# Patient Record
Sex: Female | Born: 1965 | Race: White | Hispanic: No | Marital: Married | Smoking: Never smoker
Health system: Southern US, Community
[De-identification: ages and names within clinical notes are randomized; demographics above are authoritative.]

## PROBLEM LIST (undated history)

## (undated) DIAGNOSIS — M545 Low back pain: Secondary | ICD-10-CM

## (undated) DIAGNOSIS — F32A Depression, unspecified: Secondary | ICD-10-CM

## (undated) DIAGNOSIS — R509 Fever, unspecified: Secondary | ICD-10-CM

## (undated) DIAGNOSIS — G4730 Sleep apnea, unspecified: Secondary | ICD-10-CM

## (undated) DIAGNOSIS — G709 Myoneural disorder, unspecified: Secondary | ICD-10-CM

## (undated) DIAGNOSIS — R0781 Pleurodynia: Secondary | ICD-10-CM

## (undated) DIAGNOSIS — R51 Headache: Secondary | ICD-10-CM

## (undated) DIAGNOSIS — E785 Hyperlipidemia, unspecified: Secondary | ICD-10-CM

## (undated) DIAGNOSIS — K529 Noninfective gastroenteritis and colitis, unspecified: Secondary | ICD-10-CM

## (undated) DIAGNOSIS — E669 Obesity, unspecified: Secondary | ICD-10-CM

## (undated) DIAGNOSIS — G629 Polyneuropathy, unspecified: Secondary | ICD-10-CM

## (undated) DIAGNOSIS — J189 Pneumonia, unspecified organism: Secondary | ICD-10-CM

## (undated) DIAGNOSIS — M1990 Unspecified osteoarthritis, unspecified site: Secondary | ICD-10-CM

## (undated) DIAGNOSIS — M14671 Charcot's joint, right ankle and foot: Secondary | ICD-10-CM

## (undated) DIAGNOSIS — R0609 Other forms of dyspnea: Secondary | ICD-10-CM

## (undated) DIAGNOSIS — E119 Type 2 diabetes mellitus without complications: Secondary | ICD-10-CM

## (undated) DIAGNOSIS — K219 Gastro-esophageal reflux disease without esophagitis: Secondary | ICD-10-CM

## (undated) DIAGNOSIS — L97509 Non-pressure chronic ulcer of other part of unspecified foot with unspecified severity: Secondary | ICD-10-CM

## (undated) DIAGNOSIS — R011 Cardiac murmur, unspecified: Secondary | ICD-10-CM

## (undated) DIAGNOSIS — D649 Anemia, unspecified: Secondary | ICD-10-CM

## (undated) HISTORY — DX: Low back pain: M54.5

## (undated) HISTORY — PX: REPLACEMENT TOTAL KNEE: SUR1224

## (undated) HISTORY — DX: Pleurodynia: R07.81

## (undated) HISTORY — PX: KNEE ARTHROSCOPY: SHX127

## (undated) HISTORY — DX: Depression, unspecified: F32.A

## (undated) HISTORY — DX: Other forms of dyspnea: R06.09

## (undated) HISTORY — DX: Fever, unspecified: R50.9

---

## 2001-06-24 ENCOUNTER — Other Ambulatory Visit: Admission: RE | Admit: 2001-06-24 | Discharge: 2001-06-24 | Payer: Self-pay | Admitting: Obstetrics & Gynecology

## 2001-06-30 ENCOUNTER — Encounter: Admission: RE | Admit: 2001-06-30 | Discharge: 2001-06-30 | Payer: Self-pay | Admitting: Obstetrics & Gynecology

## 2001-06-30 ENCOUNTER — Encounter: Payer: Self-pay | Admitting: Obstetrics & Gynecology

## 2002-01-25 ENCOUNTER — Other Ambulatory Visit: Admission: RE | Admit: 2002-01-25 | Discharge: 2002-01-25 | Payer: Self-pay | Admitting: Obstetrics and Gynecology

## 2002-04-07 ENCOUNTER — Ambulatory Visit (HOSPITAL_BASED_OUTPATIENT_CLINIC_OR_DEPARTMENT_OTHER): Admission: RE | Admit: 2002-04-07 | Discharge: 2002-04-07 | Payer: Self-pay | Admitting: Orthopaedic Surgery

## 2002-07-27 ENCOUNTER — Other Ambulatory Visit: Admission: RE | Admit: 2002-07-27 | Discharge: 2002-07-27 | Payer: Self-pay | Admitting: Obstetrics & Gynecology

## 2003-08-09 ENCOUNTER — Other Ambulatory Visit: Admission: RE | Admit: 2003-08-09 | Discharge: 2003-08-09 | Payer: Self-pay | Admitting: Obstetrics & Gynecology

## 2004-10-24 ENCOUNTER — Other Ambulatory Visit: Admission: RE | Admit: 2004-10-24 | Discharge: 2004-10-24 | Payer: Self-pay | Admitting: Obstetrics & Gynecology

## 2007-04-26 ENCOUNTER — Inpatient Hospital Stay (HOSPITAL_COMMUNITY): Admission: RE | Admit: 2007-04-26 | Discharge: 2007-04-29 | Payer: Self-pay | Admitting: Orthopaedic Surgery

## 2010-04-24 ENCOUNTER — Encounter (HOSPITAL_BASED_OUTPATIENT_CLINIC_OR_DEPARTMENT_OTHER)
Admission: RE | Admit: 2010-04-24 | Discharge: 2010-05-27 | Payer: Self-pay | Source: Home / Self Care | Attending: Internal Medicine | Admitting: Internal Medicine

## 2010-04-24 ENCOUNTER — Ambulatory Visit (HOSPITAL_COMMUNITY)
Admission: RE | Admit: 2010-04-24 | Discharge: 2010-04-24 | Payer: Self-pay | Source: Home / Self Care | Attending: Internal Medicine | Admitting: Internal Medicine

## 2010-05-01 ENCOUNTER — Ambulatory Visit (HOSPITAL_COMMUNITY)
Admission: RE | Admit: 2010-05-01 | Discharge: 2010-05-01 | Payer: Self-pay | Source: Home / Self Care | Attending: Internal Medicine | Admitting: Internal Medicine

## 2010-05-29 ENCOUNTER — Encounter (HOSPITAL_BASED_OUTPATIENT_CLINIC_OR_DEPARTMENT_OTHER): Payer: BC Managed Care – PPO | Attending: Internal Medicine

## 2010-05-29 DIAGNOSIS — L97509 Non-pressure chronic ulcer of other part of unspecified foot with unspecified severity: Secondary | ICD-10-CM | POA: Insufficient documentation

## 2010-05-29 DIAGNOSIS — Z8673 Personal history of transient ischemic attack (TIA), and cerebral infarction without residual deficits: Secondary | ICD-10-CM | POA: Insufficient documentation

## 2010-05-29 DIAGNOSIS — A4901 Methicillin susceptible Staphylococcus aureus infection, unspecified site: Secondary | ICD-10-CM | POA: Insufficient documentation

## 2010-05-29 DIAGNOSIS — M869 Osteomyelitis, unspecified: Secondary | ICD-10-CM | POA: Insufficient documentation

## 2010-05-29 DIAGNOSIS — Z96659 Presence of unspecified artificial knee joint: Secondary | ICD-10-CM | POA: Insufficient documentation

## 2010-06-26 ENCOUNTER — Encounter (HOSPITAL_BASED_OUTPATIENT_CLINIC_OR_DEPARTMENT_OTHER): Payer: BC Managed Care – PPO | Attending: Internal Medicine

## 2010-06-26 DIAGNOSIS — M869 Osteomyelitis, unspecified: Secondary | ICD-10-CM | POA: Insufficient documentation

## 2010-06-26 DIAGNOSIS — Z8673 Personal history of transient ischemic attack (TIA), and cerebral infarction without residual deficits: Secondary | ICD-10-CM | POA: Insufficient documentation

## 2010-06-26 DIAGNOSIS — Z96659 Presence of unspecified artificial knee joint: Secondary | ICD-10-CM | POA: Insufficient documentation

## 2010-06-26 DIAGNOSIS — A4901 Methicillin susceptible Staphylococcus aureus infection, unspecified site: Secondary | ICD-10-CM | POA: Insufficient documentation

## 2010-06-26 DIAGNOSIS — L97509 Non-pressure chronic ulcer of other part of unspecified foot with unspecified severity: Secondary | ICD-10-CM | POA: Insufficient documentation

## 2010-07-07 LAB — GLUCOSE, CAPILLARY: Glucose-Capillary: 127 mg/dL — ABNORMAL HIGH (ref 70–99)

## 2010-07-31 ENCOUNTER — Encounter (HOSPITAL_BASED_OUTPATIENT_CLINIC_OR_DEPARTMENT_OTHER): Payer: BC Managed Care – PPO | Attending: Internal Medicine

## 2010-07-31 DIAGNOSIS — Z8673 Personal history of transient ischemic attack (TIA), and cerebral infarction without residual deficits: Secondary | ICD-10-CM | POA: Insufficient documentation

## 2010-07-31 DIAGNOSIS — Z96659 Presence of unspecified artificial knee joint: Secondary | ICD-10-CM | POA: Insufficient documentation

## 2010-07-31 DIAGNOSIS — M869 Osteomyelitis, unspecified: Secondary | ICD-10-CM | POA: Insufficient documentation

## 2010-07-31 DIAGNOSIS — A4901 Methicillin susceptible Staphylococcus aureus infection, unspecified site: Secondary | ICD-10-CM | POA: Insufficient documentation

## 2010-07-31 DIAGNOSIS — L97509 Non-pressure chronic ulcer of other part of unspecified foot with unspecified severity: Secondary | ICD-10-CM | POA: Insufficient documentation

## 2010-09-09 NOTE — Op Note (Signed)
[name], [name]             ACCOUNT NO.:  [id number]   MEDICAL RECORD NO.:  [id number]          PATIENT TYPE:  INP   LOCATION:  2899                         FACILITY:  [facility]   PHYSICIAN:  Claude Manges. Whitfield, M.D.DATE OF BIRTH:  [birth year]   DATE OF PROCEDURE:  04/26/2007  DATE OF DISCHARGE:                               OPERATIVE REPORT   PREOPERATIVE DIAGNOSIS:  End-stage osteoarthritis, left knee.   POSTOPERATIVE DIAGNOSIS:  End-stage osteoarthritis, left knee.   PROCEDURE PERFORMED:  Left total knee replacement.   SURGEON:  Claude Manges. Cleophas Dunker, M.D.   ASSISTANT:  Arlys John D. Petrarca, P.A.-C.   ANESTHESIA:  General with supplemental femoral nerve block.   COMPLICATIONS:  None.   COMPONENTS:  DePuy LCS standard plus femoral component.  A #4 rotating  tibial tray with a 10-mm bridging bearing, a metal-backed three-peg  rotating patella.  All were secured with polymethylmethacrylate.   PROCEDURE:  With the patient comfortable on the operating table and  under general orotracheal anesthesia, nursing staff inserted a Foley  catheter.  Urine was clear.   A thigh tourniquet was applied to the left lower extremity.  The leg was  then prepped with Betadine scrub and DuraPrep from the tourniquet to the  ankle.  Sterile draping was performed.  With the extremity still  elevated, it was Esmarch exsanguinated with a proximal tourniquet at 350  mmHg.   A midline longitudinal incision was made centered about the patella,  extending from the superior pouch to the tibial tubercle via sharp  dissection.  The incision carried down to subcutaneous tissue.  There  was abundant adipose tissue which was incised with the Bovie to the  level of the deep capsule.  A medial parapatellar incision was made to  the deep capsule with the Bovie.  The joint was entered with a clear  yellow joint effusion.  Patella was everted 180 degrees, the knee flexed  to 90 degrees.  There were large  osteophytes along the medial and  lateral femoral condyle as well as the patella.  There was no varus or  valgus deformity.  There was loss of articular cartilage in the tibial  plateaus.   Osteophytes were removed.  Preoperatively, we had measured a standard  plus femoral component.  This was confirmed intraoperatively.  We  measured either a 3 or a 4 tibial tray.  A 4 was a perfect fit noted  intraoperatively.   The initial cut was made transversely along the proximal tibia using the  external guide with a 7-degree angle of posterior declination.  Subsequent cuts were then made on the femur using the femoral jigs.  We  used a 4-degree distal femoral valgus cut.  MCL and LCL remained intact.  A lamina spreader was inserted to remove ACL and PCL as well as medial  and lateral menisci.  Osteophytes were removed from the femoral condyles  posteriorly with a 1/2 inch curved osteotome.  There was a small Baker  cyst noted medially which was debrided.   The final cut was made on the femur, along the edges and also to insert  the two distal femoral peg cuts.   Retractors were then placed about the tibia.  The #4 tray was applied.  The center hole was made after securing the jig to the tibia.  Subsequent cuts were then made to accept the keel.   A 10-mm bridging bearing had been symmetrical in both flexion and  extension.  We inserted the 10-mm bridging bearing and then the trial  femur.  Through a full range of motion, there was no malrotation of the  tibia.  There was no opening with varus or valgus stress.  We had full  extension, negative anterior drawer sign.   Patella was repaired by removing 10 mm of bone leaving 13 mm of patella  thickness.  The tri-pegged jig was then applied, the holes made, the  trial patella was applied and through a full range of motion, was  perfectly stable.   Trial components were removed.  The joint was copiously irrigated with  the jet saline.   The  final components were then inserted with polymethylmethacrylate.  The tibia was applied first, placing cement into the center hole and  then impacting it.  The 10-mm bridging bearing was applied.  The  extraneous methacrylate was removed from around the tibia.   Femoral component was then impacted with polymethylmethacrylate.  As per  the tibia, extraneous methacrylate was removed.  The knee was placed in  extension.  We had excellent fit.  Any further extraneous methacrylate  was removed with the Cisco.   Patella was applied with methacrylate and patella jig.   After complete maturation and hardening of the methacrylate, the joint  was inspected.  There was no further extraneous methacrylate.  The joint  was again irrigated with saline solution.  The tourniquet was deflated.  Gross bleeders were Bovie coagulated.  The capsule was injected with  Marcaine with epinephrine to supplement the femoral nerve block.  A  Hemovac was inserted.  The deep capsule was closed with interrupted #1  Ethibond.  Superficial capsule closed with 0 and 2-0 Vicryl, skin closed  with skin clips.  Sterile bulky dressing was applied, followed by an Ace  bandage.   The patient was then returned to the post anesthesia recovery room in  satisfactory condition without complication.      Claude Manges. Cleophas Dunker, M.D.  Electronically Signed     PWW/MEDQ  D:  04/26/2007  T:  04/26/2007  Job:  [id number]

## 2010-09-12 NOTE — Op Note (Signed)
NAME:  [name], SLONIKER                       ACCOUNT NO.:  [id number]   MEDICAL RECORD NO.:  [id number]                   PATIENT TYPE:  AMB   LOCATION:  DSC                                  FACILITY:  [facility]   PHYSICIAN:  Claude Manges. Cleophas Dunker, M.D.            DATE OF BIRTH:  10-25-1965   DATE OF PROCEDURE:  04/07/2002  DATE OF DISCHARGE:                                 OPERATIVE REPORT   PREOPERATIVE DIAGNOSES:  1. Tear of medial meniscus, left knee.  2. Chondromalacia of medial compartment and patella, left knee.   POSTOPERATIVE DIAGNOSES:  1. Tear of medial meniscus, left knee.  2. Chondromalacia of medial compartment and patella, left knee.   PROCEDURES:  1. Diagnostic arthroscopy of the left knee.  2. Partial medial meniscectomy.  3. Shaving of medial femoral condyle and patella with removal of loose     osteocartilaginous loose bodies.   SURGEON:  Claude Manges. Cleophas Dunker, M.D.   ANESTHESIA:  IV sedation local with 1% Xylocaine with epinephrine.   COMPLICATIONS:  None.   HISTORY:  The patient is a 45 year-old female who has been having trouble  with her left knee for approximately three years.  It has been much worse in  the last six to seven weeks.  She is having clicking and snapping noises.  There is no history of injury or trauma.  On exam she has tenderness on the  medial joint with what may be a small effusion and she had x-rays which  reveal osteophytes along the medial lateral compartments.  She has had an  MRI scan revealing a radial type tear involving the posterior horn of the  medial meniscus as well as degenerative chondrosis involving the medial  femoral condyle and the patellofemoral joint.  There is a small joint  effusion and small Baker's cyst which may have partially ruptured.  She was  not having much discomfort in the popliteal space.   The patient is now to have arthroscopic evaluation.   PROCEDURE:  With the patient comfortable on the operating  table and under IV  sedation the left lower extremity was placed in a thigh holder.  The leg was  then prepped with Duraprep and the thigh holder to the ankle.  Sterile  draping was performed.   Diagnostic arthroscopy was performed using a medial and lateral parapatellar  tendon stab wound.  Diagnostic arthroscopy revealed a very small joint  effusion.  There was mild to moderate synovitis in the superior pouch and  diffuse chondromalacia of the patella involving both medial and lateral  central facets.  This area was debrided with a basket forceps and then  further stabilized with the ArthriCare wand.  There was a moderate amount of  synovitis surrounding the patella which was also debrided with the  ArthriCare wand. There were numerous small osteocartilaginous loose bodies  in the superior pouch which I removed and actually saw several larger  cartilaginous loose bodies within the medial compartment.  Some were  probably 4 to 5 mm in length and two to three in number.  The medial  compartment also revealed diffuse chondromalacia of the femoral condyle and  to a lesser extent the tibial plateau.  These areas were shaved of the loose  and unstable articular cartilage and stabilized with the ArthriCare wand in  the coagulation position.  There was also a horizontal cleavage tear of the  posterior horn of the medial meniscus and this was debrided with the basket  forceps and further debrided with the intra-articular shaver.  I did not see  explosive chondral bone.   The anterior cruciate ligament was intact although there was a synovitis and  this was debrided.  The lateral compartment was relatively clear of meniscal  pathology or chondromalacia.   The joint was then re-explored for loose material.  It was irrigated with  saline solution. The two stab wounds were left open and infiltrated with  0.25% Marcaine with epinephrine. A sterile bulky dressing was applied  followed by an ace  bandage.   PLAN:  Percocet for pain, aspirin a day and office follow-up in five to  seven days.                                               Claude Manges. Cleophas Dunker, M.D.    PWW/MEDQ  D:  04/07/2002  T:  04/08/2002  Job:  [id number]

## 2010-09-12 NOTE — Discharge Summary (Signed)
NAMEANGELIA, [name]             ACCOUNT NO.:  [id number]   MEDICAL RECORD NO.:  [id number]          PATIENT TYPE:  INP   LOCATION:  5028                         FACILITY:  [facility]   PHYSICIAN:  Claude Manges. Whitfield, M.D.DATE OF BIRTH:  [birth year]   DATE OF ADMISSION:  04/26/2007  DATE OF DISCHARGE:  04/29/2007                               DISCHARGE SUMMARY   ADMISSION DIAGNOSES:  1. End-stage osteoarthritis of bilateral knees, left worse than right.  2. Obesity.  3. Gout.  4. Urinary frequency.   DISCHARGE DIAGNOSES:  1. End-stage osteoarthritis, left knee, status post left total knee      arthroplasty.  2. Acute blood loss anemia secondary to surgery.  3. Pyrexia, now resolved.  4. Leukocytosis, now resolved.  5. Obesity.  6. Gout.  7. Urinary frequency.   PROCEDURE:  On April 26, 2007, [name] underwent a left total knee  arthroplasty by Dr. Claude Manges. Cleophas Dunker, assisted by [name], PA-  C.  She had a DePuy LCS complete RP insert, size standard plus, 10-mm  thickness placed with a LCS complete metal backed patella cemented, size  standard plus.  A DePuy NBT keel tibial tray cemented size 4 and a LCS  complete primary femoral component cemented, size standard plus left.   COMPLICATIONS:  None.   CONSULTATIONS:  1. Physical therapy on April 27, 2007.  2. Pharmacy consult for Coumadin therapy.   HISTORY OF PRESENT ILLNESS:  This 45 year old, white female patient  presented to Dr. Cleophas Dunker with a 6- to 7-year history of gradual onset  progressive bilateral knee pain, left worse than right.  Left knee pain  is a constant ache to throb and burning over the anterior joint without  radiation.  It increases with bad weather and stepping wrong, getting  out of bed and then decreases with Vicodin and diclofenac.  The knee  catches, grinds pops and keeps her up at night.  She has failed  conservative treatment and because of that, she is presenting for left  knee  replacement.   HOSPITAL COURSE:  [name] tolerated her surgical procedure well  without immediate postoperative complications.  She was transferred to  5000.  On postop day #1, T-max was 100.8.  Vitals were stable.  Hemoglobin was 9.6 and hematocrit 28.1.  Leg was neurovascularly intact.  She was transfused 1 unit of packed red blood cells, switched to p.o.  pain medications and started on therapy per protocol.   On postop day #2, her pain had been not well-controlled and medications  were adjusted.  T-max was 101.  Vitals were stable, other than pulse of  111.  Hemoglobin was 10.2, hematocrit 30 and white count had gone up to  10.1.  Dressing was changed.  The wound was well-approximated with  staples.  Chest x-ray, UA and C&S were obtained and her constipation was  treated.   On postop day #3, she was voiding well.  Pain was better controlled.  T-  max was 100.4.  Hemoglobin was 9.7, hematocrit 27.8 and white count down  to 8.1.  She did well enough with therapy  to be felt to be ready to be  discharged home.  Her wound had a little bit of erythema, so she was  started on Keflex 500 four times a day and treated with a Lovenox bridge  until her INR was therapeutic.  She was able to be discharged home.   DIET:  She is to resume her regular prehospitalization diet.   DISCHARGE MEDICATIONS:  1. She is to hold her diclofenac, Vicodin and tramadol at this time.  2. Allopurinol 300 mg p.o. q.a.m.  3. Vesicare 5 mg p.o. q.a.m.  4. Vitamin B12 1000 mcg p.o. b.i.d.  5. Chondroitin and glucosamine 2 p.o. q.a.m.  6. Zolpidem 10 mg p.o. nightly.  7. Climara 0.06 mg p.o. q.a.m. p.r.n.  8. Keflex 500 mg p.o. four times a day x5 days.  9. Robaxin 500 mg 1 tablet p.o. q.6-8 h. p.r.n. for spasms.  10.Percocet 5/325 1-2 tablets p.o. q.4-6 h. p.r.n. for pain.  11.OxyContin 20 mg 1 tablet p.o. q.12 h. p.r.n.  12.Lovenox 40 mg subcu at 8 a.m. until Coumadin therapeutic.  13.Coumadin, take as  directed by pharmacy 10 mg p.o. q.6 h. p.m.   ACTIVITY:  She can be out of bed 50% weightbearing on the left leg with  use of walker.  She is to have home CPM 0-90 degrees 6-8 hours a day and  home health PT.  She is to notify Dr. Cleophas Dunker of any difficulty with  ambulation.   WOUND CARE:  She is to keep the left knee wound clean and dry and change  the dressing daily.  She is to call the office with any signs of  infection.   FOLLOW UP:  She needs to follow up with Dr. Cleophas Dunker in our office in  11 days and needs to call [phone], for that appointment.   LABORATORY DATA AND X-RAY FINDINGS:  Chest x-ray on December 23, showed  no active cardiopulmonary disease.   Hemoglobin/hematocrit ranged from 12.6 and 37.4 on December 23, to 9.6  and 28.1 on December 31, to 9.7 and 27.8 on January 2.  White count  ranged from 9.1 on December 23, to 10.7 on January 1, and 8.1 on January  2.  INR only went to a high of 1.2 with a PT of 15 on January 2.  Sodium  went from 135 on December 23, to 132 on December 31, to 136 on January  2.  Glucose ranged from 100 on December 23, to 155 on December 31, to  138 on  January 2.  BUN and creatinine ranged from 10 and 0.7 on December 23, to  5 and  0.6 on January 2.  Calcium dropped to a low of 8.3 on December  31, and then went to 8.6 on January 2.  UA on January 1, showed moderate  hemoglobin, 0-2 wbc's, 7-10 rbc's, rare bacteria.  All other laboratory  studies were within normal limits.      Legrand Pitts Duffy, P.A.      Claude Manges. Cleophas Dunker, M.D.  Electronically Signed    KED/MEDQ  D:  07/01/2007  T:  07/02/2007  Job:  [id number]

## 2011-01-15 LAB — URINE CULTURE
Colony Count: NO GROWTH
Culture: NO GROWTH
Special Requests: NEGATIVE

## 2011-01-15 LAB — BASIC METABOLIC PANEL
BUN: 4 — ABNORMAL LOW
BUN: 5 — ABNORMAL LOW
CO2: 28
CO2: 29
Calcium: 8.5
Calcium: 8.6
Chloride: 101
Chloride: 101
Creatinine, Ser: 0.55
Creatinine, Ser: 0.6
GFR calc Af Amer: >60
GFR calc Af Amer: >60
GFR calc non Af Amer: >60
GFR calc non Af Amer: >60
Glucose, Bld: 138 — ABNORMAL HIGH
Glucose, Bld: 142 — ABNORMAL HIGH
Potassium: 3.6
Potassium: 4.2
Sodium: 136
Sodium: 136

## 2011-01-15 LAB — CBC
HCT: 27.8 — ABNORMAL LOW
HCT: 30 — ABNORMAL LOW
Hemoglobin: 10.2 — ABNORMAL LOW
Hemoglobin: 9.7 — ABNORMAL LOW
MCHC: 34.1
MCHC: 35
MCV: 84.1
MCV: 86
Platelets: 208
Platelets: 255
RBC: 3.3 — ABNORMAL LOW
RBC: 3.49 — ABNORMAL LOW
RDW: 14.1
RDW: 14.2
WBC: 10.7 — ABNORMAL HIGH
WBC: 8.1

## 2011-01-15 LAB — PROTIME-INR
INR: 1.1
INR: 1.2
Prothrombin Time: 14.5
Prothrombin Time: 15

## 2011-01-15 LAB — URINALYSIS, ROUTINE W REFLEX MICROSCOPIC
Bilirubin Urine: NEGATIVE
Glucose, UA: NEGATIVE
Ketones, ur: NEGATIVE
Leukocytes, UA: NEGATIVE
Nitrite: NEGATIVE
Protein, ur: NEGATIVE
Specific Gravity, Urine: 1.011
Urobilinogen, UA: 1
pH: 6

## 2011-01-15 LAB — URINE MICROSCOPIC-ADD ON

## 2011-01-30 LAB — URINE CULTURE: Colony Count: 30000

## 2011-01-30 LAB — COMPREHENSIVE METABOLIC PANEL
ALT: 15
AST: 13
Albumin: 3.6
Alkaline Phosphatase: 57
BUN: 10
CO2: 27
Calcium: 9.8
Chloride: 101
Creatinine, Ser: 0.7
GFR calc Af Amer: >60
GFR calc non Af Amer: >60
Glucose, Bld: 100 — ABNORMAL HIGH
Potassium: 4.3
Sodium: 135
Total Bilirubin: 0.8
Total Protein: 6.9

## 2011-01-30 LAB — URINALYSIS, ROUTINE W REFLEX MICROSCOPIC
Bilirubin Urine: NEGATIVE
Glucose, UA: NEGATIVE
Hgb urine dipstick: NEGATIVE
Ketones, ur: NEGATIVE
Nitrite: NEGATIVE
Protein, ur: NEGATIVE
Specific Gravity, Urine: 1.024
Urobilinogen, UA: 0.2
pH: 5.5

## 2011-01-30 LAB — CBC
HCT: 28.1 — ABNORMAL LOW
HCT: 37.4
Hemoglobin: 12.6
MCHC: 33.7
MCHC: 34.3
MCHC: 34.5
MCV: 85.6
MCV: 85.8
MCV: 85.8
Platelets: 209
Platelets: 230
Platelets: 305
RBC: 4.36
RDW: 14.1
RDW: 14.2
WBC: 9.1

## 2011-01-30 LAB — DIFFERENTIAL
Basophils Absolute: 0
Basophils Relative: 0
Eosinophils Absolute: 0.2
Eosinophils Relative: 2
Lymphocytes Relative: 24
Lymphs Abs: 2.2
Monocytes Absolute: 0.5
Monocytes Relative: 5
Neutro Abs: 6.2
Neutrophils Relative %: 68

## 2011-01-30 LAB — PROTIME-INR
INR: 0.9
Prothrombin Time: 12.7

## 2011-01-30 LAB — CROSSMATCH
ABO/RH(D): O POS
Antibody Screen: NEGATIVE

## 2011-01-30 LAB — BASIC METABOLIC PANEL
BUN: 5 — ABNORMAL LOW
CO2: 28
Calcium: 8.3 — ABNORMAL LOW
Chloride: 98
Creatinine, Ser: 0.61
GFR calc Af Amer: >60
GFR calc non Af Amer: >60
Glucose, Bld: 155 — ABNORMAL HIGH
Potassium: 3.8
Sodium: 132 — ABNORMAL LOW

## 2011-01-30 LAB — APTT: aPTT: 26

## 2011-01-30 LAB — HEMOGLOBIN AND HEMATOCRIT, BLOOD
HCT: 30.3 — ABNORMAL LOW
Hemoglobin: 10 — ABNORMAL LOW

## 2011-01-30 LAB — ABO/RH: ABO/RH(D): O POS

## 2011-06-08 ENCOUNTER — Other Ambulatory Visit: Payer: Self-pay | Admitting: *Deleted

## 2011-06-08 ENCOUNTER — Other Ambulatory Visit: Payer: Self-pay | Admitting: Orthopaedic Surgery

## 2011-06-08 DIAGNOSIS — M79675 Pain in left toe(s): Secondary | ICD-10-CM

## 2011-06-10 ENCOUNTER — Ambulatory Visit
Admission: RE | Admit: 2011-06-10 | Discharge: 2011-06-10 | Disposition: A | Discharge status: Home / Self Care | Payer: BC Managed Care – PPO | Source: Ambulatory Visit | Attending: Orthopaedic Surgery | Admitting: Orthopaedic Surgery

## 2011-06-10 DIAGNOSIS — M79675 Pain in left toe(s): Secondary | ICD-10-CM

## 2011-06-10 MED ORDER — GADOBENATE DIMEGLUMINE 529 MG/ML IV SOLN
20.0000 mL | Freq: Once | INTRAVENOUS | Status: AC | PRN
  Administered 2011-06-10: 20 mL via INTRAVENOUS

## 2011-06-15 ENCOUNTER — Encounter (HOSPITAL_COMMUNITY): Payer: Self-pay | Admitting: Pharmacy Technician

## 2011-06-18 ENCOUNTER — Encounter (HOSPITAL_COMMUNITY)
Admission: RE | Admit: 2011-06-18 | Discharge: 2011-06-18 | Disposition: A | Discharge status: Home / Self Care | Payer: BC Managed Care – PPO | Source: Ambulatory Visit | Attending: Orthopaedic Surgery | Admitting: Orthopaedic Surgery

## 2011-06-18 ENCOUNTER — Encounter (HOSPITAL_COMMUNITY)
Admission: RE | Admit: 2011-06-18 | Discharge: 2011-06-18 | Disposition: A | Discharge status: Home / Self Care | Payer: BC Managed Care – PPO | Source: Ambulatory Visit | Attending: Orthopedic Surgery | Admitting: Orthopedic Surgery

## 2011-06-18 ENCOUNTER — Encounter (HOSPITAL_COMMUNITY): Payer: Self-pay

## 2011-06-18 HISTORY — DX: Polyneuropathy, unspecified: G62.9

## 2011-06-18 HISTORY — DX: Headache: R51

## 2011-06-18 HISTORY — DX: Pneumonia, unspecified organism: J18.9

## 2011-06-18 HISTORY — DX: Unspecified osteoarthritis, unspecified site: M19.90

## 2011-06-18 HISTORY — DX: Non-pressure chronic ulcer of other part of unspecified foot with unspecified severity: L97.509

## 2011-06-18 LAB — BASIC METABOLIC PANEL
BUN: 11 mg/dL (ref 6–23)
CO2: 24 mEq/L (ref 19–32)
Calcium: 10.9 mg/dL — ABNORMAL HIGH (ref 8.4–10.5)
Creatinine, Ser: 0.55 mg/dL (ref 0.50–1.10)
GFR calc Af Amer: >90 mL/min (ref >90)

## 2011-06-18 LAB — URINALYSIS, MICROSCOPIC ONLY
Glucose, UA: NEGATIVE mg/dL
Ketones, ur: NEGATIVE mg/dL
Leukocytes, UA: NEGATIVE
Nitrite: NEGATIVE
Specific Gravity, Urine: 1.02 (ref 1.005–1.030)
pH: 5.5 (ref 5.0–8.0)

## 2011-06-18 LAB — TYPE AND SCREEN: Antibody Screen: NEGATIVE

## 2011-06-18 LAB — PROTIME-INR: Prothrombin Time: 13.1 seconds (ref 11.6–15.2)

## 2011-06-18 LAB — CBC
MCHC: 33.5 g/dL (ref 30.0–36.0)
Platelets: 276 10*3/uL (ref 150–400)
RDW: 15.2 % (ref 11.5–15.5)
WBC: 10.1 10*3/uL (ref 4.0–10.5)

## 2011-06-18 LAB — SURGICAL PCR SCREEN: Staphylococcus aureus: NEGATIVE

## 2011-06-18 MED ORDER — CHLORHEXIDINE GLUCONATE 4 % EX LIQD: 60.0000 mL | Freq: Every day | CUTANEOUS | Status: DC | PRN

## 2011-06-18 MED ORDER — CHLORHEXIDINE GLUCONATE 4 % EX LIQD: 60.0000 mL | Freq: Once | CUTANEOUS | Status: DC

## 2011-06-18 NOTE — Pre-Procedure Instructions (Signed)
20 Carlye R Feger  06/18/2011   Your procedure is scheduled on:  Tues, Feb 26 @ 0730  Report to [facility] at 0530 AM.  Call this number if you have problems the morning of surgery: [phone]   Remember:   Do not eat food:After Midnight.  May have clear liquids: up to 4 Hours before arrival.(until 1:30 am)  Clear liquids include soda, tea, black coffee, apple or grape juice, broth.  Take these medicines the morning of surgery with A SIP OF WATER: Cymbalta   Do not wear jewelry, make-up or nail polish.  Do not wear lotions, powders, or perfumes. You may wear deodorant.  Do not shave 48 hours prior to surgery.  Do not bring valuables to the hospital.  Contacts, dentures or bridgework may not be worn into surgery.  Leave suitcase in the car. After surgery it may be brought to your room.  For patients admitted to the hospital, checkout time is 11:00 AM the day of discharge.   Patients discharged the day of surgery will not be allowed to drive home.  Name and phone number of your driver:    Special Instructions: CHG Shower Use Special Wash: 1/2 bottle night before surgery and 1/2 bottle morning of surgery.   Please read over the following fact sheets that you were given: Pain Booklet, Coughing and Deep Breathing, Blood Transfusion Information, Total Joint Packet, MRSA Information and Surgical Site Infection Prevention

## 2011-06-22 MED ORDER — CEFAZOLIN SODIUM-DEXTROSE 2-3 GM-% IV SOLR
2.0000 g | INTRAVENOUS | Status: AC
  Administered 2011-06-23: 2 g via INTRAVENOUS
  Filled 2011-06-22: qty 50

## 2011-06-22 MED ORDER — ACETAMINOPHEN 10 MG/ML IV SOLN
1000.0000 mg | Freq: Once | INTRAVENOUS | Status: AC
  Administered 2011-06-23: 1000 mg via INTRAVENOUS
  Filled 2011-06-22: qty 100

## 2011-06-22 MED ORDER — POVIDONE-IODINE 7.5 % EX SOLN
Freq: Once | CUTANEOUS | Status: DC
  Filled 2011-06-22: qty 118

## 2011-06-22 MED ORDER — SODIUM CHLORIDE 0.9 % IV SOLN: INTRAVENOUS | Status: DC

## 2011-06-22 NOTE — H&P (Signed)
CHIEF COMPLAINT:  Painful right knee.  HISTORY:  [name] is a very pleasant 46 year old white female who is seen today for evaluation of her right knee. She states she's had about a 4 year history of right knee pain which unfortunately now is worsening to the point which it is now affecting her activities of daily living as well as her ability to walk. She's having to use an assistive device, that of a cane, for ambulation. She unfortunately has worsened to the point now where she states she's having severe right knee pain which is constant and that of a sharp, stabbing, throbbing, aching and burning quality. She's unable to sleep secondary to pain. She is using Vicodin 10/500 mg.  She has tried wearing a brace but this has not been very beneficial.    PAST MEDICAL HISTORY:  In general her health is good.   HOSPITALIZATIONS:  2007 for left knee arthroscopy and in December 2008 for a left total knee arthroplasty. She has had one childbirth in 1993.   MEDICATIONS:  Diclofenac 75 mg b.i.d. which has been stopped, Cymbalta 60 mg daily, Vicodin 5/325 mg 2 times a day. She also uses glucosamine, chondroitin, B100 complex, and multivitamin.   ALLERGIES:  No known drug allergies.  ROS:  Fourteen point review of systems is unremarkable except for glasses, urinary frequency depending upon how much she drinks, and hormonal migraines which are peri-menopausal and also associated with irregular menses.   FAMILY HISTORY:  Positive for mother, deceased age 68, from metastatic lung cancer. Father, who is alive at 73, and has had a previous stroke. She does have one brother is living who is age 4 has had gout. No sisters.   SOCIAL HISTORY:  She is a 46 year old white female who is married and employed with animal [employer]. She denies use tobacco or alcohol.   EXAMINATION:  Her exam today reveals a very pleasant 46 year old white female who is well-developed and well-nourished.  She is morbidly obese, alert, pleasant  and cooperative.  She is in moderate distress secondary to right knee pain. She is 5 foot 4 inches and weighs 305 pounds. BMI of 52.4. Vital signs reveal a temperature of 98.8, pulse 95, respirations 18, blood pressure 140/86.   Head is normocephalic. Eyes, ears, nose and throat were benign. Neck was supple; no bruits are noted. Chest had good expansion with lungs clear to auscultation. Cardiac had a regular rhythm and rate; normal S1, normal S2, no murmurs was noted. Abdomen is obese, soft, nontender and no masses palpable; normal bowel sounds present. Genital, rectal, and breast exam not indicated for the orthopaedic evaluation. CNS:  Oriented x3 and cranial nerves II through XII grossly intact. Musculoskeletal:  Range of motion from about 5 degrees to 100 degrees. There is crepitus with range of motion. It is difficult to tell if she has an effusion in the knee. She does have some pseudo-laxity with varus and valgus stressing. Diffuse tenderness about both joint lines. She has smooth motion of the hips though bilaterally.  The ulcer on her foot is dry and is noted to have the callus to the improving on the left great toe.   STUDIES:  X-rays today have been reviewed from 04/13/2011 and reveal tricompartmental OA worse in the patellofemoral and medial compartments.   CLINICAL IMPRESSION:   1. End-stage osteoarthritis of the right knee. 2. Status post left total knee arthroplasty. 3. Morbid obesity. 4. Peri-menopausal hormonal migraines. 5. Ulcer left great toe; healed.  RECOMMENDATIONS:  At this time I have reviewed the documentation from her primary care and they feel that her functional capacity is excellent and her surgery risk is intermediate risk without significant risk. At this time we will consider a total knee replacement on the right. Procedure risks and benefits were explained to her in detail. The model was used to the show her the prosthesis. All questions were answered. The  risks of course of infection would be higher in her because of her previous ulcer but her MRI scan of the left great toe which we just obtained on 02/015/2013 reveals a wound on the plantar surface of the great toe without osteomyelitis or abscess. Therefore we will proceed with scheduling her total knee arthroplasty. We'll send her over to the hospital for lab work and pending this we will hopefully proceed with a total joint replacement on the right.  Oris Drone Marl Seago, PA-C 06/22/2011 1:21 PM

## 2011-06-23 ENCOUNTER — Encounter (HOSPITAL_COMMUNITY): Payer: Self-pay | Admitting: *Deleted

## 2011-06-23 ENCOUNTER — Encounter (HOSPITAL_COMMUNITY)
Admission: RE | Disposition: A | Discharge status: Home Health | Payer: Self-pay | Source: Ambulatory Visit | Attending: Orthopaedic Surgery

## 2011-06-23 ENCOUNTER — Inpatient Hospital Stay (HOSPITAL_COMMUNITY)
Admission: RE | Admit: 2011-06-23 | Discharge: 2011-06-26 | DRG: 209 | Disposition: A | Discharge status: Home Health | Payer: BC Managed Care – PPO | Source: Ambulatory Visit | Attending: Orthopaedic Surgery | Admitting: Orthopaedic Surgery

## 2011-06-23 ENCOUNTER — Encounter (HOSPITAL_COMMUNITY): Payer: Self-pay | Admitting: Registered Nurse

## 2011-06-23 ENCOUNTER — Ambulatory Visit (HOSPITAL_COMMUNITY): Payer: BC Managed Care – PPO | Admitting: Registered Nurse

## 2011-06-23 DIAGNOSIS — Z79899 Other long term (current) drug therapy: Secondary | ICD-10-CM

## 2011-06-23 DIAGNOSIS — M179 Osteoarthritis of knee, unspecified: Secondary | ICD-10-CM | POA: Diagnosis present

## 2011-06-23 DIAGNOSIS — R739 Hyperglycemia, unspecified: Secondary | ICD-10-CM | POA: Diagnosis present

## 2011-06-23 DIAGNOSIS — D62 Acute posthemorrhagic anemia: Secondary | ICD-10-CM | POA: Diagnosis not present

## 2011-06-23 DIAGNOSIS — R7309 Other abnormal glucose: Secondary | ICD-10-CM | POA: Diagnosis present

## 2011-06-23 DIAGNOSIS — G43809 Other migraine, not intractable, without status migrainosus: Secondary | ICD-10-CM | POA: Diagnosis present

## 2011-06-23 DIAGNOSIS — Z6841 Body Mass Index (BMI) 40.0 and over, adult: Secondary | ICD-10-CM

## 2011-06-23 DIAGNOSIS — M1710 Unilateral primary osteoarthritis, unspecified knee: Principal | ICD-10-CM | POA: Diagnosis present

## 2011-06-23 HISTORY — PX: TOTAL KNEE ARTHROPLASTY: SHX125

## 2011-06-23 SURGERY — ARTHROPLASTY, KNEE, TOTAL
Anesthesia: General | Site: Knee | Laterality: Right | Wound class: Clean

## 2011-06-23 MED ORDER — SODIUM CHLORIDE 0.9 % IR SOLN
Status: DC | PRN
  Administered 2011-06-23: 3000 mL

## 2011-06-23 MED ORDER — LACTATED RINGERS IV SOLN
INTRAVENOUS | Status: DC | PRN
  Administered 2011-06-23 (×3): via INTRAVENOUS

## 2011-06-23 MED ORDER — KETOROLAC TROMETHAMINE 30 MG/ML IJ SOLN
INTRAMUSCULAR | Status: AC
  Filled 2011-06-23: qty 1

## 2011-06-23 MED ORDER — METOCLOPRAMIDE HCL 10 MG PO TABS: 5.0000 mg | ORAL_TABLET | Freq: Three times a day (TID) | ORAL | Status: DC | PRN

## 2011-06-23 MED ORDER — HYDROMORPHONE HCL PF 1 MG/ML IJ SOLN: 0.2500 mg | INTRAMUSCULAR | Status: DC | PRN

## 2011-06-23 MED ORDER — HYDROMORPHONE HCL PF 1 MG/ML IJ SOLN
INTRAMUSCULAR | Status: AC
  Administered 2011-06-23: 11:00:00
  Filled 2011-06-23: qty 1

## 2011-06-23 MED ORDER — PHENOL 1.4 % MT LIQD: 1.0000 | OROMUCOSAL | Status: DC | PRN

## 2011-06-23 MED ORDER — MENTHOL 3 MG MT LOZG: 1.0000 | LOZENGE | OROMUCOSAL | Status: DC | PRN

## 2011-06-23 MED ORDER — ONDANSETRON HCL 4 MG/2ML IJ SOLN: 4.0000 mg | Freq: Once | INTRAMUSCULAR | Status: DC | PRN

## 2011-06-23 MED ORDER — ONDANSETRON HCL 4 MG/2ML IJ SOLN
4.0000 mg | Freq: Four times a day (QID) | INTRAMUSCULAR | Status: DC | PRN
Start: 2011-06-23 — End: 2011-06-24

## 2011-06-23 MED ORDER — METHOCARBAMOL 100 MG/ML IJ SOLN
500.0000 mg | Freq: Four times a day (QID) | INTRAVENOUS | Status: DC | PRN
  Administered 2011-06-23: 500 mg via INTRAVENOUS
  Filled 2011-06-23: qty 5

## 2011-06-23 MED ORDER — PROPOFOL 10 MG/ML IV EMUL
INTRAVENOUS | Status: DC | PRN
  Administered 2011-06-23: 100 mg via INTRAVENOUS
  Administered 2011-06-23: 200 mg via INTRAVENOUS

## 2011-06-23 MED ORDER — ACETAMINOPHEN 10 MG/ML IV SOLN
INTRAVENOUS | Status: AC
  Filled 2011-06-23: qty 100

## 2011-06-23 MED ORDER — DIPHENHYDRAMINE HCL 50 MG/ML IJ SOLN: 12.5000 mg | Freq: Four times a day (QID) | INTRAMUSCULAR | Status: DC | PRN

## 2011-06-23 MED ORDER — SODIUM CHLORIDE 0.9 % IJ SOLN: 9.0000 mL | INTRAMUSCULAR | Status: DC | PRN

## 2011-06-23 MED ORDER — METHOCARBAMOL 500 MG PO TABS
500.0000 mg | ORAL_TABLET | Freq: Four times a day (QID) | ORAL | Status: DC | PRN
  Administered 2011-06-23 – 2011-06-24 (×2): 500 mg via ORAL
  Filled 2011-06-23 (×4): qty 1

## 2011-06-23 MED ORDER — ZOLPIDEM TARTRATE 5 MG PO TABS: 5.0000 mg | ORAL_TABLET | Freq: Every evening | ORAL | Status: DC | PRN

## 2011-06-23 MED ORDER — ROCURONIUM BROMIDE 100 MG/10ML IV SOLN
INTRAVENOUS | Status: DC | PRN
  Administered 2011-06-23: 40 mg via INTRAVENOUS
  Administered 2011-06-23: 10 mg via INTRAVENOUS

## 2011-06-23 MED ORDER — HYDROMORPHONE 0.3 MG/ML IV SOLN
INTRAVENOUS | Status: AC
  Filled 2011-06-23: qty 25

## 2011-06-23 MED ORDER — SODIUM CHLORIDE 0.9 % IV SOLN
INTRAVENOUS | Status: DC
  Administered 2011-06-23: 1000 mL via INTRAVENOUS
  Administered 2011-06-24: 04:00:00 via INTRAVENOUS

## 2011-06-23 MED ORDER — GLYCOPYRROLATE 0.2 MG/ML IJ SOLN
INTRAMUSCULAR | Status: DC | PRN
  Administered 2011-06-23: 0.2 mg via INTRAVENOUS

## 2011-06-23 MED ORDER — DOCUSATE SODIUM 100 MG PO CAPS
100.0000 mg | ORAL_CAPSULE | Freq: Two times a day (BID) | ORAL | Status: DC
  Administered 2011-06-23 – 2011-06-26 (×7): 100 mg via ORAL
  Filled 2011-06-23 (×10): qty 1

## 2011-06-23 MED ORDER — BUPIVACAINE-EPINEPHRINE 0.25% -1:200000 IJ SOLN
INTRAMUSCULAR | Status: DC | PRN
  Administered 2011-06-23: 30 mL

## 2011-06-23 MED ORDER — RIVAROXABAN 10 MG PO TABS
10.0000 mg | ORAL_TABLET | ORAL | Status: DC
  Administered 2011-06-23 – 2011-06-25 (×3): 10 mg via ORAL
  Filled 2011-06-23 (×4): qty 1

## 2011-06-23 MED ORDER — DIPHENHYDRAMINE HCL 12.5 MG/5ML PO ELIX: 12.5000 mg | ORAL_SOLUTION | Freq: Four times a day (QID) | ORAL | Status: DC | PRN

## 2011-06-23 MED ORDER — KETOROLAC TROMETHAMINE 30 MG/ML IJ SOLN
30.0000 mg | Freq: Four times a day (QID) | INTRAMUSCULAR | Status: DC
  Administered 2011-06-23: 30 mg via INTRAVENOUS

## 2011-06-23 MED ORDER — HYDROMORPHONE 0.3 MG/ML IV SOLN
INTRAVENOUS | Status: DC
  Administered 2011-06-23: 2.4 mg via INTRAVENOUS
  Administered 2011-06-23: 11:00:00 via INTRAVENOUS
  Administered 2011-06-23: 1 mg via INTRAVENOUS
  Administered 2011-06-23: 22:00:00 via INTRAVENOUS
  Administered 2011-06-24: 4.2 mg via INTRAVENOUS

## 2011-06-23 MED ORDER — ONDANSETRON HCL 4 MG/2ML IJ SOLN
INTRAMUSCULAR | Status: DC | PRN
  Administered 2011-06-23: 4 mg via INTRAVENOUS

## 2011-06-23 MED ORDER — OXYCODONE HCL 5 MG PO TABS
5.0000 mg | ORAL_TABLET | ORAL | Status: DC | PRN
  Administered 2011-06-23 – 2011-06-26 (×16): 10 mg via ORAL
  Filled 2011-06-23 (×16): qty 2

## 2011-06-23 MED ORDER — ONDANSETRON HCL 4 MG/2ML IJ SOLN: 4.0000 mg | Freq: Four times a day (QID) | INTRAMUSCULAR | Status: DC | PRN

## 2011-06-23 MED ORDER — DULOXETINE HCL 60 MG PO CPEP
60.0000 mg | ORAL_CAPSULE | Freq: Every day | ORAL | Status: DC
  Administered 2011-06-23 – 2011-06-26 (×4): 60 mg via ORAL
  Filled 2011-06-23 (×5): qty 1

## 2011-06-23 MED ORDER — BISACODYL 10 MG RE SUPP: 10.0000 mg | Freq: Every day | RECTAL | Status: DC | PRN

## 2011-06-23 MED ORDER — METOCLOPRAMIDE HCL 5 MG/ML IJ SOLN
5.0000 mg | Freq: Three times a day (TID) | INTRAMUSCULAR | Status: DC | PRN
Start: 2011-06-23 — End: 2011-06-26

## 2011-06-23 MED ORDER — NEOSTIGMINE METHYLSULFATE 1 MG/ML IJ SOLN
INTRAMUSCULAR | Status: DC | PRN
  Administered 2011-06-23: 2 mg via INTRAVENOUS

## 2011-06-23 MED ORDER — ONDANSETRON HCL 4 MG PO TABS: 4.0000 mg | ORAL_TABLET | Freq: Four times a day (QID) | ORAL | Status: DC | PRN

## 2011-06-23 MED ORDER — MIDAZOLAM HCL 5 MG/5ML IJ SOLN
INTRAMUSCULAR | Status: DC | PRN
  Administered 2011-06-23: 2 mg via INTRAVENOUS

## 2011-06-23 MED ORDER — NALOXONE HCL 0.4 MG/ML IJ SOLN: 0.4000 mg | INTRAMUSCULAR | Status: DC | PRN

## 2011-06-23 MED ORDER — DEXAMETHASONE SODIUM PHOSPHATE 10 MG/ML IJ SOLN
INTRAMUSCULAR | Status: DC | PRN
  Administered 2011-06-23: 8 mg via INTRAVENOUS

## 2011-06-23 MED ORDER — FENTANYL CITRATE 0.05 MG/ML IJ SOLN
INTRAMUSCULAR | Status: DC | PRN
  Administered 2011-06-23 (×2): 50 ug via INTRAVENOUS
  Administered 2011-06-23 (×3): 100 ug via INTRAVENOUS
  Administered 2011-06-23: 50 ug via INTRAVENOUS

## 2011-06-23 MED ORDER — ACETAMINOPHEN 10 MG/ML IV SOLN
1000.0000 mg | Freq: Four times a day (QID) | INTRAVENOUS | Status: AC
Start: 2011-06-23 — End: 2011-06-24
  Administered 2011-06-23 – 2011-06-24 (×4): 1000 mg via INTRAVENOUS
  Filled 2011-06-23 (×4): qty 100

## 2011-06-23 SURGICAL SUPPLY — 60 items
BANDAGE ESMARK 6X9 LF (GAUZE/BANDAGES/DRESSINGS) ×1 IMPLANT
BLADE SAGITTAL 25.0X1.19X90 (BLADE) ×2 IMPLANT
BNDG ESMARK 6X9 LF (GAUZE/BANDAGES/DRESSINGS) ×2
BONE CEMENT GENTAMICIN (Cement) ×4 IMPLANT
BOWL SMART MIX CTS (DISPOSABLE) ×2 IMPLANT
CEMENT BONE GENTAMICIN 40 (Cement) ×2 IMPLANT
CEMENT HV SMART SET (Cement) IMPLANT
CLOTH BEACON ORANGE TIMEOUT ST (SAFETY) ×2 IMPLANT
COVER BACK TABLE 24X17X13 BIG (DRAPES) ×2 IMPLANT
COVER SURGICAL LIGHT HANDLE (MISCELLANEOUS) ×2 IMPLANT
CUFF TOURNIQUET SINGLE 34IN LL (TOURNIQUET CUFF) IMPLANT
CUFF TOURNIQUET SINGLE 44IN (TOURNIQUET CUFF) IMPLANT
DRAPE EXTREMITY T 121X128X90 (DRAPE) ×2 IMPLANT
DRAPE PROXIMA HALF (DRAPES) ×2 IMPLANT
DRSG ADAPTIC 3X8 NADH LF (GAUZE/BANDAGES/DRESSINGS) ×2 IMPLANT
DRSG PAD ABDOMINAL 8X10 ST (GAUZE/BANDAGES/DRESSINGS) ×4 IMPLANT
DURAPREP 26ML APPLICATOR (WOUND CARE) ×2 IMPLANT
ELECT CAUTERY BLADE 6.4 (BLADE) ×2 IMPLANT
ELECT REM PT RETURN 9FT ADLT (ELECTROSURGICAL) ×2
ELECTRODE REM PT RTRN 9FT ADLT (ELECTROSURGICAL) ×1 IMPLANT
EVACUATOR 1/8 PVC DRAIN (DRAIN) ×2 IMPLANT
FACESHIELD LNG OPTICON STERILE (SAFETY) ×4 IMPLANT
FLOSEAL 10ML (HEMOSTASIS) IMPLANT
GLOVE BIOGEL PI IND STRL 8 (GLOVE) ×1 IMPLANT
GLOVE BIOGEL PI IND STRL 8.5 (GLOVE) ×1 IMPLANT
GLOVE BIOGEL PI INDICATOR 8 (GLOVE) ×1
GLOVE BIOGEL PI INDICATOR 8.5 (GLOVE) ×1
GLOVE ECLIPSE 8.0 STRL XLNG CF (GLOVE) ×6 IMPLANT
GLOVE SURG ORTHO 8.5 STRL (GLOVE) ×6 IMPLANT
GOWN PREVENTION PLUS XLARGE (GOWN DISPOSABLE) ×2 IMPLANT
GOWN STRL NON-REIN LRG LVL3 (GOWN DISPOSABLE) ×4 IMPLANT
HANDPIECE INTERPULSE COAX TIP (DISPOSABLE) ×1
KIT BASIN OR (CUSTOM PROCEDURE TRAY) ×2 IMPLANT
KIT ROOM TURNOVER OR (KITS) ×2 IMPLANT
MANIFOLD NEPTUNE II (INSTRUMENTS) ×2 IMPLANT
MARKER SPHERE PSV REFLC THRD 5 (MARKER) IMPLANT
NEEDLE 22X1 1/2 (OR ONLY) (NEEDLE) IMPLANT
NS IRRIG 1000ML POUR BTL (IV SOLUTION) ×2 IMPLANT
PACK TOTAL JOINT (CUSTOM PROCEDURE TRAY) ×2 IMPLANT
PAD ARMBOARD 7.5X6 YLW CONV (MISCELLANEOUS) ×4 IMPLANT
PAD CAST 4YDX4 CTTN HI CHSV (CAST SUPPLIES) ×1 IMPLANT
PADDING CAST COTTON 4X4 STRL (CAST SUPPLIES) ×1
PADDING CAST COTTON 6X4 STRL (CAST SUPPLIES) ×2 IMPLANT
PIN SCHANZ 4MM 130MM (PIN) IMPLANT
SET HNDPC FAN SPRY TIP SCT (DISPOSABLE) ×1 IMPLANT
SPONGE GAUZE 4X4 12PLY (GAUZE/BANDAGES/DRESSINGS) ×2 IMPLANT
STAPLER VISISTAT 35W (STAPLE) ×2 IMPLANT
SUCTION FRAZIER TIP 10 FR DISP (SUCTIONS) ×2 IMPLANT
SUT BONE WAX W31G (SUTURE) ×2 IMPLANT
SUT ETHIBOND NAB CT1 #1 30IN (SUTURE) ×6 IMPLANT
SUT MNCRL AB 3-0 PS2 18 (SUTURE) ×2 IMPLANT
SUT VIC AB 0 CT1 27 (SUTURE) ×1
SUT VIC AB 0 CT1 27XBRD ANBCTR (SUTURE) ×1 IMPLANT
SUT VIC AB 1 CT1 27 (SUTURE)
SUT VIC AB 1 CT1 27XBRD ANBCTR (SUTURE) IMPLANT
SYR CONTROL 10ML LL (SYRINGE) IMPLANT
TOWEL OR 17X24 6PK STRL BLUE (TOWEL DISPOSABLE) ×2 IMPLANT
TOWEL OR 17X26 10 PK STRL BLUE (TOWEL DISPOSABLE) ×2 IMPLANT
TRAY FOLEY CATH 14FR (SET/KITS/TRAYS/PACK) IMPLANT
WATER STERILE IRR 1000ML POUR (IV SOLUTION) ×6 IMPLANT

## 2011-06-23 NOTE — Progress Notes (Signed)
Patient ID: [name], female   DOB: 02-25-1966, 46 y.o.   MRN: [id number] PATIENT ID:      [name]  MRN:     [id number] DOB/AGE:    1965/11/07 / 46 y.o.       OPERATIVE REPORT    DATE OF PROCEDURE:  06/23/2011       PREOPERATIVE DIAGNOSIS:   OSTEOARTHRITIS RIGHT KNEE (715.16)                                                         BMI 52.83  POSTOPERATIVE DIAGNOSIS:   osteoarthritis right knee                                                                     BMI 52.83     PROCEDURE:  Procedure(s):RIGHT TOTAL KNEE ARTHROPLASTY     SURGEON: Harnoor Reta W    ASSISTANT:   [name], PA-C   (Present and scrubbed throughout the case, critical for assistance with exposure, retraction, instrumentation, and closure.)          ANESTHESIA: General      DRAINS: Hemovac:      TOURNIQUET TIME:  Total Tourniquet Time Documented: Thigh (Right) - 96 minutes    COMPLICATIONS:  None   CONDITION:  stable  PROCEDURE IN DETAIL: dictated 562130   [name] 06/23/2011, 9:53 AM

## 2011-06-23 NOTE — Preoperative (Signed)
Beta Blockers   Reason not to administer Beta Blockers:Not Applicable 

## 2011-06-23 NOTE — OR Nursing (Signed)
cpm placed now 0-60 and tolarating well / also tolaerating po fluids(255mls)

## 2011-06-23 NOTE — Anesthesia Postprocedure Evaluation (Signed)
  Anesthesia Post-op Note  Patient: [name]  Procedure(s) Performed: Procedure(s) (LRB): TOTAL KNEE ARTHROPLASTY (Right)  Patient Location: PACU  Anesthesia Type: General  Level of Consciousness: awake, alert  and oriented  Airway and Oxygen Therapy: Patient Spontanous Breathing and Patient connected to nasal cannula oxygen  Post-op Pain: mild  Post-op Assessment: Post-op Vital signs reviewed and Patient's Cardiovascular Status Stable  Post-op Vital Signs: stable  Complications: No apparent anesthesia complications

## 2011-06-23 NOTE — Plan of Care (Signed)
Problem: Consults Goal: Diagnosis- Total Joint Replacement Outcome: Progressing Right Total Knee Hemiarthroplasty

## 2011-06-23 NOTE — Progress Notes (Signed)
Orthopedic Tech Progress Note Patient Details:  [name] [birth year] [id number]  CPM Right Knee CPM Right Knee: On Right Knee Flexion (Degrees): 60  Right Knee Extension (Degrees): 0   CPM applied at 11:20a. Gaye Pollack 06/23/2011, 11:28 AM

## 2011-06-23 NOTE — Interval H&P Note (Signed)
History and Physical Interval Note:  06/23/2011 7:31 AM  [name]  has presented today for surgery, with the diagnosis of OSTEOARTHRITIS RIGHT KNEE (715.16)  The various methods of treatment have been discussed with the patient and family. After consideration of risks, benefits and other options for treatment, the patient has consented to  Procedure(s) (LRB): TOTAL KNEE ARTHROPLASTY (Right) as a surgical intervention .  The patients' history has been reviewed, patient examined, no change in status, stable for surgery.  I have reviewed the patients' chart and labs.  Questions were answered to the patient's satisfaction.     [name] W

## 2011-06-23 NOTE — Anesthesia Preprocedure Evaluation (Addendum)
Anesthesia Evaluation  Patient identified by MRN, date of birth, ID band Patient awake    Reviewed: Allergy & Precautions, H&P , NPO status , Patient's Chart, lab work & pertinent test results, reviewed documented beta blocker date and time   Airway Mallampati: II TM Distance: >3 FB Neck ROM: full    Dental  (+) Teeth Intact   Pulmonary  clear to auscultation        Cardiovascular Regular Normal    Neuro/Psych  Headaches, Neuropathy in both feet  Neuromuscular disease    GI/Hepatic   Endo/Other  Morbid obesity  Renal/GU      Musculoskeletal   Abdominal   Peds  Hematology   Anesthesia Other Findings   Reproductive/Obstetrics                         Anesthesia Physical Anesthesia Plan  ASA: III  Anesthesia Plan: General   Post-op Pain Management:    Induction: Intravenous  Airway Management Planned: LMA  Additional Equipment:   Intra-op Plan:   Post-operative Plan: Extubation in OR  Informed Consent: I have reviewed the patients History and Physical, chart, labs and discussed the procedure including the risks, benefits and alternatives for the proposed anesthesia with the patient or authorized representative who has indicated his/her understanding and acceptance.   Dental advisory given  Plan Discussed with: CRNA and Surgeon  Anesthesia Plan Comments:        Anesthesia Quick Evaluation

## 2011-06-23 NOTE — Pre-Procedure Instructions (Signed)
There has been no change in health status since  the current H&P.I have examined the patient and discussed the surgery. No contraindications to the planned procedure exist.  

## 2011-06-23 NOTE — Transfer of Care (Signed)
Immediate Anesthesia Transfer of Care Note  Patient: [name]  Procedure(s) Performed: Procedure(s) (LRB): TOTAL KNEE ARTHROPLASTY (Right)  Patient Location: PACU  Anesthesia Type: General  Level of Consciousness: awake and alert   Airway & Oxygen Therapy: Patient Spontanous Breathing and Patient connected to face mask oxygen  Post-op Assessment: Report given to PACU RN  Post vital signs: Reviewed  Complications: No apparent anesthesia complications

## 2011-06-23 NOTE — Op Note (Signed)
Patient ID: Vlasta R Spade, female   DOB: 12/11/1965, 45 y.o.   MRN: [id number] PATIENT ID:      Devine R Rasool  MRN:     [id number] DOB/AGE:    01/07/1966 / 45 y.o.       OPERATIVE REPORT    DATE OF PROCEDURE:  06/23/2011       PREOPERATIVE DIAGNOSIS:   OSTEOARTHRITIS RIGHT KNEE (715.16)                                                         BMI 52.83  POSTOPERATIVE DIAGNOSIS:   osteoarthritis right knee                                                                     BMI 52.83     PROCEDURE:  Procedure(s):RIGHT TOTAL KNEE ARTHROPLASTY     SURGEON: Rylee Nuzum W    ASSISTANT:   Brian Petrarca, PA-C   (Present and scrubbed throughout the case, critical for assistance with exposure, retraction, instrumentation, and closure.)          ANESTHESIA: General      DRAINS: Hemovac:      TOURNIQUET TIME:  Total Tourniquet Time Documented: Thigh (Right) - 96 minutes    COMPLICATIONS:  None   CONDITION:  stable  PROCEDURE IN DETAIL: dictated [id number]   Yelena Metzer W 06/23/2011, 9:53 AM   

## 2011-06-23 NOTE — Op Note (Signed)
NAMERADHA, COGGINS             ACCOUNT NO.:  [id number]  MEDICAL RECORD NO.:  [id number]  LOCATION:  5031                         FACILITY:  [facility]  PHYSICIAN:  Claude Manges. Vivian Neuwirth, M.D.DATE OF BIRTH:  20-Mar-1966  DATE OF PROCEDURE: DATE OF DISCHARGE:                              OPERATIVE REPORT   PREOPERATIVE DIAGNOSES: 1. End-stage osteoarthritis, right knee. 2. Morbid obesity with body mass index 52.83.  POSTOPERATIVE DIAGNOSES: 1. End-stage osteoarthritis, right knee. 2. Morbid obesity with body mass index 52.83.  PROCEDURE:  Right total knee replacement.  SURGEON:  Claude Manges. Cleophas Dunker, MD  ASSISTANT:  Oris Drone. Petrarca, PA-C  ANESTHESIA:  General with supplemental femoral nerve block.  COMPLICATIONS:  None.  COMPONENTS:  DePuy LCS standard plus femoral component, a MBT rotating platform tibial tray with a 10-mm bridging bearing, a 3-peg metal back rotating patella, all was secured with the DePuy Polymethyl Methacrylate GHV Gentamicin bone cement.  PROCEDURE:  Ms. Summer was met in the holding area, identified the right knee as the appropriate operative site.  She did receive a preoperative femoral nerve block by Anesthesia.  The patient was then transported to room number 1 where she was placed under general anesthesia without difficulty.  Nursing staff inserted a Foley catheter and urine was clear.  Tourniquet was then applied to the right lower extremity.  The leg was prepped with Betadine scrub and DuraPrep from the tourniquet to the midfoot, sterile draping was performed.  With the extremity still elevated, Esmarch exsanguinated with a proximal tourniquet at 350 mmHg.  A midline longitudinal incision was made extending from the superior pouch to tibial tubercle.  Via sharp dissection, incision carried down to subcutaneous tissue.  There was abundant adipose tissue.  Because of the patient's size, the procedure was considerably more difficult and tourniquet  time was lengthened by approximately 30 minutes.  The deep capsule was then identified and was incised with the Bovie in a medial parapatellar region.  Joint was entered.  There was a clear yellow joint effusion.  The patella was everted to 180 degrees laterally.  The knee was flexed to 90 degrees.  There were large osteophytes along the medial and lateral femoral condyle with medial tibial plateau.  These were removed. I then was able to measure of standard plus femoral component. Synovectomy was performed.  There were diffuse areas of grade 3 chondromalacia in the areas of exposed subchondral bone.  On the medial compartment, there were 4 or 5 ostial cartilaginous loose bodies.  The largest measured about 1 cm in diameter.  A medial release was performed.  The patient had a fixed varus deformity.  Because of the patient's size, I used a MBT revision tibia.  The external guide was then applied to the tibia and an osteotomy was made along the proximal tibia with a 2-degree angle of declination. Subsequent cuts were then made on the femur using the standard plus femoral guide and a 4-degree distal femoral valgus cut.  MCL and LCL remained intact, 10 mm flexion and extension gaps were perfectly symmetrical.  Lamina spreaders were then inserted along the medial and lateral compartments.  For better visualization, I removed the medial and lateral menisci  as well as ACL and PCL.  A 3/4-inch curved osteotome was used to remove osteophytes in the medial and lateral femoral condyles.  Finishing cut was then made on the femur for tapering and to make the center holes.  Retractors were then placed around the tibia, measured a #4 tibial MBT tray.  The center hole was made followed by the keeled cut.  With the tibial trial jig in place, the 10-mm bridging bearing was then inserted followed by the standard plus femoral component.  Through a full range of motion with excellent motion, there was  no malrotation of the tibial component.  The patella was prepared by removing 10 mm of bone leaving 13 mm patella thickness, 3-peg jig was applied.  The 3 holes were made and the trial patella was inserted and reduced, and with a full range of motion, it was perfectly stable.  Trial components were then removed.  The joint was copiously irrigated with saline solution.  The final components were then inserted using DePuy SmartSet GHV Gentamicin bone cement.  We initially inserted the tibia followed by the 10-mm bridging bearing and the standard plus femoral component.  The components were impacted and extraneous methacrylate was removed from around the periphery.  Patella was applied with the same cement and a patellar clamp.  After approximately 15 minutes, the methacrylate had hardened, matured, through a full range.  The components were then evaluated.  We had full extension and flexion, no opening with varus or valgus stress.  The deep capsule was infiltrated with 0.25% Marcaine with epinephrine. The tourniquet was deflated an 1 and 36 minutes.  Gross bleeders were Bovie coagulated.  We had immediate capillary refill to the joint surface.  Hemovac was inserted.  The deep capsule was closed with interrupted #1 Ethibond.  Superficial capsule was closed with running 0 Vicryl subcu and several layers with 0 and 2-0 Vicryl and 3-0 Monocryl, skin closed with skin clips.  A sterile bulky dressing was applied, followed by the patient's support stocking.  The patient tolerated the procedure well without complications.     Claude Manges. Cleophas Dunker, M.D.     PWW/MEDQ  D:  06/23/2011  T:  06/23/2011  Job:  [id number]

## 2011-06-23 NOTE — Op Note (Deleted)
NAMECABELA, [name]             ACCOUNT NO.:  [id number]  MEDICAL RECORD NO.:  [id number]  LOCATION:  5031                         FACILITY:  [facility]  PHYSICIAN:  Claude Manges. Aishah Teffeteller, M.D.DATE OF BIRTH:  1965-07-01  DATE OF PROCEDURE:  06/23/2011 DATE OF DISCHARGE:                              OPERATIVE REPORT   PREOPERATIVE DIAGNOSES: 1. End-stage osteoarthritis, right knee. 2. Morbid obesity with body mass index 52.83.  POSTOPERATIVE DIAGNOSES: 1. End-stage osteoarthritis, right knee. 2. Morbid obesity with body mass index 52.83.  PROCEDURE:  Right total knee replacement.  SURGEON:  Claude Manges. Cleophas Dunker, MD  ASSISTANT:  Oris Drone. Petrarca, PA-C  ANESTHESIA:  General with supplemental femoral nerve block.  COMPLICATIONS:  None.  COMPONENTS:  DePuy standard plus LCS femoral component, a #4 MBT revision rotating platform tibial tray, a 10-mm polyethylene bridging bearing, a 3-peg metal back rotating patella, all was secured with the SmartSet GHV Gentamicin bone cement.  PROCEDURE:  Ms. Marceaux was met in the holding area, identified the right knee as the appropriate operative site and marked it.  She did receive a preoperative femoral nerve block.  The patient was then transported room number 1 without complication and placed under general anesthesia. Nursing staff inserted a Foley catheter and urine was clear.  Tourniquet was applied to the right thigh.  The right leg was then prepped with Betadine scrub and DuraPrep from the tourniquet to the midfoot.  Sterile draping was performed.  With the extremity still elevated, was Esmarch exsanguinated with a proximal tourniquet at 350 mmHg.  A midline longitudinal incision was made centered about the patella extending from the superior pouch to tibial tubercle.  Via sharp dissection, incision carried down to subcutaneous tissue.  There was abundant adipose tissue, and because of the patient's size, the procedure was concerning  more difficult and it was at least 30 minutes longer in urination.  Deep capsule was incised medially with the Bovie.  The joint was entered.  There was a clear yellow joint effusion.  The patella was everted 180 degrees laterally.  Patient had a fixed varus deformity. Medial release was performed without difficulty.  There were large osteophytes along the medial and lateral femoral condyles.  It will be grade 3 chondromalacia in all 3 compartments with a few areas of exposed subchondral bone.  Osteophytes removed.  We measured a standard plus femoral component.  Synovectomy was performed.  Because of patient's size, we elected to use an MBT revision tibia with the knee flexed at 9 degrees and using the external guide, a transverse proximal tibial cut was made with a 2-degree angle of declination.  I have made a secondary cut as I thought the first cut was not adequate in bone resection.  Subsequent cuts were then made on the femur using the standard plus jig and using a 4-degree distal femoral valgus cut.  MCL and LCL remained intact.  Lamina spreaders were then inserted into the medial and lateral compartments to remove medial and lateral menisci, ACL and PCL.  I also removed osteophytes in the posterior femoral condyle by using a 3/4-inch curved osteotome.  There were several osteocartilaginous loose bodies that removed from the medial compartment  and intercondylar notch.  One measured about a centimeter in diameter, others were less than 5 mm in diameter.  The final cut was then made on the femur for tapering purposes and to obtain the center holes.  A 10-mm flexion and extension gaps were perfectly symmetrical throughout the procedure.  At every bony cut, I checked our bony alignment.  Retractors were then placed around the tibia.  A #4 tibial tray was then applied.  The center hole was made followed by the keeled cut.  Cuts were made for the MBT revision tray with a longer  tibial stem.  With a trial component in place, the 10 mm bridging bearing was applied to the tibia followed by the standard plus trial femoral component.  The entire construct was then reduced and through a full range of motion.  There was no opening with varus or valgus stress, or with full extension. There was no malrotation of the tibial tray.  The patella was then prepared by removing 10 mm of bone leaving 13 mm of patella thickness.  A 3-peg jig was applied for 3 holes made.  The trial patella was inserted and through a full range of motion, it was perfectly stable.  The trial components were then removed.  The joint was copiously irrigated with saline solution.  The final components were then secured with the DePuy SmartSet GHV Gentamicin bone cement.  We initially inserted the MBT #4 revision tibial tray.  It was nicely impacted and flush on the tibia.  Extraneous methacrylate was removed from its periphery.  The 10 mm polyethylene bridging bearing was then inserted followed by the cemented femoral component.  With the knee in full extension, we removed any further extraneous methacrylate from the periphery of the components.  Patella was then applied with the same cement using the patellar clamp. At approximately 15 minutes, the methacrylate was hardened.  The joint was then inspected, there was no further extraneous methacrylate.  We injected the deep capsule with 0.25% Marcaine with epinephrine. Tourniquet was deflated at 1 hour and 36 minutes.  We had immediate capillary refill to the joint surface.  Gross bleeders were Bovie coagulated.  Hemovac was inserted.  The deep capsule was closed with interrupted #1 Ethibond superficial capsule with 0 and 2-0 Vicryl and subcu with 3-0 Monocryl.  Skin closed with skin clips.  Sterile bulky dressing was applied followed by the patient's support stocking.  The patient tolerated the procedure well without  complications.     Claude Manges. Cleophas Dunker, M.D.     PWW/MEDQ  D:  06/23/2011  T:  06/23/2011  Job:  [id number]

## 2011-06-24 LAB — CBC
HCT: 28.6 % — ABNORMAL LOW (ref 36.0–46.0)
Hemoglobin: 9.1 g/dL — ABNORMAL LOW (ref 12.0–15.0)
MCH: 28.4 pg (ref 26.0–34.0)
MCHC: 31.8 g/dL (ref 30.0–36.0)
RDW: 15.4 % (ref 11.5–15.5)

## 2011-06-24 LAB — BASIC METABOLIC PANEL
BUN: 7 mg/dL (ref 6–23)
Calcium: 9.2 mg/dL (ref 8.4–10.5)
GFR calc Af Amer: >90 mL/min (ref >90)
GFR calc non Af Amer: >90 mL/min (ref >90)
Glucose, Bld: 213 mg/dL — ABNORMAL HIGH (ref 70–99)
Sodium: 140 mEq/L (ref 135–145)

## 2011-06-24 MED ORDER — OXYCODONE HCL 20 MG PO TB12
20.0000 mg | ORAL_TABLET | Freq: Two times a day (BID) | ORAL | Status: DC
  Administered 2011-06-24 – 2011-06-26 (×5): 20 mg via ORAL
  Filled 2011-06-24 (×5): qty 1

## 2011-06-24 MED ORDER — ACETAMINOPHEN 10 MG/ML IV SOLN
1000.0000 mg | Freq: Four times a day (QID) | INTRAVENOUS | Status: AC
  Administered 2011-06-24 – 2011-06-25 (×3): 1000 mg via INTRAVENOUS
  Filled 2011-06-24 (×7): qty 100

## 2011-06-24 NOTE — Progress Notes (Signed)
Patient ID: [name], female   DOB: 07-12-1965, 46 y.o.   MRN: [id number] PATIENT ID: [name]        MRN:  [id number]          DOB/AGE: Aug 19, 1965 / 46 y.o.  [name], MD   [name], PA-C [address] [place], [place], [place]  [address]                             [phone]   PROGRESS NOTE  Subjective:  negative for Chest Pain  negative for Shortness of Breath  negative for Nausea/Vomiting   negative for Calf Pain  negative for Bowel Movement   Tolerating Diet: yes         Patient reports pain as 3 on 0-10 scale.    Objective: Vital signs in last 24 hours:   Patient Vitals for the past 24 hrs:  BP Temp Temp src Pulse Resp SpO2 Height Weight  06/24/11 0607 115/73 mmHg 97.8 F (36.6 C) - 103  20  100 % - -  06/24/11 0357 - - - - 18  98 % - -  06/24/11 0220 103/62 mmHg 97.1 F (36.2 C) - 102  20  99 % - -  06/24/11 0035 - - - - 22  98 % - -  06/23/11 2200 - - - - 18  99 % - -  06/23/11 2142 117/52 mmHg 97.8 F (36.6 C) - 105  20  99 % - -  06/23/11 2000 - - - - 20  96 % - -  06/23/11 1753 - - - - 22  95 % - -  06/23/11 1431 109/54 mmHg 97.7 F (36.5 C) Oral 106  20  98 % - -  06/23/11 1334 - - - - 18  99 % - -  06/23/11 1329 118/58 mmHg 98.3 F (36.8 C) Oral 104  18  99 % - -  06/23/11 1216 116/75 mmHg 98.6 F (37 C) Oral 106  20  98 % 5\' 4"  (1.626 m) 136.578 kg (301 lb 1.6 oz)  06/23/11 1130 129/82 mmHg 99.2 F (37.3 C) Oral 106  21  98 % - -  06/23/11 1117 - - - 109  20  99 % - -  06/23/11 1116 - - - 111  19  98 % - -  06/23/11 1115 - - - 108  25  98 % - -  06/23/11 1114 - - - 110  28  95 % - -  06/23/11 1113 - - - 107  23  96 % - -  06/23/11 1112 - - - 106  26  96 % - -  06/23/11 1111 - - - 107  21  98 % - -  06/23/11 1110 - - - 106  29  94 % - -  06/23/11 1109 - - - 108  26  98 % - -  06/23/11 1108 - - - 108  20  99 % - -  06/23/11 1107 - - - 108  21  99 % - -  06/23/11 1030 - 98 F (36.7 C) - - - - - -    @flow {1959:LAST@     Intake/Output from previous day:   02/26 0701 - 02/27 0700 In: 2200 [I.V.:2200] Out: 4265 [Urine:3550; Drains:515]   Intake/Output this shift:       Intake/Output      02/26 0701 -  02/27 0700 02/27 0701 - 02/28 0700   I.V. (mL/kg) 2200 (16.1)    Total Intake(mL/kg) 2200 (16.1)    Urine (mL/kg/hr) 3550 (1.1)    Drains 515    Blood 200    Total Output 4265    Net -2065            LABORATORY DATA:  Basename 06/24/11 0602 06/18/11 1047  WBC 10.5 10.1  HGB 9.1* 12.6  HCT 28.6* 37.6  PLT 221 276    Basename 06/24/11 0602 06/18/11 1047  NA 140 139  K 4.3 4.2  CL 102 101  CO2 28 24  BUN 7 11  CREATININE 0.58 0.55  GLUCOSE 213* 130*  CALCIUM 9.2 10.9*   Lab Results  Component Value Date   INR 0.97 06/18/2011   INR 1.2 04/29/2007   INR 1.1 04/28/2007    Examination:  General appearance: alert, cooperative, no distress and morbidly obese  Wound Exam: clean, dry, intact   Drainage:  None: wound tissue dry  Motor Exam: EHL, FHL, Anterior Tibial and Posterior Tibial Intact  Sensory Exam: Superficial Peroneal, Deep Peroneal and Tibial normal  Vascular Exam: pulses intact   Assessment:    1 Day Post-Op  Procedure(s) (LRB): TOTAL KNEE ARTHROPLASTY (Right)  ADDITIONAL DIAGNOSIS:  Principal Problem:  *Osteoarthritis of knee Active Problems:  Obesity, morbid (more than 100 lbs over ideal weight or BMI > 40)  Acute Blood Loss Anemia and Hyperglycemia   Plan: Physical Therapy as ordered Partial Weight Bearing @ 50% (PWB)  DVT Prophylaxis:  Xarelto and Foot Pumps  DISCHARGE PLAN: Home  DISCHARGE NEEDS: HHPT, CPM, Walker and 3-in-1 comode seat         Liliahna Cudd W 06/24/2011, 8:03 AM

## 2011-06-24 NOTE — Progress Notes (Signed)
Physical Therapy Treatment Patient Details Name: [name] MRN: [id number] DOB: Dec 21, 1965 Today's Date: 06/24/2011  PT Assessment/Plan  PT - Assessment/Plan Comments on Treatment Session: Pt able to practice stairs and increase ambulation. Should be able to DC home tomorrow per goals set by PT on eval. Pt progressing very well.  PT Plan: Discharge plan remains appropriate PT Frequency: 7X/week Recommendations for Other Services: OT consult Follow Up Recommendations: Home health PT;Supervision/Assistance - 24 hour Equipment Recommended: None recommended by PT PT Goals  Acute Rehab PT Goals PT Goal: Sit to Supine/Side - Progress: Progressing toward goal PT Goal: Sit to Stand - Progress: Progressing toward goal PT Goal: Stand to Sit - Progress: Progressing toward goal PT Goal: Ambulate - Progress: Progressing toward goal PT Goal: Up/Down Stairs - Progress: Progressing toward goal  PT Treatment Precautions/Restrictions  Precautions Precautions: Knee Restrictions Weight Bearing Restrictions: Yes RLE Weight Bearing: Partial weight bearing RLE Partial Weight Bearing Percentage or Pounds: 50% Mobility (including Balance) Bed Mobility Bed Mobility: No Sit to Supine: 6: Modified independent (Device/Increase time) Transfers Sit to Stand: With upper extremity assist;With armrests;5: Supervision;From chair/3-in-1 Sit to Stand Details (indicate cue type and reason): Min verbal cues for hand placement and technique Stand to Sit: 5: Supervision;With upper extremity assist;With armrests;To chair/3-in-1 Stand to Sit Details: Cues for safe hand placement Ambulation/Gait Ambulation/Gait Assistance: Other (comment) (MinGuard A) Ambulation/Gait Assistance Details (indicate cue type and reason): Cues for [facility] Ambulation Distance (Feet): 250 Feet Assistive device: Rolling walker Gait Pattern: Decreased stride length;Step-to pattern Stairs: Yes Stairs Assistance: 4: Min assist Stairs  Assistance Details (indicate cue type and reason): Cues for sequency and technique Stair Management Technique: Backwards;With walker Number of Stairs: 1  (practiced twice)    Exercise    End of Session PT - End of Session Equipment Utilized During Treatment: Gait belt Activity Tolerance: Patient tolerated treatment well Patient left: in bed;with call bell in reach Nurse Communication: Mobility status for transfers;Mobility status for ambulation General Behavior During Session: [facility] for tasks performed Cognition: [facility] for tasks performed  Aldean Pipe, Adline Potter 06/24/2011, 2:38 PM  06/24/2011 Fredrich Birks PTA [phone] pager [phone] office

## 2011-06-24 NOTE — Progress Notes (Signed)
Clinical Social Work-CSW received referral-per RNCM and PN pt will d/c home with home health-No CSW needs at this time-Constant Mand[place]-MSW, [phone]

## 2011-06-24 NOTE — Evaluation (Signed)
Occupational Therapy Evaluation Patient Details Name: [name] MRN: [id number] DOB: 10-Dec-1965 Today's Date: 06/24/2011  Problem List:  Patient Active Problem List  Diagnoses  . Osteoarthritis of knee  . Obesity, morbid (more than 100 lbs over ideal weight or BMI > 40)    Past Medical History:  Past Medical History  Diagnosis Date  . Pneumonia     hx 20+ years ago  . Headache     hormonal headache  . Arthritis   . Neuropathy     bilateral feet  . Ulcer of foot     toe ulcer greater than a year ago   Past Surgical History:  Past Surgical History  Procedure Date  . Knee arthroscopy     left knee  . Replacement total knee     left    OT Assessment/Plan/Recommendation OT Assessment Clinical Impression Statement: Pt. presents s/p Rt. TKA and with increased pain. All education complete and pt. moving well. Recommend D/C home with family assist PRN. Will sign off acutely.  OT Recommendation/Assessment: Patient will need skilled OT in the acute care venue Barriers to Discharge: None OT Recommendation Follow Up Recommendations: No OT follow up Equipment Recommended: None recommended by OT OT Goals  none  OT Evaluation Precautions/Restrictions  Precautions Precautions: Knee Restrictions Weight Bearing Restrictions: Yes RLE Weight Bearing: Partial weight bearing RLE Partial Weight Bearing Percentage or Pounds: 50% Prior Functioning Home Living Lives With: Spouse Receives Help From: Family Type of Home: House Home Layout: One level Home Access: Stairs to enter Entrance Stairs-Rails: None Secretary/administrator of Steps: 1 Bathroom Shower/Tub: Health visitor: Standard Bathroom Accessibility: Yes How Accessible: Accessible via walker Home Adaptive Equipment: Walker - rolling (can borrow 3in1 if necessary) Prior Function Level of Independence: Independent with homemaking with ambulation Able to Take Stairs?: Yes Driving:  Yes ADL ADL Grooming: Performed;Wash/dry hands;Set up Where Assessed - Grooming: Standing at sink Upper Body Bathing: Simulated;Chest;Right arm;Left arm;Abdomen;Set up Where Assessed - Upper Body Bathing: Sitting, bed Lower Body Bathing: Simulated;Moderate assistance Lower Body Bathing Details (indicate cue type and reason): Educated pt. on use of long handled sponge to increase level of independence Where Assessed - Lower Body Bathing: Sit to stand from chair Upper Body Dressing: Performed;Set up Upper Body Dressing Details (indicate cue type and reason): With donning gown Where Assessed - Upper Body Dressing: Sitting, bed Lower Body Dressing: Simulated;Moderate assistance Lower Body Dressing Details (indicate cue type and reason): Pt. verbalized understanding of use of reacher and sock aid Where Assessed - Lower Body Dressing: Sit to stand from chair Toilet Transfer: Set up;Supervision/safety;Performed Toilet Transfer Details (indicate cue type and reason): Min verbal cues for hand placement on grab bar Toilet Transfer Method: Ambulating Toilet Transfer Equipment: Raised toilet seat with arms (or 3-in-1 over toilet) Toileting - Clothing Manipulation: Simulated;Set up Where Assessed - Toileting Clothing Manipulation: Sit to stand from 3-in-1 or toilet Toileting - Hygiene: Performed;Modified independent Where Assessed - Toileting Hygiene: Sit on 3-in-1 or toilet Tub/Shower Transfer: Performed;Minimal assistance Tub/Shower Transfer Details (indicate cue type and reason): Min verbal cues for safety and technique for posterior entrance into shower with RW use Tub/Shower Transfer Method: Ambulating Tub/Shower Transfer Equipment: Shower seat with back;Grab bars Equipment Used: Rolling walker Ambulation Related to ADLs: Pt. supervision ~15' with RW ADL Comments: Pt. educated on techniques for completing LB ADLs and shower transfer technique.     Mobility  Bed Mobility Bed Mobility:  No Transfers Transfers: Yes Sit to Stand: 5: Supervision;With upper  extremity assist;With armrests;From chair/3-in-1 Sit to Stand Details (indicate cue type and reason): Min verbal cues for hand placement and technique    End of Session OT - End of Session Equipment Utilized During Treatment: Gait belt Activity Tolerance: Patient tolerated treatment well Patient left: in chair;with call bell in reach Nurse Communication: Mobility status for transfers General Behavior During Session: [facility] for tasks performed Cognition: [facility] for tasks performed   Geraldina Parrott, OTR/L Pager [phone] 06/24/2011, 1:25 PM

## 2011-06-24 NOTE — Progress Notes (Signed)
Physical Therapy Evaluation Patient Details Name: [name] MRN: [id number] DOB: August 11, 1965 Today's Date: 06/24/2011  Problem List:  Patient Active Problem List  Diagnoses  . Osteoarthritis of knee  . Obesity, morbid (more than 100 lbs over ideal weight or BMI > 40)    Past Medical History:  Past Medical History  Diagnosis Date  . Pneumonia     hx 20+ years ago  . Headache     hormonal headache  . Arthritis   . Neuropathy     bilateral feet  . Ulcer of foot     toe ulcer greater than a year ago   Past Surgical History:  Past Surgical History  Procedure Date  . Knee arthroscopy     left knee  . Replacement total knee     left    PT Assessment/Plan/Recommendation PT Assessment Clinical Impression Statement: 46 yo female s/p RTKA presents with decr functional mobility; will benefit from PT to maximize independence and safety with mobility/amb/ therex to enable safe dc home PT Recommendation/Assessment: Patient will need skilled PT in the acute care venue PT Problem List: Decreased strength;Decreased range of motion;Decreased mobility;Decreased knowledge of use of DME;Pain;Obesity PT Therapy Diagnosis : Difficulty walking;Acute pain PT Plan PT Frequency: 7X/week PT Treatment/Interventions: DME instruction;Gait training;Stair training;Functional mobility training;Therapeutic activities;Therapeutic exercise;Patient/family education PT Recommendation Recommendations for Other Services: OT consult Follow Up Recommendations: Home health PT;Supervision/Assistance - 24 hour Equipment Recommended: None recommended by PT PT Goals  Acute Rehab PT Goals PT Goal Formulation: With patient Time For Goal Achievement: 7 days Pt will go Supine/Side to Sit: with modified independence PT Goal: Supine/Side to Sit - Progress: Goal set today Pt will go Sit to Supine/Side: with modified independence PT Goal: Sit to Supine/Side - Progress: Goal set today Pt will go Sit to Stand: with  modified independence PT Goal: Sit to Stand - Progress: Goal set today Pt will go Stand to Sit: with modified independence PT Goal: Stand to Sit - Progress: Goal set today Pt will Ambulate: >150 feet;with supervision;with rolling walker PT Goal: Ambulate - Progress: Goal set today Pt will Go Up / Down Stairs: 1-2 stairs;with supervision;with rolling walker PT Goal: Up/Down Stairs - Progress: Goal set today Pt will Perform Home Exercise Program: with supervision, verbal cues required/provided PT Goal: Perform Home Exercise Program - Progress: Goal set today  PT Evaluation Precautions/Restrictions  Precautions Precautions: Knee Restrictions Weight Bearing Restrictions: Yes RLE Weight Bearing: Partial weight bearing RLE Partial Weight Bearing Percentage or Pounds: 50% Prior Functioning  Home Living Lives With: Spouse Receives Help From: Family Type of Home: House Home Layout: One level Home Access: Stairs to enter Entrance Stairs-Rails: None Secretary/administrator of Steps: 1 Bathroom Shower/Tub: Health visitor: Standard Bathroom Accessibility: Yes How Accessible: Accessible via walker Home Adaptive Equipment: Walker - rolling (can borrow 3in1 if necessary) Prior Function Level of Independence: Independent with homemaking with ambulation Able to Take Stairs?: Yes Driving: Yes Leisure:  (fox hunting, 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 Arousal/Alertness: Awake/alert Overall Cognitive Status: Appears within functional limits for tasks assessed Orientation Level: Oriented X4 Sensation/Coordination Sensation Light Touch: Impaired by gross assessment (h/o peripheral neuropathy) Coordination Gross Motor Movements are Fluid and Coordinated: Yes Fine Motor Movements are Fluid and Coordinated: Yes Extremity Assessment RUE Assessment RUE Assessment: Within Functional Limits LUE Assessment LUE Assessment: Within Functional Limits RLE Assessment RLE Assessment:  Exceptions to [facility] RLE PROM (degrees) Right Knee Extension 0-130:  (2 deg shy of full ext ) Right Knee Flexion 0-140:  70  (by visual estimate) RLE Strength RLE Overall Strength Comments: grossly decr strength, limited by pain LLE Assessment LLE Assessment: Within Functional Limits Mobility (including Balance) Bed Mobility Bed Mobility: Yes Supine to Sit: 4: Min assist Supine to Sit Details (indicate cue type and reason): Cues for technique Transfers Transfers: Yes Sit to Stand: 4: Min assist;From bed;From toilet (3 reps) Sit to Stand Details (indicate cue type and reason): cues for comfort, safety, hand placement Stand to Sit: To toilet;To chair/3-in-1;4: Min assist Stand to Sit Details: cues for optimal positioning, control of descent, 50% PWB Ambulation/Gait Ambulation/Gait: Yes Ambulation/Gait Assistance: 4: Min assist (progressing to minguard assist) Ambulation/Gait Assistance Details (indicate cue type and reason): cues for sequence, PWB, and to activate quad in stance tor stability Ambulation Distance (Feet): 100 Feet Assistive device: Rolling walker Gait Pattern: Step-to pattern    Exercise  Total Joint Exercises Ankle Circles/Pumps: AROM;Both;10 reps;Supine Quad Sets: AROM;Right;Supine;5 reps Heel Slides: AAROM;Right;5 reps;Supine End of Session PT - End of Session Equipment Utilized During Treatment: Gait belt Activity Tolerance: Patient tolerated treatment well Patient left: in chair;with call bell in reach Nurse Communication: Mobility status for ambulation General Behavior During Session: [facility] for tasks performed Cognition: [facility] for tasks performed  [name], [name] [phone]  06/24/2011, 12:13 PM

## 2011-06-25 ENCOUNTER — Encounter (HOSPITAL_COMMUNITY): Payer: Self-pay | Admitting: Orthopaedic Surgery

## 2011-06-25 ENCOUNTER — Other Ambulatory Visit: Payer: Self-pay

## 2011-06-25 ENCOUNTER — Inpatient Hospital Stay (HOSPITAL_COMMUNITY): Payer: BC Managed Care – PPO

## 2011-06-25 DIAGNOSIS — D62 Acute posthemorrhagic anemia: Secondary | ICD-10-CM | POA: Diagnosis not present

## 2011-06-25 DIAGNOSIS — R739 Hyperglycemia, unspecified: Secondary | ICD-10-CM | POA: Diagnosis present

## 2011-06-25 LAB — URINALYSIS, ROUTINE W REFLEX MICROSCOPIC
Leukocytes, UA: NEGATIVE
Nitrite: NEGATIVE
Protein, ur: NEGATIVE mg/dL
Urobilinogen, UA: 0.2 mg/dL (ref 0.0–1.0)

## 2011-06-25 LAB — COMPREHENSIVE METABOLIC PANEL
AST: 23 U/L (ref 0–37)
CO2: 29 mEq/L (ref 19–32)
Calcium: 9.3 mg/dL (ref 8.4–10.5)
Creatinine, Ser: 0.63 mg/dL (ref 0.50–1.10)
GFR calc Af Amer: >90 mL/min (ref >90)
GFR calc non Af Amer: >90 mL/min (ref >90)
Glucose, Bld: 185 mg/dL — ABNORMAL HIGH (ref 70–99)

## 2011-06-25 LAB — CBC
MCH: 28.8 pg (ref 26.0–34.0)
MCHC: 31.6 g/dL (ref 30.0–36.0)
Platelets: 232 10*3/uL (ref 150–400)

## 2011-06-25 LAB — BASIC METABOLIC PANEL
Calcium: 8.9 mg/dL (ref 8.4–10.5)
GFR calc Af Amer: >90 mL/min (ref >90)
GFR calc non Af Amer: >90 mL/min (ref >90)
Sodium: 137 mEq/L (ref 135–145)

## 2011-06-25 LAB — URINE MICROSCOPIC-ADD ON

## 2011-06-25 NOTE — Progress Notes (Signed)
Patient ID: [name], female   DOB: 10/11/1965, 46 y.o.   MRN: [id number] PATIENT ID: [name]        MRN:  [id number]          DOB/AGE: 31-Jul-1965 / 46 y.o.  [name] Campbell, MD   [name], PA-C [address] [place], [place], [place]  [address]                             [phone]   PROGRESS NOTE  Subjective:  negative for Chest Pain  negative for Shortness of Breath  negative for Nausea/Vomiting   negative for Calf Pain  negative for Bowel Movement   Tolerating Diet: yes         Patient reports pain as 2 on 0-10 scale.    Objective: Vital signs in last 24 hours:   Patient Vitals for the past 24 hrs:  BP Temp Pulse Resp SpO2  06/25/11 0502 143/74 mmHg 100.2 F (37.9 C) 113  20  95 %  06/24/11 2054 123/60 mmHg 99.8 F (37.7 C) 104  18  98 %  06/24/11 1500 142/69 mmHg 98.2 F (36.8 C) 108  17  99 %    @flow {1959:LAST@   Intake/Output from previous day:   02/27 0701 - 02/28 0700 In: -  Out: 200 [Drains:200]   Intake/Output this shift:       Intake/Output      02/27 0701 - 02/28 0700 02/28 0701 - 03/01 0700   I.V. (mL/kg)     Total Intake(mL/kg)     Urine (mL/kg/hr)     Drains 200    Blood     Total Output 200    Net -200         Urine Occurrence 2 x       LABORATORY DATA:  Basename 06/25/11 0616 06/24/11 0602  WBC 10.7* 10.5  HGB 9.2* 9.1*  HCT 29.1* 28.6*  PLT 232 221    Basename 06/25/11 0616 06/24/11 0602  NA 137 140  K 4.1 4.3  CL 99 102  CO2 28 28  BUN 6 7  CREATININE 0.57 0.58  GLUCOSE 164* 213*  CALCIUM 8.9 9.2   Lab Results  Component Value Date   INR 0.97 06/18/2011   INR 1.2 04/29/2007   INR 1.1 04/28/2007    Examination:  General appearance: alert, cooperative, mild distress and morbidly obese Resp: clear to auscultation bilaterally Cardio: regular rate and rhythm GI: normal findings: bowel sounds normal  Wound Exam: clean, dry, intact   Drainage:  None: wound tissue dry  Motor Exam: EHL, FHL, Anterior  Tibial and Posterior Tibial Intact  Sensory Exam: Superficial Peroneal, Deep Peroneal and Tibial normal  Vascular Exam: good pulses   Assessment:    2 Days Post-Op  Procedure(s) (LRB): TOTAL KNEE ARTHROPLASTY (Right)  ADDITIONAL DIAGNOSIS:  Principal Problem:  *Osteoarthritis of knee Active Problems:  Obesity, morbid (more than 100 lbs over ideal weight or BMI > 40)  Acute Blood Loss Anemia and Hyperglycemia   Plan: Physical Therapy as ordered Partial Weight Bearing @ 50% (PWB)  DVT Prophylaxis:  Xarelto and Foot Pumps  DISCHARGE PLAN: Home  DISCHARGE NEEDS: HHPT, CPM, Walker and 3-in-1 comode seat  IV was to be saline locked but was still running at [facility] Hemovac pulled. Dressing changed     [name] 06/25/2011, 11:55 AM

## 2011-06-25 NOTE — Progress Notes (Signed)
Notified Josh Chadwell, PA of EKG results of sinus tachycardia, otherwise normal EKG.  Order received:  Watch patient during the night.

## 2011-06-25 NOTE — Discharge Summary (Signed)
PATIENT ID:      [name]  MRN:     [id number] DOB/AGE:    1965-11-24 / 46 y.o.     DISCHARGE SUMMARY  ADMISSION DATE:    06/23/2011 DISCHARGE DATE:   06/26/2011   ADMISSION DIAGNOSIS: OSTEOARTHRITIS RIGHT KNEE   (715.16))  DISCHARGE DIAGNOSIS:  OSTEOARTHRITIS RIGHT KNEE (715.16)    ADDITIONAL DIAGNOSIS: Principal Problem:  *Osteoarthritis of knee Active Problems:  Obesity, morbid (more than 100 lbs over ideal weight or BMI > 40)  Postoperative anemia due to acute blood loss  Hyperglycemia  Past Medical History  Diagnosis Date  . Pneumonia     hx 20+ years ago  . Headache     hormonal headache  . Arthritis   . Neuropathy     bilateral feet  . Ulcer of foot     toe ulcer greater than a year ago    PROCEDURE: Procedure(s): TOTAL KNEE ARTHROPLASTY on 06/23/2011  CONSULTS:   none  HISTORY: [name] is a very pleasant 46 year old white female who is seen today for evaluation of her right knee. She states she's had about a 4 year history of right knee pain which unfortunately now is worsening to the point which it is now affecting her activities of daily living as well as her ability to walk. She's having to use an assistive device, that of a cane, for ambulation. She unfortunately has worsened to the point now where she states she's having severe right knee pain which is constant and that of a sharp, stabbing, throbbing, aching and burning quality. She's unable to sleep secondary to pain. She is using Vicodin 10/500 mg. She has tried wearing a brace but this has not been very beneficial.    HOSPITAL COURSE:  [name] is a 46 y.o. admitted on 06/23/2011 and found to have a diagnosis of OSTEOARTHRITIS RIGHT KNEE (715.16).  After appropriate laboratory studies were obtained  they were taken to the operating room on 06/23/2011 and underwent Procedure(s): TOTAL KNEE ARTHROPLASTY.   They were given perioperative antibiotics:  Anti-infectives     Start     Dose/Rate Route  Frequency Ordered Stop   06/22/11 1515   ceFAZolin (ANCEF) IVPB 2 g/50 mL premix        2 g 100 mL/hr over 30 Minutes Intravenous 60 min pre-op 06/22/11 1509 06/23/11 0730        . Blood products given:none  After appropriate laboratory studies were obtained, the patient was taken to the operating room where they underwent a Right Total Hip Replacement.  Tolerated the procedure well.  Placed with a foley intraoperatively.  Given Ofirmev at induction and for 48 hours.  PCA for analgesia.  POD #1, allowed out of bed to a chair.  PT for ambulation and exercise program.  Foley D/C'd in morning.  IV saline locked.  O2 discontionued. PCA D/C'd.  POD #2, continued PT and ambulation. Hemovac discontinued.  Dressing changed. The remainder of the hospital course was dedicated to ambulation and strengthening. She developed elevation in her temperature and CXR and labs were ordered including U/A.  These were essentially normal.  Called about tachycardia and EKG was ordered and read as sinus tach.  POD#3, she felt better.  Temperature appeared to be improving and she wanted to be discharged.   The patient was discharged on 3 Days Post-Op in  Stable condition.   DIAGNOSTIC STUDIES: Recent vital signs:  Patient Vitals for the past 24 hrs:  BP Temp  Pulse Resp SpO2  06/26/11 0538 124/69 mmHg 100.3 F (37.9 C) 119  18  98 %  2011-07-21 2100 160/70 mmHg 97.6 F (36.4 C) 126  18  96 %  07-21-2011 1420 - 100.8 F (38.2 C) - - -  07-21-11 1400 - - - 17  -  2011-07-21 1315 145/72 mmHg 101.2 F (38.4 C) 121  17  -       Recent laboratory studies:  Basename 06/26/11 0645 Jul 21, 2011 0616 06/24/11 0602  WBC 11.0* 10.7* 10.5  HGB 8.6* 9.2* 9.1*  HCT 26.9* 29.1* 28.6*  PLT 260 232 221    Basename 06/26/11 0645 07/21/2011 2120 07-21-2011 0616 06/24/11 0602  NA 135 136 137 140  K 3.7 4.0 4.1 4.3  CL 97 98 99 102  CO2 29 29 28 28   BUN 6 6 6 7   CREATININE 0.52 0.63 0.57 0.58  GLUCOSE 214* 185* 164* 213*    CALCIUM 8.9 9.3 8.9 9.2   Lab Results  Component Value Date   INR 0.97 06/18/2011   INR 1.2 04/29/2007   INR 1.1 04/28/2007     Recent Radiographic Studies :  Dg Chest 2 View  06/18/2011  *RADIOLOGY REPORT*  Clinical Data: Preop total knee replacement.  CHEST - 2 VIEW  Comparison: None  Findings: Heart and mediastinal contours are within normal limits. No focal opacities or effusions.  No acute bony abnormality.  IMPRESSION: No active cardiopulmonary disease.  Original Report Authenticated By: Cyndie Chime, M.D.   Mr Toes Left Wo/w Cm  06/12/2011  *RADIOLOGY REPORT*  Clinical Data: Slowly healing wound of the left great toe. Question osteomyelitis or cellulitis.  Wound is on the plantar surface of the toe.  MRI OF THE LEFT TOES WITHOUT AND WITH CONTRAST  Technique:  Multiplanar, multisequence MR imaging was performed both before and after administration of intravenous contrast.  Contrast: 20mL MULTIHANCE GADOBENATE DIMEGLUMINE 529 MG/ML IV SOLN  Comparison: None.  Findings: No bone marrow signal abnormality to suggest osteomyelitis is seen.  The patient does have some degenerative change of the first MTP joint and midfoot most notable at the third tarsometatarsal joint.  There is no fracture.  Subcutaneous tissues of the foot show some increased T2 signal.  No abscess is identified.  Intrinsic musculature of the foot appears diffusely atrophied.  No tendon tear is identified.  IMPRESSION:  1.  Wound on the plantar surface of the great toe without osteomyelitis or abscess. 2.  First MTP and midfoot degenerative change.  Original Report Authenticated By: Bernadene Bell. D'ALESSIO, M.D.    DISCHARGE INSTRUCTIONS: Discharge Orders    Future Orders Please Complete By Expires   Diet general      Call MD / Call 911      Comments:   If you experience chest pain or shortness of breath, CALL 911 and be transported to the hospital emergency room.  If you develope a fever above 101 F, pus (white drainage) or  increased drainage or redness at the wound, or calf pain, call your surgeon's office.   Constipation Prevention      Comments:   Drink plenty of fluids.  Prune juice may be helpful.  You may use a stool softener, such as Colace (over the counter) 100 mg twice a day.  Use MiraLax (over the counter) for constipation as needed.   Increase activity slowly as tolerated      Weight Bearing as taught in Physical Therapy      Comments:  Use a walker or crutches as instructed in Physical Therapy (PT) 50% weight bearing   Patient may shower      Comments:   You may shower without a dressing once there is no drainage.  Do not wash over the wound.  If drainage remains, cover wound with plastic wrap and then shower.   Driving restrictions      Comments:   No driving for 6 weeks   Lifting restrictions      Comments:   No lifting for 6 weeks   CPM      Comments:   Continuous passive motion machine (CPM):      Use the CPM from 0 to 70 for 6-8 hours per day.      You may increase by 5-10  per day.  You may break it up into 2 or 3 sessions per day.      Use CPM for 3 weeks or until you are told to stop.   Change dressing      Comments:   Change dressing on Saturday, then change the dressing daily with sterile 4 x 4 inch gauze dressing and apply TED hose.  You may clean the incision with alcohol prior to redressing.   Do not put a pillow under the knee. Place it under the heel.         DISCHARGE MEDICATIONS:   Medication List  As of 06/26/2011  8:28 AM   STOP taking these medications         diclofenac 75 MG EC tablet      GLUCOSAMINE-CHONDROITIN-MSM PO      mulitivitamin with minerals Tabs         TAKE these medications         DULoxetine 60 MG capsule   Commonly known as: CYMBALTA   Take 60 mg by mouth daily.      methocarbamol 500 MG tablet   Commonly known as: ROBAXIN   Take 1 tablet (500 mg total) by mouth every 6 (six) hours as needed.      oxyCODONE 5 MG immediate release  tablet   Commonly known as: Oxy IR/ROXICODONE   Take 1-2 tablets (5-10 mg total) by mouth every 4 (four) hours as needed.      oxyCODONE 20 MG 12 hr tablet   Commonly known as: OXYCONTIN   Take 1 tablet (20 mg total) by mouth every 12 (twelve) hours.      rivaroxaban 10 MG Tabs tablet   Commonly known as: XARELTO   Take 1 tablet (10 mg total) by mouth daily.            FOLLOW UP VISIT:   Follow-up Information    Follow up with [facility] on 07/08/2011.   Contact information:   [address] [place] [address] [phone]          DISPOSITION:  Home    CONDITION:  Stable   Lorelie Biermann 06/26/2011, 8:28 AM

## 2011-06-25 NOTE — Progress Notes (Signed)
Contacted Josh Chester, PA concerning heart rate 126 now, highest was 121 earlier today.  Stated he would call back.

## 2011-06-25 NOTE — Progress Notes (Signed)
Josh Chadwell, PA called back to give new orders of STAT EKG and CMET.

## 2011-06-25 NOTE — Progress Notes (Signed)
Physical Therapy Treatment Patient Details Name: [name] MRN: [id number] DOB: 02/27/1966 Today's Date: 06/25/2011  PT Assessment/Plan  PT - Assessment/Plan Comments on Treatment Session: Pt continues to move well.  mobilizes at an adequate enough level to d/c home with spouse & son.   PT Plan: Discharge plan remains appropriate PT Frequency: 7X/week Follow Up Recommendations: Home health PT;Supervision/Assistance - 24 hour Equipment Recommended: None recommended by PT PT Goals  Acute Rehab PT Goals PT Goal: Sit to Supine/Side - Progress: Met PT Goal: Sit to Stand - Progress: Progressing toward goal PT Goal: Stand to Sit - Progress: Progressing toward goal PT Goal: Ambulate - Progress: Progressing toward goal  PT Treatment Precautions/Restrictions  Precautions Precautions: Knee Restrictions Weight Bearing Restrictions: Yes RLE Weight Bearing: Partial weight bearing RLE Partial Weight Bearing Percentage or Pounds: 50% Mobility (including Balance) Bed Mobility Sit to Supine: 6: Modified independent (Device/Increase time);HOB flat Transfers Sit to Stand: With upper extremity assist;With armrests;From chair/3-in-1;5: Supervision Sit to Stand Details (indicate cue type and reason): Supervision for safety due to pt c/o significant pain & stiffness in Rt knee.   Stand to Sit: 5: Supervision;To bed;Without upper extremity assist Stand to Sit Details: cues for hand placement.  Ambulation/Gait Ambulation/Gait Assistance: 5: Supervision Ambulation/Gait Assistance Details (indicate cue type and reason): Cues for safe distance of RW from body.  Pt beiginning to progress from step-to pattern to step-through pattern Ambulation Distance (Feet): 200 Feet Assistive device: Rolling walker Gait Pattern: Step-to pattern;Step-through pattern;Decreased stride length;Decreased step length - right;Decreased step length - left Stairs: No Wheelchair Mobility Wheelchair Mobility: No    Posture/Postural Control Posture/Postural Control: No significant limitations Exercise  Total Joint Exercises Knee Flexion: AAROM;Right;10 reps;Seated End of Session PT - End of Session Equipment Utilized During Treatment: Gait belt Activity Tolerance: Patient tolerated treatment well Patient left: in bed;in CPM;with call bell in reach Nurse Communication: Mobility status for transfers;Mobility status for ambulation General Behavior During Session: [facility] for tasks performed Cognition: [facility] for tasks performed  Lara Mulch 06/25/2011, 12:02 PM [phone]

## 2011-06-25 NOTE — Progress Notes (Signed)
Utilization review completed. Davionne Dowty, RN, BSN. 06/25/11  

## 2011-06-26 LAB — CBC
HCT: 26.9 % — ABNORMAL LOW (ref 36.0–46.0)
MCV: 89.4 fL (ref 78.0–100.0)
RDW: 15.7 % — ABNORMAL HIGH (ref 11.5–15.5)
WBC: 11 10*3/uL — ABNORMAL HIGH (ref 4.0–10.5)

## 2011-06-26 LAB — BASIC METABOLIC PANEL
BUN: 6 mg/dL (ref 6–23)
Chloride: 97 mEq/L (ref 96–112)
Creatinine, Ser: 0.52 mg/dL (ref 0.50–1.10)
GFR calc Af Amer: >90 mL/min (ref >90)

## 2011-06-26 MED ORDER — RIVAROXABAN 10 MG PO TABS: 10.0000 mg | ORAL_TABLET | ORAL | Status: DC

## 2011-06-26 MED ORDER — OXYCODONE HCL 20 MG PO TB12: 20.0000 mg | ORAL_TABLET | Freq: Two times a day (BID) | ORAL | Status: AC

## 2011-06-26 MED ORDER — OXYCODONE HCL 5 MG PO TABS: 5.0000 mg | ORAL_TABLET | ORAL | Status: AC | PRN

## 2011-06-26 MED ORDER — METHOCARBAMOL 500 MG PO TABS: 500.0000 mg | ORAL_TABLET | Freq: Four times a day (QID) | ORAL | Status: AC | PRN

## 2011-06-26 NOTE — Discharge Instructions (Signed)
Home Health will be provided by [facility]- [phone]

## 2011-06-26 NOTE — Progress Notes (Signed)
Pt is s/p R TKR. Knee precautions. +CMS. Pt has generalized edema all over with the edema being worse in the RLE. Edema is 1+ pitting. Pt old mepilex dressings stained with small amount of bldy drainage this AM. Dressing changed per PA and MD. Ice prn to R knee. Pt ambulates with one assist and RW. Pt lungs CTA but noted to be diminished in the bases. Pt sats 98%. Pt repts nonprod cough. Pt performs IS per order. Heart rate regular rate and rhythm but rapid 100-120's. No s/sx or c/o resp or cardiac distress and no c/o such. Pt voids quantity sufficient without difficulty. Pt repts LBM 2/26. Abdomen soft flat nontender and nondistended. Pt repts passing gas and denies nausea or vomiting. Pt tolerates diet fair.

## 2011-06-26 NOTE — Progress Notes (Signed)
CARE MANAGEMENT NOTE 06/26/2011    Anticipated DC Date:  06/26/2011   Anticipated DC Plan:  HOME W HOME HEALTH SERVICES      DC Planning Services  CM consult      [facility] Choice  HOME HEALTH   Choice offered to / List presented to:  NA   DME arranged  NA      DME agency  NA     HH arranged  HH-2 PT      [facility] agency  [facility]   Status of service:  Completed, signed off  Discharge Disposition:  HOME W HOME HEALTH SERVICES    Comments:  06/26/11 1100 Vance Peper, RN BSN Case Manager Patient being discharged on Xarelto 10mg . contacted her pharmacy - [facility] on  [id number]-Spoke with Nadine Counts who states he has a few, but will order more. Has enough to cover the weekend. Explained this to patient.

## 2011-06-26 NOTE — Progress Notes (Signed)
Patient ID: [name], female   DOB: 1966/02/09, 46 y.o.   MRN: [id number] PATIENT ID: [name]        MRN:  [id number]          DOB/AGE: [birth year] / 46 y.o.  [name] Campbell, MD   [name] Code, PA-C [address] [place], [place], [place]  [address]                             [phone]   PROGRESS NOTE  Subjective:  negative for Chest Pain  negative for Shortness of Breath  negative for Nausea/Vomiting   negative for Calf Pain  negative for Bowel Movement   Tolerating Diet: yes         Patient reports pain as mild.    Objective: Vital signs in last 24 hours:   Patient Vitals for the past 24 hrs:  BP Temp Pulse Resp SpO2  06/26/11 0538 124/69 mmHg 100.3 F (37.9 C) 119  18  98 %  06/25/11 2100 160/70 mmHg 97.6 F (36.4 C) 126  18  96 %  06/25/11 1420 - 100.8 F (38.2 C) - - -  06/25/11 1400 - - - 17  -  06/25/11 1315 145/72 mmHg 101.2 F (38.4 C) 121  17  -    @flow {1959:LAST@   Intake/Output from previous day:   02/28 0701 - 03/01 0700 In: 510 [P.O.:510] Out: -    Intake/Output this shift:       Intake/Output      02/28 0701 - 03/01 0700 03/01 0701 - 03/02 0700   P.O. 510    Total Intake(mL/kg) 510 (3.7)    Drains     Total Output     Net +510         Urine Occurrence 8 x       LABORATORY DATA:  Basename 06/26/11 0645 06/25/11 0616 06/24/11 0602  WBC 11.0* 10.7* 10.5  HGB 8.6* 9.2* 9.1*  HCT 26.9* 29.1* 28.6*  PLT 260 232 221    Basename 06/25/11 2120 06/25/11 0616 06/24/11 0602  NA 136 137 140  K 4.0 4.1 4.3  CL 98 99 102  CO2 29 28 28   BUN 6 6 7   CREATININE 0.63 0.57 0.58  GLUCOSE 185* 164* 213*  CALCIUM 9.3 8.9 9.2   Lab Results  Component Value Date   INR 0.97 06/18/2011   INR 1.2 04/29/2007   INR 1.1 04/28/2007    Examination:  General appearance: alert, cooperative, fatigued and morbidly obese  Wound Exam: clean, dry, intact   Drainage:  None: wound tissue dry  Motor Exam: EHL, FHL, Anterior Tibial and Posterior  Tibial Intact  Sensory Exam: Superficial Peroneal, Deep Peroneal and Tibial normal  Vascular exam: pulses intact  Assessment:    3 Days Post-Op  Procedure(s) (LRB): TOTAL KNEE ARTHROPLASTY (Right)  ADDITIONAL DIAGNOSIS:  Principal Problem:  *Osteoarthritis of knee Active Problems:  Obesity, morbid (more than 100 lbs over ideal weight or BMI > 40)  Postoperative anemia due to acute blood loss  Hyperglycemia  Acute Blood Loss Anemia   Plan: Physical Therapy as ordered Partial Weight Bearing @ 50% (PWB)  DVT Prophylaxis:  Xarelto and TED hose  DISCHARGE PLAN: Home today  DISCHARGE NEEDS: HHPT, CPM, Walker and 3-in-1 comode seat         [name] W 06/26/2011, 8:02 AM

## 2011-06-26 NOTE — Progress Notes (Signed)
Physical Therapy Treatment Patient Details Name: [name] MRN: [id number] DOB: 15-Jul-1965 Today's Date: 06/26/2011  PT Assessment/Plan  PT - Assessment/Plan Comments on Treatment Session: Pt. continues to progress with PT.  Expects DC home later today.  Will need to continnue PT with HHPT. PT Plan: Discharge plan remains appropriate PT Frequency: 7X/week Follow Up Recommendations: Home health PT;Supervision/Assistance - 24 hour Equipment Recommended: None recommended by PT PT Goals  Acute Rehab PT Goals PT Goal: Sit to Stand - Progress: Progressing toward goal PT Goal: Stand to Sit - Progress: Progressing toward goal PT Goal: Ambulate - Progress: Progressing toward goal Additional Goals PT Goal: Additional Goal #1 - Progress: Progressing toward goal  PT Treatment Precautions/Restrictions  Precautions Precautions: Knee Required Braces or Orthoses: No Restrictions Weight Bearing Restrictions: Yes RLE Weight Bearing: Partial weight bearing RLE Partial Weight Bearing Percentage or Pounds: 50% Mobility (including Balance) Bed Mobility Bed Mobility: No Transfers Transfers: Yes Sit to Stand: With upper extremity assist;With armrests;From chair/3-in-1;5: Supervision Stand to Sit: 5: Supervision;To chair/3-in-1;With armrests Ambulation/Gait Ambulation/Gait: Yes Ambulation/Gait Assistance: 6: Modified independent (Device/Increase time) Ambulation/Gait Assistance Details (indicate cue type and reason):  (good sequence and technique) Ambulation Distance (Feet): 150 Feet Assistive device: Rolling walker Gait Pattern: Step-to pattern;Decreased stride length Stairs: No Stairs Assistance:  (pt. declined , stating she is comfortable with steps)    Exercise  Total Joint Exercises Ankle Circles/Pumps: AROM;Both;10 reps;Seated Quad Sets: AROM;Right;5 reps;Seated Knee Flexion: AAROM;Right;10 reps;Seated (near 0 to 90 knee AAROM) End of Session PT - End of Session Equipment  Utilized During Treatment: Gait belt Activity Tolerance: Patient tolerated treatment well Patient left: in chair;with call bell in reach;with family/visitor present General Behavior During Session: [facility] for tasks performed Cognition: [facility] for tasks performed  Ferman Hamming 06/26/2011, 11:30 AM Acute Rehabilitation Services [phone] (pager)

## 2012-03-17 ENCOUNTER — Observation Stay (HOSPITAL_COMMUNITY)
Admission: EM | Admit: 2012-03-17 | Discharge: 2012-03-17 | Disposition: A | Discharge status: Home / Self Care | Payer: BC Managed Care – PPO | Attending: Emergency Medicine | Admitting: Emergency Medicine

## 2012-03-17 ENCOUNTER — Observation Stay (HOSPITAL_COMMUNITY): Payer: BC Managed Care – PPO

## 2012-03-17 ENCOUNTER — Emergency Department (HOSPITAL_COMMUNITY): Payer: BC Managed Care – PPO

## 2012-03-17 ENCOUNTER — Encounter (HOSPITAL_COMMUNITY): Payer: Self-pay | Admitting: Emergency Medicine

## 2012-03-17 DIAGNOSIS — E669 Obesity, unspecified: Secondary | ICD-10-CM | POA: Insufficient documentation

## 2012-03-17 DIAGNOSIS — Z79899 Other long term (current) drug therapy: Secondary | ICD-10-CM | POA: Insufficient documentation

## 2012-03-17 DIAGNOSIS — R5381 Other malaise: Secondary | ICD-10-CM | POA: Insufficient documentation

## 2012-03-17 DIAGNOSIS — H409 Unspecified glaucoma: Secondary | ICD-10-CM | POA: Insufficient documentation

## 2012-03-17 DIAGNOSIS — M129 Arthropathy, unspecified: Secondary | ICD-10-CM | POA: Insufficient documentation

## 2012-03-17 DIAGNOSIS — R112 Nausea with vomiting, unspecified: Secondary | ICD-10-CM | POA: Insufficient documentation

## 2012-03-17 DIAGNOSIS — R5383 Other fatigue: Secondary | ICD-10-CM | POA: Insufficient documentation

## 2012-03-17 DIAGNOSIS — Z96659 Presence of unspecified artificial knee joint: Secondary | ICD-10-CM | POA: Insufficient documentation

## 2012-03-17 DIAGNOSIS — R42 Dizziness and giddiness: Principal | ICD-10-CM | POA: Insufficient documentation

## 2012-03-17 HISTORY — DX: Obesity, unspecified: E66.9

## 2012-03-17 LAB — CBC
Hemoglobin: 11.4 g/dL — ABNORMAL LOW (ref 12.0–15.0)
Platelets: 222 10*3/uL (ref 150–400)
RBC: 4.04 MIL/uL (ref 3.87–5.11)
WBC: 11.3 10*3/uL — ABNORMAL HIGH (ref 4.0–10.5)

## 2012-03-17 LAB — COMPREHENSIVE METABOLIC PANEL
ALT: 17 U/L (ref 0–35)
ALT: 17 U/L (ref 0–35)
AST: 16 U/L (ref 0–37)
AST: 16 U/L (ref 0–37)
Alkaline Phosphatase: 70 U/L (ref 39–117)
CO2: 25 mEq/L (ref 19–32)
Calcium: 9.5 mg/dL (ref 8.4–10.5)
GFR calc Af Amer: >90 mL/min (ref >90)
GFR calc non Af Amer: >90 mL/min (ref >90)
Glucose, Bld: 184 mg/dL — ABNORMAL HIGH (ref 70–99)
Potassium: 4.1 mEq/L (ref 3.5–5.1)
Sodium: 139 mEq/L (ref 135–145)
Sodium: 139 mEq/L (ref 135–145)
Total Protein: 7.1 g/dL (ref 6.0–8.3)

## 2012-03-17 LAB — URINALYSIS, ROUTINE W REFLEX MICROSCOPIC
Bilirubin Urine: NEGATIVE
Glucose, UA: NEGATIVE mg/dL
Ketones, ur: NEGATIVE mg/dL
Leukocytes, UA: NEGATIVE
Nitrite: NEGATIVE
Specific Gravity, Urine: 1.028 (ref 1.005–1.030)
pH: 5.5 (ref 5.0–8.0)

## 2012-03-17 LAB — LIPID PANEL
HDL: 37 mg/dL — ABNORMAL LOW (ref >39)
Total CHOL/HDL Ratio: 5.9 RATIO
Triglycerides: 433 mg/dL — ABNORMAL HIGH (ref <150)

## 2012-03-17 LAB — CBC WITH DIFFERENTIAL/PLATELET
Basophils Absolute: 0 10*3/uL (ref 0.0–0.1)
Eosinophils Absolute: 0.1 10*3/uL (ref 0.0–0.7)
Eosinophils Relative: 0 % (ref 0–5)
Lymphocytes Relative: 14 % (ref 12–46)
MCH: 28.3 pg (ref 26.0–34.0)
MCV: 86.8 fL (ref 78.0–100.0)
Platelets: 254 10*3/uL (ref 150–400)
RDW: 14.8 % (ref 11.5–15.5)
WBC: 13.5 10*3/uL — ABNORMAL HIGH (ref 4.0–10.5)

## 2012-03-17 LAB — HEMOGLOBIN A1C: Hgb A1c MFr Bld: 6.3 % — ABNORMAL HIGH (ref <5.7)

## 2012-03-17 MED ORDER — ONDANSETRON HCL 4 MG/2ML IJ SOLN
4.0000 mg | Freq: Once | INTRAMUSCULAR | Status: AC
  Administered 2012-03-17: 4 mg via INTRAVENOUS

## 2012-03-17 MED ORDER — DIAZEPAM 5 MG PO TABS: 5.0000 mg | ORAL_TABLET | Freq: Three times a day (TID) | ORAL | Status: DC | PRN

## 2012-03-17 MED ORDER — ONDANSETRON HCL 4 MG/2ML IJ SOLN
INTRAMUSCULAR | Status: AC
  Filled 2012-03-17: qty 2

## 2012-03-17 MED ORDER — ASPIRIN 300 MG RE SUPP: 300.0000 mg | Freq: Every day | RECTAL | Status: DC

## 2012-03-17 MED ORDER — ASPIRIN 325 MG PO TABS
325.0000 mg | ORAL_TABLET | Freq: Every day | ORAL | Status: DC
  Administered 2012-03-17: 325 mg via ORAL
  Filled 2012-03-17: qty 1

## 2012-03-17 MED ORDER — SODIUM CHLORIDE 0.9 % IV SOLN
1000.0000 mL | INTRAVENOUS | Status: DC
  Administered 2012-03-17: 1000 mL via INTRAVENOUS

## 2012-03-17 MED ORDER — MECLIZINE HCL 25 MG PO TABS
25.0000 mg | ORAL_TABLET | Freq: Once | ORAL | Status: AC
  Administered 2012-03-17: 25 mg via ORAL
  Filled 2012-03-17: qty 1

## 2012-03-17 MED ORDER — LORAZEPAM 2 MG/ML IJ SOLN
1.0000 mg | Freq: Once | INTRAMUSCULAR | Status: AC
  Administered 2012-03-17: 1 mg via INTRAVENOUS
  Filled 2012-03-17: qty 1

## 2012-03-17 MED ORDER — ACETAMINOPHEN 325 MG PO TABS: 650.0000 mg | ORAL_TABLET | ORAL | Status: DC | PRN

## 2012-03-17 NOTE — ED Provider Notes (Signed)
I was available during the CDU portion of this patients care for consultation  Gerhard Munch, MD 03/17/12 2351

## 2012-03-17 NOTE — Progress Notes (Signed)
Utilization review completed.  P.J. Veleda Mun,RN,BSN Case Manager [phone]  

## 2012-03-17 NOTE — ED Notes (Signed)
Pt lying on stretcher with HOB at approximately 45 degrees.  Pt denies any dizziness while still in bed.  Pt seated up slightly for swallow screen and tolerated well.  Pt reports 1.5 week h/o nasal congestion -has not taken any OTC medication.  Pt reports onset of L ear pain that began 2 hours after she came here, reports pain is now dull and stabbing to L ear.

## 2012-03-17 NOTE — Progress Notes (Signed)
  Echocardiogram 2D Echocardiogram has been performed.  [name] 03/17/2012, 9:43 AM

## 2012-03-17 NOTE — ED Notes (Signed)
Pt escorted to bathroom via wheelchair.  Pt tolerated well, but remains dizzy, especially with movement.

## 2012-03-17 NOTE — Progress Notes (Signed)
VASCULAR LAB PRELIMINARY  PRELIMINARY  PRELIMINARY  PRELIMINARY  Carotid Dopplers completed.    Preliminary report:  There is no ICA stenosis.  Vertebral artery flow is antegrade.  Adonis Ryther, 03/17/2012, 9:44 AM

## 2012-03-17 NOTE — ED Notes (Signed)
Patient transported to CT 

## 2012-03-17 NOTE — ED Provider Notes (Addendum)
History     CSN: [id number]  Arrival date & time 03/17/12  0453   First MD Initiated Contact with Patient 03/17/12 0500      Chief Complaint  Patient presents with  . Dizziness    (Consider location/radiation/quality/duration/timing/severity/associated sxs/prior treatment) Patient is a 46 y.o. female presenting with weakness. The history is provided by the patient.  Weakness The primary symptoms include dizziness, nausea and vomiting. The symptoms began less than 1 hour ago. The episode lasted 1 hour. The symptoms are unchanged.  Dizziness also occurs with nausea and vomiting. Dizziness does not occur with weakness.  Additional symptoms do not include neck stiffness, weakness, pain or lower back pain.    Past Medical History  Diagnosis Date  . Pneumonia     hx 20+ years ago  . Headache     hormonal headache  . Arthritis   . Neuropathy     bilateral feet  . Ulcer of foot     toe ulcer greater than a year ago  . Obese     Past Surgical History  Procedure Date  . Knee arthroscopy     left knee  . Replacement total knee     left  . Total knee arthroplasty 06/23/2011    Procedure: TOTAL KNEE ARTHROPLASTY;  Surgeon: Valeria Batman, MD;  Location: [facility] OR;  Service: Orthopedics;  Laterality: Right;  RIGHT TOTAL KNEE REPLACEMENT     Family History  Problem Relation Age of Onset  . Anesthesia problems Father   . Hypotension Neg Hx   . Malignant hyperthermia Neg Hx   . Pseudochol deficiency Neg Hx     History  Substance Use Topics  . Smoking status: Never Smoker   . Smokeless tobacco: Never Used  . Alcohol Use: No    OB History    Grav Para Term Preterm Abortions TAB SAB Ect Mult Living                  Review of Systems  HENT: Negative for neck stiffness.   Gastrointestinal: Positive for nausea and vomiting.  Neurological: Positive for dizziness. Negative for weakness.  All other systems reviewed and are negative.    Allergies  Review of patient's  allergies indicates no known allergies.  Home Medications   Current Outpatient Rx  Name  Route  Sig  Dispense  Refill  . B-12 PO   Oral   Take 1 tablet by mouth daily.         Marland Kitchen DICLOFENAC SODIUM 75 MG PO TBEC   Oral   Take 75 mg by mouth 2 (two) times daily.         . DULOXETINE HCL 20 MG PO CPEP   Oral   Take 20 mg by mouth daily.         Marland Kitchen FERROUS SULFATE 325 (65 FE) MG PO TABS   Oral   Take 325 mg by mouth daily with breakfast.         . FESOTERODINE FUMARATE ER 4 MG PO TB24   Oral   Take 4 mg by mouth daily.         Marland Kitchen GLUCOS-CHONDROIT-MSM COMPLEX PO   Oral   Take 1 tablet by mouth daily.         . ADULT MULTIVITAMIN W/MINERALS CH   Oral   Take 1 tablet by mouth daily.           BP 119/86  Pulse 86  Temp 97.3 F (36.3  C) (Oral)  Resp 18  SpO2 99%  Physical Exam  Constitutional: She is oriented to person, place, and time. She appears well-developed and well-nourished.  HENT:  Head: Normocephalic and atraumatic.  Eyes: Conjunctivae normal and EOM are normal. Pupils are equal, round, and reactive to light.  Neck: Normal range of motion.  Cardiovascular: Normal rate, regular rhythm and normal heart sounds.   Pulmonary/Chest: Effort normal and breath sounds normal.  Abdominal: Soft. Bowel sounds are normal.  Musculoskeletal: Normal range of motion.  Neurological: She is alert and oriented to person, place, and time.       + 3 beat nystagmus to rt  Skin: Skin is warm and dry.  Psychiatric: She has a normal mood and affect. Her behavior is normal.    ED Course  Procedures (including critical care time)  Labs Reviewed  CBC WITH DIFFERENTIAL - Abnormal; Notable for the following:    WBC 13.5 (*)     Neutrophils Relative 82 (*)     Neutro Abs 11.1 (*)     All other components within normal limits  COMPREHENSIVE METABOLIC PANEL  TROPONIN I   No results found.   No diagnosis found.   Date: 03/17/2012  Rate: 84  Rhythm: normal sinus  rhythm  QRS Axis: normal  Intervals: normal  ST/T Wave abnormalities: normal  Conduction Disutrbances: none  Narrative Interpretation: unremarkable     MDM  + vertigo,  No focal deficits.  Will meclizien,  Labs,  Ct head,  reassess   Ct neg.  Labs benign.  persistent vertigo alone.  No criteria for tpa administration.  Will place in cdu on tia protocol,  reassess     Rosanne Ashing, MD 03/17/12 0554  Sander Remedios Lytle Michaels, MD 03/17/12 4098  Rosanne Ashing, MD 03/17/12 [phone]

## 2012-03-17 NOTE — ED Notes (Signed)
Lunch meal given.  Pt tolerated well.

## 2012-03-17 NOTE — ED Notes (Signed)
Patient to MRI at this time.

## 2012-03-17 NOTE — ED Notes (Signed)
PT. REPORTS DIZZINESS / VOMITTED ( x4) ONSET LAST NIGHT , DENIES DIARRHEA /FEVER OR CHILLS.

## 2012-03-17 NOTE — ED Provider Notes (Signed)
12:53 PM Pt reports continued dizziness, room is described as "wobbling and spinning slowly" which is improved from this morning when the room was spinning fast.  Pt is A&O, NAD, CN II-XII intact, EOMs intact, no pronator drift, grip strengths equal bilaterally; strength 5/5 in all extremities, sensation intact in all extremities; finger to nose, heel to shin, rapid alternating movements normal; Did not assess gait as patient continues to be very dizzy and unsteady on her feet. Symptoms reproduced and exacerbated by turning head and moving eyes.  Discussed all results to this point with the patient.    2:37 PM Discussed patient, MRI and echo results with Dr Rulon Abide.  Plan is for symptom control, discharge home with vascular surgery follow up for possible right internal carotid aneurysm.  Also must have PCP follow up for hyperlipidemia and hyperglycemia.  Should be discharged home with symptomatic medications for vertigo. Hgb A1C also pending.    3:08 PM Pending reassessment and HgbA1C.  Will need to ambulate prior to discharge.  Patient signed out to Felicie Morn, NP, who assumes care of patient at change of shift. Pt to be d/c home with PCP and vascular surgery follow up.  Pt will need symptomatic treatment for home and information about the Epley maneuver.    Filed Vitals:   03/17/12 1428  BP: 112/52  Pulse: 101  Temp:   Resp: 17     Results for orders placed during the hospital encounter of 03/17/12  CBC WITH DIFFERENTIAL      Component Value Range   WBC 13.5 (*) 4.0 - 10.5 K/uL   RBC 4.24  3.87 - 5.11 MIL/uL   Hemoglobin 12.0  12.0 - 15.0 g/dL   HCT 16.1  09.6 - 04.5 %   MCV 86.8  78.0 - 100.0 fL   MCH 28.3  26.0 - 34.0 pg   MCHC 32.6  30.0 - 36.0 g/dL   RDW 40.9  81.1 - 91.4 %   Platelets 254  150 - 400 K/uL   Neutrophils Relative 82 (*) 43 - 77 %   Neutro Abs 11.1 (*) 1.7 - 7.7 K/uL   Lymphocytes Relative 14  12 - 46 %   Lymphs Abs 1.8  0.7 - 4.0 K/uL   Monocytes Relative 3  3 - 12 %    Monocytes Absolute 0.5  0.1 - 1.0 K/uL   Eosinophils Relative 0  0 - 5 %   Eosinophils Absolute 0.1  0.0 - 0.7 K/uL   Basophils Relative 0  0 - 1 %   Basophils Absolute 0.0  0.0 - 0.1 K/uL  COMPREHENSIVE METABOLIC PANEL      Component Value Range   Sodium 139  135 - 145 mEq/L   Potassium 4.0  3.5 - 5.1 mEq/L   Chloride 100  96 - 112 mEq/L   CO2 23  19 - 32 mEq/L   Glucose, Bld 205 (*) 70 - 99 mg/dL   BUN 17  6 - 23 mg/dL   Creatinine, Ser 7.82  0.50 - 1.10 mg/dL   Calcium 9.5  8.4 - 95.6 mg/dL   Total Protein 7.1  6.0 - 8.3 g/dL   Albumin 3.5  3.5 - 5.2 g/dL   AST 16  0 - 37 U/L   ALT 17  0 - 35 U/L   Alkaline Phosphatase 71  39 - 117 U/L   Total Bilirubin 0.3  0.3 - 1.2 mg/dL   GFR calc non Af Amer >90  >90 mL/min  GFR calc Af Amer >90  >90 mL/min  TROPONIN I      Component Value Range   Troponin I <0.30  <0.30 ng/mL  CBC      Component Value Range   WBC 11.3 (*) 4.0 - 10.5 K/uL   RBC 4.04  3.87 - 5.11 MIL/uL   Hemoglobin 11.4 (*) 12.0 - 15.0 g/dL   HCT 16.1 (*) 09.6 - 04.5 %   MCV 86.6  78.0 - 100.0 fL   MCH 28.2  26.0 - 34.0 pg   MCHC 32.6  30.0 - 36.0 g/dL   RDW 40.9  81.1 - 91.4 %   Platelets 222  150 - 400 K/uL  COMPREHENSIVE METABOLIC PANEL      Component Value Range   Sodium 139  135 - 145 mEq/L   Potassium 4.1  3.5 - 5.1 mEq/L   Chloride 99  96 - 112 mEq/L   CO2 25  19 - 32 mEq/L   Glucose, Bld 184 (*) 70 - 99 mg/dL   BUN 16  6 - 23 mg/dL   Creatinine, Ser 7.82  0.50 - 1.10 mg/dL   Calcium 9.4  8.4 - 95.6 mg/dL   Total Protein 7.0  6.0 - 8.3 g/dL   Albumin 3.5  3.5 - 5.2 g/dL   AST 16  0 - 37 U/L   ALT 17  0 - 35 U/L   Alkaline Phosphatase 70  39 - 117 U/L   Total Bilirubin 0.4  0.3 - 1.2 mg/dL   GFR calc non Af Amer >90  >90 mL/min   GFR calc Af Amer >90  >90 mL/min  PROTIME-INR      Component Value Range   Prothrombin Time 13.2  11.6 - 15.2 seconds   INR 1.01  0.00 - 1.49  APTT      Component Value Range   aPTT 25  24 - 37 seconds  LIPID  PANEL      Component Value Range   Cholesterol 220 (*) 0 - 200 mg/dL   Triglycerides 213 (*) <150 mg/dL   HDL 37 (*) >08 mg/dL   Total CHOL/HDL Ratio 5.9     VLDL UNABLE TO CALCULATE IF TRIGLYCERIDE OVER 400 mg/dL  0 - 40 mg/dL   LDL Cholesterol UNABLE TO CALCULATE IF TRIGLYCERIDE OVER 400 mg/dL  0 - 99 mg/dL   Ct Head Wo Contrast  03/17/2012  *RADIOLOGY REPORT*  Clinical Data: Dizziness  CT HEAD WITHOUT CONTRAST  Technique:  Contiguous axial images were obtained from the base of the skull through the vertex without contrast.  Comparison: None.  Findings: There is no evidence for acute hemorrhage, hydrocephalus, mass lesion, or abnormal extra-axial fluid collection.  No definite CT evidence for acute infarction.  The visualized paranasal sinuses and mastoid air cells are predominately clear.  IMPRESSION: No acute intracranial abnormality.   Original Report Authenticated By: Jearld Lesch, M.D.    Mr Brain Wo Contrast  03/17/2012  *RADIOLOGY REPORT*  Clinical Data:  Dizziness.  MRI BRAIN WITHOUT CONTRAST MRA HEAD WITHOUT CONTRAST  Technique: Multiplanar, multiecho pulse sequences of the brain and surrounding structures were obtained according to standard protocol without intravenous contrast.  Angiographic images of the head were obtained using MRA technique without contrast.  Comparison: 03/17/2012 CT.  MRI HEAD  Findings:  Motion degraded exam.  No acute infarct.  No intracranial hemorrhage.  No intracranial mass lesion detected on this unenhanced motion degraded exam.  No hydrocephalus.  Major intracranial vascular  structures are patent.  Cervical medullary junction, pituitary region, pineal region and orbital structures unremarkable.  Pituitary gland top normal in size for the patient's age and gender.  IMPRESSION: Motion degraded examination without evidence of acute infarct. Please see above.  MRA HEAD  Findings: Motion degraded exam.  This slightly limits evaluation for grading stenosis or  detecting aneurysm.  Anterior circulation without obvious significant stenosis or occlusion of medium or large size vessels.  Narrowing of the A1 segment of the right anterior cerebral artery not excluded.  Middle cerebral artery branch vessel irregularity.  Tiny aneurysm of the cavernous segment of the right internal carotid artery may be present versus motion artifact.  No significant stenosis of the vertebral arteries or basilar artery.  Probable artifact distal right vertebral artery.  Nonvisualization left PICA and poor delineation of the AICAs.  Mild irregularity superior cerebellar arteries more notable on the right.  IMPRESSION: Motion degraded examination without large vessel significant stenosis or occlusion.  Aneurysm of the cavernous segment of the right internal carotid artery not excluded although appearance may be related to artifact.   Original Report Authenticated By: Lacy Duverney, M.D.    Mr Mra Head/brain Wo Cm  03/17/2012  Please see MR brain report.   Original Report Authenticated By: Lacy Duverney, M.D.       [place], [place] 03/17/12 1514

## 2012-03-17 NOTE — ED Notes (Signed)
Pt states she was working late and that when she got home she became very dizzy. States that any movement makes her more dizzy and that the dizziness is making her nauseated  Husband at bedside. Pt denies any pain.

## 2012-03-17 NOTE — ED Provider Notes (Signed)
Labs reviewed.  Calculated LDL (based on total cholesterol, HDL, and triglycerides) is 96.4.  Hbg A1C 6.3.  Vertigo symptoms have improved. Patient is able to ambulate to the bathroom without assistance and without significant dizziness.  Lab and radiology results discussed with patient.  Will discharge home with prescription for valium as the benzodiazepine was effective for symptom control.  Patient to follow-up with her PCP as well as vascular surgeon.    Jimmye Norman, NP 03/17/12 2004

## 2012-03-18 NOTE — ED Provider Notes (Signed)
Medical screening examination/treatment/procedure(s) were performed by non-physician practitioner and as supervising physician I was immediately available for consultation/collaboration.  Rosanne Ashing, MD 03/18/12 [phone]

## 2013-01-30 ENCOUNTER — Inpatient Hospital Stay (HOSPITAL_COMMUNITY): Payer: BC Managed Care – PPO

## 2013-01-30 ENCOUNTER — Inpatient Hospital Stay (HOSPITAL_COMMUNITY)
Admission: AD | Admit: 2013-01-30 | Discharge: 2013-02-02 | DRG: 558 | Disposition: A | Discharge status: Home Health | Payer: BC Managed Care – PPO | Source: Ambulatory Visit | Attending: Orthopaedic Surgery | Admitting: Orthopaedic Surgery

## 2013-01-30 DIAGNOSIS — Y831 Surgical operation with implant of artificial internal device as the cause of abnormal reaction of the patient, or of later complication, without mention of misadventure at the time of the procedure: Secondary | ICD-10-CM | POA: Diagnosis present

## 2013-01-30 DIAGNOSIS — T8459XA Infection and inflammatory reaction due to other internal joint prosthesis, initial encounter: Secondary | ICD-10-CM

## 2013-01-30 DIAGNOSIS — M009 Pyogenic arthritis, unspecified: Secondary | ICD-10-CM | POA: Diagnosis present

## 2013-01-30 DIAGNOSIS — Z79899 Other long term (current) drug therapy: Secondary | ICD-10-CM

## 2013-01-30 DIAGNOSIS — Z6841 Body Mass Index (BMI) 40.0 and over, adult: Secondary | ICD-10-CM

## 2013-01-30 DIAGNOSIS — T8450XA Infection and inflammatory reaction due to unspecified internal joint prosthesis, initial encounter: Principal | ICD-10-CM | POA: Diagnosis present

## 2013-01-30 DIAGNOSIS — E1169 Type 2 diabetes mellitus with other specified complication: Secondary | ICD-10-CM | POA: Diagnosis present

## 2013-01-30 DIAGNOSIS — B951 Streptococcus, group B, as the cause of diseases classified elsewhere: Secondary | ICD-10-CM | POA: Diagnosis present

## 2013-01-30 DIAGNOSIS — E119 Type 2 diabetes mellitus without complications: Secondary | ICD-10-CM | POA: Diagnosis present

## 2013-01-30 DIAGNOSIS — Z7901 Long term (current) use of anticoagulants: Secondary | ICD-10-CM

## 2013-01-30 DIAGNOSIS — G5790 Unspecified mononeuropathy of unspecified lower limb: Secondary | ICD-10-CM | POA: Diagnosis present

## 2013-01-30 DIAGNOSIS — L97509 Non-pressure chronic ulcer of other part of unspecified foot with unspecified severity: Secondary | ICD-10-CM | POA: Diagnosis present

## 2013-01-30 DIAGNOSIS — Z96659 Presence of unspecified artificial knee joint: Secondary | ICD-10-CM

## 2013-01-30 LAB — COMPREHENSIVE METABOLIC PANEL
AST: 16 U/L (ref 0–37)
Alkaline Phosphatase: 83 U/L (ref 39–117)
BUN: 8 mg/dL (ref 6–23)
CO2: 23 mEq/L (ref 19–32)
Calcium: 8.9 mg/dL (ref 8.4–10.5)
Chloride: 95 mEq/L — ABNORMAL LOW (ref 96–112)
Creatinine, Ser: 0.54 mg/dL (ref 0.50–1.10)
GFR calc Af Amer: >90 mL/min (ref >90)
GFR calc non Af Amer: >90 mL/min (ref >90)
Glucose, Bld: 249 mg/dL — ABNORMAL HIGH (ref 70–99)
Total Bilirubin: 0.8 mg/dL (ref 0.3–1.2)

## 2013-01-30 LAB — CBC
HCT: 31.1 % — ABNORMAL LOW (ref 36.0–46.0)
Hemoglobin: 10.4 g/dL — ABNORMAL LOW (ref 12.0–15.0)
MCH: 29.4 pg (ref 26.0–34.0)
MCV: 87.9 fL (ref 78.0–100.0)
Platelets: 184 10*3/uL (ref 150–400)
RBC: 3.54 MIL/uL — ABNORMAL LOW (ref 3.87–5.11)
WBC: 8.9 10*3/uL (ref 4.0–10.5)

## 2013-01-30 LAB — URINALYSIS, ROUTINE W REFLEX MICROSCOPIC
Glucose, UA: NEGATIVE mg/dL
Hgb urine dipstick: NEGATIVE
Ketones, ur: 15 mg/dL — AB
Nitrite: NEGATIVE
Urobilinogen, UA: 1 mg/dL (ref 0.0–1.0)
pH: 5.5 (ref 5.0–8.0)

## 2013-01-30 LAB — URINE MICROSCOPIC-ADD ON

## 2013-01-30 LAB — TYPE AND SCREEN: ABO/RH(D): O POS

## 2013-01-30 LAB — PROTIME-INR: INR: 1.15 (ref 0.00–1.49)

## 2013-01-30 LAB — SEDIMENTATION RATE: Sed Rate: 113 mm/hr — ABNORMAL HIGH (ref 0–22)

## 2013-01-30 MED ORDER — HYDROMORPHONE HCL PF 1 MG/ML IJ SOLN
0.5000 mg | INTRAMUSCULAR | Status: DC | PRN
  Administered 2013-01-31 (×2): 1 mg via INTRAVENOUS
  Filled 2013-01-30 (×2): qty 1

## 2013-01-30 MED ORDER — DULOXETINE HCL 60 MG PO CPEP
60.0000 mg | ORAL_CAPSULE | Freq: Every day | ORAL | Status: DC
  Administered 2013-01-31: 60 mg via ORAL
  Filled 2013-01-30 (×2): qty 1

## 2013-01-30 MED ORDER — OXYCODONE HCL 5 MG PO TABS
5.0000 mg | ORAL_TABLET | ORAL | Status: DC | PRN
  Administered 2013-01-30 – 2013-01-31 (×7): 10 mg via ORAL
  Filled 2013-01-30 (×5): qty 2
  Filled 2013-01-30 (×2): qty 1
  Filled 2013-01-30: qty 2

## 2013-01-30 MED ORDER — VANCOMYCIN HCL IN DEXTROSE 1-5 GM/200ML-% IV SOLN
1000.0000 mg | Freq: Two times a day (BID) | INTRAVENOUS | Status: DC
  Administered 2013-01-31: 1000 mg via INTRAVENOUS
  Filled 2013-01-30 (×2): qty 200

## 2013-01-30 MED ORDER — ACETAMINOPHEN 325 MG PO TABS
650.0000 mg | ORAL_TABLET | Freq: Four times a day (QID) | ORAL | Status: DC | PRN
  Administered 2013-01-30 – 2013-01-31 (×4): 650 mg via ORAL
  Filled 2013-01-30 (×4): qty 2

## 2013-01-30 MED ORDER — SODIUM CHLORIDE 0.9 % IV SOLN
2500.0000 mg | Freq: Once | INTRAVENOUS | Status: AC
  Administered 2013-01-30: 2500 mg via INTRAVENOUS
  Filled 2013-01-30: qty 2500

## 2013-01-30 MED ORDER — SODIUM CHLORIDE 0.9 % IV SOLN
INTRAVENOUS | Status: DC
  Administered 2013-01-31: 12:00:00 via INTRAVENOUS

## 2013-01-30 NOTE — Consult Note (Signed)
PHARMACY CONSULT NOTE - INITIAL  Pharmacy Consult for :   Vancomycin Indication:  Septic R-Total Knee Replacement  Allergies: No Known Allergies  Patient Measurements: Height: 5' 4.17" (163 cm) Dosing Weight: 301 lb 2.4 oz (136.6 kg) IBW/kg (Calculated) : 55.1  Vital Signs: BP 127/62  Pulse 74  Temp(Src) 101.2 F (38.4 C) (Oral)  Ht 5' 4.17" (1.63 m)  Wt 301 lb 2.4 oz (136.6 kg)  BMI 51.41 kg/m2  SpO2 100%  Labs: No results found for this basename: WBC, HGB, PLT, LABCREA, CREATININE,  in the last 72 hours Estimated Creatinine Clearance: 120.4 ml/min (by C-G formula based on Cr of 0.54).   Microbiology: No results found for this or any previous visit (from the past 720 hour(s)).  Medical/Surgical History: Past Medical History  Diagnosis Date  . Pneumonia     hx 20+ years ago  . Headache     hormonal headache  . Arthritis   . Neuropathy     bilateral feet  . Ulcer of foot     toe ulcer greater than a year ago  . Obese    Past Surgical History  Procedure Laterality Date  . Knee arthroscopy      left knee  . Replacement total knee      left  . Total knee arthroplasty  06/23/2011    Procedure: TOTAL KNEE ARTHROPLASTY;  Surgeon: Valeria Batman, MD;  Location: [facility] OR;  Service: Orthopedics;  Laterality: Right;  RIGHT TOTAL KNEE REPLACEMENT     Medications:  Prescriptions prior to admission  Medication Sig Dispense Refill  . Cyanocobalamin (B-12 PO) Take 1 tablet by mouth daily.      . diazepam (VALIUM) 5 MG tablet Take 1 tablet (5 mg total) by mouth every 8 (eight) hours as needed (vertigo).  15 tablet  0  . diclofenac (VOLTAREN) 75 MG EC tablet Take 75 mg by mouth 2 (two) times daily.      . DULoxetine (CYMBALTA) 20 MG capsule Take 20 mg by mouth daily.      . ferrous sulfate 325 (65 FE) MG tablet Take 325 mg by mouth daily with breakfast.      . fesoterodine (TOVIAZ) 4 MG TB24 Take 4 mg by mouth daily.      . Misc Natural Products (GLUCOS-CHONDROIT-MSM  COMPLEX PO) Take 1 tablet by mouth daily.      . Multiple Vitamin (MULTIVITAMIN WITH MINERALS) TABS Take 1 tablet by mouth daily.       Scheduled:  . DULoxetine  60 mg Oral Daily    Assessment:  46 y/o morbidly obese female admitted with Septic R-Total Knee Replacement  Goal of Therapy:   Vancomycin trough level 15-20 mcg/ml  Plan:   Vancomycin 2500 mg IV x 1, then Vancomycin 1 gm IV q 12hr. Follow up SCr, UOP, cultures, clinical course and adjust as clinically indicated.  Lenoir Facchini, Elisha Headland,  Pharm.D.,    10/6/20146:36 PM

## 2013-01-30 NOTE — H&P (Signed)
[name] Campbell, MD   [name] Code, PA-C [address], [place], [place]  [address]                             [phone]   ORTHOPAEDIC HISTORY & PHYSICAL  [name] MRN:  [id number] DOB/SEX:  [birth year]/female  CHIEF COMPLAINT:  Painful right Knee  HISTORY: Patient is a 47 y.o. female presented with a history of pain in the right knee for 3 days. Onset of symptoms was abrupt starting Friday evening with rapidly worsening course since that time. Prior procedure on the knee was total knee arthroplasty in February 2012. Patient was seen in the office today with elevation of temperature to 101 degrees, loss of appetite, and significant pain in the right TKR.  She does have an abrasion on her right calf from a saddle rubbing the leg.  Aspiration  Of the knee revealed yellow thick cloudy material sent for cell count and C&S.  Admitted to hospital for IV antibiotics and I&D of the right TKR.   PAST MEDICAL HISTORY: Patient Active Problem List   Diagnosis Date Noted  . 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 Postoperative anemia due to acute blood loss 06/25/2011  . Hyperglycemia 06/25/2011  . Osteoarthritis of knee 06/23/2011  . Obesity, morbid (more than 100 lbs over ideal weight or BMI > 40) 06/23/2011   Past Medical History  Diagnosis Date  . Pneumonia     hx 20+ years ago  . Headache     hormonal headache  . Arthritis   . Neuropathy     bilateral feet  . Ulcer of foot     toe ulcer greater than a year ago  . Obese    Past Surgical History  Procedure Laterality Date  . Knee arthroscopy      left knee  . Replacement total knee      left  . Total knee arthroplasty  06/23/2011    Procedure: TOTAL KNEE ARTHROPLASTY;  Surgeon: Valeria Batman, MD;  Location: [facility] OR;  Service: Orthopedics;  Laterality: Right;  RIGHT TOTAL KNEE REPLACEMENT      MEDICATIONS:   Prescriptions prior to admission  Medication Sig Dispense Refill  . DULoxetine  (CYMBALTA) 60 MG capsule Take 60 mg by mouth daily.      Marland Kitchen etodolac (LODINE) 500 MG tablet Take 500 mg by mouth 2 (two) times daily.      Marland Kitchen HYDROcodone-acetaminophen (NORCO) 10-325 MG per tablet Take 1 tablet by mouth every 6 (six) hours as needed for pain.       . Multiple Vitamin (MULTIVITAMIN WITH MINERALS) TABS Take 1 tablet by mouth daily.        ALLERGIES:  No Known Allergies  REVIEW OF SYSTEMS:  A comprehensive review of systems was negative except for: possible sleep apnea, gout, and irregular menses   FAMILY HISTORY:   Family History  Problem Relation Age of Onset  . Anesthesia problems Father   . Hypotension Neg Hx   . Malignant hyperthermia Neg Hx   . Pseudochol deficiency Neg Hx     SOCIAL HISTORY:   History  Substance Use Topics  . Smoking status: Never Smoker   . Smokeless tobacco: Never Used  . Alcohol Use: No      EXAMINATION: Vital signs in last 24 hours: Temp:  [100.8 F (38.2 C)-101.2 F (38.4 C)] 100.8 F (38.2 C) (10/06 2133) Pulse  Rate:  [74-98] 98 (10/06 2133) Resp:  [18] 18 (10/06 2133) BP: (105-127)/(49-62) 105/49 mmHg (10/06 2133) SpO2:  [100 %] 100 % (10/06 2133) Weight:  [136.6 kg (301 lb 2.4 oz)] 136.6 kg (301 lb 2.4 oz) (10/06 1829)  Head is normocephalic.   Eyes:  Pupils equal, round and reactive to light and accommodation.  Extraocular intact. ENT: Ears, nose, and throat were benign.   Neck: supple, no bruits were noted.   Chest: good expansion.   Lungs: essentially clear.   Cardiac: regular rhythm and rate, normal S1, S2.  No murmurs appreciated. Pulses :  1+ bilateral and symmetric in lower extremities. Abdomen: is scaphoid, soft, nontender, no masses palpable, normal bowel sounds present. CNS:  He is oriented x3 and cranial nerves II-XII grossly intact. Breast, rectal, and genital exams: not performed and not indicated for an orthopedic evaluation. Musculoskeletal:     ASSESSMENT: Infected total knee replacement, right knee    Past Medical History  Diagnosis Date  . Pneumonia     hx 20+ years ago  . Headache     hormonal headache  . Arthritis   . Neuropathy     bilateral feet  . Ulcer of foot     toe ulcer greater than a year ago  . Obese     PLAN: Plan for right total knee replacement I&D, poly exchange   [name] 01/31/2013, 12:37 PM

## 2013-01-31 ENCOUNTER — Encounter (HOSPITAL_COMMUNITY)
Admission: AD | Disposition: A | Discharge status: Home Health | Payer: Self-pay | Source: Ambulatory Visit | Attending: Orthopaedic Surgery

## 2013-01-31 ENCOUNTER — Inpatient Hospital Stay (HOSPITAL_COMMUNITY): Payer: BC Managed Care – PPO | Admitting: Anesthesiology

## 2013-01-31 ENCOUNTER — Encounter (HOSPITAL_COMMUNITY): Payer: Self-pay | Admitting: Anesthesiology

## 2013-01-31 ENCOUNTER — Encounter (HOSPITAL_COMMUNITY): Payer: Self-pay | Admitting: Certified Registered"

## 2013-01-31 ENCOUNTER — Inpatient Hospital Stay: Admit: 2013-01-31 | Payer: Self-pay | Admitting: Orthopaedic Surgery

## 2013-01-31 DIAGNOSIS — T8450XA Infection and inflammatory reaction due to unspecified internal joint prosthesis, initial encounter: Secondary | ICD-10-CM

## 2013-01-31 HISTORY — PX: I&D KNEE WITH POLY EXCHANGE: SHX5024

## 2013-01-31 HISTORY — PX: INCISION AND DRAINAGE: SHX5863

## 2013-01-31 LAB — BASIC METABOLIC PANEL
CO2: 30 mEq/L (ref 19–32)
Calcium: 8.8 mg/dL (ref 8.4–10.5)
Creatinine, Ser: 0.67 mg/dL (ref 0.50–1.10)
GFR calc Af Amer: >90 mL/min (ref >90)
GFR calc non Af Amer: >90 mL/min (ref >90)
Glucose, Bld: 232 mg/dL — ABNORMAL HIGH (ref 70–99)
Potassium: 3.9 mEq/L (ref 3.5–5.1)
Sodium: 135 mEq/L (ref 135–145)

## 2013-01-31 LAB — SURGICAL PCR SCREEN
MRSA, PCR: NEGATIVE
Staphylococcus aureus: POSITIVE — AB

## 2013-01-31 LAB — HEMOGLOBIN A1C
Hgb A1c MFr Bld: 7.8 % — ABNORMAL HIGH (ref <5.7)
Mean Plasma Glucose: 177 mg/dL — ABNORMAL HIGH (ref <117)

## 2013-01-31 LAB — URINE CULTURE

## 2013-01-31 SURGERY — IRRIGATION AND DEBRIDEMENT KNEE WITH POLY EXCHANGE
Anesthesia: General | Site: Knee | Laterality: Right | Wound class: Clean

## 2013-01-31 MED ORDER — HYDROMORPHONE HCL PF 1 MG/ML IJ SOLN
0.5000 mg | INTRAMUSCULAR | Status: DC | PRN
  Administered 2013-02-01: 0.5 mg via INTRAVENOUS
  Filled 2013-01-31: qty 1

## 2013-01-31 MED ORDER — SODIUM CHLORIDE 0.9 % IV SOLN
75.0000 mL/h | INTRAVENOUS | Status: DC
  Administered 2013-02-01: 75 mL/h via INTRAVENOUS

## 2013-01-31 MED ORDER — MUPIROCIN 2 % EX OINT
1.0000 "application " | TOPICAL_OINTMENT | Freq: Two times a day (BID) | CUTANEOUS | Status: DC
  Administered 2013-01-31 – 2013-02-01 (×3): 1 via NASAL
  Filled 2013-01-31: qty 22

## 2013-01-31 MED ORDER — ALUM & MAG HYDROXIDE-SIMETH 200-200-20 MG/5ML PO SUSP: 30.0000 mL | ORAL | Status: DC | PRN

## 2013-01-31 MED ORDER — SODIUM CHLORIDE 0.9 % IV SOLN
INTRAVENOUS | Status: DC | PRN
  Administered 2013-01-31: 20:00:00 via INTRAVENOUS

## 2013-01-31 MED ORDER — ONDANSETRON HCL 4 MG/2ML IJ SOLN: 4.0000 mg | Freq: Four times a day (QID) | INTRAMUSCULAR | Status: DC | PRN

## 2013-01-31 MED ORDER — PHENOL 1.4 % MT LIQD: 1.0000 | OROMUCOSAL | Status: DC | PRN

## 2013-01-31 MED ORDER — INSULIN ASPART 100 UNIT/ML ~~LOC~~ SOLN
0.0000 [IU] | Freq: Three times a day (TID) | SUBCUTANEOUS | Status: DC
  Administered 2013-02-01: 5 [IU] via SUBCUTANEOUS
  Administered 2013-02-01: 3 [IU] via SUBCUTANEOUS
  Administered 2013-02-01: 5 [IU] via SUBCUTANEOUS
  Administered 2013-02-02: 3 [IU] via SUBCUTANEOUS

## 2013-01-31 MED ORDER — HYDROMORPHONE HCL PF 1 MG/ML IJ SOLN
INTRAMUSCULAR | Status: AC
  Filled 2013-01-31: qty 1

## 2013-01-31 MED ORDER — ONDANSETRON HCL 4 MG/2ML IJ SOLN
INTRAMUSCULAR | Status: DC | PRN
  Administered 2013-01-31: 4 mg via INTRAMUSCULAR

## 2013-01-31 MED ORDER — DULOXETINE HCL 60 MG PO CPEP
60.0000 mg | ORAL_CAPSULE | Freq: Every day | ORAL | Status: DC
  Administered 2013-02-01 – 2013-02-02 (×2): 60 mg via ORAL
  Filled 2013-01-31 (×2): qty 1

## 2013-01-31 MED ORDER — CHLORHEXIDINE GLUCONATE CLOTH 2 % EX PADS
6.0000 | MEDICATED_PAD | Freq: Every day | CUTANEOUS | Status: DC
  Administered 2013-01-31 – 2013-02-01 (×2): 6 via TOPICAL

## 2013-01-31 MED ORDER — SODIUM CHLORIDE 0.9 % IR SOLN
Status: DC | PRN
  Administered 2013-01-31: 3000 mL

## 2013-01-31 MED ORDER — OXYCODONE HCL 5 MG PO TABS
5.0000 mg | ORAL_TABLET | Freq: Once | ORAL | Status: AC | PRN
  Administered 2013-01-31: 5 mg via ORAL

## 2013-01-31 MED ORDER — BISACODYL 10 MG RE SUPP: 10.0000 mg | Freq: Every day | RECTAL | Status: DC | PRN

## 2013-01-31 MED ORDER — SUFENTANIL CITRATE 50 MCG/ML IV SOLN
INTRAVENOUS | Status: DC | PRN
  Administered 2013-01-31 (×5): 10 ug via INTRAVENOUS

## 2013-01-31 MED ORDER — SUCCINYLCHOLINE CHLORIDE 20 MG/ML IJ SOLN
INTRAMUSCULAR | Status: DC | PRN
  Administered 2013-01-31: 120 mg via INTRAVENOUS

## 2013-01-31 MED ORDER — LACTATED RINGERS IV SOLN
INTRAVENOUS | Status: DC
  Administered 2013-01-31: 17:00:00 via INTRAVENOUS

## 2013-01-31 MED ORDER — KETOROLAC TROMETHAMINE 15 MG/ML IJ SOLN
15.0000 mg | Freq: Four times a day (QID) | INTRAMUSCULAR | Status: AC
  Administered 2013-02-01 (×2): 15 mg via INTRAVENOUS
  Filled 2013-01-31 (×2): qty 1

## 2013-01-31 MED ORDER — ACETAMINOPHEN 650 MG RE SUPP: 650.0000 mg | Freq: Four times a day (QID) | RECTAL | Status: DC | PRN

## 2013-01-31 MED ORDER — MAGNESIUM HYDROXIDE 400 MG/5ML PO SUSP: 30.0000 mL | Freq: Every day | ORAL | Status: DC | PRN

## 2013-01-31 MED ORDER — ONDANSETRON HCL 4 MG PO TABS: 4.0000 mg | ORAL_TABLET | Freq: Four times a day (QID) | ORAL | Status: DC | PRN

## 2013-01-31 MED ORDER — METHOCARBAMOL 500 MG PO TABS
ORAL_TABLET | ORAL | Status: AC
  Filled 2013-01-31: qty 1

## 2013-01-31 MED ORDER — RIVAROXABAN 10 MG PO TABS
10.0000 mg | ORAL_TABLET | Freq: Every day | ORAL | Status: DC
  Administered 2013-02-01 – 2013-02-02 (×2): 10 mg via ORAL
  Filled 2013-01-31 (×3): qty 1

## 2013-01-31 MED ORDER — PROPOFOL 10 MG/ML IV BOLUS
INTRAVENOUS | Status: DC | PRN
  Administered 2013-01-31: 160 mg via INTRAVENOUS

## 2013-01-31 MED ORDER — DOCUSATE SODIUM 100 MG PO CAPS
100.0000 mg | ORAL_CAPSULE | Freq: Two times a day (BID) | ORAL | Status: DC
  Administered 2013-02-01 – 2013-02-02 (×4): 100 mg via ORAL
  Filled 2013-01-31 (×5): qty 1

## 2013-01-31 MED ORDER — OXYCODONE HCL 5 MG PO TABS
5.0000 mg | ORAL_TABLET | ORAL | Status: DC | PRN
  Administered 2013-02-01 – 2013-02-02 (×9): 10 mg via ORAL
  Filled 2013-01-31 (×10): qty 2

## 2013-01-31 MED ORDER — MENTHOL 3 MG MT LOZG: 1.0000 | LOZENGE | OROMUCOSAL | Status: DC | PRN

## 2013-01-31 MED ORDER — MIDAZOLAM HCL 5 MG/5ML IJ SOLN
INTRAMUSCULAR | Status: DC | PRN
  Administered 2013-01-31: 2 mg via INTRAVENOUS

## 2013-01-31 MED ORDER — LIDOCAINE HCL (CARDIAC) 20 MG/ML IV SOLN
INTRAVENOUS | Status: DC | PRN
  Administered 2013-01-31: 100 mg via INTRAVENOUS

## 2013-01-31 MED ORDER — OXYCODONE HCL 5 MG PO TABS
ORAL_TABLET | ORAL | Status: AC
  Filled 2013-01-31: qty 1

## 2013-01-31 MED ORDER — BUPIVACAINE-EPINEPHRINE (PF) 0.5% -1:200000 IJ SOLN
INTRAMUSCULAR | Status: AC
  Filled 2013-01-31: qty 10

## 2013-01-31 MED ORDER — INSULIN ASPART 100 UNIT/ML ~~LOC~~ SOLN: 0.0000 [IU] | Freq: Every day | SUBCUTANEOUS | Status: DC

## 2013-01-31 MED ORDER — ACETAMINOPHEN 325 MG PO TABS
650.0000 mg | ORAL_TABLET | Freq: Four times a day (QID) | ORAL | Status: DC | PRN
  Administered 2013-02-02: 650 mg via ORAL
  Filled 2013-01-31: qty 2

## 2013-01-31 MED ORDER — FLEET ENEMA 7-19 GM/118ML RE ENEM: 1.0000 | ENEMA | Freq: Once | RECTAL | Status: AC | PRN

## 2013-01-31 MED ORDER — OXYCODONE HCL 5 MG/5ML PO SOLN: 5.0000 mg | Freq: Once | ORAL | Status: AC | PRN

## 2013-01-31 MED ORDER — LACTATED RINGERS IV SOLN
INTRAVENOUS | Status: DC | PRN
  Administered 2013-01-31: 19:00:00 via INTRAVENOUS

## 2013-01-31 MED ORDER — METHOCARBAMOL 100 MG/ML IJ SOLN
500.0000 mg | Freq: Four times a day (QID) | INTRAVENOUS | Status: DC | PRN
  Filled 2013-01-31: qty 5

## 2013-01-31 MED ORDER — PROMETHAZINE HCL 25 MG/ML IJ SOLN: 6.2500 mg | INTRAMUSCULAR | Status: DC | PRN

## 2013-01-31 MED ORDER — METHOCARBAMOL 500 MG PO TABS
500.0000 mg | ORAL_TABLET | Freq: Four times a day (QID) | ORAL | Status: DC | PRN
  Administered 2013-01-31: 500 mg via ORAL
  Filled 2013-01-31: qty 1

## 2013-01-31 MED ORDER — HYDROMORPHONE HCL PF 1 MG/ML IJ SOLN
0.2500 mg | INTRAMUSCULAR | Status: DC | PRN
  Administered 2013-01-31 (×3): 0.5 mg via INTRAVENOUS

## 2013-01-31 MED ORDER — METOCLOPRAMIDE HCL 10 MG PO TABS: 5.0000 mg | ORAL_TABLET | Freq: Three times a day (TID) | ORAL | Status: DC | PRN

## 2013-01-31 MED ORDER — METOCLOPRAMIDE HCL 5 MG/ML IJ SOLN: 5.0000 mg | Freq: Three times a day (TID) | INTRAMUSCULAR | Status: DC | PRN

## 2013-01-31 MED ORDER — VANCOMYCIN HCL 10 G IV SOLR
1250.0000 mg | Freq: Two times a day (BID) | INTRAVENOUS | Status: DC
  Administered 2013-01-31 – 2013-02-02 (×4): 1250 mg via INTRAVENOUS
  Filled 2013-01-31 (×6): qty 1250

## 2013-01-31 SURGICAL SUPPLY — 42 items
BAG DECANTER FOR FLEXI CONT (MISCELLANEOUS) ×2 IMPLANT
BANDAGE ELASTIC 4 VELCRO ST LF (GAUZE/BANDAGES/DRESSINGS) ×2 IMPLANT
BANDAGE GAUZE ELAST BULKY 4 IN (GAUZE/BANDAGES/DRESSINGS) ×2 IMPLANT
BNDG COHESIVE 4X5 TAN STRL (GAUZE/BANDAGES/DRESSINGS) ×2 IMPLANT
CLOTH BEACON ORANGE TIMEOUT ST (SAFETY) ×2 IMPLANT
CUFF TOURNIQUET SINGLE 34IN LL (TOURNIQUET CUFF) IMPLANT
CUFF TOURNIQUET SINGLE 44IN (TOURNIQUET CUFF) IMPLANT
DRSG ADAPTIC 3X8 NADH LF (GAUZE/BANDAGES/DRESSINGS) ×2 IMPLANT
DRSG EMULSION OIL 3X3 NADH (GAUZE/BANDAGES/DRESSINGS) ×2 IMPLANT
DRSG PAD ABDOMINAL 8X10 ST (GAUZE/BANDAGES/DRESSINGS) ×2 IMPLANT
DURAPREP 26ML APPLICATOR (WOUND CARE) ×2 IMPLANT
ELECT REM PT RETURN 9FT ADLT (ELECTROSURGICAL)
ELECTRODE REM PT RTRN 9FT ADLT (ELECTROSURGICAL) IMPLANT
GAUZE XEROFORM 5X9 LF (GAUZE/BANDAGES/DRESSINGS) ×2 IMPLANT
GLOVE BIOGEL PI IND STRL 8 (GLOVE) ×1 IMPLANT
GLOVE BIOGEL PI INDICATOR 8 (GLOVE) ×1
GLOVE ECLIPSE 8.0 STRL XLNG CF (GLOVE) ×2 IMPLANT
GOWN STRL NON-REIN LRG LVL3 (GOWN DISPOSABLE) ×4 IMPLANT
HANDPIECE INTERPULSE COAX TIP (DISPOSABLE)
INSERT LCS 3 PEG STD (Knees) ×2 IMPLANT
INSERT TIB LCS RP STD+ 10 (Knees) ×2 IMPLANT
KIT BASIN OR (CUSTOM PROCEDURE TRAY) ×2 IMPLANT
KIT ROOM TURNOVER OR (KITS) ×2 IMPLANT
MANIFOLD NEPTUNE II (INSTRUMENTS) ×2 IMPLANT
NS IRRIG 1000ML POUR BTL (IV SOLUTION) ×2 IMPLANT
PACK ORTHO EXTREMITY (CUSTOM PROCEDURE TRAY) ×2 IMPLANT
PAD ARMBOARD 7.5X6 YLW CONV (MISCELLANEOUS) ×4 IMPLANT
PADDING CAST COTTON 6X4 STRL (CAST SUPPLIES) ×2 IMPLANT
PENCIL BUTTON HOLSTER BLD 10FT (ELECTRODE) IMPLANT
SET HNDPC FAN SPRY TIP SCT (DISPOSABLE) IMPLANT
SPONGE GAUZE 4X4 12PLY (GAUZE/BANDAGES/DRESSINGS) ×2 IMPLANT
SPONGE LAP 18X18 X RAY DECT (DISPOSABLE) ×2 IMPLANT
SPONGE LAP 4X18 X RAY DECT (DISPOSABLE) ×2 IMPLANT
STOCKINETTE IMPERVIOUS 9X36 MD (GAUZE/BANDAGES/DRESSINGS) ×2 IMPLANT
SUT ETHILON 3 0 PS 1 (SUTURE) IMPLANT
TOWEL OR 17X24 6PK STRL BLUE (TOWEL DISPOSABLE) ×2 IMPLANT
TOWEL OR 17X26 10 PK STRL BLUE (TOWEL DISPOSABLE) ×2 IMPLANT
TUBE ANAEROBIC SPECIMEN COL (MISCELLANEOUS) IMPLANT
TUBE CONNECTING 12X1/4 (SUCTIONS) ×2 IMPLANT
UNDERPAD 30X30 INCONTINENT (UNDERPADS AND DIAPERS) ×2 IMPLANT
WATER STERILE IRR 1000ML POUR (IV SOLUTION) ×2 IMPLANT
YANKAUER SUCT BULB TIP NO VENT (SUCTIONS) ×2 IMPLANT

## 2013-01-31 NOTE — Op Note (Signed)
PATIENT ID:      [name]  MRN:     [id number] DOB/AGE:    1966/02/18 / 47 y.o.       OPERATIVE REPORT    DATE OF PROCEDURE:  01/31/2013       PREOPERATIVE DIAGNOSIS:   Septic Right Total Knee Replacement                                                       Estimated body mass index is 51.41 kg/(m^2) as calculated from the following:   Height as of this encounter: 5' 4.17" (1.63 m).   Weight as of this encounter: 136.6 kg (301 lb 2.4 oz).     POSTOPERATIVE DIAGNOSIS:   Septic Right Total Knee Replacement                                                                     Estimated body mass index is 51.41 kg/(m^2) as calculated from the following:   Height as of this encounter: 5' 4.17" (1.63 m).   Weight as of this encounter: 136.6 kg (301 lb 2.4 oz).     PROCEDURE:  Procedure(s): IRRIGATION AND DEBRIDEMENT KNEE WITH POLY EXCHANGE possible Antibiotic Spacer right     SURGEON:  [name], MD    ASSISTANT:   [name], PA-C   (Present and scrubbed throughout the case, critical for assistance with exposure, retraction, instrumentation, and closure.)          ANESTHESIA: general     DRAINS: (right knee) Hemovact drain(s) in the open with  Suction Open :      TOURNIQUET TIME:  Total Tourniquet Time Documented: Thigh (Right) - 53 minutes Total: Thigh (Right) - 53 minutes     COMPLICATIONS:  None   CONDITION:  stable  PROCEDURE IN DETAIL: [id number]   [name] 01/31/2013, 8:21 PM

## 2013-01-31 NOTE — H&P (Signed)
    [facility]     Reason for Consult: Prosthetic joint infection    Referring Physician: Dr. Cleophas Dunker  Active Problems:   * No active hospital problems. *   . Chlorhexidine Gluconate Cloth  6 each Topical Daily  . DULoxetine  60 mg Oral Daily  . mupirocin ointment  1 application Nasal BID  . vancomycin  1,250 mg Intravenous Q12H    Recommendations: Continue vancomycin If no positive culture, will add ciprofloxacin   Assessment: She has a late prosthetic joint infection.  Gram stain from aspirate yesterday is no organism seen and culture negative to date.     Antibiotics: Vancomycin day 2  HPI: [name] is a 47 y.o. female with obesity and osteoarthritis with left TKR about 7 years ago and right TKR 2 years ago who was doing well until she developed acute pain.  She had knee effusion tapped in office and notable for WBCs and sent in for admission for OR.  To go to OR today, possible 1 or 2 stage revision.  No fever, no chills.  No history of PJI in past.  MRSA negative on screen.     Review of Systems: A comprehensive review of systems was negative.  Past Medical History  Diagnosis Date  . Pneumonia     hx 20+ years ago  . Headache     hormonal headache  . Arthritis   . Neuropathy     bilateral feet  . Ulcer of foot     toe ulcer greater than a year ago  . Obese     History  Substance Use Topics  . Smoking status: Never Smoker   . Smokeless tobacco: Never Used  . Alcohol Use: No    Family History  Problem Relation Age of Onset  . Anesthesia problems Father   . Hypotension Neg Hx   . Malignant hyperthermia Neg Hx   . Pseudochol deficiency Neg Hx    No Known Allergies  OBJECTIVE: Blood pressure 119/69, pulse 93, temperature 99.3 F (37.4 C), temperature source Oral, resp. rate 18, height 5' 4.17" (1.63 m), weight 301 lb 2.4 oz (136.6 kg), last menstrual period 01/23/2013, SpO2 96.00%. General: Awake, alert, in bed,  nad Skin: no rashes Lungs: CTA B Cor: RRR without m Abdomen: soft, nt, nd, +bs Ext: no edema  Microbiology: Recent Results (from the past 240 hour(s))  SURGICAL PCR SCREEN     Status: Abnormal   Collection Time    01/31/13  7:46 AM      Result Value Range Status   MRSA, PCR NEGATIVE  NEGATIVE Final   Staphylococcus aureus POSITIVE (*) NEGATIVE Final   Comment:            The Xpert SA Assay (FDA     approved for NASAL specimens     in patients over 59 years of age),     is one component of     a comprehensive surveillance     program.  Test performance has     been validated by [employer] for patients greater     than or equal to 8 year old.     It is not intended     to diagnose infection nor to     guide or monitor treatment.    [name] Righter, MD [email or web address] [phone] pager  [phone] 01/31/2013, 1:47 PM

## 2013-01-31 NOTE — Progress Notes (Signed)
Orthopedic Tech Progress Note Patient Details:  [name] 07/04/1965 [id number]  CPM Left Knee CPM Left Knee: On Left Knee Flexion (Degrees): 60 Left Knee Extension (Degrees): 0 Additional Comments: trapeze bar patient helper   [name] 01/31/2013, 9:49 PM

## 2013-01-31 NOTE — Transfer of Care (Signed)
Immediate Anesthesia Transfer of Care Note  Patient: [name]  Procedure(s) Performed: Procedure(s): IRRIGATION AND DEBRIDEMENT KNEE WITH POLY EXCHANGE possible Antibiotic Spacer (Right)  Patient Location: PACU  Anesthesia Type:General  Level of Consciousness: oriented, sedated, patient cooperative and responds to stimulation  Airway & Oxygen Therapy: Patient Spontanous Breathing and Patient connected to face mask oxygen  Post-op Assessment: Report given to PACU RN, Post -op Vital signs reviewed and stable and Patient moving all extremities X 4  Post vital signs: Reviewed and stable  Complications: No apparent anesthesia complications

## 2013-01-31 NOTE — H&P (Signed)
[name] is an 47 y.o. female.  Assessment:  47 year old female, S/P TKR (2 years ago for right knee, 7 years ago for left knee). Presented with right knee pain for the past 2 days. Is treated empirically on vancomycin.  Is currently doing well on pain medication. Knee infection likely.  Plan:  Continue dilaudid and oxycodone for knee pain. Continue vancomycin as empiric treatment for knee infection and wait for culture results.   HPI:  Presented to the outpatient clinic yesterday with the complaint of right knee pain for the past 2 days. She rated it a 10/10 for pain and was associated with significant weakness and difficulty walking. There was no erythema, discharge or swelling over the knee. She has no complaints of shortness of breath, abdominal pain, change in urine but says she had a significant loss of appetite along with chills. She attributes the sweats and hot flashes to perimenopause. No weight changes were noted. She is currently doing well on pain medication but says any movement causes pain. She denied any history of a change in drugs or food intake over the past couple of days.  Review of Systems  Constitutional: Positive for fever, chills and diaphoresis. Negative for weight loss and malaise/fatigue.  HENT: Negative for sore throat.   Respiratory: Negative for shortness of breath.   Cardiovascular: Negative for chest pain and leg swelling.  Musculoskeletal: Positive for joint pain.  Skin: Negative for rash.    Blood pressure 105/49, pulse 98, temperature 100.8 F (38.2 C), temperature source Oral, resp. rate 18, height 5' 4.17" (1.63 m), weight 136.6 kg (301 lb 2.4 oz), last menstrual period 01/23/2013, SpO2 100.00%. Physical Exam  Constitutional: She appears well-developed and well-nourished. No distress.  Cardiovascular: Normal rate, regular rhythm, normal heart sounds and intact distal pulses.   Respiratory: Effort normal and breath sounds normal. No stridor.  She has no wheezes.  GI: Soft. She exhibits no distension and no mass. There is no tenderness. There is no rebound and no guarding.  Musculoskeletal:  No tenderness, swelling or redness over knee joint  Lymphadenopathy:    She has no cervical adenopathy.  Skin: No rash noted. She is not diaphoretic. No erythema.       Otilio Carpen 01/31/2013, 11:37 AM

## 2013-01-31 NOTE — Consult Note (Signed)
PHARMACY CONSULT NOTE - follow up Pharmacy Consult for :   Vancomycin Indication:  Septic R-Total Knee Replacement  Allergies: No Known Allergies  Patient Measurements: Height: 5' 4.17" (163 cm) Dosing Weight: 301 lb 2.4 oz (136.6 kg) IBW/kg (Calculated) : 55.1  Vital Signs: BP 105/49  Pulse 98  Temp(Src) 100.8 F (38.2 C) (Oral)  Resp 18  Ht 5' 4.17" (1.63 m)  Wt 301 lb 2.4 oz (136.6 kg)  BMI 51.41 kg/m2  SpO2 100%  LMP 01/23/2013  Labs:  Recent Labs  01/30/13 1830 01/31/13 0425  WBC 8.9  --   HGB 10.4*  --   PLT 184  --   CREATININE 0.54 0.67   Estimated Creatinine Clearance: 120.4 ml/min (by C-G formula based on Cr of 0.67).   Microbiology: Recent Results (from the past 720 hour(s))  SURGICAL PCR SCREEN     Status: Abnormal   Collection Time    01/31/13  7:46 AM      Result Value Range Status   MRSA, PCR NEGATIVE  NEGATIVE Final   Staphylococcus aureus POSITIVE (*) NEGATIVE Final   Comment:            The Xpert SA Assay (FDA     approved for NASAL specimens     in patients over 64 years of age),     is one component of     a comprehensive surveillance     program.  Test performance has     been validated by [employer] for patients greater     than or equal to 75 year old.     It is not intended     to diagnose infection nor to     guide or monitor treatment.     Assessment: 47 y/o morbidly obese female admitted with Septic R-Total Knee Replacement to go to the OR today for right total knee replacement I&D, poly exchange and possible abx spacer.  Creat cl >60 ml/min. Wt 137 kg.   Goal of Therapy:   Vancomycin trough level 15-20 mcg/ml  Plan:   Vancomycin 2500 mg IV x 1 given 10/7 at 1942  Vancomycin 1250 mg IV q12 per vancomycin obesity dosing nomogram  Check steady-state trough as needed  Herby Abraham, Pharm.D. [phone] 01/31/2013 11:39 AM

## 2013-01-31 NOTE — Anesthesia Postprocedure Evaluation (Signed)
  Anesthesia Post-op Note  Patient: [name]  Procedure(s) Performed: Procedure(s): IRRIGATION AND DEBRIDEMENT KNEE WITH POLY EXCHANGE possible Antibiotic Spacer (Right)  Patient Location: PACU  Anesthesia Type:General  Level of Consciousness: awake and sedated  Airway and Oxygen Therapy: Patient Spontanous Breathing, Patient connected to nasal cannula oxygen and Patient connected to face mask oxygen  Post-op Pain: mild  Post-op Assessment: Post-op Vital signs reviewed  Post-op Vital Signs: stable  Complications: No apparent anesthesia complications

## 2013-01-31 NOTE — Progress Notes (Signed)
Orthopedic Tech Progress Note Patient Details:  [name] Clock 1966/02/02 [id number]  Patient ID: [name], female   DOB: 03-Jun-1965, 47 y.o.   MRN: [id number] Viewed order from doctor's order list  [name] 01/31/2013, 9:49 PM

## 2013-01-31 NOTE — Progress Notes (Signed)
Inpatient Diabetes Program Recommendations  AACE/ADA: New Consensus Statement on Inpatient Glycemic Control (2013)  Target Ranges:  Prepandial:   less than 140 mg/dL      Peak postprandial:   less than 180 mg/dL (1-2 hours)      Critically ill patients:  140 - 180 mg/dL   Reason for Visit: Results for [name], [name] (MRN [id number]) as of 01/31/2013 13:09  Ref. Range 01/31/2013 04:25  Hemoglobin A1C Latest Range: <5.7 % 7.8 (H)  Results for [name], [name] (MRN [id number]) as of 01/31/2013 13:09  Ref. Range 01/30/2013 18:30 01/31/2013 04:25  Glucose Latest Range: 70-99 mg/dL 782 (H) 956 (H)   Note A1C=7.8% indicating possible new onset diabetes?  MD please advise regarding possible diagnosis? While patient is in the hospital, please check CBG's tid with meals and HS.  Also please add moderate correction tid with meals.  Needs follow-up with PCP regarding elevated CBG's.  Will await MD confirmation prior to discussing with patient.  Beryl Meager, RN, BC-ADM Inpatient Diabetes Coordinator Pager [phone]

## 2013-01-31 NOTE — Anesthesia Preprocedure Evaluation (Addendum)
Anesthesia Evaluation  Patient identified by MRN, date of birth, ID band Patient awake    History of Anesthesia Complications Negative for: history of anesthetic complications  Airway Mallampati: II  Neck ROM: Full    Dental  (+) Teeth Intact and Caps Left back:   Pulmonary sleep apnea ,    + decreased breath sounds      Cardiovascular negative cardio ROS  Rhythm:Regular Rate:Normal     Neuro/Psych  Headaches, negative neurological ROS  negative psych ROS   GI/Hepatic negative GI ROS, Neg liver ROS, Controlled,  Endo/Other  neg diabetesMorbid obesityElevated glucose noted  Renal/GU negative Renal ROS  negative genitourinary   Musculoskeletal  (+) Arthritis -,   Abdominal (+) + obese,   Peds  Hematology  (+) Blood dyscrasia, anemia ,   Anesthesia Other Findings   Reproductive/Obstetrics negative OB ROS                          Anesthesia Physical Anesthesia Plan  ASA: III and emergent  Anesthesia Plan: General   Post-op Pain Management:    Induction: Intravenous  Airway Management Planned: Oral ETT  Additional Equipment:   Intra-op Plan:   Post-operative Plan: Extubation in OR  Informed Consent: I have reviewed the patients History and Physical, chart, labs and discussed the procedure including the risks, benefits and alternatives for the proposed anesthesia with the patient or authorized representative who has indicated his/her understanding and acceptance.   Dental Advisory Given  Plan Discussed with: CRNA, Surgeon and Anesthesiologist  Anesthesia Plan Comments:        Anesthesia Quick Evaluation

## 2013-02-01 ENCOUNTER — Encounter (HOSPITAL_COMMUNITY): Payer: Self-pay | Admitting: General Practice

## 2013-02-01 DIAGNOSIS — B951 Streptococcus, group B, as the cause of diseases classified elsewhere: Secondary | ICD-10-CM

## 2013-02-01 DIAGNOSIS — N390 Urinary tract infection, site not specified: Secondary | ICD-10-CM

## 2013-02-01 LAB — BASIC METABOLIC PANEL
BUN: 8 mg/dL (ref 6–23)
CO2: 27 mEq/L (ref 19–32)
Chloride: 99 mEq/L (ref 96–112)
Creatinine, Ser: 0.71 mg/dL (ref 0.50–1.10)
GFR calc Af Amer: >90 mL/min (ref >90)
Potassium: 3.5 mEq/L (ref 3.5–5.1)
Sodium: 135 mEq/L (ref 135–145)

## 2013-02-01 LAB — CBC
MCH: 29.1 pg (ref 26.0–34.0)
MCHC: 32.2 g/dL (ref 30.0–36.0)
MCV: 90.2 fL (ref 78.0–100.0)
Platelets: 190 10*3/uL (ref 150–400)
RDW: 14.8 % (ref 11.5–15.5)

## 2013-02-01 LAB — GLUCOSE, CAPILLARY
Glucose-Capillary: 211 mg/dL — ABNORMAL HIGH (ref 70–99)
Glucose-Capillary: 168 mg/dL — ABNORMAL HIGH (ref 70–99)
Glucose-Capillary: 201 mg/dL — ABNORMAL HIGH (ref 70–99)

## 2013-02-01 MED ORDER — CIPROFLOXACIN HCL 750 MG PO TABS
750.0000 mg | ORAL_TABLET | Freq: Two times a day (BID) | ORAL | Status: DC
  Administered 2013-02-01 (×2): 750 mg via ORAL
  Filled 2013-02-01 (×5): qty 1

## 2013-02-01 MED ORDER — SODIUM CHLORIDE 0.9 % IJ SOLN
10.0000 mL | INTRAMUSCULAR | Status: DC | PRN
  Administered 2013-02-02: 10 mL

## 2013-02-01 MED ORDER — LIVING WELL WITH DIABETES BOOK
Freq: Once | Status: DC
  Filled 2013-02-01: qty 1

## 2013-02-01 NOTE — Plan of Care (Signed)
Problem: Food- and Nutrition-Related Knowledge Deficit (NB-1.1) Goal: Nutrition education Formal process to instruct or train a patient/client in a skill or to impart knowledge to help patients/clients voluntarily manage or modify food choices and eating behavior to maintain or improve health. Outcome: Completed/Met Date Met:  02/01/13  RD consulted for nutrition education regarding diabetes.      Lab Results  Component Value Date    HGBA1C 7.8* 01/31/2013    RD provided "Carbohydrate Counting for People with Diabetes" handout from the Academy of Nutrition and Dietetics. Discussed different food groups and their effects on blood sugar, emphasizing carbohydrate-containing foods. Provided list of carbohydrates and recommended serving sizes of common foods.  Discussed importance of controlled and consistent carbohydrate intake throughout the day. Provided examples of ways to balance meals/snacks and encouraged intake of high-fiber, whole grain complex carbohydrates. Teach back method used.  Expect fair compliance. Pt states that she does not believe that she has diabetes and that the infection is why her blood sugar is elevated.  Encouraged pt to still follow the DM diet as it is an overall healthy diet that can promote weight loss.   Body mass index is 51.41 kg/(m^2). Pt meets criteria for Morbid Obesity based on current BMI.  Current diet order is CHO Modified. Labs and medications reviewed. No further nutrition interventions warranted at this time. RD contact information provided. If additional nutrition issues arise, please re-consult RD.  Kendell Bane RD, LDN, CNSC [phone] Pager [phone] After Hours Pager

## 2013-02-01 NOTE — Progress Notes (Signed)
Plan: Plan on 4-6 weeks of vancomycin. Add ciprofloxacin.  If culture is positive, alter treatment accordingly.   Assessment:  Post op synovectomy, debridement with polyexchange spacer on the right knee--day 1. Subjectively:  She is doing well. The pain in her right knee has decreased significantly and is rated as mild. She attributes feeling warm to her hot flashes but is otherwise well and has no complaints of chills. In addition, she is negative for shortness of breath, chest pain, abdominal discomfort, nausea.   Objectively:   BP  Temp  Temp src  Pulse  Resp  SpO2   02/01/13 0547  141/68 mmHg  98.7 F (37.1 C)  -  90  16  98 %   02/01/13 0400  -  -  -  -  18  98 %   02/01/13 0246  137/70 mmHg  99 F (37.2 C)  -  95  16  97 %   02/01/13 0000  -  -  -  -  17  99 %   01/31/13 2234  158/73 mmHg  99.9 F (37.7 C)  Oral  100  16  98 %   01/31/13 2215  140/77 mmHg  98.1 F (36.7 C)  -  100  12  100 %   01/31/13 2200  153/76 mmHg  -  -  104  17  95 %   01/31/13 2145  -  -  -  101  22  89 %   01/31/13 2130  148/80 mmHg  -  -  105  22  97 %   01/31/13 2115  153/89 mmHg  -  -  113  25  94 %   01/31/13 2114  153/89 mmHg  97.5 F (36.4 C)  -  114  24  96 %   01/31/13 1300  119/69 mmHg  99.3 F (37.4 C)  -  93  18  96 %    Intake/Output from previous day:  10/07 0701 - 10/08 0700  In: 3013.3 [P.O.:380; I.V.:2433.3]  Out: 1625 [Urine:1525; Drains:100]  Intake/Output this shift:  10/08 0701 - 10/08 1900  In: -  Out: 250 [Urine:250]  Intake/Output  10/07 0701 - 10/08 0700 10/08 0701 - 10/09 0700  P.O. 380  I.V. (mL/kg) 2433.3 (17.8)  IV Piggyback 200  Total Intake(mL/kg) 3013.3 (22.1)  Urine (mL/kg/hr) 1525 (0.5) 250 (2)  Drains 100 (0)  Total Output 1625 250  Net +1388.3 -250   LABORATORY DATA:   Recent Labs   01/30/13 1830   WBC  8.9   HGB  10.4*   HCT  31.1*   PLT  184     Recent Labs   01/30/13 1830  01/31/13 0425   NA  133*  135   K  3.5  3.9   CL  95*  96    CO2  23  30   BUN  8  6   CREATININE  0.54  0.67   GLUCOSE  249*  232*   CALCIUM  8.9  8.8    Lab Results   Component  Value  Date    INR  1.15  01/30/2013    INR  1.01  03/17/2012    INR  0.97  06/18/2011    Recent Radiographic Studies : Chest 2 View  01/31/2013 *RADIOLOGY REPORT* Clinical Data: Preoperative chest radiograph. CHEST - 2 VIEW Comparison: Chest radiograph performed 06/25/2011 Findings: The lungs are well-aerated and clear. There is no evidence  of focal opacification, pleural effusion or pneumothorax. The heart is borderline enlarged; there is perhaps slightly increased prominence of the right atrial border. No acute osseous abnormalities are seen. IMPRESSION: Borderline cardiomegaly; no acute cardiopulmonary process seen. Original Report Authenticated By: Tonia Ghent, M.D.  Examination:  General appearance: alert, cooperative and no distress  Wound Exam: clean, dry, intact  Drainage: None: wound tissue dry  Motor Exam: EHL, FHL, Anterior Tibial and Posterior Tibial Intact  Sensory Exam: history of neuropathy 5-6 yrs with altered sensation in both feet-unchanged diminished  Vascular Exam: Normal

## 2013-02-01 NOTE — Progress Notes (Signed)
Patient ID: [name], female   DOB: 08-22-1965, 47 y.o.   MRN: [id number] PATIENT ID: [name]        MRN:  [id number]          DOB/AGE: 1966/04/12 / 47 y.o.    [name], MD   [name], PA-C [address] [place], [place]  [address]                             [phone]   PROGRESS NOTE  Subjective:  negative for Chest Pain  negative for Shortness of Breath  negative for Nausea/Vomiting   negative for Calf Pain    Tolerating Diet: yes         Patient reports pain as mild.     Comfortable this am-does not have the pressure feeling in the right knee experienced pre op  Objective: Vital signs in last 24 hours:   Patient Vitals for the past 24 hrs:  BP Temp Temp src Pulse Resp SpO2  02/01/13 0547 141/68 mmHg 98.7 F (37.1 C) - 90 16 98 %  02/01/13 0400 - - - - 18 98 %  02/01/13 0246 137/70 mmHg 99 F (37.2 C) - 95 16 97 %  02/01/13 0000 - - - - 17 99 %  01/31/13 2234 158/73 mmHg 99.9 F (37.7 C) Oral 100 16 98 %  01/31/13 2215 140/77 mmHg 98.1 F (36.7 C) - 100 12 100 %  01/31/13 2200 153/76 mmHg - - 104 17 95 %  01/31/13 2145 - - - 101 22 89 %  01/31/13 2130 148/80 mmHg - - 105 22 97 %  01/31/13 2115 153/89 mmHg - - 113 25 94 %  01/31/13 2114 153/89 mmHg 97.5 F (36.4 C) - 114 24 96 %  01/31/13 1300 119/69 mmHg 99.3 F (37.4 C) - 93 18 96 %      Intake/Output from previous day:   10/07 0701 - 10/08 0700 In: 3013.3 [P.O.:380; I.V.:2433.3] Out: 1625 [Urine:1525; Drains:100]   Intake/Output this shift:   10/08 0701 - 10/08 1900 In: -  Out: 250 [Urine:250]   Intake/Output     10/07 0701 - 10/08 0700 10/08 0701 - 10/09 0700   P.O. 380    I.V. (mL/kg) 2433.3 (17.8)    IV Piggyback 200    Total Intake(mL/kg) 3013.3 (22.1)    Urine (mL/kg/hr) 1525 (0.5) 250 (2)   Drains 100 (0)    Total Output 1625 250   Net +1388.3 -250           LABORATORY DATA:  Recent Labs  01/30/13 1830  WBC 8.9  HGB 10.4*  HCT 31.1*  PLT 184     Recent Labs  01/30/13 1830 01/31/13 0425  NA 133* 135  K 3.5 3.9  CL 95* 96  CO2 23 30  BUN 8 6  CREATININE 0.54 0.67  GLUCOSE 249* 232*  CALCIUM 8.9 8.8   Lab Results  Component Value Date   INR 1.15 01/30/2013   INR 1.01 03/17/2012   INR 0.97 06/18/2011    Recent Radiographic Studies :  Chest 2 View  01/31/2013   *RADIOLOGY REPORT*  Clinical Data: Preoperative chest radiograph.  CHEST - 2 VIEW  Comparison: Chest radiograph performed 06/25/2011  Findings: The lungs are well-aerated and clear.  There is no evidence of focal opacification, pleural effusion or pneumothorax.  The heart is borderline enlarged; there is perhaps slightly increased  prominence of the right atrial border.  No acute osseous abnormalities are seen.  IMPRESSION: Borderline cardiomegaly; no acute cardiopulmonary process seen.   Original Report Authenticated By: Tonia Ghent, M.D.     Examination:  General appearance: alert, cooperative and no distress  Wound Exam: clean, dry, intact   Drainage:  None: wound tissue dry  Motor Exam: EHL, FHL, Anterior Tibial and Posterior Tibial Intact  Sensory Exam: history of neuropathy 5-6 yrs with altered sensation in both feet-unchanged diminished  Vascular Exam: Normal  Assessment:    1 Day Post-Op  Procedure(s) (LRB): IRRIGATION AND DEBRIDEMENT KNEE WITH POLY EXCHANGE possible Antibiotic Spacer (Right)  ADDITIONAL DIAGNOSIS:  Active Problems:   * No active hospital problems. *  Diabetes with elevated Ha1c   Plan: Physical Therapy as ordered Weight Bearing as Tolerated (WBAT)  DVT Prophylaxis:  Xarelto  DISCHARGE PLAN: Home  DISCHARGE NEEDS: HHPT and CPM  Await culture report-group B strep-ID to determine antibiotic coverage    [name] W 02/01/2013 7:55 AM

## 2013-02-01 NOTE — Evaluation (Signed)
Occupational Therapy Evaluation Patient Details Name: [name] MRN: [id number] DOB: [birth year] Today's Date: 02/01/2013 Time: 7829-5621 OT Time Calculation (min): 35 min  OT Assessment / Plan / Recommendation History of present illness Pt is a 47 y/o female admitted for septic R TKA, and is now s/p I&D with poly exchange.   Clinical Impression   Pt demos decline in function with ADLs and ADL mobility safety and would benefit from acute OT services to address impairments to help restore PLOF to return home safely    OT Assessment  Patient needs continued OT Services    Follow Up Recommendations  No OT follow up;Supervision/Assistance - 24 hour    Barriers to Discharge   None, pt will have 24 hour sup/A from family  Equipment Recommendations       Recommendations for Other Services    Frequency  Min 2X/week    Precautions / Restrictions Precautions Precautions: Knee;Fall Restrictions Weight Bearing Restrictions: Yes RLE Weight Bearing: Weight bearing as tolerated   Pertinent Vitals/Pain 2/10    ADL  Grooming: Performed;Wash/dry hands;Wash/dry face;Min guard Upper Body Bathing: Simulated;Set up;Supervision/safety Lower Body Bathing: Simulated;Moderate assistance Upper Body Dressing: Performed;Supervision/safety;Set up Lower Body Dressing: Performed;Maximal assistance Toilet Transfer: Performed;Minimal assistance Toilet Transfer Method: Sit to stand Toilet Transfer Equipment: Regular height toilet;Grab bars Toileting - Clothing Manipulation and Hygiene: Performed;Moderate assistance Where Assessed - Toileting Clothing Manipulation and Hygiene: Standing Tub/Shower Transfer Method: Not assessed Equipment Used: Long-handled sponge;Reacher;Rolling walker;Sock aid Transfers/Ambulation Related to ADLs: cues for correct hand placement. Pt declined to use 3 in 1 over toilet stating that she felt  "claustrophobic" usnig it ADL Comments: Pt provided wiht education and demo  of ADL A/E for use at home    OT Diagnosis: Acute pain  OT Problem List: Decreased knowledge of use of DME or AE;Decreased activity tolerance;Impaired balance (sitting and/or standing);Pain OT Treatment Interventions: Self-care/ADL training;Therapeutic exercise;Patient/family education;Neuromuscular education;Balance training;Therapeutic activities;DME and/or AE instruction   OT Goals(Current goals can be found in the care plan section) Acute Rehab OT Goals Patient Stated Goal: To return to PLOF OT Goal Formulation: With patient Time For Goal Achievement: 02/08/13 Potential to Achieve Goals: Good ADL Goals Pt Will Perform Grooming: with set-up;with supervision;standing Pt Will Perform Lower Body Bathing: with min assist;with caregiver independent in assisting;with adaptive equipment Pt Will Perform Lower Body Dressing: with max assist;with mod assist;with caregiver independent in assisting;with adaptive equipment Pt Will Transfer to Toilet: with min guard assist;with supervision;grab bars;regular height toilet Pt Will Perform Toileting - Clothing Manipulation and hygiene: with min assist;with min guard assist;sit to/from stand;sitting/lateral leans Pt Will Perform Tub/Shower Transfer: with min assist;with min guard assist;shower seat  Visit Information  Last OT Received On: 02/01/13 Assistance Needed: +1 History of Present Illness: Pt is a 47 y/o female admitted for septic R TKA, and is now s/p I&D with poly exchange.       Prior Functioning     Home Living Family/patient expects to be discharged to:: Private residence Living Arrangements: Spouse/significant other;Children Available Help at Discharge: Family;Available 24 hours/day Type of Home: House Home Access: Stairs to enter [employer] of Steps: 1 Entrance Stairs-Rails: None Home Layout: One level Home Equipment: Walker - 2 wheels;Shower seat - built in;Cane - single point Prior Function Level of Independence:  Independent Comments: Prior to saturday when pain started was independent with all activity, and after saturday needed assist and use of RW Communication Communication: No difficulties Dominant Hand: Right  Vision/Perception Vision - History Baseline Vision: Wears glasses for distance only Patient Visual Report: No change from baseline Perception Perception: Within Functional Limits   Cognition  Cognition Arousal/Alertness: Awake/alert Behavior During Therapy: WFL for tasks assessed/performed Overall Cognitive Status: Within Functional Limits for tasks assessed    Extremity/Trunk Assessment Upper Extremity Assessment Upper Extremity Assessment: Overall WFL for tasks assessed Lower Extremity Assessment Lower Extremity Assessment: Defer to PT evaluation Cervical / Trunk Assessment Cervical / Trunk Assessment: Normal     Mobility Bed Mobility Bed Mobility: Not assessed Details for Bed Mobility Assistance: Pt received up in recliner Transfers Sit to Stand: 4: Min guard;From chair/3-in-1;With armrests;From toilet Stand to Sit: 4: Min guard;To chair/3-in-1;With armrests;To toilet Details for Transfer Assistance: VC's for safety awareness, and pt demonstrated proper hand placement before initiating transfers.        Balance Balance Balance Assessed: Yes Dynamic Standing Balance Dynamic Standing - Balance Support: Left upper extremity supported;During functional activity Dynamic Standing - Level of Assistance: 5: Stand by Assist, min guard A   End of Session OT - End of Session Equipment Utilized During Treatment: Rolling walker Activity Tolerance: Patient tolerated treatment well Patient left: in chair;with call bell/phone within reach CPM Right Knee CPM Right Knee: Off  GO     [name] [facility] 02/01/2013, 12:41 PM

## 2013-02-01 NOTE — Evaluation (Signed)
Physical Therapy Evaluation Patient Details Name: [name] MRN: [id number] DOB: [birth year] Today's Date: 02/01/2013 Time: 4098-1191 PT Time Calculation (min): 24 min  PT Assessment / Plan / Recommendation History of Present Illness  Pt is a 47 y/o female admitted for septic R TKA, and is now s/p I&D with poly exchange.  Clinical Impression  Pt presents with acute pain and decreased independence with functional mobility due to septic R TKA. Pt reports improved symptoms since I&D, and states her pain is significantly decreased. At the time of PT eval, pt required min guard or supervision for all functional mobility. This patient would benefit from continued skilled PT services to address functional limitations, improve safety and independence with functional mobility, and return to PLOF.    PT Assessment  Patient needs continued PT services    Follow Up Recommendations  Home health PT    Does the patient have the potential to tolerate intense rehabilitation      Barriers to Discharge        Equipment Recommendations  None recommended by PT    Recommendations for Other Services     Frequency Min 5X/week    Precautions / Restrictions Precautions Precautions: Knee;Fall Restrictions Weight Bearing Restrictions: Yes RLE Weight Bearing: Weight bearing as tolerated   Pertinent Vitals/Pain Pt reports 0/10 pain at rest, and 4/10 pain during ambulation.      Mobility  Bed Mobility Bed Mobility: Not assessed Details for Bed Mobility Assistance: Pt received up in recliner Transfers Transfers: Sit to Stand;Stand to Sit Sit to Stand: 4: Min guard;From chair/3-in-1;With armrests Stand to Sit: 4: Min guard;To chair/3-in-1;With armrests Details for Transfer Assistance: VC's for safety awareness, and pt demonstrated proper hand placement before initiating transfers. Ambulation/Gait Ambulation/Gait Assistance: 5: Supervision;4: Min guard Ambulation Distance (Feet): 150  Feet Assistive device: Rolling walker Ambulation/Gait Assistance Details: VC's for increased heel strike, increased step length on Left to facilitate step-through gait pattern, and walker positioning close to pt's body. Gait Pattern: Step-to pattern;Step-through pattern;Decreased stride length Gait velocity: decreased Stairs: No    Exercises Total Joint Exercises Quad Sets: 10 reps Long Arc Quad: 5 reps   PT Diagnosis: Difficulty walking;Acute pain  PT Problem List: Decreased strength;Decreased range of motion;Decreased activity tolerance;Decreased balance;Decreased mobility;Decreased knowledge of use of DME;Decreased safety awareness;Decreased knowledge of precautions;Pain PT Treatment Interventions: Gait training;DME instruction;Stair training;Functional mobility training;Therapeutic activities;Therapeutic exercise;Neuromuscular re-education;Patient/family education     PT Goals(Current goals can be found in the care plan section) Acute Rehab PT Goals Patient Stated Goal: To return to PLOF PT Goal Formulation: With patient Time For Goal Achievement: 02/08/13 Potential to Achieve Goals: Good  Visit Information  Last PT Received On: 02/01/13 Assistance Needed: +1 History of Present Illness: Pt is a 47 y/o female admitted for septic R TKA, and is now s/p I&D with poly exchange.       Prior Functioning  Home Living Family/patient expects to be discharged to:: Private residence Living Arrangements: Spouse/significant other;Children;Parent Available Help at Discharge: Family;Available 24 hours/day Type of Home: House Home Access: Stairs to enter [employer] of Steps: 1 Entrance Stairs-Rails: None Home Layout: One level Home Equipment: Walker - 2 wheels;Shower seat - built in;Cane - single point Prior Function Level of Independence: Independent Comments: Prior to saturday when pain started was independent with all activity, and after saturday needed assist and use of  RW Communication Communication: No difficulties Dominant Hand: Right    Cognition  Cognition Arousal/Alertness: Awake/alert Behavior During Therapy: [facility] for tasks assessed/performed  Overall Cognitive Status: Within Functional Limits for tasks assessed    Extremity/Trunk Assessment Upper Extremity Assessment Upper Extremity Assessment: Overall WFL for tasks assessed Lower Extremity Assessment Lower Extremity Assessment: RLE deficits/detail RLE Deficits / Details: Decreased AROM and acute pain consistent with septic TKA Cervical / Trunk Assessment Cervical / Trunk Assessment: Normal   Balance Balance Balance Assessed: Yes Static Sitting Balance Static Sitting - Balance Support: Feet supported;Bilateral upper extremity supported Static Sitting - Level of Assistance: 6: Modified independent (Device/Increase time) Static Sitting - Comment/# of Minutes: 5 Static Standing Balance Static Standing - Balance Support: Bilateral upper extremity supported Static Standing - Level of Assistance: 5: Stand by assistance Static Standing - Comment/# of Minutes: 2  End of Session PT - End of Session Equipment Utilized During Treatment: Gait belt Activity Tolerance: Patient tolerated treatment well Patient left: in chair;with call bell/phone within reach Nurse Communication: Mobility status CPM Right Knee CPM Right Knee: Off Right Knee Flexion (Degrees): 65 Right Knee Extension (Degrees): 0 Additional Comments: completed 2 hours  GP     Ruthann Cancer 02/01/2013, 11:05 AM  Ruthann Cancer, PT, DPT

## 2013-02-01 NOTE — Op Note (Signed)
NAMEDESHANNON, [name]             ACCOUNT NO.:  [id number]  MEDICAL RECORD NO.:  [id number]  LOCATION:  [id number]                        FACILITY:  [facility]  PHYSICIAN:  Claude Manges. Zianne Schubring, M.D.DATE OF BIRTH:  Aug 10, 1965  DATE OF PROCEDURE:  01/31/2013 DATE OF DISCHARGE:                              OPERATIVE REPORT   PREOPERATIVE DIAGNOSIS:  Septic right total knee replacement.  POSTOPERATIVE DIAGNOSIS:  Septic right total knee replacement.  PROCEDURE:  Open synovectomy and debridement of right knee with polyethylene exchange.  SURGEON:  Claude Manges. Cleophas Dunker, MD  ASSISTANT:  Oris Drone. Petrarca, PA-C, was present throughout the operative procedure to ensure its timely completion.  ANESTHESIA:  General laryngeal without complications.  DESCRIPTION OF PROCEDURE:  [name] was met in the holding area, identified the right knee as appropriate operative site.  Knee was somewhat warm, it was distended.  She had prior aspiration, consistent with pus.  She had abundant white cells and at last check it was "growing" something on the culture, organism has not yet been determined.  The patient was then transported to room #7 and placed under general anesthesia without difficulty.  She had a Foley inserted on the floor.  A thigh tourniquet was applied to the right thigh.  The right leg was then prepped with chlorhexidine scrub and then DuraPrep from the tourniquet to the tips of the toes.  On scrubbing the toes, it was apparent that she had an ulcer on the plantar surface of the second toe. The prior abrasion along the medial aspect of her right calf had improved with the IV antibiotics, vancomycin.  Sterile draping was performed and the leg was elevated and Esmarch exsanguinated with a proximal tourniquet 250 mmHg.  The prior total knee and longitudinal incision was utilized and via sharp dissection around the subcutaneous tissue.  The patient's BMI was 51.  There was abundant adipose  tissue.  This was incised to the level of the deep capsule.  Deep capsule was identified by the previously inserted Ethibond sutures.  Along that, suture line incision was made. The joint was entered.  There was gross purulence.  This was sent for second culture and sensitivity.  The Ethibond sutures were removed. There was a moderate amount of synovitis, although it did not appear to be a chronic synovitis.  The patient has only been experiencing pain now for approximately 4 days.  There was not an extensive synovitis.  I did perform a synovectomy throughout the entire joint, thought I had an excellent debridement.  There were no loose bodies identified.  I then removed the polyethylene from the patellar component and amputated the polyethylene bridging bearing from its stem and then removed in 2 pieces.  I then used a lamina spreader, did debride any synovitis in the posterior aspect of the knee.  At that point, I copiously irrigated the joint with 6000 mL of saline solution.  I also irrigated the joint with Betadine diluted with saline and scrubbed the prosthesis to remove any bio film.  I then re-irrigated the joint.  At that point, I reinserted a new polyethylene bridging bearing and new polyethylene patellar component, attaching it to the previously inserted metal component.  The joint was again irrigated with saline solution for again total 6000 mL.  The tourniquet was then deflated at 53 minutes.  Bleeding was controlled with the Bovie.  I thought I had a nice dry field.  The medium-sized Hemovac was inserted to the lateral compartment.  The deep capsule was closed with #1 PDS, superficial capsule with a similar material, and then 2-0 PDS.  The skin was closed with skin clips. Sterile bulky dressing was applied followed by a the patient's compression stocking.  The patient tolerated the procedure without complications.     Claude Manges. Cleophas Dunker, M.D.     PWW/MEDQ  D:   01/31/2013  T:  02/01/2013  Job:  [id number]

## 2013-02-01 NOTE — Progress Notes (Addendum)
Inpatient Diabetes Program Recommendations  AACE/ADA: New Consensus Statement on Inpatient Glycemic Control (2013)  Target Ranges:  Prepandial:   less than 140 mg/dL      Peak postprandial:   less than 180 mg/dL (1-2 hours)      Critically ill patients:  140 - 180 mg/dL   Reason for Visit: Results for JACQUE, BYRON (MRN [id number]) as of 02/01/2013 16:07  Ref. Range 01/31/2013 21:16 02/01/2013 06:41 02/01/2013 10:58  Glucose-Capillary Latest Range: 70-99 mg/dL 409 (H) 811 (H) 914 (H)  Results for CLIO, GERHART (MRN [id number]) as of 02/01/2013 16:07  Ref. Range 03/17/2012 06:28 01/31/2013 04:25  Hemoglobin A1C Latest Range: <5.7 % 6.3 (H) 7.8 (H)   Spoke to patient regarding elevated A1C. She states that she has been tested for diabetes in the past but did not have diabetes.  A1c now elevated indicating that CBG's have been elevated over the past few months. Note that patient has infection which could contribute to elevated CBG's. Explained hospital blood sugar goals of 140-180 mg/dL and after discharge CBG's need to be 100-150 mg/dL. Left Rx. For MD to sign for glucose meter. Ordered "Living Well with Diabetes" booklet and Diabetes videos for patient.  She states that she discussed with Dr. Cleophas Dunker and plans to have A1C retested once infection has improved.   Likely needs to start outpatient diabetes medication.  May consider Metformin 500 mg bid (if no contraindications) at discharge.  Patient will need close follow-up with PCP and also outpatient diabetes education.  Patient states she plans to watch diabetes videos.  Will follow.  Beryl Meager, RN, BC-ADM Inpatient Diabetes Coordinator Pager [phone]

## 2013-02-01 NOTE — Progress Notes (Signed)
Peripherally Inserted Central Catheter/Midline Placement  The IV Nurse has discussed with the patient and/or persons authorized to consent for the patient, the purpose of this procedure and the potential benefits and risks involved with this procedure.  The benefits include less needle sticks, lab draws from the catheter and patient may be discharged home with the catheter.  Risks include, but not limited to, infection, bleeding, blood clot (thrombus formation), and puncture of an artery; nerve damage and irregular heat beat.  Alternatives to this procedure were also discussed.  PICC/Midline Placement Documentation        Timmothy Sours 02/01/2013, 6:16 PM

## 2013-02-01 NOTE — Progress Notes (Signed)
[facility]  Date of Admission:  01/30/2013  Antibiotics: Antibiotics Given (last 72 hours)   Date/Time Action Medication Dose Rate   01/30/13 1942 Given   vancomycin (VANCOCIN) 2,500 mg in sodium chloride 0.9 % 500 mL IVPB 2,500 mg 250 mL/hr   01/31/13 0743 Given   vancomycin (VANCOCIN) IVPB 1000 mg/200 mL premix 1,000 mg 200 mL/hr   01/31/13 2013 Given   vancomycin (VANCOCIN) 1,250 mg in sodium chloride 0.9 % 250 mL IVPB 1,250 mg    02/01/13 0557 Given   vancomycin (VANCOCIN) 1,250 mg in sodium chloride 0.9 % 250 mL IVPB 1,250 mg 166.7 mL/hr      Subjective: Less pain  Objective: Temp:  [97.5 F (36.4 C)-99.9 F (37.7 C)] 98.7 F (37.1 C) (10/08 0547) Pulse Rate:  [90-114] 90 (10/08 0547) Resp:  [12-25] 17 (10/08 0800) BP: (119-158)/(68-89) 141/68 mmHg (10/08 0547) SpO2:  [89 %-100 %] 98 % (10/08 0800)  General: Awake, seen in halls ambulating Skin: no rashes   Lab Results Lab Results  Component Value Date   WBC 6.0 02/01/2013   HGB 8.9* 02/01/2013   HCT 27.6* 02/01/2013   MCV 90.2 02/01/2013   PLT 190 02/01/2013    Lab Results  Component Value Date   CREATININE 0.71 02/01/2013   BUN 8 02/01/2013   NA 135 02/01/2013   K 3.5 02/01/2013   CL 99 02/01/2013   CO2 27 02/01/2013    Lab Results  Component Value Date   ALT 14 01/30/2013   AST 16 01/30/2013   ALKPHOS 83 01/30/2013   BILITOT 0.8 01/30/2013      Microbiology: Recent Results (from the past 240 hour(s))  URINE CULTURE     Status: None   Collection Time    01/30/13  7:30 PM      Result Value Range Status   Specimen Description URINE, CATHETERIZED   Final   Special Requests NONE   Final   Culture  Setup Time     Final   Value: 01/31/2013 01:07     Performed at [facility] Count     Final   Value: 40,000 COLONIES/ML     Performed at [facility]   Culture     Final   Value: GROUP B STREP(S.AGALACTIAE)ISOLATED     Note: TESTING AGAINST S.  AGALACTIAE NOT ROUTINELY PERFORMED DUE TO PREDICTABILITY OF AMP/PEN/VAN SUSCEPTIBILITY.     Performed at [facility]   Report Status 01/31/2013 FINAL   Final  SURGICAL PCR SCREEN     Status: Abnormal   Collection Time    01/31/13  7:46 AM      Result Value Range Status   MRSA, PCR NEGATIVE  NEGATIVE Final   Staphylococcus aureus POSITIVE (*) NEGATIVE Final   Comment:            The Xpert SA Assay (FDA     approved for NASAL specimens     in patients over 11 years of age),     is one component of     a comprehensive surveillance     program.  Test performance has     been validated by [employer] for patients greater     than or equal to 14 year old.     It is not intended     to diagnose infection nor to     guide or monitor treatment.  BODY FLUID  CULTURE     Status: None   Collection Time    01/31/13  7:16 PM      Result Value Range Status   Specimen Description SYNOVIAL FLUID KNEE RIGHT   Final   Special Requests PATIENT ON FOLLOWING VANCOMYCIN RECEIVED SWAB   Final   Gram Stain     Final   Value: RARE WBC PRESENT,BOTH PMN AND MONONUCLEAR     NO ORGANISMS SEEN     Performed at [facility]   Culture     Final   Value: NO GROWTH 1 DAY     Performed at [facility]   Report Status PENDING   Incomplete  ANAEROBIC CULTURE     Status: None   Collection Time    01/31/13  7:16 PM      Result Value Range Status   Specimen Description SYNOVIAL FLUID KNEE RIGHT   Final   Special Requests PATIENT ON FOLLOWING VANCOMYCIN RECEIVED SWAB   Final   Gram Stain     Final   Value: RARE WBC PRESENT,BOTH PMN AND MONONUCLEAR     NO ORGANISMS SEEN     Performed at [facility]   Culture PENDING   Incomplete   Report Status PENDING   Incomplete  BODY FLUID CULTURE     Status: None   Collection Time    01/31/13  7:17 PM      Result Value Range Status   Specimen Description SYNOVIAL FLUID KNEE RIGHT   Final   Special Requests PATIENT ON FOLLOWING  VANCOMYCIN SYRINGE   Final   Gram Stain     Final   Value: ABUNDANT WBC PRESENT,BOTH PMN AND MONONUCLEAR     NO ORGANISMS SEEN     Performed at [facility]     Final   Value: NO GROWTH 1 DAY     Performed at [facility]   Report Status PENDING   Incomplete    Studies/Results: Chest 2 View  01/31/2013   *RADIOLOGY REPORT*  Clinical Data: Preoperative chest radiograph.  CHEST - 2 VIEW  Comparison: Chest radiograph performed 06/25/2011  Findings: The lungs are well-aerated and clear.  There is no evidence of focal opacification, pleural effusion or pneumothorax.  The heart is borderline enlarged; there is perhaps slightly increased prominence of the right atrial border.  No acute osseous abnormalities are seen.  IMPRESSION: Borderline cardiomegaly; no acute cardiopulmonary process seen.   Original Report Authenticated By: Tonia Ghent, M.D.    Assessment/Plan: 1) [facility] - culture negative to date from office and from or, gram stain with nos.   -vancomycin and I will add cipro orally unless cultures come back with an organism -GBS on urine, unsure if this is representative.  Call out to [facility] but they are unable to tell me at this time if any growth on outpatient culture  Staci Righter, MD [facility] [email or web address] [phone] pager   [phone] cell 02/01/2013, 10:08 AM

## 2013-02-01 NOTE — OR Nursing (Signed)
Dilaudid 0.5 mg wasted, witnessed by [facility].

## 2013-02-01 NOTE — Progress Notes (Signed)
Patient selected [name] for home health PT. TNT provided CPM. [name] called with referral for home PT and plans for home IV antibiotics.

## 2013-02-02 DIAGNOSIS — E119 Type 2 diabetes mellitus without complications: Secondary | ICD-10-CM | POA: Diagnosis present

## 2013-02-02 DIAGNOSIS — E1169 Type 2 diabetes mellitus with other specified complication: Secondary | ICD-10-CM | POA: Diagnosis present

## 2013-02-02 LAB — CBC
Hemoglobin: 8.6 g/dL — ABNORMAL LOW (ref 12.0–15.0)
MCH: 28.6 pg (ref 26.0–34.0)
MCHC: 32.2 g/dL (ref 30.0–36.0)
Platelets: 218 10*3/uL (ref 150–400)
RBC: 3.01 MIL/uL — ABNORMAL LOW (ref 3.87–5.11)
RDW: 14.5 % (ref 11.5–15.5)

## 2013-02-02 LAB — BASIC METABOLIC PANEL
BUN: 6 mg/dL (ref 6–23)
Calcium: 8.6 mg/dL (ref 8.4–10.5)
GFR calc Af Amer: >90 mL/min (ref >90)
GFR calc non Af Amer: >90 mL/min (ref >90)
Glucose, Bld: 178 mg/dL — ABNORMAL HIGH (ref 70–99)
Sodium: 134 mEq/L — ABNORMAL LOW (ref 135–145)

## 2013-02-02 LAB — BODY FLUID CULTURE

## 2013-02-02 LAB — GLUCOSE, CAPILLARY

## 2013-02-02 MED ORDER — METHOCARBAMOL 500 MG PO TABS: 500.0000 mg | ORAL_TABLET | Freq: Four times a day (QID) | ORAL | Status: DC | PRN

## 2013-02-02 MED ORDER — HEPARIN SOD (PORK) LOCK FLUSH 100 UNIT/ML IV SOLN
250.0000 [IU] | INTRAVENOUS | Status: AC | PRN
  Administered 2013-02-02: 500 [IU]

## 2013-02-02 MED ORDER — OXYCODONE HCL 5 MG PO TABS: 5.0000 mg | ORAL_TABLET | ORAL | Status: DC | PRN

## 2013-02-02 MED ORDER — DEXTROSE 5 % IV SOLN
2.0000 g | INTRAVENOUS | Status: DC
  Administered 2013-02-02: 2 g via INTRAVENOUS
  Filled 2013-02-02 (×2): qty 2

## 2013-02-02 MED ORDER — DEXTROSE 5 % IV SOLN: 2.0000 g | INTRAVENOUS | Status: DC

## 2013-02-02 MED ORDER — RIVAROXABAN 10 MG PO TABS: 10.0000 mg | ORAL_TABLET | Freq: Every day | ORAL | Status: DC

## 2013-02-02 NOTE — Progress Notes (Signed)
Physical Therapy Treatment Patient Details Name: [name] MRN: [id number] DOB: 29-Sep-1965 Today's Date: 02/02/2013 Time: 1324-4010 PT Time Calculation (min): 22 min  PT Assessment / Plan / Recommendation  History of Present Illness Pt is a 47 y/o female admitted for septic R TKA, and is now s/p I&D with poly exchange.   PT Comments   Pt progressing towards physical therapy goals. Pt limited by fatigue today, as she reports she has hardly slept in the past 2 nights. She reports comfort with entering home (1 step) and refuses practice during therapy session, stating she was using her walker PTA and had opportunity to practice then.   Follow Up Recommendations  Home health PT     Does the patient have the potential to tolerate intense rehabilitation     Barriers to Discharge        Equipment Recommendations  None recommended by PT    Recommendations for Other Services    Frequency Min 5X/week   Progress towards PT Goals Progress towards PT goals: Progressing toward goals  Plan Current plan remains appropriate    Precautions / Restrictions Precautions Precautions: Knee;Fall Restrictions Weight Bearing Restrictions: Yes RLE Weight Bearing: Weight bearing as tolerated   Pertinent Vitals/Pain 6/10 pain in R knee    Mobility  Bed Mobility Bed Mobility: Not assessed Details for Bed Mobility Assistance: Pt received up in recliner Transfers Transfers: Sit to Stand;Stand to Sit Sit to Stand: 5: Supervision;From chair/3-in-1;With upper extremity assist Stand to Sit: 5: Supervision;To chair/3-in-1;With upper extremity assist Details for Transfer Assistance: Pt required increased time to come to full sit, as pt reports knee is stiff. Proper hand placement noted prior to initiating transfers. Ambulation/Gait Ambulation/Gait Assistance: 5: Supervision Ambulation Distance (Feet): 100 Feet Assistive device: Rolling walker Ambulation/Gait Assistance Details: VC's for increased  heel strike, and fluidity of movement with the walker.  Gait Pattern: Step-to pattern;Step-through pattern;Decreased stride length;Trunk flexed Gait velocity: decreased Stairs: No    Exercises Total Joint Exercises Heel Slides: Seated;10 reps Goniometric ROM: 82   PT Diagnosis:    PT Problem List:   PT Treatment Interventions:     PT Goals (current goals can now be found in the care plan section) Acute Rehab PT Goals Patient Stated Goal: To return to PLOF PT Goal Formulation: With patient Time For Goal Achievement: 02/08/13 Potential to Achieve Goals: Good  Visit Information  Last PT Received On: 02/02/13 Assistance Needed: +1 History of Present Illness: Pt is a 47 y/o female admitted for septic R TKA, and is now s/p I&D with poly exchange.    Subjective Data  Subjective: "I'm so tired, I haven't slept in 2 nights." Patient Stated Goal: To return to PLOF   Cognition  Cognition Arousal/Alertness: Awake/alert Behavior During Therapy: WFL for tasks assessed/performed Overall Cognitive Status: Within Functional Limits for tasks assessed    Balance  Balance Balance Assessed: Yes Static Standing Balance Static Standing - Balance Support: Bilateral upper extremity supported Static Standing - Level of Assistance: 5: Stand by assistance Static Standing - Comment/# of Minutes: 2  End of Session PT - End of Session Equipment Utilized During Treatment: Gait belt Activity Tolerance: Patient limited by fatigue Patient left: in chair;with call bell/phone within reach Nurse Communication: Mobility status   GP     Ruthann Cancer 02/02/2013, 10:21 AM  Ruthann Cancer, PT, DPT Acute Rehabilitation Services

## 2013-02-02 NOTE — Progress Notes (Signed)
Patient ID: [name], female   DOB: 09-23-1965, 47 y.o.   MRN: [id number] PATIENT ID: Quanasia Defino Coleman        MRN:  [id number]          DOB/AGE: 02-12-1966 / 47 y.o.    [name], MD   [name], PA-C [address] [place], [place]  [address]                             [phone]   PROGRESS NOTE  Subjective:  negative for Chest Pain  negative for Shortness of Breath  negative for Nausea/Vomiting   negative for Calf Pain    Tolerating Diet: yes         Patient reports pain as mild and moderate.     Much improved.  Pain controlled.  Objective: Vital signs in last 24 hours:   Patient Vitals for the past 24 hrs:  BP Temp Temp src Pulse Resp SpO2  02/02/13 0553 109/56 mmHg 99.4 F (37.4 C) Oral 96 18 99 %  02/01/13 2056 144/74 mmHg 98.7 F (37.1 C) Oral 107 18 98 %  02/01/13 1300 150/76 mmHg 99.8 F (37.7 C) - 99 16 97 %  02/01/13 0800 - - - - 17 98 %      Intake/Output from previous day:   10/08 0701 - 10/09 0700 In: 1243.8 [P.O.:400; I.V.:593.8] Out: 250 [Urine:250]   Intake/Output this shift:       Intake/Output     10/08 0701 - 10/09 0700 10/09 0701 - 10/10 0700   P.O. 400    I.V. (mL/kg) 593.8 (4.3)    IV Piggyback 250    Total Intake(mL/kg) 1243.8 (9.1)    Urine (mL/kg/hr) 250 (0.1)    Drains     Total Output 250     Net +993.8          Urine Occurrence 6 x       LABORATORY DATA:  Recent Labs  01/30/13 1830 02/01/13 0700 02/02/13 0550  WBC 8.9 6.0 7.2  HGB 10.4* 8.9* 8.6*  HCT 31.1* 27.6* 26.7*  PLT 184 190 218    Recent Labs  01/30/13 1830 01/31/13 0425 02/01/13 0700 02/02/13 0550  NA 133* 135 135 134*  K 3.5 3.9 3.5 3.3*  CL 95* 96 99 98  CO2 23 30 27 26   BUN 8 6 8 6   CREATININE 0.54 0.67 0.71 0.65  GLUCOSE 249* 232* 198* 178*  CALCIUM 8.9 8.8 8.4 8.6   Lab Results  Component Value Date   INR 1.15 01/30/2013   INR 1.01 03/17/2012   INR 0.97 06/18/2011    Recent Radiographic Studies :  Chest 2  View  01/31/2013   *RADIOLOGY REPORT*  Clinical Data: Preoperative chest radiograph.  CHEST - 2 VIEW  Comparison: Chest radiograph performed 06/25/2011  Findings: The lungs are well-aerated and clear.  There is no evidence of focal opacification, pleural effusion or pneumothorax.  The heart is borderline enlarged; there is perhaps slightly increased prominence of the right atrial border.  No acute osseous abnormalities are seen.  IMPRESSION: Borderline cardiomegaly; no acute cardiopulmonary process seen.   Original Report Authenticated By: Tonia Ghent, M.D.     Examination:  General appearance: alert, cooperative, mild distress and moderate distress  Wound Exam: clean, dry, intact   Drainage:  None: wound tissue dry  Motor Exam: EHL, FHL, Anterior Tibial and Posterior Tibial Intact  Sensory Exam:  Superficial Peroneal, Deep Peroneal and Tibial normal  Vascular Exam: Normal  Assessment:    2 Days Post-Op  Procedure(s) (LRB): IRRIGATION AND DEBRIDEMENT KNEE WITH POLY EXCHANGE possible Antibiotic Spacer (Right)  ADDITIONAL DIAGNOSIS:  Active Problems:   * No active hospital problems. *     Plan: Physical Therapy as ordered Weight Bearing as Tolerated (WBAT)  DVT Prophylaxis:  Xarelto  DISCHARGE PLAN: Home  DISCHARGE NEEDS: HHPT, HHRN, CPM, Walker, 3-in-1 comode seat and IV Antibiotics  Plan d/c on Rocephin 2 gms daily per Dr Luciana Axe         [facility] 02/02/2013 7:50 AM

## 2013-02-02 NOTE — Discharge Summary (Signed)
[name], MD   [name], PA-C [address], [place], [place]  [address]                             [phone]  PATIENT ID: [name]        MRN:  [id number]          DOB/AGE: [birth year] / 47 y.o.    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 Pneumonia     hx 20+ years ago  . Headache(784.0)     hormonal headache  . Arthritis   . Neuropathy     bilateral feet  . Ulcer of foot     toe ulcer greater than a year ago  . Obese     PROCEDURE: Procedure(s): IRRIGATION AND DEBRIDEMENT KNEE WITH POLY EXCHANGE possible Antibiotic Spacer Right on 01/30/2013 - 01/31/2013  CONSULTS: INFECTIOUS DISEASE     HISTORY: Patient is a 47 y.o. female presented with a history of pain in the right knee for 3 days. Onset of symptoms was abrupt starting Friday evening with rapidly worsening course since that time. Prior procedure on the knee was total knee arthroplasty in February 2012. Patient was seen in the office today with elevation of temperature to 101 degrees, loss of appetite, and significant pain in the right TKR. She does have an abrasion on her right calf from a saddle rubbing the leg. Aspiration Of the knee revealed yellow thick cloudy material sent for cell count and C&S. Admitted to hospital for IV antibiotics and I&D of the right TKR.    HOSPITAL COURSE:  [name] is a 47 y.o. admitted on 01/30/2013 and found to have a diagnosis of Septic Right Total Knee Replacement.  Placed on IV Vancomycin per pharmacy protocol. After appropriate laboratory studies were obtained  they were taken to the operating room on  01/31/2013 and underwent   Procedure(s): IRRIGATION AND DEBRIDEMENT KNEE WITH POLY EXCHANGE   Right.   They were given perioperative antibiotics:  Anti-infectives   Start     Dose/Rate Route Frequency Ordered Stop   02/02/13 0800  cefTRIAXone (ROCEPHIN) 2 g in dextrose 5 % 50 mL IVPB     2 g 100 mL/hr over 30 Minutes Intravenous Every 24 hours 02/02/13 0754     02/02/13 0000  dextrose 5 % SOLN 50 mL with cefTRIAXone 2 G SOLR 2 g     2 g 100 mL/hr over 30 Minutes Intravenous Every 24 hours 02/02/13 0806     02/01/13 1115  ciprofloxacin (CIPRO) tablet 750 mg  Status:  Discontinued     750 mg Oral 2 times daily 02/01/13 0945 02/02/13 0756   01/31/13 1800  vancomycin (VANCOCIN) 1,250 mg in sodium chloride 0.9 % 250 mL IVPB  Status:  Discontinued     1,250 mg 166.7 mL/hr over 90 Minutes Intravenous Every 12 hours 01/31/13 1136 02/02/13 0756   01/31/13 0800  vancomycin (VANCOCIN) IVPB 1000 mg/200 mL premix  Status:  Discontinued     1,000 mg 200 mL/hr over 60 Minutes Intravenous Every 12 hours 01/30/13 1842 01/31/13 1136   01/30/13 1900  vancomycin (VANCOCIN) 2,500 mg in sodium chloride 0.9 % 500 mL IVPB     2,500 mg 250 mL/hr over 120 Minutes Intravenous  Once 01/30/13 1841 01/30/13 2142    .  Tolerated the procedure well.  Placed with a foley preoperatively.     Toradol was given post op.  POD #1, allowed out of bed to a chair.  PT for ambulation and exercise program.  Foley D/C'd in morning.  IV saline locked.  O2 discontionued. Dr Luciana Axe placed on Cipro in addition to Vancomycin. Hemovac pulled.  PICC line placed.  POD #2, continued PT and ambulation.  Placed on IV Rocephin per Dr Luciana Axe from ID.  [name] Kitchen  The remainder of the hospital course was dedicated to ambulation and strengthening.   The patient was discharged on 2 Days Post-Op in  Stable condition.  Blood products given:none  DIAGNOSTIC STUDIES: Recent vital signs: Patient Vitals for the past 24 hrs:  BP Temp Temp src Pulse Resp SpO2  02/02/13 0553 109/56  mmHg 99.4 F (37.4 C) Oral 96 18 99 %  02/01/13 2056 144/74 mmHg 98.7 F (37.1 C) Oral 107 18 98 %  02/01/13 1300 150/76 mmHg 99.8 F (37.7 C) - 99 16 97 %       Recent laboratory studies:  Recent Labs  01/30/13 1830 02/01/13 0700 02/02/13 0550  WBC 8.9 6.0 7.2  HGB 10.4* 8.9* 8.6*  HCT 31.1* 27.6* 26.7*  PLT 184 190 218    Recent Labs  01/30/13 1830 01/31/13 0425 02/01/13 0700 02/02/13 0550  NA 133* 135 135 134*  K 3.5 3.9 3.5 3.3*  CL 95* 96 99 98  CO2 23 30 27 26   BUN 8 6 8 6   CREATININE 0.54 0.67 0.71 0.65  GLUCOSE 249* 232* 198* 178*  CALCIUM 8.9 8.8 8.4 8.6   Lab Results  Component Value Date   INR 1.15 01/30/2013   INR 1.01 03/17/2012   INR 0.97 06/18/2011     Recent Radiographic Studies :  Chest 2 View  01/31/2013   *RADIOLOGY REPORT*  Clinical Data: Preoperative chest radiograph.  CHEST - 2 VIEW  Comparison: Chest radiograph performed 06/25/2011  Findings: The lungs are well-aerated and clear.  There is no evidence of focal opacification, pleural effusion or pneumothorax.  The heart is borderline enlarged; there is perhaps slightly increased prominence of the right atrial border.  No acute osseous abnormalities are seen.  IMPRESSION: Borderline cardiomegaly; no acute cardiopulmonary process seen.   Original Report Authenticated By: Tonia Ghent, M.D.    DISCHARGE INSTRUCTIONS: Discharge Orders   Future Orders Complete By Expires   Call MD / Call 911  As directed    Comments:     If you experience chest pain or shortness of breath, CALL 911 and be transported to the hospital emergency room.  If you develope a fever above 101 F, pus (white drainage) or increased drainage or redness at the wound, or calf pain, call your surgeon's office.   Change dressing  As directed    Comments:     Change dressing on SATURDAY, then change the dressing daily with sterile 4 x 4 inch gauze dressing and apply TED hose.  You may clean the incision with alcohol prior to  redressing.   Constipation Prevention  As directed    Comments:     Drink plenty of fluids.  Prune juice and/or coffee may be helpful.  You may use a stool softener, such as Colace (over the counter) 100  mg twice a day.  Use MiraLax (over the counter) for constipation as needed but this may take several days to work.  Mag Citrate --OR-- Milk of Magnesia may also be used but follow directions on the label.   CPM  As directed    Comments:     Continuous passive motion machine (CPM):      Use the CPM from 0 to 60 for 6-8 hours per day.      You may increase by 5-10 per day.  You may break it up into 2 or 3 sessions per day.      Use CPM for 3-4 weeks or until you are told to stop.   Diet Carb Modified  As directed    Do not put a pillow under the knee. Place it under the heel.  As directed    Driving restrictions  As directed    Comments:     No driving for 6 weeks   Increase activity slowly as tolerated  As directed    Lifting restrictions  As directed    Comments:     No lifting for 6 weeks   Patient may shower  As directed    Comments:     You may shower over the brown dressing.  Once the dressing is removed you may shower without a dressing once there is no drainage.  Do not wash over the wound.  If drainage remains, cover wound with plastic wrap and then shower.   TED hose  As directed    Comments:     Use stockings (TED hose) for 2 weeks on operative leg(s).  You may remove them at night for sleeping.   Weight bearing as tolerated  As directed    Questions:     Laterality:     Extremity:        DISCHARGE MEDICATIONS:     Medication List    STOP taking these medications       etodolac 500 MG tablet  Commonly known as:  LODINE     HYDROcodone-acetaminophen 10-325 MG per tablet  Commonly known as:  NORCO      TAKE these medications       dextrose 5 % SOLN 50 mL with cefTRIAXone 2 G SOLR 2 g  Inject 2 g into the vein daily.     DULoxetine 60 MG capsule  Commonly  known as:  CYMBALTA  Take 60 mg by mouth daily.     methocarbamol 500 MG tablet  Commonly known as:  ROBAXIN  Take 1 tablet (500 mg total) by mouth every 6 (six) hours as needed.     multivitamin with minerals Tabs tablet  Take 1 tablet by mouth daily.     oxyCODONE 5 MG immediate release tablet  Commonly known as:  Oxy IR/ROXICODONE  Take 1-2 tablets (5-10 mg total) by mouth every 3 (three) hours as needed.     rivaroxaban 10 MG Tabs tablet  Commonly known as:  XARELTO  Take 1 tablet (10 mg total) by mouth daily with breakfast.        FOLLOW UP VISIT:       Follow-up Information   Follow up with Valeria Batman, MD On 02/13/2013.   Specialty:  Orthopedic Surgery   Contact information:   [address] [place] [address] [phone]       DISPOSITION:   Home  CONDITION:  Stable   Dariyah Garduno 02/02/2013, 8:07 AM

## 2013-02-02 NOTE — Progress Notes (Signed)
D/C instructions and scripts given. Pts husbands at bedside to take pt home.

## 2013-02-02 NOTE — Progress Notes (Addendum)
[facility]  Date of Admission:  01/30/2013  Antibiotics: Antibiotics Given (last 72 hours)   Date/Time Action Medication Dose Rate   01/30/13 1942 Given   vancomycin (VANCOCIN) 2,500 mg in sodium chloride 0.9 % 500 mL IVPB 2,500 mg 250 mL/hr   01/31/13 0743 Given   vancomycin (VANCOCIN) IVPB 1000 mg/200 mL premix 1,000 mg 200 mL/hr   01/31/13 2013 Given   vancomycin (VANCOCIN) 1,250 mg in sodium chloride 0.9 % 250 mL IVPB 1,250 mg    02/01/13 0557 Given   vancomycin (VANCOCIN) 1,250 mg in sodium chloride 0.9 % 250 mL IVPB 1,250 mg 166.7 mL/hr   02/01/13 1211 Given   ciprofloxacin (CIPRO) tablet 750 mg 750 mg    02/01/13 1700 Given   vancomycin (VANCOCIN) 1,250 mg in sodium chloride 0.9 % 250 mL IVPB 1,250 mg 166.7 mL/hr   02/01/13 1945 Given   ciprofloxacin (CIPRO) tablet 750 mg 750 mg    02/02/13 0540 Given   vancomycin (VANCOCIN) 1,250 mg in sodium chloride 0.9 % 250 mL IVPB 1,250 mg 166.7 mL/hr      Subjective: No acute events  Objective: Temp:  [98.7 F (37.1 C)-99.8 F (37.7 C)] 99.4 F (37.4 C) (10/09 0553) Pulse Rate:  [96-107] 96 (10/09 0553) Resp:  [16-18] 18 (10/09 0553) BP: (109-150)/(56-76) 109/56 mmHg (10/09 0553) SpO2:  [97 %-99 %] 99 % (10/09 0553)  General: Awake, seen in halls ambulating Skin: no rashes   Lab Results Lab Results  Component Value Date   WBC 7.2 02/02/2013   HGB 8.6* 02/02/2013   HCT 26.7* 02/02/2013   MCV 88.7 02/02/2013   PLT 218 02/02/2013    Lab Results  Component Value Date   CREATININE 0.65 02/02/2013   BUN 6 02/02/2013   NA 134* 02/02/2013   K 3.3* 02/02/2013   CL 98 02/02/2013   CO2 26 02/02/2013    Lab Results  Component Value Date   ALT 14 01/30/2013   AST 16 01/30/2013   ALKPHOS 83 01/30/2013   BILITOT 0.8 01/30/2013      Microbiology: Recent Results (from the past 240 hour(s))  URINE CULTURE     Status: None   Collection Time    01/30/13  7:30 PM      Result Value Range Status   Specimen Description URINE, CATHETERIZED   Final   Special Requests NONE   Final   Culture  Setup Time     Final   Value: 01/31/2013 01:07     Performed at [facility] Count     Final   Value: 40,000 COLONIES/ML     Performed at [facility]   Culture     Final   Value: GROUP B STREP(S.AGALACTIAE)ISOLATED     Note: TESTING AGAINST S. AGALACTIAE NOT ROUTINELY PERFORMED DUE TO PREDICTABILITY OF AMP/PEN/VAN SUSCEPTIBILITY.     Performed at [facility]   Report Status 01/31/2013 FINAL   Final  SURGICAL PCR SCREEN     Status: Abnormal   Collection Time    01/31/13  7:46 AM      Result Value Range Status   MRSA, PCR NEGATIVE  NEGATIVE Final   Staphylococcus aureus POSITIVE (*) NEGATIVE Final   Comment:            The Xpert SA Assay (FDA     approved for NASAL specimens     in patients over 75 years of age),  is one component of     a comprehensive surveillance     program.  Test performance has     been validated by [facility] for patients greater     than or equal to 54 year old.     It is not intended     to diagnose infection nor to     guide or monitor treatment.  BODY FLUID CULTURE     Status: None   Collection Time    01/31/13  7:16 PM      Result Value Range Status   Specimen Description SYNOVIAL FLUID KNEE RIGHT   Final   Special Requests PATIENT ON FOLLOWING VANCOMYCIN RECEIVED SWAB   Final   Gram Stain     Final   Value: RARE WBC PRESENT,BOTH PMN AND MONONUCLEAR     NO ORGANISMS SEEN     Performed at [facility]   Culture     Final   Value: NO GROWTH 1 DAY     Performed at [facility]   Report Status PENDING   Incomplete  ANAEROBIC CULTURE     Status: None   Collection Time    01/31/13  7:16 PM      Result Value Range Status   Specimen Description SYNOVIAL FLUID KNEE RIGHT   Final   Special Requests PATIENT ON FOLLOWING VANCOMYCIN RECEIVED SWAB   Final   Gram Stain     Final   Value: RARE WBC  PRESENT,BOTH PMN AND MONONUCLEAR     NO ORGANISMS SEEN     Performed at [facility]     Final   Value: NO ANAEROBES ISOLATED; CULTURE IN PROGRESS FOR 5 DAYS     Performed at [facility]   Report Status PENDING   Incomplete  ANAEROBIC CULTURE     Status: None   Collection Time    01/31/13  7:17 PM      Result Value Range Status   Specimen Description SYNOVIAL FLUID KNEE RIGHT   Final   Special Requests PATIENT ON FOLLOWING VANCOMYCIN SYRINGE   Final   Gram Stain PENDING   Incomplete   Culture     Final   Value: NO ANAEROBES ISOLATED; CULTURE IN PROGRESS FOR 5 DAYS     Performed at [facility]   Report Status PENDING   Incomplete  BODY FLUID CULTURE     Status: None   Collection Time    01/31/13  7:17 PM      Result Value Range Status   Specimen Description SYNOVIAL FLUID KNEE RIGHT   Final   Special Requests PATIENT ON FOLLOWING VANCOMYCIN SYRINGE   Final   Gram Stain     Final   Value: ABUNDANT WBC PRESENT,BOTH PMN AND MONONUCLEAR     NO ORGANISMS SEEN     Performed at [facility]     Final   Value: NO GROWTH 1 DAY     Performed at [facility]   Report Status PENDING   Incomplete    Studies/Results: No results found.  Assessment/Plan: 1) PJI, late, s/p polyexhcange- culture reported as Group B Strep in knee from outside culture in ortho office.   -she has been changed to Rocephin 2 grams daily for a proposed 6 weeks (through Nov 18th) -weekly cbc, cmp and antibioitcs per home health protocol and resutls to [facility] -we will follow up with her in  2 weeks  Thanks for the consult   Lijah Bourque, Molly Maduro, MD [email or web address] [phone] pager   [phone] cell 02/02/2013, 8:27 AM

## 2013-02-03 ENCOUNTER — Encounter (HOSPITAL_COMMUNITY): Payer: Self-pay | Admitting: Orthopaedic Surgery

## 2013-02-05 LAB — ANAEROBIC CULTURE

## 2013-02-15 ENCOUNTER — Encounter: Payer: Self-pay | Admitting: Infectious Diseases

## 2013-02-15 ENCOUNTER — Ambulatory Visit (INDEPENDENT_AMBULATORY_CARE_PROVIDER_SITE_OTHER): Payer: BC Managed Care – PPO | Admitting: Infectious Diseases

## 2013-02-15 VITALS — BP 125/82 | HR 105 | Temp 98.1°F | Wt 286.0 lb

## 2013-02-15 DIAGNOSIS — E119 Type 2 diabetes mellitus without complications: Secondary | ICD-10-CM

## 2013-02-15 DIAGNOSIS — T8459XS Infection and inflammatory reaction due to other internal joint prosthesis, sequela: Secondary | ICD-10-CM

## 2013-02-15 DIAGNOSIS — E1169 Type 2 diabetes mellitus with other specified complication: Secondary | ICD-10-CM

## 2013-02-15 DIAGNOSIS — E669 Obesity, unspecified: Secondary | ICD-10-CM

## 2013-02-15 DIAGNOSIS — T889XXS Complication of surgical and medical care, unspecified, sequela: Secondary | ICD-10-CM

## 2013-02-15 LAB — COMPREHENSIVE METABOLIC PANEL
ALT: 10 U/L (ref 0–35)
AST: 17 U/L (ref 0–37)
Albumin: 3.6 g/dL (ref 3.5–5.2)
Alkaline Phosphatase: 73 U/L (ref 39–117)
BUN: 13 mg/dL (ref 6–23)
CO2: 30 mEq/L (ref 19–32)
Calcium: 9.5 mg/dL (ref 8.4–10.5)
Chloride: 96 mEq/L (ref 96–112)
Creat: 0.68 mg/dL (ref 0.50–1.10)
Potassium: 4 mEq/L (ref 3.5–5.3)
Total Bilirubin: 0.5 mg/dL (ref 0.3–1.2)

## 2013-02-15 LAB — C-REACTIVE PROTEIN: CRP: 6.3 mg/dL — ABNORMAL HIGH (ref <0.60)

## 2013-02-15 LAB — SEDIMENTATION RATE: Sed Rate: 79 mm/hr — ABNORMAL HIGH (ref 0–22)

## 2013-02-15 NOTE — Addendum Note (Signed)
Addended by: Jennet Maduro D on: 02/15/2013 05:58 PM   Modules accepted: Orders

## 2013-02-15 NOTE — Progress Notes (Signed)
  Subjective:    Patient ID: [name], female    DOB: 02-05-1966, 47 y.o.   MRN: [id number]  HPI 47 year old female with a history of type 2 diabetes (newly dx?), obesity, left total knee replacement 2007, right total knee replacement 2012. She came to [facility] on October 6 with 2 days of right knee pain, fever, sweats, and hot flashes. In the emergency room her temperature was 100.8. She had been seen previously in her orthopedist office and had an arthrocentesis. In hospital she was started on vancomycin. On October 7, she underwent irrigation and debridement of her right total knee replacement with exchange of parts. Cipro was added to her antibiotic regimen on October 8. She was found to have group B strep in her culture from her outpatient aspiration. On October 9 her antibodies were changed to ceftriaxone 2 g once daily and she was discharged home. 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 CRP 31.7. ESR 113. No problems with anbx, no problems with PIC. Knee has bee painful today. Has been wt bearing as tolerated, has been getting PT, using a walker and a cane.  Has "had mild fever this whole time (100.1-100.4)".  Anbx completion date:  03/14/2013  Review of Systems  Constitutional: Positive for appetite change and unexpected weight change.  Eyes: Negative for visual disturbance.  Respiratory: Negative for cough and shortness of breath.   Gastrointestinal: Negative for diarrhea and constipation.  Genitourinary: Negative for difficulty urinating.   Appetite down, has lost 15-17#.     Objective:   Physical Exam  Constitutional: She appears well-developed and well-nourished.  HENT:  Mouth/Throat: No oropharyngeal exudate.  Eyes: EOM are normal. Pupils are equal, round, and reactive to light.  Neck: Neck supple.  Cardiovascular: Normal rate, regular rhythm and normal heart sounds.   Pulmonary/Chest: Effort normal and breath sounds normal.    Abdominal: Soft. Bowel sounds are normal. There is no tenderness. There is no rebound.  Musculoskeletal:       Arms:      Legs: Lymphadenopathy:    She has no cervical adenopathy.          Assessment & Plan:

## 2013-02-15 NOTE — Assessment & Plan Note (Addendum)
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 Would query whether or not this was due to a concurrent urinary tract infection. Also, consider adding rifampin in the presence of prosthetic in her knee, however for now she appears to be doing well and will continue on ceftriaxone alone. We'll recheck her CBC, CMP, sedimentation rate and C-reactive protein today. She has questions as to how we'll know that the infection has been cured. I explained her the best way we have to follow this is to check surrogate markers (such as CRP and ESR). She states she has followed up with Dr. Cleophas Dunker and has been cleared by him. We will see her back on November 18 for assessment as to whether or not she will need prolonged oral antibiotics following her IV antibiotics. We'll reassess her CRP and ESR at that time

## 2013-02-15 NOTE — Progress Notes (Signed)
Received verbal order from Dr Ninetta Lights to change PICC dressing.  Sterile dressing change to right brachial PICC site.  Statlock replaced.  PICC extension changed and flushed with 10 cc sterile saline and 5 cc sterile heparinized saline.  Pt tolerated procedure without complaint.  (total 20 minutes time)

## 2013-02-15 NOTE — Assessment & Plan Note (Signed)
She will follow up with her PCP with regards this diagnosis. She states that she had normal hemoglobin A1c as an outpatient. She had markedly abnormal glucoses in the hospital (in the 200s). Controlling FSG will be key to controlling her infection her knee.

## 2013-02-16 LAB — CBC WITH DIFFERENTIAL/PLATELET
Basophils Absolute: 0.1 10*3/uL (ref 0.0–0.1)
Eosinophils Absolute: 0.2 10*3/uL (ref 0.0–0.7)
Eosinophils Relative: 2 % (ref 0–5)
HCT: 30.5 % — ABNORMAL LOW (ref 36.0–46.0)
Lymphocytes Relative: 20 % (ref 12–46)
Lymphs Abs: 1.8 10*3/uL (ref 0.7–4.0)
MCV: 82.9 fL (ref 78.0–100.0)
Monocytes Absolute: 0.6 10*3/uL (ref 0.1–1.0)
Neutro Abs: 6.1 10*3/uL (ref 1.7–7.7)
Neutrophils Relative %: 70 % (ref 43–77)
RBC: 3.68 MIL/uL — ABNORMAL LOW (ref 3.87–5.11)
RDW: 15.2 % (ref 11.5–15.5)
WBC: 8.7 10*3/uL (ref 4.0–10.5)

## 2013-03-14 ENCOUNTER — Ambulatory Visit (INDEPENDENT_AMBULATORY_CARE_PROVIDER_SITE_OTHER): Payer: BC Managed Care – PPO | Admitting: Infectious Diseases

## 2013-03-14 ENCOUNTER — Encounter: Payer: Self-pay | Admitting: Infectious Diseases

## 2013-03-14 VITALS — BP 148/82 | HR 106 | Temp 98.4°F | Wt 278.0 lb

## 2013-03-14 DIAGNOSIS — T8459XD Infection and inflammatory reaction due to other internal joint prosthesis, subsequent encounter: Secondary | ICD-10-CM

## 2013-03-14 DIAGNOSIS — Z5189 Encounter for other specified aftercare: Secondary | ICD-10-CM

## 2013-03-14 DIAGNOSIS — E1169 Type 2 diabetes mellitus with other specified complication: Secondary | ICD-10-CM

## 2013-03-14 DIAGNOSIS — E669 Obesity, unspecified: Secondary | ICD-10-CM

## 2013-03-14 DIAGNOSIS — E119 Type 2 diabetes mellitus without complications: Secondary | ICD-10-CM

## 2013-03-14 LAB — C-REACTIVE PROTEIN: CRP: 1.9 mg/dL — ABNORMAL HIGH (ref <0.60)

## 2013-03-14 MED ORDER — LEVOFLOXACIN 500 MG PO TABS: 500.0000 mg | ORAL_TABLET | Freq: Every day | ORAL | Status: DC

## 2013-03-14 NOTE — Progress Notes (Signed)
RN received verbal order to discontinue the patient's PICC line.  Patient identified with name and date of birth. PICC dressing removed, site unremarkable.    PICC line removed using sterile procedure @ 1440. PICC length equal to that noted in patient's hospital chart of 43 cm. Sterile petroleum gauze + sterile 4X4 applied to PICC site, pressure applied for 10 minutes and covered with Medipore tape as a pressure dressing. Patient tolerated procedure without complaints.  Patient instructed to limit use of arm for 1 hour. Patient instructed that the pressure dressing should remain in place for 24 hours. Patient verbalized understanding of these instructions.

## 2013-03-14 NOTE — Progress Notes (Signed)
  Subjective:    Patient ID: [name], female    DOB: 04-10-1966, 47 y.o.   MRN: [id number]  HPI 47 year old female with a history of type 2 diabetes (newly dx?), obesity, left total knee replacement 2007, right total knee replacement 2012. She came to [facility] on October 6 with 2 days of right knee pain, fever, sweats, and hot flashes. In the emergency room her temperature was 100.8. She had been seen previously in her orthopedist office and had an arthrocentesis. In hospital she was started on vancomycin. On October 7, she underwent irrigation and debridement of her right total knee replacement with exchange of parts. Cipro was added to her antibiotic regimen on October 8. She was found to have group B strep in her culture from her outpatient aspiration. On October 9 her antibodies were changed to ceftriaxone 2 g once daily and she was discharged home. 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 CRP 31.7. ESR 113. She was seen in ID f/u on 02-15-13 and was doing well. Repeat ESR 79 and CRP 6.3.  Today is her last day of anbx. Just started outpt PT.  Has not been checking FSG, will have HgBA1C rechecked after she completes her infectious w/u.   Review of Systems  Constitutional: Positive for appetite change. Negative for fever, chills and unexpected weight change.  Gastrointestinal: Negative for nausea and diarrhea.  Genitourinary: Negative for difficulty urinating.       Objective:   Physical Exam  Constitutional: She appears well-developed and well-nourished.  Musculoskeletal:       Arms:      Legs:         Assessment & Plan:

## 2013-03-14 NOTE — Assessment & Plan Note (Signed)
She is convinced that she does not have this. She will follow this up after her infectious issues have resolved. I am less certain about this, her last A1C was > 7. She has lost 40# though and this may help her turn the corner.

## 2013-03-14 NOTE — Addendum Note (Signed)
Addended by: Lurlean Leyden on: 03/14/2013 05:14 PM   Modules accepted: Orders

## 2013-03-14 NOTE — Assessment & Plan Note (Signed)
Her continued elevation of ESR and CRP is concerning. Will pull her PIC. Will change her to levaquin. Will recheck her ESR and CRP. Will plan for her to continue on oral anbx for at least 6-12 months. . Will see her back in 3 months.

## 2013-06-13 ENCOUNTER — Encounter: Payer: Self-pay | Admitting: Infectious Diseases

## 2013-06-13 ENCOUNTER — Ambulatory Visit (INDEPENDENT_AMBULATORY_CARE_PROVIDER_SITE_OTHER): Payer: BC Managed Care – PPO | Admitting: Infectious Diseases

## 2013-06-13 VITALS — BP 142/75 | HR 72 | Temp 99.3°F | Wt 275.5 lb

## 2013-06-13 DIAGNOSIS — T8459XA Infection and inflammatory reaction due to other internal joint prosthesis, initial encounter: Secondary | ICD-10-CM

## 2013-06-13 DIAGNOSIS — E119 Type 2 diabetes mellitus without complications: Secondary | ICD-10-CM

## 2013-06-13 DIAGNOSIS — T8450XA Infection and inflammatory reaction due to unspecified internal joint prosthesis, initial encounter: Secondary | ICD-10-CM

## 2013-06-13 DIAGNOSIS — E669 Obesity, unspecified: Secondary | ICD-10-CM

## 2013-06-13 DIAGNOSIS — Z96659 Presence of unspecified artificial knee joint: Secondary | ICD-10-CM

## 2013-06-13 DIAGNOSIS — E1169 Type 2 diabetes mellitus with other specified complication: Secondary | ICD-10-CM

## 2013-06-13 LAB — C-REACTIVE PROTEIN: CRP: 1.7 mg/dL — ABNORMAL HIGH (ref <0.60)

## 2013-06-13 NOTE — Assessment & Plan Note (Signed)
Greatly appreciate FP follow up. Will forward this note, labs to him (Dr Derek JackWC Cyndia BentBadger, [facility]

## 2013-06-13 NOTE — Assessment & Plan Note (Addendum)
Will continue her levaquin. Will recheck her ESR and CRP today. She is at 4 months of therapy. Will aim for at least 6 based on what her repeat ESR and CRP look like.

## 2013-06-13 NOTE — Progress Notes (Signed)
   Subjective:    Patient ID: [name], female    DOB: 02/23/1966, 48 y.o.   MRN: [id number]  HPI 48 year old female with a history of type 2 diabetes (newly dx?), obesity, left total knee replacement 2007, right total knee replacement 2012. She came to [facility] on October 6 with 2 days of right knee pain, fever, sweats, and hot flashes. In the emergency room her temperature was 100.8. She had been seen previously in her orthopedist office and had an arthrocentesis. In hospital she was started on vancomycin. On October 7, she underwent irrigation and debridement of her right total knee replacement with exchange of parts. Cipro was added to her antibiotic regimen on October 8. She was found to have group B strep in her culture from her outpatient aspiration. On October 9 her antibodies were changed to ceftriaxone 2 g once daily and she was discharged home. 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 CRP 31.7. ESR 113.  She was seen in ID f/u on 02-15-13 and was doing well. Repeat ESR 79 and CRP 6.3. ESR 34 and CRP 1.9 (03-14-13).  She was seen in ID f/u on 03-14-13 and her IV anbx were stopped, PIC pulled and she was started on levaquin.  Has been back to ortho- has residual pain but no swelling, no heat. Still taking levaqiun. Has seen her FP in January and had repeat HgBA1C (high end of normal, no need for medication, will get retested [place]).   Review of Systems  Constitutional: Negative for fever, chills and unexpected weight change.  Gastrointestinal: Negative for diarrhea and constipation.  Genitourinary: Negative for vaginal discharge and difficulty urinating.  Neurological: Positive for numbness. Negative for headaches.  eating a lot of yogurt.      Objective:   Physical Exam  Constitutional: She appears well-developed and well-nourished.  Musculoskeletal:       Legs:         Assessment & Plan:

## 2013-06-14 ENCOUNTER — Telehealth: Payer: Self-pay | Admitting: Infectious Diseases

## 2013-06-14 LAB — SEDIMENTATION RATE: Sed Rate: 23 mm/hr — ABNORMAL HIGH (ref 0–22)

## 2013-06-14 NOTE — Telephone Encounter (Signed)
Results of ESR and CRP noted, called pt. Will see her in 6 months. Continue on same anbx.

## 2013-06-15 ENCOUNTER — Ambulatory Visit: Payer: BC Managed Care – PPO | Admitting: Infectious Diseases

## 2013-07-12 ENCOUNTER — Telehealth: Payer: Self-pay | Admitting: Licensed Clinical Social Worker

## 2013-07-12 NOTE — Telephone Encounter (Signed)
Patient called stating that she was having itching all over her face and head after being outside in the sun all day. She wanted to know is there restrictions on being in the sun on this medication? If so should she be on something else? This was left on voicemail, patient did not answer when I called to get more information. Please advise.

## 2013-07-13 NOTE — Telephone Encounter (Signed)
This medication can cause photosensitivity. She needs to wear sunscreen, a hat ect

## 2013-07-13 NOTE — Telephone Encounter (Signed)
Ok I advised the patient and she expressed understanding

## 2013-08-14 ENCOUNTER — Ambulatory Visit (INDEPENDENT_AMBULATORY_CARE_PROVIDER_SITE_OTHER): Payer: BC Managed Care – PPO | Admitting: Infectious Diseases

## 2013-08-14 VITALS — BP 116/76 | HR 73 | Temp 98.8°F | Wt 272.0 lb

## 2013-08-14 DIAGNOSIS — T8459XA Infection and inflammatory reaction due to other internal joint prosthesis, initial encounter: Secondary | ICD-10-CM

## 2013-08-14 DIAGNOSIS — Z96659 Presence of unspecified artificial knee joint: Secondary | ICD-10-CM

## 2013-08-14 DIAGNOSIS — T8450XA Infection and inflammatory reaction due to unspecified internal joint prosthesis, initial encounter: Secondary | ICD-10-CM

## 2013-08-14 MED ORDER — LEVOFLOXACIN 500 MG PO TABS: 500.0000 mg | ORAL_TABLET | Freq: Every day | ORAL | Status: DC

## 2013-08-14 NOTE — Assessment & Plan Note (Signed)
She has started phentiramine today. She will restart water aerobics. I encouraged her with regard to her wt loss. She is hopeful that this will help her with her joint pain (as am I).

## 2013-08-14 NOTE — Assessment & Plan Note (Addendum)
She appears much improved. Her wt bearing and function have improved. Will continue her anx at this point for 3 more months. Recheck her ESR and CRP at her return. She has had some issues with sun-burning while on anbx, advised to her to keep covered, wear sunscreen.
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
## 2013-11-13 ENCOUNTER — Encounter: Payer: Self-pay | Admitting: Infectious Diseases

## 2013-11-13 ENCOUNTER — Ambulatory Visit (INDEPENDENT_AMBULATORY_CARE_PROVIDER_SITE_OTHER): Payer: BC Managed Care – PPO | Admitting: Infectious Diseases

## 2013-11-13 VITALS — BP 118/79 | HR 69 | Temp 99.0°F | Ht 64.0 in | Wt 277.0 lb

## 2013-11-13 DIAGNOSIS — T889XXS Complication of surgical and medical care, unspecified, sequela: Secondary | ICD-10-CM

## 2013-11-13 DIAGNOSIS — Z96659 Presence of unspecified artificial knee joint: Principal | ICD-10-CM

## 2013-11-13 DIAGNOSIS — T8459XS Infection and inflammatory reaction due to other internal joint prosthesis, sequela: Secondary | ICD-10-CM

## 2013-11-13 NOTE — Progress Notes (Signed)
   Subjective:    Patient ID: [name], female    DOB: 1965-10-21, 48 y.o.   MRN: [id number]  HPI 48 year old female with a history of type 2 diabetes (newly dx?), obesity, left total knee replacement 2007, right total knee replacement 2012. She came to [facility] on January 30, 2013 with two days of right knee pain, fever, sweats, and hot flashes. In the emergency room her temperature was 100.8. She had been seen previously in her orthopedist office and had an arthrocentesis. In hospital she was started on vancomycin. On October 7, she underwent irrigation and debridement of her right total knee replacement with exchange of parts. Cipro was added to her antibiotic regimen on October 8. She was found to have group B strep in her culture from her outpatient aspiration. On October 9 her antibiotics were changed to ceftriaxone 2 g once daily and she was discharged home. 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 CRP 31.7. ESR 113.  She was seen in ID f/u on 02-15-13 and was doing well. Repeat ESR 79 and CRP 6.3. ESR 34 and CRP 1.9 (03-14-13).  She was seen in ID f/u on 03-14-13 and her IV anbx were stopped, PIC pulled and she was started on levaquin.  At her f/u 06-13-13 she had ESR 23 and CRP 1.7.  Has had f/u with ortho. Saw FP as well (A1C has decreased to mid-5s). Has also seen Dr Sharol Given and has collapsed arches /severe osteoarthritis, is having orthotics made (surgery not an option).Has lost 40#. Quit taking phenteriamine (made her hungry).  Had f/u with Dr Durward Fortes, feels like she is doing well.  Still taking levaquin, no problems. No f/c. 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 Has inserts for her shoes.  Has been having menopausal symptoms- hot flashes. Has f/u with GYN in October.     Review of Systems  Constitutional: Negative for fever, chills, appetite change and unexpected weight change.    Musculoskeletal: Positive for arthralgias.       Objective:   Physical Exam  Constitutional: She appears well-developed and well-nourished.  Musculoskeletal:       Legs:         Assessment & Plan:

## 2013-11-13 NOTE — Assessment & Plan Note (Signed)
She continues on levaquin. She started tx in October 2014. We discussed options of stopping her anbx- check esr and crp- indefinite anbx- continue anbx for 1 year.  Will recheck her labs today. Will see her back in October, re-discuss options then.

## 2013-11-14 LAB — SEDIMENTATION RATE: Sed Rate: 14 mm/hr (ref 0–22)

## 2013-11-14 LAB — C-REACTIVE PROTEIN: CRP: 1.3 mg/dL — ABNORMAL HIGH (ref <0.60)

## 2013-12-20 DIAGNOSIS — G4733 Obstructive sleep apnea (adult) (pediatric): Secondary | ICD-10-CM | POA: Insufficient documentation

## 2013-12-20 DIAGNOSIS — G4710 Hypersomnia, unspecified: Secondary | ICD-10-CM | POA: Insufficient documentation

## 2013-12-20 DIAGNOSIS — R5383 Other fatigue: Secondary | ICD-10-CM | POA: Insufficient documentation

## 2013-12-20 DIAGNOSIS — R0683 Snoring: Secondary | ICD-10-CM | POA: Insufficient documentation

## 2014-02-13 ENCOUNTER — Ambulatory Visit: Payer: BC Managed Care – PPO | Admitting: Infectious Diseases

## 2014-03-12 ENCOUNTER — Other Ambulatory Visit (HOSPITAL_COMMUNITY): Payer: Self-pay | Admitting: Orthopedic Surgery

## 2014-03-13 ENCOUNTER — Encounter: Payer: Self-pay | Admitting: Infectious Diseases

## 2014-03-13 ENCOUNTER — Ambulatory Visit (INDEPENDENT_AMBULATORY_CARE_PROVIDER_SITE_OTHER): Payer: BC Managed Care – PPO | Admitting: Infectious Diseases

## 2014-03-13 VITALS — BP 137/71 | HR 91 | Temp 98.3°F | Wt 283.0 lb

## 2014-03-13 DIAGNOSIS — Z96659 Presence of unspecified artificial knee joint: Principal | ICD-10-CM

## 2014-03-13 DIAGNOSIS — T8450XD Infection and inflammatory reaction due to unspecified internal joint prosthesis, subsequent encounter: Secondary | ICD-10-CM | POA: Diagnosis not present

## 2014-03-13 DIAGNOSIS — T8459XD Infection and inflammatory reaction due to other internal joint prosthesis, subsequent encounter: Secondary | ICD-10-CM

## 2014-03-13 DIAGNOSIS — R739 Hyperglycemia, unspecified: Secondary | ICD-10-CM

## 2014-03-13 NOTE — Progress Notes (Signed)
Subjective:    Patient ID: [name], female    DOB: 11-Oct-1965, 48 y.o.   MRN: [id number]  HPI 48 yo F with a history of type 2 diabetes (newly dx?), obesity, left total knee replacement 2007, right total knee replacement 2012. She came to [facility] on January 30, 2013 with two days of right knee pain, fever, sweats, and hot flashes. In the emergency room her temperature was 100.8. She had been seen previously in her orthopedist office and had an arthrocentesis. In hospital she was started on vancomycin. On October 7, she underwent irrigation and debridement of her right total knee replacement with exchange of parts. Cipro was added to her antibiotic regimen on October 8. She was found to have group B strep in her culture from her outpatient aspiration. On October 9 her antibiotics were changed to ceftriaxone 2 g once daily and she was discharged home. 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 CRP 31.7. ESR 113.  She was seen in ID f/u on 02-15-13 and was doing well. Repeat ESR 79 and CRP 6.3. ESR 34 and CRP 1.9 (03-14-13).  She was seen in ID f/u on 03-14-13 and her IV anbx were stopped, PIC pulled and she was started on levaquin.  At her f/u 06-13-13 she had ESR 23 and CRP 1.7.  Has had f/u with ortho. Saw FP as well (A1C has decreased to mid-5s). Has had no further success with wt loss. Has been less and les active due to pain in her feet.   CRP 1.3 and ESR 14 (July 2015).  Had f/u with Dr Durward Fortes, has been happy with the progress of her knees.  Has seen Dr Sharol Given due to feet collapsing. Has been experimenting with inserts, planned for L foot surgery 11-21.  Was planned to stop her anbx at 1 year (Oct 2015). Has had occas thrush, tx with vinegar mouth wash. Normal BM. Eating well, no change in appetite or dysgeusia.  No fever or chills, does have night sweats which she relates to hormonal changes.   Review of Systems     Objective:   Physical Exam    Constitutional: She appears well-developed and well-nourished.  Musculoskeletal:       Legs:         Assessment & Plan:

## 2014-03-13 NOTE — Pre-Procedure Instructions (Signed)
[name]  03/13/2014   Your procedure is scheduled on:  Friday March 16, 2014 at 12:15 PM.  [facility] at 10:15 AM.  Call this number if you have problems the morning of surgery: [phone]   Call this number if you have any questions prior to surgery: [phone]   Remember:   Do not eat food or drink liquids after midnight.   Take these medicines the morning of surgery with A SIP OF WATER: Duloxetine (Cymbalta), Hydrocodone if needed levaquin   Please stop taking , Diclofenac, Glucosamine, and Multivitamin    Do not wear jewelry, make-up or nail polish.  Do not wear lotions, powders, or perfumes.   Do not shave 48 hours prior to surgery.   Do not bring valuables to the hospital.  [facility] is not responsible for any belongings or valuables.               Contacts, dentures or bridgework may not be worn into surgery.  Leave suitcase in the car. After surgery it may be brought to your room.  For patients admitted to the hospital, discharge time is determined by your treatment team.               Patients discharged the day of surgery will not be allowed to drive home.  Name and phone number of your driver:   Special Instructions: Shower using CHG soap the night before and the morning of your surgery   Please read over the following fact sheets that you were given: Pain Booklet, Coughing and Deep Breathing and Surgical Site Infection Prevention

## 2014-03-13 NOTE — Assessment & Plan Note (Signed)
Has had PCP f/u and her A1C has been normal. DM2 removed from her problem list.

## 2014-03-13 NOTE — Assessment & Plan Note (Addendum)
She is doing well. Has surpassed 1 yr of anbx. Will plan on staying on anbx at least through her foot surgery. Will recheck her ESR and CRP today. She has med refills. Will see her back in 3 months. We discussed the "pros and cons" of prolonged anbx. Will revisit at her f/u appt.

## 2014-03-14 ENCOUNTER — Encounter (HOSPITAL_COMMUNITY): Payer: Self-pay

## 2014-03-14 ENCOUNTER — Encounter (HOSPITAL_COMMUNITY)
Admission: RE | Admit: 2014-03-14 | Discharge: 2014-03-14 | Disposition: A | Discharge status: Home / Self Care | Payer: BC Managed Care – PPO | Source: Ambulatory Visit | Attending: Orthopedic Surgery | Admitting: Orthopedic Surgery

## 2014-03-14 DIAGNOSIS — G4730 Sleep apnea, unspecified: Secondary | ICD-10-CM | POA: Diagnosis not present

## 2014-03-14 DIAGNOSIS — A5216 Charcot's arthropathy (tabetic): Secondary | ICD-10-CM | POA: Diagnosis not present

## 2014-03-14 HISTORY — DX: Sleep apnea, unspecified: G47.30

## 2014-03-14 LAB — CBC
HEMATOCRIT: 36.7 % (ref 36.0–46.0)
Hemoglobin: 12 g/dL (ref 12.0–15.0)
MCH: 29.4 pg (ref 26.0–34.0)
MCHC: 32.7 g/dL (ref 30.0–36.0)
MCV: 90 fL (ref 78.0–100.0)
PLATELETS: 225 10*3/uL (ref 150–400)
RBC: 4.08 MIL/uL (ref 3.87–5.11)
RDW: 14.4 % (ref 11.5–15.5)
WBC: 5.6 10*3/uL (ref 4.0–10.5)

## 2014-03-14 LAB — COMPREHENSIVE METABOLIC PANEL
ALBUMIN: 3.3 g/dL — AB (ref 3.5–5.2)
ALK PHOS: 79 U/L (ref 39–117)
ALT: 16 U/L (ref 0–35)
ANION GAP: 15 (ref 5–15)
AST: 17 U/L (ref 0–37)
BUN: 18 mg/dL (ref 6–23)
CHLORIDE: 101 meq/L (ref 96–112)
CO2: 23 mEq/L (ref 19–32)
Calcium: 9.6 mg/dL (ref 8.4–10.5)
Creatinine, Ser: 0.53 mg/dL (ref 0.50–1.10)
GFR calc Af Amer: >90 mL/min (ref >90)
GFR calc non Af Amer: >90 mL/min (ref >90)
Glucose, Bld: 193 mg/dL — ABNORMAL HIGH (ref 70–99)
POTASSIUM: 4.4 meq/L (ref 3.7–5.3)
Sodium: 139 mEq/L (ref 137–147)
TOTAL PROTEIN: 6.9 g/dL (ref 6.0–8.3)
Total Bilirubin: 0.3 mg/dL (ref 0.3–1.2)

## 2014-03-14 LAB — PROTIME-INR
INR: 0.98 (ref 0.00–1.49)
PROTHROMBIN TIME: 13.1 s (ref 11.6–15.2)

## 2014-03-14 LAB — APTT: APTT: 27 s (ref 24–37)

## 2014-03-14 LAB — C-REACTIVE PROTEIN: CRP: 1.1 mg/dL — AB (ref <0.60)

## 2014-03-14 LAB — HCG, SERUM, QUALITATIVE: PREG SERUM: NEGATIVE

## 2014-03-14 LAB — SEDIMENTATION RATE: Sed Rate: 12 mm/hr (ref 0–22)

## 2014-03-15 MED ORDER — CEFAZOLIN SODIUM 10 G IJ SOLR
3.0000 g | INTRAMUSCULAR | Status: AC
  Administered 2014-03-16: 3 g via INTRAVENOUS
  Filled 2014-03-15: qty 3000

## 2014-03-16 ENCOUNTER — Ambulatory Visit (HOSPITAL_COMMUNITY)
Admission: RE | Admit: 2014-03-16 | Discharge: 2014-03-16 | Disposition: A | Discharge status: Home / Self Care | Payer: BC Managed Care – PPO | Source: Ambulatory Visit | Attending: Orthopedic Surgery | Admitting: Orthopedic Surgery

## 2014-03-16 ENCOUNTER — Encounter (HOSPITAL_COMMUNITY): Payer: Self-pay | Admitting: *Deleted

## 2014-03-16 ENCOUNTER — Ambulatory Visit (HOSPITAL_COMMUNITY): Payer: BC Managed Care – PPO | Admitting: Anesthesiology

## 2014-03-16 ENCOUNTER — Encounter (HOSPITAL_COMMUNITY)
Admission: RE | Disposition: A | Discharge status: Home / Self Care | Payer: Self-pay | Source: Ambulatory Visit | Attending: Orthopedic Surgery

## 2014-03-16 DIAGNOSIS — G4730 Sleep apnea, unspecified: Secondary | ICD-10-CM | POA: Insufficient documentation

## 2014-03-16 DIAGNOSIS — E11610 Type 2 diabetes mellitus with diabetic neuropathic arthropathy: Secondary | ICD-10-CM

## 2014-03-16 DIAGNOSIS — A5216 Charcot's arthropathy (tabetic): Secondary | ICD-10-CM | POA: Diagnosis not present

## 2014-03-16 HISTORY — PX: ORIF ANKLE FRACTURE: SHX5408

## 2014-03-16 LAB — GLUCOSE, CAPILLARY: Glucose-Capillary: 114 mg/dL — ABNORMAL HIGH (ref 70–99)

## 2014-03-16 SURGERY — OPEN REDUCTION INTERNAL FIXATION (ORIF) ANKLE FRACTURE
Anesthesia: General | Site: Foot | Laterality: Left

## 2014-03-16 MED ORDER — DIPHENHYDRAMINE HCL 50 MG/ML IJ SOLN
INTRAMUSCULAR | Status: DC | PRN
  Administered 2014-03-16: 12.5 mg via INTRAVENOUS

## 2014-03-16 MED ORDER — PROPOFOL 10 MG/ML IV BOLUS
INTRAVENOUS | Status: AC
  Filled 2014-03-16: qty 20

## 2014-03-16 MED ORDER — ONDANSETRON HCL 4 MG/2ML IJ SOLN
INTRAMUSCULAR | Status: DC | PRN
  Administered 2014-03-16: 4 mg via INTRAVENOUS

## 2014-03-16 MED ORDER — FENTANYL CITRATE 0.05 MG/ML IJ SOLN
INTRAMUSCULAR | Status: AC
  Filled 2014-03-16: qty 5

## 2014-03-16 MED ORDER — SCOPOLAMINE 1 MG/3DAYS TD PT72
MEDICATED_PATCH | TRANSDERMAL | Status: AC
  Filled 2014-03-16: qty 1

## 2014-03-16 MED ORDER — LACTATED RINGERS IV SOLN
INTRAVENOUS | Status: DC
  Administered 2014-03-16: 11:00:00 via INTRAVENOUS

## 2014-03-16 MED ORDER — ONDANSETRON HCL 4 MG/2ML IJ SOLN
INTRAMUSCULAR | Status: AC
  Filled 2014-03-16: qty 2

## 2014-03-16 MED ORDER — DEXAMETHASONE SODIUM PHOSPHATE 10 MG/ML IJ SOLN
INTRAMUSCULAR | Status: DC | PRN
  Administered 2014-03-16: 10 mg via INTRAVENOUS

## 2014-03-16 MED ORDER — DIPHENHYDRAMINE HCL 50 MG/ML IJ SOLN
INTRAMUSCULAR | Status: AC
  Filled 2014-03-16: qty 1

## 2014-03-16 MED ORDER — FENTANYL CITRATE 0.05 MG/ML IJ SOLN
INTRAMUSCULAR | Status: AC
  Filled 2014-03-16: qty 2

## 2014-03-16 MED ORDER — MIDAZOLAM HCL 2 MG/2ML IJ SOLN
INTRAMUSCULAR | Status: AC
  Filled 2014-03-16: qty 2

## 2014-03-16 MED ORDER — LACTATED RINGERS IV SOLN
INTRAVENOUS | Status: DC | PRN
  Administered 2014-03-16 (×2): via INTRAVENOUS

## 2014-03-16 MED ORDER — FENTANYL CITRATE 0.05 MG/ML IJ SOLN
INTRAMUSCULAR | Status: DC | PRN
  Administered 2014-03-16: 50 ug via INTRAVENOUS
  Administered 2014-03-16: 100 ug via INTRAVENOUS
  Administered 2014-03-16 (×2): 50 ug via INTRAVENOUS

## 2014-03-16 MED ORDER — LIDOCAINE HCL (CARDIAC) 20 MG/ML IV SOLN
INTRAVENOUS | Status: DC | PRN
  Administered 2014-03-16: 100 mg via INTRAVENOUS

## 2014-03-16 MED ORDER — DIPHENHYDRAMINE HCL 50 MG/ML IJ SOLN: 10.0000 mg | Freq: Once | INTRAMUSCULAR | Status: DC

## 2014-03-16 MED ORDER — MIDAZOLAM HCL 5 MG/5ML IJ SOLN
INTRAMUSCULAR | Status: DC | PRN
  Administered 2014-03-16 (×2): 1 mg via INTRAVENOUS

## 2014-03-16 MED ORDER — SODIUM CHLORIDE 0.9 % IJ SOLN
INTRAMUSCULAR | Status: AC
  Filled 2014-03-16: qty 10

## 2014-03-16 MED ORDER — LIDOCAINE HCL (CARDIAC) 20 MG/ML IV SOLN
INTRAVENOUS | Status: AC
  Filled 2014-03-16: qty 5

## 2014-03-16 MED ORDER — DEXAMETHASONE SODIUM PHOSPHATE 10 MG/ML IJ SOLN
INTRAMUSCULAR | Status: AC
  Filled 2014-03-16: qty 1

## 2014-03-16 MED ORDER — FENTANYL CITRATE 0.05 MG/ML IJ SOLN
25.0000 ug | INTRAMUSCULAR | Status: DC | PRN
  Administered 2014-03-16: 25 ug via INTRAVENOUS

## 2014-03-16 MED ORDER — SCOPOLAMINE 1 MG/3DAYS TD PT72
1.0000 | MEDICATED_PATCH | Freq: Once | TRANSDERMAL | Status: DC
  Administered 2014-03-16: 1.5 mg via TRANSDERMAL

## 2014-03-16 MED ORDER — 0.9 % SODIUM CHLORIDE (POUR BTL) OPTIME
TOPICAL | Status: DC | PRN
  Administered 2014-03-16: 1000 mL

## 2014-03-16 MED ORDER — PROMETHAZINE HCL 25 MG/ML IJ SOLN: 6.2500 mg | INTRAMUSCULAR | Status: DC | PRN

## 2014-03-16 MED ORDER — MEPERIDINE HCL 25 MG/ML IJ SOLN: 6.2500 mg | INTRAMUSCULAR | Status: DC | PRN

## 2014-03-16 MED ORDER — PROPOFOL 10 MG/ML IV BOLUS
INTRAVENOUS | Status: DC | PRN
  Administered 2014-03-16: 160 mg via INTRAVENOUS
  Administered 2014-03-16 (×2): 20 mg via INTRAVENOUS

## 2014-03-16 SURGICAL SUPPLY — 44 items
2.8MM GUIDE WIRE W/FLUTES ×3 IMPLANT
BANDAGE ESMARK 6X9 LF (GAUZE/BANDAGES/DRESSINGS) ×1 IMPLANT
BIT DRILL CANN LRG QC 5X300 (BIT) ×3 IMPLANT
BNDG COHESIVE 4X5 TAN STRL (GAUZE/BANDAGES/DRESSINGS) ×3 IMPLANT
BNDG ESMARK 6X9 LF (GAUZE/BANDAGES/DRESSINGS) ×3
BNDG GAUZE ELAST 4 BULKY (GAUZE/BANDAGES/DRESSINGS) ×3 IMPLANT
COVER SURGICAL LIGHT HANDLE (MISCELLANEOUS) ×3 IMPLANT
CUFF TOURNIQUET SINGLE 34IN LL (TOURNIQUET CUFF) ×3 IMPLANT
CUFF TOURNIQUET SINGLE 44IN (TOURNIQUET CUFF) IMPLANT
DRAPE INCISE IOBAN 66X45 STRL (DRAPES) ×3 IMPLANT
DRAPE OEC MINIVIEW 54X84 (DRAPES) IMPLANT
DRAPE PROXIMA HALF (DRAPES) ×3 IMPLANT
DRAPE U-SHAPE 47X51 STRL (DRAPES) ×3 IMPLANT
DRSG ADAPTIC 3X8 NADH LF (GAUZE/BANDAGES/DRESSINGS) ×3 IMPLANT
DRSG PAD ABDOMINAL 8X10 ST (GAUZE/BANDAGES/DRESSINGS) ×3 IMPLANT
DURAPREP 26ML APPLICATOR (WOUND CARE) ×3 IMPLANT
ELECT REM PT RETURN 9FT ADLT (ELECTROSURGICAL) ×3
ELECTRODE REM PT RTRN 9FT ADLT (ELECTROSURGICAL) ×1 IMPLANT
GAUZE SPONGE 4X4 12PLY STRL (GAUZE/BANDAGES/DRESSINGS) ×3 IMPLANT
GLOVE BIOGEL PI IND STRL 9 (GLOVE) ×1 IMPLANT
GLOVE BIOGEL PI INDICATOR 9 (GLOVE) ×2
GLOVE SURG ORTHO 9.0 STRL STRW (GLOVE) ×3 IMPLANT
GOWN STRL REUS W/ TWL LRG LVL3 (GOWN DISPOSABLE) ×1 IMPLANT
GOWN STRL REUS W/ TWL XL LVL3 (GOWN DISPOSABLE) ×1 IMPLANT
GOWN STRL REUS W/TWL 2XL LVL3 (GOWN DISPOSABLE) ×3 IMPLANT
GOWN STRL REUS W/TWL LRG LVL3 (GOWN DISPOSABLE) ×2
GOWN STRL REUS W/TWL XL LVL3 (GOWN DISPOSABLE) ×2
KIT BASIN OR (CUSTOM PROCEDURE TRAY) ×3 IMPLANT
KIT ROOM TURNOVER OR (KITS) ×3 IMPLANT
MANIFOLD NEPTUNE II (INSTRUMENTS) ×3 IMPLANT
NS IRRIG 1000ML POUR BTL (IV SOLUTION) ×3 IMPLANT
PACK ORTHO EXTREMITY (CUSTOM PROCEDURE TRAY) ×3 IMPLANT
PAD ARMBOARD 7.5X6 YLW CONV (MISCELLANEOUS) ×6 IMPLANT
PADDING CAST COTTON 6X4 STRL (CAST SUPPLIES) ×3 IMPLANT
SCREW COMP HEADLESS 6.5X120 (Screw) ×3 IMPLANT
SPONGE LAP 18X18 X RAY DECT (DISPOSABLE) ×3 IMPLANT
STAPLER VISISTAT 35W (STAPLE) IMPLANT
SUCTION FRAZIER TIP 10 FR DISP (SUCTIONS) ×3 IMPLANT
SUT ETHILON 2 0 PSLX (SUTURE) ×6 IMPLANT
SUT VIC AB 2-0 CTB1 (SUTURE) ×6 IMPLANT
TOWEL OR 17X24 6PK STRL BLUE (TOWEL DISPOSABLE) ×3 IMPLANT
TOWEL OR 17X26 10 PK STRL BLUE (TOWEL DISPOSABLE) ×3 IMPLANT
TUBE CONNECTING 12'X1/4 (SUCTIONS) ×1
TUBE CONNECTING 12X1/4 (SUCTIONS) ×2 IMPLANT

## 2014-03-16 NOTE — Anesthesia Postprocedure Evaluation (Signed)
  Anesthesia Post-op Note  Patient: [name]  Procedure(s) Performed: Procedure(s): Foot Excision Charcot Collapse,  Internal Fixation (Left)  Patient Location: PACU  Anesthesia Type:General  Level of Consciousness: awake, alert  and oriented  Airway and Oxygen Therapy: Patient Spontanous Breathing  Post-op Pain: mild  Post-op Assessment: Post-op Vital signs reviewed  Post-op Vital Signs: Reviewed  Last Vitals:  Filed Vitals:   03/16/14 1433  BP: 145/65  Pulse: 76  Temp:   Resp: 15    Complications: No apparent anesthesia complications

## 2014-03-16 NOTE — Anesthesia Preprocedure Evaluation (Addendum)
Anesthesia Evaluation  Patient identified by MRN, date of birth, ID band Patient awake    Reviewed: Allergy & Precautions, H&P , NPO status , Patient's Chart, lab work & pertinent test results, reviewed documented beta blocker date and time   History of Anesthesia Complications Negative for: history of anesthetic complications  Airway Mallampati: I  TM Distance: >3 FB Neck ROM: Full    Dental  (+) Teeth Intact, Dental Advisory Given   Pulmonary sleep apnea (BiPAP) ,  breath sounds clear to auscultation        Cardiovascular negative cardio ROS  Rhythm:Regular Rate:Normal  ECHO 2013 EF 55-60%, valves OK   Neuro/Psych negative neurological ROS     GI/Hepatic negative GI ROS, Neg liver ROS,   Endo/Other  Morbid obesityGlu 114  Renal/GU negative Renal ROS     Musculoskeletal  (+) Arthritis -, Osteoarthritis,    Abdominal (+) + obese,   Peds  Hematology   Anesthesia Other Findings   Reproductive/Obstetrics 03/14/14 preg test: NEG                         Anesthesia Physical Anesthesia Plan  ASA: III  Anesthesia Plan: General   Post-op Pain Management:    Induction: Intravenous  Airway Management Planned: Oral ETT  Additional Equipment:   Intra-op Plan:   Post-operative Plan: Extubation in OR  Informed Consent: I have reviewed the patients History and Physical, chart, labs and discussed the procedure including the risks, benefits and alternatives for the proposed anesthesia with the patient or authorized representative who has indicated his/her understanding and acceptance.   Dental advisory given  Plan Discussed with: CRNA and Surgeon  Anesthesia Plan Comments: (Plan routine monitors, GA- LMA OK Pt has had septic TKR in past, declines popliteal block)       Anesthesia Quick Evaluation

## 2014-03-16 NOTE — Anesthesia Procedure Notes (Signed)
Procedure Name: LMA Insertion Date/Time: 03/16/2014 12:39 PM Performed by: Armandina GemmaMIRARCHI, Lindel Marcell Pre-anesthesia Checklist: Patient identified, Timeout performed, Emergency Drugs available, Suction available and Patient being monitored Patient Re-evaluated:Patient Re-evaluated prior to inductionOxygen Delivery Method: Circle system utilized Preoxygenation: Pre-oxygenation with 100% oxygen Intubation Type: IV induction LMA: LMA inserted LMA Size: 4.0 Tube type: Oral Number of attempts: 1 Placement Confirmation: breath sounds checked- equal and bilateral and positive ETCO2 Tube secured with: Tape Dental Injury: Teeth and Oropharynx as per pre-operative assessment  Comments: Iv induction Jean Rosenthaljackson- jackson supervised and directed lma insertion by [employer] brennan emt student- pt was agreeable preop

## 2014-03-16 NOTE — H&P (Signed)
Takeisha Cianci Fallin is an 48 y.o. female.   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 She has failed conservative care.  Past Medical History  Diagnosis Date  . Pneumonia     hx 20+ years ago  . Headache(784.0)     hormonal headache  . Arthritis   . Neuropathy     bilateral feet  . Ulcer of foot     toe ulcer greater than a year ago  . Obese   . Sleep apnea     bipap 1 yr    Past Surgical History  Procedure Laterality Date  . Knee arthroscopy      left knee  . Replacement total knee      left  . Total knee arthroplasty  06/23/2011    Procedure: TOTAL KNEE ARTHROPLASTY;  Surgeon: Garald Balding, MD;  Location: [place];  Service: Orthopedics;  Laterality: Right;  RIGHT TOTAL KNEE REPLACEMENT   . Incision and drainage Right 01/31/2013    ANTIBIOTIC SPACER  RIGHT KNEE  DR Durward Fortes   . I&d knee with poly exchange Right 01/31/2013    Procedure: IRRIGATION AND DEBRIDEMENT KNEE WITH POLY EXCHANGE possible Antibiotic Spacer;  Surgeon: Garald Balding, MD;  Location: [place];  Service: Orthopedics;  Laterality: Right;    Family History  Problem Relation Age of Onset  . Anesthesia problems Father   . Stroke Father   . Hypotension Neg Hx   . Malignant hyperthermia Neg Hx   . Pseudochol deficiency Neg Hx    Social History:  reports that she has never smoked. She has never used smokeless tobacco. She reports that she drinks alcohol. She reports that she does not use illicit drugs.  Allergies: No Known Allergies  No prescriptions prior to admission    Results for orders placed or performed during the hospital encounter of 03/14/14 (from the past 48 hour(s))  APTT     Status: None   Collection Time: 03/14/14 11:02 AM  Result Value Ref Range   aPTT 27 24 - 37 seconds  CBC     Status: None   Collection Time: 03/14/14 11:02 AM  Result Value Ref Range   WBC 5.6 4.0 - 10.5 K/uL   RBC 4.08 3.87 - 5.11 MIL/uL   Hemoglobin 12.0 12.0 - 15.0 g/dL   HCT 36.7 36.0 - 46.0 %   MCV 90.0 78.0 - 100.0 fL   MCH 29.4 26.0 - 34.0 pg   MCHC 32.7 30.0 - 36.0 g/dL   RDW 14.4 11.5 - 15.5 %   Platelets 225 150 - 400 K/uL  Comprehensive metabolic panel     Status: Abnormal   Collection Time: 03/14/14 11:02 AM  Result Value Ref Range   Sodium 139 137 - 147 mEq/L   Potassium 4.4 3.7 - 5.3 mEq/L   Chloride 101 96 - 112 mEq/L   CO2 23 19 - 32 mEq/L   Glucose, Bld 193 (H) 70 - 99 mg/dL   BUN 18 6 - 23 mg/dL   Creatinine, Ser 0.53 0.50 - 1.10 mg/dL   Calcium 9.6 8.4 - 10.5 mg/dL   Total Protein 6.9 6.0 - 8.3 g/dL   Albumin 3.3 (L) 3.5 - 5.2 g/dL   AST 17 0 - 37 U/L   ALT 16 0 - 35 U/L   Alkaline Phosphatase 79 39 - 117 U/L   Total Bilirubin 0.3 0.3 - 1.2 mg/dL  GFR calc non Af Amer >90 >90 mL/min   GFR calc Af Amer >90 >90 mL/min    Comment: (NOTE) The eGFR has been calculated using the CKD EPI equation. This calculation has not been validated in all clinical situations. eGFR's persistently <90 mL/min signify possible Chronic Kidney Disease.    Anion gap 15 5 - 15  Protime-INR     Status: None   Collection Time: 03/14/14 11:02 AM  Result Value Ref Range   Prothrombin Time 13.1 11.6 - 15.2 seconds   INR 0.98 0.00 - 1.49  hCG, serum, qualitative     Status: None   Collection Time: 03/14/14 11:02 AM  Result Value Ref Range   Preg, Serum NEGATIVE NEGATIVE    Comment:        THE SENSITIVITY OF THIS METHODOLOGY IS >10 mIU/mL.    No results found.  Review of Systems  All other systems reviewed and are negative.   There were no vitals taken for this visit. Physical Exam  On examination patient has palpable pulses. 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  Plan: We'll plan for excision of the bone plan for  internal fixation of the foot risks and benefits were discussed including infection neurovascular injury recurrent collapse of the foot patient states she understands and wished to proceed at this time.  Kemon Devincenzi V 03/16/2014, 6:48 AM

## 2014-03-16 NOTE — Transfer of Care (Signed)
Immediate Anesthesia Transfer of Care Note  Patient: [name]  Procedure(s) Performed: Procedure(s): Foot Excision Charcot Collapse, Possible Internal Fixation (Left)  Patient Location: PACU  Anesthesia Type:General  Level of Consciousness: awake and alert   Airway & Oxygen Therapy: Patient Spontanous Breathing and Patient connected to face mask oxygen  Post-op Assessment: Report given to PACU RN and Post -op Vital signs reviewed and stable  Post vital signs: Reviewed and stable  Complications: No apparent anesthesia complications

## 2014-03-16 NOTE — Discharge Instructions (Signed)

## 2014-03-16 NOTE — Progress Notes (Signed)
Called R.Fitzgerald for signout. 

## 2014-03-16 NOTE — Op Note (Signed)
03/16/2014  1:35 PM  PATIENT:  [name]    PRE-OPERATIVE DIAGNOSIS:  Charcot Left Foot  POST-OPERATIVE DIAGNOSIS:  Same  PROCEDURE:  Foot Excision Charcot Collapse partial excision of the cuboid and cuneiforms and base of the metatarsals. Internal fixation with a 120 mm Synthes screw to stabilize the first metatarsal medial cuneiform navicular and talus.  SURGEON:  Nadara MustardUDA,Ulani Degrasse V, MD  PHYSICIAN ASSISTANT:None ANESTHESIA:   General  PREOPERATIVE INDICATIONS:  [name] is a  48 y.o. female with a diagnosis of Charcot Left Foot who failed conservative measures and elected for surgical management.    The risks benefits and alternatives were discussed with the patient preoperatively including but not limited to the risks of infection, bleeding, nerve injury, cardiopulmonary complications, the need for revision surgery, among others, and the patient was willing to proceed.  OPERATIVE IMPLANTS: 120 x 6.5 mm headless compression screw by Synthes  OPERATIVE FINDINGS: Unstable Charcot collapse  OPERATIVE PROCEDURE: Patient was brought to the operating room and underwent a general anesthetic. After adequate levels of anesthesia were obtained patient's left lower extremity was prepped using DuraPrep draped into a sterile field. A longitudinal incision was made plantarly along the Charcot collapse. Blunt dissection was carried down to the prominent bones and the bones were resected using an osteotome with resection of the partial cuboid and cuneiforms and base of the metatarsals. Hemostasis was obtained the wound was irrigated with normal saline and the incision closed using 2-0 nylon. Attention was then focused along the medial column medial column was unstable with flexion extension varus and valgus stress. An incision was made over the MTP joint the great toe. A guidewire was inserted from the metatarsal head down the shaft of the first metatarsal through the medial cuneiform  navicular and talar head. C-arm fluoroscopy verified alignment of both AP and lateral planes. This was then secured using a headless compression screw Synthes C-arm fluoroscopy verified alignment. The wound was irrigated with normal saline the incision closed using 2-0 nylon. The wound was covered with a sterile compressive dressing. Patient was extubated taken to the PACU in stable condition.

## 2014-03-17 NOTE — Discharge Summary (Signed)
Physician Discharge Summary  Patient ID: [name] MRN: [id number] DOB/AGE: 48/09/1965 48 y.o.  Admit date: 03/16/2014 Discharge date: 03/17/2014  Admission Diagnoses: Left foot Charcot collapse with rocker-bottom deformity and ulceration  Discharge Diagnoses: Same Active Problems:   * No active hospital problems. *   Discharged Condition: stable  Hospital Course: Patient's hospital course was unremarkable she underwent surgery as noted in the surgical note and was discharged to home postoperatively  Consults: None  Significant Diagnostic Studies: labs: Routine labs  Treatments: surgery: See operative note  Discharge Exam: Blood pressure 145/65, pulse 76, temperature 97.2 F (36.2 C), temperature source Oral, resp. rate 15, height 5\' 4"  (1.626 m), weight 127.914 kg (282 lb), SpO2 96 %. Incision/Wound: dressing clean and dry  Disposition: 01-Home or Self Care  Discharge Instructions    Call MD / Call 911    Complete by:  As directed   If you experience chest pain or shortness of breath, CALL 911 and be transported to the hospital emergency room.  If you develope a fever above 101 F, pus (white drainage) or increased drainage or redness at the wound, or calf pain, call your surgeon's office.     Constipation Prevention    Complete by:  As directed   Drink plenty of fluids.  Prune juice may be helpful.  You may use a stool softener, such as Colace (over the counter) 100 mg twice a day.  Use MiraLax (over the counter) for constipation as needed.     Diet - low sodium heart healthy    Complete by:  As directed      Increase activity slowly as tolerated    Complete by:  As directed      Post-op shoe    Complete by:  As directed      Touch down weight bearing    Complete by:  As directed             Medication List    TAKE these medications        cetirizine 10 MG tablet  Commonly known as:  ZYRTEC  Take 10 mg by mouth daily as needed for allergies.     diclofenac 75 MG EC tablet  Commonly known as:  VOLTAREN  Take 75 mg by mouth daily.     DULoxetine 60 MG capsule  Commonly known as:  CYMBALTA  Take 60 mg by mouth daily.     glucosamine-chondroitin 500-400 MG tablet  Take 2 tablets by mouth daily.     HYDROcodone-acetaminophen 10-325 MG per tablet  Commonly known as:  NORCO  Take 1 tablet by mouth every 8 (eight) hours as needed for moderate pain or severe pain.     levofloxacin 500 MG tablet  Commonly known as:  LEVAQUIN  Take 1 tablet (500 mg total) by mouth daily.     multivitamin with minerals Tabs tablet  Take 1 tablet by mouth daily.           Follow-up Information    Follow up with Aditri Louischarles V, MD In 1 week.   Specialty:  Orthopedic Surgery   Contact information:   [address] [place] [address] [phone]       Signed: Nadara MustardDUDA,Lavergne Hiltunen V 03/17/2014, 8:31 AM

## 2014-03-21 ENCOUNTER — Encounter (HOSPITAL_COMMUNITY): Payer: Self-pay | Admitting: Orthopedic Surgery

## 2014-03-23 ENCOUNTER — Encounter (HOSPITAL_COMMUNITY): Payer: Self-pay | Admitting: *Deleted

## 2014-03-23 ENCOUNTER — Emergency Department (HOSPITAL_COMMUNITY)
Admission: EM | Admit: 2014-03-23 | Discharge: 2014-03-23 | Disposition: A | Discharge status: Home / Self Care | Payer: BC Managed Care – PPO | Attending: Emergency Medicine | Admitting: Emergency Medicine

## 2014-03-23 ENCOUNTER — Emergency Department (HOSPITAL_COMMUNITY): Payer: BC Managed Care – PPO

## 2014-03-23 DIAGNOSIS — Z872 Personal history of diseases of the skin and subcutaneous tissue: Secondary | ICD-10-CM | POA: Insufficient documentation

## 2014-03-23 DIAGNOSIS — E1140 Type 2 diabetes mellitus with diabetic neuropathy, unspecified: Secondary | ICD-10-CM | POA: Diagnosis not present

## 2014-03-23 DIAGNOSIS — Z79899 Other long term (current) drug therapy: Secondary | ICD-10-CM | POA: Insufficient documentation

## 2014-03-23 DIAGNOSIS — Z792 Long term (current) use of antibiotics: Secondary | ICD-10-CM | POA: Diagnosis not present

## 2014-03-23 DIAGNOSIS — Z791 Long term (current) use of non-steroidal anti-inflammatories (NSAID): Secondary | ICD-10-CM | POA: Insufficient documentation

## 2014-03-23 DIAGNOSIS — M1990 Unspecified osteoarthritis, unspecified site: Secondary | ICD-10-CM | POA: Diagnosis not present

## 2014-03-23 DIAGNOSIS — Z8701 Personal history of pneumonia (recurrent): Secondary | ICD-10-CM | POA: Insufficient documentation

## 2014-03-23 DIAGNOSIS — G4730 Sleep apnea, unspecified: Secondary | ICD-10-CM | POA: Diagnosis not present

## 2014-03-23 DIAGNOSIS — Z01818 Encounter for other preprocedural examination: Secondary | ICD-10-CM

## 2014-03-23 DIAGNOSIS — G8918 Other acute postprocedural pain: Secondary | ICD-10-CM | POA: Diagnosis not present

## 2014-03-23 DIAGNOSIS — Z9889 Other specified postprocedural states: Secondary | ICD-10-CM | POA: Insufficient documentation

## 2014-03-23 DIAGNOSIS — M79609 Pain in unspecified limb: Secondary | ICD-10-CM

## 2014-03-23 DIAGNOSIS — Z8781 Personal history of (healed) traumatic fracture: Secondary | ICD-10-CM | POA: Diagnosis not present

## 2014-03-23 DIAGNOSIS — M79672 Pain in left foot: Secondary | ICD-10-CM | POA: Insufficient documentation

## 2014-03-23 DIAGNOSIS — E669 Obesity, unspecified: Secondary | ICD-10-CM | POA: Diagnosis not present

## 2014-03-23 LAB — URINALYSIS, ROUTINE W REFLEX MICROSCOPIC
Bilirubin Urine: NEGATIVE
Glucose, UA: NEGATIVE mg/dL
Hgb urine dipstick: NEGATIVE
Ketones, ur: NEGATIVE mg/dL
LEUKOCYTES UA: NEGATIVE
Nitrite: NEGATIVE
PROTEIN: NEGATIVE mg/dL
Specific Gravity, Urine: 1.021 (ref 1.005–1.030)
UROBILINOGEN UA: 0.2 mg/dL (ref 0.0–1.0)
pH: 6.5 (ref 5.0–8.0)

## 2014-03-23 LAB — CBC WITH DIFFERENTIAL/PLATELET
BASOS ABS: 0 10*3/uL (ref 0.0–0.1)
Basophils Relative: 0 % (ref 0–1)
Eosinophils Absolute: 0.1 10*3/uL (ref 0.0–0.7)
Eosinophils Relative: 1 % (ref 0–5)
HCT: 29.2 % — ABNORMAL LOW (ref 36.0–46.0)
Hemoglobin: 9.5 g/dL — ABNORMAL LOW (ref 12.0–15.0)
LYMPHS ABS: 1.4 10*3/uL (ref 0.7–4.0)
Lymphocytes Relative: 14 % (ref 12–46)
MCH: 29.2 pg (ref 26.0–34.0)
MCHC: 32.5 g/dL (ref 30.0–36.0)
MCV: 89.8 fL (ref 78.0–100.0)
Monocytes Absolute: 0.7 10*3/uL (ref 0.1–1.0)
Monocytes Relative: 7 % (ref 3–12)
NEUTROS ABS: 7.9 10*3/uL — AB (ref 1.7–7.7)
Neutrophils Relative %: 78 % — ABNORMAL HIGH (ref 43–77)
PLATELETS: 267 10*3/uL (ref 150–400)
RBC: 3.25 MIL/uL — AB (ref 3.87–5.11)
RDW: 14.4 % (ref 11.5–15.5)
WBC: 10.1 10*3/uL (ref 4.0–10.5)

## 2014-03-23 LAB — HEPATIC FUNCTION PANEL
ALBUMIN: 3 g/dL — AB (ref 3.5–5.2)
ALT: 9 U/L (ref 0–35)
AST: 14 U/L (ref 0–37)
Alkaline Phosphatase: 76 U/L (ref 39–117)
BILIRUBIN TOTAL: 0.6 mg/dL (ref 0.3–1.2)
Bilirubin, Direct: <0.2 mg/dL (ref 0.0–0.3)
Total Protein: 6.6 g/dL (ref 6.0–8.3)

## 2014-03-23 LAB — C-REACTIVE PROTEIN: CRP: 6.8 mg/dL — AB (ref <0.60)

## 2014-03-23 LAB — APTT: aPTT: 29 seconds (ref 24–37)

## 2014-03-23 LAB — I-STAT CHEM 8, ED
BUN: 10 mg/dL (ref 6–23)
CHLORIDE: 98 meq/L (ref 96–112)
CREATININE: 0.6 mg/dL (ref 0.50–1.10)
Calcium, Ion: 1.19 mmol/L (ref 1.12–1.23)
Glucose, Bld: 141 mg/dL — ABNORMAL HIGH (ref 70–99)
HCT: 28 % — ABNORMAL LOW (ref 36.0–46.0)
Hemoglobin: 9.5 g/dL — ABNORMAL LOW (ref 12.0–15.0)
POTASSIUM: 4.7 meq/L (ref 3.7–5.3)
SODIUM: 135 meq/L — AB (ref 137–147)
TCO2: 24 mmol/L (ref 0–100)

## 2014-03-23 LAB — URIC ACID: Uric Acid, Serum: 8.2 mg/dL — ABNORMAL HIGH (ref 2.4–7.0)

## 2014-03-23 LAB — PROTIME-INR
INR: 0.99 (ref 0.00–1.49)
PROTHROMBIN TIME: 13.2 s (ref 11.6–15.2)

## 2014-03-23 LAB — SEDIMENTATION RATE: Sed Rate: 50 mm/hr — ABNORMAL HIGH (ref 0–22)

## 2014-03-23 MED ORDER — OXYCODONE-ACETAMINOPHEN 5-325 MG PO TABS
2.0000 | ORAL_TABLET | Freq: Once | ORAL | Status: AC
  Administered 2014-03-23: 2 via ORAL
  Filled 2014-03-23: qty 2

## 2014-03-23 MED ORDER — SODIUM CHLORIDE 0.9 % IV BOLUS (SEPSIS)
1000.0000 mL | Freq: Once | INTRAVENOUS | Status: AC
  Administered 2014-03-23: 1000 mL via INTRAVENOUS

## 2014-03-23 MED ORDER — IBUPROFEN 800 MG PO TABS
800.0000 mg | ORAL_TABLET | Freq: Once | ORAL | Status: AC
  Administered 2014-03-23: 800 mg via ORAL
  Filled 2014-03-23: qty 1

## 2014-03-23 MED ORDER — HYDROMORPHONE HCL 1 MG/ML IJ SOLN
1.0000 mg | Freq: Once | INTRAMUSCULAR | Status: AC
  Administered 2014-03-23: 1 mg via INTRAVENOUS
  Filled 2014-03-23: qty 1

## 2014-03-23 MED ORDER — OXYCODONE-ACETAMINOPHEN 5-325 MG PO TABS: 2.0000 | ORAL_TABLET | ORAL | Status: DC | PRN

## 2014-03-23 MED ORDER — HYDROMORPHONE HCL 1 MG/ML IJ SOLN
0.5000 mg | Freq: Once | INTRAMUSCULAR | Status: AC
  Administered 2014-03-23: 0.5 mg via INTRAVENOUS
  Filled 2014-03-23: qty 1

## 2014-03-23 MED ORDER — DOXYCYCLINE HYCLATE 100 MG PO CAPS: 100.0000 mg | ORAL_CAPSULE | Freq: Two times a day (BID) | ORAL | Status: DC

## 2014-03-23 MED ORDER — ACETAMINOPHEN 325 MG PO TABS
650.0000 mg | ORAL_TABLET | Freq: Once | ORAL | Status: AC
  Administered 2014-03-23: 650 mg via ORAL
  Filled 2014-03-23: qty 2

## 2014-03-23 NOTE — ED Notes (Signed)
Worsening s/p pain to left foot that is now radiating up to left. Knee. Hx. Of cellulitis from knee surgeries. lt. Lower leg warmer > rt. Pt. Has been doing regular dressing changes and denies any purulent drainage and foul odors. Taking Vicodin, tylenol, nsaids for pain. Febrile this morning > 102.0 f.  Pain started to worsening on Thanksgiving. States, "minimal activity." Called Dr. Violeta GelinasNikita from [facility] and was told to come in.

## 2014-03-23 NOTE — ED Notes (Signed)
[facility]

## 2014-03-23 NOTE — Progress Notes (Signed)
Left lower extremity venous duplex completed.  Left:  No evidence of DVT, superficial thrombosis, or Baker's cyst.  Right:  Negative for DVT in the common femoral vein.  

## 2014-03-23 NOTE — Discharge Instructions (Signed)
Please keep your leg elevated above your heart as often as possible.  Take pain medication as needed.  Continue taking your Levaquin and also taking Doxycycline as well.  Follow up with your orthopedic doctor on Monday for further care.  Return if your condition worsen.   Elastic Bandage and RICE Elastic bandages come in different shapes and sizes. They perform different functions. Your caregiver will help you to decide what is best for your protection, recovery, or rehabilitation following an injury. The following are some general tips to help you use an elastic bandage.  Use the bandage as directed by the maker of the bandage you are using.  Do not wrap it too tight. This may cut off the circulation of the arm or leg below the bandage.  If part of your body beyond the bandage becomes blue, numb, or swollen, it is too tight. Loosen the bandage as needed to prevent these problems.  See your caregiver or trainer if the bandage seems to be making your problems worse rather than better. Bandages may be a reminder to you that you have an injury. However, they provide very little support. The few pounds of support they provide are [place] considering the pressure it takes to injure a joint or tear ligaments. Therefore, the joint will not be able to handle all of the wear and tear it could before the injury. The routine care of many injuries includes Rest, Ice, Compression, and Elevation (RICE).  Rest is required to allow your body to heal. Generally, routine activities can be resumed when comfortable. Injured tendons and bones take about 6 weeks to heal.  Icing the injury helps keep the swelling down and reduces pain. Do not apply ice directly to the skin. Put ice in a plastic bag. Place a towel between the skin and the bag. This will prevent frostbite to the skin. Apply ice bags to the injured area for 15-20 minutes, every 2 hours while awake. Do this for the first 24 to 48 hours, then as directed by your  caregiver.  Compression helps keep swelling down, gives support, and helps with discomfort. If an elastic bandage has been applied today, it should be removed and reapplied every 3 to 4 hours. It should not be applied tightly, but firmly enough to keep swelling down. Watch fingers or toes for swelling, bluish discoloration, coldness, numbness, or increased pain. If any of these problems occur, remove the bandage and reapply it more loosely. If these problems persist, contact your caregiver.  Elevation helps reduce swelling and decreases pain. The injured area (arms, hands, legs, or feet) should be placed near to or above the heart (center of the chest) if able. Persistent pain and inability to use the injured area for more than 2 to 3 days are warning signs. You should see a caregiver for a follow-up visit as soon as possible. Initially, a Nessler broken bone (hairline fracture) may not be seen on X-rays. It may take 7 to 10 days to finally show up. Continued pain and swelling show that further evaluation and/or X-rays are needed. Make a follow-up visit with your caregiver. A specialist in reading X-rays (radiologist) will read your X-rays again. Finding out the results of your test Not all test results are available during your visit. If your test results are not back during the visit, make an appointment with your caregiver to find out the results. Do not assume everything is normal if you have not heard from your caregiver or the  medical facility. It is important for you to follow up on all of your test results. Document Released: 10/03/2001 Document Revised: 07/06/2011 Document Reviewed: 08/15/2007 [facility] Patient Information 2015 [employer] [place]. This information is not intended to replace advice given to you by your health care provider. Make sure you discuss any questions you have with your health care provider.

## 2014-03-23 NOTE — ED Notes (Signed)
Pt foot wrapped with gauze and coban

## 2014-03-23 NOTE — ED Notes (Signed)
Foot wrapped with ace bandage.

## 2014-03-23 NOTE — ED Provider Notes (Signed)
CSN: [id number]     Arrival date & time 03/23/14  4098 History   First MD Initiated Contact with Patient 03/23/14 [phone]     No chief complaint on file.    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 Lajoyce Corners and had a excision of the L foot for internal fixation which was performed a week ago.  She is presenting with post-op pain.  Patient reports since the surgery she has been doing well, she has been changing the dressing on a daily basis and pain is minimal. However since yesterday she has had persistent worsening sharp throbbing pain throughout her entire left leg. Pain is most severe to her left foot and left thigh. She noticed a red streak extending up toward to the thigh. She has been taking her usual pain medication which usually helps however pain medication has not alleviated her pain. She reported fever, chills, body aches, and has a temperature of 102 last night. She did contact the orthopedic doctor today who request patient to come to ER for further evaluation. She has no prior history of PE or DVT, she denies notice any purulent drainage or foul odor from her dressing. She denies any increase in her daily activity. She does not have a history of diabetes.  Past Medical History  Diagnosis Date  . Pneumonia     hx 20+ years ago  . Headache(784.0)     hormonal headache  . Arthritis   . Neuropathy     bilateral feet  . Ulcer of foot     toe ulcer greater than a year ago  . Obese   . Sleep apnea     bipap 1 yr   Past Surgical History  Procedure Laterality Date  . Knee arthroscopy      left knee  . Replacement total knee      left  . Total knee arthroplasty  06/23/2011    Procedure: TOTAL KNEE ARTHROPLASTY;  Surgeon: Valeria Batman, MD;  Location: [facility] OR;  Service:  Orthopedics;  Laterality: Right;  RIGHT TOTAL KNEE REPLACEMENT   . Incision and drainage Right 01/31/2013    ANTIBIOTIC SPACER  RIGHT KNEE  DR Cleophas Dunker   . I&d knee with poly exchange Right 01/31/2013    Procedure: IRRIGATION AND DEBRIDEMENT KNEE WITH POLY EXCHANGE possible Antibiotic Spacer;  Surgeon: Valeria Batman, MD;  Location: [facility] OR;  Service: Orthopedics;  Laterality: Right;  . Orif ankle fracture Left 03/16/2014    Procedure: Foot Excision Charcot Collapse,  Internal Fixation;  Surgeon: Nadara Mustard, MD;  Location: [facility] OR;  Service: Orthopedics;  Laterality: Left;   Family History  Problem Relation Age of Onset  . Anesthesia problems Father   . Stroke Father   . Hypotension Neg Hx   . Malignant hyperthermia Neg Hx   . Pseudochol deficiency Neg Hx    History  Substance Use Topics  . Smoking status: Never Smoker   . Smokeless tobacco: Never Used  . Alcohol Use: 0.0 oz/week    0 Not specified per week     Comment: occ   OB History    No data available     Review of Systems  All other systems reviewed and are negative.     Allergies  Review of patient's allergies indicates no known allergies.  Home Medications   Prior  to Admission medications   Medication Sig Start Date End Date Taking? Authorizing Provider  cetirizine (ZYRTEC) 10 MG tablet Take 10 mg by mouth daily as needed for allergies.    Historical Provider, MD  diclofenac (VOLTAREN) 75 MG EC tablet Take 75 mg by mouth daily. 02/28/14   Historical Provider, MD  DULoxetine (CYMBALTA) 60 MG capsule Take 60 mg by mouth daily.    Historical Provider, MD  glucosamine-chondroitin 500-400 MG tablet Take 2 tablets by mouth daily.     Historical Provider, MD  HYDROcodone-acetaminophen (NORCO) 10-325 MG per tablet Take 1 tablet by mouth every 8 (eight) hours as needed for moderate pain or severe pain.  10/09/13   Historical Provider, MD  levofloxacin (LEVAQUIN) 500 MG tablet Take 1 tablet (500 mg total) by mouth daily.  08/14/13   Ginnie Smart, MD  Multiple Vitamin (MULTIVITAMIN WITH MINERALS) TABS Take 1 tablet by mouth daily.    Historical Provider, MD   BP 149/62 mmHg  Pulse 97  Temp(Src) 100.2 F (37.9 C) (Oral)  Resp 21  SpO2 100%  LMP 03/06/2014 Physical Exam  Constitutional: She appears well-developed and well-nourished. No distress.  HENT:  Head: Atraumatic.  Eyes: Conjunctivae are normal.  Neck: Neck supple.  Cardiovascular: Normal rate and regular rhythm.   Pulmonary/Chest: Effort normal and breath sounds normal.  Abdominal: Soft. There is no tenderness.  Musculoskeletal: She exhibits tenderness (left foot: Well-appearing surgical scar to both dorsal and ventral aspects of foot with tenderness to palpation but no obvious signs of infection. Pedal pulses palpable, brisk cap refill and sensation is intact. No ankle joint involvement.).  Neurological: She is alert.  Skin: No rash noted.  Left thigh: Palpable cords with streaking erythema noted to the anterior aspect of thigh, tender to palpation, trace edema noted to left lower extremities.  Psychiatric: She has a normal mood and affect.  Nursing note and vitals reviewed.   ED Course  Procedures (including critical care time)  9:24 AM Patient here with worsening pain from a postop procedure. She does have palpable cords on her left lower extremities. Doppler ultrasound obtained to rule out DVT. She has a fever and chills. Workup initiated.  Care discussed with Dr. Patria Mane.    10:23 AM I have consulted with Dr. Otelia Sergeant who request a battery of tests which i have ordered per request.  Pt will be admitted for further care.   11:45 AM Dr. Lajoyce Corners came down to see pt.  We had an indepth conversation with Dr. Lajoyce Corners, who recommend having pt elevated leg above heart for the next several days and to see him in office.  Antibiotic can be prescribed.  If positive for DVT, then pt can be started on anticoagulant and f/u outpt.  At this time, low  suspicion for septic knee.  MRI likely not beneficial to r/u foot infection vs charcot.  Pt will need to RICE therapy.  Doxycycline prescribed.    Labs Review Labs Reviewed  CBC WITH DIFFERENTIAL - Abnormal; Notable for the following:    RBC 3.25 (*)    Hemoglobin 9.5 (*)    HCT 29.2 (*)    Neutrophils Relative % 78 (*)    Neutro Abs 7.9 (*)    All other components within normal limits  HEPATIC FUNCTION PANEL - Abnormal; Notable for the following:    Albumin 3.0 (*)    All other components within normal limits  URIC ACID - Abnormal; Notable for the following:    Uric Acid,  Serum 8.2 (*)    All other components within normal limits  SEDIMENTATION RATE - Abnormal; Notable for the following:    Sed Rate 50 (*)    All other components within normal limits  I-STAT CHEM 8, ED - Abnormal; Notable for the following:    Sodium 135 (*)    Glucose, Bld 141 (*)    Hemoglobin 9.5 (*)    HCT 28.0 (*)    All other components within normal limits  CULTURE, BLOOD (ROUTINE X 2)  CULTURE, BLOOD (ROUTINE X 2)  URINE CULTURE  PROTIME-INR  APTT  URINALYSIS, ROUTINE W REFLEX MICROSCOPIC  C-REACTIVE PROTEIN    Imaging Review Dg Chest 2 View  03/23/2014   CLINICAL DATA:  One week post left foot surgery (medial arthrodesis), presenting with severe left foot pain radiating into the knee. Prior history of septicemia with a remote right knee surgery. Preoperative respiratory evaluation.  EXAM: CHEST  2 VIEW  COMPARISON:  01/30/2013, 06/25/2011, 05/01/2010.  FINDINGS: Suboptimal inspiration due to body habitus accounts for crowded bronchovascular markings, especially in the bases, and accentuates the cardiac silhouette. Taking this into account, cardiac silhouette upper normal in size and stable. Hilar and mediastinal contours otherwise unremarkable. Lungs clear. Bronchovascular markings normal. Pulmonary vascularity normal. No visible pleural effusions. No pneumothorax. Degenerative changes involving the  thoracic spine.  IMPRESSION: Suboptimal inspiration.  No acute cardiopulmonary disease.   Electronically Signed   By: Hulan Saashomas  Lawrence M.D.   On: 03/23/2014 12:09   Dg Knee Complete 4 Views Left  03/23/2014   CLINICAL DATA:  Status post 1st foot surgery 1 week ago; awakened last evening with severe pain in the left foot radiating into the left knee. History of septicemia  EXAM: LEFT KNEE - COMPLETE 4+ VIEW  COMPARISON:  None.  FINDINGS: There is a prosthetic left knee joint. Radiographic positioning of the prosthetic components is good. The interface with the native bone is normal. There is no joint effusion.  IMPRESSION: There is no acute abnormality of the prosthetic left knee joint or surrounding native bone.   Electronically Signed   By: David  SwazilandJordan   On: 03/23/2014 12:20   Dg Foot Complete Left  03/23/2014   CLINICAL DATA:  LEFT foot surgery 1 week ago, awoke last night with severe pain in LEFT foot radiating to LEFT knee and leg, past history of septicemia with RIGHT knee surgery  EXAM: LEFT FOOT - COMPLETE 3+ VIEW  COMPARISON:  04/24/2010; correlation MRI LEFT foot 06/10/2011  FINDINGS: Large cannulated screw present extending from the distal first metatarsal to the mid talus.  Multiple areas of intertarsal joint space narrowing and spur formation are identified.  Diffuse soft tissue swelling LEFT foot greatest at the plantar aspect.  Progressive degenerative changes at fifth TMT joint.  No acute fracture, dislocation or bone destruction.  No periprosthetic lucency.  Large plantar calcaneal spur.  IMPRESSION: Interval placement of a fusion screw from the distal first metatarsal to the mid talus.  Scattered degenerative changes in the midfoot as above.  Progressive degenerative changes at fifth TMT joint.  Extensive soft tissue swelling greatest at plantar aspect of midfoot without definite fracture or additional bone destruction.   Electronically Signed   By: Ulyses SouthwardMark  Boles M.D.   On: 03/23/2014 12:14      EKG Interpretation None      MDM   Final diagnoses:  Preop testing  Post-operative pain    BP 118/59 mmHg  Pulse 98  Temp(Src) 100.2 F (  37.9 C) (Oral)  Resp 18  SpO2 97%  LMP 03/06/2014  I have reviewed nursing notes and vital signs. I personally reviewed the imaging tests through PACS system  I reviewed available ER/hospitalization records thought the EMR     Fayrene HelperBowie Sylwia Cuervo, PA-C 03/23/14 1316  Lyanne CoKevin M Campos, MD 03/24/14 [phone]

## 2014-03-24 LAB — URINE CULTURE: Colony Count: 75000

## 2014-03-29 LAB — CULTURE, BLOOD (ROUTINE X 2)
CULTURE: NO GROWTH
Culture: NO GROWTH

## 2014-06-13 ENCOUNTER — Ambulatory Visit (INDEPENDENT_AMBULATORY_CARE_PROVIDER_SITE_OTHER): Payer: BLUE CROSS/BLUE SHIELD | Admitting: Infectious Diseases

## 2014-06-13 ENCOUNTER — Encounter: Payer: Self-pay | Admitting: Infectious Diseases

## 2014-06-13 VITALS — BP 129/82 | HR 105 | Temp 99.0°F | Ht 64.5 in | Wt 280.0 lb

## 2014-06-13 DIAGNOSIS — R739 Hyperglycemia, unspecified: Secondary | ICD-10-CM | POA: Diagnosis not present

## 2014-06-13 DIAGNOSIS — Z96659 Presence of unspecified artificial knee joint: Principal | ICD-10-CM

## 2014-06-13 DIAGNOSIS — T8459XD Infection and inflammatory reaction due to other internal joint prosthesis, subsequent encounter: Secondary | ICD-10-CM

## 2014-06-13 DIAGNOSIS — T8450XD Infection and inflammatory reaction due to unspecified internal joint prosthesis, subsequent encounter: Secondary | ICD-10-CM | POA: Diagnosis not present

## 2014-06-13 LAB — C-REACTIVE PROTEIN: CRP: 7.5 mg/dL — ABNORMAL HIGH (ref <0.60)

## 2014-06-13 MED ORDER — LEVOFLOXACIN 500 MG PO TABS
500.0000 mg | ORAL_TABLET | Freq: Every day | ORAL | Status: DC
Start: 2014-06-13 — End: 2015-01-04

## 2014-06-13 NOTE — Progress Notes (Signed)
   Subjective:    Patient ID: [name], female    DOB: 03-14-1966, 49 y.o.   MRN: [id number]  HPI 49 yo F with a history of type 2 diabetes, obesity, left total knee replacement 2007, right total knee replacement 2012. She came to [facility] on January 30, 2013 with two days of right knee pain, fever, sweats, and hot flashes. In the emergency room her temperature was 100.8. She had been seen previously in her orthopedist office and had an arthrocentesis. In hospital she was started on vancomycin. On October 7, she underwent irrigation and debridement of her right total knee replacement with exchange of parts. Cipro was added to her antibiotic regimen on October 8. She was found to have group B strep in her culture from her outpatient aspiration. On October 9 her antibiotics were changed to ceftriaxone 2 g once daily and she was discharged home. 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 CRP 31.7. ESR 113.  She was seen in ID f/u on 02-15-13 and was doing well. Repeat ESR 79 and CRP 6.3. ESR 34 and CRP 1.9 (03-14-13).  She was seen in ID f/u on 03-14-13 and her IV anbx were stopped, and she was started on levaquin.  At her f/u 49-17-15 she had ESR 23 and CRP 1.7.  Has had f/u with ortho. Saw [facility] as well (A1C has decreased to mid-5s). Has had no further success with wt loss. Has been less and les active due to pain in her feet.  CRP 1.3 and ESR 14 (July 2015).  Has seen Dr Sharol Given due to feet collapsing. Has been experimenting with inserts, planned for L foot surgery 03-17-14.  Was planned to stop her anbx at 1 year (Oct 2015) however at ID f/u on 11-17 was continued on anbx through her foot surgery.   After L foot surgery (5 bones removed, rod from great toe to ankle), she had fevers. She had 2nd anbx added. She had repeat aspirate of her L knee that was clear. She had Cx sent from her foot that  Was (-). She had no streaking on her leg.  Her foot pain has  resolved however she has significant ankle pain. She is using a walker.  Fever has resolved.  CRP 6.8 ESR 50 (03-23-14).  FSG "good", A1C- 4.7/5.2. Sees FP every 6 months.   Review of Systems  Constitutional: Negative for fever and chills.  Musculoskeletal: Positive for arthralgias.  Neurological: Positive for numbness.       Objective:   Physical Exam  Constitutional: She appears well-developed and well-nourished.  Eyes: EOM are normal. Pupils are equal, round, and reactive to light.  Neck: Neck supple.  Cardiovascular: Normal rate, regular rhythm and normal heart sounds.   Pulmonary/Chest: Effort normal and breath sounds normal.  Abdominal: Soft. Bowel sounds are normal. She exhibits no distension. There is no tenderness.  Musculoskeletal:       Feet:  Lymphadenopathy:    She has no cervical adenopathy.          Assessment & Plan:

## 2014-06-13 NOTE — Assessment & Plan Note (Signed)
Will continue her on levaquin in conjunction with her orthopedists recommendation.  Will repeat her ESR and CRP. Could consider MRI of her foot but will defer this to her foot surgeon.   Hopefully her foot issues are not related to flouroquinolone tendon issues.  Will see her back in 1 month.

## 2014-06-13 NOTE — Assessment & Plan Note (Signed)
She is seeing her PCP q 6 months. Appears to be doing well although has significant neuropathy (she states she has EMG and this is attributed to her back)

## 2014-06-14 LAB — SEDIMENTATION RATE: Sed Rate: 77 mm/hr — ABNORMAL HIGH (ref 0–20)

## 2014-07-09 ENCOUNTER — Telehealth: Payer: Self-pay | Admitting: *Deleted

## 2014-07-09 ENCOUNTER — Ambulatory Visit: Payer: BLUE CROSS/BLUE SHIELD | Admitting: Infectious Diseases

## 2014-07-09 NOTE — Telephone Encounter (Signed)
Patient tried to reschedule today's appointment, but was unable to speak with someone to do so.  Patient is non-weight bearing during this time. She would like to reschedule after the current issue has been stabilized. Per patient, she has been following up with Dr. Lajoyce Cornersuda over the last month.  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 Will get a series of xray at that time, then will be looking at ankle surgery.  Per patient, her pain is much better.  She was diagnosed with acute gout after her appointment at [facility] last month.  She completed colchicine for flare, is now on chronic allopurinol.  The boot has also stabilized her joint and reduced her pain.  She feels Dr. Lajoyce Cornersuda is on top of her foot issues at this time, will keep you updated with future plans of surgery.    Please advise if pt should continue the levaquin, especially considering her upcoming surgery.  She was given a one year refill at last visit.  Andree CossHowell, Calyn Sivils M, RN

## 2014-07-10 NOTE — Telephone Encounter (Signed)
Please refill  thanks

## 2014-08-01 ENCOUNTER — Other Ambulatory Visit (HOSPITAL_COMMUNITY): Payer: Self-pay | Admitting: Orthopedic Surgery

## 2014-08-02 ENCOUNTER — Telehealth: Payer: Self-pay | Admitting: *Deleted

## 2014-08-02 MED ORDER — CEFAZOLIN SODIUM 10 G IJ SOLR
3.0000 g | INTRAMUSCULAR | Status: AC
  Administered 2014-08-03: 3 g via INTRAVENOUS
  Filled 2014-08-02: qty 3000

## 2014-08-02 NOTE — Progress Notes (Signed)
Pt already made aware to arrive at 11:30AM

## 2014-08-02 NOTE — Telephone Encounter (Signed)
Patient wanted to update with Dr. Audrie Liauda's plan.  Patient is scheduled for ankle surgery tomorrow (reconstruction) 4/8 and will be staying on the levaquin.

## 2014-08-03 ENCOUNTER — Inpatient Hospital Stay (HOSPITAL_COMMUNITY): Payer: BLUE CROSS/BLUE SHIELD | Admitting: Anesthesiology

## 2014-08-03 ENCOUNTER — Encounter (HOSPITAL_COMMUNITY): Payer: Self-pay | Admitting: *Deleted

## 2014-08-03 ENCOUNTER — Ambulatory Visit (HOSPITAL_COMMUNITY)
Admission: RE | Admit: 2014-08-03 | Discharge: 2014-08-04 | Disposition: A | Discharge status: Home / Self Care | Payer: BLUE CROSS/BLUE SHIELD | Source: Ambulatory Visit | Attending: Orthopedic Surgery | Admitting: Orthopedic Surgery

## 2014-08-03 ENCOUNTER — Encounter (HOSPITAL_COMMUNITY)
Admission: RE | Disposition: A | Discharge status: Home / Self Care | Payer: Self-pay | Source: Ambulatory Visit | Attending: Orthopedic Surgery

## 2014-08-03 DIAGNOSIS — Z6830 Body mass index (BMI) 30.0-30.9, adult: Secondary | ICD-10-CM | POA: Insufficient documentation

## 2014-08-03 DIAGNOSIS — Z96652 Presence of left artificial knee joint: Secondary | ICD-10-CM | POA: Insufficient documentation

## 2014-08-03 DIAGNOSIS — E669 Obesity, unspecified: Secondary | ICD-10-CM | POA: Insufficient documentation

## 2014-08-03 DIAGNOSIS — Z823 Family history of stroke: Secondary | ICD-10-CM | POA: Diagnosis not present

## 2014-08-03 DIAGNOSIS — G629 Polyneuropathy, unspecified: Secondary | ICD-10-CM | POA: Insufficient documentation

## 2014-08-03 DIAGNOSIS — L97509 Non-pressure chronic ulcer of other part of unspecified foot with unspecified severity: Secondary | ICD-10-CM | POA: Diagnosis not present

## 2014-08-03 DIAGNOSIS — M14672 Charcot's joint, left ankle and foot: Secondary | ICD-10-CM | POA: Diagnosis not present

## 2014-08-03 HISTORY — PX: APPLICATION OF WOUND VAC: SHX5189

## 2014-08-03 HISTORY — PX: ANKLE FUSION: SHX5718

## 2014-08-03 LAB — CBC
HCT: 38.2 % (ref 36.0–46.0)
Hemoglobin: 11.9 g/dL — ABNORMAL LOW (ref 12.0–15.0)
MCH: 25.1 pg — ABNORMAL LOW (ref 26.0–34.0)
MCHC: 31.2 g/dL (ref 30.0–36.0)
MCV: 80.4 fL (ref 78.0–100.0)
PLATELETS: 264 10*3/uL (ref 150–400)
RBC: 4.75 MIL/uL (ref 3.87–5.11)
RDW: 18.7 % — AB (ref 11.5–15.5)
WBC: 8.4 10*3/uL (ref 4.0–10.5)

## 2014-08-03 LAB — COMPREHENSIVE METABOLIC PANEL
ALBUMIN: 3.6 g/dL (ref 3.5–5.2)
ALK PHOS: 78 U/L (ref 39–117)
ALT: 13 U/L (ref 0–35)
ANION GAP: 9 (ref 5–15)
AST: 18 U/L (ref 0–37)
BILIRUBIN TOTAL: 0.5 mg/dL (ref 0.3–1.2)
BUN: 9 mg/dL (ref 6–23)
CHLORIDE: 104 mmol/L (ref 96–112)
CO2: 25 mmol/L (ref 19–32)
CREATININE: 0.58 mg/dL (ref 0.50–1.10)
Calcium: 9.6 mg/dL (ref 8.4–10.5)
GFR calc Af Amer: >90 mL/min (ref >90)
Glucose, Bld: 117 mg/dL — ABNORMAL HIGH (ref 70–99)
POTASSIUM: 4.3 mmol/L (ref 3.5–5.1)
SODIUM: 138 mmol/L (ref 135–145)
Total Protein: 7.2 g/dL (ref 6.0–8.3)

## 2014-08-03 LAB — PROTIME-INR
INR: 0.99 (ref 0.00–1.49)
PROTHROMBIN TIME: 13.2 s (ref 11.6–15.2)

## 2014-08-03 LAB — HCG, SERUM, QUALITATIVE: Preg, Serum: NEGATIVE

## 2014-08-03 LAB — APTT: aPTT: 26 seconds (ref 24–37)

## 2014-08-03 SURGERY — ANKLE FUSION
Anesthesia: General | Site: Leg Lower | Laterality: Left

## 2014-08-03 MED ORDER — FENTANYL CITRATE 0.05 MG/ML IJ SOLN
INTRAMUSCULAR | Status: AC
  Filled 2014-08-03: qty 5

## 2014-08-03 MED ORDER — LIDOCAINE HCL (CARDIAC) 20 MG/ML IV SOLN
INTRAVENOUS | Status: AC
  Filled 2014-08-03: qty 30

## 2014-08-03 MED ORDER — FENTANYL CITRATE 0.05 MG/ML IJ SOLN
INTRAMUSCULAR | Status: DC | PRN
  Administered 2014-08-03: 50 ug via INTRAVENOUS
  Administered 2014-08-03 (×2): 25 ug via INTRAVENOUS

## 2014-08-03 MED ORDER — SODIUM CHLORIDE 0.9 % IV SOLN
INTRAVENOUS | Status: DC
  Administered 2014-08-03: 10 mL/h via INTRAVENOUS

## 2014-08-03 MED ORDER — MIDAZOLAM HCL 2 MG/2ML IJ SOLN
1.0000 mg | INTRAMUSCULAR | Status: DC | PRN
  Administered 2014-08-03: 2 mg via INTRAVENOUS

## 2014-08-03 MED ORDER — MIDAZOLAM HCL 2 MG/2ML IJ SOLN
INTRAMUSCULAR | Status: AC
  Filled 2014-08-03: qty 2

## 2014-08-03 MED ORDER — METOCLOPRAMIDE HCL 5 MG PO TABS: 5.0000 mg | ORAL_TABLET | Freq: Three times a day (TID) | ORAL | Status: DC | PRN

## 2014-08-03 MED ORDER — HYDROMORPHONE HCL 1 MG/ML IJ SOLN
INTRAMUSCULAR | Status: AC
  Filled 2014-08-03: qty 1

## 2014-08-03 MED ORDER — METHOCARBAMOL 500 MG PO TABS
500.0000 mg | ORAL_TABLET | Freq: Four times a day (QID) | ORAL | Status: DC | PRN
  Filled 2014-08-03: qty 1

## 2014-08-03 MED ORDER — ONDANSETRON HCL 4 MG/2ML IJ SOLN
INTRAMUSCULAR | Status: AC
Start: 2014-08-03 — End: 2014-08-03
  Filled 2014-08-03: qty 8

## 2014-08-03 MED ORDER — METOCLOPRAMIDE HCL 5 MG/ML IJ SOLN: 5.0000 mg | Freq: Three times a day (TID) | INTRAMUSCULAR | Status: DC | PRN

## 2014-08-03 MED ORDER — ARTIFICIAL TEARS OP OINT
TOPICAL_OINTMENT | OPHTHALMIC | Status: AC
Start: 2014-08-03 — End: 2014-08-03
  Filled 2014-08-03: qty 14

## 2014-08-03 MED ORDER — PROPOFOL 10 MG/ML IV BOLUS
INTRAVENOUS | Status: AC
  Filled 2014-08-03: qty 20

## 2014-08-03 MED ORDER — ACETAMINOPHEN 325 MG PO TABS: 650.0000 mg | ORAL_TABLET | Freq: Four times a day (QID) | ORAL | Status: DC | PRN

## 2014-08-03 MED ORDER — ALLOPURINOL 100 MG PO TABS
100.0000 mg | ORAL_TABLET | Freq: Two times a day (BID) | ORAL | Status: DC
  Administered 2014-08-03 – 2014-08-04 (×2): 100 mg via ORAL
  Filled 2014-08-03 (×2): qty 1

## 2014-08-03 MED ORDER — LIDOCAINE HCL (CARDIAC) 20 MG/ML IV SOLN
INTRAVENOUS | Status: DC | PRN
  Administered 2014-08-03: 50 mg via INTRAVENOUS

## 2014-08-03 MED ORDER — ONDANSETRON HCL 4 MG/2ML IJ SOLN: 4.0000 mg | Freq: Four times a day (QID) | INTRAMUSCULAR | Status: DC | PRN

## 2014-08-03 MED ORDER — SODIUM CHLORIDE 0.9 % IJ SOLN
INTRAMUSCULAR | Status: AC
  Filled 2014-08-03: qty 20

## 2014-08-03 MED ORDER — PHENYLEPHRINE 40 MCG/ML (10ML) SYRINGE FOR IV PUSH (FOR BLOOD PRESSURE SUPPORT)
PREFILLED_SYRINGE | INTRAVENOUS | Status: AC
  Filled 2014-08-03: qty 10

## 2014-08-03 MED ORDER — OXYCODONE HCL 5 MG PO TABS
5.0000 mg | ORAL_TABLET | ORAL | Status: DC | PRN
  Administered 2014-08-03 – 2014-08-04 (×6): 10 mg via ORAL
  Filled 2014-08-03 (×6): qty 2

## 2014-08-03 MED ORDER — FENTANYL CITRATE 0.05 MG/ML IJ SOLN
50.0000 ug | INTRAMUSCULAR | Status: DC | PRN
  Administered 2014-08-03: 100 ug via INTRAVENOUS

## 2014-08-03 MED ORDER — ROCURONIUM BROMIDE 50 MG/5ML IV SOLN
INTRAVENOUS | Status: AC
Start: 2014-08-03 — End: 2014-08-03
  Filled 2014-08-03: qty 1

## 2014-08-03 MED ORDER — HYDROMORPHONE HCL 1 MG/ML IJ SOLN
1.0000 mg | INTRAMUSCULAR | Status: DC | PRN
  Administered 2014-08-03 – 2014-08-04 (×5): 1 mg via INTRAVENOUS
  Filled 2014-08-03 (×4): qty 1

## 2014-08-03 MED ORDER — GLYCOPYRROLATE 0.2 MG/ML IJ SOLN
INTRAMUSCULAR | Status: AC
  Filled 2014-08-03: qty 1

## 2014-08-03 MED ORDER — LACTATED RINGERS IV SOLN
INTRAVENOUS | Status: DC
  Administered 2014-08-03: 13:00:00 via INTRAVENOUS

## 2014-08-03 MED ORDER — ONDANSETRON HCL 4 MG/2ML IJ SOLN
INTRAMUSCULAR | Status: DC | PRN
  Administered 2014-08-03: 4 mg via INTRAVENOUS

## 2014-08-03 MED ORDER — EPHEDRINE SULFATE 50 MG/ML IJ SOLN
INTRAMUSCULAR | Status: AC
  Filled 2014-08-03: qty 2

## 2014-08-03 MED ORDER — CEFAZOLIN SODIUM-DEXTROSE 2-3 GM-% IV SOLR
2.0000 g | Freq: Four times a day (QID) | INTRAVENOUS | Status: AC
  Administered 2014-08-03 – 2014-08-04 (×3): 2 g via INTRAVENOUS
  Filled 2014-08-03 (×5): qty 50

## 2014-08-03 MED ORDER — CEFAZOLIN SODIUM-DEXTROSE 2-3 GM-% IV SOLR
INTRAVENOUS | Status: AC
  Filled 2014-08-03: qty 50

## 2014-08-03 MED ORDER — MIDAZOLAM HCL 2 MG/2ML IJ SOLN
INTRAMUSCULAR | Status: AC
  Administered 2014-08-03: 2 mg via INTRAVENOUS
  Filled 2014-08-03: qty 2

## 2014-08-03 MED ORDER — FENTANYL CITRATE 0.05 MG/ML IJ SOLN
INTRAMUSCULAR | Status: AC
  Administered 2014-08-03: 100 ug via INTRAVENOUS
  Filled 2014-08-03: qty 2

## 2014-08-03 MED ORDER — MIDAZOLAM HCL 5 MG/5ML IJ SOLN
INTRAMUSCULAR | Status: DC | PRN
  Administered 2014-08-03: 2 mg via INTRAVENOUS

## 2014-08-03 MED ORDER — PROPOFOL 10 MG/ML IV BOLUS
INTRAVENOUS | Status: DC | PRN
  Administered 2014-08-03: 100 mg via INTRAVENOUS
  Administered 2014-08-03: 200 mg via INTRAVENOUS

## 2014-08-03 MED ORDER — LACTATED RINGERS IV SOLN
INTRAVENOUS | Status: DC | PRN
  Administered 2014-08-03: 13:00:00 via INTRAVENOUS

## 2014-08-03 MED ORDER — ONDANSETRON HCL 4 MG PO TABS: 4.0000 mg | ORAL_TABLET | Freq: Four times a day (QID) | ORAL | Status: DC | PRN

## 2014-08-03 MED ORDER — SUCCINYLCHOLINE CHLORIDE 20 MG/ML IJ SOLN
INTRAMUSCULAR | Status: AC
  Filled 2014-08-03: qty 3

## 2014-08-03 MED ORDER — METHOCARBAMOL 1000 MG/10ML IJ SOLN
500.0000 mg | Freq: Four times a day (QID) | INTRAVENOUS | Status: DC | PRN
  Filled 2014-08-03: qty 5

## 2014-08-03 MED ORDER — ACETAMINOPHEN 650 MG RE SUPP: 650.0000 mg | Freq: Four times a day (QID) | RECTAL | Status: DC | PRN

## 2014-08-03 MED ORDER — ONDANSETRON HCL 4 MG/2ML IJ SOLN
INTRAMUSCULAR | Status: AC
  Filled 2014-08-03: qty 2

## 2014-08-03 SURGICAL SUPPLY — 43 items
BANDAGE ESMARK 6X9 LF (GAUZE/BANDAGES/DRESSINGS) ×2 IMPLANT
BLADE SAW SGTL HD 18.5X60.5X1. (BLADE) ×4 IMPLANT
BLADE SURG 10 STRL SS (BLADE) IMPLANT
BNDG COHESIVE 4X5 TAN STRL (GAUZE/BANDAGES/DRESSINGS) ×4 IMPLANT
BNDG ESMARK 6X9 LF (GAUZE/BANDAGES/DRESSINGS) ×4
BNDG GAUZE ELAST 4 BULKY (GAUZE/BANDAGES/DRESSINGS) ×8 IMPLANT
CAP END T25 HF ARTHRO NAIL EX (Cap) ×2 IMPLANT
COVER MAYO STAND STRL (DRAPES) IMPLANT
COVER SURGICAL LIGHT HANDLE (MISCELLANEOUS) ×8 IMPLANT
DRAPE INCISE IOBAN 66X45 STRL (DRAPES) ×4 IMPLANT
DRAPE OEC MINIVIEW 54X84 (DRAPES) IMPLANT
DRAPE U-SHAPE 47X51 STRL (DRAPES) ×4 IMPLANT
DRSG ADAPTIC 3X8 NADH LF (GAUZE/BANDAGES/DRESSINGS) ×4 IMPLANT
DRSG MEPILEX BORDER 4X8 (GAUZE/BANDAGES/DRESSINGS) ×4 IMPLANT
DURAPREP 26ML APPLICATOR (WOUND CARE) ×4 IMPLANT
ELECT REM PT RETURN 9FT ADLT (ELECTROSURGICAL) ×4
ELECTRODE REM PT RTRN 9FT ADLT (ELECTROSURGICAL) ×2 IMPLANT
ENDCAP T25 HF ARTHRO NAIL EX (Cap) ×2 IMPLANT
GAUZE SPONGE 4X4 12PLY STRL (GAUZE/BANDAGES/DRESSINGS) ×4 IMPLANT
GLOVE BIOGEL PI IND STRL 9 (GLOVE) ×2 IMPLANT
GLOVE BIOGEL PI INDICATOR 9 (GLOVE) ×2
GLOVE SURG ORTHO 9.0 STRL STRW (GLOVE) ×4 IMPLANT
GOWN STRL REUS W/ TWL XL LVL3 (GOWN DISPOSABLE) ×6 IMPLANT
GOWN STRL REUS W/TWL XL LVL3 (GOWN DISPOSABLE) ×6
KIT BASIN OR (CUSTOM PROCEDURE TRAY) ×4 IMPLANT
KIT ROOM TURNOVER OR (KITS) ×4 IMPLANT
NAIL HINDFOOT TI 10X150MM (Nail) ×4 IMPLANT
NS IRRIG 1000ML POUR BTL (IV SOLUTION) ×4 IMPLANT
PACK ORTHO EXTREMITY (CUSTOM PROCEDURE TRAY) ×4 IMPLANT
PAD ARMBOARD 7.5X6 YLW CONV (MISCELLANEOUS) ×8 IMPLANT
PAD NEG PRESSURE SENSATRAC (MISCELLANEOUS) ×4 IMPLANT
REAMER ROD DEEP FLUTE 2.5X950 (INSTRUMENTS) ×4 IMPLANT
SCREW LOCK STAR 6X80 (Screw) ×4 IMPLANT
SCREW LOCKING 5.0X28MM (Screw) ×4 IMPLANT
SCREW LOCKING 6.0X110 (Screw) ×4 IMPLANT
SPONGE LAP 18X18 X RAY DECT (DISPOSABLE) ×4 IMPLANT
SUCTION FRAZIER TIP 10 FR DISP (SUCTIONS) ×4 IMPLANT
SUT ETHILON 2 0 PSLX (SUTURE) ×12 IMPLANT
TOWEL OR 17X24 6PK STRL BLUE (TOWEL DISPOSABLE) ×4 IMPLANT
TOWEL OR 17X26 10 PK STRL BLUE (TOWEL DISPOSABLE) ×4 IMPLANT
TUBE CONNECTING 12'X1/4 (SUCTIONS) ×1
TUBE CONNECTING 12X1/4 (SUCTIONS) ×3 IMPLANT
WATER STERILE IRR 1000ML POUR (IV SOLUTION) ×4 IMPLANT

## 2014-08-03 NOTE — Op Note (Signed)
08/03/2014  3:00 PM  PATIENT:  Malyia R Wehmeyer    PRE-OPERATIVE DIAGNOSIS:  Left Charoct Ankle  POST-OPERATIVE DIAGNOSIS:  Same  PROCEDURE:  Left Tibiocalcaneal Fusion Application of incisional wound VAC.  SURGEON:  Nadara MustardUDA,Perris Conwell V, MD  PHYSICIAN ASSISTANT:None ANESTHESIA:   General  PREOPERATIVE INDICATIONS:  Gordy Councilmanlexandra R Liberto is a  49 y.o. female with a diagnosis of Left Charoct Ankle who failed conservative measures and elected for surgical management.    The risks benefits and alternatives were discussed with the patient preoperatively including but not limited to the risks of infection, bleeding, nerve injury, cardiopulmonary complications, the need for revision surgery, among others, and the patient was willing to proceed.  OPERATIVE IMPLANTS: Synthes tibial calcaneal nail 10 x 150 mm  OPERATIVE FINDINGS: Charcot collapse of the left ankle  OPERATIVE PROCEDURE: Patient was brought to the operating room and underwent a general anesthetic. After adequate levels of anesthesia were obtained patient was placed in the right lateral decubitus position with the left side up and the left lower extremity was prepped using DuraPrep draped into a sterile field. A timeout was called. A lateral incision was made in the distal aspect of the fibula was resected. The tibia and talar joint was then resected perpendicular to the long axis of the tibia. This was reduced. The wound was irrigated throughout the case hemostasis was obtained with electrocautery. A plantar incision was made and the guidewire was inserted plantarly from the calcaneus to the talus to the tibia. C-arm fluoroscopy verified alignment. This was then overdrilled and then reamed up to 11 mm for a 10 mm nail. The nail was inserted and 2 screws were inserted posteriorly to the calcaneus into the hindfoot and a locking screw was placed to secure these in place. A lateral's locking screw was then placed proximally after the fusion site was  impacted and stabilized with a 28 mm screw. C-arm floss be verified alignment. The wound was irrigated with normal saline. The incisions were closed using 2-0 nylon. An incisional VAC was applied over the fibula. This had good suction fit. Patient was extubated taken to the PACU in stable condition.

## 2014-08-03 NOTE — Transfer of Care (Signed)
Immediate Anesthesia Transfer of Care Note  Patient: [name]  Procedure(s) Performed: Procedure(s): Left Tibiocalcaneal Fusion (Left)  Patient Location: PACU  Anesthesia Type:General  Level of Consciousness: awake, alert , oriented and patient cooperative  Airway & Oxygen Therapy: Patient Spontanous Breathing  Post-op Assessment: Report given to RN and Post -op Vital signs reviewed and stable  Post vital signs: Reviewed and stable  Last Vitals:  Filed Vitals:   08/03/14 1500  BP: 157/78  Pulse: 98  Temp: 36.9 C  Resp: 12    Complications: No apparent anesthesia complications

## 2014-08-03 NOTE — Anesthesia Preprocedure Evaluation (Addendum)
Anesthesia Evaluation  Patient identified by MRN, date of birth, ID band Patient awake    Reviewed: Allergy & Precautions, NPO status , Patient's Chart, lab work & pertinent test results  Airway Mallampati: II  TM Distance: <3 FB Neck ROM: Full    Dental no notable dental hx.    Pulmonary sleep apnea and Continuous Positive Airway Pressure Ventilation ,  breath sounds clear to auscultation  Pulmonary exam normal       Cardiovascular negative cardio ROS  Rhythm:Regular Rate:Normal     Neuro/Psych negative neurological ROS  negative psych ROS   GI/Hepatic negative GI ROS, Neg liver ROS,   Endo/Other  negative endocrine ROS  Renal/GU negative Renal ROS  negative genitourinary   Musculoskeletal negative musculoskeletal ROS (+)   Abdominal   Peds negative pediatric ROS (+)  Hematology negative hematology ROS (+)   Anesthesia Other Findings   Reproductive/Obstetrics negative OB ROS                            Anesthesia Physical Anesthesia Plan  ASA: III  Anesthesia Plan: General   Post-op Pain Management:    Induction: Intravenous  Airway Management Planned: LMA  Additional Equipment:   Intra-op Plan:   Post-operative Plan: Extubation in OR  Informed Consent: I have reviewed the patients History and Physical, chart, labs and discussed the procedure including the risks, benefits and alternatives for the proposed anesthesia with the patient or authorized representative who has indicated his/her understanding and acceptance.   Dental advisory given  Plan Discussed with: CRNA and Surgeon  Anesthesia Plan Comments:         Anesthesia Quick Evaluation

## 2014-08-03 NOTE — H&P (Signed)
[name] is an 49 y.o. female.   Chief Complaint: Charcot collapse left ankle HPI: Patient is a 49 year old woman who presents after conservative treatment for Charcot collapse of the left ankle.  Past Medical History  Diagnosis Date  . Pneumonia     hx 20+ years ago  . Headache(784.0)     hormonal headache  . Arthritis   . Neuropathy     bilateral feet  . Ulcer of foot     toe ulcer greater than a year ago  . Obese   . Sleep apnea     bipap 1 yr    Past Surgical History  Procedure Laterality Date  . Knee arthroscopy      left knee  . Replacement total knee      left  . Total knee arthroplasty  06/23/2011    Procedure: TOTAL KNEE ARTHROPLASTY;  Surgeon: Valeria BatmanPeter W Whitfield, MD;  [facility];  Service: Orthopedics;  Laterality: Right;  RIGHT TOTAL KNEE REPLACEMENT   . Incision and drainage Right 01/31/2013    ANTIBIOTIC SPACER  RIGHT KNEE  DR Cleophas DunkerWHITFIELD   . I&d knee with poly exchange Right 01/31/2013    Procedure: IRRIGATION AND DEBRIDEMENT KNEE WITH POLY EXCHANGE possible Antibiotic Spacer;  Surgeon: Valeria BatmanPeter W Whitfield, MD;  Location: [facility] OR;  Service: Orthopedics;  Laterality: Right;  . Orif ankle fracture Left 03/16/2014    Procedure: Foot Excision Charcot Collapse,  Internal Fixation;  Surgeon: Nadara MustardMarcus Sreshta Cressler V, MD;  Location: [facility] OR;  Service: Orthopedics;  Laterality: Left;    Family History  Problem Relation Age of Onset  . Anesthesia problems Father   . Stroke Father   . Hypotension Neg Hx   . Malignant hyperthermia Neg Hx   . Pseudochol deficiency Neg Hx    Social History:  reports that she has never smoked. She has never used smokeless tobacco. She reports that she drinks alcohol. She reports that she does not use illicit drugs.  Allergies: No Known Allergies  No prescriptions prior to admission    No results found for this or any previous visit (from the past 48 hour(s)). No results found.  Review of Systems  All other systems reviewed and are  negative.   There were no vitals taken for this visit. Physical Exam  On examination patient has progressive collapse of the left ankle. She has palpable pulses.  Assessment/Plan Assessment: Charcot collapse left ankle.  Plan: We'll plan for left tibial calcaneal fusion. Risks and benefits were discussed including infection neurovascular injury failure fixation need for additional surgery. Patient states she understands and wishes to proceed at this time.  [name] V 08/03/2014, 6:00 AM

## 2014-08-03 NOTE — Progress Notes (Signed)
Orthopedic Tech Progress Note Patient Details:  Garald Braverlexandra R Paccione 02/01/1966 [id number] Patient already has own cam walker. Patient ID: Garald Braverlexandra R Lackey, female   DOB: 12/05/1965, 49 y.o.   MRN: [id number]   Jennye MoccasinHughes, Lauraann Missey Craig 08/03/2014, 5:39 PM

## 2014-08-03 NOTE — Anesthesia Postprocedure Evaluation (Signed)
  Anesthesia Post-op Note  Patient: [name]  Procedure(s) Performed: Procedure(s) (LRB): Left Tibiocalcaneal Fusion (Left)  Patient Location: PACU  Anesthesia Type: GA combined with regional for post-op pain  Level of Consciousness: awake and alert   Airway and Oxygen Therapy: Patient Spontanous Breathing  Post-op Pain: mild  Post-op Assessment: Post-op Vital signs reviewed, Patient's Cardiovascular Status Stable, Respiratory Function Stable, Patent Airway and No signs of Nausea or vomiting  Last Vitals:  Filed Vitals:   08/03/14 1533  BP: 147/72  Pulse: 90  Temp:   Resp: 12    Post-op Vital Signs: stable   Complications: No apparent anesthesia complications

## 2014-08-04 DIAGNOSIS — M14672 Charcot's joint, left ankle and foot: Secondary | ICD-10-CM | POA: Diagnosis not present

## 2014-08-04 MED ORDER — OXYCODONE-ACETAMINOPHEN 5-325 MG PO TABS: 1.0000 | ORAL_TABLET | ORAL | Status: DC | PRN

## 2014-08-04 NOTE — Progress Notes (Signed)
Discharge instructions and prescriptions reviewed with patient; denies questions or concerns at this time. IV removed with no complications. VSS. Wheelchair for home use delivered to room prior to d/c, and patient already has rolling walker for use. Patient discharged via wheelchair with all personal belongings, prescriptions, and discharge packet.

## 2014-08-04 NOTE — Evaluation (Signed)
Physical Therapy Evaluation Patient Details Name: [name] LEMMONS MRN: [id number] DOB: 03/29/1966 Today's Date: 08/04/2014   History of Present Illness  Patient is a 49 yo married female with calcaneal fusion secondary to Charcot foot collapse  Clinical Impression  Patient familiar with how to manage mobility secondary to prior Lt foot surgery. Patient has all necessary equipment and has assistance available at home.  Patient may need therapy once able to incr WB status.  Patient ready for discharge home with family.    Follow Up Recommendations No PT follow up;Supervision/Assistance - 24 hour    Equipment Recommendations  Other (comment)    Recommendations for Other Services       Precautions / Restrictions Precautions Precautions: Fall (decr sensation stocking pattern to proximal tibias bil) Precaution Comments: has wound vac left ankle/foot Restrictions Weight Bearing Restrictions: Yes LLE Weight Bearing: Touchdown weight bearing Other Position/Activity Restrictions: Rt foot deformity      Mobility  Bed Mobility Overal bed mobility: Modified Independent             General bed mobility comments: incr time for getting in/out of bed  Transfers Overall transfer level: Needs assistance Equipment used: Rolling walker (2 wheeled) Transfers: Sit to/from Stand Sit to Stand: Supervision         General transfer comment: good safety during transfer  Ambulation/Gait Ambulation/Gait assistance: Supervision Ambulation Distance (Feet): 15 Feet Assistive device: Rolling walker (2 wheeled) Gait Pattern/deviations: Step-to pattern     General Gait Details: able to maintain TDWB status during mobility  Stairs            Wheelchair Mobility    Modified Rankin (Stroke Patients Only)       Balance Overall balance assessment: Needs assistance Sitting-balance support: No upper extremity supported Sitting balance-Leahy Scale: Good     Standing balance  support: Bilateral upper extremity supported Standing balance-Leahy Scale: Poor Standing balance comment: reliance on RW                             Pertinent Vitals/Pain Pain Assessment: 0-10 Pain Score: 5  Pain Location: left foot, lateral aspect and heel Pain Descriptors / Indicators: Sore;Throbbing Pain Intervention(s): Limited activity within patient's tolerance;Monitored during session    Home Living Family/patient expects to be discharged to:: Private residence Living Arrangements: Spouse/significant other Available Help at Discharge: Family;Available 24 hours/day Type of Home: House Home Access: Stairs to enter Entrance Stairs-Rails: None Entrance Stairs-Number of Steps: 1 Home Layout: One level Home Equipment: Wheelchair - [employer] - 2 wheels;Bedside commode;Shower seat      Prior Function Level of Independence: Independent               Hand Dominance   Dominant Hand: Right    Extremity/Trunk Assessment   Upper Extremity Assessment: Overall WFL for tasks assessed           Lower Extremity Assessment: Overall WFL for tasks assessed      Cervical / Trunk Assessment: Normal  Communication   Communication: No difficulties  Cognition Arousal/Alertness: Awake/alert Behavior During Therapy: WFL for tasks assessed/performed Overall Cognitive Status: Within Functional Limits for tasks assessed                      General Comments General comments (skin integrity, edema, etc.): wound vac left foot-pt able to independently manage    Exercises        Assessment/Plan    PT  Assessment Patent does not need any further PT services  PT Diagnosis Difficulty walking;Acute pain   PT Problem List    PT Treatment Interventions     PT Goals (Current goals can be found in the Care Plan section) Acute Rehab PT Goals Patient Stated Goal: return home as soon as able PT Goal Formulation: With patient/family Time For Goal Achievement:  08/06/14 Potential to Achieve Goals: Good    Frequency     Barriers to discharge        Co-evaluation               End of Session Equipment Utilized During Treatment: Gait belt Activity Tolerance: Patient tolerated treatment well Patient left: in bed;with call bell/phone within reach;with family/visitor present (pt sitting EOB in order to prepare to dress self) Nurse Communication: Mobility status    Functional Assessment Tool Used: mobility Functional Limitation: Mobility: Walking and moving around Mobility: Walking and Moving Around Current Status ([id number](G8978): At least 40 percent but less than 60 percent impaired, limited or restricted Mobility: Walking and Moving Around Goal Status [phone](G8979): At least 40 percent but less than 60 percent impaired, limited or restricted Mobility: Walking and Moving Around Discharge Status [phone]([id number]): At least 40 percent but less than 60 percent impaired, limited or restricted    Time: 1052-1118 PT Time Calculation (min) (ACUTE ONLY): 26 min   Charges:   PT Evaluation $Initial PT Evaluation Tier I: 1 Procedure     PT G Codes:   PT G-Codes **NOT FOR INPATIENT CLASS** Functional Assessment Tool Used: mobility Functional Limitation: Mobility: Walking and moving around Mobility: Walking and Moving Around Current Status ([id number](G8978): At least 40 percent but less than 60 percent impaired, limited or restricted Mobility: Walking and Moving Around Goal Status [phone]([id number]): At least 40 percent but less than 60 percent impaired, limited or restricted Mobility: Walking and Moving Around Discharge Status [phone]([id number]): At least 40 percent but less than 60 percent impaired, limited or restricted   Nestor LewandowskyKristen M Manahil Vanzile, [place]PT [phone] Dyllen Menning 08/04/2014, 6:09 PM

## 2014-08-04 NOTE — Discharge Summary (Signed)
  Discharge to home in stable condition. Surgical procedure tibial calcaneal fusion for Charcot collapse ankle. Follow-up in the office in 2 weeks. Prescription for Percocet. Hospital course essentially unremarkable.

## 2014-08-06 NOTE — Care Management Note (Signed)
CARE MANAGEMENT NOTE 08/06/2014  Patient:  [name],[name]   Account Number:  [id number]  Date Initiated:  08/06/2014  Documentation initiated by:  Vance PeperBRADY,Tamberlyn Midgley  Subjective/Objective Assessment:   49 yr old female admitted with left charcot ankle. patient underwent a Left tibial fusion.     Action/Plan:   Patient had no HH or DME needs at discharge.   Anticipated DC Date:  08/04/2014   Anticipated DC Plan:  HOME/SELF CARE      DC Planning Services  CM consult      Choice offered to / List presented to:        [facility] agency  NA        [facility]   Status of service:  Completed, signed off Medicare Important Message given?   (If response is "NO", the following Medicare IM given date fields will be blank) Date Medicare IM given:   Medicare IM given by:   Date Additional Medicare IM given:   Additional Medicare IM given by:    Discharge Disposition:  HOME/SELF CARE  Per UR Regulation:

## 2014-08-07 ENCOUNTER — Encounter (HOSPITAL_COMMUNITY): Payer: Self-pay | Admitting: Orthopedic Surgery

## 2014-12-06 DIAGNOSIS — M21969 Unspecified acquired deformity of unspecified lower leg: Secondary | ICD-10-CM | POA: Insufficient documentation

## 2014-12-19 ENCOUNTER — Telehealth: Payer: Self-pay | Admitting: *Deleted

## 2014-12-19 NOTE — Telephone Encounter (Signed)
Patient calling with update to her case.  She is now going to [facility] for reevaluation, will be there 9/8.  Will have CT and consult to decide if will do ankle replacement, reconstruction, fusion, amputation... ?  Really unsure her course of treatment.  Dr. Remer Macho, [facility] surgeon, is concerned about long-term use of levaquin and side effects on the musculature, especially with a potential reconstruction.  She is in-between surgeries, would like to know if there is an alternative antibiotic that she should be on or if she should stop prophylactic antibiotics all together.   Patient is on levaquin prophylactically 3 years because of her multiple surgical interventions (started septicemia R knee -> L Charcot foot/ankle).  Please advise. Andree Coss, RN

## 2015-01-03 NOTE — Telephone Encounter (Signed)
Surgery is scheduled for 9/30.  They will remove all old hardware, straighten her ankle, and place new hardware in.  She needs advice about continuing antibiotics. Please advise.

## 2015-01-04 ENCOUNTER — Other Ambulatory Visit: Payer: Self-pay | Admitting: *Deleted

## 2015-01-04 NOTE — Telephone Encounter (Signed)
She could change to augmentin  bid until surgery.  Her surgical team can then decide on her anbx

## 2015-01-04 NOTE — Telephone Encounter (Signed)
Relayed information to patient. Levaquin discontinued. Please advise strength of Augmentin for rx.

## 2015-01-08 ENCOUNTER — Other Ambulatory Visit: Payer: Self-pay | Admitting: *Deleted

## 2015-01-08 DIAGNOSIS — T8453XS Infection and inflammatory reaction due to internal right knee prosthesis, sequela: Secondary | ICD-10-CM

## 2015-01-08 MED ORDER — AMOXICILLIN-POT CLAVULANATE 500-125 MG PO TABS: 1.0000 | ORAL_TABLET | Freq: Two times a day (BID) | ORAL | Status: DC

## 2015-02-15 ENCOUNTER — Telehealth: Payer: Self-pay | Admitting: *Deleted

## 2015-02-15 NOTE — Telephone Encounter (Signed)
Patient called to let Dr Ninetta LightsHatcher know that she just had her 3rd surgery and [facility]. She also want to let him know that Dr Remer MachoEasley stopped all antibiotic therapy and they are going to wait and see what happens now. Advised her will send a message and if she needs us to call the office.

## 2015-07-31 ENCOUNTER — Ambulatory Visit
Admission: RE | Admit: 2015-07-31 | Discharge: 2015-07-31 | Disposition: A | Discharge status: Home / Self Care | Payer: BLUE CROSS/BLUE SHIELD | Source: Ambulatory Visit | Attending: Obstetrics & Gynecology | Admitting: Obstetrics & Gynecology

## 2015-07-31 ENCOUNTER — Other Ambulatory Visit: Payer: Self-pay | Admitting: Obstetrics & Gynecology

## 2015-07-31 DIAGNOSIS — Z975 Presence of (intrauterine) contraceptive device: Secondary | ICD-10-CM

## 2016-01-13 DIAGNOSIS — S91309A Unspecified open wound, unspecified foot, initial encounter: Secondary | ICD-10-CM | POA: Insufficient documentation

## 2016-01-15 ENCOUNTER — Encounter (HOSPITAL_BASED_OUTPATIENT_CLINIC_OR_DEPARTMENT_OTHER): Payer: Self-pay

## 2016-01-15 ENCOUNTER — Encounter (HOSPITAL_BASED_OUTPATIENT_CLINIC_OR_DEPARTMENT_OTHER): Payer: BLUE CROSS/BLUE SHIELD | Attending: Surgery

## 2016-01-15 DIAGNOSIS — L84 Corns and callosities: Secondary | ICD-10-CM | POA: Diagnosis not present

## 2016-01-15 DIAGNOSIS — M1990 Unspecified osteoarthritis, unspecified site: Secondary | ICD-10-CM | POA: Diagnosis not present

## 2016-01-15 DIAGNOSIS — Z96653 Presence of artificial knee joint, bilateral: Secondary | ICD-10-CM | POA: Diagnosis not present

## 2016-01-15 DIAGNOSIS — E11610 Type 2 diabetes mellitus with diabetic neuropathic arthropathy: Secondary | ICD-10-CM | POA: Diagnosis not present

## 2016-01-15 DIAGNOSIS — G4730 Sleep apnea, unspecified: Secondary | ICD-10-CM | POA: Insufficient documentation

## 2016-01-15 DIAGNOSIS — E11621 Type 2 diabetes mellitus with foot ulcer: Secondary | ICD-10-CM | POA: Diagnosis not present

## 2016-01-15 DIAGNOSIS — L97521 Non-pressure chronic ulcer of other part of left foot limited to breakdown of skin: Secondary | ICD-10-CM | POA: Insufficient documentation

## 2016-01-15 DIAGNOSIS — L89893 Pressure ulcer of other site, stage 3: Secondary | ICD-10-CM | POA: Diagnosis not present

## 2016-01-15 DIAGNOSIS — Z6841 Body Mass Index (BMI) 40.0 and over, adult: Secondary | ICD-10-CM | POA: Insufficient documentation

## 2016-01-15 DIAGNOSIS — E1140 Type 2 diabetes mellitus with diabetic neuropathy, unspecified: Secondary | ICD-10-CM | POA: Insufficient documentation

## 2016-01-22 DIAGNOSIS — L89893 Pressure ulcer of other site, stage 3: Secondary | ICD-10-CM | POA: Diagnosis not present

## 2016-01-29 ENCOUNTER — Encounter (HOSPITAL_BASED_OUTPATIENT_CLINIC_OR_DEPARTMENT_OTHER): Payer: BLUE CROSS/BLUE SHIELD | Attending: Surgery

## 2016-01-29 DIAGNOSIS — M1990 Unspecified osteoarthritis, unspecified site: Secondary | ICD-10-CM | POA: Diagnosis not present

## 2016-01-29 DIAGNOSIS — E11621 Type 2 diabetes mellitus with foot ulcer: Secondary | ICD-10-CM | POA: Diagnosis not present

## 2016-01-29 DIAGNOSIS — L97421 Non-pressure chronic ulcer of left heel and midfoot limited to breakdown of skin: Secondary | ICD-10-CM | POA: Diagnosis not present

## 2016-01-29 DIAGNOSIS — E1140 Type 2 diabetes mellitus with diabetic neuropathy, unspecified: Secondary | ICD-10-CM | POA: Diagnosis not present

## 2016-01-29 DIAGNOSIS — G4730 Sleep apnea, unspecified: Secondary | ICD-10-CM | POA: Diagnosis not present

## 2016-01-29 DIAGNOSIS — E11610 Type 2 diabetes mellitus with diabetic neuropathic arthropathy: Secondary | ICD-10-CM | POA: Diagnosis not present

## 2016-02-05 DIAGNOSIS — E11621 Type 2 diabetes mellitus with foot ulcer: Secondary | ICD-10-CM | POA: Diagnosis not present

## 2016-02-07 DIAGNOSIS — E11621 Type 2 diabetes mellitus with foot ulcer: Secondary | ICD-10-CM | POA: Diagnosis not present

## 2016-02-12 DIAGNOSIS — E11621 Type 2 diabetes mellitus with foot ulcer: Secondary | ICD-10-CM | POA: Diagnosis not present

## 2016-02-19 DIAGNOSIS — E11621 Type 2 diabetes mellitus with foot ulcer: Secondary | ICD-10-CM | POA: Diagnosis not present

## 2016-02-26 ENCOUNTER — Encounter (HOSPITAL_BASED_OUTPATIENT_CLINIC_OR_DEPARTMENT_OTHER): Payer: BLUE CROSS/BLUE SHIELD | Attending: Surgery

## 2016-02-26 DIAGNOSIS — G4730 Sleep apnea, unspecified: Secondary | ICD-10-CM | POA: Diagnosis not present

## 2016-02-26 DIAGNOSIS — E11610 Type 2 diabetes mellitus with diabetic neuropathic arthropathy: Secondary | ICD-10-CM | POA: Diagnosis not present

## 2016-02-26 DIAGNOSIS — E1140 Type 2 diabetes mellitus with diabetic neuropathy, unspecified: Secondary | ICD-10-CM | POA: Diagnosis not present

## 2016-02-26 DIAGNOSIS — L84 Corns and callosities: Secondary | ICD-10-CM | POA: Diagnosis not present

## 2016-02-26 DIAGNOSIS — L97422 Non-pressure chronic ulcer of left heel and midfoot with fat layer exposed: Secondary | ICD-10-CM | POA: Insufficient documentation

## 2016-02-26 DIAGNOSIS — E11621 Type 2 diabetes mellitus with foot ulcer: Secondary | ICD-10-CM | POA: Insufficient documentation

## 2016-03-04 DIAGNOSIS — E11621 Type 2 diabetes mellitus with foot ulcer: Secondary | ICD-10-CM | POA: Diagnosis not present

## 2016-03-11 DIAGNOSIS — E11621 Type 2 diabetes mellitus with foot ulcer: Secondary | ICD-10-CM | POA: Diagnosis not present

## 2016-03-18 DIAGNOSIS — E11621 Type 2 diabetes mellitus with foot ulcer: Secondary | ICD-10-CM | POA: Diagnosis not present

## 2016-03-25 DIAGNOSIS — E11621 Type 2 diabetes mellitus with foot ulcer: Secondary | ICD-10-CM | POA: Diagnosis not present

## 2016-04-01 ENCOUNTER — Encounter (HOSPITAL_BASED_OUTPATIENT_CLINIC_OR_DEPARTMENT_OTHER): Payer: BLUE CROSS/BLUE SHIELD | Attending: Surgery

## 2016-04-01 DIAGNOSIS — Z6841 Body Mass Index (BMI) 40.0 and over, adult: Secondary | ICD-10-CM | POA: Diagnosis not present

## 2016-04-01 DIAGNOSIS — L97522 Non-pressure chronic ulcer of other part of left foot with fat layer exposed: Secondary | ICD-10-CM | POA: Diagnosis not present

## 2016-04-01 DIAGNOSIS — G608 Other hereditary and idiopathic neuropathies: Secondary | ICD-10-CM | POA: Insufficient documentation

## 2016-04-01 DIAGNOSIS — E11610 Type 2 diabetes mellitus with diabetic neuropathic arthropathy: Secondary | ICD-10-CM | POA: Insufficient documentation

## 2016-04-01 DIAGNOSIS — E11621 Type 2 diabetes mellitus with foot ulcer: Secondary | ICD-10-CM | POA: Diagnosis not present

## 2016-04-01 DIAGNOSIS — G4730 Sleep apnea, unspecified: Secondary | ICD-10-CM | POA: Insufficient documentation

## 2016-04-01 DIAGNOSIS — L84 Corns and callosities: Secondary | ICD-10-CM | POA: Diagnosis not present

## 2016-04-08 DIAGNOSIS — E11621 Type 2 diabetes mellitus with foot ulcer: Secondary | ICD-10-CM | POA: Diagnosis not present

## 2016-04-15 DIAGNOSIS — E11621 Type 2 diabetes mellitus with foot ulcer: Secondary | ICD-10-CM | POA: Diagnosis not present

## 2016-04-22 DIAGNOSIS — E11621 Type 2 diabetes mellitus with foot ulcer: Secondary | ICD-10-CM | POA: Diagnosis not present

## 2016-04-27 DIAGNOSIS — K529 Noninfective gastroenteritis and colitis, unspecified: Secondary | ICD-10-CM

## 2016-04-27 HISTORY — DX: Noninfective gastroenteritis and colitis, unspecified: K52.9

## 2016-04-29 ENCOUNTER — Encounter (HOSPITAL_BASED_OUTPATIENT_CLINIC_OR_DEPARTMENT_OTHER): Payer: BLUE CROSS/BLUE SHIELD | Attending: Surgery

## 2016-04-29 DIAGNOSIS — G4730 Sleep apnea, unspecified: Secondary | ICD-10-CM | POA: Diagnosis not present

## 2016-04-29 DIAGNOSIS — E11610 Type 2 diabetes mellitus with diabetic neuropathic arthropathy: Secondary | ICD-10-CM | POA: Diagnosis not present

## 2016-04-29 DIAGNOSIS — E1140 Type 2 diabetes mellitus with diabetic neuropathy, unspecified: Secondary | ICD-10-CM | POA: Diagnosis not present

## 2016-04-29 DIAGNOSIS — L97422 Non-pressure chronic ulcer of left heel and midfoot with fat layer exposed: Secondary | ICD-10-CM | POA: Diagnosis not present

## 2016-04-29 DIAGNOSIS — E11621 Type 2 diabetes mellitus with foot ulcer: Secondary | ICD-10-CM | POA: Diagnosis not present

## 2016-04-29 DIAGNOSIS — L84 Corns and callosities: Secondary | ICD-10-CM | POA: Insufficient documentation

## 2016-05-04 ENCOUNTER — Encounter (HOSPITAL_COMMUNITY): Payer: Self-pay | Admitting: Emergency Medicine

## 2016-05-04 ENCOUNTER — Emergency Department (HOSPITAL_COMMUNITY): Payer: BLUE CROSS/BLUE SHIELD

## 2016-05-04 ENCOUNTER — Observation Stay (HOSPITAL_COMMUNITY)
Admission: EM | Admit: 2016-05-04 | Discharge: 2016-05-05 | Disposition: A | Discharge status: Home / Self Care | Payer: BLUE CROSS/BLUE SHIELD | Attending: Internal Medicine | Admitting: Internal Medicine

## 2016-05-04 DIAGNOSIS — E1140 Type 2 diabetes mellitus with diabetic neuropathy, unspecified: Secondary | ICD-10-CM

## 2016-05-04 DIAGNOSIS — R112 Nausea with vomiting, unspecified: Secondary | ICD-10-CM | POA: Diagnosis not present

## 2016-05-04 DIAGNOSIS — M14679 Charcot's joint, unspecified ankle and foot: Secondary | ICD-10-CM

## 2016-05-04 DIAGNOSIS — R197 Diarrhea, unspecified: Secondary | ICD-10-CM | POA: Insufficient documentation

## 2016-05-04 DIAGNOSIS — K529 Noninfective gastroenteritis and colitis, unspecified: Secondary | ICD-10-CM | POA: Diagnosis not present

## 2016-05-04 DIAGNOSIS — E1159 Type 2 diabetes mellitus with other circulatory complications: Secondary | ICD-10-CM | POA: Diagnosis not present

## 2016-05-04 DIAGNOSIS — Z79899 Other long term (current) drug therapy: Secondary | ICD-10-CM | POA: Insufficient documentation

## 2016-05-04 DIAGNOSIS — E872 Acidosis, unspecified: Secondary | ICD-10-CM

## 2016-05-04 DIAGNOSIS — M14672 Charcot's joint, left ankle and foot: Secondary | ICD-10-CM | POA: Diagnosis present

## 2016-05-04 DIAGNOSIS — A419 Sepsis, unspecified organism: Secondary | ICD-10-CM | POA: Diagnosis not present

## 2016-05-04 DIAGNOSIS — Z96653 Presence of artificial knee joint, bilateral: Secondary | ICD-10-CM | POA: Insufficient documentation

## 2016-05-04 DIAGNOSIS — E860 Dehydration: Secondary | ICD-10-CM

## 2016-05-04 DIAGNOSIS — R109 Unspecified abdominal pain: Secondary | ICD-10-CM | POA: Diagnosis present

## 2016-05-04 HISTORY — DX: Noninfective gastroenteritis and colitis, unspecified: K52.9

## 2016-05-04 HISTORY — DX: Type 2 diabetes mellitus without complications: E11.9

## 2016-05-04 LAB — URINALYSIS, ROUTINE W REFLEX MICROSCOPIC
Bilirubin Urine: NEGATIVE
GLUCOSE, UA: NEGATIVE mg/dL
Ketones, ur: NEGATIVE mg/dL
Leukocytes, UA: NEGATIVE
Nitrite: NEGATIVE
PH: 5 (ref 5.0–8.0)
PROTEIN: 30 mg/dL — AB

## 2016-05-04 LAB — COMPREHENSIVE METABOLIC PANEL
ALK PHOS: 74 U/L (ref 38–126)
ALT: 18 U/L (ref 14–54)
AST: 20 U/L (ref 15–41)
Albumin: 3.1 g/dL — ABNORMAL LOW (ref 3.5–5.0)
Anion gap: 11 (ref 5–15)
BUN: 12 mg/dL (ref 6–20)
CALCIUM: 8.8 mg/dL — AB (ref 8.9–10.3)
CO2: 21 mmol/L — ABNORMAL LOW (ref 22–32)
CREATININE: 0.8 mg/dL (ref 0.44–1.00)
Chloride: 104 mmol/L (ref 101–111)
GFR calc non Af Amer: >60 mL/min (ref >60)
Glucose, Bld: 222 mg/dL — ABNORMAL HIGH (ref 65–99)
Potassium: 5 mmol/L (ref 3.5–5.1)
Sodium: 136 mmol/L (ref 135–145)
Total Bilirubin: 0.4 mg/dL (ref 0.3–1.2)
Total Protein: 6.6 g/dL (ref 6.5–8.1)

## 2016-05-04 LAB — GLUCOSE, CAPILLARY: Glucose-Capillary: 162 mg/dL — ABNORMAL HIGH (ref 65–99)

## 2016-05-04 LAB — CBC
HCT: 49.8 % — ABNORMAL HIGH (ref 36.0–46.0)
Hemoglobin: 16.6 g/dL — ABNORMAL HIGH (ref 12.0–15.0)
MCH: 29.7 pg (ref 26.0–34.0)
MCHC: 33.3 g/dL (ref 30.0–36.0)
MCV: 89.2 fL (ref 78.0–100.0)
PLATELETS: 352 10*3/uL (ref 150–400)
RBC: 5.58 MIL/uL — AB (ref 3.87–5.11)
RDW: 14.7 % (ref 11.5–15.5)
WBC: 12.8 10*3/uL — ABNORMAL HIGH (ref 4.0–10.5)

## 2016-05-04 LAB — I-STAT CG4 LACTIC ACID, ED: Lactic Acid, Venous: 4.74 mmol/L (ref 0.5–1.9)

## 2016-05-04 LAB — LIPASE, BLOOD: Lipase: 25 U/L (ref 11–51)

## 2016-05-04 LAB — CG4 I-STAT (LACTIC ACID): Lactic Acid, Venous: 5.92 mmol/L (ref 0.5–1.9)

## 2016-05-04 LAB — LACTIC ACID, PLASMA: Lactic Acid, Venous: 3.7 mmol/L (ref 0.5–1.9)

## 2016-05-04 MED ORDER — SODIUM CHLORIDE 0.9 % IV BOLUS (SEPSIS)
1000.0000 mL | Freq: Once | INTRAVENOUS | Status: AC
  Administered 2016-05-05: 1000 mL via INTRAVENOUS

## 2016-05-04 MED ORDER — ONDANSETRON HCL 4 MG PO TABS: 4.0000 mg | ORAL_TABLET | Freq: Four times a day (QID) | ORAL | Status: DC | PRN

## 2016-05-04 MED ORDER — PIPERACILLIN-TAZOBACTAM 3.375 G IVPB
3.3750 g | Freq: Three times a day (TID) | INTRAVENOUS | Status: DC
  Administered 2016-05-04: 3.375 g via INTRAVENOUS
  Filled 2016-05-04 (×2): qty 50

## 2016-05-04 MED ORDER — LACTATED RINGERS IV BOLUS (SEPSIS)
1000.0000 mL | Freq: Once | INTRAVENOUS | Status: AC
  Administered 2016-05-04: 1000 mL via INTRAVENOUS

## 2016-05-04 MED ORDER — METRONIDAZOLE 500 MG PO TABS
500.0000 mg | ORAL_TABLET | Freq: Three times a day (TID) | ORAL | Status: DC
  Administered 2016-05-04 – 2016-05-05 (×3): 500 mg via ORAL
  Filled 2016-05-04 (×3): qty 1

## 2016-05-04 MED ORDER — ONDANSETRON HCL 4 MG/2ML IJ SOLN
4.0000 mg | Freq: Once | INTRAMUSCULAR | Status: AC
  Administered 2016-05-04: 4 mg via INTRAVENOUS
  Filled 2016-05-04: qty 2

## 2016-05-04 MED ORDER — INSULIN ASPART 100 UNIT/ML ~~LOC~~ SOLN
0.0000 [IU] | Freq: Three times a day (TID) | SUBCUTANEOUS | Status: DC
  Administered 2016-05-05: 3 [IU] via SUBCUTANEOUS
  Administered 2016-05-05: 2 [IU] via SUBCUTANEOUS

## 2016-05-04 MED ORDER — HYDROCODONE-ACETAMINOPHEN 10-325 MG PO TABS
1.0000 | ORAL_TABLET | Freq: Three times a day (TID) | ORAL | Status: DC | PRN
  Administered 2016-05-04: 1 via ORAL
  Filled 2016-05-04: qty 1

## 2016-05-04 MED ORDER — ONDANSETRON 4 MG PO TBDP
ORAL_TABLET | ORAL | Status: AC
  Filled 2016-05-04: qty 1

## 2016-05-04 MED ORDER — IOPAMIDOL (ISOVUE-300) INJECTION 61%
INTRAVENOUS | Status: AC
  Administered 2016-05-04: 100 mL
  Filled 2016-05-04: qty 100

## 2016-05-04 MED ORDER — MORPHINE SULFATE (PF) 4 MG/ML IV SOLN
4.0000 mg | Freq: Once | INTRAVENOUS | Status: AC
Start: 2016-05-04 — End: 2016-05-04
  Administered 2016-05-04: 4 mg via INTRAVENOUS
  Filled 2016-05-04: qty 1

## 2016-05-04 MED ORDER — ONDANSETRON 4 MG PO TBDP
4.0000 mg | ORAL_TABLET | Freq: Once | ORAL | Status: AC
  Administered 2016-05-04: 4 mg via ORAL

## 2016-05-04 MED ORDER — GLUCOSAMINE-CHONDROITIN 500-400 MG PO TABS: 2.0000 | ORAL_TABLET | Freq: Every day | ORAL | Status: DC

## 2016-05-04 MED ORDER — CIPROFLOXACIN IN D5W 400 MG/200ML IV SOLN
400.0000 mg | Freq: Two times a day (BID) | INTRAVENOUS | Status: DC
  Administered 2016-05-04 – 2016-05-05 (×2): 400 mg via INTRAVENOUS
  Filled 2016-05-04 (×2): qty 200

## 2016-05-04 MED ORDER — INSULIN ASPART 100 UNIT/ML ~~LOC~~ SOLN: 0.0000 [IU] | Freq: Every day | SUBCUTANEOUS | Status: DC

## 2016-05-04 MED ORDER — DULOXETINE HCL 60 MG PO CPEP
60.0000 mg | ORAL_CAPSULE | Freq: Every day | ORAL | Status: DC
  Administered 2016-05-04 – 2016-05-05 (×2): 60 mg via ORAL
  Filled 2016-05-04 (×2): qty 1

## 2016-05-04 MED ORDER — SODIUM CHLORIDE 0.9 % IV BOLUS (SEPSIS)
1000.0000 mL | Freq: Once | INTRAVENOUS | Status: AC
  Administered 2016-05-04: 1000 mL via INTRAVENOUS

## 2016-05-04 MED ORDER — ONDANSETRON HCL 4 MG/2ML IJ SOLN
4.0000 mg | Freq: Four times a day (QID) | INTRAMUSCULAR | Status: DC | PRN
  Administered 2016-05-04: 4 mg via INTRAVENOUS
  Filled 2016-05-04: qty 2

## 2016-05-04 MED ORDER — LACTATED RINGERS IV BOLUS (SEPSIS)
1000.0000 mL | Freq: Once | INTRAVENOUS | Status: AC
  Administered 2016-05-04 (×2): 1000 mL via INTRAVENOUS

## 2016-05-04 MED ORDER — MIRABEGRON ER 50 MG PO TB24
50.0000 mg | ORAL_TABLET | Freq: Every day | ORAL | Status: DC
  Administered 2016-05-04 – 2016-05-05 (×2): 50 mg via ORAL
  Filled 2016-05-04 (×3): qty 1

## 2016-05-04 MED ORDER — SODIUM CHLORIDE 0.9% FLUSH
3.0000 mL | Freq: Two times a day (BID) | INTRAVENOUS | Status: DC
  Administered 2016-05-04: 3 mL via INTRAVENOUS

## 2016-05-04 MED ORDER — SODIUM CHLORIDE 0.9 % IV SOLN
INTRAVENOUS | Status: DC
  Administered 2016-05-04 – 2016-05-05 (×2): via INTRAVENOUS

## 2016-05-04 MED ORDER — ALLOPURINOL 100 MG PO TABS
100.0000 mg | ORAL_TABLET | Freq: Every day | ORAL | Status: DC
  Administered 2016-05-04 – 2016-05-05 (×2): 100 mg via ORAL
  Filled 2016-05-04 (×2): qty 1

## 2016-05-04 MED ORDER — DICLOFENAC SODIUM 75 MG PO TBEC
75.0000 mg | DELAYED_RELEASE_TABLET | Freq: Two times a day (BID) | ORAL | Status: DC
  Administered 2016-05-05: 75 mg via ORAL
  Filled 2016-05-04 (×4): qty 1

## 2016-05-04 MED ORDER — ADULT MULTIVITAMIN W/MINERALS CH
1.0000 | ORAL_TABLET | Freq: Every day | ORAL | Status: DC
  Administered 2016-05-04 – 2016-05-05 (×2): 1 via ORAL
  Filled 2016-05-04 (×2): qty 1

## 2016-05-04 MED ORDER — ACETAMINOPHEN 325 MG PO TABS: 650.0000 mg | ORAL_TABLET | Freq: Four times a day (QID) | ORAL | Status: DC | PRN

## 2016-05-04 MED ORDER — ACETAMINOPHEN 650 MG RE SUPP: 650.0000 mg | Freq: Four times a day (QID) | RECTAL | Status: DC | PRN

## 2016-05-04 MED ORDER — KCL IN DEXTROSE-NACL 20-5-0.45 MEQ/L-%-% IV SOLN
Freq: Once | INTRAVENOUS | Status: AC
  Administered 2016-05-04: 21:00:00 via INTRAVENOUS
  Filled 2016-05-04: qty 1000

## 2016-05-04 NOTE — ED Provider Notes (Signed)
Sepsis - Repeat Assessment  Performed at:    1700  Vitals     Blood pressure (!) 92/48, pulse 85, temperature 97.5 F (36.4 C), temperature source Oral, resp. rate 23, height 5\' 4"  (1.626 m), weight 277 lb (125.6 kg), last menstrual period 04/26/2016, SpO2 93 %.  Heart:     Regular rate and rhythm  Lungs:    CTA  Capillary Refill:   <2 sec  Peripheral Pulse:   Radial pulse palpable, Dorsalis pedis pulse  palpable and Posterior tibialis pulse  palpable  Skin:     Normal Color and Dry   Markedly improved perfusion on exam and patient feels better. She remains mildly hypotensive but MAP is > 65.   Shaune Pollackameron Makyle Eslick, MD 05/04/16 1745

## 2016-05-04 NOTE — ED Triage Notes (Signed)
Last Tuesday had indigestion saw her dr   Lucia BitterWed, given meds . Thursday better  Then started to have v and diarrhea yesterday and severe abd pain

## 2016-05-04 NOTE — ED Provider Notes (Signed)
[facility]-EMERGENCY DEPT Provider Note   CSN: [id number] Arrival date & time: 05/04/16  [id number]     History   Chief Complaint Chief Complaint  Patient presents with  . Emesis  . Abdominal Pain    HPI [name] is a 51 y.o. female.  HPI 51 yo F with PMHx as below here with nausea, vomiting, diarrhea x 1.5 weeks. Pt sx started as significant nausea and vomiting last week. No known sick contacts. She saw her PCP who prescribed zofran. She states this improved her nausea but over the past several hours, she has developed severe non bloody, watery diarrhea. This has persisted for days and she has been unable to keep any food down as well over past day 2/2 nausea. No fevers. She describes associated severe abdominal cramping that comes and goes. No urinary symptoms.  Past Medical History:  Diagnosis Date  . Arthritis   . Diabetes mellitus without complication (HCC)   . Headache(784.0)    hormonal headache  . Neuropathy (HCC)    bilateral feet  . Obese   . Pneumonia    hx 20+ years ago  . Sleep apnea    bipap 1 yr  . Ulcer of foot (HCC)    toe ulcer greater than a year ago    Patient Active Problem List   Diagnosis Date Noted  . Gastroenteritis 05/04/2016  . Diabetes (HCC) 05/04/2016  . Charcot ankle 08/03/2014  . 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 Postoperative anemia due to acute blood loss 06/25/2011  . Hyperglycemia 06/25/2011  . Osteoarthritis of knee 06/23/2011  . Obesity, morbid (more than 100 lbs over ideal weight or BMI > 40) (HCC) 06/23/2011    Past Surgical History:  Procedure Laterality Date  . ANKLE FUSION Left 08/03/2014   Procedure: Left Tibiocalcaneal Fusion;  Surgeon: Nadara MustardMarcus Duda V, MD;  [facility];  Service: Orthopedics;  Laterality: Left;  . APPLICATION OF WOUND VAC Left 08/03/2014   Procedure: APPLICATION OF WOUND VAC;  Surgeon: Nadara MustardMarcus Duda V, MD;  Location: [facility] OR;  Service: Orthopedics;  Laterality: Left;  . I&D KNEE WITH POLY  EXCHANGE Right 01/31/2013   Procedure: IRRIGATION AND DEBRIDEMENT KNEE WITH POLY EXCHANGE possible Antibiotic Spacer;  Surgeon: Valeria BatmanPeter W Whitfield, MD;  Location: [facility] OR;  Service: Orthopedics;  Laterality: Right;  . INCISION AND DRAINAGE Right 01/31/2013   ANTIBIOTIC SPACER  RIGHT KNEE  DR Cleophas DunkerWHITFIELD   . KNEE ARTHROSCOPY     left knee  . ORIF ANKLE FRACTURE Left 03/16/2014   Procedure: Foot Excision Charcot Collapse,  Internal Fixation;  Surgeon: Nadara MustardMarcus Duda V, MD;  Location: [facility] OR;  Service: Orthopedics;  Laterality: Left;  . REPLACEMENT TOTAL KNEE     left  . TOTAL KNEE ARTHROPLASTY  06/23/2011   Procedure: TOTAL KNEE ARTHROPLASTY;  Surgeon: Valeria BatmanPeter W Whitfield, MD;  [facility];  Service: Orthopedics;  Laterality: Right;  RIGHT TOTAL KNEE REPLACEMENT     OB History    No data available       Home Medications    Prior to Admission medications   Medication Sig Start Date End Date Taking? Authorizing Provider  allopurinol (ZYLOPRIM) 100 MG tablet Take 100 mg by mouth every morning.    Yes Historical Provider, MD  diclofenac (VOLTAREN) 75 MG EC tablet Take 75 mg by mouth 2 (two) times daily.  02/28/14  Yes Historical Provider, MD  DULoxetine (CYMBALTA) 60 MG capsule Take 60 mg by mouth daily.  Yes Historical Provider, MD  Exenatide ER 2 MG PEN Inject 2 mg into the skin every 7 (seven) days. 02/06/16  Yes Historical Provider, MD  glucosamine-chondroitin 500-400 MG tablet Take 2 tablets by mouth daily.    Yes Historical Provider, MD  HYDROcodone-acetaminophen (NORCO) 10-325 MG per tablet Take 1 tablet by mouth every 8 (eight) hours as needed for moderate pain or severe pain.  10/09/13  Yes Historical Provider, MD  mirabegron ER (MYRBETRIQ) 50 MG TB24 tablet Take 50 mg by mouth every morning. 01/13/16  Yes Historical Provider, MD  Multiple Vitamin (MULTIVITAMIN WITH MINERALS) TABS Take 1 tablet by mouth daily.   Yes Historical Provider, MD  ondansetron (ZOFRAN-ODT) 8 MG disintegrating tablet  Take 8 mg by mouth every 8 (eight) hours as needed for nausea/vomiting. DISSOLVE IN THE MOUTH 04/30/16  Yes Historical Provider, MD  SitaGLIPtin-MetFORMIN HCl (JANUMET XR) 50-1000 MG TB24 Take 1 tablet by mouth at bedtime. 11/21/15  Yes Historical Provider, MD  amoxicillin-clavulanate (AUGMENTIN) 500-125 MG per tablet Take 1 tablet (500 mg total) by mouth 2 (two) times daily. Patient not taking: Reported on 05/04/2016 01/08/15   Ginnie Smart, MD  oxyCODONE-acetaminophen (ROXICET) 5-325 MG per tablet Take 1 tablet by mouth every 4 (four) hours as needed for severe pain. Patient not taking: Reported on 05/04/2016 08/04/14   Nadara Mustard, MD    Family History Family History  Problem Relation Age of Onset  . Anesthesia problems Father   . Stroke Father   . Hypotension Neg Hx   . Malignant hyperthermia Neg Hx   . Pseudochol deficiency Neg Hx     Social History Social History  Substance Use Topics  . Smoking status: Never Smoker  . Smokeless tobacco: Never Used  . Alcohol use 0.0 oz/week     Comment: occ     Allergies   Patient has no known allergies.   Review of Systems Review of Systems  Constitutional: Positive for fatigue. Negative for chills and fever.  HENT: Negative for congestion, rhinorrhea and sore throat.   Eyes: Negative for visual disturbance.  Respiratory: Negative for cough, shortness of breath and wheezing.   Cardiovascular: Negative for chest pain and leg swelling.  Gastrointestinal: Positive for abdominal pain, diarrhea, nausea and vomiting.  Genitourinary: Negative for dysuria, flank pain, vaginal bleeding and vaginal discharge.  Musculoskeletal: Negative for neck pain.  Skin: Negative for rash.  Allergic/Immunologic: Negative for immunocompromised state.  Neurological: Positive for weakness. Negative for syncope and headaches.  Hematological: Does not bruise/bleed easily.  All other systems reviewed and are negative.    Physical Exam Updated Vital  Signs BP (!) 94/57 (BP Location: Left Arm)   Pulse (!) 105   Temp 98.5 F (36.9 C) (Oral)   Resp 15   Ht 5\' 4"  (1.626 m)   Wt 277 lb (125.6 kg)   LMP 04/26/2016   SpO2 99%   BMI 47.55 kg/m   Physical Exam  Constitutional: She is oriented to person, place, and time. She appears well-developed and well-nourished. No distress.  HENT:  Head: Normocephalic and atraumatic.  Mouth/Throat: No oropharyngeal exudate.  Markedly dry mucous membranes  Eyes: Conjunctivae are normal.  Neck: Neck supple.  Cardiovascular: Normal rate, regular rhythm and normal heart sounds.  Exam reveals no friction rub.   No murmur heard. Pulmonary/Chest: Effort normal and breath sounds normal. No respiratory distress. She has no wheezes. She has no rales.  Abdominal: Soft. Normal appearance and bowel sounds are normal. She exhibits no  distension. There is generalized tenderness and tenderness in the right lower quadrant, suprapubic area, left upper quadrant and left lower quadrant. There is no rigidity, no rebound and no guarding.  Musculoskeletal: She exhibits no edema.  Neurological: She is alert and oriented to person, place, and time. She exhibits normal muscle tone.  Skin: Skin is warm. Capillary refill takes less than 2 seconds.  Psychiatric: She has a normal mood and affect.  Nursing note and vitals reviewed.    ED Treatments / Results  Labs (all labs ordered are listed, but only abnormal results are displayed) Labs Reviewed  COMPREHENSIVE METABOLIC PANEL - Abnormal; Notable for the following:       Result Value   CO2 21 (*)    Glucose, Bld 222 (*)    Calcium 8.8 (*)    Albumin 3.1 (*)    All other components within normal limits  CBC - Abnormal; Notable for the following:    WBC 12.8 (*)    RBC 5.58 (*)    Hemoglobin 16.6 (*)    HCT 49.8 (*)    All other components within normal limits  URINALYSIS, ROUTINE W REFLEX MICROSCOPIC - Abnormal; Notable for the following:    APPearance HAZY (*)     Specific Gravity, Urine >1.046 (*)    Hgb urine dipstick SMALL (*)    Protein, ur 30 (*)    Bacteria, UA RARE (*)    Squamous Epithelial / LPF 6-30 (*)    All other components within normal limits  I-STAT CG4 LACTIC ACID, ED - Abnormal; Notable for the following:    Lactic Acid, Venous 4.74 (*)    All other components within normal limits  CG4 I-STAT (LACTIC ACID) - Abnormal; Notable for the following:    Lactic Acid, Venous 5.92 (*)    All other components within normal limits  GASTROINTESTINAL PANEL BY PCR, STOOL (REPLACES STOOL CULTURE)  CULTURE, BLOOD (ROUTINE X 2)  CULTURE, BLOOD (ROUTINE X 2)  LIPASE, BLOOD  LACTIC ACID, PLASMA  BASIC METABOLIC PANEL  CBC  I-STAT CG4 LACTIC ACID, ED  I-STAT CG4 LACTIC ACID, ED    EKG  EKG Interpretation  Date/Time:  Monday May 04 2016 14:53:37 EST Ventricular Rate:  104 PR Interval:  146 QRS Duration: 80 QT Interval:  372 QTC Calculation: 489 R Axis:   78 Text Interpretation:  Sinus tachycardia Otherwise normal ECG No significant change since last tracing Confirmed by Dessa Ledee MD, Sheria Lang [phone]) on 05/04/2016 5:43:56 PM       Radiology Ct Abdomen Pelvis W Contrast  Result Date: 05/04/2016 CLINICAL DATA:  Lower abdominal pain starting Saturday, diarrhea, nausea and vomiting EXAM: CT ABDOMEN AND PELVIS WITH CONTRAST TECHNIQUE: Multidetector CT imaging of the abdomen and pelvis was performed using the standard protocol following bolus administration of intravenous contrast. CONTRAST:  ISOVUE-300 IOPAMIDOL (ISOVUE-300) INJECTION 61% COMPARISON:  None. FINDINGS: Lower chest: Lung bases are normal. There is a small hiatal hernia. Thickening of distal esophageal wall. Gastroesophageal reflux cannot be excluded. Clinical correlation is necessary P Hepatobiliary: Fatty infiltration of the liver is noted. No calcified gallstones are noted within gallbladder. No focal hepatic mass. Pancreas: No focal pancreatic abnormality. No evidence of  acute pancreatitis. Spleen: Enhanced spleen is normal. Adrenals/Urinary Tract: No adrenal gland mass. Enhanced kidneys are symmetrical in size. No hydronephrosis or hydroureter. Delayed renal images shows shows bilateral mild delay excretion. Please correlate with renal function test. Stomach/Bowel: No gastric outlet obstruction. Mild fluid distended distal small bowel loops  in mid abdomen and pelvis. Enteritis cannot be excluded. There is no transition point in caliber of small bowel. Less likely ileus or early bowel obstruction. Some colonic gas and fluid noted within right colon and transverse colon. Diarrhea cannot be excluded. Normal appendix partially visualized in axial image 69. Measures 4.4 mm in diameter. No pericecal inflammation. Some liquid stool noted within sigmoid colon and rectum. Diarrhea cannot be excluded. Vascular/Lymphatic: No aortic aneurysm.  No adenopathy. Reproductive: There is a low-lying IUD within uterus. There is question of a left fundal submucosal fibroid measure about 3 cm axial image 76. Probable fundal fibroid sagittal image 84 measures 1.3 cm. There might be a right fundal fibroid measures about 3 cm. Correlation with pelvic ultrasound MRI in GYN exam is recommended. Other: There is a right ovarian cyst measures 3.6 cm. No adnexal mass is noted. Musculoskeletal: No destructive bony lesions are noted. There are degenerative changes thoracolumbar spine. IMPRESSION: 1. Small hiatal hernia. There is thickening of distal esophageal wall. Gastroesophageal reflux cannot be excluded. 2. Significant fatty infiltration of the liver. 3. Question mild bilateral delayed renal excretion. Please correlate with renal function test. 4. Minimal fluid distended distal small bowel loops. Findings suspicious for enteritis. Less likely ileus or early bowel obstruction. Some liquid stool and gas noted within right colon and transverse colon. Diarrhea cannot be excluded. Normal appendix partially  visualized. No pericecal inflammation. No evidence of colitis. 5. Low lying IUD within uterus. Question myometrial fibroids within uterus. Correlation with GYN exam and further correlation with pelvic ultrasound or MRI could be performed. 6. There is a right ovarian cyst measures 3.6 cm. Electronically Signed   By: Natasha Mead M.D.   On: 05/04/2016 14:48    Procedures Procedures (including critical care time)  Medications Ordered in ED Medications  mirabegron ER (MYRBETRIQ) tablet 50 mg (not administered)  allopurinol (ZYLOPRIM) tablet 100 mg (not administered)  diclofenac (VOLTAREN) EC tablet 75 mg (not administered)  HYDROcodone-acetaminophen (NORCO) 10-325 MG per tablet 1 tablet (not administered)  DULoxetine (CYMBALTA) DR capsule 60 mg (not administered)  multivitamin with minerals tablet 1 tablet (not administered)  sodium chloride flush (NS) 0.9 % injection 3 mL (not administered)  acetaminophen (TYLENOL) tablet 650 mg (not administered)    Or  acetaminophen (TYLENOL) suppository 650 mg (not administered)  ondansetron (ZOFRAN) tablet 4 mg (not administered)    Or  ondansetron (ZOFRAN) injection 4 mg (not administered)  ciprofloxacin (CIPRO) IVPB 400 mg (not administered)  metroNIDAZOLE (FLAGYL) tablet 500 mg (not administered)  0.9 %  sodium chloride infusion (not administered)  insulin aspart (novoLOG) injection 0-5 Units (not administered)  insulin aspart (novoLOG) injection 0-15 Units (not administered)  dextrose 5 % and 0.45 % NaCl with KCl 20 mEq/L infusion (not administered)  ondansetron (ZOFRAN-ODT) disintegrating tablet 4 mg (4 mg Oral Given 05/04/16 1250)  sodium chloride 0.9 % bolus 1,000 mL (0 mLs Intravenous Stopped 05/04/16 1544)  sodium chloride 0.9 % bolus 1,000 mL (0 mLs Intravenous Stopped 05/04/16 1544)  ondansetron (ZOFRAN) injection 4 mg (4 mg Intravenous Given 05/04/16 1441)  morphine 4 MG/ML injection 4 mg (4 mg Intravenous Given 05/04/16 1441)  iopamidol (ISOVUE-300)  61 % injection (100 mLs  Contrast Given 05/04/16 1414)  lactated ringers bolus 1,000 mL (0 mLs Intravenous Stopped 05/04/16 1723)  lactated ringers bolus 1,000 mL (0 mLs Intravenous Stopped 05/04/16 1723)  ondansetron (ZOFRAN) injection 4 mg (4 mg Intravenous Given 05/04/16 1657)     Initial Impression / Assessment and Plan /  ED Course  I have reviewed the triage vital signs and the nursing notes.  Pertinent labs & imaging results that were available during my care of the patient were reviewed by me and considered in my medical decision making (see chart for details).  Clinical Course     51 yo F with PMHx as above here with n/v/d x 1.5 weeks, now with generalized fatigue. On arrival, pt tachycardic, borderline hypotensive. Markedly dry on exam. Lab work from triage remarkable for mild leukocytosis and hemoconcentration, o/w largely reassuring with normal AG. Will obtain CT. History, exam most c/w viral gastroenteritis but must consider diverticuiltis, colitis, enteritis, less likely appendicitis. No RUQ TTP or signs of cholecystitis. No recent ABX use.  CT scan shows distended small bowel loops c/w enteritis. Of note, LA elevated at 4.7. Suspect this is dehydration but must consider sepsis with bacterial enteritis. Will give Zosyn, BCx, and admit to Hospitalist. Pt updated, improved on repeat exam with resolution of tachycardia. Repeat LA ordered and is pending. Manual BP remains with MAP>65. IVF continued.   Final Clinical Impressions(s) / ED Diagnoses   Final diagnoses:  Nausea vomiting and diarrhea  Sepsis, due to unspecified organism (HCC)  Lactic acidosis  Dehydration    New Prescriptions Current Discharge Medication List       Shaune Pollack, MD 05/04/16 1941

## 2016-05-04 NOTE — ED Notes (Signed)
Informed Dr. Erma HeritageIsaacs and Toni Amendourtney - RN of pt's BP.

## 2016-05-04 NOTE — Progress Notes (Signed)
PHARMACIST - PHYSICIAN ORDER COMMUNICATION  CONCERNING: P&T Medication Policy on Herbal Medications  DESCRIPTION:  This patient's order for:  Glucosamine-chondroitin has been noted.  This product(s) is classified as an "herbal" or natural product. Due to a lack of definitive safety studies or FDA approval, nonstandard manufacturing practices, plus the potential risk of unknown drug-drug interactions while on inpatient medications, the Pharmacy and Therapeutics Committee does not permit the use of "herbal" or natural products of this type [facility].   ACTION TAKEN: The pharmacy department is unable to verify this order at this time. Please reevaluate patient's clinical condition at discharge and address if the herbal or natural product(s) should be resumed at that time.   Nicolette Bangheresa Ranie Chinchilla, RPh 7:06 PM 05/04/2016

## 2016-05-04 NOTE — ED Notes (Signed)
Andy notified on the floor of the patients istat lactic acid coming back elevated.

## 2016-05-04 NOTE — ED Notes (Signed)
Pt returned to hallway from CT 

## 2016-05-04 NOTE — H&P (Addendum)
History and Physical    [name] NWG:[id number] DOB: October 25, 1965 DOA: 05/04/2016  Referring MD/NP/PA: er PCP: [name], MD Outpatient Specialists:  Patient coming from: home  Chief Complaint: abd pain/diarrhea  HPI: [name] is a 51 y.o. female with medical history significant of DM, sleep apnea, charcot joint who presents with an almost 1 week history of nausea developing into diarrhea and abdominal pain.  No sick contacts.  Appropriately saw PCP after first few days who gave zofran with some improvement in nausea but over the weekend, patient developed worsening nausea, vomiting and abdominal pain with liquid stools.      ED Course: In the ER, lactic acid was elevated and CT Scan showed a Small hiatal hernia with thickening of distal esophageal wall. Gastroesophageal reflux cannot be excluded.  Significant fatty infiltration of the liver.  Minimal fluid distended distal small bowel loops. Findings suspicious for enteritis. Less likely ileus or early bowel obstruction. Some liquid stool and gas noted within right colon and transverse colon. Diarrhea cannot be excluded. Normal appendix partially visualized. No pericecal inflammation. No evidence of colitis.  Low lying IUD within uterus. Question myometrial fibroids within uterus. Correlation with GYN exam and further correlation with pelvic ultrasound or MRI could be performed.  There is a right ovarian cyst measures 3.6 cm.   Review of Systems: all systems reviewed, negative unless stated above in HPI   Past Medical History:  Diagnosis Date  . Arthritis   . Diabetes mellitus without complication (HCC)   . Headache(784.0)    hormonal headache  . Neuropathy (HCC)    bilateral feet  . Obese   . Pneumonia    hx 20+ years ago  . Sleep apnea    bipap 1 yr  . Ulcer of foot (HCC)    toe ulcer greater than a year ago    Past Surgical History:  Procedure Laterality Date  . ANKLE FUSION Left 08/03/2014   Procedure:  Left Tibiocalcaneal Fusion;  Surgeon: Nadara Mustard, MD;  Location: [facility] OR;  Service: Orthopedics;  Laterality: Left;  . APPLICATION OF WOUND VAC Left 08/03/2014   Procedure: APPLICATION OF WOUND VAC;  Surgeon: Nadara Mustard, MD;  Location: [facility] OR;  Service: Orthopedics;  Laterality: Left;  . I&D KNEE WITH POLY EXCHANGE Right 01/31/2013   Procedure: IRRIGATION AND DEBRIDEMENT KNEE WITH POLY EXCHANGE possible Antibiotic Spacer;  Surgeon: Valeria Batman, MD;  Location: [facility] OR;  Service: Orthopedics;  Laterality: Right;  . INCISION AND DRAINAGE Right 01/31/2013   ANTIBIOTIC SPACER  RIGHT KNEE  DR Cleophas Dunker   . KNEE ARTHROSCOPY     left knee  . ORIF ANKLE FRACTURE Left 03/16/2014   Procedure: Foot Excision Charcot Collapse,  Internal Fixation;  Surgeon: Nadara Mustard, MD;  Location: [facility] OR;  Service: Orthopedics;  Laterality: Left;  . REPLACEMENT TOTAL KNEE     left  . TOTAL KNEE ARTHROPLASTY  06/23/2011   Procedure: TOTAL KNEE ARTHROPLASTY;  Surgeon: Valeria Batman, MD;  Location: [facility] OR;  Service: Orthopedics;  Laterality: Right;  RIGHT TOTAL KNEE REPLACEMENT      reports that she has never smoked. She has never used smokeless tobacco. She reports that she drinks alcohol. She reports that she does not use drugs.  No Known Allergies  Family History  Problem Relation Age of Onset  . Anesthesia problems Father   . Stroke Father   . Hypotension Neg Hx   . Malignant hyperthermia Neg Hx   .  Pseudochol deficiency Neg Hx      Prior to Admission medications   Medication Sig Start Date End Date Taking? Authorizing Provider  allopurinol (ZYLOPRIM) 100 MG tablet Take 100 mg by mouth every morning.    Yes Historical Provider, MD  diclofenac (VOLTAREN) 75 MG EC tablet Take 75 mg by mouth 2 (two) times daily.  02/28/14  Yes Historical Provider, MD  DULoxetine (CYMBALTA) 60 MG capsule Take 60 mg by mouth daily.   Yes Historical Provider, MD  Exenatide ER 2 MG PEN Inject 2 mg into the skin every 7  (seven) days. 02/06/16  Yes Historical Provider, MD  glucosamine-chondroitin 500-400 MG tablet Take 2 tablets by mouth daily.    Yes Historical Provider, MD  HYDROcodone-acetaminophen (NORCO) 10-325 MG per tablet Take 1 tablet by mouth every 8 (eight) hours as needed for moderate pain or severe pain.  10/09/13  Yes Historical Provider, MD  mirabegron ER (MYRBETRIQ) 50 MG TB24 tablet Take 50 mg by mouth every morning. 01/13/16  Yes Historical Provider, MD  Multiple Vitamin (MULTIVITAMIN WITH MINERALS) TABS Take 1 tablet by mouth daily.   Yes Historical Provider, MD  ondansetron (ZOFRAN-ODT) 8 MG disintegrating tablet Take 8 mg by mouth every 8 (eight) hours as needed for nausea/vomiting. DISSOLVE IN THE MOUTH 04/30/16  Yes Historical Provider, MD  SitaGLIPtin-MetFORMIN HCl (JANUMET XR) 50-1000 MG TB24 Take 1 tablet by mouth at bedtime. 11/21/15  Yes Historical Provider, MD  amoxicillin-clavulanate (AUGMENTIN) 500-125 MG per tablet Take 1 tablet (500 mg total) by mouth 2 (two) times daily. Patient not taking: Reported on 05/04/2016 01/08/15   Ginnie Smart, MD  oxyCODONE-acetaminophen (ROXICET) 5-325 MG per tablet Take 1 tablet by mouth every 4 (four) hours as needed for severe pain. Patient not taking: Reported on 05/04/2016 08/04/14   Nadara Mustard, MD    Physical Exam: Vitals:   05/04/16 1446 05/04/16 1500 05/04/16 1543 05/04/16 1700  BP: (!) 94/51 103/64 105/65 95/76  Pulse: 105  100 85  Resp: 18  18 23   Temp:      TempSrc:      SpO2: 100%  100% 93%  Weight:      Height:          Constitutional: NAD, calm, comfortable Vitals:   05/04/16 1446 05/04/16 1500 05/04/16 1543 05/04/16 1700  BP: (!) 94/51 103/64 105/65 95/76  Pulse: 105  100 85  Resp: 18  18 23   Temp:      TempSrc:      SpO2: 100%  100% 93%  Weight:      Height:       Eyes: PERRL, lids and conjunctivae normal ENMT: Mucous membranes are moist. Posterior pharynx clear of any exudate or lesions.Normal dentition.  Neck:  normal, supple, no masses, no thyromegaly Respiratory: clear to auscultation bilaterally, no wheezing, no crackles. Normal respiratory effort. No accessory muscle use.  Cardiovascular: Regular rate and rhythm, no murmurs / rubs / gallops. No extremity edema. 2+ pedal pulses. No carotid bruits.  Abdomen: tender  In lower quadrants, increased bowel sounds  Musculoskeletal: no clubbing / cyanosis. No joint deformity upper and lower extremities. Good ROM, no contractures. Normal muscle tone.  Skin: no rashes, lesions, ulcers. No induration Neurologic: CN 2-12 grossly intact. Sensation intact, DTR normal. Strength 5/5 in all 4.  Psychiatric: Normal judgment and insight. Alert and oriented x 3. Normal mood.    Labs on Admission: I have personally reviewed following labs and imaging studies  CBC:  Recent  Labs Lab 05/04/16 0936  WBC 12.8*  HGB 16.6*  HCT 49.8*  MCV 89.2  PLT 352   Basic Metabolic Panel:  Recent Labs Lab 05/04/16 0936  NA 136  K 5.0  CL 104  CO2 21*  GLUCOSE 222*  BUN 12  CREATININE 0.80  CALCIUM 8.8*   GFR: Estimated Creatinine Clearance: 110.4 mL/min (by C-G formula based on SCr of 0.8 mg/dL). Liver Function Tests:  Recent Labs Lab 05/04/16 0936  AST 20  ALT 18  ALKPHOS 74  BILITOT 0.4  PROT 6.6  ALBUMIN 3.1*    Recent Labs Lab 05/04/16 0936  LIPASE 25   No results for input(s): AMMONIA in the last 168 hours. Coagulation Profile: No results for input(s): INR, PROTIME in the last 168 hours. Cardiac Enzymes: No results for input(s): CKTOTAL, CKMB, CKMBINDEX, TROPONINI in the last 168 hours. BNP (last 3 results) No results for input(s): PROBNP in the last 8760 hours. HbA1C: No results for input(s): HGBA1C in the last 72 hours. CBG: No results for input(s): GLUCAP in the last 168 hours. Lipid Profile: No results for input(s): CHOL, HDL, LDLCALC, TRIG, CHOLHDL, LDLDIRECT in the last 72 hours. Thyroid Function Tests: No results for input(s):  TSH, T4TOTAL, FREET4, T3FREE, THYROIDAB in the last 72 hours. Anemia Panel: No results for input(s): VITAMINB12, FOLATE, FERRITIN, TIBC, IRON, RETICCTPCT in the last 72 hours. Urine analysis:    Component Value Date/Time   COLORURINE YELLOW 03/23/2014 1211   APPEARANCEUR CLEAR 03/23/2014 1211   LABSPEC 1.021 03/23/2014 1211   PHURINE 6.5 03/23/2014 1211   GLUCOSEU NEGATIVE 03/23/2014 1211   HGBUR NEGATIVE 03/23/2014 1211   BILIRUBINUR NEGATIVE 03/23/2014 1211   KETONESUR NEGATIVE 03/23/2014 1211   PROTEINUR NEGATIVE 03/23/2014 1211   UROBILINOGEN 0.2 03/23/2014 1211   NITRITE NEGATIVE 03/23/2014 1211   LEUKOCYTESUR NEGATIVE 03/23/2014 1211   Sepsis Labs: Invalid input(s): PROCALCITONIN, LACTICIDVEN No results found for this or any previous visit (from the past 240 hour(s)).   Radiological Exams on Admission: Ct Abdomen Pelvis W Contrast  Result Date: 05/04/2016 CLINICAL DATA:  Lower abdominal pain starting Saturday, diarrhea, nausea and vomiting EXAM: CT ABDOMEN AND PELVIS WITH CONTRAST TECHNIQUE: Multidetector CT imaging of the abdomen and pelvis was performed using the standard protocol following bolus administration of intravenous contrast. CONTRAST:  100mL ISOVUE-300 IOPAMIDOL (ISOVUE-300) INJECTION 61% COMPARISON:  None. FINDINGS: Lower chest: Lung bases are normal. There is a small hiatal hernia. Thickening of distal esophageal wall. Gastroesophageal reflux cannot be excluded. Clinical correlation is necessary P Hepatobiliary: Fatty infiltration of the liver is noted. No calcified gallstones are noted within gallbladder. No focal hepatic mass. Pancreas: No focal pancreatic abnormality. No evidence of acute pancreatitis. Spleen: Enhanced spleen is normal. Adrenals/Urinary Tract: No adrenal gland mass. Enhanced kidneys are symmetrical in size. No hydronephrosis or hydroureter. Delayed renal images shows shows bilateral mild delay excretion. Please correlate with renal function test.  Stomach/Bowel: No gastric outlet obstruction. Mild fluid distended distal small bowel loops in mid abdomen and pelvis. Enteritis cannot be excluded. There is no transition point in caliber of small bowel. Less likely ileus or early bowel obstruction. Some colonic gas and fluid noted within right colon and transverse colon. Diarrhea cannot be excluded. Normal appendix partially visualized in axial image 69. Measures 4.4 mm in diameter. No pericecal inflammation. Some liquid stool noted within sigmoid colon and rectum. Diarrhea cannot be excluded. Vascular/Lymphatic: No aortic aneurysm.  No adenopathy. Reproductive: There is a low-lying IUD within uterus. There is question  of a left fundal submucosal fibroid measure about 3 cm axial image 76. Probable fundal fibroid sagittal image 84 measures 1.3 cm. There might be a right fundal fibroid measures about 3 cm. Correlation with pelvic ultrasound MRI in GYN exam is recommended. Other: There is a right ovarian cyst measures 3.6 cm. No adnexal mass is noted. Musculoskeletal: No destructive bony lesions are noted. There are degenerative changes thoracolumbar spine. IMPRESSION: 1. Small hiatal hernia. There is thickening of distal esophageal wall. Gastroesophageal reflux cannot be excluded. 2. Significant fatty infiltration of the liver. 3. Question mild bilateral delayed renal excretion. Please correlate with renal function test. 4. Minimal fluid distended distal small bowel loops. Findings suspicious for enteritis. Less likely ileus or early bowel obstruction. Some liquid stool and gas noted within right colon and transverse colon. Diarrhea cannot be excluded. Normal appendix partially visualized. No pericecal inflammation. No evidence of colitis. 5. Low lying IUD within uterus. Question myometrial fibroids within uterus. Correlation with GYN exam and further correlation with pelvic ultrasound or MRI could be performed. 6. There is a right ovarian cyst measures 3.6 cm.  Electronically Signed   By: Natasha Mead M.D.   On: 05/04/2016 14:48     Assessment/Plan Active Problems:   Obesity, morbid (more than 100 lbs over ideal weight or BMI > 40) (HCC)   Charcot ankle   Gastroenteritis   Diabetes (HCC)   Gastroenteritis -check GI pathogen panel -probably viral -given zosyn in ER-- cipro/flagyl in AM  Obesity Encouraged weight loss  Lactic acidosis -IVF and recheck in AM  charcot ankle -follow at [facility] and in the wound care center at [facility]  DM -hold home meds -SSI  hepatosteatosis -encourage weight loss  Fibroids -outpatient gyn   DVT prophylaxis: scd Code Status: full Family Communication: husband at bedside Disposition Plan: in AM Consults called: Admission status: obs   Ercelle Winkles Juanetta Gosling DO [facility] Pager [phone]  If 7PM-7AM, please contact night-coverage [email or web address] Password [email or web address]  05/04/2016, 5:25 PM

## 2016-05-04 NOTE — Progress Notes (Signed)
Patient Lactic acid 5.92. MD notified.

## 2016-05-04 NOTE — ED Notes (Signed)
Elevated CG-4 reported to Dr. Issacs and Pfieffer 

## 2016-05-05 ENCOUNTER — Encounter (HOSPITAL_COMMUNITY): Payer: Self-pay | Admitting: General Practice

## 2016-05-05 DIAGNOSIS — K529 Noninfective gastroenteritis and colitis, unspecified: Secondary | ICD-10-CM | POA: Diagnosis not present

## 2016-05-05 DIAGNOSIS — E1159 Type 2 diabetes mellitus with other circulatory complications: Secondary | ICD-10-CM | POA: Diagnosis not present

## 2016-05-05 DIAGNOSIS — E860 Dehydration: Secondary | ICD-10-CM

## 2016-05-05 DIAGNOSIS — E872 Acidosis, unspecified: Secondary | ICD-10-CM

## 2016-05-05 LAB — GASTROINTESTINAL PANEL BY PCR, STOOL (REPLACES STOOL CULTURE)
ADENOVIRUS F40/41: NOT DETECTED
ASTROVIRUS: NOT DETECTED
CRYPTOSPORIDIUM: NOT DETECTED
CYCLOSPORA CAYETANENSIS: NOT DETECTED
Campylobacter species: NOT DETECTED
ENTAMOEBA HISTOLYTICA: NOT DETECTED
ENTEROPATHOGENIC E COLI (EPEC): NOT DETECTED
ENTEROTOXIGENIC E COLI (ETEC): NOT DETECTED
Enteroaggregative E coli (EAEC): NOT DETECTED
Giardia lamblia: NOT DETECTED
Norovirus GI/GII: NOT DETECTED
Plesimonas shigelloides: NOT DETECTED
Rotavirus A: NOT DETECTED
SAPOVIRUS (I, II, IV, AND V): NOT DETECTED
Salmonella species: NOT DETECTED
Shiga like toxin producing E coli (STEC): NOT DETECTED
Shigella/Enteroinvasive E coli (EIEC): NOT DETECTED
VIBRIO SPECIES: NOT DETECTED
Vibrio cholerae: NOT DETECTED
YERSINIA ENTEROCOLITICA: NOT DETECTED

## 2016-05-05 LAB — BASIC METABOLIC PANEL
Anion gap: 10 (ref 5–15)
BUN: 12 mg/dL (ref 6–20)
CALCIUM: 6.9 mg/dL — AB (ref 8.9–10.3)
CO2: 19 mmol/L — ABNORMAL LOW (ref 22–32)
Chloride: 107 mmol/L (ref 101–111)
Creatinine, Ser: 0.84 mg/dL (ref 0.44–1.00)
GFR calc Af Amer: >60 mL/min (ref >60)
Glucose, Bld: 144 mg/dL — ABNORMAL HIGH (ref 65–99)
POTASSIUM: 3.9 mmol/L (ref 3.5–5.1)
SODIUM: 136 mmol/L (ref 135–145)

## 2016-05-05 LAB — LACTIC ACID, PLASMA
LACTIC ACID, VENOUS: 2.2 mmol/L — AB (ref 0.5–1.9)
Lactic Acid, Venous: 3.5 mmol/L (ref 0.5–1.9)

## 2016-05-05 LAB — CBC
HEMATOCRIT: 36.9 % (ref 36.0–46.0)
Hemoglobin: 12.3 g/dL (ref 12.0–15.0)
MCH: 29.4 pg (ref 26.0–34.0)
MCHC: 33.3 g/dL (ref 30.0–36.0)
MCV: 88.3 fL (ref 78.0–100.0)
PLATELETS: 254 10*3/uL (ref 150–400)
RBC: 4.18 MIL/uL (ref 3.87–5.11)
RDW: 14.9 % (ref 11.5–15.5)
WBC: 10 10*3/uL (ref 4.0–10.5)

## 2016-05-05 LAB — GLUCOSE, CAPILLARY
Glucose-Capillary: 152 mg/dL — ABNORMAL HIGH (ref 65–99)
Glucose-Capillary: 144 mg/dL — ABNORMAL HIGH (ref 65–99)

## 2016-05-05 MED ORDER — METRONIDAZOLE 500 MG PO TABS: 500.0000 mg | ORAL_TABLET | Freq: Three times a day (TID) | ORAL | 0 refills | Status: DC

## 2016-05-05 MED ORDER — CIPROFLOXACIN HCL 500 MG PO TABS: 500.0000 mg | ORAL_TABLET | Freq: Two times a day (BID) | ORAL | 0 refills | Status: DC

## 2016-05-05 MED ORDER — SODIUM CHLORIDE 0.9 % IV BOLUS (SEPSIS)
1000.0000 mL | Freq: Once | INTRAVENOUS | Status: AC
  Administered 2016-05-05: 1000 mL via INTRAVENOUS

## 2016-05-05 NOTE — Progress Notes (Signed)
Patient Lactic acid 3.7. MD notified New orders noted.

## 2016-05-05 NOTE — Progress Notes (Signed)
Patient Lactic acid 2.2. MD notified.

## 2016-05-05 NOTE — Progress Notes (Signed)
Patient Lactic acid 3.5. MD notified.

## 2016-05-05 NOTE — Progress Notes (Signed)
[name] to be D/C'd Home per MD order.  Discussed prescriptions and follow up appointments with the patient. Prescriptions given to patient, medication list explained in detail. Pt verbalized understanding.  Allergies as of 05/05/2016   No Known Allergies     Medication List    STOP taking these medications   amoxicillin-clavulanate 500-125 MG tablet Commonly known as:  AUGMENTIN   oxyCODONE-acetaminophen 5-325 MG tablet Commonly known as:  ROXICET     TAKE these medications   allopurinol 100 MG tablet Commonly known as:  ZYLOPRIM Take 100 mg by mouth every morning.   ciprofloxacin 500 MG tablet Commonly known as:  CIPRO Take 1 tablet (500 mg total) by mouth 2 (two) times daily.   diclofenac 75 MG EC tablet Commonly known as:  VOLTAREN Take 75 mg by mouth 2 (two) times daily.   DULoxetine 60 MG capsule Commonly known as:  CYMBALTA Take 60 mg by mouth daily.   Exenatide ER 2 MG Pen Inject 2 mg into the skin every 7 (seven) days.   glucosamine-chondroitin 500-400 MG tablet Take 2 tablets by mouth daily.   HYDROcodone-acetaminophen 10-325 MG tablet Commonly known as:  NORCO Take 1 tablet by mouth every 8 (eight) hours as needed for moderate pain or severe pain.   JANUMET XR 50-1000 MG Tb24 Generic drug:  SitaGLIPtin-MetFORMIN HCl Take 1 tablet by mouth at bedtime.   metroNIDAZOLE 500 MG tablet Commonly known as:  FLAGYL Take 1 tablet (500 mg total) by mouth every 8 (eight) hours.   mirabegron ER 50 MG Tb24 tablet Commonly known as:  MYRBETRIQ Take 50 mg by mouth every morning.   multivitamin with minerals Tabs tablet Take 1 tablet by mouth daily.   ondansetron 8 MG disintegrating tablet Commonly known as:  ZOFRAN-ODT Take 8 mg by mouth every 8 (eight) hours as needed for nausea/vomiting. DISSOLVE IN THE MOUTH       Vitals:   05/05/16 0604 05/05/16 0900  BP: (!) 112/46 127/68  Pulse: 93 82  Resp: 18 17  Temp: 98.2 F (36.8 C) 97.5 F (36.4  C)    Skin clean, dry and intact without evidence of skin break down, no evidence of skin tears noted. IV catheter discontinued intact. Site without signs and symptoms of complications. Dressing and pressure applied. Pt denies pain at this time. No complaints noted.  An After Visit Summary was printed and given to the patient. Patient escorted via WC, and D/C home via private auto.  Nelma RothmanNatalie Charlott Calvario, RN [facility] Phone [phone]

## 2016-05-05 NOTE — Discharge Summary (Addendum)
Physician Discharge Summary  [name] OZH:[id number] DOB: 07-14-1965 DOA: 05/04/2016  PCP: [name], MD  Admit date: 05/04/2016 Discharge date: 05/05/2016   Recommendations for Outpatient Follow-Up:   Cipro/flagyl x 7 days for enteritis viral panel negative Outpatient GYN follow up for fibroids   Discharge Diagnosis:   Active Problems:   Obesity, morbid (more than 100 lbs over ideal weight or BMI > 40) (HCC)   Charcot ankle   Gastroenteritis   Diabetes (HCC)   Lactic acidosis   Discharge disposition:  Home.  Discharge Condition: Improved.  Diet recommendation: Low sodium, heart healthy.  Carbohydrate-modified.  Regular.  Wound care: None.   History of Present Illness:   [name] is a 51 y.o. female with medical history significant of DM, sleep apnea, charcot joint who presents with an almost 1 week history of nausea developing into diarrhea and abdominal pain.  No sick contacts.  Appropriately saw PCP after first few days who gave zofran with some improvement in nausea but over the weekend, patient developed worsening nausea, vomiting and abdominal pain with liquid stools.      ED Course: In the ER, lactic acid was elevated and CT Scan showed a Small hiatal hernia with thickening of distal esophageal wall. Gastroesophageal reflux cannot be excluded.  Significant fatty infiltration of the liver.  Minimal fluid distended distal small bowel loops. Findings suspicious for enteritis. Less likely ileus or early bowel obstruction. Some liquid stool and gas noted within right colon and transverse colon. Diarrhea cannot be excluded. Normal appendix partially visualized. No pericecal inflammation. No evidence of colitis.  Low lying IUD within uterus. Question myometrial fibroids within uterus. Correlation with GYN exam and further correlation with pelvic ultrasound or MRI could be performed.  There is a right ovarian cyst measures 3.6 cm.    Hospital Course by  Problem:   Lactic acidosis due to dehydration -s/p multiple bags of NS with resolution -patient much improved  Enteritis -GI pathogen panel negative -cipro/flagyl x 7 days -much improved this AM and eating well -no recent abx use so doubt c diff  DM -resume home meds    Medical Consultants:    None.   Discharge Exam:   Vitals:   05/05/16 0604 05/05/16 0900  BP: (!) 112/46 127/68  Pulse: 93 82  Resp: 18 17  Temp: 98.2 F (36.8 C) 97.5 F (36.4 C)   Vitals:   05/04/16 1843 05/04/16 2135 05/05/16 0604 05/05/16 0900  BP: (!) 94/57 100/64 (!) 112/46 127/68  Pulse: (!) 105 89 93 82  Resp: 15 18 18 17   Temp: 98.5 F (36.9 C) 98.4 F (36.9 C) 98.2 F (36.8 C) 97.5 F (36.4 C)  TempSrc: Oral Oral Oral Oral  SpO2: 99% 97% 96% 98%  Weight:      Height:        Gen:  NAD    The results of significant diagnostics from this hospitalization (including imaging, microbiology, ancillary and laboratory) are listed below for reference.     Procedures and Diagnostic Studies:   Ct Abdomen Pelvis W Contrast  Result Date: 05/04/2016 CLINICAL DATA:  Lower abdominal pain starting Saturday, diarrhea, nausea and vomiting EXAM: CT ABDOMEN AND PELVIS WITH CONTRAST TECHNIQUE: Multidetector CT imaging of the abdomen and pelvis was performed using the standard protocol following bolus administration of intravenous contrast. CONTRAST:  ISOVUE-300 IOPAMIDOL (ISOVUE-300) INJECTION 61% COMPARISON:  None. FINDINGS: Lower chest: Lung bases are normal. There is a small hiatal hernia. Thickening of  distal esophageal wall. Gastroesophageal reflux cannot be excluded. Clinical correlation is necessary P Hepatobiliary: Fatty infiltration of the liver is noted. No calcified gallstones are noted within gallbladder. No focal hepatic mass. Pancreas: No focal pancreatic abnormality. No evidence of acute pancreatitis. Spleen: Enhanced spleen is normal. Adrenals/Urinary Tract: No adrenal gland mass.  Enhanced kidneys are symmetrical in size. No hydronephrosis or hydroureter. Delayed renal images shows shows bilateral mild delay excretion. Please correlate with renal function test. Stomach/Bowel: No gastric outlet obstruction. Mild fluid distended distal small bowel loops in mid abdomen and pelvis. Enteritis cannot be excluded. There is no transition point in caliber of small bowel. Less likely ileus or early bowel obstruction. Some colonic gas and fluid noted within right colon and transverse colon. Diarrhea cannot be excluded. Normal appendix partially visualized in axial image 69. Measures 4.4 mm in diameter. No pericecal inflammation. Some liquid stool noted within sigmoid colon and rectum. Diarrhea cannot be excluded. Vascular/Lymphatic: No aortic aneurysm.  No adenopathy. Reproductive: There is a low-lying IUD within uterus. There is question of a left fundal submucosal fibroid measure about 3 cm axial image 76. Probable fundal fibroid sagittal image 84 measures 1.3 cm. There might be a right fundal fibroid measures about 3 cm. Correlation with pelvic ultrasound MRI in GYN exam is recommended. Other: There is a right ovarian cyst measures 3.6 cm. No adnexal mass is noted. Musculoskeletal: No destructive bony lesions are noted. There are degenerative changes thoracolumbar spine. IMPRESSION: 1. Small hiatal hernia. There is thickening of distal esophageal wall. Gastroesophageal reflux cannot be excluded. 2. Significant fatty infiltration of the liver. 3. Question mild bilateral delayed renal excretion. Please correlate with renal function test. 4. Minimal fluid distended distal small bowel loops. Findings suspicious for enteritis. Less likely ileus or early bowel obstruction. Some liquid stool and gas noted within right colon and transverse colon. Diarrhea cannot be excluded. Normal appendix partially visualized. No pericecal inflammation. No evidence of colitis. 5. Low lying IUD within uterus. Question  myometrial fibroids within uterus. Correlation with GYN exam and further correlation with pelvic ultrasound or MRI could be performed. 6. There is a right ovarian cyst measures 3.6 cm. Electronically Signed   By: Natasha MeadLiviu  Pop M.D.   On: 05/04/2016 14:48     Labs:   Basic Metabolic Panel:  Recent Labs Lab 05/04/16 0936 05/05/16 0241  NA 136 136  K 5.0 3.9  CL 104 107  CO2 21* 19*  GLUCOSE 222* 144*  BUN 12 12  CREATININE 0.80 0.84  CALCIUM 8.8* 6.9*   GFR Estimated Creatinine Clearance: 105.1 mL/min (by C-G formula based on SCr of 0.84 mg/dL). Liver Function Tests:  Recent Labs Lab 05/04/16 0936  AST 20  ALT 18  ALKPHOS 74  BILITOT 0.4  PROT 6.6  ALBUMIN 3.1*    Recent Labs Lab 05/04/16 0936  LIPASE 25   No results for input(s): AMMONIA in the last 168 hours. Coagulation profile No results for input(s): INR, PROTIME in the last 168 hours.  CBC:  Recent Labs Lab 05/04/16 0936 05/05/16 0241  WBC 12.8* 10.0  HGB 16.6* 12.3  HCT 49.8* 36.9  MCV 89.2 88.3  PLT 352 254   Cardiac Enzymes: No results for input(s): CKTOTAL, CKMB, CKMBINDEX, TROPONINI in the last 168 hours. BNP: Invalid input(s): POCBNP CBG:  Recent Labs Lab 05/04/16 2133 05/05/16 0746 05/05/16 1224  GLUCAP 162* 152* 144*   D-Dimer No results for input(s): DDIMER in the last 72 hours. Hgb A1c No results for  input(s): HGBA1C in the last 72 hours. Lipid Profile No results for input(s): CHOL, HDL, LDLCALC, TRIG, CHOLHDL, LDLDIRECT in the last 72 hours. Thyroid function studies No results for input(s): TSH, T4TOTAL, T3FREE, THYROIDAB in the last 72 hours.  Invalid input(s): FREET3 Anemia work up No results for input(s): VITAMINB12, FOLATE, FERRITIN, TIBC, IRON, RETICCTPCT in the last 72 hours. Microbiology Recent Results (from the past 240 hour(s))  Blood culture (routine x 2)     Status: None (Preliminary result)   Collection Time: 05/04/16  4:28 PM  Result Value Ref Range Status     Specimen Description BLOOD RIGHT ANTECUBITAL  Final   Special Requests BOTTLES DRAWN AEROBIC AND ANAEROBIC 5CC  Final   Culture NO GROWTH < 24 HOURS  Final   Report Status PENDING  Incomplete  Blood culture (routine x 2)     Status: None (Preliminary result)   Collection Time: 05/04/16  4:33 PM  Result Value Ref Range Status   Specimen Description BLOOD RIGHT HAND  Final   Special Requests BOTTLES DRAWN AEROBIC AND ANAEROBIC 5CC  Final   Culture NO GROWTH < 24 HOURS  Final   Report Status PENDING  Incomplete  Gastrointestinal Panel by PCR , Stool     Status: None   Collection Time: 05/04/16  6:17 PM  Result Value Ref Range Status   Campylobacter species NOT DETECTED NOT DETECTED Final   Plesimonas shigelloides NOT DETECTED NOT DETECTED Final   Salmonella species NOT DETECTED NOT DETECTED Final   Yersinia enterocolitica NOT DETECTED NOT DETECTED Final   Vibrio species NOT DETECTED NOT DETECTED Final   Vibrio cholerae NOT DETECTED NOT DETECTED Final   Enteroaggregative E coli (EAEC) NOT DETECTED NOT DETECTED Final   Enteropathogenic E coli (EPEC) NOT DETECTED NOT DETECTED Final   Enterotoxigenic E coli (ETEC) NOT DETECTED NOT DETECTED Final   Shiga like toxin producing E coli (STEC) NOT DETECTED NOT DETECTED Final   Shigella/Enteroinvasive E coli (EIEC) NOT DETECTED NOT DETECTED Final   Cryptosporidium NOT DETECTED NOT DETECTED Final   Cyclospora cayetanensis NOT DETECTED NOT DETECTED Final   Entamoeba histolytica NOT DETECTED NOT DETECTED Final   Giardia lamblia NOT DETECTED NOT DETECTED Final   Adenovirus F40/41 NOT DETECTED NOT DETECTED Final   Astrovirus NOT DETECTED NOT DETECTED Final   Norovirus GI/GII NOT DETECTED NOT DETECTED Final   Rotavirus A NOT DETECTED NOT DETECTED Final   Sapovirus (I, II, IV, and V) NOT DETECTED NOT DETECTED Final     Discharge Instructions:   Discharge Instructions    Diet - low sodium heart healthy    Complete by:  As directed    Diet Carb  Modified    Complete by:  As directed    Discharge instructions    Complete by:  As directed    Be sure to drink plenty of fluids Return to ER/PCP if not improved Can use OTC lactobacillus   Increase activity slowly    Complete by:  As directed      Allergies as of 05/05/2016   No Known Allergies     Medication List    STOP taking these medications   amoxicillin-clavulanate 500-125 MG tablet Commonly known as:  AUGMENTIN   oxyCODONE-acetaminophen 5-325 MG tablet Commonly known as:  ROXICET     TAKE these medications   allopurinol 100 MG tablet Commonly known as:  ZYLOPRIM Take 100 mg by mouth every morning.   ciprofloxacin 500 MG tablet Commonly known as:  CIPRO Take  1 tablet (500 mg total) by mouth 2 (two) times daily.   diclofenac 75 MG EC tablet Commonly known as:  VOLTAREN Take 75 mg by mouth 2 (two) times daily.   DULoxetine 60 MG capsule Commonly known as:  CYMBALTA Take 60 mg by mouth daily.   Exenatide ER 2 MG Pen Inject 2 mg into the skin every 7 (seven) days.   glucosamine-chondroitin 500-400 MG tablet Take 2 tablets by mouth daily.   HYDROcodone-acetaminophen 10-325 MG tablet Commonly known as:  NORCO Take 1 tablet by mouth every 8 (eight) hours as needed for moderate pain or severe pain.   JANUMET XR 50-1000 MG Tb24 Generic drug:  SitaGLIPtin-MetFORMIN HCl Take 1 tablet by mouth at bedtime.   metroNIDAZOLE 500 MG tablet Commonly known as:  FLAGYL Take 1 tablet (500 mg total) by mouth every 8 (eight) hours.   mirabegron ER 50 MG Tb24 tablet Commonly known as:  MYRBETRIQ Take 50 mg by mouth every morning.   multivitamin with minerals Tabs tablet Take 1 tablet by mouth daily.   ondansetron 8 MG disintegrating tablet Commonly known as:  ZOFRAN-ODT Take 8 mg by mouth every 8 (eight) hours as needed for nausea/vomiting. DISSOLVE IN THE MOUTH      Follow-up Information    BADGER,MICHAEL C, MD Follow up in 1 week(s).   Specialty:  Family  Medicine Contact information: [address] [place] [address] [phone]            Time coordinating discharge: 35 min  Signed:  Anneke Cundy Juanetta Gosling   [facility] 05/05/2016, 3:32 PM

## 2016-05-06 DIAGNOSIS — E11621 Type 2 diabetes mellitus with foot ulcer: Secondary | ICD-10-CM | POA: Diagnosis not present

## 2016-05-09 LAB — CULTURE, BLOOD (ROUTINE X 2)
CULTURE: NO GROWTH
CULTURE: NO GROWTH

## 2016-05-16 DIAGNOSIS — E11621 Type 2 diabetes mellitus with foot ulcer: Secondary | ICD-10-CM | POA: Diagnosis not present

## 2016-05-27 DIAGNOSIS — E11621 Type 2 diabetes mellitus with foot ulcer: Secondary | ICD-10-CM | POA: Diagnosis not present

## 2016-06-03 ENCOUNTER — Encounter (HOSPITAL_BASED_OUTPATIENT_CLINIC_OR_DEPARTMENT_OTHER): Payer: BLUE CROSS/BLUE SHIELD | Attending: Surgery

## 2016-06-03 DIAGNOSIS — E11610 Type 2 diabetes mellitus with diabetic neuropathic arthropathy: Secondary | ICD-10-CM | POA: Insufficient documentation

## 2016-06-03 DIAGNOSIS — Z6841 Body Mass Index (BMI) 40.0 and over, adult: Secondary | ICD-10-CM | POA: Insufficient documentation

## 2016-06-03 DIAGNOSIS — G608 Other hereditary and idiopathic neuropathies: Secondary | ICD-10-CM | POA: Insufficient documentation

## 2016-06-03 DIAGNOSIS — E11621 Type 2 diabetes mellitus with foot ulcer: Secondary | ICD-10-CM | POA: Insufficient documentation

## 2016-06-03 DIAGNOSIS — L84 Corns and callosities: Secondary | ICD-10-CM | POA: Diagnosis not present

## 2016-06-03 DIAGNOSIS — G4730 Sleep apnea, unspecified: Secondary | ICD-10-CM | POA: Insufficient documentation

## 2016-06-03 DIAGNOSIS — E663 Overweight: Secondary | ICD-10-CM | POA: Diagnosis not present

## 2016-06-03 DIAGNOSIS — L97522 Non-pressure chronic ulcer of other part of left foot with fat layer exposed: Secondary | ICD-10-CM | POA: Diagnosis not present

## 2016-06-08 DIAGNOSIS — E1165 Type 2 diabetes mellitus with hyperglycemia: Secondary | ICD-10-CM

## 2016-06-08 DIAGNOSIS — E11618 Type 2 diabetes mellitus with other diabetic arthropathy: Secondary | ICD-10-CM | POA: Insufficient documentation

## 2016-06-08 DIAGNOSIS — IMO0002 Reserved for concepts with insufficient information to code with codable children: Secondary | ICD-10-CM | POA: Insufficient documentation

## 2016-06-10 DIAGNOSIS — E11621 Type 2 diabetes mellitus with foot ulcer: Secondary | ICD-10-CM | POA: Diagnosis not present

## 2016-06-17 DIAGNOSIS — E11621 Type 2 diabetes mellitus with foot ulcer: Secondary | ICD-10-CM | POA: Diagnosis not present

## 2016-06-24 DIAGNOSIS — E11621 Type 2 diabetes mellitus with foot ulcer: Secondary | ICD-10-CM | POA: Diagnosis not present

## 2016-07-01 ENCOUNTER — Encounter (HOSPITAL_BASED_OUTPATIENT_CLINIC_OR_DEPARTMENT_OTHER): Payer: BLUE CROSS/BLUE SHIELD | Attending: Surgery

## 2016-07-01 DIAGNOSIS — M109 Gout, unspecified: Secondary | ICD-10-CM | POA: Diagnosis not present

## 2016-07-01 DIAGNOSIS — L97522 Non-pressure chronic ulcer of other part of left foot with fat layer exposed: Secondary | ICD-10-CM | POA: Diagnosis not present

## 2016-07-01 DIAGNOSIS — G608 Other hereditary and idiopathic neuropathies: Secondary | ICD-10-CM | POA: Diagnosis not present

## 2016-07-01 DIAGNOSIS — G4730 Sleep apnea, unspecified: Secondary | ICD-10-CM | POA: Insufficient documentation

## 2016-07-01 DIAGNOSIS — D649 Anemia, unspecified: Secondary | ICD-10-CM | POA: Diagnosis not present

## 2016-07-01 DIAGNOSIS — E663 Overweight: Secondary | ICD-10-CM | POA: Insufficient documentation

## 2016-07-01 DIAGNOSIS — E11621 Type 2 diabetes mellitus with foot ulcer: Secondary | ICD-10-CM | POA: Insufficient documentation

## 2016-07-01 DIAGNOSIS — M14672 Charcot's joint, left ankle and foot: Secondary | ICD-10-CM | POA: Insufficient documentation

## 2016-07-01 DIAGNOSIS — E1140 Type 2 diabetes mellitus with diabetic neuropathy, unspecified: Secondary | ICD-10-CM | POA: Insufficient documentation

## 2016-07-01 DIAGNOSIS — L84 Corns and callosities: Secondary | ICD-10-CM | POA: Diagnosis not present

## 2016-07-01 DIAGNOSIS — Z6841 Body Mass Index (BMI) 40.0 and over, adult: Secondary | ICD-10-CM | POA: Insufficient documentation

## 2016-07-08 DIAGNOSIS — E11621 Type 2 diabetes mellitus with foot ulcer: Secondary | ICD-10-CM | POA: Diagnosis not present

## 2016-09-15 ENCOUNTER — Telehealth: Payer: Self-pay | Admitting: Infectious Diseases

## 2016-09-15 NOTE — Telephone Encounter (Signed)
[facility] infection with MSSA now.  Being [facility] on ancef Will see her in clinic as f/u

## 2016-09-18 NOTE — Telephone Encounter (Signed)
Patient called and advised she is waiting on appointment and wanted to know if her records from [facility] have been received. Advised her note sure about the labs but that the doctor has sent a message that she is to be scheduled and I will let the scheduler know to give her a call once they do this. She advised she gets injections every 2 weeks and she will be needing her Rx soon she she needs to get in rather quickly. Advised will relay that information. She is fine with that.

## 2016-09-22 ENCOUNTER — Ambulatory Visit (INDEPENDENT_AMBULATORY_CARE_PROVIDER_SITE_OTHER): Payer: BLUE CROSS/BLUE SHIELD | Admitting: Infectious Diseases

## 2016-09-22 ENCOUNTER — Encounter: Payer: Self-pay | Admitting: Infectious Diseases

## 2016-09-22 DIAGNOSIS — T8450XA Infection and inflammatory reaction due to unspecified internal joint prosthesis, initial encounter: Secondary | ICD-10-CM

## 2016-09-22 NOTE — Progress Notes (Signed)
Subjective:    Patient ID: [name], female    DOB: 1965-10-09, 51 y.o.   MRN: [id number]  HPI 51 yo F with a history of type 2 diabetes, obesity, left total knee replacement 2007, right total knee replacement 2012. She came to [facility] on January 30, 2013 with two days of right knee pain, fever, sweats, and hot flashes. In the emergency room her temperature was 100.8. She had been seen previously in her orthopedist office and had an arthrocentesis. In hospital she was started on vancomycin. On October 7, she underwent irrigation and debridement of her right total knee replacement with exchange of parts. Cipro was added to her antibiotic regimen on October 8. She was found to have group B strep in her culture from her outpatient aspiration. On October 9 her antibiotics were changed to ceftriaxone 2 g once daily and she was discharged home. 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 CRP 31.7. ESR 113.  She was seen in ID f/u on 02-15-13 and was doing well. Repeat ESR 79 and CRP 6.3. ESR 34 and CRP 1.9 (03-14-13).  She was seen in ID f/u on 03-14-13 and her IV anbx were stopped, and she was started on levaquin.  At her f/u 06-13-13 she had ESR 23 and CRP 1.7.  CRP 1.3 and ESR 14 (July 2015).  Has seen Dr Sharol Given due to feet collapsing. Has been experimenting with inserts, planned for L foot surgery 03-17-14.  Was planned to stop her anbx at 1 year (Oct 2015) however at ID f/u on 11-17 was continued on anbx through her foot surgery.   After L foot surgery 01-25-15 (5 bones removed, rod from great toe to ankle), she had fevers. She had 2nd anbx added. She had repeat aspirate of her L knee that was clear. She had Cx sent from her foot that  Was (-). She was seen by ID on 06-13-14 and was started on levaquin.  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  She was given amox-clav on 01-04-15 and then no f/u with ID.   She had further repair of her ankle on  July 10, 2016.   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 She was started on vanco/cefepime on 5-18. She had debridement and partial hardware removal 09-14-16. She had plastics eval as well as skin graft. She was d/c home on ancef and rifampin.  She is uncelar of her anbx stop date.  No problems with anbx  Last A1C 7.2% Wt down 25#, has not "walked in 3 years" No problems with PIC line.   Review of Systems  Constitutional: Negative for appetite change, chills, fever and unexpected weight change.  Eyes: Negative for visual disturbance.  Gastrointestinal: Negative for constipation and diarrhea.  Genitourinary: Negative for difficulty urinating.  4 mos non-wt bearing Has f/u at [facility] and ortho.      Objective:   Physical Exam  Constitutional: She appears well-developed and well-nourished.  HENT:  Mouth/Throat: No oropharyngeal exudate.  Eyes: EOM are normal. Pupils are equal, round, and reactive to light.  Neck: Neck supple.  Cardiovascular: Normal rate, regular rhythm and normal heart sounds.   Pulmonary/Chest: Effort normal and breath sounds normal.  Abdominal: Soft. Bowel sounds are normal. There is no tenderness. There is no rebound.  Musculoskeletal: She exhibits no edema.       Arms:      Feet:  Lymphadenopathy:    She has no cervical adenopathy.       Assessment & Plan:

## 2016-09-22 NOTE — Assessment & Plan Note (Signed)
She appears to be doing well.  Dressing not taken down, was placed today.  Spoke with pharm, we will get her labs. Determine her stop date. Determine PIC removal date.  Will see her back in 1 month.

## 2016-09-22 NOTE — Assessment & Plan Note (Signed)
Has lost 25#, I encouraged her.

## 2016-09-23 ENCOUNTER — Telehealth: Payer: Self-pay | Admitting: Pharmacist

## 2016-09-23 NOTE — Telephone Encounter (Signed)
[facility] and nursing/PICC service through [facility].  Called and spoke to RN there.  They were just given time of "6 weeks" with no specific stop date.  First visit with her for them was 09/19/16. There are no pull PICC orders either.  6 weeks from then would be 7/7.  Pt has f/u with Dr. Ninetta LightsHatcher again on 6/25. If IV abx are to be extended, they will need to know - [phone], as well as [facility]. They will need pull PICC orders at that time as well. I am receiving labs on her and that is all in place.

## 2016-09-23 NOTE — Telephone Encounter (Signed)
That sounds fine Will decide anbx end when she follows up thanks

## 2016-09-30 ENCOUNTER — Encounter: Payer: Self-pay | Admitting: Infectious Diseases

## 2016-10-19 ENCOUNTER — Encounter: Payer: Self-pay | Admitting: Infectious Diseases

## 2016-10-19 ENCOUNTER — Ambulatory Visit (INDEPENDENT_AMBULATORY_CARE_PROVIDER_SITE_OTHER): Payer: BLUE CROSS/BLUE SHIELD | Admitting: Infectious Diseases

## 2016-10-19 DIAGNOSIS — E1159 Type 2 diabetes mellitus with other circulatory complications: Secondary | ICD-10-CM | POA: Diagnosis not present

## 2016-10-19 DIAGNOSIS — T8450XD Infection and inflammatory reaction due to unspecified internal joint prosthesis, subsequent encounter: Secondary | ICD-10-CM | POA: Diagnosis not present

## 2016-10-19 NOTE — Assessment & Plan Note (Signed)
I'm not clear of her control She will need to f/u with PCP.

## 2016-10-19 NOTE — Assessment & Plan Note (Signed)
She appears to be doing well I am concerned that she still has elevated inflammatory markers.  She still has open wound/graft although she is clear that it has minimal d/c.  Will extend her anbx to 12 weeks IV and then plan to change to po.  IV end date 12-08-16. Will see her back around 12-08-16

## 2016-10-19 NOTE — Progress Notes (Signed)
Subjective:    Patient ID: [name], female    DOB: 28-Feb-1966, 51 y.o.   MRN: [id number]  HPI 51 yo F with a history of type 2 diabetes, obesity, left total knee replacement 2007, right total knee replacement 2012. She came to [facility] on January 30, 2013 with two days of right knee pain, fever, sweats, and hot flashes. In the emergency room her temperature was 100.8. She had been seen previously in her orthopedist office and had an arthrocentesis. In hospital she was started on vancomycin. On October 7, she underwent irrigation and debridement of her right total knee replacement with exchange of parts. Cipro was added to her antibiotic regimen on October 8. She was found to have group B strep in her culture from her outpatient aspiration. On October 9 her antibiotics were changed to ceftriaxone 2 g once daily and she was discharged home. 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 CRP 31.7. ESR 113.  She was seen in ID f/u on 02-15-13 and was doing well. Repeat ESR 79 and CRP 6.3. ESR 34 and CRP 1.9 (03-14-13).  She was seen in ID f/u on 03-14-13 and her IV anbx were stopped, and she was started on levaquin.  At her f/u 06-13-13 she had ESR 23 and CRP 1.7.  CRP 1.3 and ESR 14 (July 2015).  Has seen Dr Sharol Given due to feet collapsing. Has been experimenting with inserts, planned for L foot surgery 03-17-14.  Was planned to stop her anbx at 1 year (Oct 2015) however at ID f/u on 11-17 was continued on anbx through her foot surgery.   After L foot surgery 01-25-15 (5 bones removed, rod from great toe to ankle), she had fevers. She had 2nd anbx added. She had repeat aspirate of her L knee that was clear. She had Cx sent from her foot that Was (-). She was seen by ID on 06-13-14 and was started on levaquin.  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  She was given amox-clav on 01-04-15 and then no f/u with ID.   She had further repair of her ankle on  July 10, 2016.   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 She was started on vanco/cefepime on 5-18. She had debridement and partial hardware removal 09-14-16. She had plastics eval as well as skin graft. She was d/c home on ancef and rifampin.   Foot has been doing well, has had ortho and plastics eval.  Wound is open but still has some plastics dressing over it.  No problems with ancef/rifampin except for sunburn.   Today is day 35 of anbx   Does not check FSG.    10-13-16 09-30-16  5-29 ESR 12   36  27  CRP  16  18.5  18.3  Review of Systems  Constitutional: Negative for appetite change, chills, fever and unexpected weight change.  Gastrointestinal: Negative for constipation and diarrhea.  Genitourinary: Negative for difficulty urinating.  orange-yellow stool/urine. No scleral icterus.  Wt has stabilized.  No problems with PIC- no redness, no discharge, draws blood well.     Objective:   Physical Exam  Constitutional: She appears well-developed and well-nourished.  HENT:  Mouth/Throat: No oropharyngeal exudate.  Eyes: EOM are normal. Pupils are equal, round, and reactive to light. No  scleral icterus.  Neck: Neck supple.  Cardiovascular: Normal rate, regular rhythm and normal heart sounds.   Pulmonary/Chest: Effort normal and breath sounds normal.  Abdominal: Soft. Bowel sounds are normal. There is no tenderness. There is no rebound.  Musculoskeletal:       Arms:      Legs: Lymphadenopathy:    She has no cervical adenopathy.  Neurological: A sensory deficit is present.       Assessment & Plan:

## 2016-10-20 ENCOUNTER — Encounter: Payer: Self-pay | Admitting: Infectious Diseases

## 2016-10-20 ENCOUNTER — Telehealth: Payer: Self-pay

## 2016-10-20 NOTE — Telephone Encounter (Signed)
[name] with Hospice is calling for information related to IV antibiotics. Her initial dosing was to end on 10-22-16.  Should IV access be discontinued and what is the plan?

## 2016-10-27 ENCOUNTER — Telehealth: Payer: Self-pay | Admitting: *Deleted

## 2016-10-27 DIAGNOSIS — T8450XD Infection and inflammatory reaction due to unspecified internal joint prosthesis, subsequent encounter: Secondary | ICD-10-CM

## 2016-10-27 MED ORDER — RIFAMPIN 150 MG PO CAPS: 450.0000 mg | ORAL_CAPSULE | Freq: Two times a day (BID) | ORAL | 0 refills | Status: DC

## 2016-10-27 NOTE — Telephone Encounter (Signed)
Patient called about oral antibiotic.  Does Rifampin need extended?  RN received a verbal order from Dr. Ninetta LightsHatcher to extend the patient oral rifampin until the same end date as her IV antibiotic, 12/08/16.  Notified patient that medication will be ready for her pick up that Dr. Ninetta LightsHatcher did extend the Rifampin until 12/08/16.  Patient verbalized understanding.

## 2016-10-28 ENCOUNTER — Encounter: Payer: Self-pay | Admitting: Infectious Diseases

## 2016-11-03 ENCOUNTER — Other Ambulatory Visit (HOSPITAL_COMMUNITY)
Admission: RE | Admit: 2016-11-03 | Discharge: 2016-11-03 | Disposition: A | Discharge status: Home / Self Care | Payer: BLUE CROSS/BLUE SHIELD | Source: Other Acute Inpatient Hospital | Attending: Family Medicine | Admitting: Family Medicine

## 2016-11-03 DIAGNOSIS — S91302A Unspecified open wound, left foot, initial encounter: Secondary | ICD-10-CM | POA: Insufficient documentation

## 2016-11-03 LAB — COMPREHENSIVE METABOLIC PANEL
ALBUMIN: 3.7 g/dL (ref 3.5–5.0)
ALT: 11 U/L — ABNORMAL LOW (ref 14–54)
AST: 23 U/L (ref 15–41)
Alkaline Phosphatase: 92 U/L (ref 38–126)
Anion gap: 17 — ABNORMAL HIGH (ref 5–15)
BILIRUBIN TOTAL: 0.6 mg/dL (ref 0.3–1.2)
BUN: 11 mg/dL (ref 6–20)
CHLORIDE: 92 mmol/L — AB (ref 101–111)
CO2: 27 mmol/L (ref 22–32)
Calcium: 9.5 mg/dL (ref 8.9–10.3)
Creatinine, Ser: 0.54 mg/dL (ref 0.44–1.00)
GFR calc Af Amer: >60 mL/min (ref >60)
GFR calc non Af Amer: >60 mL/min (ref >60)
GLUCOSE: 365 mg/dL — AB (ref 65–99)
POTASSIUM: 3.9 mmol/L (ref 3.5–5.1)
SODIUM: 136 mmol/L (ref 135–145)
Total Protein: 6.8 g/dL (ref 6.5–8.1)

## 2016-11-03 LAB — CBC WITH DIFFERENTIAL/PLATELET
Basophils Absolute: 0 10*3/uL (ref 0.0–0.1)
Basophils Relative: 0 %
EOS ABS: 0.2 10*3/uL (ref 0.0–0.7)
EOS PCT: 3 %
HCT: 35.6 % — ABNORMAL LOW (ref 36.0–46.0)
Hemoglobin: 11.9 g/dL — ABNORMAL LOW (ref 12.0–15.0)
Lymphocytes Relative: 25 %
Lymphs Abs: 1.2 10*3/uL (ref 0.7–4.0)
MCH: 29.2 pg (ref 26.0–34.0)
MCHC: 33.4 g/dL (ref 30.0–36.0)
MCV: 87.3 fL (ref 78.0–100.0)
MONOS PCT: 8 %
Monocytes Absolute: 0.4 10*3/uL (ref 0.1–1.0)
Neutro Abs: 3.2 10*3/uL (ref 1.7–7.7)
Neutrophils Relative %: 64 %
PLATELETS: 205 10*3/uL (ref 150–400)
RBC: 4.08 MIL/uL (ref 3.87–5.11)
RDW: 15.4 % (ref 11.5–15.5)
WBC: 4.9 10*3/uL (ref 4.0–10.5)

## 2016-11-03 LAB — SEDIMENTATION RATE: Sed Rate: 3 mm/hr (ref 0–22)

## 2016-11-03 LAB — C-REACTIVE PROTEIN: CRP: 2.7 mg/dL — ABNORMAL HIGH (ref <1.0)

## 2016-11-18 ENCOUNTER — Other Ambulatory Visit: Payer: Self-pay | Admitting: Pharmacist

## 2016-11-24 ENCOUNTER — Encounter: Payer: Self-pay | Admitting: Infectious Diseases

## 2016-11-25 ENCOUNTER — Other Ambulatory Visit: Payer: Self-pay | Admitting: Pharmacist

## 2016-11-30 ENCOUNTER — Telehealth: Payer: Self-pay | Admitting: *Deleted

## 2016-11-30 NOTE — Telephone Encounter (Signed)
Patient called stating she is out of the Rifampin and her picc line is suppose to be in for another week. Her f/u appt is 12/09/16. Please advise

## 2016-12-01 NOTE — Telephone Encounter (Signed)
Please refill rifampin til seen on 8-15 thanks

## 2016-12-01 NOTE — Telephone Encounter (Addendum)
Patient called again to see about her oral antibiotic. [facility]  [facility] were unable to fill rifampin, [facility]. [facility] will allow her to pick up the rest today. Andree CossHowell, Lotus Santillo M, RN

## 2016-12-07 ENCOUNTER — Other Ambulatory Visit: Payer: Self-pay | Admitting: Pharmacist

## 2016-12-07 ENCOUNTER — Ambulatory Visit: Payer: BLUE CROSS/BLUE SHIELD | Admitting: Infectious Diseases

## 2016-12-08 ENCOUNTER — Telehealth: Payer: Self-pay | Admitting: *Deleted

## 2016-12-08 NOTE — Telephone Encounter (Signed)
Jess from [facility] called and advised that the patient is done with IV medication and wants to know if she should D/C the PICC. Advised the patient has an appointment tomorrow 12/09/16 and the doctor will make a decision at that time. She advised to give her a call once patient is seen.  Sharlynn OliphantJess, RN [phone] [facility]

## 2016-12-09 ENCOUNTER — Ambulatory Visit (INDEPENDENT_AMBULATORY_CARE_PROVIDER_SITE_OTHER): Payer: BLUE CROSS/BLUE SHIELD | Admitting: Infectious Diseases

## 2016-12-09 ENCOUNTER — Encounter: Payer: Self-pay | Admitting: Infectious Diseases

## 2016-12-09 DIAGNOSIS — E118 Type 2 diabetes mellitus with unspecified complications: Secondary | ICD-10-CM | POA: Diagnosis not present

## 2016-12-09 DIAGNOSIS — T8450XD Infection and inflammatory reaction due to unspecified internal joint prosthesis, subsequent encounter: Secondary | ICD-10-CM | POA: Diagnosis not present

## 2016-12-09 MED ORDER — CEPHALEXIN 500 MG PO CAPS: 500.0000 mg | ORAL_CAPSULE | Freq: Two times a day (BID) | ORAL | 3 refills | Status: DC

## 2016-12-09 NOTE — Progress Notes (Signed)
Subjective:    Patient ID: [name], female    DOB: November 26, 1965, 51 y.o.   MRN: [id number]  HPI 51yo F with a history of type 2 diabetes, obesity, left total knee replacement 2007, right total knee replacement 2012. She came to [facility] on January 30, 2013 with two days of right knee pain, fever, sweats, and hot flashes. In the emergency room her temperature was 100.8. She had been seen previously in her orthopedist office and had an arthrocentesis. In hospital she was started on vancomycin. On October 7, she underwent irrigation and debridement of her right total knee replacement with exchange of parts. Cipro was added to her antibiotic regimen on October 8. She was found to have group B strep in her culture from her outpatient aspiration. On October 9 her antibiotics were changed to ceftriaxone 2 g once daily and she was discharged home. 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 CRP 31.7. ESR 113.  She was seen in ID f/u on 02-15-13 and was doing well. Repeat ESR 79 and CRP 6.3. ESR 34 and CRP 1.9 (03-14-13).  She was seen in ID f/u on 03-14-13 and her IV anbx were stopped, and she was started on levaquin.   Was planned to stop her anbx at 1 year (Oct 2015) however at ID f/u on 11-17 was continued on anbx through her foot surgery.   After L foot surgery 01-25-15 (5 bones removed, rod from great toe to ankle), she had fevers. She had 2nd anbx added. She had repeat aspirate of her L knee that was clear. She had Cx sent from her foot that Was (-). She was seen by ID on 06-13-14 and was started on levaquin.  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  She was given amox-clav on 01-04-15 and then no f/u with ID.   She had further repair of her ankle on July 10, 2016.   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 She was started on vanco/cefepime on  5-18. She had debridement and partial hardware removal 09-14-16. She had plastics eval as well as skin graft. She was d/c home on ancef and rifampin.   She was seen in ID on 6-25 and was noted to have continued issues with wound. Her anbx were extended to 8-14.                11-24-16 10-13-16            09-30-16              5-29 ESR     2  12                     36                   27         CRP     21.3  16                    18.5                 18.3  She has been released by plastics. Her wound has healed. Is getting a "crow walker boot" from ortho so she can go back to full "stride".  Wt  bearing as tolerated currently.  FSG have been "not good". A1C was 10.2% most recently. Under stress with family, her health, being more sedentary. Going to add glipizide. Worried that stress is contributing.   Review of Systems  Constitutional: Negative for appetite change, chills, fever and unexpected weight change.  Gastrointestinal: Negative for constipation and diarrhea.  Genitourinary: Negative for difficulty urinating.   No problems with PIC.  Has 2 more days of rifampin, due to pharm issue missed 2 days.  She has new wound on lateral foot after her boot rubbed her foot. Started as "blood blister"     Objective:   Physical Exam  Constitutional: She appears well-developed.  Eyes: Pupils are equal, round, and reactive to light. EOM are normal.  Neck: Neck supple.  Cardiovascular: Normal rate, regular rhythm and normal heart sounds.   Pulmonary/Chest: Effort normal and breath sounds normal.  Abdominal: Soft. Bowel sounds are normal. There is no tenderness. There is no rebound.  Musculoskeletal: She exhibits no edema.  Lymphadenopathy:    She has no cervical adenopathy.  Psychiatric: She has a normal mood and affect.   L UE- PIC is clean, non-tender, no erythema or d/c.  LLE- superficial wounds. Clean, no d/c, no erythema, non-tender.         Assessment & Plan:

## 2016-12-09 NOTE — Assessment & Plan Note (Signed)
Continues to have poor control Hopefully becoming more active will help. Also decreasing personal stressors.

## 2016-12-09 NOTE — Assessment & Plan Note (Signed)
She appears to be doing well.  Will pull pic Will transition her to po keflex. Stop rifampin.  Will check her ESR and CRP.  Will see her back in 4 months.

## 2016-12-10 LAB — C-REACTIVE PROTEIN: CRP: 21.2 mg/L — ABNORMAL HIGH (ref <8.0)

## 2016-12-10 LAB — SEDIMENTATION RATE: Sed Rate: 6 mm/hr (ref 0–30)

## 2017-04-12 ENCOUNTER — Ambulatory Visit (INDEPENDENT_AMBULATORY_CARE_PROVIDER_SITE_OTHER): Payer: BLUE CROSS/BLUE SHIELD | Admitting: Infectious Diseases

## 2017-04-12 DIAGNOSIS — T8450XD Infection and inflammatory reaction due to unspecified internal joint prosthesis, subsequent encounter: Secondary | ICD-10-CM

## 2017-04-12 NOTE — Progress Notes (Signed)
Pt no showed, will check her labs when available.

## 2017-05-26 ENCOUNTER — Telehealth: Payer: Self-pay

## 2017-05-26 NOTE — Telephone Encounter (Signed)
Patient is calling with complaints of finger pain with burning for 2 days prior to having pus drainage today. She is not sure if there is fever since her thermometers have separate readings which were below normal.  She is concerned she may have a returning infection .  Patient was scheduled to see Marcos EkeGreg Calone, NP on tomorrow.    Laurell Josephsammy K Tevis Dunavan, RN

## 2017-05-27 ENCOUNTER — Ambulatory Visit
Admission: RE | Admit: 2017-05-27 | Discharge: 2017-05-27 | Disposition: A | Discharge status: Home / Self Care | Payer: BLUE CROSS/BLUE SHIELD | Source: Ambulatory Visit | Attending: Family | Admitting: Family

## 2017-05-27 ENCOUNTER — Encounter: Payer: Self-pay | Admitting: Family

## 2017-05-27 ENCOUNTER — Ambulatory Visit: Payer: BLUE CROSS/BLUE SHIELD | Admitting: Family

## 2017-05-27 VITALS — BP 129/89 | HR 108 | Temp 98.2°F | Wt 288.0 lb

## 2017-05-27 DIAGNOSIS — S61209A Unspecified open wound of unspecified finger without damage to nail, initial encounter: Secondary | ICD-10-CM

## 2017-05-27 MED ORDER — AMOXICILLIN-POT CLAVULANATE 875-125 MG PO TABS: 1.0000 | ORAL_TABLET | Freq: Two times a day (BID) | ORAL | 0 refills | Status: DC

## 2017-05-27 NOTE — Progress Notes (Signed)
Subjective:    Patient ID: [name], female    DOB: 1966/02/08, 52 y.o.   MRN: [id number]  Chief Complaint  Patient presents with  . New Patient (Initial Visit)     HPI:  [name] is a 52 y.o. female who presents today for a possible finger infection.     [name] is a patient previously seen by Dr. Johnnye Sima for prosthetic joint infection located in her foot/ankle. This is a new problem. Associated symptoms of a wound located on her right middle finger occurred when she sustained a burn that started with a blister around Thanksgiving. She has neuropathy and noted that she has been having continued pain. Modifying factors include neosporin and basic wound care which have not helped very much. She accidentally hit in on a surface and noted significant pus. She continues to have pus and notes some red streaky lines.    No Known Allergies    Outpatient Medications Prior to Visit  Medication Sig Dispense Refill  . allopurinol (ZYLOPRIM) 100 MG tablet Take by mouth.    . cephALEXin (KEFLEX) 500 MG capsule Take 1 capsule (500 mg total) by mouth 2 (two) times daily. 120 capsule 3  . diclofenac (VOLTAREN) 75 MG EC tablet Take 75 mg by mouth 2 (two) times daily.   9  . DULoxetine (CYMBALTA) 60 MG capsule Take 60 mg by mouth daily.    . Exenatide ER 2 MG PEN Inject 2 mg into the skin every 7 (seven) days.    Marland Kitchen glipiZIDE (GLUCOTROL XL) 10 MG 24 hr tablet   5  . glucosamine-chondroitin 500-400 MG tablet Take 2 tablets by mouth daily.     Marland Kitchen HYDROcodone-acetaminophen (NORCO) 10-325 MG tablet Take by mouth.    . mirabegron ER (MYRBETRIQ) 50 MG TB24 tablet Take 50 mg by mouth every morning.    . Multiple Vitamin (MULTIVITAMIN WITH MINERALS) TABS Take 1 tablet by mouth daily.    . ondansetron (ZOFRAN-ODT) 8 MG disintegrating tablet Take 8 mg by mouth every 8 (eight) hours as needed for nausea/vomiting. DISSOLVE IN THE MOUTH    . SitaGLIPtin-MetFORMIN HCl (JANUMET XR) 50-1000 MG  TB24 Take 1 tablet by mouth at bedtime.    Marland Kitchen VITAMIN A PO Take 4,800 mg by mouth daily.    . metroNIDAZOLE (FLAGYL) 500 MG tablet Take 1 tablet (500 mg total) by mouth every 8 (eight) hours. (Patient not taking: Reported on 05/27/2017) 21 tablet 0   No facility-administered medications prior to visit.      Past Medical History:  Diagnosis Date  . Arthritis   . Diabetes mellitus without complication ([place])   . Gastroenteritis 04/2016  . Headache(784.0)    hormonal headache  . Neuropathy    bilateral feet  . Obese   . Pneumonia    hx 20+ years ago  . Sleep apnea    bipap 1 yr  . Ulcer of foot ([place])    toe ulcer greater than a year ago     Past Surgical History:  Procedure Laterality Date  . ANKLE FUSION Left 08/03/2014   Procedure: Left Tibiocalcaneal Fusion;  Surgeon: Newt Minion, MD;  Location: [place];  Service: Orthopedics;  Laterality: Left;  . APPLICATION OF WOUND VAC Left 08/03/2014   Procedure: APPLICATION OF WOUND VAC;  Surgeon: Newt Minion, MD;  Location: [place];  Service: Orthopedics;  Laterality: Left;  . I&D KNEE WITH POLY EXCHANGE Right 01/31/2013   Procedure: IRRIGATION AND DEBRIDEMENT  KNEE WITH POLY EXCHANGE possible Antibiotic Spacer;  Surgeon: Garald Balding, MD;  Location: [place];  Service: Orthopedics;  Laterality: Right;  . INCISION AND DRAINAGE Right 01/31/2013   ANTIBIOTIC SPACER  RIGHT KNEE  DR Durward Fortes   . KNEE ARTHROSCOPY     left knee  . ORIF ANKLE FRACTURE Left 03/16/2014   Procedure: Foot Excision Charcot Collapse,  Internal Fixation;  Surgeon: Newt Minion, MD;  Location: [place];  Service: Orthopedics;  Laterality: Left;  . REPLACEMENT TOTAL KNEE     left  . TOTAL KNEE ARTHROPLASTY  06/23/2011   Procedure: TOTAL KNEE ARTHROPLASTY;  Surgeon: Garald Balding, MD;  Location: [place];  Service: Orthopedics;  Laterality: Right;  RIGHT TOTAL KNEE REPLACEMENT     Review of Systems  Constitutional: Positive for chills. Negative for fever.  Respiratory:  Negative for chest tightness, shortness of breath and wheezing.   Cardiovascular: Negative for chest pain.  Skin: Positive for wound.  Neurological: Negative for dizziness and weakness.      Objective:    BP 129/89   Pulse (!) 108   Temp 98.2 F (36.8 C) (Oral)   Wt 288 lb (130.6 kg)   BMI 49.44 kg/m  Nursing note and vital signs reviewed.  Physical Exam  Constitutional: She is oriented to person, place, and time. She appears well-developed and well-nourished. No distress.  Cardiovascular: Normal rate, regular rhythm, normal heart sounds and intact distal pulses. Exam reveals no gallop and no friction rub.  No murmur heard. Pulmonary/Chest: Effort normal and breath sounds normal. No respiratory distress. She has no wheezes. She has no rales. She exhibits no tenderness.  Neurological: She is alert and oriented to person, place, and time.  Skin: Skin is warm and dry.  Wound located on the distal aspect of the right 3rd finger appears like a hematoma. Able to express primarily serous discharge with some blood and rare purulence.Swab obtained.   Psychiatric: She has a normal mood and affect. Her behavior is normal. Judgment and thought content normal.          Assessment & Plan:   Problem List Items Addressed This Visit      Other   Open wnd of finger - Primary    Complicated wound healing of previous burn/hematoma while taking Keflex. Significant amount of serosangenous drainage, but no significant purulence. She is on Keflex maintenance for her previous MSSA infection. I will change her temporarily to Augmentin to add coverage of anaerobes. We will check her CRP, ESR and CBC. Will obtain x-ray to rule out osteomyelitis. She is at continued high risk for poor wound healing secondary to her diabetes and neuropathy. Follow up will be dependent up testing results. Consider wound care consult if no osteomyelitis is present.       Relevant Orders   C-reactive protein    Sedimentation rate   CBC w/Diff   DG Finger Middle Right      I am having [name] start on amoxicillin-clavulanate. I am also having her maintain her multivitamin with minerals, DULoxetine, glucosamine-chondroitin, diclofenac, Exenatide ER, ondansetron, SitaGLIPtin-MetFORMIN HCl, mirabegron ER, metroNIDAZOLE, VITAMIN A PO, allopurinol, HYDROcodone-acetaminophen, cephALEXin, and glipiZIDE.   Meds ordered this encounter  Medications  . amoxicillin-clavulanate (AUGMENTIN) 875-125 MG tablet    Sig: Take 1 tablet by mouth 2 (two) times daily.    Dispense:  14 tablet    Refill:  0    Order Specific Question:   Supervising Provider  Answer:   Carlyle Basques [[id number]]     Follow-up: Pending blood work and imaging results.   Terri Piedra, MSN, [facility]

## 2017-05-27 NOTE — Patient Instructions (Addendum)
Please STOP taking the Keflex and START taking Augmentin.   We will check your blood work, cultures, and get an x-ray to rule out osteomyelitis.   Continue basic wound care - if there is no improvement may recommend a follow up with the wound center.

## 2017-05-27 NOTE — Assessment & Plan Note (Addendum)
Complicated wound healing of previous burn/hematoma while taking Keflex. Significant amount of serosangenous drainage, but no significant purulence. She is on Keflex maintenance for her previous MSSA infection. I will change her temporarily to Augmentin to add coverage of anaerobes. We will check her CRP, ESR and CBC. Will obtain x-ray to rule out osteomyelitis. She is at continued high risk for poor wound healing secondary to her diabetes and neuropathy. Follow up will be dependent up testing results. Consider wound care consult if no osteomyelitis is present.

## 2017-05-28 LAB — CBC WITH DIFFERENTIAL/PLATELET
BASOS PCT: 0.3 %
Basophils Absolute: 17 cells/uL (ref 0–200)
EOS ABS: 139 {cells}/uL (ref 15–500)
Eosinophils Relative: 2.4 %
HEMATOCRIT: 35.1 % (ref 35.0–45.0)
HEMOGLOBIN: 11.5 g/dL — AB (ref 11.7–15.5)
LYMPHS ABS: 1688 {cells}/uL (ref 850–3900)
MCH: 28.6 pg (ref 27.0–33.0)
MCHC: 32.8 g/dL (ref 32.0–36.0)
MCV: 87.3 fL (ref 80.0–100.0)
MPV: 10.4 fL (ref 7.5–12.5)
Monocytes Relative: 7.2 %
NEUTROS ABS: 3538 {cells}/uL (ref 1500–7800)
Neutrophils Relative %: 61 %
PLATELETS: 317 10*3/uL (ref 140–400)
RBC: 4.02 10*6/uL (ref 3.80–5.10)
RDW: 13.8 % (ref 11.0–15.0)
Total Lymphocyte: 29.1 %
WBC mixed population: 418 cells/uL (ref 200–950)
WBC: 5.8 10*3/uL (ref 3.8–10.8)

## 2017-05-28 LAB — C-REACTIVE PROTEIN: CRP: 31.8 mg/L — ABNORMAL HIGH (ref <8.0)

## 2017-05-28 LAB — SEDIMENTATION RATE: Sed Rate: 45 mm/h — ABNORMAL HIGH (ref 0–30)

## 2017-05-31 ENCOUNTER — Other Ambulatory Visit: Payer: Self-pay | Admitting: Family

## 2017-05-31 ENCOUNTER — Other Ambulatory Visit: Payer: Self-pay

## 2017-05-31 ENCOUNTER — Telehealth: Payer: Self-pay

## 2017-05-31 LAB — WOUND CULTURE
MICRO NUMBER:: 90133618
SPECIMEN QUALITY:: ADEQUATE

## 2017-05-31 MED ORDER — SULFAMETHOXAZOLE-TRIMETHOPRIM 800-160 MG PO TABS: 1.0000 | ORAL_TABLET | Freq: Two times a day (BID) | ORAL | 0 refills | Status: DC

## 2017-05-31 NOTE — Progress Notes (Signed)
Patient informed of x-ray results. 

## 2017-05-31 NOTE — Telephone Encounter (Signed)
Patient is concerned she never received a call from our office regarding her x-ray results.  She states the x-ray tech led her to believe it may be abnormal.   She continues to have pus draining from her finger with red streaks running up her arm.  She says she left two messages on Friday and there was no return call.  Please advise.

## 2017-05-31 NOTE — Telephone Encounter (Signed)
A result note was sent on Friday to inform the patient that her x-ray was without signs of osteomyelitis. Her cultures came back a this morning and showed that there is MRSA infection present and I have sent in a prescription for Bactrim for her to start taking. She can stop taking Augmentin at this time. If she develops fevers or continues to have worsening symptoms she will need a follow up and possibly IV antibiotics.

## 2017-05-31 NOTE — Progress Notes (Signed)
Patient advised to stop Augmentin and start Bactrim.  X-ray results given .   Laurell Josephsammy K King, RN

## 2017-06-07 ENCOUNTER — Telehealth (INDEPENDENT_AMBULATORY_CARE_PROVIDER_SITE_OTHER): Payer: Self-pay | Admitting: Orthopaedic Surgery

## 2017-06-07 NOTE — Telephone Encounter (Signed)
yes

## 2017-06-07 NOTE — Telephone Encounter (Signed)
Spoke with patient and she will ask her [facility] dr to refer her to Hydrographic surveyorHand surgeon..Marland Kitchen

## 2017-06-07 NOTE — Telephone Encounter (Signed)
Patient called stating that she was told to give the office a call when she needed to talk to Dr. Cleophas DunkerWhitfield.

## 2017-06-07 NOTE — Telephone Encounter (Signed)
Do you want to me call?

## 2017-06-08 ENCOUNTER — Emergency Department (HOSPITAL_COMMUNITY): Payer: BLUE CROSS/BLUE SHIELD

## 2017-06-08 ENCOUNTER — Inpatient Hospital Stay (HOSPITAL_COMMUNITY)
Admission: EM | Admit: 2017-06-08 | Discharge: 2017-06-12 | DRG: 988 | Disposition: A | Discharge status: Home Health | Payer: BLUE CROSS/BLUE SHIELD | Attending: Family Medicine | Admitting: Family Medicine

## 2017-06-08 ENCOUNTER — Inpatient Hospital Stay (HOSPITAL_COMMUNITY): Payer: BLUE CROSS/BLUE SHIELD

## 2017-06-08 DIAGNOSIS — Z6841 Body Mass Index (BMI) 40.0 and over, adult: Secondary | ICD-10-CM

## 2017-06-08 DIAGNOSIS — M86141 Other acute osteomyelitis, right hand: Secondary | ICD-10-CM | POA: Diagnosis not present

## 2017-06-08 DIAGNOSIS — Z7984 Long term (current) use of oral hypoglycemic drugs: Secondary | ICD-10-CM | POA: Diagnosis not present

## 2017-06-08 DIAGNOSIS — R011 Cardiac murmur, unspecified: Secondary | ICD-10-CM | POA: Diagnosis present

## 2017-06-08 DIAGNOSIS — G4733 Obstructive sleep apnea (adult) (pediatric): Secondary | ICD-10-CM | POA: Diagnosis not present

## 2017-06-08 DIAGNOSIS — Z981 Arthrodesis status: Secondary | ICD-10-CM

## 2017-06-08 DIAGNOSIS — R51 Headache: Secondary | ICD-10-CM | POA: Diagnosis present

## 2017-06-08 DIAGNOSIS — A4902 Methicillin resistant Staphylococcus aureus infection, unspecified site: Secondary | ICD-10-CM

## 2017-06-08 DIAGNOSIS — Z79899 Other long term (current) drug therapy: Secondary | ICD-10-CM | POA: Diagnosis not present

## 2017-06-08 DIAGNOSIS — E1140 Type 2 diabetes mellitus with diabetic neuropathy, unspecified: Secondary | ICD-10-CM | POA: Diagnosis not present

## 2017-06-08 DIAGNOSIS — Z96651 Presence of right artificial knee joint: Secondary | ICD-10-CM | POA: Diagnosis present

## 2017-06-08 DIAGNOSIS — N3281 Overactive bladder: Secondary | ICD-10-CM | POA: Diagnosis present

## 2017-06-08 DIAGNOSIS — M14672 Charcot's joint, left ankle and foot: Secondary | ICD-10-CM | POA: Diagnosis not present

## 2017-06-08 DIAGNOSIS — M109 Gout, unspecified: Secondary | ICD-10-CM | POA: Diagnosis present

## 2017-06-08 DIAGNOSIS — E872 Acidosis: Secondary | ICD-10-CM | POA: Diagnosis present

## 2017-06-08 DIAGNOSIS — W228XXA Striking against or struck by other objects, initial encounter: Secondary | ICD-10-CM | POA: Diagnosis present

## 2017-06-08 DIAGNOSIS — E1169 Type 2 diabetes mellitus with other specified complication: Principal | ICD-10-CM | POA: Diagnosis present

## 2017-06-08 DIAGNOSIS — G609 Hereditary and idiopathic neuropathy, unspecified: Secondary | ICD-10-CM | POA: Diagnosis not present

## 2017-06-08 DIAGNOSIS — B9562 Methicillin resistant Staphylococcus aureus infection as the cause of diseases classified elsewhere: Secondary | ICD-10-CM | POA: Diagnosis present

## 2017-06-08 DIAGNOSIS — Z833 Family history of diabetes mellitus: Secondary | ICD-10-CM | POA: Diagnosis not present

## 2017-06-08 DIAGNOSIS — Z8614 Personal history of Methicillin resistant Staphylococcus aureus infection: Secondary | ICD-10-CM | POA: Diagnosis not present

## 2017-06-08 DIAGNOSIS — S61200A Unspecified open wound of right index finger without damage to nail, initial encounter: Secondary | ICD-10-CM | POA: Diagnosis present

## 2017-06-08 DIAGNOSIS — I5030 Unspecified diastolic (congestive) heart failure: Secondary | ICD-10-CM | POA: Diagnosis not present

## 2017-06-08 DIAGNOSIS — E11610 Type 2 diabetes mellitus with diabetic neuropathic arthropathy: Secondary | ICD-10-CM | POA: Diagnosis present

## 2017-06-08 DIAGNOSIS — M869 Osteomyelitis, unspecified: Secondary | ICD-10-CM | POA: Diagnosis not present

## 2017-06-08 LAB — CBC WITH DIFFERENTIAL/PLATELET
BASOS PCT: 0 %
Basophils Absolute: 0 10*3/uL (ref 0.0–0.1)
EOS ABS: 0.2 10*3/uL (ref 0.0–0.7)
EOS PCT: 2 %
HCT: 34.9 % — ABNORMAL LOW (ref 36.0–46.0)
HEMOGLOBIN: 11 g/dL — AB (ref 12.0–15.0)
LYMPHS ABS: 1.9 10*3/uL (ref 0.7–4.0)
Lymphocytes Relative: 26 %
MCH: 28.6 pg (ref 26.0–34.0)
MCHC: 31.5 g/dL (ref 30.0–36.0)
MCV: 90.6 fL (ref 78.0–100.0)
MONOS PCT: 5 %
Monocytes Absolute: 0.4 10*3/uL (ref 0.1–1.0)
NEUTROS PCT: 67 %
Neutro Abs: 4.8 10*3/uL (ref 1.7–7.7)
PLATELETS: 256 10*3/uL (ref 150–400)
RBC: 3.85 MIL/uL — ABNORMAL LOW (ref 3.87–5.11)
RDW: 15.1 % (ref 11.5–15.5)
WBC: 7.2 10*3/uL (ref 4.0–10.5)

## 2017-06-08 LAB — COMPREHENSIVE METABOLIC PANEL
ALK PHOS: 78 U/L (ref 38–126)
ALT: 14 U/L (ref 14–54)
AST: 18 U/L (ref 15–41)
Albumin: 3.3 g/dL — ABNORMAL LOW (ref 3.5–5.0)
Anion gap: 14 (ref 5–15)
BUN: 10 mg/dL (ref 6–20)
CALCIUM: 9.4 mg/dL (ref 8.9–10.3)
CO2: 20 mmol/L — AB (ref 22–32)
Chloride: 105 mmol/L (ref 101–111)
Creatinine, Ser: 0.84 mg/dL (ref 0.44–1.00)
Glucose, Bld: 213 mg/dL — ABNORMAL HIGH (ref 65–99)
Potassium: 5 mmol/L (ref 3.5–5.1)
Sodium: 139 mmol/L (ref 135–145)
Total Bilirubin: 0.7 mg/dL (ref 0.3–1.2)
Total Protein: 6.7 g/dL (ref 6.5–8.1)

## 2017-06-08 LAB — LACTIC ACID, PLASMA: LACTIC ACID, VENOUS: 2 mmol/L — AB (ref 0.5–1.9)

## 2017-06-08 LAB — URINALYSIS, ROUTINE W REFLEX MICROSCOPIC
Bilirubin Urine: NEGATIVE
GLUCOSE, UA: NEGATIVE mg/dL
Hgb urine dipstick: NEGATIVE
Ketones, ur: NEGATIVE mg/dL
LEUKOCYTES UA: NEGATIVE
Nitrite: NEGATIVE
PH: 5 (ref 5.0–8.0)
Protein, ur: NEGATIVE mg/dL
Specific Gravity, Urine: 1.019 (ref 1.005–1.030)

## 2017-06-08 LAB — GLUCOSE, CAPILLARY
GLUCOSE-CAPILLARY: 155 mg/dL — AB (ref 65–99)
Glucose-Capillary: 120 mg/dL — ABNORMAL HIGH (ref 65–99)

## 2017-06-08 LAB — I-STAT CG4 LACTIC ACID, ED
LACTIC ACID, VENOUS: 2.13 mmol/L — AB (ref 0.5–1.9)
Lactic Acid, Venous: 3.71 mmol/L (ref 0.5–1.9)

## 2017-06-08 LAB — I-STAT BETA HCG BLOOD, ED (MC, WL, AP ONLY): I-stat hCG, quantitative: <5 m[IU]/mL (ref <5)

## 2017-06-08 MED ORDER — HYDROCODONE-ACETAMINOPHEN 10-325 MG PO TABS
1.0000 | ORAL_TABLET | Freq: Four times a day (QID) | ORAL | Status: DC | PRN
  Administered 2017-06-08 – 2017-06-12 (×4): 1 via ORAL
  Filled 2017-06-08 (×4): qty 1

## 2017-06-08 MED ORDER — MIRABEGRON ER 25 MG PO TB24: 50.0000 mg | ORAL_TABLET | Freq: Every morning | ORAL | Status: DC

## 2017-06-08 MED ORDER — GADOBENATE DIMEGLUMINE 529 MG/ML IV SOLN
20.0000 mL | Freq: Once | INTRAVENOUS | Status: AC
  Administered 2017-06-08: 20 mL via INTRAVENOUS

## 2017-06-08 MED ORDER — ACETAMINOPHEN 325 MG PO TABS: 650.0000 mg | ORAL_TABLET | Freq: Four times a day (QID) | ORAL | Status: DC | PRN

## 2017-06-08 MED ORDER — INSULIN ASPART 100 UNIT/ML ~~LOC~~ SOLN
0.0000 [IU] | Freq: Three times a day (TID) | SUBCUTANEOUS | Status: DC
  Administered 2017-06-09 – 2017-06-12 (×7): 1 [IU] via SUBCUTANEOUS

## 2017-06-08 MED ORDER — ONDANSETRON HCL 4 MG/2ML IJ SOLN: 4.0000 mg | Freq: Four times a day (QID) | INTRAMUSCULAR | Status: DC | PRN

## 2017-06-08 MED ORDER — SODIUM CHLORIDE 0.9 % IV BOLUS (SEPSIS)
500.0000 mL | Freq: Once | INTRAVENOUS | Status: AC
  Administered 2017-06-08: 500 mL via INTRAVENOUS

## 2017-06-08 MED ORDER — INSULIN ASPART 100 UNIT/ML ~~LOC~~ SOLN
0.0000 [IU] | Freq: Every day | SUBCUTANEOUS | Status: DC
  Administered 2017-06-11: 2 [IU] via SUBCUTANEOUS

## 2017-06-08 MED ORDER — POLYETHYLENE GLYCOL 3350 17 G PO PACK: 17.0000 g | PACK | Freq: Every day | ORAL | Status: DC | PRN

## 2017-06-08 MED ORDER — ACETAMINOPHEN 650 MG RE SUPP: 650.0000 mg | Freq: Four times a day (QID) | RECTAL | Status: DC | PRN

## 2017-06-08 MED ORDER — SODIUM CHLORIDE 0.9 % IV SOLN
1250.0000 mg | Freq: Two times a day (BID) | INTRAVENOUS | Status: DC
  Administered 2017-06-09 – 2017-06-10 (×4): 1250 mg via INTRAVENOUS
  Filled 2017-06-08 (×4): qty 1250

## 2017-06-08 MED ORDER — DULOXETINE HCL 60 MG PO CPEP
60.0000 mg | ORAL_CAPSULE | Freq: Every day | ORAL | Status: DC
  Administered 2017-06-09 – 2017-06-12 (×3): 60 mg via ORAL
  Filled 2017-06-08 (×3): qty 1

## 2017-06-08 MED ORDER — VANCOMYCIN HCL 10 G IV SOLR
2500.0000 mg | Freq: Once | INTRAVENOUS | Status: AC
  Administered 2017-06-08: 2500 mg via INTRAVENOUS
  Filled 2017-06-08: qty 2500

## 2017-06-08 MED ORDER — EXENATIDE ER 2 MG ~~LOC~~ PEN: 2.0000 mg | PEN_INJECTOR | SUBCUTANEOUS | Status: DC

## 2017-06-08 MED ORDER — ONDANSETRON HCL 4 MG PO TABS: 4.0000 mg | ORAL_TABLET | Freq: Four times a day (QID) | ORAL | Status: DC | PRN

## 2017-06-08 NOTE — Consult Note (Signed)
History: CC / Reason for Visit: Right long finger infection HPI: This patient is a 52 year old, right-hand-dominant, female with type 2 diabetes and significant self-reported neuropathy into her digits who is seen today for further evaluation and potential treatment for her right long finger problem.  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  She states that on 05/24/2017, she had no enlargement of the long finger but knocked the finger on her nightstand where it erupted with a substance that appeared to be pus at the tip.  The patient was then seen by infectious disease at Dr. Moshe CiproHatcher's office on 05/27/2017.  X-rays were taken and the wound swabbed.  She was placed on antibiotics, and Septra was added once the cultures returned as MRSA.  Per her report, she was asked to return to infectious disease if the digit did not improve by the end of the course of antibiotics.  Over the last several days, the patient has noticed increased swelling, warmth, erythema, and some pain into the dorsal aspect of the hand over the MP.  Since she had an established relationship with Dr. Cleophas DunkerWhitfield, she called his office about her problem, and it was recommended that she be seen by a hand surgeon.  [facility] then [facility] yesterday, where she was scheduled with Dr. Madelon Lipsaffrey.  Dr. Madelon Lipsaffrey saw her yesterday, then referred her here for further evaluation and treatment.  The patient indicates that she has 2 days left of antibiotics and has not yet called Dr. Moshe CiproHatcher's office for any type of reevaluation.  The patient remains on a low dose of Keflex for a history of Charcot foot and also has had 6 surgeries on her left ankle and foot due to Charcot's.  Past medical history, past surgical history, family history, social history, medications, allergies and review of systems are thoroughly reviewed by me,  signed and scanned into SRS today.  The patient is otherwise healthy.  Exam:  Vitals: Refer to EMR. Constitutional:  WD, WN, NAD HEENT:  NCAT, EOMI Neuro/Psych:  Alert & oriented to person, place, and time; appropriate mood & affect Lymphatic: No generalized UE edema or lymphadenopathy Extremities / MSK:  Both UE are normal with respect to appearance, ranges of motion, joint stability, muscle strength/tone, sensation, & perfusion except as otherwise noted:  The right long finger is substantially enlarged.  It is erythematous from just distal to the PIP to the fingertip.  There is an area that is open at the fingertip with a small amount of substance oozing and macerated white looking skin where she has kept it covered with a Band-Aid.  There is peeling skin as well as another area that appears to have been fully open at one time.  No drainage from that area is noted.  The patient has no sensibility to light touch in the digit.  It is held stiffly into extension.  Flexion at the PIP is approximately 30.  Palpation of the flexor tendon in the palm is not painful, however direct palpation over the dorsal aspect of the proximal finger/base of hand.  No streaking is noted but there is also some dorsal redness about the MPs of the index and long finger.  Labs / Xrays:  No radiographic studies obtained today.  X-rays including 3 views of the right long finger taken on 05/27/2017 were evaluated and demonstrated what appears to be a distal  phalanx fracture distally and some longitudinal lucency in the proximaly portion of the distal phalanx, best seen on the AP.  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  I spoke with Dr. Daiva Eves, who is on-call today for infectious diseases, regarding coordinated care for this patient.  It was recommended that she proceed to the hospital, where she be admitted for parenteral  antibiotic management.  Depending upon how this improves over the next 24-72 hours, surgery may be appropriate either as a debridement or, more likely, an amputation.  Hopefully, if the spreading cellulitic process reverses course on antibiotics such that the problem is contained at the tip, a more conservative level of amputation can be performed and heal uneventfully.  Because surgery at some point is likely, depending upon her clinical response to antibiotics, I recommend SCDs and ambulation for VTE  Prophylaxis.  She may have a diet for now, as I can make her NPO at the appropriate time for any planned procedure, which might occur this hospitalization versus later as an outpatient.  Neil Crouch, MD [facility] [phone]
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
## 2017-06-08 NOTE — Progress Notes (Signed)
Pharmacy Antibiotic Note  [name] is a 52 y.o. female admitted on 06/08/2017 with osteomyelitis of his finger.  Pharmacy has been consulted for vancomycin dosing.WBC wnl. SCr 0.84. LA 3.71. CrCl ~ 90 mL/min   Plan: -Vancomycin 2500 mg IV once, then vancomycin 1250 mg IV q 12 hours -Monitor CBC, renal fx, clinical progress and VT at SS   Height: 5\' 4"  (162.6 cm) Weight: 279 lb (126.6 kg) IBW/kg (Calculated) : 54.7  Temp (24hrs), Avg:98.3 F (36.8 C), Min:98.3 F (36.8 C), Max:98.3 F (36.8 C)  Recent Labs  Lab 06/08/17 1057 06/08/17 1115  WBC 7.2  --   CREATININE 0.84  --   LATICACIDVEN  --  3.71*    Estimated Creatinine Clearance: 104.4 mL/min (by C-G formula based on SCr of 0.84 mg/dL).    No Known Allergies  Antimicrobials this admission: Vanc 2/12 >>   Dose adjustments this admission: None  Microbiology results: 1/31 Wound Cx: MRSA   Thank you for allowing pharmacy to be a part of this patient's care.  Vinnie LevelBenjamin [name], PharmD., BCPS Clinical Pharmacist Pager [phone]

## 2017-06-08 NOTE — ED Provider Notes (Signed)
[facility] EMERGENCY DEPARTMENT Provider Note   CSN: [id number] Arrival date & time: 06/08/17  1041     History   Chief Complaint Chief Complaint  Patient presents with  . Finger Injury    HPI [name] is a 52 y.o. female.  HPI Patient sent in from Dr. Janee Morn from hand surgeon's office.  Has infection of right middle finger.  History of cellulitis on the finger has been on chronic Keflex and followed by Bactrim for MRSA.  Has been on Bactrim for about a week and around a week ago had an x-ray that did not show osteomyelitis however x-ray today showed osteomyelitis of the distal finger.  Has had occasional chills.  Says she has a history of neuropathy but her finger is now starting to hurt also.  She sees Dr. Ninetta Lights from infectious disease also. Past Medical History:  Diagnosis Date  . Arthritis   . Diabetes mellitus without complication (HCC)   . Gastroenteritis 04/2016  . Headache(784.0)    hormonal headache  . Neuropathy    bilateral feet  . Obese   . Pneumonia    hx 20+ years ago  . Sleep apnea    bipap 1 yr  . Ulcer of foot (HCC)    toe ulcer greater than a year ago    Patient Active Problem List   Diagnosis Date Noted  . Open wnd of finger 05/27/2017  . Lactic acidosis 05/05/2016  . Dehydration 05/05/2016  . Gastroenteritis 05/04/2016  . DM (diabetes mellitus), type 2 with complications (HCC) 05/04/2016  . Charcot ankle 08/03/2014  . 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 Postoperative anemia due to acute blood loss 06/25/2011  . Osteoarthritis of knee 06/23/2011  . Obesity, morbid (more than 100 lbs over ideal weight or BMI > 40) (HCC) 06/23/2011    Past Surgical History:  Procedure Laterality Date  . ANKLE FUSION Left 08/03/2014   Procedure: Left Tibiocalcaneal Fusion;  Surgeon: Nadara Mustard, MD;  Location: [facility] OR;  Service: Orthopedics;  Laterality: Left;  . APPLICATION OF WOUND VAC Left 08/03/2014   Procedure:  APPLICATION OF WOUND VAC;  Surgeon: Nadara Mustard, MD;  Location: [facility] OR;  Service: Orthopedics;  Laterality: Left;  . I&D KNEE WITH POLY EXCHANGE Right 01/31/2013   Procedure: IRRIGATION AND DEBRIDEMENT KNEE WITH POLY EXCHANGE possible Antibiotic Spacer;  Surgeon: Valeria Batman, MD;  Location: [facility] OR;  Service: Orthopedics;  Laterality: Right;  . INCISION AND DRAINAGE Right 01/31/2013   ANTIBIOTIC SPACER  RIGHT KNEE  DR Cleophas Dunker   . KNEE ARTHROSCOPY     left knee  . ORIF ANKLE FRACTURE Left 03/16/2014   Procedure: Foot Excision Charcot Collapse,  Internal Fixation;  Surgeon: Nadara Mustard, MD;  Location: [facility] OR;  Service: Orthopedics;  Laterality: Left;  . REPLACEMENT TOTAL KNEE     left  . TOTAL KNEE ARTHROPLASTY  06/23/2011   Procedure: TOTAL KNEE ARTHROPLASTY;  Surgeon: Valeria Batman, MD;  Location: [facility] OR;  Service: Orthopedics;  Laterality: Right;  RIGHT TOTAL KNEE REPLACEMENT     OB History    No data available       Home Medications    Prior to Admission medications   Medication Sig Start Date End Date Taking? Authorizing Provider  allopurinol (ZYLOPRIM) 100 MG tablet Take by mouth.    [provider]  cephALEXin (KEFLEX) 500 MG capsule Take 1 capsule (500 mg total) by mouth 2 (two) times daily. 12/09/16  Ginnie Smart, MD  diclofenac (VOLTAREN) 75 MG EC tablet Take 75 mg by mouth 2 (two) times daily.  02/28/14   [provider]  DULoxetine (CYMBALTA) 60 MG capsule Take 60 mg by mouth daily.    [provider]  Exenatide ER 2 MG PEN Inject 2 mg into the skin every 7 (seven) days. 02/06/16   [provider]  glipiZIDE (GLUCOTROL XL) 10 MG 24 hr tablet  05/23/17   [provider]  glucosamine-chondroitin 500-400 MG tablet Take 2 tablets by mouth daily.     [provider]  HYDROcodone-acetaminophen (NORCO) 10-325 MG tablet Take by mouth. 07/02/16   [provider]  metroNIDAZOLE (FLAGYL) 500 MG tablet Take 1  tablet (500 mg total) by mouth every 8 (eight) hours. Patient not taking: Reported on 05/27/2017 05/05/16   Joseph Art, DO  mirabegron ER (MYRBETRIQ) 50 MG TB24 tablet Take 50 mg by mouth every morning. 01/13/16   [provider]  Multiple Vitamin (MULTIVITAMIN WITH MINERALS) TABS Take 1 tablet by mouth daily.    [provider]  ondansetron (ZOFRAN-ODT) 8 MG disintegrating tablet Take 8 mg by mouth every 8 (eight) hours as needed for nausea/vomiting. DISSOLVE IN THE MOUTH 04/30/16   [provider]  SitaGLIPtin-MetFORMIN HCl (JANUMET XR) 50-1000 MG TB24 Take 1 tablet by mouth at bedtime. 11/21/15   [provider]  sulfamethoxazole-trimethoprim (BACTRIM DS,SEPTRA DS) 800-160 MG tablet Take 1 tablet by mouth 2 (two) times daily. 05/31/17   Veryl Speak, FNP  VITAMIN A PO Take 4,800 mg by mouth daily.    [provider]    Family History Family History  Problem Relation Age of Onset  . Anesthesia problems Father   . Stroke Father   . Hypotension Neg Hx   . Malignant hyperthermia Neg Hx   . Pseudochol deficiency Neg Hx     Social History Social History   Tobacco Use  . Smoking status: Never Smoker  . Smokeless tobacco: Never Used  Substance Use Topics  . Alcohol use: Yes    Alcohol/week: 0.0 oz    Comment: occ  . Drug use: No     Allergies   Patient has no known allergies.   Review of Systems Review of Systems  Constitutional: Positive for appetite change and chills. Negative for fever.  Respiratory: Negative for shortness of breath.   Cardiovascular: Negative for chest pain.  Gastrointestinal: Negative for abdominal distention.  Musculoskeletal: Positive for back pain.  Skin: Positive for wound.  Neurological: Positive for numbness. Negative for weakness.     Physical Exam Updated Vital Signs BP (!) 167/61 (BP Location: Right Arm)   Pulse (!) 104   Temp 98.3 F (36.8 C) (Oral)   Resp 18   Ht 5\' 4"  (1.626 m)   Wt  126.6 kg (279 lb)   SpO2 99%   BMI 47.89 kg/m   Physical Exam  Constitutional:  Patient is obese  HENT:  Head: Normocephalic.  Eyes: Pupils are equal, round, and reactive to light.  Neck: Neck supple.  Cardiovascular:  Mild tachycardia  Pulmonary/Chest: Effort normal.  Abdominal: There is no tenderness.  Musculoskeletal:  Erythema edema and infection of right middle finger  Skin: Skin is warm. Capillary refill takes less than 2 seconds.         ED Treatments / Results  Labs (all labs ordered are listed, but only abnormal results are displayed) Labs Reviewed  COMPREHENSIVE METABOLIC PANEL - Abnormal; Notable for  the following components:      Result Value   CO2 20 (*)    Glucose, Bld 213 (*)    Albumin 3.3 (*)    All other components within normal limits  CBC WITH DIFFERENTIAL/PLATELET - Abnormal; Notable for the following components:   RBC 3.85 (*)    Hemoglobin 11.0 (*)    HCT 34.9 (*)    All other components within normal limits  I-STAT CG4 LACTIC ACID, ED - Abnormal; Notable for the following components:   Lactic Acid, Venous 3.71 (*)    All other components within normal limits  CULTURE, BLOOD (ROUTINE X 2)  CULTURE, BLOOD (ROUTINE X 2)  URINALYSIS, ROUTINE W REFLEX MICROSCOPIC  I-STAT BETA HCG BLOOD, ED ([facility], [facility], [facility] ONLY)    EKG  EKG Interpretation None       Radiology Dg Finger Middle Right  Result Date: 06/08/2017 CLINICAL DATA:  Diabetic patient with an infection the right long finger. Initial encounter. EXAM: RIGHT MIDDLE FINGER 2+V COMPARISON:  Plain films right long finger 05/27/2017. FINDINGS: Soft tissues of the long finger are markedly swollen. There is bony destructive change throughout the distal phalanx consistent with osteomyelitis. Chip fracture off the base of the distal phalanx on the radial side is noted. Small osseous fragment along the PIP joint is chronic and likely due to remote injury. IMPRESSION: Osteomyelitis throughout the  distal phalanx of the long finger. Soft tissue swelling consistent with cellulitis also identified. Electronically Signed   By: Drusilla Kannerhomas  Dalessio M.D.   On: 06/08/2017 11:26    Procedures Procedures (including critical care time)  Medications Ordered in ED Medications  vancomycin (VANCOCIN) 2,500 mg in sodium chloride 0.9 % 500 mL IVPB (not administered)     Initial Impression / Assessment and Plan / ED Course  I have reviewed the triage vital signs and the nursing notes.  Pertinent labs & imaging results that were available during my care of the patient were reviewed by me and considered in my medical decision making (see chart for details).     Patient with osteomyelitis of finger.  Good blood pressure but does have a somewhat elevated lactic acid.  Has been on Bactrim.  Will start vancomycin.  Will admit to internal medicine.  Has seen Dr. Janee Mornhompson from hand surgery.  Final Clinical Impressions(s) / ED Diagnoses   Final diagnoses:  Osteomyelitis of right hand, unspecified type [facility](HCC)    ED Discharge Orders    None       Benjiman CorePickering, Annetta Deiss, MD 06/08/17 1221

## 2017-06-08 NOTE — ED Triage Notes (Signed)
Pt states that she has had an infection on her rt middle finger. Pt states that she has a fracture as well. Pt saw infectious disease. Pt was reported to have MRSA. Pt states that she was told after multiple antibiotics that she needs to have a PICC line and IV antibiotics.

## 2017-06-08 NOTE — Progress Notes (Signed)
[facility] will follow pt with ID team to support home IV ABX as ordered at DC.  If patient discharges after hours, please call [phone].   Sedalia Mutaamela S Chandler 06/08/2017, 6:05 PM

## 2017-06-09 ENCOUNTER — Other Ambulatory Visit: Payer: Self-pay | Admitting: Orthopedic Surgery

## 2017-06-09 ENCOUNTER — Inpatient Hospital Stay (HOSPITAL_COMMUNITY): Payer: BLUE CROSS/BLUE SHIELD

## 2017-06-09 DIAGNOSIS — G4733 Obstructive sleep apnea (adult) (pediatric): Secondary | ICD-10-CM

## 2017-06-09 DIAGNOSIS — A4902 Methicillin resistant Staphylococcus aureus infection, unspecified site: Secondary | ICD-10-CM

## 2017-06-09 DIAGNOSIS — I5030 Unspecified diastolic (congestive) heart failure: Secondary | ICD-10-CM

## 2017-06-09 LAB — ECHOCARDIOGRAM COMPLETE
Height: 64 in
Weight: 4462.11 oz

## 2017-06-09 LAB — BASIC METABOLIC PANEL
ANION GAP: 12 (ref 5–15)
BUN: 9 mg/dL (ref 6–20)
CHLORIDE: 104 mmol/L (ref 101–111)
CO2: 24 mmol/L (ref 22–32)
Calcium: 9 mg/dL (ref 8.9–10.3)
Creatinine, Ser: 0.71 mg/dL (ref 0.44–1.00)
GFR calc Af Amer: >60 mL/min (ref >60)
GLUCOSE: 121 mg/dL — AB (ref 65–99)
POTASSIUM: 4.5 mmol/L (ref 3.5–5.1)
Sodium: 140 mmol/L (ref 135–145)

## 2017-06-09 LAB — GLUCOSE, CAPILLARY
GLUCOSE-CAPILLARY: 124 mg/dL — AB (ref 65–99)
GLUCOSE-CAPILLARY: 120 mg/dL — AB (ref 65–99)
Glucose-Capillary: 114 mg/dL — ABNORMAL HIGH (ref 65–99)
Glucose-Capillary: 123 mg/dL — ABNORMAL HIGH (ref 65–99)

## 2017-06-09 LAB — CBC
HEMATOCRIT: 32.7 % — AB (ref 36.0–46.0)
HEMOGLOBIN: 10.3 g/dL — AB (ref 12.0–15.0)
MCH: 28.5 pg (ref 26.0–34.0)
MCHC: 31.5 g/dL (ref 30.0–36.0)
MCV: 90.6 fL (ref 78.0–100.0)
Platelets: 260 10*3/uL (ref 150–400)
RBC: 3.61 MIL/uL — AB (ref 3.87–5.11)
RDW: 15.1 % (ref 11.5–15.5)
WBC: 6.1 10*3/uL (ref 4.0–10.5)

## 2017-06-09 LAB — HIV ANTIBODY (ROUTINE TESTING W REFLEX): HIV Screen 4th Generation wRfx: NONREACTIVE

## 2017-06-09 NOTE — Progress Notes (Signed)
Right LF marginally improved overnight with regard to redness, prox swelling,  Pain, etc.  Continue IV antibiotics, will re-assess tomorrow evening for  Possible surgery on Friday afternoon/evening, provided clinical situation is appropriate.

## 2017-06-09 NOTE — Progress Notes (Signed)
Subjective:  She had trouble getting through to [place] on our phone line as outpatient and she mistakenly believed that osteomyelitis HAD been seen on original Xray on January 31st --it was not   Antibiotics:  Anti-infectives (From admission, onward)   Start     Dose/Rate Route Frequency Ordered Stop   06/09/17 0100  vancomycin (VANCOCIN) 1,250 mg in sodium chloride 0.9 % 250 mL IVPB     1,250 mg 166.7 mL/hr over 90 Minutes Intravenous Every 12 hours 06/08/17 1231     06/08/17 1230  vancomycin (VANCOCIN) 2,500 mg in sodium chloride 0.9 % 500 mL IVPB     2,500 mg 250 mL/hr over 120 Minutes Intravenous  Once 06/08/17 1202 06/08/17 1453      Medications: Scheduled Meds: . DULoxetine  60 mg Oral Daily  . insulin aspart  0-5 Units Subcutaneous QHS  . insulin aspart  0-9 Units Subcutaneous TID WC   Continuous Infusions: . vancomycin Stopped (06/09/17 1446)   PRN Meds:.HYDROcodone-acetaminophen, ondansetron **OR** ondansetron (ZOFRAN) IV, polyethylene glycol    Objective: Weight change:   Intake/Output Summary (Last 24 hours) at 06/09/2017 1734 Last data filed at 06/09/2017 1353 Gross per 24 hour  Intake 850 ml  Output 3025 ml  Net -2175 ml   Blood pressure 127/65, pulse 86, temperature 98.5 F (36.9 C), temperature source Oral, resp. rate 18, height 5\' 4"  (1.626 m), weight 278 lb 14.1 oz (126.5 kg), SpO2 98 %. Temp:  [97.9 F (36.6 C)-98.8 F (37.1 C)] 98.5 F (36.9 C) (02/13 1351) Pulse Rate:  [84-86] 86 (02/13 1351) Resp:  [18-19] 18 (02/13 1351) BP: (119-127)/(62-70) 127/65 (02/13 1351) SpO2:  [98 %] 98 % (02/13 1351)  Physical Exam: General: Alert and awake, oriented x3, not in any acute distress. HEENT: anicteric sclera,  EOMI CVS regular rate, normal  Chest:no wheezing,  Abdomen: softnondistended Neuro: nonfocal  06/08/17:       Skin 06/09/17:    06/09/17: index finger            CBC:  CBC Latest Ref Rng & Units 06/09/2017  06/08/2017 05/27/2017  WBC 4.0 - 10.5 K/uL 6.1 7.2 5.8  Hemoglobin 12.0 - 15.0 g/dL 10.3(L) 11.0(L) 11.5(L)  Hematocrit 36.0 - 46.0 % 32.7(L) 34.9(L) 35.1  Platelets 150 - 400 K/uL 260 256 317      BMET Recent Labs    06/08/17 1057 06/09/17 0541  NA 139 140  K 5.0 4.5  CL 105 104  CO2 20* 24  GLUCOSE 213* 121*  BUN 10 9  CREATININE 0.84 0.71  CALCIUM 9.4 9.0     Liver Panel  Recent Labs    06/08/17 1057  PROT 6.7  ALBUMIN 3.3*  AST 18  ALT 14  ALKPHOS 78  BILITOT 0.7       Sedimentation Rate No results for input(s): ESRSEDRATE in the last 72 hours. C-Reactive Protein No results for input(s): CRP in the last 72 hours.  Micro Results: Recent Results (from the past 720 hour(s))  Wound culture     Status: Abnormal   Collection Time: 05/27/17  1:39 PM  Result Value Ref Range Status   MICRO NUMBER: [id number]  Final   SPECIMEN QUALITY: ADEQUATE  Final   SOURCE: FINGER  Final   STATUS: FINAL  Final   GRAM STAIN:   Final    No white blood cells seen No epithelial cells seen Rare Gram positive cocci in pairs   ISOLATE 1: methicillin  resistant Staphylococcus aureus (A)  Final      Susceptibility   Methicillin resistant staphylococcus aureus - AEROBIC CULT, GRAM STAIN POSITIVE 1    VANCOMYCIN 1 Sensitive     CIPROFLOXACIN >=8 Resistant     CLINDAMYCIN <=0.25 Sensitive     LEVOFLOXACIN 4 Resistant     ERYTHROMYCIN >=8 Resistant     GENTAMICIN <=0.5 Sensitive     OXACILLIN* NR Resistant      * Oxacillin-resistant staphylococci are resistant toall currently available beta-lactam antimicrobialagents including penicillins, beta lactam/beta-lactamase inhibitor combinations, and cephems withstaphylococcal indications, including Cefazolin.    TETRACYCLINE <=1 Sensitive     TRIMETH/SULFA* <=10 Sensitive      * Oxacillin-resistant staphylococci are resistant toall currently available beta-lactam antimicrobialagents including penicillins, beta lactam/beta-lactamase  inhibitor combinations, and cephems withstaphylococcal indications, including Cefazolin.Legend:S = Susceptible  I = IntermediateR = Resistant  NS = Not susceptible* = Not tested  NR = Not reported**NN = See antimicrobic comments  Blood culture (routine x 2)     Status: None (Preliminary result)   Collection Time: 06/08/17 11:05 AM  Result Value Ref Range Status   Specimen Description BLOOD RIGHT ANTECUBITAL  Final   Special Requests IN PEDIATRIC BOTTLE Blood Culture adequate volume  Final   Culture   Final    NO GROWTH 1 DAY Performed at [facility], [address], [place], [place] [address]    Report Status PENDING  Incomplete  Blood culture (routine x 2)     Status: None (Preliminary result)   Collection Time: 06/08/17 11:58 AM  Result Value Ref Range Status   Specimen Description BLOOD LEFT FOREARM  Final   Special Requests   Final    BOTTLES DRAWN AEROBIC AND ANAEROBIC Blood Culture adequate volume   Culture   Final    NO GROWTH 1 DAY Performed at [facility], [address], [place], [place] [address]    Report Status PENDING  Incomplete    Studies/Results: Mr Hand Right W Wo Contrast  Result Date: 06/08/2017 CLINICAL DATA:  Osteomyelitis of the right middle finger. History of peripheral neuropathy in diabetes. History of gout. EXAM: MRI OF THE RIGHT HAND WITHOUT AND WITH CONTRAST TECHNIQUE: Multiplanar, multisequence MR imaging of the right hand was performed before and after the administration of intravenous contrast. CONTRAST:  20mL MULTIHANCE GADOBENATE DIMEGLUMINE 529 MG/ML IV SOLN COMPARISON:  06/08/2017 radiographs FINDINGS: Bones/Joint/Cartilage Bony destructive findings an active osteomyelitis in the distal phalanx of the middle finger, with poor definition of the cortex. Along the anterior margin of the eroded tuft there is a 0.8 by 0.4 by 0.8 cm hypoenhancing region which could reflect necrotic tissue or abscess, image 21/13. Extensive inflammatory phlegmon  volar to the distal phalanx. Low-level edema in the marrow of the distal phalanx index finger compatible with early osteomyelitis. No appreciable osteomyelitis elsewhere in the hand. Ligaments Thickened collateral ligaments at the distal phalanx of the middle finger, likely due to local inflammation. Muscles and Tendons The flexor digitorum profundus tendon appears to be separated from the distal phalanx of the middle finger by about 6 mm on image 21/8, with mild hyperflexion of the distal phalanx. Mild flexor pollicis longus tenosynovitis, image 22/6. Soft tissues Diffuse prominent subcutaneous edema in the middle phalanx tracking back into the dorsum of the hand, with associated enhancement, compatible with cellulitis. There is a lesser degree of cellulitis in the index finger. IMPRESSION: 1. Osteomyelitis of the distal phalanx middle finger, with volar erosion of  the tuft and a small suspected abscess or focus of necrotic tissue in this region of tuft erosion measuring 0.8 by 0.4 by 0.8 cm. Extensive surrounding cellulitis in the finger extending back into the dorsum of the hand. Local inflammatory phlegmon especially volar to the distal phalanx middle finger. 2. Low-level edema and enhancement in the distal phalanx index finger compatible with early osteomyelitis. There is also some cellulitis in the index finger. 3. Apparent separation of the middle finger flexor digitorum profundus tendon from the base of the distal phalanx by about 6 mm, with mild hyperflexion of the distal phalanx resulting. 4. Mild flexor pollicis longus tenosynovitis. Electronically Signed   By: Gaylyn RongWalter  Liebkemann M.D.   On: 06/08/2017 20:38   Dg Finger Middle Right  Result Date: 06/08/2017 CLINICAL DATA:  Diabetic patient with an infection the right long finger. Initial encounter. EXAM: RIGHT MIDDLE FINGER 2+V COMPARISON:  Plain films right long finger 05/27/2017. FINDINGS: Soft tissues of the long finger are markedly swollen. There  is bony destructive change throughout the distal phalanx consistent with osteomyelitis. Chip fracture off the base of the distal phalanx on the radial side is noted. Small osseous fragment along the PIP joint is chronic and likely due to remote injury. IMPRESSION: Osteomyelitis throughout the distal phalanx of the long finger. Soft tissue swelling consistent with cellulitis also identified. Electronically Signed   By: Drusilla Kannerhomas  Dalessio M.D.   On: 06/08/2017 11:26      Assessment/Plan:  INTERVAL HISTORY: MRI showed the colitis in the distal phalanx of the middle finger that we knew about but also showed some low-level edema and enhancement the distal phalanx of the index finger that was also compatible with osteomyelitis.   Principal Problem:   Osteomyelitis (HCC) Active Problems:   Controlled type 2 diabetes with neuropathy (HCC)   MRSA infection    [name] is a 52 y.o. female with osteomyelitis involving the distal phalanx of the middle finger and possible osteomyelitis in the distal phalanx of the index finger.  She had MRSA isolated on culture from purulent material from the soft tissue taken in January.  We will continue her on IV vancomycin.  She is going to have surgery potentially on Friday on the total finger.  Question whether or not she also needs an intervention on her index finger?  I would strongly recommend getting intraoperative cultures so that we have deep specimens to guide therapy not just the culture that we had from the purulent material from the abscess in January.  I spent greater than 35 minutes with the patient including greater than 50% of time in face to face counsel of the patient eating how her diagnosis of osteomyelitis was made the sensitivity and specificity of x-rays versus MRIs and time course between discovery of her osteomyelitis versus when she was initially evaluated reasoning for MRI during this admission plan of care and in coordination of  her care.    LOS: 1 day   Acey LavCornelius Van Dam 06/09/2017, 5:34 PM

## 2017-06-09 NOTE — Progress Notes (Signed)
Set pt up on CPAP tolerating well no issues to report will continue to monitor.

## 2017-06-09 NOTE — Progress Notes (Signed)
  Echocardiogram 2D Echocardiogram has been performed.  [name] T [name] 06/09/2017, 9:17 AM

## 2017-06-09 NOTE — Progress Notes (Signed)
Placed pt on CPAP tolerating well no issues to report  

## 2017-06-09 NOTE — Progress Notes (Signed)
Family Medicine Teaching Service Daily Progress Note Intern Pager: [phone]  [name] Medical record number: [id number] Date of birth: 1966-01-21 Age: 52 y.o. Gender: female  Primary Care Provider: [name], MD Consultants: Gaylord Shih, ID Code Status: Full  Pt Overview and Major Events to Date:  2/12 - admitted for R finger OM  Assessment and Plan: [name] is a 52 y.o. female presenting with R middle finger osteomyelitis. 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 MRI also consistent with OM of R middle finger and possible early OM of R index also. VSS and afebrile overnight. WBC 7.2>6.1. Initial lactic acidosis improved 3.71>2.0 with IV hydration. Blood cultures drawn, although after initiation of outpatient antibiotics. S/p Keflex (chronic), Augmentin, Bactrim. Wound culture positive for MRSA. ID and Ortho consulted, appreciate recs. Plan is to continue IV antibiotics and reassess tomorrow for need for surgery on Friday. On exam, no drainage noted with marked improvement in surrounding erythema and hand swelling. R index finger wound also noted, no drainage. - continue Vancomycin (2/12 - ), dose per pharmacy - Orthopedics consulted, appreciate recs - ID consulted, appreciate recs - Follow-up blood cultures (obtained after patient has been on abx as an outpatient) - tylenol PRN mild pain - zofran PRN nausea - ambulate patient - contact precautions  Systolic Murmur, new: 3/6 LUSB systolic murmur noted on admission. Not mentioned previously in the chart and patient states she has never been told she has a murmur. Concerning in the setting of wound cultures positive for MRSA. ECHO this admission EF 65-70% without vegetations or valvular abnormalities. - Blood cultures  pending   Type 2 Diabetes  Patient states has been receiving treatment for diabetes since 2017. Last A1c 5.6 05/10/17 per recent PCP note. On Janumet 50-1000mg , glipizide 10mg , bydureon once weekly (been on about 1 year) at home. Denies missed doses. CBG 155 this am 2/13. No sliding scale insulin needed overnight. - sSSI due to insulin naivety  - hold home meds  Charcot foot  Neuropathy Chronic, stable. Has neuropathy in all fingers and from mid-calf down including feet bilaterally. Also with Charcot foot with multiple surgeries in L foot ranging from 2014-2018 on chart review, last surgery 06/2016. Follows with Dr. Remer Macho at [facility], last seen 05/20/17. At that time, deformity appeared stable on imaging with no ulcerations. Following with [employer] for CROW boot and custom orthotics. - monitor  Osteoarthritis Takes Cymbalta and Diclofenac daily for OA pain and neuropathy, Norco as needed. Sees Dr. Talbert Cage with [facility]. Last visit 06/03/17, at that time was undergoing workup with labs and imaging for inflammatory arthritis due to symptoms of stiffness in spine and joints. - holding home diclofenac while admitted and on Vanc - continue home cymbalta - PRN tylenol  Overactive bladder Takes Myrbetriq at home for urinary urgency. Recently out of med and awaiting refill. - continue home myrbetriq  H/o gout Remote. Noted post-operatively. Was on allopurinol for a while but hasn't been on in some time. - monitor   OSA: stable - CPAP qhs  FEN/GI: Carb modified Prophylaxis: SCDs  Disposition: continue inpatient management of OM  Subjective:  Patient states food has been more palatable since starting IV antibiotics, feeling more like herself. States nausea is better  Objective: Temp:  [97.9 F (36.6 C)-99.1 F (37.3 C)] 97.9 F (  36.6 C) (02/13 0419) Pulse Rate:  [84-104] 84 (02/13 0419) Resp:  [18-19] 19 (02/13 0419) BP: (115-167)/(47-70) 124/70 (02/13 0419) SpO2:  [98  %-100 %] 98 % (02/13 0419) Weight:  [278 lb 14.1 oz (126.5 kg)-279 lb (126.6 kg)] 278 lb 14.1 oz (126.5 kg) (02/12 1631) Physical Exam: General: pleasant female, lying in bed, in NAD Cardiovascular: RRR, questionable slight systolic murmur appreciated. No rubs or gallops Respiratory: CTAB, no wheezes/rales/rhonchi Abdomen: soft, obese abdomen, +BS, non tender to palption Extremities: warm and well perfused  Laboratory: Recent Labs  Lab 06/08/17 1057 06/09/17 0541  WBC 7.2 6.1  HGB 11.0* 10.3*  HCT 34.9* 32.7*  PLT 256 260   Recent Labs  Lab 06/08/17 1057 06/09/17 0541  NA 139 140  K 5.0 4.5  CL 105 104  CO2 20* 24  BUN 10 9  CREATININE 0.84 0.71  CALCIUM 9.4 9.0  PROT 6.7  --   BILITOT 0.7  --   ALKPHOS 78  --   ALT 14  --   AST 18  --   GLUCOSE 213* 121*   Imaging/Diagnostic Tests: Mr Hand Right W Wo Contrast  Result Date: 06/08/2017 CLINICAL DATA:  Osteomyelitis of the right middle finger. History of peripheral neuropathy in diabetes. History of gout. EXAM: MRI OF THE RIGHT HAND WITHOUT AND WITH CONTRAST TECHNIQUE: Multiplanar, multisequence MR imaging of the right hand was performed before and after the administration of intravenous contrast. CONTRAST:  20mL MULTIHANCE GADOBENATE DIMEGLUMINE 529 MG/ML IV SOLN COMPARISON:  06/08/2017 radiographs FINDINGS: Bones/Joint/Cartilage Bony destructive findings an active osteomyelitis in the distal phalanx of the middle finger, with poor definition of the cortex. Along the anterior margin of the eroded tuft there is a 0.8 by 0.4 by 0.8 cm hypoenhancing region which could reflect necrotic tissue or abscess, image 21/13. Extensive inflammatory phlegmon volar to the distal phalanx. Low-level edema in the marrow of the distal phalanx index finger compatible with early osteomyelitis. No appreciable osteomyelitis elsewhere in the hand. Ligaments Thickened collateral ligaments at the distal phalanx of the middle finger, likely due to local  inflammation. Muscles and Tendons The flexor digitorum profundus tendon appears to be separated from the distal phalanx of the middle finger by about 6 mm on image 21/8, with mild hyperflexion of the distal phalanx. Mild flexor pollicis longus tenosynovitis, image 22/6. Soft tissues Diffuse prominent subcutaneous edema in the middle phalanx tracking back into the dorsum of the hand, with associated enhancement, compatible with cellulitis. There is a lesser degree of cellulitis in the index finger. IMPRESSION: 1. Osteomyelitis of the distal phalanx middle finger, with volar erosion of the tuft and a small suspected abscess or focus of necrotic tissue in this region of tuft erosion measuring 0.8 by 0.4 by 0.8 cm. Extensive surrounding cellulitis in the finger extending back into the dorsum of the hand. Local inflammatory phlegmon especially volar to the distal phalanx middle finger. 2. Low-level edema and enhancement in the distal phalanx index finger compatible with early osteomyelitis. There is also some cellulitis in the index finger. 3. Apparent separation of the middle finger flexor digitorum profundus tendon from the base of the distal phalanx by about 6 mm, with mild hyperflexion of the distal phalanx resulting. 4. Mild flexor pollicis longus tenosynovitis. Electronically Signed   By: Gaylyn Rong M.D.   On: 06/08/2017 20:38   Dg Finger Middle Right  Result Date: 06/08/2017 CLINICAL DATA:  Diabetic patient with an infection the right long finger. Initial encounter. EXAM: RIGHT  MIDDLE FINGER 2+V COMPARISON:  Plain films right long finger 05/27/2017. FINDINGS: Soft tissues of the long finger are markedly swollen. There is bony destructive change throughout the distal phalanx consistent with osteomyelitis. Chip fracture off the base of the distal phalanx on the radial side is noted. Small osseous fragment along the PIP joint is chronic and likely due to remote injury. IMPRESSION: Osteomyelitis throughout  the distal phalanx of the long finger. Soft tissue swelling consistent with cellulitis also identified. Electronically Signed   By: Drusilla Kannerhomas  Dalessio M.D.   On: 06/08/2017 11:26   Ellwood DenseRumball, Lakeyta Vandenheuvel, DO 06/09/2017, 7:19 AM PGY-1, [facility] FPTS Intern pager: [phone], text pages welcome

## 2017-06-10 ENCOUNTER — Encounter (HOSPITAL_COMMUNITY): Payer: Self-pay | Admitting: General Practice

## 2017-06-10 ENCOUNTER — Other Ambulatory Visit: Payer: Self-pay

## 2017-06-10 DIAGNOSIS — M869 Osteomyelitis, unspecified: Secondary | ICD-10-CM

## 2017-06-10 DIAGNOSIS — M14672 Charcot's joint, left ankle and foot: Secondary | ICD-10-CM

## 2017-06-10 LAB — BASIC METABOLIC PANEL
Anion gap: 13 (ref 5–15)
BUN: 6 mg/dL (ref 6–20)
CALCIUM: 9.5 mg/dL (ref 8.9–10.3)
CO2: 23 mmol/L (ref 22–32)
CREATININE: 0.72 mg/dL (ref 0.44–1.00)
Chloride: 104 mmol/L (ref 101–111)
GLUCOSE: 140 mg/dL — AB (ref 65–99)
Potassium: 4.9 mmol/L (ref 3.5–5.1)
Sodium: 140 mmol/L (ref 135–145)

## 2017-06-10 LAB — GLUCOSE, CAPILLARY
GLUCOSE-CAPILLARY: 132 mg/dL — AB (ref 65–99)
GLUCOSE-CAPILLARY: 148 mg/dL — AB (ref 65–99)
Glucose-Capillary: 105 mg/dL — ABNORMAL HIGH (ref 65–99)
Glucose-Capillary: 134 mg/dL — ABNORMAL HIGH (ref 65–99)

## 2017-06-10 LAB — VANCOMYCIN, TROUGH: VANCOMYCIN TR: 11 ug/mL — AB (ref 15–20)

## 2017-06-10 LAB — CBC
HEMATOCRIT: 32.5 % — AB (ref 36.0–46.0)
Hemoglobin: 10.4 g/dL — ABNORMAL LOW (ref 12.0–15.0)
MCH: 28.7 pg (ref 26.0–34.0)
MCHC: 32 g/dL (ref 30.0–36.0)
MCV: 89.5 fL (ref 78.0–100.0)
PLATELETS: 252 10*3/uL (ref 150–400)
RBC: 3.63 MIL/uL — ABNORMAL LOW (ref 3.87–5.11)
RDW: 14.9 % (ref 11.5–15.5)
WBC: 6.3 10*3/uL (ref 4.0–10.5)

## 2017-06-10 MED ORDER — VANCOMYCIN HCL IN DEXTROSE 1-5 GM/200ML-% IV SOLN
1000.0000 mg | Freq: Three times a day (TID) | INTRAVENOUS | Status: DC
  Administered 2017-06-10 – 2017-06-12 (×5): 1000 mg via INTRAVENOUS
  Filled 2017-06-10 (×7): qty 200

## 2017-06-10 NOTE — Progress Notes (Signed)
Attending made aware of the OR tomorrow , no new order given.

## 2017-06-10 NOTE — Progress Notes (Signed)
Subjective:  She had trouble getting through to [place] on our phone line as outpatient and she mistakenly believed that osteomyelitis HAD been seen on original Xray on January 31st --it was not   Antibiotics:  Anti-infectives (From admission, onward)   Start     Dose/Rate Route Frequency Ordered Stop   06/09/17 0100  vancomycin (VANCOCIN) 1,250 mg in sodium chloride 0.9 % 250 mL IVPB     1,250 mg 166.7 mL/hr over 90 Minutes Intravenous Every 12 hours 06/08/17 1231     06/08/17 1230  vancomycin (VANCOCIN) 2,500 mg in sodium chloride 0.9 % 500 mL IVPB     2,500 mg 250 mL/hr over 120 Minutes Intravenous  Once 06/08/17 1202 06/08/17 1453      Medications: Scheduled Meds: . DULoxetine  60 mg Oral Daily  . insulin aspart  0-5 Units Subcutaneous QHS  . insulin aspart  0-9 Units Subcutaneous TID WC   Continuous Infusions: . vancomycin 1,250 mg (06/10/17 1239)   PRN Meds:.HYDROcodone-acetaminophen, ondansetron **OR** ondansetron (ZOFRAN) IV, polyethylene glycol    Objective: Weight change:   Intake/Output Summary (Last 24 hours) at 06/10/2017 1249 Last data filed at 06/10/2017 0948 Gross per 24 hour  Intake 720 ml  Output 1075 ml  Net -355 ml   Blood pressure 126/69, pulse 69, temperature 97.9 F (36.6 C), temperature source Oral, resp. rate 18, height 5\' 4"  (1.626 m), weight 278 lb 14.1 oz (126.5 kg), SpO2 92 %. Temp:  [97.9 F (36.6 C)-98.8 F (37.1 C)] 97.9 F (36.6 C) (02/14 0501) Pulse Rate:  [69-86] 69 (02/14 0501) Resp:  [18] 18 (02/14 0501) BP: (121-127)/(49-69) 126/69 (02/14 0501) SpO2:  [92 %-98 %] 92 % (02/14 0501)  Physical Exam: General: Alert and awake, oriented x3, not in any acute distress. HEENT: anicteric sclera,  EOMI CVS regular rate, normal  Chest:no wheezing,  Abdomen: softnondistended Neuro: nonfocal  06/08/17:       Skin 06/09/17:    06/09/17: index finger          06/10/17: index and middle finger with reduced  erythema      CBC:  CBC Latest Ref Rng & Units 06/10/2017 06/09/2017 06/08/2017  WBC 4.0 - 10.5 K/uL 6.3 6.1 7.2  Hemoglobin 12.0 - 15.0 g/dL 10.4(L) 10.3(L) 11.0(L)  Hematocrit 36.0 - 46.0 % 32.5(L) 32.7(L) 34.9(L)  Platelets 150 - 400 K/uL 252 260 256      BMET Recent Labs    06/09/17 0541 06/10/17 0629  NA 140 140  K 4.5 4.9  CL 104 104  CO2 24 23  GLUCOSE 121* 140*  BUN 9 6  CREATININE 0.71 0.72  CALCIUM 9.0 9.5     Liver Panel  Recent Labs    06/08/17 1057  PROT 6.7  ALBUMIN 3.3*  AST 18  ALT 14  ALKPHOS 78  BILITOT 0.7       Sedimentation Rate No results for input(s): ESRSEDRATE in the last 72 hours. C-Reactive Protein No results for input(s): CRP in the last 72 hours.  Micro Results: Recent Results (from the past 720 hour(s))  Wound culture     Status: Abnormal   Collection Time: 05/27/17  1:39 PM  Result Value Ref Range Status   MICRO NUMBER: [id number]  Final   SPECIMEN QUALITY: ADEQUATE  Final   SOURCE: FINGER  Final   STATUS: FINAL  Final   GRAM STAIN:   Final    No white blood cells seen No epithelial  cells seen Rare Gram positive cocci in pairs   ISOLATE 1: methicillin resistant Staphylococcus aureus (A)  Final      Susceptibility   Methicillin resistant staphylococcus aureus - AEROBIC CULT, GRAM STAIN POSITIVE 1    VANCOMYCIN 1 Sensitive     CIPROFLOXACIN >=8 Resistant     CLINDAMYCIN <=0.25 Sensitive     LEVOFLOXACIN 4 Resistant     ERYTHROMYCIN >=8 Resistant     GENTAMICIN <=0.5 Sensitive     OXACILLIN* NR Resistant      * Oxacillin-resistant staphylococci are resistant toall currently available beta-lactam antimicrobialagents including penicillins, beta lactam/beta-lactamase inhibitor combinations, and cephems withstaphylococcal indications, including Cefazolin.    TETRACYCLINE <=1 Sensitive     TRIMETH/SULFA* <=10 Sensitive      * Oxacillin-resistant staphylococci are resistant toall currently available beta-lactam  antimicrobialagents including penicillins, beta lactam/beta-lactamase inhibitor combinations, and cephems withstaphylococcal indications, including Cefazolin.Legend:S = Susceptible  I = IntermediateR = Resistant  NS = Not susceptible* = Not tested  NR = Not reported**NN = See antimicrobic comments  Blood culture (routine x 2)     Status: None (Preliminary result)   Collection Time: 06/08/17 11:05 AM  Result Value Ref Range Status   Specimen Description BLOOD RIGHT ANTECUBITAL  Final   Special Requests IN PEDIATRIC BOTTLE Blood Culture adequate volume  Final   Culture   Final    NO GROWTH 1 DAY Performed at [facility], [address] [place], [place] [address]    Report Status PENDING  Incomplete  Blood culture (routine x 2)     Status: None (Preliminary result)   Collection Time: 06/08/17 11:58 AM  Result Value Ref Range Status   Specimen Description BLOOD LEFT FOREARM  Final   Special Requests   Final    BOTTLES DRAWN AEROBIC AND ANAEROBIC Blood Culture adequate volume   Culture   Final    NO GROWTH 1 DAY Performed at [facility], [address] [place], [place] [address]    Report Status PENDING  Incomplete    Studies/Results: Mr Hand Right W Wo Contrast  Result Date: 06/08/2017 CLINICAL DATA:  Osteomyelitis of the right middle finger. History of peripheral neuropathy in diabetes. History of gout. EXAM: MRI OF THE RIGHT HAND WITHOUT AND WITH CONTRAST TECHNIQUE: Multiplanar, multisequence MR imaging of the right hand was performed before and after the administration of intravenous contrast. CONTRAST:  20mL MULTIHANCE GADOBENATE DIMEGLUMINE 529 MG/ML IV SOLN COMPARISON:  06/08/2017 radiographs FINDINGS: Bones/Joint/Cartilage Bony destructive findings an active osteomyelitis in the distal phalanx of the middle finger, with poor definition of the cortex. Along the anterior margin of the eroded tuft there is a 0.8 by 0.4 by 0.8 cm hypoenhancing region which could reflect  necrotic tissue or abscess, image 21/13. Extensive inflammatory phlegmon volar to the distal phalanx. Low-level edema in the marrow of the distal phalanx index finger compatible with early osteomyelitis. No appreciable osteomyelitis elsewhere in the hand. Ligaments Thickened collateral ligaments at the distal phalanx of the middle finger, likely due to local inflammation. Muscles and Tendons The flexor digitorum profundus tendon appears to be separated from the distal phalanx of the middle finger by about 6 mm on image 21/8, with mild hyperflexion of the distal phalanx. Mild flexor pollicis longus tenosynovitis, image 22/6. Soft tissues Diffuse prominent subcutaneous edema in the middle phalanx tracking back into the dorsum of the hand, with associated enhancement, compatible with cellulitis. There is a lesser degree of cellulitis in the index finger.  IMPRESSION: 1. Osteomyelitis of the distal phalanx middle finger, with volar erosion of the tuft and a small suspected abscess or focus of necrotic tissue in this region of tuft erosion measuring 0.8 by 0.4 by 0.8 cm. Extensive surrounding cellulitis in the finger extending back into the dorsum of the hand. Local inflammatory phlegmon especially volar to the distal phalanx middle finger. 2. Low-level edema and enhancement in the distal phalanx index finger compatible with early osteomyelitis. There is also some cellulitis in the index finger. 3. Apparent separation of the middle finger flexor digitorum profundus tendon from the base of the distal phalanx by about 6 mm, with mild hyperflexion of the distal phalanx resulting. 4. Mild flexor pollicis longus tenosynovitis. Electronically Signed   By: Gaylyn Rong M.D.   On: 06/08/2017 20:38      Assessment/Plan:  INTERVAL HISTORY: awaiting surgery   Principal Problem:   Osteomyelitis of finger of right hand (HCC) Active Problems:   Controlled type 2 diabetes with neuropathy (HCC)   MRSA  infection    [name] is a 52 y.o. female with osteomyelitis involving the distal phalanx of the middle finger and possible osteomyelitis in the distal phalanx of the index finger.  She had MRSA isolated on culture from purulent material from the soft tissue taken in January.  We will continue her on IV vancomycin.  She is going to have surgery potentially on Friday on the total finger.  Question whether or not she also needs an intervention on her index finger?  I would strongly recommend getting intraoperative cultures so that we have deep specimens to guide therapy not just the culture that we had from the purulent material from the abscess in January.     LOS: 2 days   Acey Lav 06/10/2017, 12:49 PM

## 2017-06-10 NOTE — Discharge Summary (Addendum)
[facility] Discharge Summary  [name] Medical record number: [id number] Date of birth: Jun 26, 1965 Age: 52 y.o. Gender: female Date of Admission: 06/08/2017  Date of Discharge: 06/12/2017 Admitting Physician: Alveda Reasons, MD  Primary Care Provider: Chesley Noon, MD Consultants: Orthopedics, Infectious Disease  Indication for Hospitalization:   Osteomyelitis of the 3rd right digit  Discharge Diagnoses/Problem List:  T2DM Osteomyelitis of 3rd digit of right hand MRSA infection Idiopathic peripheral neuropathy/Charcot foot Osteoarthritis of the right knee Morbid Obesity Overactive Bladder OSA  Disposition: home  Discharge Condition: medically stable  Discharge Exam:  Gen: Alert and Oriented x 3, NAD HEENT: Normocephalic, atraumatic, PERRLA, EOMI CV: RRR, no murmurs, normal S1, S2 split, +2 pulses dorsalis pedis bilaterally Resp: CTAB, no wheezing, rales, or rhonchi, comfortable work of breathing Abd: non-distended, non-tender, soft, +bs in all four quadrants MSK: FROM in all four extremities; right 3rd digit distal end s/p amputation. Wrapped in gauze. No noted drainage. PICC line in place in left arm Ext: no clubbing, cyanosis, trace edema bilaterally Skin: warm, dry, intact, no rashes  Brief Hospital Course:  Atiyah Park [name] a 52 y.o.femalepresenting with R middle finger osteomyelitis. PMH is significant forT2DM, overactive bladder, OA, remote h/o gout, charcot foot, neuropathy. She was treated with IV Vancomycin. MRI showed with osteomyelitis of the right middle finger and possible early osteo of R index finger. 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 She was discharged home with a PICC and IV Vancomycin for a total of 8 weeks.  Patient also noted to have new systolic murmur on exam. ECHO did not show any vegetations or valvular abnormalities.  Issues for Follow  Up:  1. She will need follow up outpatient for management of her IV antibiotics. Outpatient parenteral Antibiotic Therapy order was placed for patient prior to discharge. 2. She will need follow up pending the results of her deep wound culture results.  Significant Procedures:   Amputation of the distal end of the right 3rd digit on 06/11/17.  Significant Labs and Imaging:  Recent Labs  Lab 06/10/17 0629 06/11/17 0631 06/12/17 1126  WBC 6.3 6.0 6.8  HGB 10.4* 10.7* 10.2*  HCT 32.5* 33.9* 31.7*  PLT 252 278 272   Recent Labs  Lab 06/10/17 0629 06/11/17 0631 06/12/17 1126  NA 140 139 137  K 4.9 4.7 4.0  CL 104 102 101  CO2 23 25 26   GLUCOSE 140* 164* 168*  BUN 6 12 9   CREATININE 0.72 0.66 0.64  CALCIUM 9.5 9.5 9.1   Blood cultures: no growth ECHO 65-70%, G2DD, PA peak pressure 36mHg   Results/Tests Pending at Time of Discharge: deep surgical wound cultures  Discharge Medications:  Allergies as of 06/12/2017   No Known Allergies     Medication List    STOP taking these medications   neomycin-bacitracin-polymyxin ointment Commonly known as:  NEOSPORIN     TAKE these medications   B-12 1000 MCG Tabs Take 1,000 mcg by mouth daily.   diclofenac 75 MG EC tablet Commonly known as:  VOLTAREN Take 75 mg by mouth 2 (two) times daily.   DULoxetine 60 MG capsule Commonly known as:  CYMBALTA Take 60 mg by mouth daily.   Exenatide ER 2 MG Pen Inject 2 mg into the skin every Tuesday.   glipiZIDE 10 MG 24 hr tablet Commonly known as:  GLUCOTROL XL Take 10 mg by mouth daily with breakfast.  glucosamine-chondroitin 500-400 MG tablet Take 2 tablets by mouth daily.   HYDROcodone-acetaminophen 10-325 MG tablet Commonly known as:  NORCO Take 1 tablet by mouth every 6 (six) hours as needed for moderate pain.   JANUMET XR 50-1000 MG Tb24 Generic drug:  SitaGLIPtin-MetFORMIN HCl Take 1 tablet by mouth at bedtime.   mirabegron ER 50 MG Tb24 tablet Commonly known  as:  MYRBETRIQ Take 50 mg by mouth every morning.   multivitamin with minerals Tabs tablet Take 1 tablet by mouth daily.   ondansetron 8 MG disintegrating tablet Commonly known as:  ZOFRAN-ODT Take 8 mg by mouth every 8 (eight) hours as needed for nausea/vomiting. DISSOLVE IN THE MOUTH   oxyCODONE 5 MG immediate release tablet Commonly known as:  Oxy IR/ROXICODONE Take 1-2 tablets (5-10 mg total) by mouth every 6 (six) hours as needed for severe pain or breakthrough pain.   vancomycin IVPB Inject 1,750 mg into the vein every 12 (twelve) hours. Indication:  Osteomyelitis Last Day of Therapy:  08/02/17 Labs - Sunday/Monday:  CBC/D, BMP, and vancomycin trough. Labs - Thursday:  BMP and vancomycin trough Labs - Every other week:  ESR and CRP   VITAMIN A PO Take 5,000 mg by mouth daily.            Home Infusion Instuctions  (From admission, onward)        Start     Ordered   06/12/17 0000  Home infusion instructions [facility] May follow [facility] Dosing Protocol; May administer Cathflo as needed to maintain patency of vascular access device.; Flushing of vascular access device: per [facility] Protocol: 0.9% NaCl pre/post medica...    Question Answer Comment  Instructions May follow [facility] Dosing Protocol   Instructions May administer Cathflo as needed to maintain patency of vascular access device.   Instructions Flushing of vascular access device: per [facility] Protocol: 0.9% NaCl pre/post medication administration and prn patency; Heparin 100 u/ml, 5ml for implanted ports and Heparin 10u/ml, 5ml for all other central venous catheters.   Instructions May follow [facility] Anaphylaxis Protocol for First Dose Administration in the home: 0.9% NaCl at 25-50 ml/hr to maintain IV access for protocol meds. Epinephrine 0.3 ml IV/IM PRN and Benadryl 25-50 IV/IM PRN s/s of anaphylaxis.   Instructions [facility] Infusion Coordinator (RN) to assist per patient IV care needs in the home  PRN.      02 /16/19 1308      Discharge Instructions: Please refer to Patient Instructions section of EMR for full details.  Patient was counseled important signs and symptoms that should prompt return to medical care, changes in medications, dietary instructions, activity restrictions, and follow up appointments.   Follow-Up Appointments: Follow-up Information    Milly Jakob, MD. Call.   Specialty:  Orthopedic Surgery Why:  on Monday to make appt for this Wed or Thursday Contact information: [place] [address] [phone]        Chesley Noon, MD. Schedule an appointment as soon as possible for a visit.   Specialty:  Family Medicine Contact information: [place] [address] [place], [facility]-Home Follow up.   Specialty:  [place] Why:  A representative from [facility] will contact you to arrange start date and time for Home Health RN.  Contact information: [address] [place] [address] [phone]           Mayo,  Pete Pelt, MD 06/16/2017, 5:00 PM PGY-3, [place]

## 2017-06-10 NOTE — Progress Notes (Signed)
Family Medicine Teaching Service Daily Progress Note Intern Pager: [phone]  [name] Medical record number: [id number] Date of birth: 04/27/1965 Age: 52 y.o. Gender: female  Primary Care Provider: Eartha InchBadger, Michael C, MD Consultants: Gaylord Shihrtho, ID Code Status: Full  Pt Overview and Major Events to Date:  2/12 - admitted for R finger OM  Assessment and Plan: [name] Braverlexandra R Vlcek is a 52 y.o. female presenting with R middle finger osteomyelitis. 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 MRI also consistent with OM of R middle finger and possible early OM of R index also. VSS and afebrile overnight. Continues to not have leukocytosis. Initial lactic acidosis improved 3.71>2.0 with IV hydration. Blood cultures drawn, although after initiation of outpatient antibiotics, NGx24h. S/p Keflex (chronic), Augmentin, Bactrim. Superficial wound culture positive for MRSA. ID and Ortho consulted, appreciate recs. Plan is to continue IV antibiotics and reassess this evening for need for surgery for Friday, at that time will hope to get deep wound cultures to further drive antibiotic management. On exam, continues not to have drainage noted with marked improvement swelling, stable surrounding erythema. R index finger wound also noted, no drainage, no erythema.  - continue Vancomycin (2/12 - ), dose per pharmacy - Orthopedics consulted, appreciate recs - ID consulted, appreciate recs - Follow-up blood cultures (obtained after patient has been on abx as an outpatient) - tylenol PRN mild pain - zofran PRN nausea - ambulate patient - contact precautions  Systolic Murmur, new: 3/6 LUSB systolic murmur noted on admission. Not mentioned previously in the chart and patient states she has never been told she has a  murmur. Concerning in the setting of wound cultures positive for MRSA. ECHO this admission EF 65-70% without vegetations or valvular abnormalities. - Blood cultures pending - NGx24h   Type 2 Diabetes  Chronic, well controlled. Patient states has been receiving treatment for diabetes since 2017. Last A1c 5.6 05/10/17 per recent PCP note. On Janumet 50-1000mg , glipizide 10mg , bydureon once weekly (been on about 1 year) at home. Denies missed doses. CBG 155 this am 2/13. 3u novolog received over last 24 hours. - sSSI due to insulin naivety  - hold home meds  Charcot foot  Neuropathy Chronic, stable. Has neuropathy in all fingers and from mid-calf down including feet bilaterally. Also with Charcot foot with multiple surgeries in L foot ranging from 2014-2018 on chart review, last surgery 06/2016. Follows with Dr. Remer MachoEasley at [facility], last seen 05/20/17. At that time, deformity appeared stable on imaging with no ulcerations. Following with [employer] for CROW boot and custom orthotics. - monitor  Osteoarthritis Takes Cymbalta and Diclofenac daily for OA pain and neuropathy, Norco as needed. Sees Dr. Talbert CageMuzaffar with [facility]. Last visit 06/03/17, at that time was undergoing workup with labs and imaging for inflammatory arthritis due to symptoms of stiffness in spine and joints. - holding home diclofenac while admitted and on Vanc - continue home cymbalta - PRN tylenol  Overactive bladder Takes Myrbetriq at home for urinary urgency. Recently out of med and awaiting refill. - continue home myrbetriq  H/o gout Remote. Noted post-operatively. Was on allopurinol for a while but hasn't been on in some time. - monitor   OSA: stable - CPAP qhs  FEN/GI: Carb modified Prophylaxis: SCDs  Disposition: continue inpatient management of OM  Subjective:  Patient  states she is feeling better day by day. States had improved appetite last night and food "tasted better" than it has. Understands the  plan, no questions at this time.  Objective: Temp:  [97.9 F (36.6 C)-98.8 F (37.1 C)] 97.9 F (36.6 C) (02/14 0501) Pulse Rate:  [69-86] 69 (02/14 0501) Resp:  [18] 18 (02/14 0501) BP: (121-127)/(49-69) 126/69 (02/14 0501) SpO2:  [92 %-98 %] 92 % (02/14 0501) Physical Exam: General: pleasant female, lying in bed, in NAD Cardiovascular: RRR, no murmurs, rubs or gallops Respiratory: CTAB, no wheezes/rales/rhonchi Abdomen: soft, obese abdomen, +BS, non tender to palption Extremities: warm and well perfused. R middle finger tip with stable erythema from yesterday, R hand swelling much improved. R index finger with dried wound at tip, no surrounding erythema. No drainage from either site noted.  Laboratory: Recent Labs  Lab 06/08/17 1057 06/09/17 0541 06/10/17 0629  WBC 7.2 6.1 6.3  HGB 11.0* 10.3* 10.4*  HCT 34.9* 32.7* 32.5*  PLT 256 260 252   Recent Labs  Lab 06/08/17 1057 06/09/17 0541 06/10/17 0629  NA 139 140 140  K 5.0 4.5 4.9  CL 105 104 104  CO2 20* 24 23  BUN 10 9 6   CREATININE 0.84 0.71 0.72  CALCIUM 9.4 9.0 9.5  PROT 6.7  --   --   BILITOT 0.7  --   --   ALKPHOS 78  --   --   ALT 14  --   --   AST 18  --   --   GLUCOSE 213* 121* 140*   Imaging/Diagnostic Tests: Mr Hand Right W Wo Contrast  Result Date: 06/08/2017 CLINICAL DATA:  Osteomyelitis of the right middle finger. History of peripheral neuropathy in diabetes. History of gout. EXAM: MRI OF THE RIGHT HAND WITHOUT AND WITH CONTRAST TECHNIQUE: Multiplanar, multisequence MR imaging of the right hand was performed before and after the administration of intravenous contrast. CONTRAST:  20mL MULTIHANCE GADOBENATE DIMEGLUMINE 529 MG/ML IV SOLN COMPARISON:  06/08/2017 radiographs FINDINGS: Bones/Joint/Cartilage Bony destructive findings an active osteomyelitis in the distal phalanx of the middle finger, with poor definition of the cortex. Along the anterior margin of the eroded tuft there is a 0.8 by 0.4 by 0.8  cm hypoenhancing region which could reflect necrotic tissue or abscess, image 21/13. Extensive inflammatory phlegmon volar to the distal phalanx. Low-level edema in the marrow of the distal phalanx index finger compatible with early osteomyelitis. No appreciable osteomyelitis elsewhere in the hand. Ligaments Thickened collateral ligaments at the distal phalanx of the middle finger, likely due to local inflammation. Muscles and Tendons The flexor digitorum profundus tendon appears to be separated from the distal phalanx of the middle finger by about 6 mm on image 21/8, with mild hyperflexion of the distal phalanx. Mild flexor pollicis longus tenosynovitis, image 22/6. Soft tissues Diffuse prominent subcutaneous edema in the middle phalanx tracking back into the dorsum of the hand, with associated enhancement, compatible with cellulitis. There is a lesser degree of cellulitis in the index finger. IMPRESSION: 1. Osteomyelitis of the distal phalanx middle finger, with volar erosion of the tuft and a small suspected abscess or focus of necrotic tissue in this region of tuft erosion measuring 0.8 by 0.4 by 0.8 cm. Extensive surrounding cellulitis in the finger extending back into the dorsum of the hand. Local inflammatory phlegmon especially volar to the distal phalanx middle finger. 2. Low-level edema and enhancement in the distal phalanx index finger compatible with early osteomyelitis. There is also some  cellulitis in the index finger. 3. Apparent separation of the middle finger flexor digitorum profundus tendon from the base of the distal phalanx by about 6 mm, with mild hyperflexion of the distal phalanx resulting. 4. Mild flexor pollicis longus tenosynovitis. Electronically Signed   By: Gaylyn Rong M.D.   On: 06/08/2017 20:38   Dg Finger Middle Right  Result Date: 06/08/2017 CLINICAL DATA:  Diabetic patient with an infection the right long finger. Initial encounter. EXAM: RIGHT MIDDLE FINGER 2+V  COMPARISON:  Plain films right long finger 05/27/2017. FINDINGS: Soft tissues of the long finger are markedly swollen. There is bony destructive change throughout the distal phalanx consistent with osteomyelitis. Chip fracture off the base of the distal phalanx on the radial side is noted. Small osseous fragment along the PIP joint is chronic and likely due to remote injury. IMPRESSION: Osteomyelitis throughout the distal phalanx of the long finger. Soft tissue swelling consistent with cellulitis also identified. Electronically Signed   By: Drusilla Kanner M.D.   On: 06/08/2017 11:26   Ellwood Dense, DO 06/10/2017, 7:32 AM PGY-1, [facility] FPTS Intern pager: [phone], text pages welcome

## 2017-06-10 NOTE — Progress Notes (Signed)
Right LF further improved overnight with regard to redness, prox swelling,  Pain, etc. R IF with superficial skin changes, minimal redness, appears to have no present surgical indications  Plan: 1. Continue IV antibiotics 2. To OR tomorrow for debridement vs. Amputation of R LF, depending upon OR findings.  Will obtain deep intra-op cultures 3. From hand surgery perspective, could possibly D/C Sat afternoon or Sunday.  I will comment further after surgery tomorrow. Of course, d/c will be dependent upon suitable outpatient antibiotic plan.

## 2017-06-11 ENCOUNTER — Inpatient Hospital Stay (HOSPITAL_COMMUNITY): Payer: BLUE CROSS/BLUE SHIELD | Admitting: Certified Registered"

## 2017-06-11 ENCOUNTER — Encounter (HOSPITAL_COMMUNITY): Payer: Self-pay

## 2017-06-11 ENCOUNTER — Encounter (HOSPITAL_COMMUNITY): Admission: EM | Disposition: A | Discharge status: Home Health | Payer: Self-pay | Source: Home / Self Care

## 2017-06-11 DIAGNOSIS — M86141 Other acute osteomyelitis, right hand: Secondary | ICD-10-CM

## 2017-06-11 DIAGNOSIS — E1140 Type 2 diabetes mellitus with diabetic neuropathy, unspecified: Secondary | ICD-10-CM

## 2017-06-11 DIAGNOSIS — G609 Hereditary and idiopathic neuropathy, unspecified: Secondary | ICD-10-CM

## 2017-06-11 DIAGNOSIS — E1169 Type 2 diabetes mellitus with other specified complication: Principal | ICD-10-CM

## 2017-06-11 HISTORY — PX: AMPUTATION: SHX166

## 2017-06-11 LAB — PROTIME-INR
INR: 0.95
PROTHROMBIN TIME: 12.6 s (ref 11.4–15.2)

## 2017-06-11 LAB — BASIC METABOLIC PANEL
Anion gap: 12 (ref 5–15)
BUN: 12 mg/dL (ref 6–20)
CO2: 25 mmol/L (ref 22–32)
CREATININE: 0.66 mg/dL (ref 0.44–1.00)
Calcium: 9.5 mg/dL (ref 8.9–10.3)
Chloride: 102 mmol/L (ref 101–111)
Glucose, Bld: 164 mg/dL — ABNORMAL HIGH (ref 65–99)
POTASSIUM: 4.7 mmol/L (ref 3.5–5.1)
Sodium: 139 mmol/L (ref 135–145)

## 2017-06-11 LAB — CBC
HCT: 33.9 % — ABNORMAL LOW (ref 36.0–46.0)
HEMOGLOBIN: 10.7 g/dL — AB (ref 12.0–15.0)
MCH: 28.5 pg (ref 26.0–34.0)
MCHC: 31.6 g/dL (ref 30.0–36.0)
MCV: 90.4 fL (ref 78.0–100.0)
PLATELETS: 278 10*3/uL (ref 150–400)
RBC: 3.75 MIL/uL — AB (ref 3.87–5.11)
RDW: 15.1 % (ref 11.5–15.5)
WBC: 6 10*3/uL (ref 4.0–10.5)

## 2017-06-11 LAB — GLUCOSE, CAPILLARY
GLUCOSE-CAPILLARY: 118 mg/dL — AB (ref 65–99)
GLUCOSE-CAPILLARY: 105 mg/dL — AB (ref 65–99)
GLUCOSE-CAPILLARY: 231 mg/dL — AB (ref 65–99)
Glucose-Capillary: 125 mg/dL — ABNORMAL HIGH (ref 65–99)

## 2017-06-11 LAB — SURGICAL PCR SCREEN
MRSA, PCR: POSITIVE — AB
Staphylococcus aureus: POSITIVE — AB

## 2017-06-11 SURGERY — AMPUTATION DIGIT
Anesthesia: Monitor Anesthesia Care | Site: Finger | Laterality: Right

## 2017-06-11 MED ORDER — LIDOCAINE HCL (PF) 2 % IJ SOLN
INTRAMUSCULAR | Status: DC | PRN
  Administered 2017-06-11: 20 mL via INTRADERMAL

## 2017-06-11 MED ORDER — 0.9 % SODIUM CHLORIDE (POUR BTL) OPTIME
TOPICAL | Status: DC | PRN
  Administered 2017-06-11: 1000 mL

## 2017-06-11 MED ORDER — MIDAZOLAM HCL 2 MG/2ML IJ SOLN
INTRAMUSCULAR | Status: AC
  Filled 2017-06-11: qty 2

## 2017-06-11 MED ORDER — SODIUM CHLORIDE 0.9% FLUSH
10.0000 mL | INTRAVENOUS | Status: DC | PRN
  Administered 2017-06-12: 10 mL
  Filled 2017-06-11: qty 40

## 2017-06-11 MED ORDER — PROPOFOL 500 MG/50ML IV EMUL
INTRAVENOUS | Status: DC | PRN
  Administered 2017-06-11: 50 ug/kg/min via INTRAVENOUS

## 2017-06-11 MED ORDER — MIDAZOLAM HCL 5 MG/5ML IJ SOLN
INTRAMUSCULAR | Status: DC | PRN
  Administered 2017-06-11: 2 mg via INTRAVENOUS

## 2017-06-11 MED ORDER — MUPIROCIN 2 % EX OINT
TOPICAL_OINTMENT | Freq: Two times a day (BID) | CUTANEOUS | Status: DC
  Administered 2017-06-11 – 2017-06-12 (×3): via NASAL
  Filled 2017-06-11: qty 22

## 2017-06-11 MED ORDER — LACTATED RINGERS IV SOLN
INTRAVENOUS | Status: DC | PRN
  Administered 2017-06-11: 14:00:00 via INTRAVENOUS

## 2017-06-11 MED ORDER — PROPOFOL 10 MG/ML IV BOLUS
INTRAVENOUS | Status: DC | PRN
  Administered 2017-06-11: 40 mg via INTRAVENOUS

## 2017-06-11 MED ORDER — LACTATED RINGERS IV SOLN
INTRAVENOUS | Status: DC
  Administered 2017-06-11 (×2): via INTRAVENOUS

## 2017-06-11 MED ORDER — BUPIVACAINE-EPINEPHRINE (PF) 0.5% -1:200000 IJ SOLN
INTRAMUSCULAR | Status: AC
  Filled 2017-06-11: qty 30

## 2017-06-11 MED ORDER — MORPHINE SULFATE (PF) 4 MG/ML IV SOLN
2.0000 mg | Freq: Once | INTRAVENOUS | Status: AC
  Administered 2017-06-11: 2 mg via INTRAVENOUS
  Filled 2017-06-11: qty 1

## 2017-06-11 MED ORDER — LIDOCAINE HCL 0.5 % IJ SOLN
INTRAMUSCULAR | Status: DC | PRN
  Administered 2017-06-11: 50 mL

## 2017-06-11 MED ORDER — PROPOFOL 10 MG/ML IV BOLUS
INTRAVENOUS | Status: AC
  Filled 2017-06-11: qty 20

## 2017-06-11 MED ORDER — VITAMIN B-12 1000 MCG PO TABS
1000.0000 ug | ORAL_TABLET | Freq: Every day | ORAL | Status: DC
  Administered 2017-06-12: 1000 ug via ORAL
  Filled 2017-06-11: qty 2
  Filled 2017-06-11: qty 1

## 2017-06-11 MED ORDER — LIDOCAINE HCL 2 % IJ SOLN
INTRAMUSCULAR | Status: AC
  Filled 2017-06-11: qty 20

## 2017-06-11 MED ORDER — FENTANYL CITRATE (PF) 250 MCG/5ML IJ SOLN
INTRAMUSCULAR | Status: AC
  Filled 2017-06-11: qty 5

## 2017-06-11 MED ORDER — LIDOCAINE HCL (CARDIAC) 20 MG/ML IV SOLN
INTRAVENOUS | Status: DC | PRN
  Administered 2017-06-11: 40 mg via INTRAVENOUS

## 2017-06-11 MED ORDER — LIDOCAINE 2% (20 MG/ML) 5 ML SYRINGE
INTRAMUSCULAR | Status: AC
  Filled 2017-06-11: qty 5

## 2017-06-11 SURGICAL SUPPLY — 44 items
BANDAGE COBAN STERILE 2 (GAUZE/BANDAGES/DRESSINGS) IMPLANT
BLADE AVERAGE 25MMX9MM (BLADE)
BLADE AVERAGE 25X9 (BLADE) IMPLANT
BNDG COHESIVE 1X5 TAN STRL LF (GAUZE/BANDAGES/DRESSINGS) ×3 IMPLANT
BNDG COHESIVE 4X5 TAN NS LF (GAUZE/BANDAGES/DRESSINGS) IMPLANT
BNDG CONFORM 2 STRL LF (GAUZE/BANDAGES/DRESSINGS) IMPLANT
BNDG CONFORM 3 STRL LF (GAUZE/BANDAGES/DRESSINGS) ×3 IMPLANT
BNDG ELASTIC 2X5.8 VLCR STR LF (GAUZE/BANDAGES/DRESSINGS) IMPLANT
BNDG ESMARK 4X9 LF (GAUZE/BANDAGES/DRESSINGS) ×3 IMPLANT
BNDG GAUZE ELAST 4 BULKY (GAUZE/BANDAGES/DRESSINGS) IMPLANT
CHLORAPREP W/TINT 26ML (MISCELLANEOUS) ×3 IMPLANT
CORDS BIPOLAR (ELECTRODE) ×3 IMPLANT
DRAPE SURG 17X23 STRL (DRAPES) ×3 IMPLANT
DRSG EMULSION OIL 3X3 NADH (GAUZE/BANDAGES/DRESSINGS) ×3 IMPLANT
ELECT REM PT RETURN 9FT ADLT (ELECTROSURGICAL)
ELECTRODE REM PT RTRN 9FT ADLT (ELECTROSURGICAL) IMPLANT
GAUZE SPONGE 4X4 12PLY STRL (GAUZE/BANDAGES/DRESSINGS) IMPLANT
GAUZE SPONGE 4X4 12PLY STRL LF (GAUZE/BANDAGES/DRESSINGS) ×3 IMPLANT
GAUZE XEROFORM 1X8 LF (GAUZE/BANDAGES/DRESSINGS) IMPLANT
GLOVE BIO SURGEON STRL SZ 6.5 (GLOVE) ×2 IMPLANT
GLOVE BIO SURGEON STRL SZ7 (GLOVE) ×3 IMPLANT
GLOVE BIO SURGEON STRL SZ7.5 (GLOVE) ×6 IMPLANT
GLOVE BIO SURGEONS STRL SZ 6.5 (GLOVE) ×1
GLOVE BIOGEL PI IND STRL 7.0 (GLOVE) ×2 IMPLANT
GLOVE BIOGEL PI IND STRL 8 (GLOVE) ×2 IMPLANT
GLOVE BIOGEL PI INDICATOR 7.0 (GLOVE) ×4
GLOVE BIOGEL PI INDICATOR 8 (GLOVE) ×4
GOWN STRL REUS W/ TWL XL LVL3 (GOWN DISPOSABLE) ×3 IMPLANT
GOWN STRL REUS W/TWL XL LVL3 (GOWN DISPOSABLE) ×6
KIT BASIN OR (CUSTOM PROCEDURE TRAY) ×3 IMPLANT
NEEDLE HYPO 25X1 1.5 SAFETY (NEEDLE) ×3 IMPLANT
PACK ORTHO EXTREMITY (CUSTOM PROCEDURE TRAY) ×3 IMPLANT
PAD CAST 4YDX4 CTTN HI CHSV (CAST SUPPLIES) IMPLANT
PADDING CAST ABS 4INX4YD NS (CAST SUPPLIES)
PADDING CAST ABS COTTON 4X4 ST (CAST SUPPLIES) IMPLANT
PADDING CAST COTTON 4X4 STRL (CAST SUPPLIES)
PENCIL BUTTON HOLSTER BLD 10FT (ELECTRODE) IMPLANT
SUT ETHILON 4 0 PS 2 18 (SUTURE) IMPLANT
SUT MNCRL AB 4-0 PS2 18 (SUTURE) IMPLANT
SUT PROLENE 4 0 P 3 18 (SUTURE) ×3 IMPLANT
SUT VICRYL RAPIDE 4/0 PS 2 (SUTURE) IMPLANT
SYR 10ML LL (SYRINGE) ×3 IMPLANT
TOWEL OR 17X24 6PK STRL BLUE (TOWEL DISPOSABLE) ×3 IMPLANT
UNDERPAD 30X30 (UNDERPADS AND DIAPERS) ×3 IMPLANT

## 2017-06-11 NOTE — Op Note (Signed)
06/11/2017  2:55 PM  PATIENT:  [name]  52 y.o. female  PRE-OPERATIVE DIAGNOSIS: Right long finger bone and soft tissue infection distally  POST-OPERATIVE DIAGNOSIS:  Same  PROCEDURE: Right long finger amputation through distal aspect of P2  SURGEON: Rayvon Char. Grandville Silos, MD  PHYSICIAN ASSISTANT: Morley Kos, OPA-C  ANESTHESIA:  MAC  SPECIMENS:  Swabs to micro and fingertip to pathology  DRAINS:   None  EBL:  less than 50 mL  PREOPERATIVE INDICATIONS:  [name] is a  52 y.o. female with established infection of the right long finger, with distal phlegmon and likely osteomyelitis of the distal phalanx, already with separation retraction of the deep flexor tendon.  The risks benefits and alternatives were discussed with the patient preoperatively including but not limited to the risks of infection, bleeding, nerve injury, cardiopulmonary complications, the need for revision surgery, among others, and the patient verbalized understanding and consented to proceed.  OPERATIVE IMPLANTS: None  OPERATIVE PROCEDURE:  After receiving prophylactic antibiotics, the patient was escorted to the operative theatre and placed in a supine position.  A surgical "time-out" was performed during which the planned procedure, proposed operative site, and the correct patient identity were compared to the operative consent and agreement confirmed by the circulating nurse according to current facility policy.  Digital block was performed by me, without epinephrine.  Following application of a tourniquet to the operative extremity, the exposed skin was prepped with Chloraprep and draped in the usual sterile fashion.  The limb was exsanguinated with an Esmarch bandage and the tourniquet inflated to approximately 124mHg higher than systolic BP.  With pressure on the pulp and fingertip, pus was expressible from deep to the nailbed out the distal open sinus at the tip.  Decision was made to  proceed with amputation.  A transverse incision was made halfway around the circumference dorsally, proximal to the nail forming elements.  Volarly the incision was slightly more distal, tapering at the mid axial lines proximally to create unbalanced fishmouth incision.  Deep cultures were obtained.  Distal phalanx was in at least 2 pieces, and the DIP capsule was incised.  Specimen was passed off.  Additionally, the volar plate and is much distal flexor tendon is could be excised was done so.  Bone cutters were used to cut the distal phalanx through the neck, the distal portion remaining smoothed with a rongeur.  There was no expressible purulence at the stump.  The stump was copiously irrigated, and the tourniquet released.  Some additional hemostasis was obtained with direct pressure and desquamating epidermis was removed circumferentially at the stump.  The remaining dermis appeared edematous, but would hold sutures.  The amputation was closed with interrupted 4-0 Prolene sutures and a digital dressing applied loosely.  DISPOSITION: She will return to the floor for continued infection management.  She will likely need some protracted antibiotics to cure this infection, and the possibility of further surgical debridement remains, depending upon the clinical course.

## 2017-06-11 NOTE — Anesthesia Preprocedure Evaluation (Addendum)
Anesthesia Evaluation  Patient identified by MRN, date of birth, ID band Patient awake    Reviewed: Allergy & Precautions, NPO status , Patient's Chart, lab work & pertinent test results  Airway Mallampati: III       Dental no notable dental hx. (+) Teeth Intact   Pulmonary neg pulmonary ROS, sleep apnea ,    Pulmonary exam normal        Cardiovascular hypertension,  Rhythm:Regular Rate:Normal  Wall thickness was   increased in a pattern of moderate LVH. Systolic function was   vigorous. The estimated ejection fraction was in the range of 65%   to 70%. Wall motion was normal   Neuro/Psych negative neurological ROS  negative psych ROS   GI/Hepatic negative GI ROS, Neg liver ROS,   Endo/Other  negative endocrine ROSdiabetes  Renal/GU negative Renal ROS  negative genitourinary   Musculoskeletal   Abdominal   Peds  Hematology negative hematology ROS (+)   Anesthesia Other Findings   Reproductive/Obstetrics negative OB ROS                            Anesthesia Physical Anesthesia Plan  ASA: III  Anesthesia Plan: MAC   Post-op Pain Management:    Induction:   PONV Risk Score and Plan: Treatment may vary due to age or medical condition, Ondansetron and Dexamethasone  Airway Management Planned: Mask, Natural Airway and Nasal Cannula  Additional Equipment:   Intra-op Plan:   Post-operative Plan:   Informed Consent: I have reviewed the patients History and Physical, chart, labs and discussed the procedure including the risks, benefits and alternatives for the proposed anesthesia with the patient or authorized representative who has indicated his/her understanding and acceptance.     Plan Discussed with: CRNA and Anesthesiologist  Anesthesia Plan Comments:         Anesthesia Quick Evaluation

## 2017-06-11 NOTE — Transfer of Care (Signed)
Immediate Anesthesia Transfer of Care Note  Patient: [name]  Procedure(s) Performed: RIGHT LONG FINGER AMPUTATION DIGIT (Right )  Patient Location: PACU  Anesthesia Type:MAC  Level of Consciousness: awake and alert   Airway & Oxygen Therapy: Patient Spontanous Breathing and Patient connected to face mask oxygen  Post-op Assessment: Report given to RN and Post -op Vital signs reviewed and stable  Post vital signs: Reviewed and stable  Last Vitals:  Vitals:   06/11/17 1304 06/11/17 1505  BP: (!) 141/63 (P) 133/82  Pulse: 82   Resp: 18 (P) 16  Temp: 37.1 C (P) 36.7 C  SpO2: 100% (P) 100%    Last Pain:  Vitals:   06/11/17 1304  TempSrc: Oral  PainSc:       Patients Stated Pain Goal: 2 (67/34/19 3790)  Complications: No apparent anesthesia complications

## 2017-06-11 NOTE — Progress Notes (Signed)
Per pt, she will place herself on CPAP when she is ready. RT checked water level, advised pt to call if any further assistance is needed.

## 2017-06-11 NOTE — Progress Notes (Signed)
Family Medicine Teaching Service Daily Progress Note Intern Pager: [phone]  [name] Medical record number: [id number] Date of birth: 12/16/1965 Age: 52 y.o. Gender: female  Primary Care Provider: Eartha InchBadger, Michael C, MD Consultants: Gaylord Shihrtho, ID Code Status: Full  Pt Overview and Major Events to Date:  2/12 - admitted for R finger OM  Assessment and Plan: [name] Braverlexandra R Dyck is a 52 y.o. female presenting with R middle finger osteomyelitis. 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 Outpatient superficial wound culture positive for MRSA. S/p outpatient treatment with Keflex (chronic), Augmentin (1/31 - 2/4), Bactrim (2/4 - 2/12). Continues to not have leukocytosis. VSS and afebrile overnight.  On exam, continues not to have drainage noted with marked improvement in swelling from yesterday, stable surrounding erythema. R index finger wound also noted, no drainage, no erythema.  - continue Vancomycin (2/12 - ), dose per pharmacy - Orthopedics recs - continue IV abx, surgical debridement vs. Amputation today, will get deep wound cultures  - ID recs - continue IV antibiotics, obtain deep wound cultures - Blood cultures - NGx48h - tylenol PRN mild pain - zofran PRN nausea - ambulate patient - contact precautions   Type 2 Diabetes  Chronic, well controlled. On Janumet 50-1000mg , glipizide 10mg , bydureon once weekly at home. CBG 148 this am 2/15. 2u novolog received over last 24 hours. - sSSI due to insulin naivety  - hold home meds  Charcot foot  Neuropathy Chronic, stable. Has baseline neuropathy in all fingers and from mid-calf down including feet bilaterally.  - monitor  Osteoarthritis Takes Cymbalta and Diclofenac daily for OA pain and neuropathy, Norco as needed.  - holding home diclofenac while admitted and on Vanc - continue home  cymbalta - PRN tylenol  Overactive bladder Takes Myrbetriq at home for urinary urgency.  - continue home myrbetriq  H/o gout Remote. Noted post-operatively. Was on allopurinol for a while but hasn't been on in some time. - monitor   OSA: stable - CPAP qhs  FEN/GI: Carb modified Prophylaxis: SCDs  Disposition: continue inpatient management of OM  Subjective:  Patient doing well today. Awaiting surgery. No complaints.  Objective: Temp:  [98.4 F (36.9 C)-99.1 F (37.3 C)] 98.4 F (36.9 C) (02/15 0451) Pulse Rate:  [85-90] 85 (02/15 0451) Resp:  [15-18] 15 (02/15 0451) BP: (104-132)/(45-59) 104/49 (02/15 0451) SpO2:  [99 %] 99 % (02/15 0451) Physical Exam: General: pleasant female, lying in bed, in NAD Cardiovascular: RRR, no murmurs, rubs or gallops Respiratory: CTAB, no wheezes/rales/rhonchi Abdomen: soft, obese abdomen, +BS, non tender to palption Extremities: warm and well perfused. R middle finger tip with slightly improved erythema from yesterday, R hand swelling much improved. R index finger with dried wound at tip, no surrounding erythema. No drainage from either site noted.  Laboratory: Recent Labs  Lab 06/08/17 1057 06/09/17 0541 06/10/17 0629  WBC 7.2 6.1 6.3  HGB 11.0* 10.3* 10.4*  HCT 34.9* 32.7* 32.5*  PLT 256 260 252   Recent Labs  Lab 06/08/17 1057 06/09/17 0541 06/10/17 0629  NA 139 140 140  K 5.0 4.5 4.9  CL 105 104 104  CO2 20* 24 23  BUN 10 9 6   CREATININE 0.84 0.71 0.72  CALCIUM 9.4 9.0 9.5  PROT 6.7  --   --   BILITOT 0.7  --   --   ALKPHOS 78  --   --  ALT 14  --   --   AST 18  --   --   GLUCOSE 213* 121* 140*   Imaging/Diagnostic Tests: No results found. Ellwood Dense, DO 06/11/2017, 7:21 AM PGY-1, [facility] FPTS Intern pager: [phone], text pages welcome

## 2017-06-11 NOTE — Progress Notes (Signed)
Subjective:  No new complaints.  Antibiotics:  Anti-infectives (From admission, onward)   Start     Dose/Rate Route Frequency Ordered Stop   06/10/17 2000  vancomycin (VANCOCIN) IVPB 1000 mg/200 mL premix     1,000 mg 200 mL/hr over 60 Minutes Intravenous Every 8 hours 06/10/17 1332     06/09/17 0100  vancomycin (VANCOCIN) 1,250 mg in sodium chloride 0.9 % 250 mL IVPB  Status:  Discontinued     1,250 mg 166.7 mL/hr over 90 Minutes Intravenous Every 12 hours 06/08/17 1231 06/10/17 1332   06/08/17 1230  vancomycin (VANCOCIN) 2,500 mg in sodium chloride 0.9 % 500 mL IVPB     2,500 mg 250 mL/hr over 120 Minutes Intravenous  Once 06/08/17 1202 06/08/17 1453      Medications: Scheduled Meds: . DULoxetine  60 mg Oral Daily  . insulin aspart  0-5 Units Subcutaneous QHS  . insulin aspart  0-9 Units Subcutaneous TID WC  . mupirocin ointment   Nasal BID   Continuous Infusions: . vancomycin 1,000 mg (06/11/17 1225)   PRN Meds:.HYDROcodone-acetaminophen, ondansetron **OR** ondansetron (ZOFRAN) IV, polyethylene glycol, sodium chloride flush    Objective: Weight change:   Intake/Output Summary (Last 24 hours) at 06/11/2017 1305 Last data filed at 06/11/2017 0447 Gross per 24 hour  Intake 930 ml  Output -  Net 930 ml   Blood pressure (!) 104/49, pulse 85, temperature 98.4 F (36.9 C), temperature source Oral, resp. rate 15, height _0  (1.626 m), weight 278 lb 14.1 oz (126.5 kg), SpO2 99 %. Temp:  [98.4 F (36.9 C)-99.1 F (37.3 C)] 98.4 F (36.9 C) (02/15 0451) Pulse Rate:  [85-90] 85 (02/15 0451) Resp:  [15-18] 15 (02/15 0451) BP: (104-132)/(45-59) 104/49 (02/15 0451) SpO2:  [99 %] 99 % (02/15 0451)  Physical Exam: General: Alert and awake, oriented x3, not in any acute distress. HEENT: anicteric sclera,  EOMI CVS regular rate, normal  Chest:no wheezing,  Abdomen: softnondistended Neuro: nonfocal  06/08/17:       Skin 06/09/17:    06/09/17: index  finger          06/10/17: index and middle finger with reduced erythema    Fingers were in dressing today.  CBC:  CBC Latest Ref Rng & Units 06/11/2017 06/10/2017 06/09/2017  WBC 4.0 - 10.5 K/uL 6.0 6.3 6.1  Hemoglobin 12.0 - 15.0 g/dL 10.7(L) 10.4(L) 10.3(L)  Hematocrit 36.0 - 46.0 % 33.9(L) 32.5(L) 32.7(L)  Platelets 150 - 400 K/uL 278 252 260      BMET Recent Labs    06/10/17 0629 06/11/17 0631  NA 140 139  K 4.9 4.7  CL 104 102  CO2 23 25  GLUCOSE 140* 164*  BUN 6 12  CREATININE 0.72 0.66  CALCIUM 9.5 9.5     Liver Panel  No results for input(s): PROT, ALBUMIN, AST, ALT, ALKPHOS, BILITOT, BILIDIR, IBILI in the last 72 hours.     Sedimentation Rate No results for input(s): ESRSEDRATE in the last 72 hours. C-Reactive Protein No results for input(s): CRP in the last 72 hours.  Micro Results: Recent Results (from the past 720 hour(s))  Wound culture     Status: Abnormal   Collection Time: 05/27/17  1:39 PM  Result Value Ref Range Status   MICRO NUMBER: [id number]  Final   SPECIMEN QUALITY: ADEQUATE  Final   SOURCE: FINGER  Final   STATUS: FINAL  Final   GRAM STAIN:   Final  No white blood cells seen No epithelial cells seen Rare Gram positive cocci in pairs   ISOLATE 1: methicillin resistant Staphylococcus aureus (A)  Final      Susceptibility   Methicillin resistant staphylococcus aureus - AEROBIC CULT, GRAM STAIN POSITIVE 1    VANCOMYCIN 1 Sensitive     CIPROFLOXACIN >=8 Resistant     CLINDAMYCIN <=0.25 Sensitive     LEVOFLOXACIN 4 Resistant     ERYTHROMYCIN >=8 Resistant     GENTAMICIN <=0.5 Sensitive     OXACILLIN* NR Resistant      * Oxacillin-resistant staphylococci are resistant toall currently available beta-lactam antimicrobialagents including penicillins, beta lactam/beta-lactamase inhibitor combinations, and cephems withstaphylococcal indications, including Cefazolin.    TETRACYCLINE <=1 Sensitive     TRIMETH/SULFA* <=10 Sensitive        * Oxacillin-resistant staphylococci are resistant toall currently available beta-lactam antimicrobialagents including penicillins, beta lactam/beta-lactamase inhibitor combinations, and cephems withstaphylococcal indications, including Cefazolin.Legend:S = Susceptible  I = IntermediateR = Resistant  NS = Not susceptible* = Not tested  NR = Not reported**NN = See antimicrobic comments  Blood culture (routine x 2)     Status: None (Preliminary result)   Collection Time: 06/08/17 11:05 AM  Result Value Ref Range Status   Specimen Description BLOOD RIGHT ANTECUBITAL  Final   Special Requests IN PEDIATRIC BOTTLE Blood Culture adequate volume  Final   Culture   Final    NO GROWTH 2 DAYS Performed at [facility], [place] [address] [place], [place] [address]    Report Status PENDING  Incomplete  Blood culture (routine x 2)     Status: None (Preliminary result)   Collection Time: 06/08/17 11:58 AM  Result Value Ref Range Status   Specimen Description BLOOD LEFT FOREARM  Final   Special Requests   Final    BOTTLES DRAWN AEROBIC AND ANAEROBIC Blood Culture adequate volume   Culture   Final    NO GROWTH 2 DAYS Performed at [facility], [place] [address] [place], [place] [address]    Report Status PENDING  Incomplete  Surgical pcr screen     Status: Abnormal   Collection Time: 06/11/17 12:41 AM  Result Value Ref Range Status   MRSA, PCR POSITIVE (A) NEGATIVE Final   Staphylococcus aureus POSITIVE (A) NEGATIVE Final    Comment: RESULT CALLED TO, READ BACK BY AND VERIFIED WITH: Carron Brazen AT 4193 06/14/17 BY L BENFIELD (NOTE) The Xpert SA Assay (FDA approved for NASAL specimens in patients 8 years of age and older), is one component of a comprehensive surveillance program. It is not intended to diagnose infection nor to guide or monitor treatment. Performed at [facility], [place] [address], [place],  [address]     Studies/Results: No results  found.    Assessment/Plan:  INTERVAL HISTORY: Surgery later today.   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 female with osteomyelitis involving the distal phalanx of the middle finger and possible osteomyelitis in the distal phalanx of the index finger.  She had MRSA isolated on culture from purulent material from the soft tissue taken in January.  We will continue her on IV vancomycin.  She is going to have surgery today.  Greatly appreciate Dr. Grandville Silos taken to the operating room  and obtaining deep cultures.   I have put an order for a PICC line as well as an O PAT consult.  Tentative plan if she only grows methicillin-resistant Staphylococcus aureus from intraoperative cultures like she grew from soft tissue cultures would be 8 weeks of IV antibiotics.  Diagnosis: Osteomyelitis of the fingers  Culture Result: Soft tissue culture MRSA deep bone culture pending  No Known Allergies  OPAT Orders Discharge antibiotics: Vancomycin Per pharmacy protocol  Aim for Vancomycin trough 15-20 (unless otherwise indicated)  Duration: 8 weeks End Date:  April 8th, 2019  [facility] Care Per Protocol:  BIWEEKLY LABS while on Vancomycin:  _x_ BMP w GFR  Labs weekly while on IV antibiotics: _x_ CBC with differential  _x_ CRP _x_ ESR _x_ Vancomycin trough  _x_ Please pull PIC at completion of IV antibiotics __ Please leave PIC in place until doctor has seen patient or been notified  Fax weekly labs to [phone]  Clinic Follow Up Appt:  4 weeks with Dr. Johnnye Sima or myself..  I will follow her cultures over the weekend and if she is discharged we will continue to follow them into next week to see if anything else is isolated.  We can certainly ask her home infusion company to add another  antibiotic if she grows another organism from deep culture.     LOS: 3 days   Alcide Evener 06/11/2017, 1:05 PM

## 2017-06-11 NOTE — Progress Notes (Signed)
Peripherally Inserted Central Catheter/Midline Placement  The IV Nurse has discussed with the patient and/or persons authorized to consent for the patient, the purpose of this procedure and the potential benefits and risks involved with this procedure.  The benefits include less needle sticks, lab draws from the catheter, and the patient may be discharged home with the catheter. Risks include, but not limited to, infection, bleeding, blood clot (thrombus formation), and puncture of an artery; nerve damage and irregular heartbeat and possibility to perform a PICC exchange if needed/ordered by physician.  Alternatives to this procedure were also discussed.  Bard Power PICC patient education guide, fact sheet on infection prevention and patient information card has been provided to patient /or left at bedside.    PICC/Midline Placement Documentation  PICC / Midline Single Lumen 02/01/13 PICC Right Basilic (Active)     PICC Single Lumen 06/11/17 PICC Left Basilic 44 cm 1 cm (Active)  Indication for Insertion or Continuance of Line Home intravenous therapies (PICC only) 06/11/2017 12:00 PM  Exposed Catheter (cm) 1 cm 06/11/2017 12:00 PM  Site Assessment Clean;Dry;Intact 06/11/2017 12:00 PM  Line Status Flushed;Blood return noted 06/11/2017 12:00 PM  Dressing Type Transparent 06/11/2017 12:00 PM  Dressing Status Clean;Dry;Intact;Antimicrobial disc in place 06/11/2017 12:00 PM  Dressing Intervention New dressing 06/11/2017 12:00 PM  Dressing Change Due 06/18/17 06/11/2017 12:00 PM       Reginia FortsLumban, Desarie Feild Albarece 06/11/2017, 12:39 PM

## 2017-06-12 LAB — CBC
HCT: 31.7 % — ABNORMAL LOW (ref 36.0–46.0)
Hemoglobin: 10.2 g/dL — ABNORMAL LOW (ref 12.0–15.0)
MCH: 28.7 pg (ref 26.0–34.0)
MCHC: 32.2 g/dL (ref 30.0–36.0)
MCV: 89.3 fL (ref 78.0–100.0)
PLATELETS: 272 10*3/uL (ref 150–400)
RBC: 3.55 MIL/uL — AB (ref 3.87–5.11)
RDW: 14.7 % (ref 11.5–15.5)
WBC: 6.8 10*3/uL (ref 4.0–10.5)

## 2017-06-12 LAB — BASIC METABOLIC PANEL
ANION GAP: 10 (ref 5–15)
BUN: 9 mg/dL (ref 6–20)
CALCIUM: 9.1 mg/dL (ref 8.9–10.3)
CO2: 26 mmol/L (ref 22–32)
Chloride: 101 mmol/L (ref 101–111)
Creatinine, Ser: 0.64 mg/dL (ref 0.44–1.00)
GFR calc Af Amer: >60 mL/min (ref >60)
GLUCOSE: 168 mg/dL — AB (ref 65–99)
POTASSIUM: 4 mmol/L (ref 3.5–5.1)
Sodium: 137 mmol/L (ref 135–145)

## 2017-06-12 LAB — GLUCOSE, CAPILLARY
Glucose-Capillary: 133 mg/dL — ABNORMAL HIGH (ref 65–99)
Glucose-Capillary: 134 mg/dL — ABNORMAL HIGH (ref 65–99)

## 2017-06-12 LAB — VANCOMYCIN, TROUGH: Vancomycin Tr: 14 ug/mL — ABNORMAL LOW (ref 15–20)

## 2017-06-12 MED ORDER — OXYCODONE HCL 5 MG PO TABS: 5.0000 mg | ORAL_TABLET | Freq: Four times a day (QID) | ORAL | 0 refills | Status: DC | PRN

## 2017-06-12 MED ORDER — OXYCODONE HCL 5 MG PO TABS
5.0000 mg | ORAL_TABLET | Freq: Four times a day (QID) | ORAL | Status: DC | PRN
  Administered 2017-06-12 (×2): 5 mg via ORAL
  Filled 2017-06-12: qty 2
  Filled 2017-06-12: qty 1

## 2017-06-12 MED ORDER — HEPARIN SOD (PORK) LOCK FLUSH 100 UNIT/ML IV SOLN
250.0000 [IU] | INTRAVENOUS | Status: AC | PRN
  Administered 2017-06-12: 250 [IU]

## 2017-06-12 MED ORDER — VANCOMYCIN HCL 10 G IV SOLR
1750.0000 mg | Freq: Two times a day (BID) | INTRAVENOUS | Status: DC
  Administered 2017-06-12: 1750 mg via INTRAVENOUS
  Filled 2017-06-12 (×2): qty 1750

## 2017-06-12 MED ORDER — VANCOMYCIN IV (FOR PTA / DISCHARGE USE ONLY): 1750.0000 mg | Freq: Two times a day (BID) | INTRAVENOUS | 0 refills | Status: DC

## 2017-06-12 NOTE — Progress Notes (Signed)
PHARMACY CONSULT NOTE FOR:  OUTPATIENT  PARENTERAL ANTIBIOTIC THERAPY (OPAT)  Indication: MRSA Osteomyelitis of fingers Regimen: Vancomycin 1750mg  IV every 12 hours End date: 08/02/17  IV antibiotic discharge orders are pended. To discharging provider:  please sign these orders via discharge navigator,  Select New Orders & click on the button choice - Manage This Unsigned Work.     Thank you for allowing pharmacy to be a part of this patient's care.  [name], [name] 06/12/2017, 12:55 PM

## 2017-06-12 NOTE — Progress Notes (Signed)
Discharge instruction reviewed with the patient, pt was given her prescription. H/H was set up with her and [facility] to meet with her in the AM to start her  Tx. Pt was given her discharge instructions Pt was taken to front via w/c to meet her husband

## 2017-06-12 NOTE — Progress Notes (Signed)
Pharmacy Antibiotic Note  Garald Braverlexandra R Haag is a 52 y.o. female admitted on 06/08/2017 with osteomyelitis of finger.  Pharmacy has been consulted for Vancomycin dosing.  Vancomycin trough is 14 on 1g IV every 8 hours. Will likely accumulate - but will change patient to an every 12 hours to work better for discharge. SCR remains stable.   Plan:  Change Vancomycin to 1750mg  IV every 12 hours.  Monitor renal function and levels outpatient.  Will enter OPAT  Height: 5\' 4"  (162.6 cm) Weight: 278 lb 14.1 oz (126.5 kg) IBW/kg (Calculated) : 54.7  Temp (24hrs), Avg:98.4 F (36.9 C), Min:98.1 F (36.7 C), Max:98.8 F (37.1 C)  Recent Labs  Lab 06/08/17 1057 06/08/17 1115 06/08/17 1329 06/08/17 1707 06/09/17 0541 06/10/17 0629 06/10/17 1150 06/11/17 0631 06/12/17 1126  WBC 7.2  --   --   --  6.1 6.3  --  6.0 6.8  CREATININE 0.84  --   --   --  0.71 0.72  --  0.66 0.64  LATICACIDVEN  --  3.71* 2.13* 2.0*  --   --   --   --   --   VANCOTROUGH  --   --   --   --   --   --  11*  --  14*    Estimated Creatinine Clearance: 109.5 mL/min (by C-G formula based on SCr of 0.64 mg/dL).    No Known Allergies  Antimicrobials this admission: Vancomycin 2/12 >>  Dose adjustments this admission: 2/14 VT = 11 on 1250mg  IV q12h >>incr to 1g q8h 2/16 VT = 14 on 1g IV q8h >> change to 1750 q12 for discharge and follow  Microbiology results: 2/15 PCR- positive 2/12 Blood >> ngtd x3 days 2/15 Wound >>  Thank you for allowing pharmacy to be a part of this patient's care.  Fayne NorrieMillen, Joeann Steppe Brown 06/12/2017 12:48 PM

## 2017-06-12 NOTE — Progress Notes (Signed)
This Clinical research associatewriter and MD addressed pt pain throughout night.  Patient very upset MD only gave her 2mg  of morphine for her pain, after she had been covered 4 hours prior.

## 2017-06-12 NOTE — Care Management Note (Signed)
Case Management Note  Patient Details  Name: [name] MRN: [id number] Date of Birth: 07/25/1965  Subjective/Objective:  52 yr old female admitted with osteomyelitis of 3rd finger.                 Action/Plan: Case manager spoke with patient concerning discharge plan and Home Health RN need.She will go home with PICC and on IV Vancomycin.  Choice for Home Health Agency was offered, patient has used [facility] previously and would like to do so now. Referral was called to Shaune LeeksJermaine Jenkins, [facility] Liaison.     Expected Discharge Date:  06/12/17               Expected Discharge Plan:  Home w Home Health Services  In-House Referral:  NA  Discharge planning Services  CM Consult  Post Acute Care Choice:  Home Health Choice offered to:  Patient  DME Arranged:  IV pump/equipment DME Agency:  [facility]  HH Arranged:  RN, IV Antibiotics HH Agency:  [facility]  Status of Service:  Completed, signed off  If discussed at Long Length of Stay Meetings, dates discussed:    Additional Comments:  Durenda GuthrieBrady, Foye Haggart Naomi, RN 06/12/2017, 2:28 PM

## 2017-06-12 NOTE — Discharge Instructions (Addendum)
Wound care in shower as Dr. Janee Mornhompson discussed, covering with light dry dressing until no further drainage, then may stop dressing the wound.   Please follow up with your primary care doctor and infectious disease doctor as you will need a long course of IV antibiotics.

## 2017-06-12 NOTE — Progress Notes (Signed)
06-11-17 R LF amp thru P2  Had some increased pain overnight. Less erythema in remaining digit this am  Recs:  1. OK from hand surgery perspective to be d/c home once suitable antibiotic plan in place.  There certainly remains microscopic infection in the remaining stump tissues to warrant a more protracted course of antibiotics than might sometimes be recommended following amputations.  2. Dry dressings daily until no drainage,  May shower but not soak/submerge wound in still water 3. F/u with me mid-week for wound check 4. I will Rx some supplemental oxycodone for immediate postop pain.  Neil Crouchave Miron Marxen, MD Hand Surgery Mobile [phone]

## 2017-06-12 NOTE — Progress Notes (Signed)
Family Medicine Teaching Service Daily Progress Note Intern Pager: [phone]  [name] Medical record number: [id number] Date of birth: [birth year] Age: 52 y.o. Gender: female  Primary Care Provider: [name], MD Consultants: Gaylord Shih, ID Code Status: Full  Pt Overview and Major Events to Date:  2/12 - admitted for R finger OM  Assessment and Plan: [name] is a 52 y.o. female presenting with R middle finger osteomyelitis. PMH is significant for T2DM, overactive bladder, OA, remote h/o gout, charcot foot, neuropathy  R middle finger osteomyelitis of distal phalanx s/p amputation: Finger wrapped in gauze and tape this am. Patient reports no significant drainage but is having pain. Outpatient superficial wound culture positive for MRSA. S/p outpatient treatment with Keflex (chronic), Augmentin (1/31 - 2/4), Bactrim (2/4 - 2/12). This am still no leukocytosis, remains afebrile. PICC line in place. - Orthopedics recs - amputation on 2/15 of distal end of right 3rd digit  - ID recs - continue IV Vancomycin (2/12-) dosed per pharmacy for total of 8 weeks, f/u deep wound cultures pending - Blood cultures - NG x 3 days - tylenol PRN mild pain - Norco 10-325mg  q6 prn for moderate to severe pain - zofran PRN nausea - ambulate patient - contact precautions   Type 2 Diabetes  Chronic, well controlled. On Janumet 50-1000mg , glipizide 10mg , bydureon once weekly at home. CBG 148 this am 2/15. 2u novolog received over last 24 hours. - sSSI due to insulin naivety  - hold home meds  Charcot foot  Neuropathy Chronic, stable. Has baseline neuropathy in all fingers and from mid-calf down including feet bilaterally.  - monitor  Osteoarthritis Takes Cymbalta and Diclofenac daily for OA pain and neuropathy, Norco as needed.  - holding home diclofenac while admitted and on Vanc - continue home cymbalta - PRN tylenol  Overactive bladder Takes Myrbetriq at home  for urinary urgency.  - continue home myrbetriq  H/o gout Remote. Noted post-operatively. Was on allopurinol for a while but hasn't been on in some time. - monitor   OSA: stable - CPAP qhs  FEN/GI: Carb modified Prophylaxis: SCDs  Disposition: continue inpatient management of OM  Subjective:  Patient states she is doing well but had continued "throbbing pain" in her right hand last night. She states her home dose of Norco is not controlling the pain and her one dose of Morphine did very little. She states she talked to Dr. Janee Morn who will add Vicodin to her pain medications so she can alternate between pain medications.  Otherwise she denies, fever, chills, chest pain, SOB, abdominal pain, nausea, vomiting, diarrhea, or constipation. Her appetite is good. She would like to go home today if possible.  Objective: Temp:  [98.1 F (36.7 C)-98.8 F (37.1 C)] 98.4 F (36.9 C) (02/16 0444) Pulse Rate:  [78-95] 87 (02/16 0444) Resp:  [15-20] 18 (02/16 0444) BP: (118-141)/(61-82) 137/75 (02/16 0444) SpO2:  [98 %-100 %] 98 % (02/16 0444) Weight:  [278 lb 14.1 oz (126.5 kg)] 278 lb 14.1 oz (126.5 kg) (02/15 1356) Physical Exam:  Gen: Alert and Oriented x 3, NAD HEENT: Normocephalic, atraumatic, PERRLA, EOMI CV: RRR, no murmurs, normal S1, S2 split, +2 pulses dorsalis pedis bilaterally Resp: CTAB, no wheezing, rales, or rhonchi, comfortable work of breathing Abd: non-distended, non-tender, soft, +bs in all four quadrants MSK: FROM in all four extremities; right 3rd digit distal end s/p amputation. Wrapped in gauze. No noted drainage. PICC line in place in left arm Ext:  no clubbing, cyanosis, trace edema bilaterally Skin: warm, dry, intact, no rashes  Laboratory: Recent Labs  Lab 06/09/17 0541 06/10/17 0629 06/11/17 0631  WBC 6.1 6.3 6.0  HGB 10.3* 10.4* 10.7*  HCT 32.7* 32.5* 33.9*  PLT 260 252 278   Recent Labs  Lab 06/08/17 1057 06/09/17 0541 06/10/17 0629  06/11/17 0631  NA 139 140 140 139  K 5.0 4.5 4.9 4.7  CL 105 104 104 102  CO2 20* 24 23 25   BUN 10 9 6 12   CREATININE 0.84 0.71 0.72 0.66  CALCIUM 9.4 9.0 9.5 9.5  PROT 6.7  --   --   --   BILITOT 0.7  --   --   --   ALKPHOS 78  --   --   --   ALT 14  --   --   --   AST 18  --   --   --   GLUCOSE 213* 121* 140* 164*   Imaging/Diagnostic Tests: 2/12 - X-ray of right middle finger: IMPRESSION: Osteomyelitis throughout the distal phalanx of the long finger. Soft tissue swelling consistent with cellulitis also identified.  2/12 - MRI of Right Hand: IMPRESSION: 1. Osteomyelitis of the distal phalanx middle finger, with volar erosion of the tuft and a small suspected abscess or focus of necrotic tissue in this region of tuft erosion measuring 0.8 by 0.4 by 0.8 cm. Extensive surrounding cellulitis in the finger extending back into the dorsum of the hand. Local inflammatory phlegmon especially volar to the distal phalanx middle finger. 2. Low-level edema and enhancement in the distal phalanx index finger compatible with early osteomyelitis. There is also some cellulitis in the index finger. 3. Apparent separation of the middle finger flexor digitorum profundus tendon from the base of the distal phalanx by about 6 mm, with mild hyperflexion of the distal phalanx resulting. 4. Mild flexor pollicis longus tenosynovitis.  [name], [name] Vega, DO 06/12/2017, 7:19 AM PGY-1, [facility] FPTS Intern pager: [phone], text pages welcome

## 2017-06-13 LAB — CULTURE, BLOOD (ROUTINE X 2)
CULTURE: NO GROWTH
Culture: NO GROWTH
SPECIAL REQUESTS: ADEQUATE
Special Requests: ADEQUATE

## 2017-06-14 ENCOUNTER — Encounter (HOSPITAL_COMMUNITY): Payer: Self-pay | Admitting: Orthopedic Surgery

## 2017-06-14 NOTE — Anesthesia Postprocedure Evaluation (Signed)
Anesthesia Post Note  Patient: [name]  Procedure(s) Performed: RIGHT LONG FINGER AMPUTATION DIGIT (Right Finger)     Patient location during evaluation: PACU Anesthesia Type: MAC Level of consciousness: awake and alert Pain management: pain level controlled Vital Signs Assessment: post-procedure vital signs reviewed and stable Respiratory status: spontaneous breathing, nonlabored ventilation, respiratory function stable and patient connected to nasal cannula oxygen Cardiovascular status: stable and blood pressure returned to baseline Postop Assessment: no apparent nausea or vomiting Anesthetic complications: no    Last Vitals:  Vitals:   06/12/17 0444 06/12/17 1403  BP: 137/75 (!) 124/55  Pulse: 87 91  Resp: 18 18  Temp: 36.9 C 36.9 C  SpO2: 98% 97%    Last Pain:  Vitals:   06/12/17 1556  TempSrc:   PainSc: 3                  Barnet Glasgow

## 2017-06-16 LAB — AEROBIC/ANAEROBIC CULTURE (SURGICAL/DEEP WOUND): GRAM STAIN: NONE SEEN

## 2017-06-16 LAB — AEROBIC/ANAEROBIC CULTURE W GRAM STAIN (SURGICAL/DEEP WOUND)

## 2017-06-18 ENCOUNTER — Other Ambulatory Visit: Payer: Self-pay | Admitting: Pharmacist

## 2017-06-22 ENCOUNTER — Other Ambulatory Visit: Payer: Self-pay | Admitting: Pharmacist

## 2017-06-24 ENCOUNTER — Encounter: Payer: Self-pay | Admitting: Infectious Diseases

## 2017-06-25 ENCOUNTER — Telehealth: Payer: Self-pay | Admitting: Pharmacist

## 2017-06-25 NOTE — Telephone Encounter (Signed)
Agree Sounds reasonable to hold to me. I get nervous w vancomycin. I would have low threshold to go to cubicin

## 2017-06-25 NOTE — Telephone Encounter (Signed)
Let's hold and see where she settles out thanks

## 2017-06-25 NOTE — Telephone Encounter (Signed)
Patient is on vancomycin for osteomyelitis of her finger with MRSA. I have been watching her labs closely over the last week. Here are her SCr and Vanc trough results:  2/22 SCr 1.19 and VT 27.3 [employer]([facility] pharmacist decreased vanc dose)  2/26 SCr 1.40 and VT 33.8 (hold vanc until otherwise informed)  2/28 SCr 1.43 and VT 10.1 (still holding vanc)  Discussed with Larita FifeLynn this morning.  Patient has been holding vanc since Tuesday. He is running daptomycin through her insurance just to see if she can afford it. Will route to Dr. Daiva EvesVan Dam who recently saw her in the hospital and Dr. Ninetta LightsHatcher who has seen her before and she has a f/u with on 3/20 to see what they would like to do.

## 2017-06-30 NOTE — Telephone Encounter (Signed)
Spoke to Dr. Ninetta LightsHatcher regarding patient's antibiotics.  Will change her to daptomycin 8 mg/kg daily. [facility] [place] [facility] change. Will continue through stop date of 4/8.  She has a f/u with Dr. Ninetta LightsHatcher on 3/20.  I told Larita FifeLynn we would let him know if her end date changes after appointment.

## 2017-06-30 NOTE — Telephone Encounter (Signed)
Patient's repeat SCr from 3/4 was 1.27. VT <4 now. SCr trending down, would you like to continue with vanc?

## 2017-07-09 ENCOUNTER — Other Ambulatory Visit: Payer: Self-pay | Admitting: Pharmacist

## 2017-07-11 ENCOUNTER — Telehealth: Payer: Self-pay | Admitting: Infectious Disease

## 2017-07-11 MED ORDER — DOXYCYCLINE HYCLATE 100 MG PO TABS
100.0000 mg | ORAL_TABLET | Freq: Two times a day (BID) | ORAL | 1 refills | Status: DC
Start: 2017-07-11 — End: 2017-07-15

## 2017-07-11 NOTE — Telephone Encounter (Signed)
Patient called. She is having shaking chills and back pain with every infusion of the daptomycin  She is worried this is an ADR but it could also mean that the PICC is infected and access it is making her transiently bacteremic.  I have asked her NOT to take tonights dose  I am sending doxy to [facility] [address] which she will take instead.  If she has symptoms despite being on doxy she will come to ED where  We should have SET of PERIPHERAL blood cultures taken and pt should be admitted and placed on Teflaro for her MRSA and perhaps empiric cipro for other GNR  If she does well overnight I would like her seen in the clinic so we can draw blood cultures, examine the line and likely remove it  We can then consider placing new line and going with IV teflaro

## 2017-07-12 ENCOUNTER — Emergency Department (HOSPITAL_COMMUNITY): Payer: BLUE CROSS/BLUE SHIELD

## 2017-07-12 ENCOUNTER — Telehealth: Payer: Self-pay

## 2017-07-12 ENCOUNTER — Inpatient Hospital Stay (HOSPITAL_COMMUNITY)
Admission: EM | Admit: 2017-07-12 | Discharge: 2017-07-15 | DRG: 314 | Disposition: A | Discharge status: Home / Self Care | Payer: BLUE CROSS/BLUE SHIELD | Attending: Internal Medicine | Admitting: Internal Medicine

## 2017-07-12 ENCOUNTER — Encounter: Payer: Self-pay | Admitting: Infectious Disease

## 2017-07-12 ENCOUNTER — Other Ambulatory Visit: Payer: Self-pay

## 2017-07-12 ENCOUNTER — Encounter (HOSPITAL_COMMUNITY): Payer: Self-pay

## 2017-07-12 ENCOUNTER — Ambulatory Visit (INDEPENDENT_AMBULATORY_CARE_PROVIDER_SITE_OTHER): Payer: BLUE CROSS/BLUE SHIELD | Admitting: Infectious Disease

## 2017-07-12 ENCOUNTER — Inpatient Hospital Stay (HOSPITAL_COMMUNITY): Admission: AD | Admit: 2017-07-12 | Payer: Self-pay | Source: Ambulatory Visit | Admitting: Internal Medicine

## 2017-07-12 VITALS — BP 128/81 | HR 106 | Temp 98.3°F | Wt 281.0 lb

## 2017-07-12 DIAGNOSIS — R0602 Shortness of breath: Secondary | ICD-10-CM | POA: Diagnosis present

## 2017-07-12 DIAGNOSIS — A4159 Other Gram-negative sepsis: Secondary | ICD-10-CM | POA: Diagnosis present

## 2017-07-12 DIAGNOSIS — G4733 Obstructive sleep apnea (adult) (pediatric): Secondary | ICD-10-CM | POA: Diagnosis present

## 2017-07-12 DIAGNOSIS — T827XXA Infection and inflammatory reaction due to other cardiac and vascular devices, implants and grafts, initial encounter: Secondary | ICD-10-CM | POA: Diagnosis not present

## 2017-07-12 DIAGNOSIS — R509 Fever, unspecified: Secondary | ICD-10-CM

## 2017-07-12 DIAGNOSIS — R0609 Other forms of dyspnea: Secondary | ICD-10-CM

## 2017-07-12 DIAGNOSIS — R7881 Bacteremia: Secondary | ICD-10-CM | POA: Diagnosis present

## 2017-07-12 DIAGNOSIS — M869 Osteomyelitis, unspecified: Secondary | ICD-10-CM | POA: Diagnosis present

## 2017-07-12 DIAGNOSIS — Z7984 Long term (current) use of oral hypoglycemic drugs: Secondary | ICD-10-CM

## 2017-07-12 DIAGNOSIS — T80211A Bloodstream infection due to central venous catheter, initial encounter: Principal | ICD-10-CM | POA: Diagnosis present

## 2017-07-12 DIAGNOSIS — N179 Acute kidney failure, unspecified: Secondary | ICD-10-CM | POA: Diagnosis present

## 2017-07-12 DIAGNOSIS — R6521 Severe sepsis with septic shock: Secondary | ICD-10-CM | POA: Diagnosis present

## 2017-07-12 DIAGNOSIS — L270 Generalized skin eruption due to drugs and medicaments taken internally: Secondary | ICD-10-CM | POA: Diagnosis not present

## 2017-07-12 DIAGNOSIS — L271 Localized skin eruption due to drugs and medicaments taken internally: Secondary | ICD-10-CM | POA: Diagnosis not present

## 2017-07-12 DIAGNOSIS — E1142 Type 2 diabetes mellitus with diabetic polyneuropathy: Secondary | ICD-10-CM | POA: Diagnosis present

## 2017-07-12 DIAGNOSIS — J189 Pneumonia, unspecified organism: Secondary | ICD-10-CM | POA: Diagnosis present

## 2017-07-12 DIAGNOSIS — Y848 Other medical procedures as the cause of abnormal reaction of the patient, or of later complication, without mention of misadventure at the time of the procedure: Secondary | ICD-10-CM | POA: Diagnosis present

## 2017-07-12 DIAGNOSIS — B9562 Methicillin resistant Staphylococcus aureus infection as the cause of diseases classified elsewhere: Secondary | ICD-10-CM | POA: Diagnosis present

## 2017-07-12 DIAGNOSIS — T368X5A Adverse effect of other systemic antibiotics, initial encounter: Secondary | ICD-10-CM | POA: Diagnosis present

## 2017-07-12 DIAGNOSIS — M546 Pain in thoracic spine: Secondary | ICD-10-CM | POA: Diagnosis not present

## 2017-07-12 DIAGNOSIS — Z89021 Acquired absence of right finger(s): Secondary | ICD-10-CM | POA: Diagnosis not present

## 2017-07-12 DIAGNOSIS — Y92230 Patient room in hospital as the place of occurrence of the external cause: Secondary | ICD-10-CM | POA: Diagnosis not present

## 2017-07-12 DIAGNOSIS — T7840XA Allergy, unspecified, initial encounter: Secondary | ICD-10-CM | POA: Diagnosis present

## 2017-07-12 DIAGNOSIS — T8579XA Infection and inflammatory reaction due to other internal prosthetic devices, implants and grafts, initial encounter: Secondary | ICD-10-CM

## 2017-07-12 DIAGNOSIS — I959 Hypotension, unspecified: Secondary | ICD-10-CM | POA: Diagnosis present

## 2017-07-12 DIAGNOSIS — Z981 Arthrodesis status: Secondary | ICD-10-CM

## 2017-07-12 DIAGNOSIS — R0902 Hypoxemia: Secondary | ICD-10-CM | POA: Diagnosis present

## 2017-07-12 DIAGNOSIS — D6959 Other secondary thrombocytopenia: Secondary | ICD-10-CM | POA: Diagnosis present

## 2017-07-12 DIAGNOSIS — A419 Sepsis, unspecified organism: Secondary | ICD-10-CM

## 2017-07-12 DIAGNOSIS — E1169 Type 2 diabetes mellitus with other specified complication: Secondary | ICD-10-CM | POA: Diagnosis present

## 2017-07-12 DIAGNOSIS — M86141 Other acute osteomyelitis, right hand: Secondary | ICD-10-CM | POA: Diagnosis not present

## 2017-07-12 DIAGNOSIS — M545 Low back pain, unspecified: Secondary | ICD-10-CM

## 2017-07-12 DIAGNOSIS — E11610 Type 2 diabetes mellitus with diabetic neuropathic arthropathy: Secondary | ICD-10-CM | POA: Diagnosis present

## 2017-07-12 DIAGNOSIS — E1165 Type 2 diabetes mellitus with hyperglycemia: Secondary | ICD-10-CM | POA: Diagnosis present

## 2017-07-12 DIAGNOSIS — D638 Anemia in other chronic diseases classified elsewhere: Secondary | ICD-10-CM | POA: Diagnosis present

## 2017-07-12 DIAGNOSIS — R05 Cough: Secondary | ICD-10-CM | POA: Diagnosis not present

## 2017-07-12 DIAGNOSIS — Z96653 Presence of artificial knee joint, bilateral: Secondary | ICD-10-CM | POA: Diagnosis present

## 2017-07-12 DIAGNOSIS — J180 Bronchopneumonia, unspecified organism: Secondary | ICD-10-CM

## 2017-07-12 DIAGNOSIS — T361X5A Adverse effect of cephalosporins and other beta-lactam antibiotics, initial encounter: Secondary | ICD-10-CM | POA: Diagnosis not present

## 2017-07-12 DIAGNOSIS — N390 Urinary tract infection, site not specified: Secondary | ICD-10-CM | POA: Diagnosis present

## 2017-07-12 DIAGNOSIS — Z823 Family history of stroke: Secondary | ICD-10-CM

## 2017-07-12 DIAGNOSIS — A4902 Methicillin resistant Staphylococcus aureus infection, unspecified site: Secondary | ICD-10-CM | POA: Diagnosis not present

## 2017-07-12 DIAGNOSIS — Z6841 Body Mass Index (BMI) 40.0 and over, adult: Secondary | ICD-10-CM

## 2017-07-12 DIAGNOSIS — R609 Edema, unspecified: Secondary | ICD-10-CM | POA: Diagnosis not present

## 2017-07-12 DIAGNOSIS — M549 Dorsalgia, unspecified: Secondary | ICD-10-CM | POA: Diagnosis present

## 2017-07-12 DIAGNOSIS — B961 Klebsiella pneumoniae [K. pneumoniae] as the cause of diseases classified elsewhere: Secondary | ICD-10-CM | POA: Diagnosis present

## 2017-07-12 DIAGNOSIS — E1140 Type 2 diabetes mellitus with diabetic neuropathy, unspecified: Secondary | ICD-10-CM | POA: Diagnosis not present

## 2017-07-12 DIAGNOSIS — E669 Obesity, unspecified: Secondary | ICD-10-CM | POA: Diagnosis not present

## 2017-07-12 DIAGNOSIS — Z8614 Personal history of Methicillin resistant Staphylococcus aureus infection: Secondary | ICD-10-CM | POA: Diagnosis not present

## 2017-07-12 DIAGNOSIS — R0781 Pleurodynia: Secondary | ICD-10-CM | POA: Diagnosis not present

## 2017-07-12 DIAGNOSIS — B999 Unspecified infectious disease: Secondary | ICD-10-CM | POA: Insufficient documentation

## 2017-07-12 DIAGNOSIS — Z881 Allergy status to other antibiotic agents status: Secondary | ICD-10-CM | POA: Diagnosis not present

## 2017-07-12 DIAGNOSIS — R0600 Dyspnea, unspecified: Secondary | ICD-10-CM

## 2017-07-12 HISTORY — DX: Dyspnea, unspecified: R06.00

## 2017-07-12 HISTORY — DX: Other forms of dyspnea: R06.09

## 2017-07-12 HISTORY — DX: Low back pain, unspecified: M54.50

## 2017-07-12 HISTORY — DX: Pleurodynia: R07.81

## 2017-07-12 HISTORY — DX: Fever, unspecified: R50.9

## 2017-07-12 LAB — CBC WITH DIFFERENTIAL/PLATELET
BASOS ABS: 0 10*3/uL (ref 0.0–0.1)
BASOS PCT: 0 %
BASOS PCT: 0 %
Basophils Absolute: 0 10*3/uL (ref 0.0–0.1)
EOS PCT: 1 %
Eosinophils Absolute: 0 10*3/uL (ref 0.0–0.7)
Eosinophils Absolute: 0.1 10*3/uL (ref 0.0–0.7)
Eosinophils Relative: 1 %
HCT: 25.6 % — ABNORMAL LOW (ref 36.0–46.0)
HEMATOCRIT: 26.3 % — AB (ref 36.0–46.0)
Hemoglobin: 8.1 g/dL — ABNORMAL LOW (ref 12.0–15.0)
Hemoglobin: 8.5 g/dL — ABNORMAL LOW (ref 12.0–15.0)
LYMPHS PCT: 26 %
Lymphocytes Relative: 27 %
Lymphs Abs: 1.1 10*3/uL (ref 0.7–4.0)
Lymphs Abs: 1.4 10*3/uL (ref 0.7–4.0)
MCH: 27.8 pg (ref 26.0–34.0)
MCH: 28.1 pg (ref 26.0–34.0)
MCHC: 31.6 g/dL (ref 30.0–36.0)
MCHC: 32.3 g/dL (ref 30.0–36.0)
MCV: 88 fL (ref 78.0–100.0)
MCV: 86.8 fL (ref 78.0–100.0)
MONO ABS: 0.4 10*3/uL (ref 0.1–1.0)
MONO ABS: 0.4 10*3/uL (ref 0.1–1.0)
MONOS PCT: 8 %
Monocytes Relative: 8 %
NEUTROS ABS: 2.8 10*3/uL (ref 1.7–7.7)
NEUTROS ABS: 3.4 10*3/uL (ref 1.7–7.7)
Neutrophils Relative %: 65 %
Neutrophils Relative %: 64 %
PLATELETS: 132 10*3/uL — AB (ref 150–400)
Platelets: 131 10*3/uL — ABNORMAL LOW (ref 150–400)
RBC: 2.91 MIL/uL — AB (ref 3.87–5.11)
RBC: 3.03 MIL/uL — ABNORMAL LOW (ref 3.87–5.11)
RDW: 15.4 % (ref 11.5–15.5)
RDW: 15.1 % (ref 11.5–15.5)
WBC: 4.4 10*3/uL (ref 4.0–10.5)
WBC: 5.3 10*3/uL (ref 4.0–10.5)

## 2017-07-12 LAB — COMPREHENSIVE METABOLIC PANEL
ALBUMIN: 2.8 g/dL — AB (ref 3.5–5.0)
ALBUMIN: 2.8 g/dL — AB (ref 3.5–5.0)
ALK PHOS: 96 U/L (ref 38–126)
ALT: 30 U/L (ref 14–54)
ALT: 30 U/L (ref 14–54)
ANION GAP: 12 (ref 5–15)
ANION GAP: 12 (ref 5–15)
AST: 29 U/L (ref 15–41)
AST: 30 U/L (ref 15–41)
Alkaline Phosphatase: 96 U/L (ref 38–126)
BUN: 11 mg/dL (ref 6–20)
BUN: 13 mg/dL (ref 6–20)
CALCIUM: 9.4 mg/dL (ref 8.9–10.3)
CHLORIDE: 100 mmol/L — AB (ref 101–111)
CO2: 24 mmol/L (ref 22–32)
CO2: 24 mmol/L (ref 22–32)
Calcium: 8.9 mg/dL (ref 8.9–10.3)
Chloride: 97 mmol/L — ABNORMAL LOW (ref 101–111)
Creatinine, Ser: 1.04 mg/dL — ABNORMAL HIGH (ref 0.44–1.00)
Creatinine, Ser: 1.05 mg/dL — ABNORMAL HIGH (ref 0.44–1.00)
GFR calc Af Amer: >60 mL/min (ref >60)
GFR calc Af Amer: >60 mL/min (ref >60)
GFR calc non Af Amer: >60 mL/min (ref >60)
GFR calc non Af Amer: >60 mL/min (ref >60)
GLUCOSE: 338 mg/dL — AB (ref 65–99)
GLUCOSE: 371 mg/dL — AB (ref 65–99)
POTASSIUM: 3.9 mmol/L (ref 3.5–5.1)
Potassium: 4.3 mmol/L (ref 3.5–5.1)
SODIUM: 136 mmol/L (ref 135–145)
SODIUM: 133 mmol/L — AB (ref 135–145)
Total Bilirubin: 0.5 mg/dL (ref 0.3–1.2)
Total Bilirubin: 0.6 mg/dL (ref 0.3–1.2)
Total Protein: 6 g/dL — ABNORMAL LOW (ref 6.5–8.1)
Total Protein: 6.4 g/dL — ABNORMAL LOW (ref 6.5–8.1)

## 2017-07-12 LAB — URINALYSIS, ROUTINE W REFLEX MICROSCOPIC
BACTERIA UA: NONE SEEN
Bilirubin Urine: NEGATIVE
Glucose, UA: >=500 mg/dL — AB
Ketones, ur: NEGATIVE mg/dL
Leukocytes, UA: NEGATIVE
NITRITE: NEGATIVE
Protein, ur: 30 mg/dL — AB
SPECIFIC GRAVITY, URINE: 1.023 (ref 1.005–1.030)
pH: 6 (ref 5.0–8.0)

## 2017-07-12 LAB — I-STAT CG4 LACTIC ACID, ED
LACTIC ACID, VENOUS: 2.46 mmol/L — AB (ref 0.5–1.9)
Lactic Acid, Venous: 2.95 mmol/L (ref 0.5–1.9)

## 2017-07-12 LAB — D-DIMER, QUANTITATIVE (NOT AT ARMC): D DIMER QUANT: 1.03 ug{FEU}/mL — AB (ref 0.00–0.50)

## 2017-07-12 LAB — D-DIMER, QUANTITATIVE: D-Dimer, Quant: 0.97 ug/mL-FEU — ABNORMAL HIGH (ref 0.00–0.50)

## 2017-07-12 LAB — BRAIN NATRIURETIC PEPTIDE: B NATRIURETIC PEPTIDE 5: 92.5 pg/mL (ref 0.0–100.0)

## 2017-07-12 MED ORDER — SODIUM CHLORIDE 0.9 % IV SOLN
100.0000 mg | Freq: Two times a day (BID) | INTRAVENOUS | Status: DC
  Administered 2017-07-12: 100 mg via INTRAVENOUS
  Filled 2017-07-12 (×2): qty 100

## 2017-07-12 MED ORDER — IOPAMIDOL (ISOVUE-370) INJECTION 76%
INTRAVENOUS | Status: AC
  Administered 2017-07-12: 100 mL
  Filled 2017-07-12: qty 100

## 2017-07-12 MED ORDER — OXYCODONE-ACETAMINOPHEN 5-325 MG PO TABS
1.0000 | ORAL_TABLET | Freq: Four times a day (QID) | ORAL | Status: DC | PRN
  Administered 2017-07-12: 1 via ORAL
  Filled 2017-07-12: qty 1

## 2017-07-12 MED ORDER — IOPAMIDOL (ISOVUE-370) INJECTION 76%
INTRAVENOUS | Status: AC
  Filled 2017-07-12: qty 100

## 2017-07-12 MED ORDER — ONDANSETRON HCL 4 MG/2ML IJ SOLN: 4.0000 mg | Freq: Four times a day (QID) | INTRAMUSCULAR | Status: DC | PRN

## 2017-07-12 MED ORDER — DULOXETINE HCL 60 MG PO CPEP
90.0000 mg | ORAL_CAPSULE | Freq: Every day | ORAL | Status: DC
  Filled 2017-07-12: qty 1

## 2017-07-12 MED ORDER — INSULIN ASPART 100 UNIT/ML ~~LOC~~ SOLN
0.0000 [IU] | Freq: Three times a day (TID) | SUBCUTANEOUS | Status: DC
  Administered 2017-07-13: 3 [IU] via SUBCUTANEOUS
  Administered 2017-07-13: 2 [IU] via SUBCUTANEOUS
  Administered 2017-07-13: 5 [IU] via SUBCUTANEOUS
  Administered 2017-07-14 – 2017-07-15 (×4): 2 [IU] via SUBCUTANEOUS
  Filled 2017-07-12 (×2): qty 1

## 2017-07-12 MED ORDER — LINEZOLID 600 MG/300ML IV SOLN
600.0000 mg | Freq: Two times a day (BID) | INTRAVENOUS | Status: DC
  Administered 2017-07-12 – 2017-07-13 (×2): 600 mg via INTRAVENOUS
  Filled 2017-07-12 (×2): qty 300

## 2017-07-12 MED ORDER — ACETAMINOPHEN 650 MG RE SUPP: 650.0000 mg | Freq: Four times a day (QID) | RECTAL | Status: DC | PRN

## 2017-07-12 MED ORDER — ADULT MULTIVITAMIN W/MINERALS CH
1.0000 | ORAL_TABLET | Freq: Every day | ORAL | Status: DC
  Administered 2017-07-13 – 2017-07-14 (×2): 1 via ORAL
  Filled 2017-07-12 (×3): qty 1

## 2017-07-12 MED ORDER — ACETAMINOPHEN 325 MG PO TABS
650.0000 mg | ORAL_TABLET | Freq: Once | ORAL | Status: AC
  Administered 2017-07-12: 650 mg via ORAL
  Filled 2017-07-12: qty 2

## 2017-07-12 MED ORDER — ONDANSETRON HCL 4 MG PO TABS: 4.0000 mg | ORAL_TABLET | Freq: Four times a day (QID) | ORAL | Status: DC | PRN

## 2017-07-12 MED ORDER — VITAMIN B-12 1000 MCG PO TABS
1000.0000 ug | ORAL_TABLET | Freq: Every day | ORAL | Status: DC
  Administered 2017-07-14 – 2017-07-15 (×2): 1000 ug via ORAL
  Filled 2017-07-12 (×3): qty 1

## 2017-07-12 MED ORDER — ACETAMINOPHEN 325 MG PO TABS: 650.0000 mg | ORAL_TABLET | Freq: Four times a day (QID) | ORAL | Status: DC | PRN

## 2017-07-12 MED ORDER — LACTATED RINGERS IV BOLUS (SEPSIS)
1000.0000 mL | Freq: Once | INTRAVENOUS | Status: AC
  Administered 2017-07-12: 1000 mL via INTRAVENOUS

## 2017-07-12 MED ORDER — SODIUM CHLORIDE 0.9 % IV SOLN
2.0000 g | Freq: Once | INTRAVENOUS | Status: AC
  Administered 2017-07-12: 2 g via INTRAVENOUS
  Filled 2017-07-12: qty 2

## 2017-07-12 MED ORDER — OXYCODONE-ACETAMINOPHEN 5-325 MG PO TABS
1.0000 | ORAL_TABLET | Freq: Four times a day (QID) | ORAL | Status: AC | PRN
  Administered 2017-07-13 – 2017-07-14 (×2): 1 via ORAL
  Filled 2017-07-12 (×2): qty 1

## 2017-07-12 MED ORDER — INSULIN GLARGINE 100 UNIT/ML ~~LOC~~ SOLN
10.0000 [IU] | Freq: Every day | SUBCUTANEOUS | Status: DC
  Administered 2017-07-13 – 2017-07-14 (×3): 10 [IU] via SUBCUTANEOUS
  Filled 2017-07-12 (×4): qty 0.1

## 2017-07-12 MED ORDER — MIRABEGRON ER 50 MG PO TB24
50.0000 mg | ORAL_TABLET | Freq: Every morning | ORAL | Status: DC
  Filled 2017-07-12: qty 1

## 2017-07-12 MED ORDER — ENOXAPARIN SODIUM 40 MG/0.4ML ~~LOC~~ SOLN
40.0000 mg | Freq: Every day | SUBCUTANEOUS | Status: DC
  Filled 2017-07-12 (×2): qty 0.4

## 2017-07-12 MED ORDER — SODIUM CHLORIDE 0.9 % IV SOLN
INTRAVENOUS | Status: AC
  Administered 2017-07-13 (×2): via INTRAVENOUS

## 2017-07-12 NOTE — Progress Notes (Signed)
[facility].   [facility] will follow pt and support HH needs upon DC to home.  If patient discharges after hours, please call [phone].   Sedalia Mutaamela S Chandler 07/12/2017, 3:19 PM

## 2017-07-12 NOTE — ED Triage Notes (Signed)
Pt endorses that her dr was directly admitting her for possible picc line infection. Pt was supposed to be admitted today but for the past 2 days she has had central back pain and shob. Pt being treated for mrsa. Told that she should come to the ER for PE rule out. Speaking in complete sentences.

## 2017-07-12 NOTE — ED Provider Notes (Signed)
[facility] EMERGENCY DEPARTMENT Provider Note   CSN: [id number] Arrival date & time: 07/12/17  1357     History   Chief Complaint Chief Complaint  Patient presents with  . Chest Pain  . Shortness of Breath    HPI [name] is a 52 y.o. female.  The history is provided by the patient.  Chest Pain   This is a new problem. The current episode started yesterday. The problem has been gradually worsening. The pain is associated with breathing. The pain is present in the substernal region. The pain is moderate. The quality of the pain is described as sharp. Duration of episode(s) is 2 days. Associated symptoms include back pain, cough and shortness of breath. Pertinent negatives include no abdominal pain, no exertional chest pressure, no fever, no headaches, no lower extremity edema, no nausea, no palpitations and no vomiting.  -Patient is a 53 year old female with history of diabetes, neuropathy, recent right hand osteomyelitis status post distal finger amputation and IV antibiotics who presents to the hospital with worsening pleuritic chest pain and shortness of breath.  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  She was sent home on vancomycin through a PICC line however she developed renal failure with this so that had to be stopped.  She was switched to IV daptomycin and was doing well on it until the last 4 days when she started having rigors and chills every time she administer daptomycin through the PICC line.  Her ID doctor told her to stop this and put her on oral doxycycline started yesterday and told her to not use the PICC line anymore.  She followed up with her ID doctor this morning who sent some blood tests, blood cultures, d-dimer for the pleuritic chest pain and the d-dimer came back positive.  Her ID doctor was arranging for a direct admit to the hospitalist and told her to rest at home and wait for a phone  call whenever a bed is available for her.  She was told to come to the hospital if she started feeling worse.  She did start feeling worse and having worsening shortness of breath so she came in.  Past Medical History:  Diagnosis Date  . Arthritis   . Diabetes mellitus without complication ([place])   . DOE (dyspnea on exertion) 07/12/2017  . FUO (fever of unknown origin) 07/12/2017  . Gastroenteritis 04/2016  . Headache(784.0)    hormonal headache  . Low back pain 07/12/2017  . Neuropathy    bilateral feet  . Obese   . Pleuritic chest pain 07/12/2017  . Pneumonia    hx 20+ years ago  . Sleep apnea    bipap 1 yr  . Ulcer of foot ([place])    toe ulcer greater than a year ago    Patient Active Problem List   Diagnosis Date Noted  . FUO (fever of unknown origin) 07/12/2017  . Low back pain 07/12/2017  . Pleuritic chest pain 07/12/2017  . DOE (dyspnea on exertion) 07/12/2017  . Sepsis ([place]) 07/12/2017  . Idiopathic peripheral neuropathy   . Osteomyelitis of right hand ([place])   . Osteomyelitis of finger of right hand ([place]) 06/08/2017  . MRSA infection   . Open wnd of finger 05/27/2017  . Lactic acidosis 05/05/2016  . Dehydration 05/05/2016  . Gastroenteritis 05/04/2016  . Controlled type 2 diabetes with neuropathy ([place]) 05/04/2016  . Charcot ankle 08/03/2014  . Prosthetic joint infection (  [place]) 01/31/2013  . Postoperative anemia due to acute blood loss 06/25/2011  . Osteoarthritis of knee 06/23/2011  . Obesity, morbid (more than 100 lbs over ideal weight or BMI > 40) ([place]) 06/23/2011    Past Surgical History:  Procedure Laterality Date  . AMPUTATION Right 06/11/2017   Procedure: RIGHT LONG FINGER AMPUTATION DIGIT;  Surgeon: Milly Jakob, MD;  Location: [facility];  Service: Orthopedics;  Laterality: Right;  . ANKLE FUSION Left 08/03/2014   Procedure: Left Tibiocalcaneal Fusion;  Surgeon: Newt Minion, MD;  Location: [facility];  Service: Orthopedics;  Laterality: Left;  . APPLICATION OF  WOUND VAC Left 08/03/2014   Procedure: APPLICATION OF WOUND VAC;  Surgeon: Newt Minion, MD;  Location: [facility];  Service: Orthopedics;  Laterality: Left;  . I&D KNEE WITH POLY EXCHANGE Right 01/31/2013   Procedure: IRRIGATION AND DEBRIDEMENT KNEE WITH POLY EXCHANGE possible Antibiotic Spacer;  Surgeon: Garald Balding, MD;  Location: [facility];  Service: Orthopedics;  Laterality: Right;  . INCISION AND DRAINAGE Right 01/31/2013   ANTIBIOTIC SPACER  RIGHT KNEE  DR Durward Fortes   . KNEE ARTHROSCOPY     left knee  . ORIF ANKLE FRACTURE Left 03/16/2014   Procedure: Foot Excision Charcot Collapse,  Internal Fixation;  Surgeon: Newt Minion, MD;  Location: [facility];  Service: Orthopedics;  Laterality: Left;  . REPLACEMENT TOTAL KNEE     left  . TOTAL KNEE ARTHROPLASTY  06/23/2011   Procedure: TOTAL KNEE ARTHROPLASTY;  Surgeon: Garald Balding, MD;  Location: [facility];  Service: Orthopedics;  Laterality: Right;  RIGHT TOTAL KNEE REPLACEMENT     OB History    No data available       Home Medications    Prior to Admission medications   Medication Sig Start Date End Date Taking? Authorizing Provider  DAPTOmycin in sodium chloride 0.9 % 100 mL Inject 975 mg into the vein daily.   Yes [provider]  doxycycline (VIBRA-TABS) 100 MG tablet Take 1 tablet (100 mg total) by mouth 2 (two) times daily. 07/11/17  Yes Tommy Medal, Lavell Islam, MD  HYDROcodone-acetaminophen [facility]) 10-325 MG tablet Take 1 tablet by mouth every 6 (six) hours as needed for moderate pain.  07/02/16  Yes [provider]  mupirocin ointment (BACTROBAN) 2 % Apply 1 application to the affected fingertip on left hand one to three times a day   Yes [provider]  ondansetron (ZOFRAN-ODT) 8 MG disintegrating tablet Take 8 mg by mouth every 8 (eight) hours as needed for nausea/vomiting. DISSOLVE IN THE MOUTH 04/30/16  Yes [provider]  oxyCODONE (OXY IR/ROXICODONE) 5 MG immediate release tablet Take 1-2 tablets  (5-10 mg total) by mouth every 6 (six) hours as needed for severe pain or breakthrough pain. 06/12/17  Yes Milly Jakob, MD  Cyanocobalamin (B-12) 1000 MCG TABS Take 1,000 mcg by mouth daily.    [provider]  diclofenac (VOLTAREN) 75 MG EC tablet Take 75 mg by mouth 2 (two) times daily.  02/28/14   [provider]  DULoxetine (CYMBALTA) 30 MG capsule Take 90 mg by mouth daily.     [provider]  Exenatide ER 2 MG PEN Inject 2 mg into the skin every Tuesday.  02/06/16   [provider]  glipiZIDE (GLUCOTROL XL) 10 MG 24 hr tablet Take 10 mg by mouth daily with breakfast.  05/23/17   [provider]  glucosamine-chondroitin 500-400 MG tablet Take 2 tablets by mouth daily.  [provider]  mirabegron ER (MYRBETRIQ) 50 MG TB24 tablet Take 50 mg by mouth every morning. 01/13/16   [provider]  Multiple Vitamin (MULTIVITAMIN WITH MINERALS) TABS Take 1 tablet by mouth daily.    [provider]  SitaGLIPtin-MetFORMIN HCl (JANUMET XR) 50-1000 MG TB24 Take 1 tablet by mouth at bedtime. 11/21/15   [provider]  vancomycin IVPB Inject 1,750 mg into the vein every 12 (twelve) hours. Indication:  Osteomyelitis Last Day of Therapy:  08/02/17 Labs - Sunday/Monday:  CBC/D, BMP, and vancomycin trough. Labs - Thursday:  BMP and vancomycin trough Labs - Every other week:  ESR and CRP Patient not taking: Reported on 07/12/2017 06/12/17 08/02/17  Bufford Lope, DO  VITAMIN A PO Take 5,000 mg by mouth daily.     [provider]    Family History Family History  Problem Relation Age of Onset  . Anesthesia problems Father   . Stroke Father   . Hypotension Neg Hx   . Malignant hyperthermia Neg Hx   . Pseudochol deficiency Neg Hx     Social History Social History   Tobacco Use  . Smoking status: Never Smoker  . Smokeless tobacco: Never Used  Substance Use Topics  . Alcohol use: Yes    Alcohol/week: 0.0 oz     Comment: occ  . Drug use: No     Allergies   Daptomycin and Vancomycin   Review of Systems Review of Systems  Constitutional: Positive for chills and fatigue. Negative for fever.  HENT: Negative for ear pain and sore throat.   Eyes: Negative for pain and visual disturbance.  Respiratory: Positive for cough and shortness of breath.   Cardiovascular: Positive for chest pain. Negative for palpitations and leg swelling.  Gastrointestinal: Negative for abdominal pain, nausea and vomiting.  Genitourinary: Negative for dysuria.  Musculoskeletal: Positive for back pain. Negative for arthralgias and neck pain.  Skin: Negative for rash.  Neurological: Negative for syncope and headaches.  All other systems reviewed and are negative.    Physical Exam Updated Vital Signs BP (!) 118/45   Pulse (!) 121   Temp (!) 103.1 F (39.5 C) (Oral)   Resp (!) 30   Ht _0  (1.626 m)   Wt 127.5 kg (281 lb)   SpO2 94%   BMI 48.23 kg/m   Physical Exam  Constitutional: She appears well-developed and well-nourished. No distress.  HENT:  Head: Normocephalic and atraumatic.  Eyes: Conjunctivae are normal.  Neck: Neck supple.  Cardiovascular: Normal rate and regular rhythm.  No murmur heard. Pulmonary/Chest: Effort normal and breath sounds normal. No respiratory distress. She has no decreased breath sounds. She has no wheezes. She has no rhonchi. She has no rales.  Abdominal: Soft. There is no tenderness.  Musculoskeletal: She exhibits no edema.       Arms:      Right lower leg: She exhibits no edema.       Left lower leg: She exhibits no edema.  Neurological: She is alert.  Skin: Skin is warm and dry.  Psychiatric: She has a normal mood and affect.  Nursing note and vitals reviewed.    ED Treatments / Results  Labs (all labs ordered are listed, but only abnormal results are displayed) Labs Reviewed  COMPREHENSIVE METABOLIC PANEL - Abnormal; Notable for the following components:       Result Value   Sodium 133 (*)    Chloride 97 (*)    Glucose, Bld 371 (*)  Creatinine, Ser 1.05 (*)    Total Protein 6.4 (*)    Albumin 2.8 (*)    All other components within normal limits  CBC WITH DIFFERENTIAL/PLATELET - Abnormal; Notable for the following components:   RBC 3.03 (*)    Hemoglobin 8.5 (*)    HCT 26.3 (*)    Platelets 131 (*)    All other components within normal limits  URINALYSIS, ROUTINE W REFLEX MICROSCOPIC - Abnormal; Notable for the following components:   Glucose, UA >=500 (*)    Hgb urine dipstick SMALL (*)    Protein, ur 30 (*)    Squamous Epithelial / LPF 0-5 (*)    All other components within normal limits  D-DIMER, QUANTITATIVE (NOT AT [facility]) - Abnormal; Notable for the following components:   D-Dimer, Quant 1.03 (*)    All other components within normal limits  I-STAT CG4 LACTIC ACID, ED - Abnormal; Notable for the following components:   Lactic Acid, Venous 2.95 (*)    All other components within normal limits  URINE CULTURE  CULTURE, BLOOD (SINGLE)  BRAIN NATRIURETIC PEPTIDE  I-STAT BETA HCG BLOOD, ED ([facility], [facility], [facility] ONLY)  I-STAT TROPONIN, ED  I-STAT CG4 LACTIC ACID, ED  I-STAT CG4 LACTIC ACID, ED    EKG  EKG Interpretation  Date/Time:  Monday July 12 2017 14:04:15 EDT Ventricular Rate:  93 PR Interval:  172 QRS Duration: 82 QT Interval:  342 QTC Calculation: 425 R Axis:   100 Text Interpretation:  Normal sinus rhythm Rightward axis Borderline ECG since last tracing no significant change Confirmed by Noemi Chapel [id number]) on 07/12/2017 4:59:24 PM       Radiology Dg Chest 2 View  Result Date: 07/12/2017 CLINICAL DATA:  Shortness of breath for 2 days, mid and right chest pain. Chills. PICC line placed 8 weeks ago to treat MRSA. EXAM: CHEST - 2 VIEW COMPARISON:  Chest x-rays dated 03/23/2014 and 01/30/2013. FINDINGS: New diffuse reticulonodular opacities throughout both lungs. No pleural effusion or pneumothorax seen. Mild cardiomegaly is  stable. Overall cardiomediastinal silhouette is stable. Left-sided PICC line appears appropriately positioned with tip at the level of the mid/lower SVC. No acute or suspicious osseous finding. IMPRESSION: 1. New diffuse reticulonodular opacities throughout both lungs. Differential includes pulmonary edema related to CHF/volume overload and atypical pneumonia. Clinical history raises the additional possibility of septic emboli. Would consider chest CT for further characterization. 2. Left-sided PICC line in place with tip appropriately positioned at the level of the mid/lower SVC. 3. Stable mild cardiomegaly. Electronically Signed   By: Franki Cabot M.D.   On: 07/12/2017 15:15   Ct Angio Chest Pe W And/or Wo Contrast  Result Date: 07/12/2017 CLINICAL DATA:  Chest pain with exertion. Shortness of breath over the last day. Positive D-dimer. PICC line placement for treatment of staph infection. EXAM: CT ANGIOGRAPHY CHEST WITH CONTRAST TECHNIQUE: Multidetector CT imaging of the chest was performed using the standard protocol during bolus administration of intravenous contrast. Multiplanar CT image reconstructions and MIPs were obtained to evaluate the vascular anatomy. CONTRAST:  100 cc Isovue 370 COMPARISON:  Chest radiography same day. FINDINGS: Cardiovascular: Pulmonary arterial opacification is good. No pulmonary emboli. Heart size is normal. There is mild aortic atherosclerosis and coronary artery calcification. No pericardial fluid. Mediastinum/Nodes: No mass or lymphadenopathy. Lungs/Pleura: No pleural effusion. Widespread patchy bilateral airspace density, upper lobe predominant, worse on the right than the left, most consistent with bronchopneumonia. The differential diagnosis would include acute allergic alveolitis and sepsis/ARDS. Acute  edema or acute pulmonary hemorrhage would be less likely given this pattern. Upper Abdomen: Negative Musculoskeletal: Ordinary mild degenerative changes. Review of the  MIP images confirms the above findings. IMPRESSION: No pulmonary emboli. Widespread bilateral airspace filling pattern most consistent with widespread bronchopneumonia. Findings could be seen in the setting of sepsis/ARDS or acute allergic alveolitis. Aortic Atherosclerosis (ICD10-I70.0). Electronically Signed   By: Nelson Chimes M.D.   On: 07/12/2017 20:06   Ct Lumbar Spine W Contrast  Result Date: 07/12/2017 CLINICAL DATA:  Possible sepsis.  Back pain. EXAM: CT LUMBAR SPINE WITH CONTRAST TECHNIQUE: Multidetector CT imaging of the lumbar spine was performed with intravenous contrast administration. CONTRAST:  100 cc Isovue 370 COMPARISON:  05/04/2016 FINDINGS: Segmentation: 5 lumbar type vertebral bodies. Alignment: 3 mm anterolisthesis L4-5.  2 mm anterolisthesis L5-S1. Vertebrae: No sign of fracture. No finding to suggest the presence of osteomyelitis. No other sign of spinal infection. Paraspinal and other soft tissues: Negative Disc levels: Ordinary degenerative changes at L2-3 and above. L3-4: Disc bulge.  Facet hypertrophy.  Mild lateral recess stenosis. L4-5: Advanced bilateral facet arthropathy with 3 mm anterolisthesis. Circumferential bulging of the disc. Severe multifactorial spinal stenosis that could be symptomatic. L5-S1: Advanced bilateral facet arthropathy with 2 mm anterolisthesis. Circumferential bulging of the disc. Stenosis of the lateral recesses and foramina that could be symptomatic. Bilateral sacroiliac osteoarthritis. IMPRESSION: No sign of spinal infection in the lumbar region. Multifactorial spinal stenosis at L4-5 and L5-S1 that could be a cause of back pain or neural compressive symptoms. Electronically Signed   By: Nelson Chimes M.D.   On: 07/12/2017 20:28   Ct T-spine No Charge  Result Date: 07/12/2017 CLINICAL DATA:  Mid back pain EXAM: CT THORACIC SPINE WITHOUT CONTRAST TECHNIQUE: Multidetector CT images of the thoracic were obtained using the standard protocol without  intravenous contrast. COMPARISON:  Chest CT 07/12/2017 FINDINGS: Alignment: Normal. Vertebrae: There is mild multilevel thoracic vertebral bite a generative height loss with anterior osteophyte formation. No fracture. Mild multilevel disc space narrowing. Paraspinal and other soft tissues: Extensive airspace opacities within both lungs, better characterized on concomitant chest CT. Disc levels: Disc osteophyte complex at T6-T7 mildly narrows the spinal canal. No other spinal canal stenosis. Neural foramina are clearly patent. IMPRESSION: 1. No acute abnormality of the thoracic spine. 2. Mild narrowing of the spinal canal by a central disc osteophyte complex at T6-T7. 3. Confluent airspace disease, better characterized on concomitant chest CT. Please see the dedicated report for that study. Electronically Signed   By: Ulyses Jarred M.D.   On: 07/12/2017 20:42    Procedures Procedures (including critical care time)  Medications Ordered in ED Medications  oxyCODONE-acetaminophen (PERCOCET/ROXICET) 5-325 MG per tablet 1 tablet (1 tablet Oral Given 07/12/17 1951)  lactated ringers bolus 1,000 mL (1,000 mLs Intravenous New Bag/Given 07/12/17 2219)  linezolid (ZYVOX) IVPB 600 mg (not administered)  ceFEPIme (MAXIPIME) 1 g in sodium chloride 0.9 % 100 mL IVPB (not administered)  acetaminophen (TYLENOL) tablet 650 mg (not administered)  lactated ringers bolus 1,000 mL (0 mLs Intravenous Stopped 07/12/17 1849)  iopamidol (ISOVUE-370) 76 % injection (100 mLs  Contrast Given 07/12/17 1904)     Initial Impression / Assessment and Plan / ED Course  I have reviewed the triage vital signs and the nursing notes.  Pertinent labs & imaging results that were available during my care of the patient were reviewed by me and considered in my medical decision making (see chart for details).  Patient is a 52 year old female with history as above significant for recent MRSA osteomyelitis accompanied by renal failure  secondary to IV vancomycin through PICC line, she was switched to IV daptomycin but over the last 4 days has had rigors and chills with use of the PICC line.  She was put on oral doxycycline last 2 days.  Patient saw her ID doc this morning who ordered blood cultures and a d-dimer.  Her d-dimer was positive.  She has been arranged for a direct admission and was told to wait at home for phone call, however was told to come in to the ED if she started to feel worse with chest pain and shortness of breath.  Her chest pain is pleuritic in nature.  She denies any current fevers or chills.  On exam she is in no acute distress.  Lung sounds are clear and heart sounds are normal without any obvious murmur.  Abdominal exam is benign.  No obvious extremity asymmetrical swelling.  PICC line appears clean without any erythema.    Workup as above significant for a positive d-dimer of 1.03.  Her initial lactate was 2.95.  No leukocytosis at this time.  BMP grossly unremarkable.  Hemoglobin about 1.5 g down from baseline.  Chest x-ray showed diffuse infiltrates with a differential of atypical pulmonary infection, pulmonary edema, septic emboli.  Given the positive d-dimer, left upper extremity ultrasound ordered as well as a CT PET scan.  This will further investigate these diffuse infiltrates.  Given her ID doctor placed her on doxycycline, will continue with IV doxycycline for now.  A blood culture sent off of her PICC line as well given she was having rigors and chills every time she is in the last 4 days concerning for line bacteremia.  Once the CTA PE scan is done, plan for admission for further IV antibiotics.  CT thoracic and lumbar spine noted on given complaints of worsening back pain to evaluate for obvious osteomyelitis while she is in the CT scanner.  CTA was negative for any PE but did show signs of bronchopneumonia.  Patient is getting IV doxycycline.    Of note while in the CT scan there after  receiving the IV contrast through her PICC line she started developing chills and became tachycardic.  Concern for line sepsis.  She was also receiving her doxycycline to that line as well.  Nurse instructed to obtain new IV and to remove the PICC line.  The PICC line is likely a source of infection for her currently.  Percocet was given for her back pain.  Patient to be admitted for IV antibiotics for treatment of her line sepsis and bronchopneumonia as well as further workup for possible spreading osteomyelitis.Marland Kitchen  She will likely need ID consult and may need broadening out of her antibiotics from doxycycline.  While in the ED, patient spiked a fever of 103.1 and remains tachycardic in the 120s.  Patient receiving her second liter of fluids.  Will trend lactates.  Antibiotic sponge to Zyvox and cefepime for H CAP.  Patient will be admitted to hospitalist for sepsis secondary to bronchopneumonia as well as further workup for her osteomyelitis.  Her PICC line is now removed.  Cultures have already been sent.  Giving Tylenol for fever.    Final Clinical Impressions(s) / ED Diagnoses   Final diagnoses:  Line sepsis, initial encounter ([place])  Bronchopneumonia  Osteomyelitis, unspecified site, unspecified type [facility])    ED Discharge Orders  None       Tobie Poet, DO 07/12/17 2227    Noemi Chapel, MD 07/14/17 [phone]

## 2017-07-12 NOTE — Telephone Encounter (Signed)
Per Dr. Daiva EvesVan Dam I called [name] [facility] bed control to get pt admitted to the hospital.  Admitting MD is Dr. Ophelia CharterYates Type of bed: Tele bed w/ mrssa contact precautions  Reason for Admission: Pleuritic chest pain Special Needs: contract precautions  Gave bed control the pts number they stated they did not have any available clean beds, but would contact pt once they had one available.  Pt stated she would go home and wait for a call once a bed was available.  I stressed to the pt that if she did not fell well before bed control called her that she would have to either go to the ED directly or call 911. Pt was understanding and insured she would do that. [name], [place]

## 2017-07-12 NOTE — ED Notes (Signed)
Doxycycline not started yet. Patient to be seen in CT first.

## 2017-07-12 NOTE — ED Notes (Signed)
I Stat Lactic Acid results of 3.28 reported to Dr Hyacinth MeekerMiller

## 2017-07-12 NOTE — Progress Notes (Signed)
  Patient awaiting admission. Refused BIPAP at this time but would like to wear BIPAP HS for OSA. Requests RT check with her tomorrow.

## 2017-07-12 NOTE — Addendum Note (Signed)
Addended by: Mariea ClontsGREEN, Alaja Goldinger D on: 07/12/2017 11:33 AM   Modules accepted: Orders

## 2017-07-12 NOTE — ED Notes (Signed)
Spoke with pharmacy. Unable to run Doxycycline with LR. Will complete LR bolus, and then run antibiotic.

## 2017-07-12 NOTE — ED Notes (Signed)
Main lab to add on BNP 

## 2017-07-12 NOTE — ED Notes (Signed)
Patient transported to CT 

## 2017-07-12 NOTE — ED Provider Notes (Addendum)
I saw and evaluated the patient, reviewed the resident's note and I agree with the findings and plan.  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   Pertinent Exam findings: On exam the patient has no tachycardia, no edema, her left upper extremity is soft, supple, no redness or tenderness of the left upper extremity, no swelling, no purulence or redness or warmth or tenderness around the PICC line.  Her abdomen is soft nontender, she is not tachycardic, her lungs are clear without any increased work of breathing, oxygen saturation is in the low 90 to 95% range.  She does not appear to have an increased work of breathing   The patient was sent from the clinic for further evaluation, at this time I think an ultrasound of the arm as well as a CT angiogram of the chest to rule out pulmonary embolism would be important.  She definitely has an abnormal chest x-ray with a reticular nodular pattern.  No hypotension, tachycardia afte rgetting IV abx - concern for line sepsis, not in shock, admitted  I personally interpreted the EKG as well as the resident and agree with the interpretation on the resident's chart.   EKG Interpretation  Date/Time:  Monday July 12 2017 14:04:15 EDT Ventricular Rate:  93 PR Interval:  172 QRS Duration: 82 QT Interval:  342 QTC Calculation: 425 R Axis:   100 Text Interpretation:  Normal sinus rhythm Rightward axis Borderline ECG since last tracing no significant change Confirmed by Eber HongMiller, Bentlee Drier ([id number]) on 07/12/2017 4:59:24 PM       .Critical Care Performed by: Eber HongMiller, Leshawn Houseworth, MD Authorized by: Eber HongMiller, Koston Hennes, MD   Critical care provider statement:    Critical care time (minutes):  35   Critical care time was exclusive of:  Separately billable procedures and treating other patients and teaching time   Critical care was necessary to treat or prevent imminent or life-threatening deterioration of the following conditions:  Sepsis   Critical care was time spent personally by me on the following activities:  Blood draw for specimens, development of treatment plan with patient or surrogate, discussions with consultants, evaluation of patient's response to treatment, examination of patient, obtaining history from patient or surrogate, ordering and performing treatments and interventions, ordering and review of laboratory studies, ordering and review of radiographic studies, pulse oximetry, re-evaluation of patient's condition and review of old charts     Final diagnoses:  Line sepsis, initial encounter (HCC)  Bronchopneumonia  Osteomyelitis, unspecified site, unspecified type (HCC)      Eber HongMiller, Mizani Dilday, MD 07/14/17 1036    Eber HongMiller, Mouhamad Teed, MD 07/30/17 1045

## 2017-07-12 NOTE — ED Notes (Signed)
Cup of water provided to patient for fluid challenge.  

## 2017-07-12 NOTE — Progress Notes (Addendum)
Subjective:   Chief complaints: fever, chills, and lower back pain with infusions of daptomycin, continued malaise and now pleuritic type chest pain and DOE   Patient ID: [name], female    DOB: Sep 22, 1965, 52 y.o.   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   She was DC on IV vancomycin but then developed nephrotoxicity and was changed to IV daptomycin. However after initially tolerating this she began to notice high fevers, and chills along with severe back pain with the daptomycin infusions and she had me paged yesterday.  I was concerned for either ADR from dapto vs PICC line infection where infusion of abx was potentially causing transient bacteremia.  I had her hold her IV dapto and called in doxy which she has been taking. We arranged for her to to come to clinic urgently  She has not had further fevers or chills but she feels poorly and is exhausted and with malaise. Back pain is not as bad but is still present.  On top of that she has noticed pleuritic type chest pain " I CANNOT take a deep breath," and DOE in simply walking from parking lot to our clinic.     Past Medical History:  Diagnosis Date  . Arthritis   . Diabetes mellitus without complication ([place])   . DOE (dyspnea on exertion) 07/12/2017  . FUO (fever of unknown origin) 07/12/2017  . Gastroenteritis 04/2016  . Headache(784.0)    hormonal headache  . Low back pain 07/12/2017  . Neuropathy    bilateral feet  . Obese   . Pleuritic chest pain 07/12/2017  . Pneumonia    hx 20+ years ago  . Sleep apnea    bipap 1 yr  . Ulcer of foot ([place])    toe ulcer greater than a year ago    Past Surgical History:  Procedure Laterality Date  . AMPUTATION Right 06/11/2017   Procedure: RIGHT LONG FINGER AMPUTATION DIGIT;  Surgeon: Milly Jakob, MD;  Location: [place];  Service:  Orthopedics;  Laterality: Right;  . ANKLE FUSION Left 08/03/2014   Procedure: Left Tibiocalcaneal Fusion;  Surgeon: Newt Minion, MD;  Location: [place];  Service: Orthopedics;  Laterality: Left;  . APPLICATION OF WOUND VAC Left 08/03/2014   Procedure: APPLICATION OF WOUND VAC;  Surgeon: Newt Minion, MD;  Location: [place];  Service: Orthopedics;  Laterality: Left;  . I&D KNEE WITH POLY EXCHANGE Right 01/31/2013   Procedure: IRRIGATION AND DEBRIDEMENT KNEE WITH POLY EXCHANGE possible Antibiotic Spacer;  Surgeon: Garald Balding, MD;  Location: [facility];  Service: Orthopedics;  Laterality: Right;  . INCISION AND DRAINAGE Right 01/31/2013   ANTIBIOTIC SPACER  RIGHT KNEE  DR Durward Fortes   . KNEE ARTHROSCOPY     left knee  . ORIF ANKLE FRACTURE Left 03/16/2014   Procedure: Foot Excision Charcot Collapse,  Internal Fixation;  Surgeon: Newt Minion, MD;  Location: [place];  Service: Orthopedics;  Laterality: Left;  . REPLACEMENT TOTAL KNEE     left  . TOTAL KNEE ARTHROPLASTY  06/23/2011   Procedure: TOTAL KNEE ARTHROPLASTY;  Surgeon: Garald Balding, MD;  Location: [place];  Service: Orthopedics;  Laterality: Right;  RIGHT TOTAL KNEE REPLACEMENT     Family History  Problem Relation Age of Onset  . Anesthesia problems Father   . Stroke Father   .  Hypotension Neg Hx   . Malignant hyperthermia Neg Hx   . Pseudochol deficiency Neg Hx       Social History   Socioeconomic History  . Marital status: Married    Spouse name: None  . Number of children: None  . Years of education: None  . Highest education level: None  Social Needs  . Financial resource strain: None  . Food insecurity - worry: None  . Food insecurity - inability: None  . Transportation needs - medical: None  . Transportation needs - non-medical: None  Occupational History  . None  Tobacco Use  . Smoking status: Never Smoker  . Smokeless tobacco: Never Used  Substance and Sexual Activity  . Alcohol use: Yes    Alcohol/week: 0.0  oz    Comment: occ  . Drug use: No  . Sexual activity: None  Other Topics Concern  . None  Social History Narrative  . None    No Known Allergies   Current Outpatient Medications:  .  doxycycline (VIBRA-TABS) 100 MG tablet, Take 1 tablet (100 mg total) by mouth 2 (two) times daily., Disp: 60 tablet, Rfl: 1 .  Cyanocobalamin (B-12) 1000 MCG TABS, Take 1,000 mcg by mouth daily., Disp: , Rfl:  .  diclofenac (VOLTAREN) 75 MG EC tablet, Take 75 mg by mouth 2 (two) times daily. , Disp: , Rfl: 9 .  DULoxetine (CYMBALTA) 60 MG capsule, Take 60 mg by mouth daily., Disp: , Rfl:  .  Exenatide ER 2 MG PEN, Inject 2 mg into the skin every Tuesday. , Disp: , Rfl:  .  glipiZIDE (GLUCOTROL XL) 10 MG 24 hr tablet, Take 10 mg by mouth daily with breakfast. , Disp: , Rfl: 5 .  glucosamine-chondroitin 500-400 MG tablet, Take 2 tablets by mouth daily. , Disp: , Rfl:  .  HYDROcodone-acetaminophen (NORCO) 10-325 MG tablet, Take 1 tablet by mouth every 6 (six) hours as needed for moderate pain. , Disp: , Rfl:  .  mirabegron ER (MYRBETRIQ) 50 MG TB24 tablet, Take 50 mg by mouth every morning., Disp: , Rfl:  .  Multiple Vitamin (MULTIVITAMIN WITH MINERALS) TABS, Take 1 tablet by mouth daily., Disp: , Rfl:  .  ondansetron (ZOFRAN-ODT) 8 MG disintegrating tablet, Take 8 mg by mouth every 8 (eight) hours as needed for nausea/vomiting. DISSOLVE IN THE MOUTH, Disp: , Rfl:  .  oxyCODONE (OXY IR/ROXICODONE) 5 MG immediate release tablet, Take 1-2 tablets (5-10 mg total) by mouth every 6 (six) hours as needed for severe pain or breakthrough pain. (Patient not taking: Reported on 07/12/2017), Disp: 30 tablet, Rfl: 0 .  SitaGLIPtin-MetFORMIN HCl (JANUMET XR) 50-1000 MG TB24, Take 1 tablet by mouth at bedtime., Disp: , Rfl:  .  vancomycin IVPB, Inject 1,750 mg into the vein every 12 (twelve) hours. Indication:  Osteomyelitis Last Day of Therapy:  08/02/17 Labs - Sunday/Monday:  CBC/D, BMP, and vancomycin trough. Labs - Thursday:   BMP and vancomycin trough Labs - Every other week:  ESR and CRP (Patient not taking: Reported on 07/12/2017), Disp: 102 Units, Rfl: 0 .  VITAMIN A PO, Take 5,000 mg by mouth daily. , Disp: , Rfl:    Review of Systems  Constitutional: Positive for activity change, appetite change, chills, diaphoresis, fatigue and fever.  HENT: Negative for facial swelling, mouth sores, nosebleeds, postnasal drip, sinus pain, sneezing, sore throat and tinnitus.   Respiratory: Positive for shortness of breath. Negative for cough.   Cardiovascular: Positive for chest pain and  palpitations. Negative for leg swelling.  Gastrointestinal: Negative for abdominal distention, abdominal pain, anal bleeding, constipation, diarrhea and nausea.  Genitourinary: Negative for dysuria and flank pain.  Musculoskeletal: Positive for arthralgias, back pain and myalgias.  Skin: Negative for color change, pallor, rash and wound.  Neurological: Positive for weakness. Negative for dizziness, seizures, syncope, facial asymmetry, light-headedness, numbness and headaches.  Psychiatric/Behavioral: Negative for agitation, behavioral problems, confusion, decreased concentration, dysphoric mood, hallucinations, self-injury and sleep disturbance. The patient is nervous/anxious. The patient is not hyperactive.        Objective:   Physical Exam  Constitutional: She is oriented to person, place, and time. She appears well-developed and well-nourished. No distress.  HENT:  Head: Normocephalic and atraumatic.  Mouth/Throat: No oropharyngeal exudate.  Eyes: Conjunctivae and EOM are normal. No scleral icterus.  Neck: Normal range of motion. Neck supple.  Cardiovascular: Regular rhythm. Tachycardia present.  Pulmonary/Chest: Effort normal and breath sounds normal. No respiratory distress. She has no wheezes.  Abdominal: She exhibits no distension. There is no tenderness. There is no rebound and no guarding.  Musculoskeletal: She exhibits no  edema or tenderness.  Neurological: She is alert and oriented to person, place, and time. She exhibits normal muscle tone. Coordination normal.  Skin: Skin is warm and dry. No rash noted. She is not diaphoretic. No erythema. No pallor.  Psychiatric: Her behavior is normal. Judgment and thought content normal. Her mood appears anxious.  Nursing note and vitals reviewed.  PICC line is not overtly infected see picture 07/12/17:    Finger is healing well           Assessment & Plan:   Pleuritic type chest pain: I will check D dimer. Given multiple problems that need urgent attention I am calling [facility] to try to arrange for direct admission. I think  Even if D dimer is negative she should get a CT angiogram to rule out PE. I would also recommend doppleri left UE   Fevers, chills and lower back pain with infusions: I am ordering a set of blood cultures. If back pain persists may want to get MRI l spine  I would not rechallenge with dapto at present but one could try to give her IV teflaro through the line and see if any symptoms return. If they do then pICC has to come out   MRSA infection: she has been covered with vanco, then dapto and now doxy orally for now  I spent greater than 40 minutes with the patient including greater than 50% of time in face to face counsel of the patient re workup for pleuritic type chest pain, malaise, fevers, possible PICC infection, her osteomyelitis, and in coordination of her care with [facility].   ADDENDUM: We obtained EKG which shows Sinus tachycardia. T wave inversion in I, a Q in lead II (not Q in February)  With T wave inversion (in February was upright) T w flat in III.   I discussed case with Dr. Lorin Mercy who agreed to direct admission to [facility] with telemetry bed. I DID NOT get troponins which should be obtained. I also agree with giving her a dose of lovenox while we await CTA and further workup.

## 2017-07-12 NOTE — H&P (Signed)
History and Physical    [name] ATF:[id number] DOB: July 02, 1965 DOA: 07/12/2017  PCP: Chesley Noon, MD  Patient coming from: Home.  Chief Complaint: Fever chills.  HPI: [name] is a 52 y.o. 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  Patient eventually developed renal failure from vancomycin and was switched to daptomycin.  Patient states she did fine on daptomycin for the first week but since the second week patient started developing fever chills whenever she takes a daptomycin.  She had called her infectious disease consultant Dr. Drucilla Schmidt 1 day ago and was advised to stop daptomycin infusion and doxycycline was called and was advised to come to the clinic the following day that was today.  In the office patient had blood cultures drawn. In addition patient also was complaining of pleuritic chest pain over the last 24 hours and has been having productive cough for last 2 days and EKG was done in the office which showed some T wave changes and was advised to come to the hospital for admission.  For the last 2 days patient also has been increasing low back pain.  ED Course: In the ER patient was tachycardic and had a fever of 103 F with hypotension which improved with fluids.  Since there was concern for pulmonary embolism with pleuritic type of chest pain CT angiogram of the chest was done which shows bilateral bronchopneumonia.  Blood cultures were again obtained and started on Zyvox and cefepime for healthcare associated pneumonia along with Levaquin for atypicals.  Blood sugar has remained high for which Lantus has been ordered.  Patient admitted for sepsis.  Since patient also complained of low back pain patient had CT  lumbar spine and C-spine which did not show anything acute.  Patient also complained of periumbilical pain with poor appetite but no nausea vomiting or diarrhea.  Since there was concern for PICC line infection PICC line was removed.  Review of Systems: As per HPI, rest all negative.   Past Medical History:  Diagnosis Date  . Arthritis   . Diabetes mellitus without complication ([place])   . DOE (dyspnea on exertion) 07/12/2017  . FUO (fever of unknown origin) 07/12/2017  . Gastroenteritis 04/2016  . Headache(784.0)    hormonal headache  . Low back pain 07/12/2017  . Neuropathy    bilateral feet  . Obese   . Pleuritic chest pain 07/12/2017  . Pneumonia    hx 20+ years ago  . Sleep apnea    bipap 1 yr  . Ulcer of foot ([place])    toe ulcer greater than a year ago    Past Surgical History:  Procedure Laterality Date  . AMPUTATION Right 06/11/2017   Procedure: RIGHT LONG FINGER AMPUTATION DIGIT;  Surgeon: Milly Jakob, MD;  Location: [place];  Service: Orthopedics;  Laterality: Right;  . ANKLE FUSION Left 08/03/2014   Procedure: Left Tibiocalcaneal Fusion;  Surgeon: Newt Minion, MD;  Location: [place];  Service: Orthopedics;  Laterality: Left;  . APPLICATION OF WOUND VAC Left 08/03/2014   Procedure: APPLICATION OF WOUND VAC;  Surgeon: Newt Minion, MD;  Location: [place];  Service: Orthopedics;  Laterality: Left;  . I&D KNEE WITH POLY EXCHANGE Right 01/31/2013   Procedure: IRRIGATION AND DEBRIDEMENT  KNEE WITH POLY EXCHANGE possible Antibiotic Spacer;  Surgeon: Garald Balding, MD;  Location: [place];  Service: Orthopedics;  Laterality: Right;  . INCISION AND DRAINAGE Right 01/31/2013   ANTIBIOTIC SPACER  RIGHT KNEE  DR Durward Fortes   . KNEE ARTHROSCOPY     left knee  . ORIF ANKLE FRACTURE Left 03/16/2014   Procedure: Foot Excision Charcot Collapse,  Internal Fixation;  Surgeon: Newt Minion, MD;  Location: [place];  Service: Orthopedics;  Laterality: Left;  . REPLACEMENT TOTAL KNEE     left  .  TOTAL KNEE ARTHROPLASTY  06/23/2011   Procedure: TOTAL KNEE ARTHROPLASTY;  Surgeon: Garald Balding, MD;  Location: [place];  Service: Orthopedics;  Laterality: Right;  RIGHT TOTAL KNEE REPLACEMENT      reports that  has never smoked. she has never used smokeless tobacco. She reports that she drinks alcohol. She reports that she does not use drugs.  Allergies  Allergen Reactions  . Daptomycin Other (See Comments)    Develops severe chills, lower back pain, and delayed thinking after receiving this (symptoms eventually lessen)  . Vancomycin Other (See Comments)    Drives creatinine level(s) "through the roof" and they didn't return to normal for approx 14 days    Family History  Problem Relation Age of Onset  . Anesthesia problems Father   . Stroke Father   . Hypotension Neg Hx   . Malignant hyperthermia Neg Hx   . Pseudochol deficiency Neg Hx     Prior to Admission medications   Medication Sig Start Date End Date Taking? Authorizing Provider  DAPTOmycin in sodium chloride 0.9 % 100 mL Inject 975 mg into the vein daily.   Yes [provider]  doxycycline (VIBRA-TABS) 100 MG tablet Take 1 tablet (100 mg total) by mouth 2 (two) times daily. 07/11/17  Yes Tommy Medal, Lavell Islam, MD  HYDROcodone-acetaminophen [facility]) 10-325 MG tablet Take 1 tablet by mouth every 6 (six) hours as needed for moderate pain.  07/02/16  Yes [provider]  mupirocin ointment (BACTROBAN) 2 % Apply 1 application to the affected fingertip on left hand one to three times a day   Yes [provider]  ondansetron (ZOFRAN-ODT) 8 MG disintegrating tablet Take 8 mg by mouth every 8 (eight) hours as needed for nausea/vomiting. DISSOLVE IN THE MOUTH 04/30/16  Yes [provider]  oxyCODONE (OXY IR/ROXICODONE) 5 MG immediate release tablet Take 1-2 tablets (5-10 mg total) by mouth every 6 (six) hours as needed for severe pain or breakthrough pain. 06/12/17  Yes Milly Jakob, MD  Cyanocobalamin  (B-12) 1000 MCG TABS Take 1,000 mcg by mouth daily.    [provider]  diclofenac (VOLTAREN) 75 MG EC tablet Take 75 mg by mouth 2 (two) times daily.  02/28/14   [provider]  DULoxetine (CYMBALTA) 30 MG capsule Take 90 mg by mouth daily.     [provider]  Exenatide ER 2 MG PEN Inject 2 mg into the skin every Tuesday.  02/06/16   [provider]  glipiZIDE (GLUCOTROL XL) 10 MG 24 hr tablet Take 10 mg by mouth daily with breakfast.  05/23/17   [provider]  glucosamine-chondroitin 500-400 MG tablet Take 2 tablets by mouth daily.     [provider]  mirabegron ER (MYRBETRIQ) 50 MG TB24 tablet Take 50 mg by mouth every morning. 01/13/16   [provider]  Multiple Vitamin (MULTIVITAMIN WITH MINERALS) TABS Take 1 tablet by mouth daily.  [provider]  SitaGLIPtin-MetFORMIN HCl (JANUMET XR) 50-1000 MG TB24 Take 1 tablet by mouth at bedtime. 11/21/15   [provider]  vancomycin IVPB Inject 1,750 mg into the vein every 12 (twelve) hours. Indication:  Osteomyelitis Last Day of Therapy:  08/02/17 Labs - Sunday/Monday:  CBC/D, BMP, and vancomycin trough. Labs - Thursday:  BMP and vancomycin trough Labs - Every other week:  ESR and CRP Patient not taking: Reported on 07/12/2017 06/12/17 08/02/17  Bufford Lope, DO  VITAMIN A PO Take 5,000 mg by mouth daily.     [provider]    Physical Exam: Vitals:   07/12/17 2200 07/12/17 2221 07/12/17 2322 07/12/17 2323  BP: (!) 126/33  (!) 98/50   Pulse: (!) 116  (!) 111 (!) 111  Resp: 15  (!) 30 (!) 27  Temp:  (!) 103.1 F (39.5 C)    TempSrc:  Oral    SpO2: (!) 89%  90% 92%  Weight:      Height:          Constitutional: Moderately built and nourished. Vitals:   07/12/17 2200 07/12/17 2221 07/12/17 2322 07/12/17 2323  BP: (!) 126/33  (!) 98/50   Pulse: (!) 116  (!) 111 (!) 111  Resp: 15  (!) 30 (!) 27  Temp:  (!) 103.1 F (39.5 C)    TempSrc:  Oral     SpO2: (!) 89%  90% 92%  Weight:      Height:       Eyes: Anicteric no pallor. ENMT: No discharge from the ears eyes nose or mouth. Neck: No mass felt.  No neck rigidity.  No JVD appreciated. Respiratory: No rhonchi or crepitations. Cardiovascular: S1-S2 heard no murmurs appreciated. Abdomen: Soft nontender bowel sounds present. Musculoskeletal: Mild swelling of the ankles bilaterally. Skin: No rash. Neurologic: Alert awake oriented to time place and person.  Moves all extremities. Psychiatric: Appears normal.   Labs on Admission: I have personally reviewed following labs and imaging studies  CBC: Recent Labs  Lab 07/12/17 1133 07/12/17 1411  WBC 4.4 5.3  NEUTROABS 2.8 3.4  HGB 8.1* 8.5*  HCT 25.6* 26.3*  MCV 88.0 86.8  PLT 132* 448*   Basic Metabolic Panel: Recent Labs  Lab 07/12/17 1133 07/12/17 1411  NA 136 133*  K 3.9 4.3  CL 100* 97*  CO2 24 24  GLUCOSE 338* 371*  BUN 11 13  CREATININE 1.04* 1.05*  CALCIUM 8.9 9.4   GFR: Estimated Creatinine Clearance: 83.9 mL/min (A) (by C-G formula based on SCr of 1.05 mg/dL (H)). Liver Function Tests: Recent Labs  Lab 07/12/17 1133 07/12/17 1411  AST 29 30  ALT 30 30  ALKPHOS 96 96  BILITOT 0.5 0.6  PROT 6.0* 6.4*  ALBUMIN 2.8* 2.8*   No results for input(s): LIPASE, AMYLASE in the last 168 hours. No results for input(s): AMMONIA in the last 168 hours. Coagulation Profile: No results for input(s): INR, PROTIME in the last 168 hours. Cardiac Enzymes: No results for input(s): CKTOTAL, CKMB, CKMBINDEX, TROPONINI in the last 168 hours. BNP (last 3 results) No results for input(s): PROBNP in the last 8760 hours. HbA1C: No results for input(s): HGBA1C in the last 72 hours. CBG: No results for input(s): GLUCAP in the last 168 hours. Lipid Profile: No results for input(s): CHOL, HDL, LDLCALC, TRIG, CHOLHDL, LDLDIRECT in the last 72 hours. Thyroid Function Tests: No results for input(s): TSH, T4TOTAL, FREET4,  T3FREE, THYROIDAB in the last 72 hours.  Anemia Panel: No results for input(s): VITAMINB12, FOLATE, FERRITIN, TIBC, IRON, RETICCTPCT in the last 72 hours. Urine analysis:    Component Value Date/Time   COLORURINE YELLOW 07/12/2017 1623   APPEARANCEUR CLEAR 07/12/2017 1623   LABSPEC 1.023 07/12/2017 1623   PHURINE 6.0 07/12/2017 1623   GLUCOSEU >=500 (A) 07/12/2017 1623   HGBUR SMALL (A) 07/12/2017 1623   BILIRUBINUR NEGATIVE 07/12/2017 1623   KETONESUR NEGATIVE 07/12/2017 1623   PROTEINUR 30 (A) 07/12/2017 1623   UROBILINOGEN 0.2 03/23/2014 1211   NITRITE NEGATIVE 07/12/2017 1623   LEUKOCYTESUR NEGATIVE 07/12/2017 1623   Sepsis Labs: @LABRCNTIP (procalcitonin:4,lacticidven:4) )No results found for this or any previous visit (from the past 240 hour(s)).   Radiological Exams on Admission: Dg Chest 2 View  Result Date: 07/12/2017 CLINICAL DATA:  Shortness of breath for 2 days, mid and right chest pain. Chills. PICC line placed 8 weeks ago to treat MRSA. EXAM: CHEST - 2 VIEW COMPARISON:  Chest x-rays dated 03/23/2014 and 01/30/2013. FINDINGS: New diffuse reticulonodular opacities throughout both lungs. No pleural effusion or pneumothorax seen. Mild cardiomegaly is stable. Overall cardiomediastinal silhouette is stable. Left-sided PICC line appears appropriately positioned with tip at the level of the mid/lower SVC. No acute or suspicious osseous finding. IMPRESSION: 1. New diffuse reticulonodular opacities throughout both lungs. Differential includes pulmonary edema related to CHF/volume overload and atypical pneumonia. Clinical history raises the additional possibility of septic emboli. Would consider chest CT for further characterization. 2. Left-sided PICC line in place with tip appropriately positioned at the level of the mid/lower SVC. 3. Stable mild cardiomegaly. Electronically Signed   By: Franki Cabot M.D.   On: 07/12/2017 15:15   Ct Angio Chest Pe W And/or Wo Contrast  Result Date:  07/12/2017 CLINICAL DATA:  Chest pain with exertion. Shortness of breath over the last day. Positive D-dimer. PICC line placement for treatment of staph infection. EXAM: CT ANGIOGRAPHY CHEST WITH CONTRAST TECHNIQUE: Multidetector CT imaging of the chest was performed using the standard protocol during bolus administration of intravenous contrast. Multiplanar CT image reconstructions and MIPs were obtained to evaluate the vascular anatomy. CONTRAST:  100 cc Isovue 370 COMPARISON:  Chest radiography same day. FINDINGS: Cardiovascular: Pulmonary arterial opacification is good. No pulmonary emboli. Heart size is normal. There is mild aortic atherosclerosis and coronary artery calcification. No pericardial fluid. Mediastinum/Nodes: No mass or lymphadenopathy. Lungs/Pleura: No pleural effusion. Widespread patchy bilateral airspace density, upper lobe predominant, worse on the right than the left, most consistent with bronchopneumonia. The differential diagnosis would include acute allergic alveolitis and sepsis/ARDS. Acute edema or acute pulmonary hemorrhage would be less likely given this pattern. Upper Abdomen: Negative Musculoskeletal: Ordinary mild degenerative changes. Review of the MIP images confirms the above findings. IMPRESSION: No pulmonary emboli. Widespread bilateral airspace filling pattern most consistent with widespread bronchopneumonia. Findings could be seen in the setting of sepsis/ARDS or acute allergic alveolitis. Aortic Atherosclerosis (ICD10-I70.0). Electronically Signed   By: Nelson Chimes M.D.   On: 07/12/2017 20:06   Ct Lumbar Spine W Contrast  Result Date: 07/12/2017 CLINICAL DATA:  Possible sepsis.  Back pain. EXAM: CT LUMBAR SPINE WITH CONTRAST TECHNIQUE: Multidetector CT imaging of the lumbar spine was performed with intravenous contrast administration. CONTRAST:  100 cc Isovue 370 COMPARISON:  05/04/2016 FINDINGS: Segmentation: 5 lumbar type vertebral bodies. Alignment: 3 mm  anterolisthesis L4-5.  2 mm anterolisthesis L5-S1. Vertebrae: No sign of fracture. No finding to suggest the presence of osteomyelitis. No other sign of spinal infection. Paraspinal and other  soft tissues: Negative Disc levels: Ordinary degenerative changes at L2-3 and above. L3-4: Disc bulge.  Facet hypertrophy.  Mild lateral recess stenosis. L4-5: Advanced bilateral facet arthropathy with 3 mm anterolisthesis. Circumferential bulging of the disc. Severe multifactorial spinal stenosis that could be symptomatic. L5-S1: Advanced bilateral facet arthropathy with 2 mm anterolisthesis. Circumferential bulging of the disc. Stenosis of the lateral recesses and foramina that could be symptomatic. Bilateral sacroiliac osteoarthritis. IMPRESSION: No sign of spinal infection in the lumbar region. Multifactorial spinal stenosis at L4-5 and L5-S1 that could be a cause of back pain or neural compressive symptoms. Electronically Signed   By: Nelson Chimes M.D.   On: 07/12/2017 20:28   Ct T-spine No Charge  Result Date: 07/12/2017 CLINICAL DATA:  Mid back pain EXAM: CT THORACIC SPINE WITHOUT CONTRAST TECHNIQUE: Multidetector CT images of the thoracic were obtained using the standard protocol without intravenous contrast. COMPARISON:  Chest CT 07/12/2017 FINDINGS: Alignment: Normal. Vertebrae: There is mild multilevel thoracic vertebral bite a generative height loss with anterior osteophyte formation. No fracture. Mild multilevel disc space narrowing. Paraspinal and other soft tissues: Extensive airspace opacities within both lungs, better characterized on concomitant chest CT. Disc levels: Disc osteophyte complex at T6-T7 mildly narrows the spinal canal. No other spinal canal stenosis. Neural foramina are clearly patent. IMPRESSION: 1. No acute abnormality of the thoracic spine. 2. Mild narrowing of the spinal canal by a central disc osteophyte complex at T6-T7. 3. Confluent airspace disease, better characterized on concomitant  chest CT. Please see the dedicated report for that study. Electronically Signed   By: Ulyses Jarred M.D.   On: 07/12/2017 20:42    EKG: Independently reviewed.  Normal sinus rhythm with nonspecific ST-T changes.  Assessment/Plan Principal Problem:   Sepsis ([place]) Active Problems:   Uncontrolled type 2 diabetes mellitus with hyperglycemia ([place])    1. Severe sepsis -source could be possible pneumonia versus PICC line.  PICC line has been removed.  Patient has been placed on Zyvox and cefepime and also on Levaquin.  Since patient is complaining of abdominal pain CT abdomen has been ordered which is pending.  Continue with aggressive hydration follow lactate pro calcitonin levels and also will check influenza PCR urine for Legionella and strep antigen. 2. Uncontrolled diabetes mellitus type 2 -I have placed patient on Lantus 10 units along with sliding scale coverage.  Closely follow CBGs and metabolic panel. 3. Acute renal failure likely from recent use of vancomycin.  Follow metabolic panel.  Avoid vancomycin. 4. Normocytic normochromic anemia with mild worsening of hemoglobin.  Follow CBC.  Will check peripheral smear yesterday since patient also has thrombocytopenia likely from infectious cause. 5. Thrombocytopenia -likely from sepsis.  Will check peripheral smear studies to rule out any schistocytes. 6. History of Charcot's foot with neuropathy patient wears braces. 7. Sleep apnea on BiPAP. 8. Recent admission for osteomyelitis of the right hand middle finger which was partially amputated.  Patient's left hand also has some abrasion on the fingertips which does not look infected.   DVT prophylaxis: Lovenox.  May have to hold Lovenox if there is further decline in platelets. Code Status: Full code. Family Communication: Discussed with patient. Disposition Plan: Home. Consults called: Pulmonary critical care. Admission status: Inpatient.   Rise Patience MD [facility] Pager  [phone].  If 7PM-7AM, please contact night-coverage [email or web address] Password [facility]  07/12/2017, 11:59 PM

## 2017-07-13 ENCOUNTER — Other Ambulatory Visit: Payer: Self-pay

## 2017-07-13 ENCOUNTER — Inpatient Hospital Stay (HOSPITAL_COMMUNITY): Payer: BLUE CROSS/BLUE SHIELD

## 2017-07-13 ENCOUNTER — Other Ambulatory Visit: Payer: Self-pay | Admitting: Pharmacist

## 2017-07-13 DIAGNOSIS — Z881 Allergy status to other antibiotic agents status: Secondary | ICD-10-CM

## 2017-07-13 DIAGNOSIS — B961 Klebsiella pneumoniae [K. pneumoniae] as the cause of diseases classified elsewhere: Secondary | ICD-10-CM

## 2017-07-13 DIAGNOSIS — R6521 Severe sepsis with septic shock: Secondary | ICD-10-CM

## 2017-07-13 DIAGNOSIS — E1165 Type 2 diabetes mellitus with hyperglycemia: Secondary | ICD-10-CM

## 2017-07-13 DIAGNOSIS — R7881 Bacteremia: Secondary | ICD-10-CM | POA: Diagnosis present

## 2017-07-13 DIAGNOSIS — Z89021 Acquired absence of right finger(s): Secondary | ICD-10-CM

## 2017-07-13 DIAGNOSIS — M869 Osteomyelitis, unspecified: Secondary | ICD-10-CM

## 2017-07-13 DIAGNOSIS — I959 Hypotension, unspecified: Secondary | ICD-10-CM

## 2017-07-13 DIAGNOSIS — E1140 Type 2 diabetes mellitus with diabetic neuropathy, unspecified: Secondary | ICD-10-CM

## 2017-07-13 DIAGNOSIS — R0602 Shortness of breath: Secondary | ICD-10-CM | POA: Diagnosis present

## 2017-07-13 DIAGNOSIS — T7840XA Allergy, unspecified, initial encounter: Secondary | ICD-10-CM | POA: Diagnosis present

## 2017-07-13 DIAGNOSIS — M546 Pain in thoracic spine: Secondary | ICD-10-CM

## 2017-07-13 DIAGNOSIS — R05 Cough: Secondary | ICD-10-CM

## 2017-07-13 DIAGNOSIS — Z8701 Personal history of pneumonia (recurrent): Secondary | ICD-10-CM

## 2017-07-13 DIAGNOSIS — Z87448 Personal history of other diseases of urinary system: Secondary | ICD-10-CM

## 2017-07-13 DIAGNOSIS — T361X5A Adverse effect of cephalosporins and other beta-lactam antibiotics, initial encounter: Secondary | ICD-10-CM

## 2017-07-13 DIAGNOSIS — A419 Sepsis, unspecified organism: Secondary | ICD-10-CM | POA: Diagnosis present

## 2017-07-13 DIAGNOSIS — A4159 Other Gram-negative sepsis: Secondary | ICD-10-CM

## 2017-07-13 DIAGNOSIS — L271 Localized skin eruption due to drugs and medicaments taken internally: Secondary | ICD-10-CM

## 2017-07-13 DIAGNOSIS — Z8631 Personal history of diabetic foot ulcer: Secondary | ICD-10-CM

## 2017-07-13 DIAGNOSIS — T827XXA Infection and inflammatory reaction due to other cardiac and vascular devices, implants and grafts, initial encounter: Secondary | ICD-10-CM

## 2017-07-13 DIAGNOSIS — E669 Obesity, unspecified: Secondary | ICD-10-CM

## 2017-07-13 DIAGNOSIS — B9562 Methicillin resistant Staphylococcus aureus infection as the cause of diseases classified elsewhere: Secondary | ICD-10-CM

## 2017-07-13 LAB — CBC
HCT: 24.2 % — ABNORMAL LOW (ref 36.0–46.0)
HEMOGLOBIN: 7.9 g/dL — AB (ref 12.0–15.0)
MCH: 28.6 pg (ref 26.0–34.0)
MCHC: 32.6 g/dL (ref 30.0–36.0)
MCV: 87.7 fL (ref 78.0–100.0)
Platelets: 89 10*3/uL — ABNORMAL LOW (ref 150–400)
RBC: 2.76 MIL/uL — AB (ref 3.87–5.11)
RDW: 15.6 % — ABNORMAL HIGH (ref 11.5–15.5)
WBC: 6.4 10*3/uL (ref 4.0–10.5)

## 2017-07-13 LAB — BLOOD CULTURE ID PANEL (REFLEXED)
Acinetobacter baumannii: NOT DETECTED
CANDIDA KRUSEI: NOT DETECTED
CANDIDA PARAPSILOSIS: NOT DETECTED
CANDIDA TROPICALIS: NOT DETECTED
CARBAPENEM RESISTANCE: NOT DETECTED
Candida albicans: NOT DETECTED
Candida glabrata: NOT DETECTED
Enterobacter cloacae complex: NOT DETECTED
Enterobacteriaceae species: DETECTED — AB
Enterococcus species: NOT DETECTED
Escherichia coli: NOT DETECTED
HAEMOPHILUS INFLUENZAE: NOT DETECTED
KLEBSIELLA OXYTOCA: NOT DETECTED
KLEBSIELLA PNEUMONIAE: DETECTED — AB
Listeria monocytogenes: NOT DETECTED
Neisseria meningitidis: NOT DETECTED
PROTEUS SPECIES: NOT DETECTED
PSEUDOMONAS AERUGINOSA: NOT DETECTED
SERRATIA MARCESCENS: NOT DETECTED
STAPHYLOCOCCUS AUREUS BCID: NOT DETECTED
STAPHYLOCOCCUS SPECIES: NOT DETECTED
STREPTOCOCCUS PYOGENES: NOT DETECTED
Streptococcus agalactiae: NOT DETECTED
Streptococcus pneumoniae: NOT DETECTED
Streptococcus species: NOT DETECTED

## 2017-07-13 LAB — CBC WITH DIFFERENTIAL/PLATELET
BASOS PCT: 0 %
Basophils Absolute: 0 10*3/uL (ref 0.0–0.1)
EOS ABS: 0 10*3/uL (ref 0.0–0.7)
Eosinophils Relative: 0 %
HCT: 26.8 % — ABNORMAL LOW (ref 36.0–46.0)
HEMOGLOBIN: 8.6 g/dL — AB (ref 12.0–15.0)
Lymphocytes Relative: 8 %
Lymphs Abs: 0.9 10*3/uL (ref 0.7–4.0)
MCH: 27.9 pg (ref 26.0–34.0)
MCHC: 32.1 g/dL (ref 30.0–36.0)
MCV: 87 fL (ref 78.0–100.0)
Monocytes Absolute: 0.3 10*3/uL (ref 0.1–1.0)
Monocytes Relative: 2 %
NEUTROS PCT: 90 %
Neutro Abs: 10.4 10*3/uL — ABNORMAL HIGH (ref 1.7–7.7)
PLATELETS: 122 10*3/uL — AB (ref 150–400)
RBC: 3.08 MIL/uL — AB (ref 3.87–5.11)
RDW: 15.4 % (ref 11.5–15.5)
WBC: 11.6 10*3/uL — AB (ref 4.0–10.5)

## 2017-07-13 LAB — COMPREHENSIVE METABOLIC PANEL
ALK PHOS: 116 U/L (ref 38–126)
ALT: 28 U/L (ref 14–54)
ANION GAP: 12 (ref 5–15)
AST: 33 U/L (ref 15–41)
Albumin: 2.3 g/dL — ABNORMAL LOW (ref 3.5–5.0)
BUN: 12 mg/dL (ref 6–20)
CALCIUM: 8.2 mg/dL — AB (ref 8.9–10.3)
CHLORIDE: 99 mmol/L — AB (ref 101–111)
CO2: 20 mmol/L — AB (ref 22–32)
Creatinine, Ser: 1.14 mg/dL — ABNORMAL HIGH (ref 0.44–1.00)
GFR calc non Af Amer: 55 mL/min — ABNORMAL LOW (ref >60)
GLUCOSE: 291 mg/dL — AB (ref 65–99)
Potassium: 3.7 mmol/L (ref 3.5–5.1)
SODIUM: 131 mmol/L — AB (ref 135–145)
Total Bilirubin: 1.2 mg/dL (ref 0.3–1.2)
Total Protein: 5.9 g/dL — ABNORMAL LOW (ref 6.5–8.1)

## 2017-07-13 LAB — PROCALCITONIN: PROCALCITONIN: 55.34 ng/mL

## 2017-07-13 LAB — TROPONIN I
TROPONIN I: 0.04 ng/mL — AB (ref <0.03)
TROPONIN I: 0.05 ng/mL — AB (ref <0.03)
Troponin I: 0.05 ng/mL (ref <0.03)

## 2017-07-13 LAB — GLUCOSE, CAPILLARY: Glucose-Capillary: 193 mg/dL — ABNORMAL HIGH (ref 65–99)

## 2017-07-13 LAB — CREATININE, SERUM
CREATININE: 1.33 mg/dL — AB (ref 0.44–1.00)
GFR, EST AFRICAN AMERICAN: 53 mL/min — AB (ref >60)
GFR, EST NON AFRICAN AMERICAN: 45 mL/min — AB (ref >60)

## 2017-07-13 LAB — I-STAT CG4 LACTIC ACID, ED
LACTIC ACID, VENOUS: 3.28 mmol/L — AB (ref 0.5–1.9)
Lactic Acid, Venous: 1.86 mmol/L (ref 0.5–1.9)

## 2017-07-13 LAB — LACTIC ACID, PLASMA
LACTIC ACID, VENOUS: 2.5 mmol/L — AB (ref 0.5–1.9)
Lactic Acid, Venous: 2.3 mmol/L (ref 0.5–1.9)

## 2017-07-13 LAB — LACTATE DEHYDROGENASE: LDH: 175 U/L (ref 98–192)

## 2017-07-13 LAB — CBG MONITORING, ED
GLUCOSE-CAPILLARY: 269 mg/dL — AB (ref 65–99)
GLUCOSE-CAPILLARY: 240 mg/dL — AB (ref 65–99)

## 2017-07-13 LAB — I-STAT BETA HCG BLOOD, ED (MC, WL, AP ONLY): I-stat hCG, quantitative: <5 m[IU]/mL (ref <5)

## 2017-07-13 LAB — I-STAT TROPONIN, ED: Troponin i, poc: 0 ng/mL (ref 0.00–0.08)

## 2017-07-13 LAB — INFLUENZA PANEL BY PCR (TYPE A & B)
INFLAPCR: NEGATIVE
Influenza B By PCR: NEGATIVE

## 2017-07-13 LAB — STREP PNEUMONIAE URINARY ANTIGEN: Strep Pneumo Urinary Antigen: NEGATIVE

## 2017-07-13 LAB — PREGNANCY, URINE: Preg Test, Ur: NEGATIVE

## 2017-07-13 MED ORDER — SODIUM CHLORIDE 0.9 % IV SOLN
2.0000 g | INTRAVENOUS | Status: DC
  Administered 2017-07-13: 2 g via INTRAVENOUS
  Filled 2017-07-13: qty 20

## 2017-07-13 MED ORDER — LINEZOLID 600 MG PO TABS
600.0000 mg | ORAL_TABLET | Freq: Two times a day (BID) | ORAL | Status: DC
  Administered 2017-07-13 – 2017-07-14 (×2): 600 mg via ORAL
  Filled 2017-07-13 (×2): qty 1

## 2017-07-13 MED ORDER — IOPAMIDOL (ISOVUE-300) INJECTION 61%
INTRAVENOUS | Status: AC
  Filled 2017-07-13: qty 30

## 2017-07-13 MED ORDER — LEVOFLOXACIN IN D5W 750 MG/150ML IV SOLN
750.0000 mg | Freq: Every day | INTRAVENOUS | Status: DC
  Administered 2017-07-13: 750 mg via INTRAVENOUS
  Filled 2017-07-13: qty 150

## 2017-07-13 MED ORDER — SODIUM CHLORIDE 0.9 % IV SOLN
1.0000 g | Freq: Three times a day (TID) | INTRAVENOUS | Status: DC
  Administered 2017-07-13: 1 g via INTRAVENOUS
  Filled 2017-07-13 (×2): qty 1

## 2017-07-13 MED ORDER — LEVOFLOXACIN 750 MG PO TABS
750.0000 mg | ORAL_TABLET | Freq: Every day | ORAL | Status: DC
  Administered 2017-07-13 – 2017-07-15 (×3): 750 mg via ORAL
  Filled 2017-07-13 (×4): qty 1

## 2017-07-13 MED ORDER — MIRABEGRON ER 25 MG PO TB24
50.0000 mg | ORAL_TABLET | Freq: Every morning | ORAL | Status: DC
  Administered 2017-07-13 – 2017-07-15 (×3): 50 mg via ORAL
  Filled 2017-07-13 (×2): qty 1
  Filled 2017-07-13: qty 2

## 2017-07-13 MED ORDER — SODIUM CHLORIDE 0.9 % IV SOLN
0.0000 ug/min | INTRAVENOUS | Status: DC
  Administered 2017-07-13: 50 ug/min via INTRAVENOUS
  Administered 2017-07-13: 20 ug/min via INTRAVENOUS
  Administered 2017-07-13: 50 ug/min via INTRAVENOUS
  Filled 2017-07-13 (×2): qty 10
  Filled 2017-07-13: qty 1
  Filled 2017-07-13: qty 10
  Filled 2017-07-13: qty 1

## 2017-07-13 MED ORDER — SODIUM CHLORIDE 0.9 % IV BOLUS (SEPSIS)
2000.0000 mL | Freq: Once | INTRAVENOUS | Status: AC
  Administered 2017-07-13: 2000 mL via INTRAVENOUS

## 2017-07-13 NOTE — Progress Notes (Signed)
Pharmacy Antibiotic Note  [name] is a 52 y.o. female admitted on 07/12/2017 with fever/chills, possible line sepsis/HCAP .  PICC removed.  Was being treated for MRSA osteomyelitis.  Zyvox ordered.  Pharmacy has been consulted for Cefepime dosing.    Plan: Cefepime 1 gIV q8h  Height: 5\' 4"  (162.6 cm) Weight: 281 lb (127.5 kg) IBW/kg (Calculated) : 54.7  Temp (24hrs), Avg:100 F (37.8 C), Min:98.3 F (36.8 C), Max:103.1 F (39.5 C)  Recent Labs  Lab 07/12/17 1133 07/12/17 1411 07/12/17 1431 07/12/17 2343  WBC 4.4 5.3  --   --   CREATININE 1.04* 1.05*  --   --   LATICACIDVEN  --   --  2.95* 2.46*    Estimated Creatinine Clearance: 83.9 mL/min (A) (by C-G formula based on SCr of 1.05 mg/dL (H)).    Allergies  Allergen Reactions  . Daptomycin Other (See Comments)    Develops severe chills, lower back pain, and delayed thinking after receiving this (symptoms eventually lessen)  . Vancomycin Other (See Comments)    Drives creatinine level(s) "through the roof" and they didn't return to normal for approx 14 days    Eddie Candlebbott, Sarinah Doetsch [place] 07/13/2017 12:15 AM

## 2017-07-13 NOTE — ED Notes (Signed)
Patient transported to CT 

## 2017-07-13 NOTE — Consult Note (Addendum)
[facility]    Date of Admission:  07/12/2017     Total days of antibiotics 1  Day 31 for MRSA osteomyelitis treatment - linezolid now   Day 1 ceftriaxone              Reason for Consult: Bacteremia     Referring Provider: PCCM Team   Attending attestation:  I have seen and examined [name] today and discussed her complex management with Rexene Alberts NP.  I am in agreement with her assessment and plans.  [name] has had multiple acute problems recently.  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  She underwent surgical debridement and was discharged on IV vancomycin but developed nephrotoxicity and was changed to daptomycin several weeks ago.  She began to have fever and rigors about 1 hour after starting her daptomycin infusions through her PICC.  This led to admission yesterday when she was found to have sepsis and hypotension.  1 of 2 admission blood cultures drawn through the previous PICC has already grown Klebsiella.  She was given aggressive fluid repletion and then became short of breath.  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  On top of all of that just as I entered her room in the emergency department she had developed diffuse itching, worsening shortness of breath and a sensation that her throat was closing up.  As I stood there for 5 minutes she began to develop a splotchy red rash on her back and upper chest.  Her ceftriaxone infusion was stopped and she slowly began to feel better.  I think it is best to stick with an oral regimen of linezolid and levofloxacin and avoid placing another central line.  Once final culture results are available we will recommend optimal antibiotics for discharge.  Cliffton Asters, MD [facility] for [facility] Disease [facility] [phone] pager   [phone] cell 07/13/2017, 1:20 PM  Assessment: 52 y.o. female admitted with fevers/chills, back pain, muscle aches and pleuritic chest pain. She has already started growing gram negative rods on blood culture from PICC line with rapid ID showing klebsiella pneumoniae. She feels much better than yesterday and has had no fevers today. PICC line is out. With quick growth on blood culture from PICC line would associate her bacteremia to be related to line infection. She remains on 50 mcg of phenylephrine to maintain adequate hemodynamics. Eating and drinking well.   She is on day 31 of therapy for her MRSA osteomyelitis. He finger looks very good overall with incision site well-healed, no tenderness (although she has significant neuropathy) or crepitus. Considering she is s/p amputation and now > 4 weeks of IV therapy would consider completing regimen with oral therapy given line related event.   Shortness of breath with thoracic back pain, dry infrequent cough. She is on 2 L oxygen and saturating well at rest but still continues to have tightness in the absence of wheezing. 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 Will discuss with Dr. Orvan Falconer about role of steroids.   Plan: 1. Will change IV therapy to Ceftriaxone IV to cover klebsiella pneumoniae on BCID 2. Can change linezolid to PO as it is just as bioavailable as IV route - she has been off her cymbalta for nearly a  month now. Please continue to hold this medication while receiving linezolid as there is a drug interaction between the two.  3. Likely does not need echo at this point given suggested line related sepsis.    ADDENDUM: 07/13/2017 1:10 PM  Developed allergic reaction on Ceftriaxone - will change to Levaquin and oral linezolid and follow up with culture and sensitivities.   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 enoxaparin (LOVENOX) injection  40 mg Subcutaneous Daily  . insulin aspart  0-9 Units Subcutaneous TID WC  . insulin glargine  10 Units Subcutaneous QHS  . iopamidol      . levofloxacin  750 mg Oral Daily  . linezolid  600 mg Oral Q12H  . mirabegron ER  50 mg Oral q morning - 10a  . multivitamin with minerals  1 tablet Oral Daily  . vitamin B-12  1,000 mcg Oral Daily    HPI: [name] is a 52 y.o. female with  has a past medical history of Arthritis, Diabetes mellitus without complication (HCC), DOE (dyspnea on exertion) (07/12/2017), FUO (fever of unknown origin) (07/12/2017), Gastroenteritis (04/2016), Headache(784.0), Low back pain (07/12/2017), Neuropathy, Obese, Pleuritic chest pain (07/12/2017), Pneumonia, Sleep apnea, and Ulcer of foot (HCC).  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 Isla Pence Thompson 06/11/17) for MRSA osteomyelitis - requested by her surgeon to extend out course of antibiotics beyond what is routinely recommended following amputation due to concern for residual microscopic infection after surgery. She was not bacteremic at this hospitalization. She was discharged on 8 weeks of IV therapy for this with stop date 08/02/17 with plans to follow up with Dr. Ninetta LightsHatcher in ID clinic on 3/20. Originally on Vancomycin then changed to Daptomycin on 3/1 with rising creatinine. She called Dr. Daiva EvesVan Dam on the night of 3/17 to report shaking chills, muscle aches and back pain with each infusion of daptomycin - she was instructed to hold further IV doses and start oral doxycycline. She was brought to clinic yesterday and also found to have pleuritic chest pain in addition to the above mentioned symptoms and the decision was made to have her admitted  to the hospital for further work up/management.   In discussion with [name] she reported that Saturday made her 4th day with the shaking chills/back pain and abdominal pain she experienced within an hour of daptomycin infusion. She infused her medication nightly at 9-10 o'clock. The episodes would last 3 hours ending in a sweat and slow progression to feeling better. She attributed these events to other things (hormones) until the 4th night when she made connection to infusions. She did not have any shortness of breath or cough prior to hospitalization but did report some chest pain. She developed shortness of breath yesterday in the ED during administration of IV medications/fluids; described it to be more tight feeling. She has an infrequent dry cough. Pain in the abdomen has eased off. Lower back pain resolved but now with back pain in the thoracic region. No urinary complaints.   Hospital Course:  In the emergency room she was found to be febrile to 103.1 last PM with associated shaking chills and back pain, WBC 11.6k. She was  started on levofloxacin, linezolid and cefepime IV. Blood cultures were drawn (1 from the PICC line itself) and at 12 hours grew gram negative rods - BCID with klebsiella pneumonia.   CT scan of her chest >> widespread patchy bilateral airspace density, predominant in the upper lobe R>L most consistent with bronchopneumonia; differential also to include acute allergic alveolitis and ARDS; less likely acute pulmonary edema.   Abdominal CT scan >> mild perinephric edema noted, may represent pyelonephritis.   CK result on 3/11 outpatient was 38.   Review of Systems: Review of Systems  Constitutional: Positive for chills, diaphoresis, fever and malaise/fatigue.  HENT: Negative for sore throat and tinnitus.   Eyes: Negative for blurred vision and photophobia.  Respiratory: Positive for cough and shortness of breath. Negative for sputum production.   Cardiovascular:  Negative for chest pain and leg swelling.  Gastrointestinal: Negative for diarrhea, nausea and vomiting.  Genitourinary: Negative for dysuria.  Musculoskeletal: Positive for back pain (thoracic region now) and myalgias.  Skin: Negative for rash.  Neurological: Negative for headaches.    Past Medical History:  Diagnosis Date  . Arthritis   . Diabetes mellitus without complication (HCC)   . DOE (dyspnea on exertion) 07/12/2017  . FUO (fever of unknown origin) 07/12/2017  . Gastroenteritis 04/2016  . Headache(784.0)    hormonal headache  . Low back pain 07/12/2017  . Neuropathy    bilateral feet  . Obese   . Pleuritic chest pain 07/12/2017  . Pneumonia    hx 20+ years ago  . Sleep apnea    bipap 1 yr  . Ulcer of foot (HCC)    toe ulcer greater than a year ago    Social History   Tobacco Use  . Smoking status: Never Smoker  . Smokeless tobacco: Never Used  Substance Use Topics  . Alcohol use: Yes    Alcohol/week: 0.0 oz    Comment: occ  . Drug use: No    Family History  Problem Relation Age of Onset  . Anesthesia problems Father   . Stroke Father   . Hypotension Neg Hx   . Malignant hyperthermia Neg Hx   . Pseudochol deficiency Neg Hx    Allergies  Allergen Reactions  . Rocephin [Ceftriaxone] Shortness Of Breath and Swelling  . Daptomycin Other (See Comments)    Develops severe chills, lower back pain, and delayed thinking after receiving this (symptoms eventually lessen)  . Vancomycin Other (See Comments)    Drives creatinine level(s) "through the roof" and they didn't return to normal for approx 14 days    OBJECTIVE: Blood pressure 116/76, pulse 77, temperature 98.6 F (37 C), temperature source Oral, resp. rate (!) 22, height 5\' 4"  (1.626 m), weight 281 lb (127.5 kg), SpO2 99 %.  Physical Exam  Constitutional: She is oriented to person, place, and time and well-developed, well-nourished, and in no distress.  Lying in bed, appears a little uncomfortable but  able to converse easily.   HENT:  Mouth/Throat: No oral lesions. No dental abscesses.  Eyes: Pupils are equal, round, and reactive to light. No scleral icterus.  Neck: Normal range of motion.  Cardiovascular: Normal rate, regular rhythm and normal heart sounds. Exam reveals no friction rub.  No murmur heard. Pulmonary/Chest: Effort normal and breath sounds normal. No respiratory distress. She has no wheezes. She has no rales.  Abdominal: Soft. Bowel sounds are normal. She exhibits no distension. There is no tenderness.  Musculoskeletal: Normal range of motion.  Thoracic back: She exhibits tenderness. She exhibits no edema and no deformity. Swelling: generalized around spine.       Lumbar back: She exhibits no tenderness, no bony tenderness and no deformity.       Arms: Right middle finger slight erythema, well healed incision site, no warmth or swelling.   Lymphadenopathy:    She has no cervical adenopathy.  Neurological: She is alert and oriented to person, place, and time.  Skin: Skin is warm and dry. No rash noted.  Psychiatric: Mood, affect and judgment normal.  Nursing note and vitals reviewed.   Lab Results Lab Results  Component Value Date   WBC 11.6 (H) 07/13/2017   HGB 8.6 (L) 07/13/2017   HCT 26.8 (L) 07/13/2017   MCV 87.0 07/13/2017   PLT 122 (L) 07/13/2017    Lab Results  Component Value Date   CREATININE 1.14 (H) 07/13/2017   BUN 12 07/13/2017   NA 131 (L) 07/13/2017   K 3.7 07/13/2017   CL 99 (L) 07/13/2017   CO2 20 (L) 07/13/2017    Lab Results  Component Value Date   ALT 28 07/13/2017   AST 33 07/13/2017   ALKPHOS 116 07/13/2017   BILITOT 1.2 07/13/2017     Microbiology: 06/11/17 Right #3 finger tissue >> MRSA (S-tetracycline, bactrim, vancomycin, clindamycin) BCx 07/12/17 >> GNRs @ 12 hour mark - BCID K. Pneumoniae  Rexene Alberts, MSN, NP-C [facility] for Infectious Disease [facility] Cell: [phone] Pager:  [phone]  07/13/2017 1:10 PM

## 2017-07-13 NOTE — Progress Notes (Signed)
eLink Physician-Brief Progress Note [name] DOB: 01/08/1966 MRN: [id number]   Date of Service  07/13/2017  HPI/Events of Note  Hx of OSA - Patient on nocturnal BiPAP at home.   eICU Interventions  Will order BiPAP per home settings Q HS.      Intervention Category Major Interventions: Other:  Lenell AntuSommer,Steven Eugene 07/13/2017, 10:31 PM

## 2017-07-13 NOTE — Consult Note (Signed)
PULMONARY / CRITICAL CARE MEDICINE   Name: [name] MRN: [id number] DOB: January 29, 1966    ADMISSION DATE:  07/12/2017 CONSULTATION DATE: 07/13/2017  REFERRING MD: Dr. Hal Hope  CHIEF COMPLAINT: Septic shock  HISTORY OF PRESENT ILLNESS:   52 year old female with past medical history as below which is significant for diabetes mellitus, peripheral neuropathy, obesity, obstructive sleep apnea, and osteomyelitis of the right middle finger.  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  She was treated with antibiotics and was ultimately discharged on vancomycin via PICC line.  Initially she tolerated this well, however, eventually her renal function began to deteriorate and she was transitioned to daptomycin.  She took this for about a week before developing fevers/chills consistently about an hour after taking it.  She contacted the infectious disease specialists and was changed to oral doxycycline and was instructed to present to the emergency department if she did not feel better.  She has also developed some pleuritic chest pain.  She presented to the ED 3/18.  In the ED she was noted to be tachycardic and febrile to 103 F.  She was also hypotensive which initially improved with IV fluids.  Given her pleuritic chest pain she went for CT angiogram of the chest to rule out PE.  No embolus was identified, however, diffuse bilateral airspace disease was noted and was concerning for pneumonia.  She was started on broad-spectrum antibiotics including atypical coverage for pneumonia.  PICC line was removed as a potential source for infection.  Despite the aforementioned therapies, she remained hypotensive and was started on phenylephrine for blood pressure support.  PCCM asked to see.  PAST MEDICAL HISTORY :  She  has a past medical history of Arthritis, Diabetes mellitus without complication ([place]s), DOE (dyspnea on  exertion) (07/12/2017), FUO (fever of unknown origin) (07/12/2017), Gastroenteritis (04/2016), Headache(784.0), Low back pain (07/12/2017), Neuropathy, Obese, Pleuritic chest pain (07/12/2017), Pneumonia, Sleep apnea, and Ulcer of foot (Bell).  PAST SURGICAL HISTORY: She  has a past surgical history that includes Knee arthroscopy; Replacement total knee; Total knee arthroplasty (06/23/2011); Incision and drainage (Right, 01/31/2013); I&D knee with poly exchange (Right, 01/31/2013); ORIF ankle fracture (Left, 03/16/2014); Ankle fusion (Left, 08/03/2014); Application if wound vac (Left, 08/03/2014); and Amputation (Right, 06/11/2017).  Allergies  Allergen Reactions  . Daptomycin Other (See Comments)    Develops severe chills, lower back pain, and delayed thinking after receiving this (symptoms eventually lessen)  . Vancomycin Other (See Comments)    Drives creatinine level(s) "through the roof" and they didn't return to normal for approx 14 days    No current facility-administered medications on file prior to encounter.    Current Outpatient Medications on File Prior to Encounter  Medication Sig  . DAPTOmycin in sodium chloride 0.9 % 100 mL Inject 975 mg into the vein daily.  Marland Kitchen doxycycline (VIBRA-TABS) 100 MG tablet Take 1 tablet (100 mg total) by mouth 2 (two) times daily.  Marland Kitchen HYDROcodone-acetaminophen (NORCO) 10-325 MG tablet Take 1 tablet by mouth every 6 (six) hours as needed for moderate pain.   . mupirocin ointment (BACTROBAN) 2 % Apply 1 application to the affected fingertip on left hand one to three times a day  . ondansetron (ZOFRAN-ODT) 8 MG disintegrating tablet Take 8 mg by mouth every 8 (eight) hours as needed for nausea/vomiting. DISSOLVE IN THE MOUTH  . oxyCODONE (OXY IR/ROXICODONE) 5 MG immediate release tablet Take 1-2 tablets (5-10 mg total) by  mouth every 6 (six) hours as needed for severe pain or breakthrough pain.  . Cyanocobalamin (B-12) 1000 MCG TABS Take 1,000 mcg by mouth daily.  .  diclofenac (VOLTAREN) 75 MG EC tablet Take 75 mg by mouth 2 (two) times daily.   . DULoxetine (CYMBALTA) 30 MG capsule Take 90 mg by mouth daily.   . Exenatide ER 2 MG PEN Inject 2 mg into the skin every Tuesday.   Marland Kitchen glipiZIDE (GLUCOTROL XL) 10 MG 24 hr tablet Take 10 mg by mouth daily with breakfast.   . glucosamine-chondroitin 500-400 MG tablet Take 2 tablets by mouth daily.   . mirabegron ER (MYRBETRIQ) 50 MG TB24 tablet Take 50 mg by mouth every morning.  . Multiple Vitamin (MULTIVITAMIN WITH MINERALS) TABS Take 1 tablet by mouth daily.  . SitaGLIPtin-MetFORMIN HCl (JANUMET XR) 50-1000 MG TB24 Take 1 tablet by mouth at bedtime.  . vancomycin IVPB Inject 1,750 mg into the vein every 12 (twelve) hours. Indication:  Osteomyelitis Last Day of Therapy:  08/02/17 Labs - Sunday/Monday:  CBC/D, BMP, and vancomycin trough. Labs - Thursday:  BMP and vancomycin trough Labs - Every other week:  ESR and CRP (Patient not taking: Reported on 07/12/2017)  . VITAMIN A PO Take 5,000 mg by mouth daily.     FAMILY HISTORY:  Her indicated that her mother is alive. She indicated that her father is alive. She indicated that the status of her neg hx is unknown.   SOCIAL HISTORY: She  reports that  has never smoked. she has never used smokeless tobacco. She reports that she drinks alcohol. She reports that she does not use drugs.  REVIEW OF SYSTEMS:   Bolds are positive  Constitutional: weight loss, gain, night sweats, Fevers, chills, fatigue .  HEENT: headaches, Sore throat, sneezing, nasal congestion, post nasal drip, Difficulty swallowing, Tooth/dental problems, visual complaints visual changes, ear ache CV:  chest pain, radiates: ,Orthopnea, PND, swelling in lower extremities, dizziness, palpitations, syncope.  GI  heartburn, indigestion, abdominal pain, nausea, vomiting, diarrhea, change in bowel habits, loss of appetite, bloody stools.  Resp: cough, productive: , hemoptysis, dyspnea, chest pain,  pleuritic.  Skin: rash or itching or icterus GU: dysuria, change in color of urine, urgency or frequency. flank pain, hematuria  MS: joint pain or swelling. decreased range of motion  Psych: change in mood or affect. depression or anxiety.  Neuro: difficulty with speech, weakness, numbness, ataxia    SUBJECTIVE:    VITAL SIGNS: BP (!) 89/51   Pulse 85   Temp 98.6 F (37 C) (Oral)   Resp (!) 23   Ht 5' 4"  (1.626 m)   Wt 127.5 kg (281 lb)   SpO2 98%   BMI 48.23 kg/m   HEMODYNAMICS:    VENTILATOR SETTINGS:    INTAKE / OUTPUT: I/O last 3 completed shifts: In: 1000 [IV Piggyback:1000] Out: -   PHYSICAL EXAMINATION: General:  Obese female in NAD Neuro:  Alert, oriented, non-focal HEENT:  /AT, PERRL, no JVD Cardiovascular:  RRR, no MRG Lungs:  Clear bilateral breath sounds Abdomen:  Soft, non-tender, non-distended Musculoskeletal:  No acute deformity or ROM limitation.  Skin:  Grossly intact. Skin at recent amputation site. Multiple areas of skin injury to fingertips without s/s infection.   LABS:  BMET Recent Labs  Lab 07/12/17 1133 07/12/17 1411 07/13/17 0041  NA 136 133*  --   K 3.9 4.3  --   CL 100* 97*  --   CO2 24 24  --  BUN 11 13  --   CREATININE 1.04* 1.05* 1.33*  GLUCOSE 338* 371*  --     Electrolytes Recent Labs  Lab 07/12/17 1133 07/12/17 1411  CALCIUM 8.9 9.4    CBC Recent Labs  Lab 07/12/17 1133 07/12/17 1411 07/13/17 0041  WBC 4.4 5.3 6.4  HGB 8.1* 8.5* 7.9*  HCT 25.6* 26.3* 24.2*  PLT 132* 131* 89*    Coag's No results for input(s): APTT, INR in the last 168 hours.  Sepsis Markers Recent Labs  Lab 07/12/17 1431 07/12/17 2343 07/13/17 0041  LATICACIDVEN 2.95* 2.46* 2.3*  PROCALCITON  --   --  55.34    ABG No results for input(s): PHART, PCO2ART, PO2ART in the last 168 hours.  Liver Enzymes Recent Labs  Lab 07/12/17 1133 07/12/17 1411  AST 29 30  ALT 30 30  ALKPHOS 96 96  BILITOT 0.5 0.6  ALBUMIN 2.8*  2.8*    Cardiac Enzymes Recent Labs  Lab 07/13/17 0041  TROPONINI 0.05*    Glucose No results for input(s): GLUCAP in the last 168 hours.  Imaging Dg Chest 2 View  Result Date: 07/12/2017 CLINICAL DATA:  Shortness of breath for 2 days, mid and right chest pain. Chills. PICC line placed 8 weeks ago to treat MRSA. EXAM: CHEST - 2 VIEW COMPARISON:  Chest x-rays dated 03/23/2014 and 01/30/2013. FINDINGS: New diffuse reticulonodular opacities throughout both lungs. No pleural effusion or pneumothorax seen. Mild cardiomegaly is stable. Overall cardiomediastinal silhouette is stable. Left-sided PICC line appears appropriately positioned with tip at the level of the mid/lower SVC. No acute or suspicious osseous finding. IMPRESSION: 1. New diffuse reticulonodular opacities throughout both lungs. Differential includes pulmonary edema related to CHF/volume overload and atypical pneumonia. Clinical history raises the additional possibility of septic emboli. Would consider chest CT for further characterization. 2. Left-sided PICC line in place with tip appropriately positioned at the level of the mid/lower SVC. 3. Stable mild cardiomegaly. Electronically Signed   By: Franki Cabot M.D.   On: 07/12/2017 15:15   Ct Angio Chest Pe W And/or Wo Contrast  Result Date: 07/12/2017 CLINICAL DATA:  Chest pain with exertion. Shortness of breath over the last day. Positive D-dimer. PICC line placement for treatment of staph infection. EXAM: CT ANGIOGRAPHY CHEST WITH CONTRAST TECHNIQUE: Multidetector CT imaging of the chest was performed using the standard protocol during bolus administration of intravenous contrast. Multiplanar CT image reconstructions and MIPs were obtained to evaluate the vascular anatomy. CONTRAST:  100 cc Isovue 370 COMPARISON:  Chest radiography same day. FINDINGS: Cardiovascular: Pulmonary arterial opacification is good. No pulmonary emboli. Heart size is normal. There is mild aortic  atherosclerosis and coronary artery calcification. No pericardial fluid. Mediastinum/Nodes: No mass or lymphadenopathy. Lungs/Pleura: No pleural effusion. Widespread patchy bilateral airspace density, upper lobe predominant, worse on the right than the left, most consistent with bronchopneumonia. The differential diagnosis would include acute allergic alveolitis and sepsis/ARDS. Acute edema or acute pulmonary hemorrhage would be less likely given this pattern. Upper Abdomen: Negative Musculoskeletal: Ordinary mild degenerative changes. Review of the MIP images confirms the above findings. IMPRESSION: No pulmonary emboli. Widespread bilateral airspace filling pattern most consistent with widespread bronchopneumonia. Findings could be seen in the setting of sepsis/ARDS or acute allergic alveolitis. Aortic Atherosclerosis (ICD10-I70.0). Electronically Signed   By: Nelson Chimes M.D.   On: 07/12/2017 20:06   Ct Lumbar Spine W Contrast  Result Date: 07/12/2017 CLINICAL DATA:  Possible sepsis.  Back pain. EXAM: CT LUMBAR SPINE WITH CONTRAST  TECHNIQUE: Multidetector CT imaging of the lumbar spine was performed with intravenous contrast administration. CONTRAST:  100 cc Isovue 370 COMPARISON:  05/04/2016 FINDINGS: Segmentation: 5 lumbar type vertebral bodies. Alignment: 3 mm anterolisthesis L4-5.  2 mm anterolisthesis L5-S1. Vertebrae: No sign of fracture. No finding to suggest the presence of osteomyelitis. No other sign of spinal infection. Paraspinal and other soft tissues: Negative Disc levels: Ordinary degenerative changes at L2-3 and above. L3-4: Disc bulge.  Facet hypertrophy.  Mild lateral recess stenosis. L4-5: Advanced bilateral facet arthropathy with 3 mm anterolisthesis. Circumferential bulging of the disc. Severe multifactorial spinal stenosis that could be symptomatic. L5-S1: Advanced bilateral facet arthropathy with 2 mm anterolisthesis. Circumferential bulging of the disc. Stenosis of the lateral recesses  and foramina that could be symptomatic. Bilateral sacroiliac osteoarthritis. IMPRESSION: No sign of spinal infection in the lumbar region. Multifactorial spinal stenosis at L4-5 and L5-S1 that could be a cause of back pain or neural compressive symptoms. Electronically Signed   By: Nelson Chimes M.D.   On: 07/12/2017 20:28   Ct T-spine No Charge  Result Date: 07/12/2017 CLINICAL DATA:  Mid back pain EXAM: CT THORACIC SPINE WITHOUT CONTRAST TECHNIQUE: Multidetector CT images of the thoracic were obtained using the standard protocol without intravenous contrast. COMPARISON:  Chest CT 07/12/2017 FINDINGS: Alignment: Normal. Vertebrae: There is mild multilevel thoracic vertebral bite a generative height loss with anterior osteophyte formation. No fracture. Mild multilevel disc space narrowing. Paraspinal and other soft tissues: Extensive airspace opacities within both lungs, better characterized on concomitant chest CT. Disc levels: Disc osteophyte complex at T6-T7 mildly narrows the spinal canal. No other spinal canal stenosis. Neural foramina are clearly patent. IMPRESSION: 1. No acute abnormality of the thoracic spine. 2. Mild narrowing of the spinal canal by a central disc osteophyte complex at T6-T7. 3. Confluent airspace disease, better characterized on concomitant chest CT. Please see the dedicated report for that study. Electronically Signed   By: Ulyses Jarred M.D.   On: 07/12/2017 20:42     STUDIES:  CTA chest 3/18 > No pulmonary emboli. Widespread bilateral airspace filling pattern most consistent with widespread bronchopneumonia. Findings could be seen in the setting of sepsis/ARDS or acute allergic alveolitis. CT lumbar/thoracic spine 3/18 > no acute issues. Some spinal stenosis.   CULTURES: Blood (PICC) 3/18 > Urine 3/18 > Sputum 3/19 >   ANTIBIOTICS: Vancomycin 2/16 >>> changed to daptomycin due to renal tox. > then changed to doxycyline 3/17.>> Cefepime 3/18 > Zyvox 3/18 > Levaquin  3/18 >  SIGNIFICANT EVENTS: 3/18 Admit, PICC DC  LINES/TUBES:   ASSESSMENT / PLAN:  Septic shock: etiology not entirely clear. Known MRSA osteomyelitis, but has been on ABX with this. With changes on chest CT concern rises for PNA or infectious endocarditis.  Plan: -Continue ABX as above -PICC DC -Cultures pending -Consult ID in AM (she is being followed by ID as outpatient) -Continue phenylephrine IV PRN to keep MAP > 78mHg  Diffuse pulmonary infiltrates: Could certainly represent an atypical PNA, but also potentially a hypersensitivity pneumonitis. Could also represent ALI, however, she is not hypoxic. Dapto is known to cause hypersens reaction and eosinophilic PNA. PCT very high.  Plan: - Supplemental o2 as indicated - Will check HP panel - Defer steroids for now.  - Urine strep and legionella. - Trend PCT  AKI -follow BMP   DM - CBG monitoring and SSI - Lantus  OSA - QHS BiPAP  Thrombocytopenia: likley r/t infection - Follow CBC   PEddie Dibbles  Clyde Lundborg [facility] Pulmonology/Critical Care Pager [phone] or [phone]  07/13/2017 4:29 AM

## 2017-07-13 NOTE — Progress Notes (Addendum)
Inpatient Diabetes Program Recommendations  AACE/ADA: New Consensus Statement on Inpatient Glycemic Control (2015)  Target Ranges:  Prepandial:   less than 140 mg/dL      Peak postprandial:   less than 180 mg/dL (1-2 hours)      Critically ill patients:  140 - 180 mg/dL   Results for [name], [name] (MRN [id number]) as of 07/13/2017 09:34  Ref. Range 07/12/2017 11:33 07/12/2017 14:11 07/13/2017 06:38  Glucose Latest Ref Range: 65 - 99 mg/dL 409338 (H) 811371 (H) 914291 (H)   Review of Glycemic Control  Diabetes history: DM 2 Outpatient Diabetes medications: Glipizide 10 mg Daily, Janumet 50-1000 mg BID Current orders for Inpatient glycemic control: Lantus 10 units, Novolog Sensitive Correction 0-9 units tid  Inpatient Diabetes Program Recommendations:    Glucose elevated still in the high 200 range after Lantus 10 units given. Consider increasing Lantus to 14-16 units (almost 0.15 units/kg).   Thanks,  Christena DeemShannon Vernon Maish RN, MSN, BC-ADM, [facility] Inpatient Diabetes Coordinator Team Pager [phone] (8a-5p)

## 2017-07-13 NOTE — Progress Notes (Signed)
PHARMACY - PHYSICIAN COMMUNICATION CRITICAL VALUE ALERT - BLOOD CULTURE IDENTIFICATION (BCID)  [name] is an 52 y.o. female [facility] with a chief complaint of fevers/chills  Assessment:   2/2 Blood cultures growing Kleb pneumoniae, possible line sepsis  Name of physician (or Provider) Contacted: N/A  Current antibiotics: Zyvox, Cefepime, Levaquin  Changes to prescribed antibiotics recommended:  Consider narrowing therapy if HCAP ruled out.   Teflaro recommended by ID for MRSA osteomyelitis and would also cover Kleb pneumo.    Results for orders placed or performed during the hospital encounter of 07/12/17  Blood Culture ID Panel (Reflexed) (Collected: 07/12/2017  4:21 PM)  Result Value Ref Range   Enterococcus species NOT DETECTED NOT DETECTED   Listeria monocytogenes NOT DETECTED NOT DETECTED   Staphylococcus species NOT DETECTED NOT DETECTED   Staphylococcus aureus NOT DETECTED NOT DETECTED   Streptococcus species NOT DETECTED NOT DETECTED   Streptococcus agalactiae NOT DETECTED NOT DETECTED   Streptococcus pneumoniae NOT DETECTED NOT DETECTED   Streptococcus pyogenes NOT DETECTED NOT DETECTED   Acinetobacter baumannii NOT DETECTED NOT DETECTED   Enterobacteriaceae species DETECTED (A) NOT DETECTED   Enterobacter cloacae complex NOT DETECTED NOT DETECTED   Escherichia coli NOT DETECTED NOT DETECTED   Klebsiella oxytoca NOT DETECTED NOT DETECTED   Klebsiella pneumoniae DETECTED (A) NOT DETECTED   Proteus species NOT DETECTED NOT DETECTED   Serratia marcescens NOT DETECTED NOT DETECTED   Carbapenem resistance NOT DETECTED NOT DETECTED   Haemophilus influenzae NOT DETECTED NOT DETECTED   Neisseria meningitidis NOT DETECTED NOT DETECTED   Pseudomonas aeruginosa NOT DETECTED NOT DETECTED   Candida albicans NOT DETECTED NOT DETECTED   Candida glabrata NOT DETECTED NOT DETECTED   Candida krusei NOT DETECTED NOT DETECTED   Candida  parapsilosis NOT DETECTED NOT DETECTED   Candida tropicalis NOT DETECTED NOT DETECTED    Eddie Candlebbott, Nadea Kirkland Vernon 07/13/2017  4:52 AM

## 2017-07-13 NOTE — ED Notes (Signed)
Pt states "I am feeling better, no sob".  Swelling to face has resolved.  Will continue to monitor.

## 2017-07-13 NOTE — Progress Notes (Signed)
Placed patient on BIPAP with IPAP set at 18cm and EPAP at 10cm with oxygen set at 3lpm.

## 2017-07-13 NOTE — ED Notes (Signed)
Stopped Rocephin 2G IV due to pt experiencing an allergic reaction.  Pt states she feels sob.  Mild swelling to the face noted.  Orvan Falconerampbell MD at the bedside.  Will continue to monitor.

## 2017-07-14 ENCOUNTER — Inpatient Hospital Stay: Payer: BLUE CROSS/BLUE SHIELD | Admitting: Infectious Diseases

## 2017-07-14 DIAGNOSIS — M86141 Other acute osteomyelitis, right hand: Secondary | ICD-10-CM

## 2017-07-14 LAB — LEGIONELLA PNEUMOPHILA SEROGP 1 UR AG: L. pneumophila Serogp 1 Ur Ag: NEGATIVE

## 2017-07-14 LAB — GLUCOSE, CAPILLARY
GLUCOSE-CAPILLARY: 164 mg/dL — AB (ref 65–99)
GLUCOSE-CAPILLARY: 163 mg/dL — AB (ref 65–99)
Glucose-Capillary: 165 mg/dL — ABNORMAL HIGH (ref 65–99)
Glucose-Capillary: 169 mg/dL — ABNORMAL HIGH (ref 65–99)
Glucose-Capillary: 170 mg/dL — ABNORMAL HIGH (ref 65–99)
Glucose-Capillary: 180 mg/dL — ABNORMAL HIGH (ref 65–99)

## 2017-07-14 LAB — URINE CULTURE: Culture: 20000 — AB

## 2017-07-14 LAB — MRSA PCR SCREENING: MRSA by PCR: NEGATIVE

## 2017-07-14 MED ORDER — ENOXAPARIN SODIUM 80 MG/0.8ML ~~LOC~~ SOLN
65.0000 mg | Freq: Every day | SUBCUTANEOUS | Status: DC
  Administered 2017-07-14 – 2017-07-15 (×2): 65 mg via SUBCUTANEOUS
  Filled 2017-07-14: qty 0.8
  Filled 2017-07-14: qty 0.65

## 2017-07-14 MED ORDER — DOXYCYCLINE HYCLATE 100 MG PO TABS
100.0000 mg | ORAL_TABLET | Freq: Two times a day (BID) | ORAL | Status: DC
  Administered 2017-07-14 – 2017-07-15 (×3): 100 mg via ORAL
  Filled 2017-07-14 (×4): qty 1

## 2017-07-14 MED ORDER — ACETAMINOPHEN 325 MG PO TABS
650.0000 mg | ORAL_TABLET | ORAL | Status: DC | PRN
  Administered 2017-07-14: 650 mg via ORAL
  Filled 2017-07-14: qty 2

## 2017-07-14 MED ORDER — INSULIN ASPART 100 UNIT/ML ~~LOC~~ SOLN
2.0000 [IU] | Freq: Three times a day (TID) | SUBCUTANEOUS | Status: DC
  Administered 2017-07-14 – 2017-07-15 (×3): 2 [IU] via SUBCUTANEOUS

## 2017-07-14 NOTE — Progress Notes (Signed)
Patient ID: [name], female   DOB: January 25, 1966, 52 y.o.   MRN: [id number]          [facility]  Date of Admission:  07/12/2017           Day 32 MRSA therapy        Day 2 Klebsiella therapy ASSESSMENT: Ms. 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  She was discharged on IV vancomycin but developed nephrotoxicity and was switched to daptomycin.  She began to have fever and chills associated with her daptomycin infusions.  She was admitted with fever and hypotension.  One blood culture drawn through her PICC line has grown Klebsiella.  It is most probable that her recent fevers and hypotension were due to a PICC line infection.  She is improving with additional antibiotics and PICC line removal.  I believe that her acute shortness of breath and pulmonary infiltrates are most likely due to volume overload rather than hypersensitivity pneumonitis due to daptomycin or pneumonia.  I will switch linezolid to oral doxycycline to complete 11 more days of therapy for MRSA osteomyelitis.  Once antibiotic susceptibilities are available for her Klebsiella blood culture isolate I will change to a narrow or oral antibiotic levofloxacin as possible.  PLAN: 1. Change linezolid to oral doxycycline 2. 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 enoxaparin (LOVENOX) injection  65 mg Subcutaneous Daily  . insulin aspart  0-9 Units Subcutaneous TID WC  . insulin aspart  2 Units Subcutaneous TID WC  . insulin glargine  10 Units Subcutaneous QHS  . levofloxacin  750 mg Oral Daily  . linezolid  600  mg Oral Q12H  . mirabegron ER  50 mg Oral q morning - 10a  . multivitamin with minerals  1 tablet Oral Daily  . vitamin B-12  1,000 mcg Oral Daily   Continuous Infusions: PRN Meds:.acetaminophen **OR** acetaminophen, ondansetron **OR** ondansetron (ZOFRAN) IV   SUBJECTIVE: She is feeling markedly improved today.  Her fever and chills have resolved after PICC removal yesterday.  Her itching and sensation of throat swelling resolved after stopping ceftriaxone yesterday.  She denies any shortness of breath or cough prior to admission.  She says that she became short of breath after receiving IV fluids in the emergency department.  Today, she is not short of breath when sitting up but does become more short of breath when laying flat.  She is off pressors.  Review of Systems: Review of Systems  Constitutional: Negative for chills, diaphoresis and fever.  Respiratory: Positive for shortness of breath. Negative for cough and sputum production.   Cardiovascular: Negative for chest pain.  Gastrointestinal: Negative for abdominal pain, diarrhea, nausea and vomiting.  Genitourinary: Negative for dysuria.  Musculoskeletal: Negative for back pain and joint pain.  Skin: Negative for itching and rash.    Allergies  Allergen Reactions  . Rocephin [Ceftriaxone] Shortness Of Breath and Swelling  . Daptomycin Other (See Comments)    Develops severe chills, lower back pain, and delayed thinking after receiving this (symptoms eventually lessen)  . Vancomycin Other (See Comments)    Drives creatinine level(s) "through  the roof" and they didn't return to normal for approx 14 days    OBJECTIVE: Vitals:   07/14/17 1100 07/14/17 1106 07/14/17 1200 07/14/17 1300  BP: 118/61  (!) 110/43 (!) 112/50  Pulse: 92  90 87  Resp: (!) 26  (!) 25 (!) 28  Temp:  98.5 F (36.9 C)    TempSrc:  Oral    SpO2: 98%  96% 93%  Weight:      Height:       Body mass index is 49.42 kg/m.  Physical Exam    Constitutional: She is oriented to person, place, and time.  She is very pleasant and in good spirits.  She is sitting up in a chair working on her computer.  Cardiovascular: Normal rate and regular rhythm.  No murmur heard. Pulmonary/Chest: Effort normal. She has no wheezes. She has no rales.  Abdominal: Soft. There is no tenderness.  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  Her incision has healed and she has no evidence of ongoing infection.  Neurological: She is alert and oriented to person, place, and time.  Skin: No rash noted.  Her splotchy red rash has resolved.  Psychiatric: Mood and affect normal.    Lab Results Lab Results  Component Value Date   WBC 11.6 (H) 07/13/2017   HGB 8.6 (L) 07/13/2017   HCT 26.8 (L) 07/13/2017   MCV 87.0 07/13/2017   PLT 122 (L) 07/13/2017    Lab Results  Component Value Date   CREATININE 1.14 (H) 07/13/2017   BUN 12 07/13/2017   NA 131 (L) 07/13/2017   K 3.7 07/13/2017   CL 99 (L) 07/13/2017   CO2 20 (L) 07/13/2017    Lab Results  Component Value Date   ALT 28 07/13/2017   AST 33 07/13/2017   ALKPHOS 116 07/13/2017   BILITOT 1.2 07/13/2017     Microbiology: Recent Results (from the past 240 hour(s))  Blood culture (routine single)     Status: None (Preliminary result)   Collection Time: 07/12/17 10:30 AM  Result Value Ref Range Status   Specimen Description BLOOD RIGHT HAND  Final   Special Requests   Final    BOTTLES DRAWN AEROBIC AND ANAEROBIC Blood Culture results may not be optimal due to an inadequate volume of blood received in culture bottles   Culture   Final    NO GROWTH < 24 HOURS Performed at [facility], [address], [place], [place] [address]    Report Status PENDING  Incomplete  Blood culture (routine single)     Status: None (Preliminary result)   Collection Time: 07/12/17 10:40 AM  Result Value Ref Range Status   Specimen Description BLOOD LEFT HAND   Final   Special Requests   Final    BOTTLES DRAWN AEROBIC AND ANAEROBIC Blood Culture results may not be optimal due to an inadequate volume of blood received in culture bottles   Culture   Final    NO GROWTH < 24 HOURS Performed at [facility], [address], [place], [place] [address]    Report Status PENDING  Incomplete  Culture, blood (single)     Status: Abnormal (Preliminary result)   Collection Time: 07/12/17  4:21 PM  Result Value Ref Range Status   Specimen Description BLOOD PICC LINE  Final   Special Requests   Final    BOTTLES DRAWN AEROBIC AND ANAEROBIC Blood Culture adequate volume  Culture  Setup Time   Final    GRAM NEGATIVE RODS IN BOTH AEROBIC AND ANAEROBIC BOTTLES CRITICAL RESULT CALLED TO, READ BACK BY AND VERIFIED WITH: G ABOTT PHARMD 07/13/17 0444 JDW Performed at [facility], [address], [place], [place] [address]    Culture KLEBSIELLA PNEUMONIAE (A)  Final   Report Status PENDING  Incomplete  Blood Culture ID Panel (Reflexed)     Status: Abnormal   Collection Time: 07/12/17  4:21 PM  Result Value Ref Range Status   Enterococcus species NOT DETECTED NOT DETECTED Final   Listeria monocytogenes NOT DETECTED NOT DETECTED Final   Staphylococcus species NOT DETECTED NOT DETECTED Final   Staphylococcus aureus NOT DETECTED NOT DETECTED Final   Streptococcus species NOT DETECTED NOT DETECTED Final   Streptococcus agalactiae NOT DETECTED NOT DETECTED Final   Streptococcus pneumoniae NOT DETECTED NOT DETECTED Final   Streptococcus pyogenes NOT DETECTED NOT DETECTED Final   Acinetobacter baumannii NOT DETECTED NOT DETECTED Final   Enterobacteriaceae species DETECTED (A) NOT DETECTED Final    Comment: Enterobacteriaceae represent a large family of gram-negative bacteria, not a single organism. CRITICAL RESULT CALLED TO, READ BACK BY AND VERIFIED WITH: G ABOTT PHARMD 07/13/17 0444 JDW    Enterobacter cloacae complex NOT DETECTED NOT DETECTED Final    Escherichia coli NOT DETECTED NOT DETECTED Final   Klebsiella oxytoca NOT DETECTED NOT DETECTED Final   Klebsiella pneumoniae DETECTED (A) NOT DETECTED Final    Comment: CRITICAL RESULT CALLED TO, READ BACK BY AND VERIFIED WITH: G ABOTT PHARMD 07/13/17 0444 JDW    Proteus species NOT DETECTED NOT DETECTED Final   Serratia marcescens NOT DETECTED NOT DETECTED Final   Carbapenem resistance NOT DETECTED NOT DETECTED Final   Haemophilus influenzae NOT DETECTED NOT DETECTED Final   Neisseria meningitidis NOT DETECTED NOT DETECTED Final   Pseudomonas aeruginosa NOT DETECTED NOT DETECTED Final   Candida albicans NOT DETECTED NOT DETECTED Final   Candida glabrata NOT DETECTED NOT DETECTED Final   Candida krusei NOT DETECTED NOT DETECTED Final   Candida parapsilosis NOT DETECTED NOT DETECTED Final   Candida tropicalis NOT DETECTED NOT DETECTED Final    Comment: Performed at [facility], [address] [place], [place] [address]  Urine culture     Status: Abnormal   Collection Time: 07/12/17  4:23 PM  Result Value Ref Range Status   Specimen Description URINE, CLEAN CATCH  Final   Special Requests   Final    NONE Performed at [facility], [address] [place], [place] [address]    Culture 20,000 COLONIES/mL KLEBSIELLA PNEUMONIAE (A)  Final   Report Status 07/14/2017 FINAL  Final   Organism ID, Bacteria KLEBSIELLA PNEUMONIAE (A)  Final      Susceptibility   Klebsiella pneumoniae - MIC*    AMPICILLIN RESISTANT Resistant     CEFAZOLIN <=4 SENSITIVE Sensitive     CEFTRIAXONE <=1 SENSITIVE Sensitive     CIPROFLOXACIN <=0.25 SENSITIVE Sensitive     GENTAMICIN <=1 SENSITIVE Sensitive     IMIPENEM <=0.25 SENSITIVE Sensitive     NITROFURANTOIN 64 INTERMEDIATE Intermediate     TRIMETH/SULFA <=20 SENSITIVE Sensitive     AMPICILLIN/SULBACTAM 4 SENSITIVE Sensitive     PIP/TAZO <=4 SENSITIVE Sensitive     Extended ESBL NEGATIVE Sensitive     * 20,000 COLONIES/mL KLEBSIELLA  PNEUMONIAE  MRSA PCR Screening     Status: None   Collection Time: 07/14/17  9:20 AM  Result Value Ref Range Status   MRSA by PCR NEGATIVE NEGATIVE Final    Comment:        The GeneXpert MRSA Assay (FDA approved for NASAL specimens only), is one component of a comprehensive MRSA colonization surveillance program. It is not intended to diagnose MRSA infection nor to guide or monitor treatment for MRSA infections. Performed at [facility], [address] [place], [place] [address]     Cliffton AstersJohn Habiba Treloar, MD [facility] [phone] pager   [phone] cell 07/14/2017, 1:34 PM

## 2017-07-14 NOTE — Progress Notes (Signed)
PULMONARY / CRITICAL CARE MEDICINE   Name: [name] MRN: [id number] DOB: 02/25/1966    ADMISSION DATE:  07/12/2017 CONSULTATION DATE: 07/13/2017  REFERRING MD: Dr. Toniann FailKakrakandy  CHIEF COMPLAINT: Septic shock  HISTORY OF PRESENT ILLNESS:   52 year old female with past medical history as below which is significant for diabetes mellitus, peripheral neuropathy, obesity, obstructive sleep apnea, and osteomyelitis of the right middle finger.  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  She was treated with antibiotics and was ultimately discharged on vancomycin via PICC line.  Initially she tolerated this well, however, eventually her renal function began to deteriorate and she was transitioned to daptomycin.  She took this for about a week before developing fevers/chills consistently about an hour after taking it.  She contacted the infectious disease specialists and was changed to oral doxycycline and was instructed to present to the emergency department if she did not feel better.  She has also developed some pleuritic chest pain.  She presented to the ED 3/18.  In the ED she was noted to be tachycardic and febrile to 103 F.  She was also hypotensive which initially improved with IV fluids.  Given her pleuritic chest pain she went for CT angiogram of the chest to rule out PE.  No embolus was identified, however, diffuse bilateral airspace disease was noted and was concerning for pneumonia.  She was started on broad-spectrum antibiotics including atypical coverage for pneumonia.  PICC line was removed as a potential source for infection.  Despite the aforementioned therapies, she remained hypotensive and was started on phenylephrine for blood   SUBJECTIVE:  Feels much better  VITAL SIGNS: BP (!) 89/59   Pulse 91   Temp 98.5 F (36.9 C) (Oral)   Resp 20   Ht 5\' 4"  (1.626 m)   Wt 287 lb 14.7 oz (130.6 kg)    SpO2 100%   BMI 49.42 kg/m   HEMODYNAMICS:    VENTILATOR SETTINGS:    INTAKE / OUTPUT: I/O last 3 completed shifts: In: 5565.5 [P.O.:240; I.V.:1125.5; IV Piggyback:4200] Out: 1500 [Urine:1500]  PHYSICAL EXAMINATION: General: 52 year old white female currently resting comfortably in bed she is in no acute distress HEENT: Normocephalic atraumatic no jugular venous distention mucous membranes are moist Pulmonary: Clear to auscultation no accessory use Cardiac: Regular rate and rhythm Abdomen: Soft nontender no organomegaly Extremity: Does have lower extremity pitting edema, otherwise intact Neuro: Awake alert oriented no focal deficits.   LABS:  BMET Recent Labs  Lab 07/12/17 1133 07/12/17 1411 07/13/17 0041 07/13/17 0638  NA 136 133*  --  131*  K 3.9 4.3  --  3.7  CL 100* 97*  --  99*  CO2 24 24  --  20*  BUN 11 13  --  12  CREATININE 1.04* 1.05* 1.33* 1.14*  GLUCOSE 338* 371*  --  291*    Electrolytes Recent Labs  Lab 07/12/17 1133 07/12/17 1411 07/13/17 0638  CALCIUM 8.9 9.4 8.2*    CBC Recent Labs  Lab 07/12/17 1411 07/13/17 0041 07/13/17 0638  WBC 5.3 6.4 11.6*  HGB 8.5* 7.9* 8.6*  HCT 26.3* 24.2* 26.8*  PLT 131* 89* 122*    Coag's No results for input(s): APTT, INR in the last 168 hours.  Sepsis Markers Recent Labs  Lab 07/12/17 2343 07/13/17 0041 07/13/17 0302  LATICACIDVEN 2.46* 2.3* 2.5*  PROCALCITON  --  55.34  --     ABG No  results for input(s): PHART, PCO2ART, PO2ART in the last 168 hours.  Liver Enzymes Recent Labs  Lab 07/12/17 1133 07/12/17 1411 07/13/17 0638  AST 29 30 33  ALT 30 30 28   ALKPHOS 96 96 116  BILITOT 0.5 0.6 1.2  ALBUMIN 2.8* 2.8* 2.3*    Cardiac Enzymes Recent Labs  Lab 07/13/17 0041 07/13/17 0638 07/13/17 1910  TROPONINI 0.05* 0.04* 0.05*    Glucose Recent Labs  Lab 07/13/17 0854 07/13/17 1247 07/13/17 1843 07/13/17 2348 07/14/17 0509 07/14/17 0814  GLUCAP 269* 240* 193* 180* 164*  163*    Imaging No results found.   STUDIES:  CTA chest 3/18 > No pulmonary emboli. Widespread bilateral airspace filling pattern most consistent with widespread bronchopneumonia. Findings could be seen in the setting of sepsis/ARDS or acute allergic alveolitis. CT lumbar/thoracic spine 3/18 > no acute issues. Some spinal stenosis.   CULTURES: Blood (PICC) 3/18 > Urine 3/18 > Sputum 3/19 >   ANTIBIOTICS: Vancomycin 2/16 >>> changed to daptomycin due to renal tox. > then changed to doxycyline 3/17.>> Cefepime 3/18 > Zyvox 3/18 > Levaquin 3/18 >  SIGNIFICANT EVENTS: 3/18 Admit, PICC DC  LINES/TUBES:   ASSESSMENT / PLAN:  Septic shock w/ Kleb. Pneumoniae bacteremia  Presumed line infection. PICC line out since 3/19  Known MRSA osteomyelitis, but has been on ABX with this. With changes on chest CT concern rises for PNA or infectious endocarditis.  Plan Day #2 Levaquin and oral and linazolid as directed by ID (holding cymbalta)  Weaning neo for SBP > 90 Trend PCT  Defer further eval per ID   Diffuse pulmonary infiltrates: Could certainly represent an atypical PNA, but also potentially a hypersensitivity pneumonitis (dapto related?),  ALI, however, she is not hypoxic. Also consider pulmonary edema (has h/o grade II diastolic dysfxn). But this seems unlikely in setting of hypotension Plan  F/u echo  AKI-->improved Plan Renal dose meds Cont gentle hydration F/u am chemistry   DM w/ hyperglycemia  Plan ssi and lantus  OSA Plan - QHS BiPAP  Thrombocytopenia: likley r/t infection Plan Trend cbc  DVT prophylaxis: LMWH SUP: na  Diet: reg Activity: oob Disposition : ICU possibly stepdown  My cct 40 min     Simonne Martinet ACNP-BC [facility] Pulmonary/Critical Care Pager # [phone] OR # [phone] if no answer

## 2017-07-15 ENCOUNTER — Inpatient Hospital Stay (HOSPITAL_COMMUNITY): Payer: BLUE CROSS/BLUE SHIELD

## 2017-07-15 DIAGNOSIS — Z8614 Personal history of Methicillin resistant Staphylococcus aureus infection: Secondary | ICD-10-CM

## 2017-07-15 DIAGNOSIS — R609 Edema, unspecified: Secondary | ICD-10-CM

## 2017-07-15 LAB — CULTURE, BLOOD (SINGLE): SPECIAL REQUESTS: ADEQUATE

## 2017-07-15 LAB — COMPREHENSIVE METABOLIC PANEL
ALBUMIN: 2.5 g/dL — AB (ref 3.5–5.0)
ALK PHOS: 89 U/L (ref 38–126)
ALT: 26 U/L (ref 14–54)
ANION GAP: 8 (ref 5–15)
AST: 22 U/L (ref 15–41)
BILIRUBIN TOTAL: 0.7 mg/dL (ref 0.3–1.2)
BUN: 8 mg/dL (ref 6–20)
CO2: 26 mmol/L (ref 22–32)
Calcium: 9.4 mg/dL (ref 8.9–10.3)
Chloride: 104 mmol/L (ref 101–111)
Creatinine, Ser: 0.95 mg/dL (ref 0.44–1.00)
GFR calc Af Amer: >60 mL/min (ref >60)
GFR calc non Af Amer: >60 mL/min (ref >60)
GLUCOSE: 199 mg/dL — AB (ref 65–99)
POTASSIUM: 3.7 mmol/L (ref 3.5–5.1)
SODIUM: 138 mmol/L (ref 135–145)
TOTAL PROTEIN: 6 g/dL — AB (ref 6.5–8.1)

## 2017-07-15 LAB — CBC
HCT: 27.1 % — ABNORMAL LOW (ref 36.0–46.0)
HEMOGLOBIN: 8.6 g/dL — AB (ref 12.0–15.0)
MCH: 28.3 pg (ref 26.0–34.0)
MCHC: 31.7 g/dL (ref 30.0–36.0)
MCV: 89.1 fL (ref 78.0–100.0)
Platelets: 212 10*3/uL (ref 150–400)
RBC: 3.04 MIL/uL — ABNORMAL LOW (ref 3.87–5.11)
RDW: 15.7 % — ABNORMAL HIGH (ref 11.5–15.5)
WBC: 4.3 10*3/uL (ref 4.0–10.5)

## 2017-07-15 LAB — GLUCOSE, CAPILLARY
Glucose-Capillary: 175 mg/dL — ABNORMAL HIGH (ref 65–99)
Glucose-Capillary: 174 mg/dL — ABNORMAL HIGH (ref 65–99)

## 2017-07-15 MED ORDER — SULFAMETHOXAZOLE-TRIMETHOPRIM 800-160 MG PO TABS: 1.0000 | ORAL_TABLET | Freq: Two times a day (BID) | ORAL | Status: DC

## 2017-07-15 MED ORDER — SULFAMETHOXAZOLE-TRIMETHOPRIM 800-160 MG PO TABS: 1.0000 | ORAL_TABLET | Freq: Two times a day (BID) | ORAL | 0 refills | Status: AC

## 2017-07-15 MED ORDER — DOXYCYCLINE HYCLATE 100 MG PO TABS: 100.0000 mg | ORAL_TABLET | Freq: Two times a day (BID) | ORAL | 0 refills | Status: AC

## 2017-07-15 NOTE — Progress Notes (Signed)
Patient discharged to home with instructions. 

## 2017-07-15 NOTE — Progress Notes (Signed)
LUE venous duplex prelim: negative for DVT and SVT. Noal Abshier Eunice, RDMS, RVT   

## 2017-07-15 NOTE — Progress Notes (Signed)
Patient ID: [name], female   DOB: 02/15/1966, 52 y.o.   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  She was discharged on IV vancomycin but developed nephrotoxicity and was switched to daptomycin.  She began to have fever and chills associated with her daptomycin infusions.  She was admitted with fever and hypotension.  One blood culture drawn through her PICC line has grown Klebsiella.  It is most probable that her recent fevers and hypotension were due to a PICC line infection.  She is improving with additional antibiotics and PICC line removal.  I believe that her acute shortness of breath and pulmonary infiltrates are most likely due to volume overload rather than hypersensitivity pneumonitis due to daptomycin or pneumonia.  I will give her 9 more days of doxycycline to complete 6 weeks of total therapy for her MRSA osteomyelitis.  I will switch levofloxacin to oral trimethoprim sulfamethoxazole and give her 5 more days of therapy for Klebsiella PICC infection.  PLAN: 1. Continue oral doxycycline for 9 more days 2. Change levofloxacin to trimethoprim sulfamethoxazole 1 double strength tablet twice daily for 5 more days 3. 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 doxycycline  100 mg Oral Q12H  . enoxaparin (LOVENOX) injection  65 mg Subcutaneous Daily  . insulin aspart  0-9 Units Subcutaneous TID WC  . insulin aspart  2 Units Subcutaneous TID WC  . insulin glargine  10  Units Subcutaneous QHS  . levofloxacin  750 mg Oral Daily  . mirabegron ER  50 mg Oral q morning - 10a  . multivitamin with minerals  1 tablet Oral Daily  . vitamin B-12  1,000 mcg Oral Daily   Continuous Infusions: PRN Meds:.acetaminophen, ondansetron **OR** ondansetron (ZOFRAN) IV   SUBJECTIVE: She is feeling much better today.  Her orthopnea has resolved and she is not having any shortness of breath.  She has been up walking in her room without any difficulty.  Review of Systems: Review of Systems  Constitutional: Negative for chills, diaphoresis and fever.  Respiratory: Negative for cough, sputum production and shortness of breath.   Cardiovascular: Negative for chest pain.  Gastrointestinal: Negative for abdominal pain, diarrhea, nausea and vomiting.  Genitourinary: Negative for dysuria.  Musculoskeletal: Negative for back pain and joint pain.  Skin: Negative for itching and rash.    Allergies  Allergen Reactions  . Rocephin [Ceftriaxone] Shortness Of Breath and Swelling  . Daptomycin Other (See Comments)    Develops severe chills, lower back pain, and delayed thinking after receiving this (symptoms eventually lessen)  . Vancomycin Other (See Comments)    Drives creatinine level(s) "through the roof" and they didn't return to normal for approx 14 days    OBJECTIVE: Vitals:   07/14/17 1754 07/14/17 2055 07/14/17 2135 07/15/17 0440  BP: (!) 116/41 114/61  115/60  Pulse: 87 93 91 92  Resp:  20 16 16 18   Temp: 98.2 F (36.8 C) 98.7 F (37.1 C)  98.4 F (36.9 C)  TempSrc: Axillary Oral  Oral  SpO2: 99% 99% 98% 100%  Weight:      Height: 5\' 4"  (1.626 m)      Body mass index is 49.42 kg/m.  Physical Exam  Constitutional: She is oriented to person, place, and time.  She is alert and pleasant sitting up in bed.  Cardiovascular: Normal rate and regular rhythm.  No murmur heard. Pulmonary/Chest: Effort normal. She has no wheezes. She has no rales.  Abdominal: Soft.  There is no tenderness.  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  Her incision has healed and she has no evidence of ongoing infection.  Neurological: She is alert and oriented to person, place, and time.  Skin: No rash noted.  Her splotchy red rash has resolved.  Psychiatric: Mood and affect normal.    Lab Results Lab Results  Component Value Date   WBC 4.3 07/15/2017   HGB 8.6 (L) 07/15/2017   HCT 27.1 (L) 07/15/2017   MCV 89.1 07/15/2017   PLT 212 07/15/2017    Lab Results  Component Value Date   CREATININE 0.95 07/15/2017   BUN 8 07/15/2017   NA 138 07/15/2017   K 3.7 07/15/2017   CL 104 07/15/2017   CO2 26 07/15/2017    Lab Results  Component Value Date   ALT 26 07/15/2017   AST 22 07/15/2017   ALKPHOS 89 07/15/2017   BILITOT 0.7 07/15/2017     Microbiology: Recent Results (from the past 240 hour(s))  Blood culture (routine single)     Status: None (Preliminary result)   Collection Time: 07/12/17 10:30 AM  Result Value Ref Range Status   Specimen Description BLOOD RIGHT HAND  Final   Special Requests   Final    BOTTLES DRAWN AEROBIC AND ANAEROBIC Blood Culture results may not be optimal due to an inadequate volume of blood received in culture bottles   Culture   Final    NO GROWTH 2 DAYS Performed at [facility], [address] [place], [place] [address]    Report Status PENDING  Incomplete  Blood culture (routine single)     Status: None (Preliminary result)   Collection Time: 07/12/17 10:40 AM  Result Value Ref Range Status   Specimen Description BLOOD LEFT HAND  Final   Special Requests   Final    BOTTLES DRAWN AEROBIC AND ANAEROBIC Blood Culture results may not be optimal due to an inadequate volume of blood received in culture bottles   Culture   Final    NO GROWTH 2 DAYS Performed at [facility], [address] [place], [place] [address]    Report Status PENDING  Incomplete  Culture, blood  (single)     Status: Abnormal   Collection Time: 07/12/17  4:21 PM  Result Value Ref Range Status   Specimen Description BLOOD PICC LINE  Final   Special Requests   Final    BOTTLES DRAWN AEROBIC AND ANAEROBIC Blood Culture adequate volume   Culture  Setup Time   Final    GRAM NEGATIVE RODS IN BOTH AEROBIC AND ANAEROBIC BOTTLES CRITICAL RESULT CALLED TO, READ BACK BY AND VERIFIED WITH: G ABOTT PHARMD 07/13/17 0444 JDW Performed at [facility], [address] [place], [place] [address]    Culture KLEBSIELLA PNEUMONIAE (A)  Final  Report Status 07/15/2017 FINAL  Final   Organism ID, Bacteria KLEBSIELLA PNEUMONIAE  Final      Susceptibility   Klebsiella pneumoniae - MIC*    AMPICILLIN RESISTANT Resistant     CEFAZOLIN <=4 SENSITIVE Sensitive     CEFEPIME <=1 SENSITIVE Sensitive     CEFTAZIDIME <=1 SENSITIVE Sensitive     CEFTRIAXONE <=1 SENSITIVE Sensitive     CIPROFLOXACIN <=0.25 SENSITIVE Sensitive     GENTAMICIN <=1 SENSITIVE Sensitive     IMIPENEM <=0.25 SENSITIVE Sensitive     TRIMETH/SULFA <=20 SENSITIVE Sensitive     AMPICILLIN/SULBACTAM 4 SENSITIVE Sensitive     PIP/TAZO <=4 SENSITIVE Sensitive     Extended ESBL NEGATIVE Sensitive     * KLEBSIELLA PNEUMONIAE  Blood Culture ID Panel (Reflexed)     Status: Abnormal   Collection Time: 07/12/17  4:21 PM  Result Value Ref Range Status   Enterococcus species NOT DETECTED NOT DETECTED Final   Listeria monocytogenes NOT DETECTED NOT DETECTED Final   Staphylococcus species NOT DETECTED NOT DETECTED Final   Staphylococcus aureus NOT DETECTED NOT DETECTED Final   Streptococcus species NOT DETECTED NOT DETECTED Final   Streptococcus agalactiae NOT DETECTED NOT DETECTED Final   Streptococcus pneumoniae NOT DETECTED NOT DETECTED Final   Streptococcus pyogenes NOT DETECTED NOT DETECTED Final   Acinetobacter baumannii NOT DETECTED NOT DETECTED Final   Enterobacteriaceae species DETECTED (A) NOT DETECTED Final    Comment:  Enterobacteriaceae represent a large family of gram-negative bacteria, not a single organism. CRITICAL RESULT CALLED TO, READ BACK BY AND VERIFIED WITH: G ABOTT PHARMD 07/13/17 0444 JDW    Enterobacter cloacae complex NOT DETECTED NOT DETECTED Final   Escherichia coli NOT DETECTED NOT DETECTED Final   Klebsiella oxytoca NOT DETECTED NOT DETECTED Final   Klebsiella pneumoniae DETECTED (A) NOT DETECTED Final    Comment: CRITICAL RESULT CALLED TO, READ BACK BY AND VERIFIED WITH: G ABOTT PHARMD 07/13/17 0444 JDW    Proteus species NOT DETECTED NOT DETECTED Final   Serratia marcescens NOT DETECTED NOT DETECTED Final   Carbapenem resistance NOT DETECTED NOT DETECTED Final   Haemophilus influenzae NOT DETECTED NOT DETECTED Final   Neisseria meningitidis NOT DETECTED NOT DETECTED Final   Pseudomonas aeruginosa NOT DETECTED NOT DETECTED Final   Candida albicans NOT DETECTED NOT DETECTED Final   Candida glabrata NOT DETECTED NOT DETECTED Final   Candida krusei NOT DETECTED NOT DETECTED Final   Candida parapsilosis NOT DETECTED NOT DETECTED Final   Candida tropicalis NOT DETECTED NOT DETECTED Final    Comment: Performed at [facility], [address], [place], [place] [address]  Urine culture     Status: Abnormal   Collection Time: 07/12/17  4:23 PM  Result Value Ref Range Status   Specimen Description URINE, CLEAN CATCH  Final   Special Requests   Final    NONE Performed at [facility], [address] [place], [place] [address]    Culture 20,000 COLONIES/mL KLEBSIELLA PNEUMONIAE (A)  Final   Report Status 07/14/2017 FINAL  Final   Organism ID, Bacteria KLEBSIELLA PNEUMONIAE (A)  Final      Susceptibility   Klebsiella pneumoniae - MIC*    AMPICILLIN RESISTANT Resistant     CEFAZOLIN <=4 SENSITIVE Sensitive     CEFTRIAXONE <=1 SENSITIVE Sensitive     CIPROFLOXACIN <=0.25 SENSITIVE Sensitive     GENTAMICIN <=1 SENSITIVE Sensitive     IMIPENEM <=0.25 SENSITIVE Sensitive  NITROFURANTOIN 64 INTERMEDIATE Intermediate     TRIMETH/SULFA <=20 SENSITIVE Sensitive     AMPICILLIN/SULBACTAM 4 SENSITIVE Sensitive     PIP/TAZO <=4 SENSITIVE Sensitive     Extended ESBL NEGATIVE Sensitive     * 20,000 COLONIES/mL KLEBSIELLA PNEUMONIAE  MRSA PCR Screening     Status: None   Collection Time: 07/14/17  9:20 AM  Result Value Ref Range Status   MRSA by PCR NEGATIVE NEGATIVE Final    Comment:        The GeneXpert MRSA Assay (FDA approved for NASAL specimens only), is one component of a comprehensive MRSA colonization surveillance program. It is not intended to diagnose MRSA infection nor to guide or monitor treatment for MRSA infections. Performed at [facility], [address] [place], [place] [address]     Cliffton Asters, MD [facility] [phone] pager   [phone] cell 07/15/2017, 11:01 AM

## 2017-07-16 NOTE — Discharge Summary (Addendum)
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 Is follow-up with PCP in 1 week  Follow-up Information    [name], MD. Schedule an appointment as soon as possible for a visit in 1 week(s).   Specialty:  Family Medicine Contact information: [address] [place] [address] [phone]          Diet recommendation: Carb modified diet  Activity: The patient is advised to gradually reintroduce usual activities.  Discharge Condition: good  Code Status: full code  History of present illness: As per the H and P dictated on admission, "[name] is a 52 y.o. 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  Patient eventually developed renal failure from vancomycin and was switched to daptomycin.  Patient states she did fine on daptomycin for the first week but since the second week patient started developing fever chills whenever she takes a daptomycin.  She had called her infectious disease consultant Dr. Algis Liming 1 day ago and was advised to stop daptomycin infusion and doxycycline was called and was advised to come to the clinic the following day that was today.  In the office patient had blood cultures  drawn. In addition patient also was complaining of pleuritic chest pain over the last 24 hours and has been having productive cough for last 2 days and EKG was done in the office which showed some T wave changes and was advised to come to the hospital for admission.  For the last 2 days patient also has been increasing low back pain."  Hospital Course:  Summary of her active problems in the hospital is as following. 1. Severe sepsis From Klebsiella UTI and bacteremia. HCAP  Seen on CT chest  ?  PICC line infection. PICC line has been removed. Patient has been placed on Zyvox and cefepime and also on Levaquin. ID was consulted. Antibiotic changed to Bactrim based on sensitivity on discharge.  2. Uncontrolled diabetes mellitus type 2  Continue home regimen on discharge  3. Acute renal failure  likely from recent use of vancomycin. Renal function significantly better.  4. Thrombocytopenia, anemia of chronic illness. Low platelets from sepsis. Significantly better with resolution of sepsis but Monitor  5. History of Charcot's foot with neuropathy patient wears braces. 6. Sleep apnea on BiPAP. 7. Recent admission for osteomyelitis of the right hand middle finger which was partially amputated.  Patient's left hand also has some abrasion on the fingertips which does not look infected. Was on IV antibiotics.  Patient will be on oral doxycycline on discharge for total of 9 days beyond discharge today.    All other chronic medical condition were stable during the hospitalization.  Patient was ambulatory without any assistance. On the day of the discharge  the patient's vitals were stable , and no other acute medical condition were reported by patient. the patient was felt safe to be discharge at home with family.  Procedures and Results:  none   Consultations:  PCCM   ID  DISCHARGE MEDICATION: Allergies as of 07/15/2017      Reactions   Rocephin [ceftriaxone] Shortness Of  Breath, Swelling   Daptomycin Other (See Comments)   Develops severe chills, lower back pain, and delayed thinking after receiving this (symptoms eventually lessen)   Vancomycin Other (See Comments)   Drives creatinine level(s) "through the roof" and they didn't return to normal for approx 14 days      Medication List    STOP taking these medications   DAPTOmycin in sodium chloride 0.9 % 100 mL   vancomycin IVPB   VITAMIN A PO     TAKE these medications   B-12 1000 MCG Tabs Take 1,000 mcg by mouth daily.   diclofenac 75 MG EC tablet Commonly known as:  VOLTAREN Take 75 mg by mouth 2 (two) times daily.   doxycycline 100 MG tablet Commonly known as:  VIBRA-TABS Take 1 tablet (100 mg total) by mouth every 12 (twelve) hours for 9 days. What changed:  when to take this   DULoxetine 30 MG capsule Commonly known as:  CYMBALTA Take 90 mg by mouth daily.   Exenatide ER 2 MG Pen Inject 2 mg into the skin every Tuesday.   glipiZIDE 10 MG 24 hr tablet Commonly known as:  GLUCOTROL XL Take 10 mg by mouth daily with breakfast.   glucosamine-chondroitin 500-400 MG tablet Take 2 tablets by mouth daily.   HYDROcodone-acetaminophen 10-325 MG tablet Commonly known as:  NORCO Take 1 tablet by mouth every 6 (six) hours as needed for moderate pain.   JANUMET XR 50-1000 MG Tb24 Generic drug:  SitaGLIPtin-MetFORMIN HCl Take 1 tablet by mouth at bedtime.   mirabegron ER 50 MG Tb24 tablet Commonly known as:  MYRBETRIQ Take 50 mg by mouth every morning.   multivitamin with minerals Tabs tablet Take 1 tablet by mouth daily.   mupirocin ointment 2 % Commonly known as:  BACTROBAN Apply 1 application to the affected fingertip on left hand one to three times a day   ondansetron 8 MG disintegrating tablet Commonly known as:  ZOFRAN-ODT Take 8 mg by mouth every 8 (eight) hours as needed for nausea/vomiting. DISSOLVE IN THE MOUTH   oxyCODONE 5 MG immediate release tablet Commonly  known as:  Oxy IR/ROXICODONE Take 1-2 tablets (5-10 mg total) by mouth every 6 (six) hours as needed for severe pain or breakthrough pain.   sulfamethoxazole-trimethoprim 800-160 MG tablet Commonly known as:  BACTRIM DS,SEPTRA DS Take 1 tablet by mouth every 12 (twelve) hours for 5 days.      Allergies  Allergen Reactions  . Rocephin [Ceftriaxone] Shortness Of Breath and Swelling  . Daptomycin Other (See Comments)    Develops severe chills, lower back pain, and delayed thinking after receiving this (symptoms eventually lessen)  . Vancomycin Other (See Comments)    Drives creatinine level(s) "through the roof" and they didn't return to normal for approx 14 days   Discharge Instructions    Diet - low sodium heart healthy   Complete by:  As directed    Increase activity slowly   Complete by:  As directed      Discharge Exam: Filed Weights   07/12/17 1407 07/13/17 1816  Weight: 127.5 kg (281 lb) 130.6 kg (287  lb 14.7 oz)   Vitals:   07/14/17 2135 07/15/17 0440  BP:  115/60  Pulse: 91 92  Resp: 16 18  Temp:  98.4 F (36.9 C)  SpO2: 98% 100%   General: Appear in no distress, no Rash; Oral Mucosa moist. Cardiovascular: S1 and S2 Present, no Murmur, no JVD Respiratory: Bilateral Air entry present and Clear to Auscultation, no Crackles, no wheezes Abdomen: Bowel Sound present, Soft and no tenderness Extremities: no Pedal edema, no calf tenderness Neurology: Grossly no focal neuro deficit.  The results of significant diagnostics from this hospitalization (including imaging, microbiology, ancillary and laboratory) are listed below for reference.    Significant Diagnostic Studies: Ct Abdomen Pelvis Wo Contrast  Result Date: 07/13/2017 CLINICAL DATA:  Abdominal pain and fever. EXAM: CT ABDOMEN AND PELVIS WITHOUT CONTRAST TECHNIQUE: Multidetector CT imaging of the abdomen and pelvis was performed following the standard protocol without IV contrast. COMPARISON:  Lumbar spine and  chest CT yesterday. Abdominal CT 05/04/2016 FINDINGS: Lower chest: Bilateral airspace disease as seen on prior chest CT. Mild lower lobe septal thickening. No pleural fluid. Hepatobiliary: Marked hepatomegaly with liver spanning 29 cm cranial caudal. Diffusely decreased density consistent with steatosis. High-density material in the gallbladder may be vicarious excretion of contrast from prior chest CT, stones or sludge. Pancreas: Fatty atrophy.  No ductal dilatation or inflammation. Spleen: Enlarged spanning 16 cm cranial caudal. Adrenals/Urinary Tract: Normal adrenal glands. Excreted IV contrast within both renal collecting systems from prior CT. Mild left perinephric edema. No hydronephrosis. Excreted IV contrast within the urinary bladder. No bladder wall thickening. Stomach/Bowel: Tiny hiatal hernia. Stomach physiologically distended. No bowel inflammation, wall thickening or obstruction. Normal appendix. Administered enteric contrast reaches the colon. Vascular/Lymphatic: Normal caliber abdominal aorta and intra-abdominal vessels. Prominent porta hepatis nodes likely reactive. Reproductive: Probable anterior uterine fibroids. Ovaries are symmetric in size. Other: No ascites or free air. No intra-abdominal abscess. There is body wall edema. Tiny fat containing umbilical hernia. Musculoskeletal: There are no acute or suspicious osseous abnormalities. Lumbar spine assessed on lumbar spine CT yesterday. IMPRESSION: 1. Mild left perinephric edema is nonspecific, but may represent pyelonephritis. 2. Severe hepatomegaly and hepatic steatosis.  Splenomegaly. 3. Probable uterine fibroids. 4. High-density material in the gallbladder may be vicarious excretion of IV contrast, stones or sludge. No evidence of gallbladder inflammation. Electronically Signed   By: Rubye Oaks M.D.   On: 07/13/2017 05:38   Dg Chest 2 View  Result Date: 07/12/2017 CLINICAL DATA:  Shortness of breath for 2 days, mid and right chest  pain. Chills. PICC line placed 8 weeks ago to treat MRSA. EXAM: CHEST - 2 VIEW COMPARISON:  Chest x-rays dated 03/23/2014 and 01/30/2013. FINDINGS: New diffuse reticulonodular opacities throughout both lungs. No pleural effusion or pneumothorax seen. Mild cardiomegaly is stable. Overall cardiomediastinal silhouette is stable. Left-sided PICC line appears appropriately positioned with tip at the level of the mid/lower SVC. No acute or suspicious osseous finding. IMPRESSION: 1. New diffuse reticulonodular opacities throughout both lungs. Differential includes pulmonary edema related to CHF/volume overload and atypical pneumonia. Clinical history raises the additional possibility of septic emboli. Would consider chest CT for further characterization. 2. Left-sided PICC line in place with tip appropriately positioned at the level of the mid/lower SVC. 3. Stable mild cardiomegaly. Electronically Signed   By: Bary Richard M.D.   On: 07/12/2017 15:15   Ct Angio Chest Pe W And/or Wo Contrast  Result Date: 07/12/2017 CLINICAL DATA:  Chest pain with exertion. Shortness of breath  over the last day. Positive D-dimer. PICC line placement for treatment of staph infection. EXAM: CT ANGIOGRAPHY CHEST WITH CONTRAST TECHNIQUE: Multidetector CT imaging of the chest was performed using the standard protocol during bolus administration of intravenous contrast. Multiplanar CT image reconstructions and MIPs were obtained to evaluate the vascular anatomy. CONTRAST:  100 cc Isovue 370 COMPARISON:  Chest radiography same day. FINDINGS: Cardiovascular: Pulmonary arterial opacification is good. No pulmonary emboli. Heart size is normal. There is mild aortic atherosclerosis and coronary artery calcification. No pericardial fluid. Mediastinum/Nodes: No mass or lymphadenopathy. Lungs/Pleura: No pleural effusion. Widespread patchy bilateral airspace density, upper lobe predominant, worse on the right than the left, most consistent with  bronchopneumonia. The differential diagnosis would include acute allergic alveolitis and sepsis/ARDS. Acute edema or acute pulmonary hemorrhage would be less likely given this pattern. Upper Abdomen: Negative Musculoskeletal: Ordinary mild degenerative changes. Review of the MIP images confirms the above findings. IMPRESSION: No pulmonary emboli. Widespread bilateral airspace filling pattern most consistent with widespread bronchopneumonia. Findings could be seen in the setting of sepsis/ARDS or acute allergic alveolitis. Aortic Atherosclerosis (ICD10-I70.0). Electronically Signed   By: Paulina Fusi M.D.   On: 07/12/2017 20:06   Ct Lumbar Spine W Contrast  Result Date: 07/12/2017 CLINICAL DATA:  Possible sepsis.  Back pain. EXAM: CT LUMBAR SPINE WITH CONTRAST TECHNIQUE: Multidetector CT imaging of the lumbar spine was performed with intravenous contrast administration. CONTRAST:  100 cc Isovue 370 COMPARISON:  05/04/2016 FINDINGS: Segmentation: 5 lumbar type vertebral bodies. Alignment: 3 mm anterolisthesis L4-5.  2 mm anterolisthesis L5-S1. Vertebrae: No sign of fracture. No finding to suggest the presence of osteomyelitis. No other sign of spinal infection. Paraspinal and other soft tissues: Negative Disc levels: Ordinary degenerative changes at L2-3 and above. L3-4: Disc bulge.  Facet hypertrophy.  Mild lateral recess stenosis. L4-5: Advanced bilateral facet arthropathy with 3 mm anterolisthesis. Circumferential bulging of the disc. Severe multifactorial spinal stenosis that could be symptomatic. L5-S1: Advanced bilateral facet arthropathy with 2 mm anterolisthesis. Circumferential bulging of the disc. Stenosis of the lateral recesses and foramina that could be symptomatic. Bilateral sacroiliac osteoarthritis. IMPRESSION: No sign of spinal infection in the lumbar region. Multifactorial spinal stenosis at L4-5 and L5-S1 that could be a cause of back pain or neural compressive symptoms. Electronically Signed    By: Paulina Fusi M.D.   On: 07/12/2017 20:28   Ct T-spine No Charge  Result Date: 07/12/2017 CLINICAL DATA:  Mid back pain EXAM: CT THORACIC SPINE WITHOUT CONTRAST TECHNIQUE: Multidetector CT images of the thoracic were obtained using the standard protocol without intravenous contrast. COMPARISON:  Chest CT 07/12/2017 FINDINGS: Alignment: Normal. Vertebrae: There is mild multilevel thoracic vertebral bite a generative height loss with anterior osteophyte formation. No fracture. Mild multilevel disc space narrowing. Paraspinal and other soft tissues: Extensive airspace opacities within both lungs, better characterized on concomitant chest CT. Disc levels: Disc osteophyte complex at T6-T7 mildly narrows the spinal canal. No other spinal canal stenosis. Neural foramina are clearly patent. IMPRESSION: 1. No acute abnormality of the thoracic spine. 2. Mild narrowing of the spinal canal by a central disc osteophyte complex at T6-T7. 3. Confluent airspace disease, better characterized on concomitant chest CT. Please see the dedicated report for that study. Electronically Signed   By: Deatra Robinson M.D.   On: 07/12/2017 20:42    Microbiology: Recent Results (from the past 240 hour(s))  Blood culture (routine single)     Status: None (Preliminary result)   Collection Time: 07/12/17  10:30 AM  Result Value Ref Range Status   Specimen Description BLOOD RIGHT HAND  Final   Special Requests   Final    BOTTLES DRAWN AEROBIC AND ANAEROBIC Blood Culture results may not be optimal due to an inadequate volume of blood received in culture bottles   Culture   Final    NO GROWTH 4 DAYS Performed at [facility], [address] [place], [place] [address]    Report Status PENDING  Incomplete  Blood culture (routine single)     Status: None (Preliminary result)   Collection Time: 07/12/17 10:40 AM  Result Value Ref Range Status   Specimen Description BLOOD LEFT HAND  Final   Special Requests   Final     BOTTLES DRAWN AEROBIC AND ANAEROBIC Blood Culture results may not be optimal due to an inadequate volume of blood received in culture bottles   Culture   Final    NO GROWTH 4 DAYS Performed at [facility], [address] [place], [place] [address]    Report Status PENDING  Incomplete  Culture, blood (single)     Status: Abnormal   Collection Time: 07/12/17  4:21 PM  Result Value Ref Range Status   Specimen Description BLOOD PICC LINE  Final   Special Requests   Final    BOTTLES DRAWN AEROBIC AND ANAEROBIC Blood Culture adequate volume   Culture  Setup Time   Final    GRAM NEGATIVE RODS IN BOTH AEROBIC AND ANAEROBIC BOTTLES CRITICAL RESULT CALLED TO, READ BACK BY AND VERIFIED WITH: G ABOTT PHARMD 07/13/17 0444 JDW Performed at [facility], [address] [place], [place] [address]    Culture KLEBSIELLA PNEUMONIAE (A)  Final   Report Status 07/15/2017 FINAL  Final   Organism ID, Bacteria KLEBSIELLA PNEUMONIAE  Final      Susceptibility   Klebsiella pneumoniae - MIC*    AMPICILLIN RESISTANT Resistant     CEFAZOLIN <=4 SENSITIVE Sensitive     CEFEPIME <=1 SENSITIVE Sensitive     CEFTAZIDIME <=1 SENSITIVE Sensitive     CEFTRIAXONE <=1 SENSITIVE Sensitive     CIPROFLOXACIN <=0.25 SENSITIVE Sensitive     GENTAMICIN <=1 SENSITIVE Sensitive     IMIPENEM <=0.25 SENSITIVE Sensitive     TRIMETH/SULFA <=20 SENSITIVE Sensitive     AMPICILLIN/SULBACTAM 4 SENSITIVE Sensitive     PIP/TAZO <=4 SENSITIVE Sensitive     Extended ESBL NEGATIVE Sensitive     * KLEBSIELLA PNEUMONIAE  Blood Culture ID Panel (Reflexed)     Status: Abnormal   Collection Time: 07/12/17  4:21 PM  Result Value Ref Range Status   Enterococcus species NOT DETECTED NOT DETECTED Final   Listeria monocytogenes NOT DETECTED NOT DETECTED Final   Staphylococcus species NOT DETECTED NOT DETECTED Final   Staphylococcus aureus NOT DETECTED NOT DETECTED Final   Streptococcus species NOT DETECTED NOT DETECTED Final    Streptococcus agalactiae NOT DETECTED NOT DETECTED Final   Streptococcus pneumoniae NOT DETECTED NOT DETECTED Final   Streptococcus pyogenes NOT DETECTED NOT DETECTED Final   Acinetobacter baumannii NOT DETECTED NOT DETECTED Final   Enterobacteriaceae species DETECTED (A) NOT DETECTED Final    Comment: Enterobacteriaceae represent a large family of gram-negative bacteria, not a single organism. CRITICAL RESULT CALLED TO, READ BACK BY AND VERIFIED WITH: G ABOTT PHARMD 07/13/17 0444 JDW    Enterobacter cloacae complex NOT DETECTED NOT DETECTED Final   Escherichia coli NOT DETECTED NOT DETECTED Final   Klebsiella  oxytoca NOT DETECTED NOT DETECTED Final   Klebsiella pneumoniae DETECTED (A) NOT DETECTED Final    Comment: CRITICAL RESULT CALLED TO, READ BACK BY AND VERIFIED WITH: G ABOTT PHARMD 07/13/17 0444 JDW    Proteus species NOT DETECTED NOT DETECTED Final   Serratia marcescens NOT DETECTED NOT DETECTED Final   Carbapenem resistance NOT DETECTED NOT DETECTED Final   Haemophilus influenzae NOT DETECTED NOT DETECTED Final   Neisseria meningitidis NOT DETECTED NOT DETECTED Final   Pseudomonas aeruginosa NOT DETECTED NOT DETECTED Final   Candida albicans NOT DETECTED NOT DETECTED Final   Candida glabrata NOT DETECTED NOT DETECTED Final   Candida krusei NOT DETECTED NOT DETECTED Final   Candida parapsilosis NOT DETECTED NOT DETECTED Final   Candida tropicalis NOT DETECTED NOT DETECTED Final    Comment: Performed at [facility], [address] [place], [place] [address]  Urine culture     Status: Abnormal   Collection Time: 07/12/17  4:23 PM  Result Value Ref Range Status   Specimen Description URINE, CLEAN CATCH  Final   Special Requests   Final    NONE Performed at [facility], [address] [place], [place] [address]    Culture 20,000 COLONIES/mL KLEBSIELLA PNEUMONIAE (A)  Final   Report Status 07/14/2017 FINAL  Final   Organism ID, Bacteria KLEBSIELLA PNEUMONIAE (A)   Final      Susceptibility   Klebsiella pneumoniae - MIC*    AMPICILLIN RESISTANT Resistant     CEFAZOLIN <=4 SENSITIVE Sensitive     CEFTRIAXONE <=1 SENSITIVE Sensitive     CIPROFLOXACIN <=0.25 SENSITIVE Sensitive     GENTAMICIN <=1 SENSITIVE Sensitive     IMIPENEM <=0.25 SENSITIVE Sensitive     NITROFURANTOIN 64 INTERMEDIATE Intermediate     TRIMETH/SULFA <=20 SENSITIVE Sensitive     AMPICILLIN/SULBACTAM 4 SENSITIVE Sensitive     PIP/TAZO <=4 SENSITIVE Sensitive     Extended ESBL NEGATIVE Sensitive     * 20,000 COLONIES/mL KLEBSIELLA PNEUMONIAE  Culture, blood (routine x 2)     Status: None (Preliminary result)   Collection Time: 07/14/17  7:25 AM  Result Value Ref Range Status   Specimen Description BLOOD BLOOD LEFT HAND  Final   Special Requests   Final    BOTTLES DRAWN AEROBIC ONLY Blood Culture adequate volume   Culture   Final    NO GROWTH 2 DAYS Performed at [facility], [address] [place], [place] [address]    Report Status PENDING  Incomplete  Culture, blood (routine x 2)     Status: None (Preliminary result)   Collection Time: 07/14/17  7:30 AM  Result Value Ref Range Status   Specimen Description BLOOD RIGHT ANTECUBITAL  Final   Special Requests   Final    BOTTLES DRAWN AEROBIC ONLY Blood Culture adequate volume   Culture   Final    NO GROWTH 2 DAYS Performed at [facility], [address] [place], [place] [address]    Report Status PENDING  Incomplete  MRSA PCR Screening     Status: None   Collection Time: 07/14/17  9:20 AM  Result Value Ref Range Status   MRSA by PCR NEGATIVE NEGATIVE Final    Comment:        The GeneXpert MRSA Assay (FDA approved for NASAL specimens only), is one component of a comprehensive MRSA colonization surveillance program. It is not intended to diagnose MRSA infection nor to guide or monitor  treatment for MRSA infections. Performed at [facility], [address], [place], [place] [address]       Labs: CBC: Recent Labs  Lab 07/12/17 1133 07/12/17 1411 07/13/17 0041 07/13/17 0638 07/15/17 0725  WBC 4.4 5.3 6.4 11.6* 4.3  NEUTROABS 2.8 3.4  --  10.4*  --   HGB 8.1* 8.5* 7.9* 8.6* 8.6*  HCT 25.6* 26.3* 24.2* 26.8* 27.1*  MCV 88.0 86.8 87.7 87.0 89.1  PLT 132* 131* 89* 122* 212   Basic Metabolic Panel: Recent Labs  Lab 07/12/17 1133 07/12/17 1411 07/13/17 0041 07/13/17 0638 07/15/17 0725  NA 136 133*  --  131* 138  K 3.9 4.3  --  3.7 3.7  CL 100* 97*  --  99* 104  CO2 24 24  --  20* 26  GLUCOSE 338* 371*  --  291* 199*  BUN 11 13  --  12 8  CREATININE 1.04* 1.05* 1.33* 1.14* 0.95  CALCIUM 8.9 9.4  --  8.2* 9.4   Liver Function Tests: Recent Labs  Lab 07/12/17 1133 07/12/17 1411 07/13/17 0638 07/15/17 0725  AST 29 30 33 22  ALT 30 30 28 26   ALKPHOS 96 96 116 89  BILITOT 0.5 0.6 1.2 0.7  PROT 6.0* 6.4* 5.9* 6.0*  ALBUMIN 2.8* 2.8* 2.3* 2.5*   No results for input(s): LIPASE, AMYLASE in the last 168 hours. No results for input(s): AMMONIA in the last 168 hours. Cardiac Enzymes: Recent Labs  Lab 07/13/17 0041 07/13/17 0638 07/13/17 1910  TROPONINI 0.05* 0.04* 0.05*   BNP (last 3 results) Recent Labs    07/12/17 1133  BNP 92.5   CBG: Recent Labs  Lab 07/14/17 1106 07/14/17 1517 07/14/17 2052 07/15/17 0833 07/15/17 1233  GLUCAP 169* 165* 170* 175* 174*   Time spent: 35 minutes  Signed:  Lynden Oxford  [facility] 07/15/2017 , 5:40 PM

## 2017-07-17 LAB — CULTURE, BLOOD (SINGLE)
Culture: NO GROWTH
Culture: NO GROWTH

## 2017-07-19 LAB — CULTURE, BLOOD (ROUTINE X 2)
CULTURE: NO GROWTH
Culture: NO GROWTH
SPECIAL REQUESTS: ADEQUATE
Special Requests: ADEQUATE

## 2017-07-19 LAB — HYPERSENSITIVITY PNEUMONITIS
A. FUMIGATUS #1 ABS: NEGATIVE
A. PULLULANS ABS: NEGATIVE
Micropolyspora faeni, IgG: NEGATIVE
Pigeon Serum Abs: NEGATIVE
THERMOACTINOMYCES VULGARIS IGG: NEGATIVE
Thermoact. Saccharii: NEGATIVE

## 2017-07-27 ENCOUNTER — Encounter: Payer: Self-pay | Admitting: Infectious Diseases

## 2017-07-27 ENCOUNTER — Ambulatory Visit (INDEPENDENT_AMBULATORY_CARE_PROVIDER_SITE_OTHER): Payer: BLUE CROSS/BLUE SHIELD | Admitting: Infectious Diseases

## 2017-07-27 DIAGNOSIS — R7881 Bacteremia: Secondary | ICD-10-CM | POA: Diagnosis not present

## 2017-07-27 DIAGNOSIS — E1140 Type 2 diabetes mellitus with diabetic neuropathy, unspecified: Secondary | ICD-10-CM | POA: Diagnosis not present

## 2017-07-27 DIAGNOSIS — B961 Klebsiella pneumoniae [K. pneumoniae] as the cause of diseases classified elsewhere: Secondary | ICD-10-CM

## 2017-07-27 DIAGNOSIS — M25572 Pain in left ankle and joints of left foot: Secondary | ICD-10-CM | POA: Insufficient documentation

## 2017-07-27 DIAGNOSIS — M869 Osteomyelitis, unspecified: Secondary | ICD-10-CM | POA: Diagnosis not present

## 2017-07-27 DIAGNOSIS — M1990 Unspecified osteoarthritis, unspecified site: Secondary | ICD-10-CM | POA: Insufficient documentation

## 2017-07-27 NOTE — Assessment & Plan Note (Signed)
Does not check regularly. Was high in hosp but improved.  Has A1C checked quarterly. None in our system recently.  Will f/u with PCP

## 2017-07-27 NOTE — Assessment & Plan Note (Signed)
Will repeat her BCx.  

## 2017-07-27 NOTE — Progress Notes (Signed)
   Subjective:    Patient ID: [name], female    DOB: 02/27/1966, 52 y.o.   MRN: [id number]  HPI 52 yo F with hx of  52yo F with a history of type 2 diabetes, obesity, left total knee replacement 2007, right total knee replacement 2012. [facility]. She underwent irrigation and debridement of her right total knee replacement with exchange of parts. Cipro was added to her antibiotic regimen on October 8. She was found to have group B strep in her joint culture from her outpatient aspiration. On October 9 her antibiotics were changed to ceftriaxone 2 g once daily and she was discharged home. 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   Was planned to stop her anbx at 1 year (Oct 2015) however at ID f/u on 11-17 was continued on anbx through her foot surgery.   After L foot surgery 01-25-15 (5 bones removed, rod from great toe to ankle), she had fevers. She had 2nd anbx added. She had repeat aspirate of her L knee that was clear. She had Cx sent from her foot that Was (-).   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    She had further repair of her ankle on July 10, 2016.   She was seen at [facility] May 2018with MSSA in her prosthetic joint as well as a chronic non-healing ulcer (probed to her plate) on her L foot.    She was seen in ID on 6-25 and was noted to have continued issues with wound. Her anbx were extended to 8-14.   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 She was treated with vanco until she developed kidney dysfunction and was then changed to dapto.  Her course was then complicated by K pneumo bacteremia, related to her PIC line. In ED she developed fluid overload as well as ADR to ceftriaxone.  She was switched to doxy (9 more days), bactrim (5 more days) and d/c home on 07-15-17.  Her Cr was normal at d/c.   Her finger is doing well, still has some  swelling.  Has completed po anbx. No problems with these.  She has a boot on her L foot due to Charcot foot, s/p 6 surgeries (last 1 yr ago, multiple pieces of hardware). Has been walking well.   Review of Systems  Constitutional: Negative for appetite change, chills, fatigue, fever and unexpected weight change.  Respiratory: Negative for cough and shortness of breath.   Gastrointestinal: Negative for constipation and diarrhea.  Genitourinary: Negative for difficulty urinating.  Musculoskeletal: Positive for arthralgias.  Psychiatric/Behavioral: Positive for sleep disturbance. Negative for dysphoric mood.  Please see HPI. All other systems reviewed and negative.     Objective:   Physical Exam  Constitutional: She appears well-developed and well-nourished.  HENT:  Mouth/Throat: No oropharyngeal exudate.  Eyes: Pupils are equal, round, and reactive to light. EOM are normal.  Neck: Neck supple.  Cardiovascular: Normal rate, regular rhythm and normal heart sounds.  Pulmonary/Chest: Effort normal and breath sounds normal.  Abdominal: Soft. Bowel sounds are normal. There is no tenderness. There is no rebound.  Musculoskeletal:       Arms: Lymphadenopathy:    She has no cervical adenopathy.      Assessment & Plan:

## 2017-07-27 NOTE — Assessment & Plan Note (Signed)
She is doing well.  Has finished her anbx Will f/u with Dr Janee Mornhompson.

## 2017-07-28 ENCOUNTER — Inpatient Hospital Stay: Payer: BLUE CROSS/BLUE SHIELD | Admitting: Infectious Diseases

## 2017-08-02 LAB — CULTURE, BLOOD (SINGLE)
MICRO NUMBER: 90406428
MICRO NUMBER:: 90406424
Result:: NO GROWTH
Result:: NO GROWTH
SPECIMEN QUALITY:: ADEQUATE
SPECIMEN QUALITY:: ADEQUATE

## 2017-09-14 ENCOUNTER — Telehealth: Payer: Self-pay | Admitting: Infectious Diseases

## 2017-09-14 NOTE — Telephone Encounter (Signed)
called by [facility] ID team. She was adm with MSSA bacteremia and had L foot amputation.  Told them I would be glad to see her in 3 weeks in f/u.

## 2017-09-22 MED ORDER — ACETAMINOPHEN 325 MG PO TABS
975.00 | ORAL_TABLET | ORAL | Status: DC
Start: 2017-09-22 — End: 2017-09-22

## 2017-09-22 MED ORDER — SODIUM CHLORIDE 0.9 % IJ SOLN
10.00 | INTRAMUSCULAR | Status: DC
Start: 2017-09-22 — End: 2017-09-22

## 2017-09-22 MED ORDER — ALLOPURINOL 100 MG PO TABS
100.00 | ORAL_TABLET | ORAL | Status: DC
Start: 2017-09-23 — End: 2017-09-22

## 2017-09-22 MED ORDER — OXYCODONE HCL ER 10 MG PO T12A
10.00 | EXTENDED_RELEASE_TABLET | ORAL | Status: DC
Start: 2017-09-23 — End: 2017-09-22

## 2017-09-22 MED ORDER — ASPIRIN EC 325 MG PO TBEC
325.00 | DELAYED_RELEASE_TABLET | ORAL | Status: DC
Start: 2017-09-22 — End: 2017-09-22

## 2017-09-22 MED ORDER — OXYBUTYNIN CHLORIDE ER 5 MG PO TB24
5.00 | ORAL_TABLET | ORAL | Status: DC
Start: 2017-09-23 — End: 2017-09-22

## 2017-09-22 MED ORDER — OXYCODONE HCL 5 MG PO TABS
5.00 | ORAL_TABLET | ORAL | Status: DC
Start: ? — End: 2017-09-22

## 2017-09-22 MED ORDER — GABAPENTIN 300 MG PO CAPS
300.00 | ORAL_CAPSULE | ORAL | Status: DC
Start: 2017-09-22 — End: 2017-09-22

## 2017-09-22 MED ORDER — CLOTRIMAZOLE 1 % EX CREA
TOPICAL_CREAM | CUTANEOUS | Status: DC
Start: 2017-09-22 — End: 2017-09-22

## 2017-09-22 MED ORDER — CEFAZOLIN SODIUM-DEXTROSE 2-3 GM-%(50ML) IV SOLR
2.00 | INTRAVENOUS | Status: DC
Start: 2017-09-22 — End: 2017-09-22

## 2017-09-22 MED ORDER — THERA PO TABS
1.00 | ORAL_TABLET | ORAL | Status: DC
Start: 2017-09-23 — End: 2017-09-22

## 2017-09-22 MED ORDER — SODIUM CHLORIDE 0.9 % IJ SOLN
10.00 | INTRAMUSCULAR | Status: DC
Start: ? — End: 2017-09-22

## 2017-09-22 MED ORDER — GLUCOSAMINE SULFATE 500 MG PO CAPS
1500.00 | ORAL_CAPSULE | ORAL | Status: DC
Start: 2017-09-23 — End: 2017-09-22

## 2017-09-22 MED ORDER — DULOXETINE HCL 30 MG PO CPEP
60.00 | ORAL_CAPSULE | ORAL | Status: DC
Start: 2017-09-23 — End: 2017-09-22

## 2017-09-22 MED ORDER — GLIPIZIDE ER 5 MG PO TB24
10.00 | ORAL_TABLET | ORAL | Status: DC
Start: 2017-09-23 — End: 2017-09-22

## 2017-09-22 MED ORDER — CHOLECALCIFEROL 25 MCG (1000 UT) PO TABS
2000.00 | ORAL_TABLET | ORAL | Status: DC
Start: 2017-09-23 — End: 2017-09-22

## 2017-09-22 MED ORDER — INSULIN REGULAR HUMAN 100 UNIT/ML IJ SOLN
0.00 | INTRAMUSCULAR | Status: DC
Start: 2017-09-22 — End: 2017-09-22

## 2017-09-22 MED ORDER — GENERIC EXTERNAL MEDICATION
Status: DC
Start: ? — End: 2017-09-22

## 2017-10-08 ENCOUNTER — Other Ambulatory Visit: Payer: Self-pay

## 2017-10-08 HISTORY — PX: BELOW KNEE LEG AMPUTATION: SUR23

## 2017-10-08 NOTE — Telephone Encounter (Signed)
Pt called today stating she is out of Iv antibiotics thought she was supposed to be on antibiotics for 4 weeks. She is scheduled to see Dr. Ninetta LightsHatcher on the 25th, but will be out of antibiotics for one week until then. Pt would like know if she was supposed to be on antibiotics for three or four weeks, and if she should call pharmacy to have them send her a weeks worth of antibiotics. Lorenso CourierJose L Kaelyn Naut[place], [place]

## 2017-10-15 ENCOUNTER — Other Ambulatory Visit: Payer: Self-pay | Admitting: Pharmacist

## 2017-10-15 NOTE — Progress Notes (Signed)
[facility] pharmacy lab review  

## 2017-10-19 ENCOUNTER — Ambulatory Visit (INDEPENDENT_AMBULATORY_CARE_PROVIDER_SITE_OTHER): Payer: BLUE CROSS/BLUE SHIELD | Admitting: Infectious Diseases

## 2017-10-19 ENCOUNTER — Telehealth: Payer: Self-pay

## 2017-10-19 ENCOUNTER — Encounter: Payer: Self-pay | Admitting: Infectious Diseases

## 2017-10-19 DIAGNOSIS — R7881 Bacteremia: Secondary | ICD-10-CM | POA: Diagnosis not present

## 2017-10-19 DIAGNOSIS — M869 Osteomyelitis, unspecified: Secondary | ICD-10-CM | POA: Diagnosis not present

## 2017-10-19 NOTE — Progress Notes (Signed)
   Subjective:    Patient ID: [name], female    DOB: 1965/08/23, 52 y.o.   MRN: [id number]  HPI 52 yo F with hx of type 2 diabetes, obesity, left total knee replacement 2007, right total knee replacement 2012. She came to [facility] on January 30, 2013. She underwent irrigation and debridement of her right total knee replacement with exchange of parts. Cipro was added to her antibiotic regimen on October 8. She was found to have group B strep in her joint culture from her outpatient aspiration. On October 9 her antibiotics were changed to ceftriaxone 2 g once daily and she was discharged home. 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   Was planned to stop her anbx at 1 year (Oct 2015) however at ID f/u on 11-17 was continued on anbx through her foot surgery.   After L foot surgery 01-25-15 (5 bones removed, rod from great toe to ankle), she had fevers. She had 2nd anbx added. She had repeat aspirate of her L knee that was clear. She had Cx sent from her foot that Was (-).   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   She had further repair of her ankle on July 10, 2016.   She was seen at [facility] May 2018with MSSA in her prosthetic joint as well as a chronic non-healing ulcer (probed to her plate) on her L foot.    She was seen in ID on 6-25 and was noted to have continued issues with wound. Her anbx were extended to 8-14.  She has a boot on her L foot due to Charcot foot, s/p 6 surgeries (last 1 yr ago, multiple pieces of hardware). Has been walking well.  Since her last visit 07-2017, she had L BKA and TMR 09-13-17 at [facility]. She had MSSA bacteremia at that time, was treated with nfcillin --> cefazolin. She has been on her anbx til she had f/u here. Her TTE was (-).  No f/c. Wound has been healing well.  No problems with PIC line.  ESR > 140 (09-06-17)  CRP 38 (09-06-17)  Review of Systems  Constitutional: Negative for chills and fever.    Gastrointestinal: Negative for constipation and diarrhea.  Genitourinary: Negative for difficulty urinating.  Skin: Positive for wound.  FSG have been "good", has done quarterly at her MD (last A1C was 5.6% in hospital).  Please see HPI. All other systems reviewed and negative.     Objective:   Physical Exam  Constitutional: She appears well-developed and well-nourished.  HENT:  Mouth/Throat: No oropharyngeal exudate.  Eyes: Pupils are equal, round, and reactive to light. EOM are normal.  Neck: Normal range of motion. Neck supple.  Cardiovascular: Normal rate, regular rhythm and normal heart sounds.  Pulmonary/Chest: Effort normal and breath sounds normal.  Abdominal: Soft. Bowel sounds are normal. There is no tenderness. There is no guarding.  Musculoskeletal:       Legs: Lymphadenopathy:    She has no cervical adenopathy.         Assessment & Plan:

## 2017-10-19 NOTE — Assessment & Plan Note (Signed)
Will repeat her BCx today 

## 2017-10-19 NOTE — Telephone Encounter (Signed)
Called [facility] to inform them that Picc line was pulled today in our office by RN.  Lorenso CourierJose L Maldonado, [place]

## 2017-10-19 NOTE — Assessment & Plan Note (Signed)
She is doing well Has had f/u with plastics Has f/u this week with ortho.  Stitch removal per them.  Will not check further labs at this point.  Will see her back prn.

## 2017-10-19 NOTE — Progress Notes (Signed)
Per verbal order from Dr Ninetta LightsHatcher, 37 cm Single Lumen Peripherally Inserted Central Catheter removed from right basilic, tip intact. No sutures present. RN unable to confirm length per chart (placed at outside facility), but patient reports the length as 37 cm. Dressing was clean and dry. Petroleum dressing applied. Pt advised no heavy lifting with this arm, leave dressing for 24 hours and call the office or seek emergent care if dressing becomes soaked with blood or sharp pain presents. Patient verbalized understanding and agreement.  Patient's questions answered to their satisfaction. Patient tolerated procedure well, RN walked patient to lab for blood cultures. Andree CossHowell, Donnis Phaneuf M, RN

## 2017-10-25 LAB — CULTURE, BLOOD (SINGLE)
MICRO NUMBER: 90757661
MICRO NUMBER:: 90757686

## 2017-12-16 ENCOUNTER — Ambulatory Visit: Payer: BLUE CROSS/BLUE SHIELD | Attending: Physical Medicine & Rehabilitation | Admitting: Physical Therapy

## 2017-12-16 ENCOUNTER — Other Ambulatory Visit: Payer: Self-pay

## 2017-12-16 ENCOUNTER — Encounter: Payer: Self-pay | Admitting: Physical Therapy

## 2017-12-16 DIAGNOSIS — R2689 Other abnormalities of gait and mobility: Secondary | ICD-10-CM | POA: Diagnosis present

## 2017-12-16 DIAGNOSIS — R296 Repeated falls: Secondary | ICD-10-CM | POA: Diagnosis present

## 2017-12-16 DIAGNOSIS — R293 Abnormal posture: Secondary | ICD-10-CM | POA: Insufficient documentation

## 2017-12-16 DIAGNOSIS — R2681 Unsteadiness on feet: Secondary | ICD-10-CM | POA: Insufficient documentation

## 2017-12-16 DIAGNOSIS — M6281 Muscle weakness (generalized): Secondary | ICD-10-CM

## 2017-12-16 NOTE — Therapy (Signed)
[facility] [address] [place], [place], [address] Phone: [phone]   Fax:  [phone]  Physical Therapy Evaluation  Patient Details  Name: [name] MRN: [id number] Date of Birth: October 26, 1965 Referring Provider: Rolena Infante, MD   Encounter Date: 12/16/2017  PT End of Session - 12/16/17 2126    Visit Number  1    Number of Visits  20    Date for PT Re-Evaluation  02/18/18    Authorization Type  [employer] $700 deductible & $2500 oop max met; pt covered 100%, visit limit - PT, OT, chiropractore 30 combined; zero used    Authorization - Visit Number  1    Authorization - Number of Visits  30    PT Start Time  0804    PT Stop Time  0845    PT Time Calculation (min)  41 min    Equipment Utilized During Treatment  Gait belt    Activity Tolerance  Patient tolerated treatment well    Behavior During Therapy  WFL for tasks assessed/performed       Past Medical History:  Diagnosis Date  . Arthritis   . Diabetes mellitus without complication ([place])   . DOE (dyspnea on exertion) 07/12/2017  . FUO (fever of unknown origin) 07/12/2017  . Gastroenteritis 04/2016  . Headache(784.0)    hormonal headache  . Low back pain 07/12/2017  . Neuropathy    bilateral feet  . Obese   . Pleuritic chest pain 07/12/2017  . Pneumonia    hx 20+ years ago  . Sleep apnea    bipap 1 yr  . Ulcer of foot ([place])    toe ulcer greater than a year ago    Past Surgical History:  Procedure Laterality Date  . AMPUTATION Right 06/11/2017   Procedure: RIGHT LONG FINGER AMPUTATION DIGIT;  Surgeon: Milly Jakob, MD;  Location: [facility];  Service: Orthopedics;  Laterality: Right;  . ANKLE FUSION Left 08/03/2014   Procedure: Left Tibiocalcaneal Fusion;  Surgeon: Newt Minion, MD;  Location: [facility];  Service: Orthopedics;  Laterality: Left;  . APPLICATION OF WOUND VAC Left 08/03/2014   Procedure: APPLICATION OF WOUND VAC;  Surgeon: Newt Minion, MD;   Location: [facility];  Service: Orthopedics;  Laterality: Left;  . I&D KNEE WITH POLY EXCHANGE Right 01/31/2013   Procedure: IRRIGATION AND DEBRIDEMENT KNEE WITH POLY EXCHANGE possible Antibiotic Spacer;  Surgeon: Garald Balding, MD;  Location: [facility];  Service: Orthopedics;  Laterality: Right;  . INCISION AND DRAINAGE Right 01/31/2013   ANTIBIOTIC SPACER  RIGHT KNEE  DR Durward Fortes   . KNEE ARTHROSCOPY     left knee  . ORIF ANKLE FRACTURE Left 03/16/2014   Procedure: Foot Excision Charcot Collapse,  Internal Fixation;  Surgeon: Newt Minion, MD;  Location: [facility];  Service: Orthopedics;  Laterality: Left;  . REPLACEMENT TOTAL KNEE     left  . TOTAL KNEE ARTHROPLASTY  06/23/2011   Procedure: TOTAL KNEE ARTHROPLASTY;  Surgeon: Garald Balding, MD;  Location: [facility];  Service: Orthopedics;  Laterality: Right;  RIGHT TOTAL KNEE REPLACEMENT     There were no vitals filed for this visit.   Subjective Assessment - 12/16/17 0807    Subjective  This 52yo female was referred on 12/09/2017 by Rolena Infante, MD for PT. She underwent a left Transtibial Amputation on 09/10/2017 guillotine & closure 09/13/2017  at [facility]. She received prosthesis on 12/13/2017.     Pertinent History  L TTA, L TKA 2007, R TKA 2012, DM2, neuropathy, gout, OA, morbid obesity,     Limitations  Lifting;Standing;Walking;House hold activities    Patient Stated Goals  to use prosthesis walk outdoors, hiking, ride horse,     Currently in Pain?  No/denies         [facility] PT Assessment - 12/16/17 0800      Assessment   Medical Diagnosis  left Transtibial Amputation    Referring Provider  Rolena Infante, MD    Onset Date/Surgical Date  12/13/17   prosthesis delivery   Hand Dominance  Right    Prior Therapy  pre-pros 5 days inpatient      Precautions   Precautions  Fall      Balance Screen   Has the patient fallen in the past 6 months  Yes    How many times?  2   no injuries   Has the patient had a decrease in activity level because  of a fear of falling?   No    Is the patient reluctant to leave their home because of a fear of falling?   No      Home Environment   Living Environment  Private residence    Living Arrangements  Spouse/significant other;Parent   disabled father dementia with ADL assist   Type of [place] to enter;Ramped entrance   main 1 step, 2nd entrance 3 step no rail   Entrance Stairs-Number of Steps  1-3    Entrance Stairs-Rails  None    Home Layout  One level   threshold ~2" between rooms   [place] - 2 wheels;Crutches;Cane - single point;Shower seat - built in;Grab bars - tub/shower;Wheelchair - manual      Prior Function   Level of Independence  Independent;Independent with household mobility without device;Independent with community mobility without device   crow walker boot prior amputation   Vocation  Unemployed   wants to return to farm work   [employer], push, pull, climb farm work/ horses    Leisure  ride horses, hiking, swim,       Posture/Postural Control   Posture/Postural Control  Postural limitations    Postural Limitations  Rounded Shoulders;Forward head;Flexed trunk;Weight shift right      ROM / Strength   AROM / PROM / Strength  AROM;Strength      AROM   Overall AROM   Within functional limits for tasks performed;Deficits    Overall AROM Comments  right ankle weakness Charcot deformity with neuropathy; in Ankle Stabilization Orthosis      Strength   Overall Strength  Within functional limits for tasks performed      Transfers   Transfers  Sit to Stand;Stand to Sit    Sit to Stand  5: Supervision;With upper extremity assist;With armrests;From chair/3-in-1   uses back of legs against chair to stabilize   Stand to Sit  5: Supervision;With upper extremity assist;With armrests;To chair/3-in-1      Ambulation/Gait   Ambulation/Gait  Yes    Ambulation/Gait Assistance  5: Supervision    Ambulation/Gait Assistance  Details  excesive UE weight bearing on crutches; PT adjusted crutch height    Ambulation Distance (Feet)  150 Feet    Assistive device  Lofstrands;Prosthesis    Gait Pattern  Step-through pattern;Decreased step length - right;Decreased stance time - left;Decreased stride length;Decreased weight shift to left;Left hip hike;Antalgic;Lateral hip instability;Trunk flexed;Abducted - left;Poor  foot clearance - left    Ambulation Surface  Indoor;Level    Gait velocity  1.13 ft/sec   indicates high fall risk     Standardized Balance Assessment   Standardized Balance Assessment  Berg Balance Test      Berg Balance Test   Sit to Stand  Able to stand  independently using hands    Standing Unsupported  Able to stand 2 minutes with supervision    Sitting with Back Unsupported but Feet Supported on Floor or Stool  Able to sit safely and securely 2 minutes    Stand to Sit  Controls descent by using hands    Transfers  Able to transfer safely, definite need of hands    Standing Unsupported with Eyes Closed  Able to stand 3 seconds    Standing Ubsupported with Feet Together  Able to place feet together independently and stand for 1 minute with supervision    From Standing, Reach Forward with Outstretched Arm  Can reach forward >5 cm safely (2")    From Standing Position, Pick up Object from Floor  Able to pick up shoe, needs supervision    From Standing Position, Turn to Look Behind Over each Shoulder  Turn sideways only but maintains balance    Turn 360 Degrees  Needs assistance while turning    Standing Unsupported, Alternately Place Feet on Step/Stool  Needs assistance to keep from falling or unable to try    Standing Unsupported, One Foot in Front  Loses balance while stepping or standing    Standing on One Leg  Unable to try or needs assist to prevent fall    Total Score  28      Prosthetics Assessment - 12/16/17 0800      Prosthetics   Prosthetic Care Dependent with  Skin check;Residual limb  care;Care of non-amputated limb;Prosthetic cleaning;Ply sock cleaning;Correct ply sock adjustment;Proper wear schedule/adjustment;Proper weight-bearing schedule/adjustment    Donning prosthesis   Supervision    Doffing prosthesis   Supervision    Current prosthetic wear tolerance (days/week)   daily 3 of 3 days since delivery    Current prosthetic wear tolerance (#hours/day)   1 hr 2x/day;   PT instructed to wear 2 hrs 3x/day increase q5 days no issue   Current prosthetic weight-bearing tolerance (hours/day)   Pt tolerated standing 5 minutes with partial weight on prosthesis without c/o pain or discomfotrt    Edema  pitting    Residual limb condition   no open areas, normal color, temperature & moisture,  minimal to no hair growth, cylinderical shape    K code/activity level with prosthetic use   K3 TTA Total Contact socket, silicon gel liner with pin lock, dynamic response foot               Objective measurements completed on examination: See above findings.      [facility] Adult PT Treatment/Exercise - 12/16/17 0800      Prosthetics   Education Provided  Skin check;Residual limb care;Prosthetic cleaning;Correct ply sock adjustment;Proper Donning;Proper wear schedule/adjustment    Person(s) Educated  Patient    Education Method  Explanation;Demonstration;Tactile cues;Verbal cues    Education Method  Verbalized understanding;Returned demonstration;Tactile cues required;Verbal cues required;Needs further instruction             PT Education - 12/16/17 0844    Education Details  [facility];  Plan of care    Person(s) Educated  Patient    Methods  Explanation;Other (  comment)   brochure   Comprehension  Verbalized understanding       PT Short Term Goals - 12/16/17 2149      PT SHORT TERM GOAL #1   Title  patient demonstrates proper donning & verbalizes proper cleaning.  (All STGs Target Date: 01/14/2018)    Time  1    Period  Months    Status  New     Target Date  01/14/18      PT SHORT TERM GOAL #2   Title  Patient tolerates daily prosthesis wear >10 hrs total per day without skin or limb pain issues.     Time  1    Period  Months    Status  New    Target Date  01/14/18      PT SHORT TERM GOAL #3   Title  Patient ambulates 500' including grass with cane & prosthesis with supervision.     Time  1    Period  Months    Status  New    Target Date  01/14/18      PT SHORT TERM GOAL #4   Title  Patient negotiates ramps, curbs & stairs 1 rail with cane & prosthesis with supervision.     Time  1    Period  Months    Status  New    Target Date  01/14/18      PT SHORT TERM GOAL #5   Title  Patient reaches 10" and picks up objects from floor without loss of balance with supervision.     Time  1    Period  Months    Status  New    Target Date  01/14/18        PT Long Term Goals - 12/16/17 2141      PT LONG TERM GOAL #1   Title  Patient demonstrates & verbalizes proper prosthetic care to enable safe prosthesis use. (All LTGs Target Date: 02/18/2018)    Time  10    Period  Weeks    Status  New    Target Date  02/18/18      PT LONG TERM GOAL #2   Title  Patient tolerates daily prosthesis wear >90% of awake hours without skin issues or limb pain to enable function throughout her day.     Time  10    Period  Weeks    Status  New    Target Date  02/18/18      PT LONG TERM GOAL #3   Title  Berg Balance >/= 45 /56  to indicate lower fall risk    Time  10    Period  Weeks    Status  New    Target Date  02/18/18      PT LONG TERM GOAL #4   Title  Patient ambulates 1000' including grass with LRAD & prosthesis modified independent to enable mobility on farm.     Time  10    Period  Weeks    Status  New    Target Date  02/18/18      PT LONG TERM GOAL #5   Title  Patient negotiates ramps, curbs & stairs with LRAD & prosthesis modified independent for community access.     Time  10    Period  Weeks    Status  New     Target Date  02/18/18      Additional Long Term Goals   Additional Long Term  Goals  Yes      PT LONG TERM GOAL #6   Title  Patient ambulates 200' with cane or less carrying 10# with prosthesis modified independent.    Time  10    Period  Weeks    Status  New    Target Date  02/18/18      PT LONG TERM GOAL #7   Title  Patient verbalizes recommendations for mounting/dismounting her horses to achieve her goal.     Time  10    Period  Weeks    Status  New    Target Date  02/18/18             Plan - 12/16/17 2133    Clinical Impression Statement  This 52yo female underwent a left Transtibial Amputation 09/10/2017 and received her first prosthesis 12/13/2017. She is dependent in proper use of prosthesis which increases risk of skin & pain issues. She has worn prosthesis daily 3 of 3 days since delivery but only for 1 hr 2 times per day which limits function during her day.  Berg Balance of 28/56 indicates high fall risk and dependency in standing ADLs. She has gait deviations, velocity of 1.13 ft/sec and dependency on BUE support of crutches indicating gait limitations & fall risk. Patient appears would benefit from skilled PT intervention to improve function & safety with prosthesis.     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 L TTA, L TKA 2007, R TKA 2012, DM2, neuropathy, gout, OA, morbid obesity,     Clinical Presentation  Stable    Clinical Decision Making  Low    Rehab Potential  Good    Clinical Impairments Affecting Rehab Potential  high fall risk, gait dependency, unknowledgeable in safe prosthesis use/care    PT Frequency  2x / week    PT Duration  Other (comment)   10 weeks   PT Treatment/Interventions  ADLs/Self Care Home Management;[place] Repostioning;Gait training;DME Instruction;Stair training;Functional mobility training;Therapeutic activities;Therapeutic exercise;Balance training;Neuromuscular  re-education;Patient/family education;Orthotic Fit/Training;Prosthetic Training;Manual techniques;Vestibular    PT Next Visit Plan  review prosthetic care, HEP at sink for midline, instruct in negotiating ramps, curbs & stairs with prosthesis    Consulted and Agree with Plan of Care  Patient       Patient will benefit from skilled therapeutic intervention in order to improve the following deficits and impairments:  Abnormal gait, Decreased activity tolerance, Decreased balance, Decreased endurance, Decreased mobility, Decreased strength, Dizziness, Impaired flexibility, Postural dysfunction, Prosthetic Dependency  Visit Diagnosis: Unsteadiness on feet  Abnormal posture  Other abnormalities of gait and mobility  Muscle weakness (generalized)  Repeated falls     Problem List Patient Active Problem List   Diagnosis Date Noted  . Osteomyelitis, lower leg ([place]) 10/19/2017  . MSSA bacteremia 10/19/2017  . Left ankle pain 07/27/2017  . Osteoarthritis 07/27/2017  . Septic shock ([place]) 07/13/2017  . Shortness of breath 07/13/2017  . Bacteremia due to Klebsiella pneumoniae 07/13/2017  . Allergic reaction to drug 07/13/2017  . Uncontrolled type 2 diabetes mellitus with hyperglycemia ([place]) 07/12/2017  . Idiopathic peripheral neuropathy   . Osteomyelitis of right hand ([place])   . Osteomyelitis of finger of right hand ([place]) 06/08/2017  . MRSA infection   . 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, without long-term current use of insulin ([place]) 06/08/2016  . Controlled type 2 diabetes with neuropathy ([place]) 05/04/2016  . Open wound  of plantar aspect of foot 01/13/2016  . Morbid (severe) obesity due to excess calories ([place]) 01/17/2015  . Deformity of ankle and foot, acquired 12/06/2014  . Charcot's joint of foot, left 08/03/2014  . Hypersomnia 12/20/2013  . Obstructive sleep apnea syndrome, severe 12/20/2013  . Other fatigue 12/20/2013  . Snoring 12/20/2013  .  Prosthetic joint infection ([place]) 01/31/2013  . Postoperative anemia due to acute blood loss 06/25/2011  . Osteoarthritis of knee 06/23/2011  . Obesity, morbid (more than 100 lbs over ideal weight or BMI > 40) ([facility]) 06/23/2011    Alexandru Moorer PT, DPT 12/16/2017, 9:54 PM  [facility] [address] [place], [place], [address] Phone: [phone]   Fax:  [phone]  Name: [name] MRN: [id number] Date of Birth: 1965-08-06

## 2017-12-17 ENCOUNTER — Ambulatory Visit: Payer: BLUE CROSS/BLUE SHIELD | Admitting: Physical Therapy

## 2017-12-17 ENCOUNTER — Encounter: Payer: Self-pay | Admitting: Physical Therapy

## 2017-12-17 DIAGNOSIS — R2681 Unsteadiness on feet: Secondary | ICD-10-CM

## 2017-12-17 DIAGNOSIS — R2689 Other abnormalities of gait and mobility: Secondary | ICD-10-CM

## 2017-12-17 DIAGNOSIS — M6281 Muscle weakness (generalized): Secondary | ICD-10-CM

## 2017-12-17 NOTE — Patient Instructions (Signed)
Access Code: [email or web address]  URL: [email or web address]  Date: 12/17/2017  Prepared by: Veda CanningLynn Kendahl Bumgardner   Exercises  Standing Hip Abduction with Counter Support - 20 reps - 1 sets - 2x daily - 5-6x weekly  Standing Hip Extension - 20 reps - 1 sets - 2x daily - 5-6x weekly  Staggered Stance Forward Backward Weight Shift with Counter Support - 10 reps - 1 sets - 2x daily - 7x weekly

## 2017-12-18 NOTE — Therapy (Signed)
[facility] [address] [place], [place], [address] Phone: [phone]   Fax:  [phone]  Physical Therapy Treatment  Patient Details  Name: [name] MRN: [id number] Date of Birth: Feb 25, 1966 Referring Provider: Rolena Infante, MD   Encounter Date: 12/17/2017  PT End of Session - 12/17/17 1700    Visit Number  2    Number of Visits  20    Date for PT Re-Evaluation  02/18/18    Authorization Type  [employer] $700 deductible & $2500 oop max met; pt covered 100%, visit limit - PT, OT, chiropractore 30 combined; zero used    Authorization - Visit Number  2    Authorization - Number of Visits  30    PT Start Time  1112    PT Stop Time  1155    PT Time Calculation (min)  43 min    Equipment Utilized During Treatment  --    Activity Tolerance  Patient tolerated treatment well    Behavior During Therapy  [facility] for tasks assessed/performed       Past Medical History:  Diagnosis Date  . Arthritis   . Diabetes mellitus without complication ([place])   . DOE (dyspnea on exertion) 07/12/2017  . FUO (fever of unknown origin) 07/12/2017  . Gastroenteritis 04/2016  . Headache(784.0)    hormonal headache  . Low back pain 07/12/2017  . Neuropathy    bilateral feet  . Obese   . Pleuritic chest pain 07/12/2017  . Pneumonia    hx 20+ years ago  . Sleep apnea    bipap 1 yr  . Ulcer of foot ([place])    toe ulcer greater than a year ago    Past Surgical History:  Procedure Laterality Date  . AMPUTATION Right 06/11/2017   Procedure: RIGHT LONG FINGER AMPUTATION DIGIT;  Surgeon: Milly Jakob, MD;  Location: [place];  Service: Orthopedics;  Laterality: Right;  . ANKLE FUSION Left 08/03/2014   Procedure: Left Tibiocalcaneal Fusion;  Surgeon: Newt Minion, MD;  Location: [place];  Service: Orthopedics;  Laterality: Left;  . APPLICATION OF WOUND VAC Left 08/03/2014   Procedure: APPLICATION OF WOUND VAC;  Surgeon: Newt Minion, MD;  Location:  [place];  Service: Orthopedics;  Laterality: Left;  . I&D KNEE WITH POLY EXCHANGE Right 01/31/2013   Procedure: IRRIGATION AND DEBRIDEMENT KNEE WITH POLY EXCHANGE possible Antibiotic Spacer;  Surgeon: Garald Balding, MD;  Location: [place];  Service: Orthopedics;  Laterality: Right;  . INCISION AND DRAINAGE Right 01/31/2013   ANTIBIOTIC SPACER  RIGHT KNEE  DR Durward Fortes   . KNEE ARTHROSCOPY     left knee  . ORIF ANKLE FRACTURE Left 03/16/2014   Procedure: Foot Excision Charcot Collapse,  Internal Fixation;  Surgeon: Newt Minion, MD;  Location: [place];  Service: Orthopedics;  Laterality: Left;  . REPLACEMENT TOTAL KNEE     left  . TOTAL KNEE ARTHROPLASTY  06/23/2011   Procedure: TOTAL KNEE ARTHROPLASTY;  Surgeon: Garald Balding, MD;  Location: [place];  Service: Orthopedics;  Laterality: Right;  RIGHT TOTAL KNEE REPLACEMENT     There were no vitals filed for this visit.  Subjective Assessment - 12/17/17 1358    Subjective  Patient reports difficulty finding right # of ply this morning--either got too many pin clicks or couldn't get seated into it. Asking when she can use her prosthesis to horse back ride. (She is currently riding her horse without prosthesis, "but it's  not the easiest thing to get on the horse. I have to get on my knees in the back of my truck, stand up on one leg and then sling leg over to sit on horse."     Pertinent History  L TTA, L TKA 2007, R TKA 2012, DM2, neuropathy, gout, OA, morbid obesity,     Limitations  Lifting;Standing;Walking;House hold activities    Patient Stated Goals  to use prosthesis walk outdoors, hiking, ride horse,     Currently in Pain?  No/denies                       [facility] Adult PT Treatment/Exercise - 12/17/17 1401      Transfers   Transfers  Sit to Stand;Stand to Sit    Sit to Stand  5: Supervision;Without upper extremity assist;From chair/3-in-1    Stand to Sit  5: Supervision;Without upper extremity assist;To chair/3-in-1     Number of Reps  Other reps (comment)   5     Ambulation/Gait   Ambulation/Gait Assistance  5: Supervision    Ambulation/Gait Assistance Details  for safety    Ambulation Distance (Feet)  30 Feet   30, 40   Assistive device  Lofstrands;Prosthesis    Gait Pattern  Step-through pattern;Decreased step length - right;Decreased stance time - left;Decreased stride length;Decreased weight shift to left;Left hip hike;Lateral hip instability;Trunk flexed;Abducted - left;Poor foot clearance - left    Ambulation Surface  Indoor      Knee/Hip Exercises: Standing   Hip Abduction  Stengthening;Both;1 set;20 reps;Knee straight   at counter: alternating each side for incr balance challenge   Abduction Limitations  vc and physical assist to not externally rotate lifting leg; instructed to lift slightly behind     Hip Extension  Stengthening;Both;1 set;20 reps;Knee bent    Extension Limitations  at sink; cannot clear left prosthetic foot unless bends left knee; vc to isolate hip    Other Standing Knee Exercises  pt demonstrated the weightshifting she has been doing at the sink (instructed by orthotist upon delivery of prosthesis); educated to add hip flexor stretch hold 30 sec after completes 10 reps ant-post weight shifting; "standing tall" thru hips with lateral wt-shifting, then can lean into each hip for abductor stretch x 30 sec; added staggered stance wt-shifting bil UE support focus on "tucking forward hip in, stand tall" (not dropping pelvis, active hip abduction in stance      Prosthetics   Prosthetic Care Comments   pt wearing 3 ply on arrival; gradually upped to 7 ply (with pt doffing/donning each time independently) to reduce #clicks of pin to ~7    Current prosthetic wear tolerance (#hours/day)   wore 2 hrs, 3x/day with 2 hr rest between    Current prosthetic weight-bearing tolerance (hours/day)   denies pain in wtbearing    Education Provided  Correct ply sock adjustment;Residual limb care   pt  feels "slipping" in liner w/ sweat; educated on antipersp   Person(s) Educated  Patient    Education Method  Explanation;Demonstration    Education Method  Verbalized understanding;Returned demonstration             PT Education - 12/18/17 [phone]    Education Details  additions to HEP; use of antiperspirant due to excessive sweating    Person(s) Educated  Patient    Methods  Explanation;Demonstration;Tactile cues;Verbal cues;Handout    Comprehension  Verbalized understanding;Returned demonstration;Verbal cues required;Tactile cues required;Need further instruction  PT Short Term Goals - 12/16/17 2149      PT SHORT TERM GOAL #1   Title  patient demonstrates proper donning & verbalizes proper cleaning.  (All STGs Target Date: 01/14/2018)    Time  1    Period  Months    Status  New    Target Date  01/14/18      PT SHORT TERM GOAL #2   Title  Patient tolerates daily prosthesis wear >10 hrs total per day without skin or limb pain issues.     Time  1    Period  Months    Status  New    Target Date  01/14/18      PT SHORT TERM GOAL #3   Title  Patient ambulates 500' including grass with cane & prosthesis with supervision.     Time  1    Period  Months    Status  New    Target Date  01/14/18      PT SHORT TERM GOAL #4   Title  Patient negotiates ramps, curbs & stairs 1 rail with cane & prosthesis with supervision.     Time  1    Period  Months    Status  New    Target Date  01/14/18      PT SHORT TERM GOAL #5   Title  Patient reaches 10" and picks up objects from floor without loss of balance with supervision.     Time  1    Period  Months    Status  New    Target Date  01/14/18        PT Long Term Goals - 12/16/17 2141      PT LONG TERM GOAL #1   Title  Patient demonstrates & verbalizes proper prosthetic care to enable safe prosthesis use. (All LTGs Target Date: 02/18/2018)    Time  10    Period  Weeks    Status  New    Target Date  02/18/18      PT  LONG TERM GOAL #2   Title  Patient tolerates daily prosthesis wear >90% of awake hours without skin issues or limb pain to enable function throughout her day.     Time  10    Period  Weeks    Status  New    Target Date  02/18/18      PT LONG TERM GOAL #3   Title  Berg Balance >/= 45 /56  to indicate lower fall risk    Time  10    Period  Weeks    Status  New    Target Date  02/18/18      PT LONG TERM GOAL #4   Title  Patient ambulates 1000' including grass with LRAD & prosthesis modified independent to enable mobility on farm.     Time  10    Period  Weeks    Status  New    Target Date  02/18/18      PT LONG TERM GOAL #5   Title  Patient negotiates ramps, curbs & stairs with LRAD & prosthesis modified independent for community access.     Time  10    Period  Weeks    Status  New    Target Date  02/18/18      Additional Long Term Goals   Additional Long Term Goals  Yes      PT LONG TERM GOAL #6   Title  Patient ambulates 200' with cane or less carrying 10# with prosthesis modified independent.    Time  10    Period  Weeks    Status  New    Target Date  02/18/18      PT LONG TERM GOAL #7   Title  Patient verbalizes recommendations for mounting/dismounting her horses to achieve her goal.     Time  10    Period  Weeks    Status  New    Target Date  02/18/18            Plan - 12/17/17 1357    Clinical Impression Statement  Patient highly motivated and quickly able to adjust her posture or technique as instructed. Focus on establishing HEP with focus today and hip strengthening and balance. Anticipate she will make excellent progress.     Rehab Potential  Good    Clinical Impairments Affecting Rehab Potential  high fall risk, gait dependency, unknowledgeable in safe prosthesis use/care    PT Frequency  2x / week    PT Duration  Other (comment)   10 weeks   PT Treatment/Interventions  ADLs/Self Care Home Management;Canalith Repostioning;Gait training;DME  Instruction;Stair training;Functional mobility training;Therapeutic activities;Therapeutic exercise;Balance training;Neuromuscular re-education;Patient/family education;Orthotic Fit/Training;Prosthetic Training;Manual techniques;Vestibular    PT Next Visit Plan  review prosthetic care (did she try antiperspirant?), review HEP from 8/23 at sink, discuss use of prosthesis and horseback riding; instruct in negotiating ramps, curbs & stairs with prosthesis    PT Home Exercise Plan  Access Code: [email or web address]     Consulted and Agree with Plan of Care  Patient       Patient will benefit from skilled therapeutic intervention in order to improve the following deficits and impairments:  Abnormal gait, Decreased activity tolerance, Decreased balance, Decreased endurance, Decreased mobility, Decreased strength, Dizziness, Impaired flexibility, Postural dysfunction, Prosthetic Dependency  Visit Diagnosis: Unsteadiness on feet  Other abnormalities of gait and mobility  Muscle weakness (generalized)     Problem List Patient Active Problem List   Diagnosis Date Noted  . Osteomyelitis, lower leg ([place]) 10/19/2017  . MSSA bacteremia 10/19/2017  . Left ankle pain 07/27/2017  . Osteoarthritis 07/27/2017  . Septic shock ([place]) 07/13/2017  . Shortness of breath 07/13/2017  . Bacteremia due to Klebsiella pneumoniae 07/13/2017  . Allergic reaction to drug 07/13/2017  . Uncontrolled type 2 diabetes mellitus with hyperglycemia ([place]) 07/12/2017  . Idiopathic peripheral neuropathy   . Osteomyelitis of right hand ([place])   . Osteomyelitis of finger of right hand ([place]) 06/08/2017  . MRSA infection   . 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 Controlled type 2 diabetes with neuropathy ([place]) 05/04/2016  . Open wound of plantar aspect of foot 01/13/2016  . Morbid (severe) obesity due to excess calories ([place]) 01/17/2015  . Deformity of  ankle and foot, acquired 12/06/2014  . Charcot's joint of foot, left 08/03/2014  . Hypersomnia 12/20/2013  . Obstructive sleep apnea syndrome, severe 12/20/2013  . Other fatigue 12/20/2013  . Snoring 12/20/2013  . Prosthetic joint infection ([place]) 01/31/2013  . Postoperative anemia due to acute blood loss 06/25/2011  . Osteoarthritis of knee 06/23/2011  . Obesity, morbid (more than 100 lbs over ideal weight or BMI > 40) ([facility]) 06/23/2011    Rexanne Mano, PT 12/18/2017, 9:01 AM  [facility] [address] [place], [place], [address] Phone: [phone]   Fax:  [phone]  Name: [name] MRN: [id number] Date of Birth: 1966-02-11

## 2017-12-21 ENCOUNTER — Ambulatory Visit: Payer: BLUE CROSS/BLUE SHIELD | Admitting: Physical Therapy

## 2017-12-23 ENCOUNTER — Ambulatory Visit: Payer: BLUE CROSS/BLUE SHIELD | Admitting: Physical Therapy

## 2017-12-23 ENCOUNTER — Encounter: Payer: Self-pay | Admitting: Physical Therapy

## 2017-12-23 DIAGNOSIS — R2689 Other abnormalities of gait and mobility: Secondary | ICD-10-CM

## 2017-12-23 DIAGNOSIS — R2681 Unsteadiness on feet: Secondary | ICD-10-CM | POA: Diagnosis not present

## 2017-12-23 DIAGNOSIS — R293 Abnormal posture: Secondary | ICD-10-CM

## 2017-12-23 DIAGNOSIS — M6281 Muscle weakness (generalized): Secondary | ICD-10-CM

## 2017-12-23 IMAGING — CT CT ABD-PELV W/ CM
2 of 5 series · 8 of 46 positions shown, 9 images · IV contrast (Iodine)
Comparison: None.

CLINICAL DATA: Lower abdominal pain starting [REDACTED], diarrhea,
nausea and vomiting

EXAM:
CT ABDOMEN AND PELVIS WITH CONTRAST
TECHNIQUE: Multidetector CT imaging of the abdomen and pelvis was performed
using the standard protocol following bolus administration of
intravenous contrast.
CONTRAST:  100mL [id number] IOPAMIDOL ([id number]) INJECTION 61%

[Series 201: routine, idose (2) · axial · 0.87mm/px · z∈[-884,-504]mm · 5 of 100 slices shown, 6 images]
[im 12/100  soft-tissue]
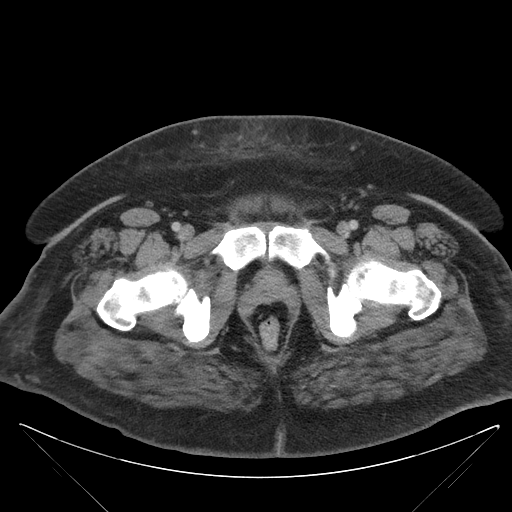
[im 12/100  bone]
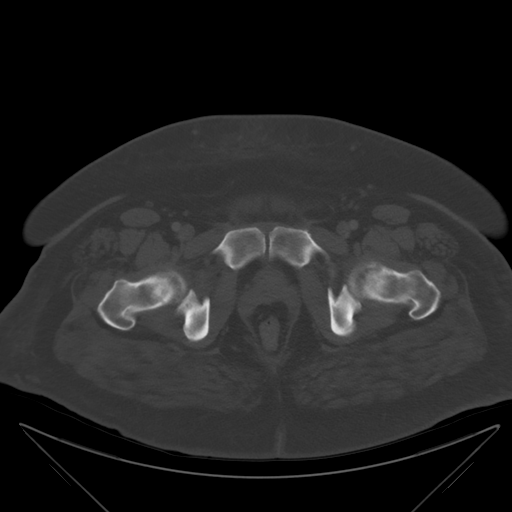
[im 30/100  soft-tissue]
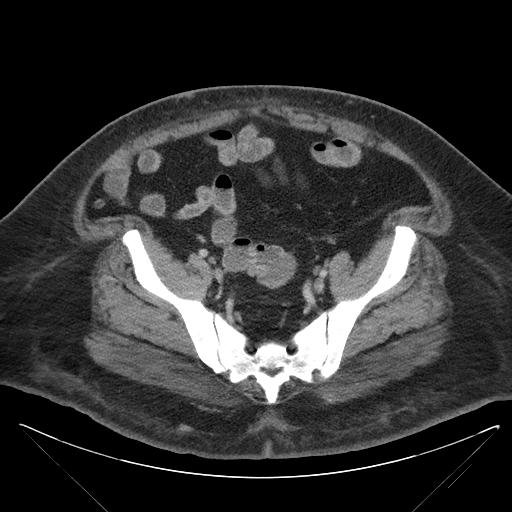
[im 53/100  soft-tissue]
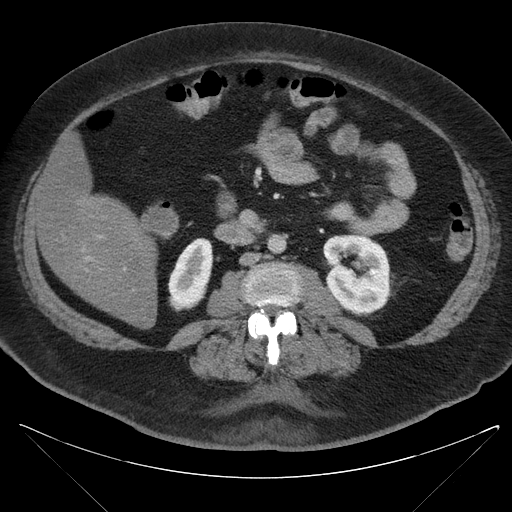
[im 70/100  soft-tissue]
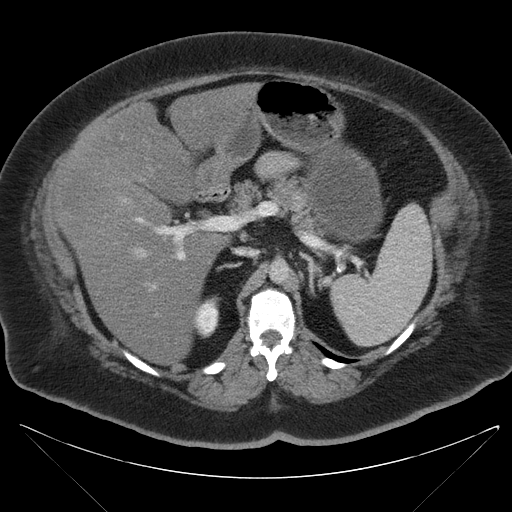
[im 88/100  soft-tissue]
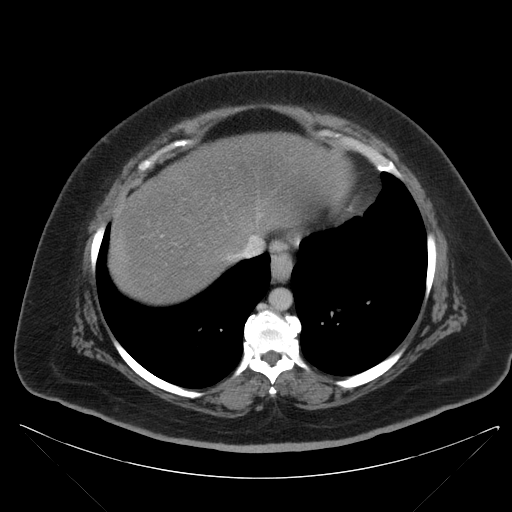

[Series 203: coronals, idose (2) · coronal · 0.45mm/px · 3 of 135 slices shown]
[im 45/135  soft-tissue]
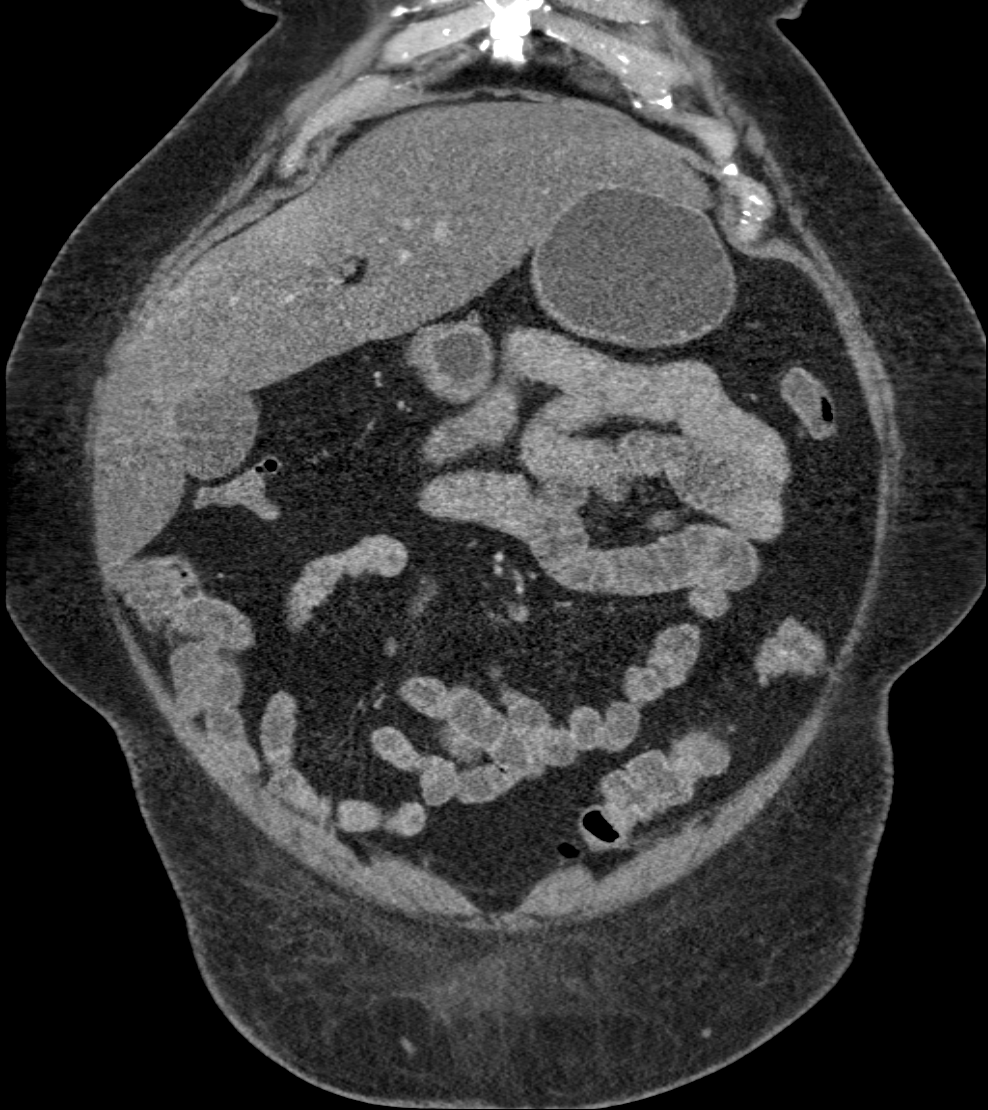
[im 60/135  soft-tissue]
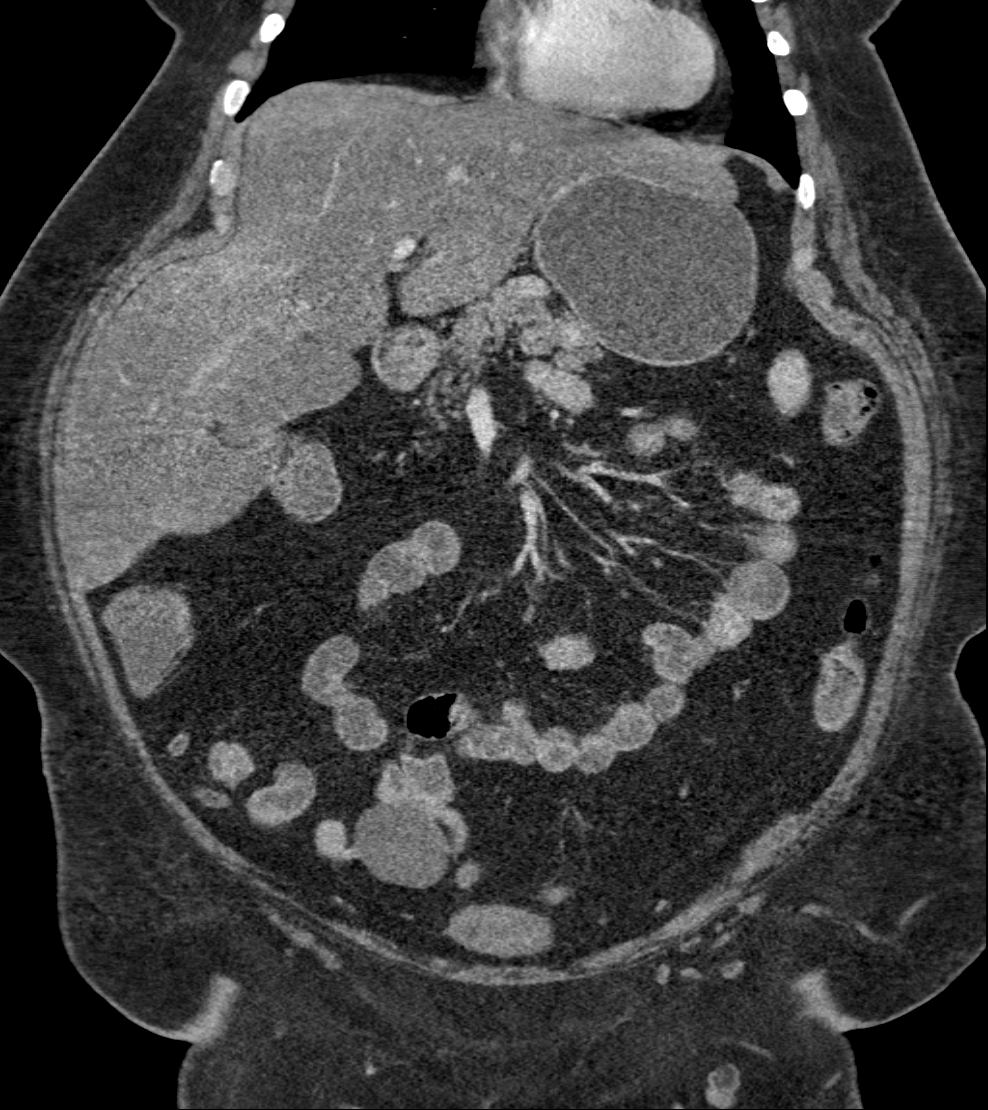
[im 75/135  soft-tissue]
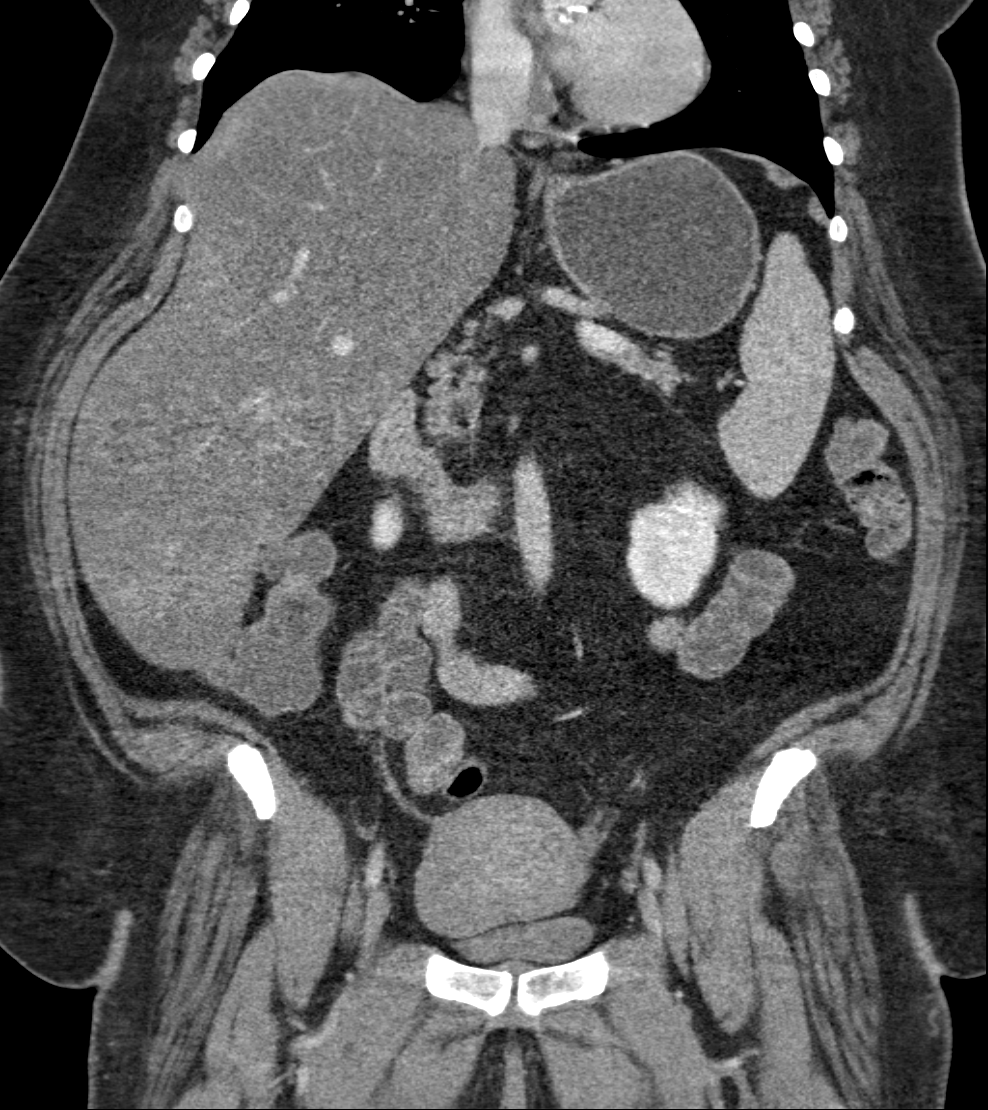

[8 of 46 positions shown; findings below may reference images not displayed]

FINDINGS: Lower chest: Lung bases are normal. There is a small hiatal hernia.
Thickening of distal esophageal wall. Gastroesophageal reflux cannot
be excluded. Clinical correlation is necessary P

Hepatobiliary: Fatty infiltration of the liver is noted. No
calcified gallstones are noted within gallbladder. No focal hepatic
mass.

Pancreas: No focal pancreatic abnormality. No evidence of acute
pancreatitis.

Spleen: Enhanced spleen is normal.

Adrenals/Urinary Tract: No adrenal gland mass. Enhanced kidneys are
symmetrical in size. No hydronephrosis or hydroureter.

Delayed renal images shows shows bilateral mild delay excretion.
Please correlate with renal function test.

Stomach/Bowel: No gastric outlet obstruction. Mild fluid distended
distal small bowel loops in mid abdomen and pelvis. Enteritis cannot
be excluded. There is no transition point in caliber of small bowel.
Less likely ileus or early bowel obstruction. Some colonic gas and
fluid noted within right colon and transverse colon. Diarrhea cannot
be excluded. Normal appendix partially visualized in axial image 69.
Measures 4.4 mm in diameter. No pericecal inflammation. Some liquid
stool noted within sigmoid colon and rectum. Diarrhea cannot be
excluded.

Vascular/Lymphatic: No aortic aneurysm.  No adenopathy.

Reproductive: There is a low-lying IUD within uterus. There is
question of a left fundal submucosal fibroid measure about 3 cm
axial image 76. Probable fundal fibroid sagittal image 84 measures
1.3 cm. There might be a right fundal fibroid measures about 3 cm.
Correlation with pelvic ultrasound MRI in GYN exam is recommended.

Other: There is a right ovarian cyst measures 3.6 cm. No adnexal
mass is noted.

Musculoskeletal: No destructive bony lesions are noted. There are
degenerative changes thoracolumbar spine.
IMPRESSION: 1. Small hiatal hernia. There is thickening of distal esophageal
wall. Gastroesophageal reflux cannot be excluded.
2. Significant fatty infiltration of the liver.
3. Question mild bilateral delayed renal excretion. Please correlate
with renal function test.
4. Minimal fluid distended distal small bowel loops. Findings
suspicious for enteritis. Less likely ileus or early bowel
obstruction. Some liquid stool and gas noted within right colon and
transverse colon. Diarrhea cannot be excluded. Normal appendix
partially visualized. No pericecal inflammation. No evidence of
colitis.
5. Low lying IUD within uterus. Question myometrial fibroids within
uterus. Correlation with GYN exam and further correlation with
pelvic ultrasound or MRI could be performed.
6. There is a right ovarian cyst measures 3.6 cm.

## 2017-12-24 ENCOUNTER — Ambulatory Visit: Payer: BLUE CROSS/BLUE SHIELD | Admitting: Physical Therapy

## 2017-12-24 ENCOUNTER — Encounter: Payer: Self-pay | Admitting: Physical Therapy

## 2017-12-24 DIAGNOSIS — R2681 Unsteadiness on feet: Secondary | ICD-10-CM

## 2017-12-24 DIAGNOSIS — R2689 Other abnormalities of gait and mobility: Secondary | ICD-10-CM

## 2017-12-24 DIAGNOSIS — M6281 Muscle weakness (generalized): Secondary | ICD-10-CM

## 2017-12-24 NOTE — Therapy (Signed)
[facility] [address] [place], [place], [address] Phone: [phone]   Fax:  [phone]  Physical Therapy Treatment  Patient Details  Name: [name] MRN: [id number] Date of Birth: 19-Jul-1965 Referring Provider: Rolena Infante, MD   Encounter Date: 12/24/2017  PT End of Session - 12/24/17 0814    Visit Number  4    Number of Visits  20    Date for PT Re-Evaluation  02/18/18    Authorization Type  [employer] $700 deductible & $2500 oop max met; pt covered 100%, visit limit - PT, OT, chiropractore 30 combined; zero used    Authorization - Visit Number  4    Authorization - Number of Visits  30    PT Start Time  0808   pt late for appt today   PT Stop Time  0847    PT Time Calculation (min)  39 min    Equipment Utilized During Treatment  Gait belt    Activity Tolerance  Patient tolerated treatment well;No increased pain    Behavior During Therapy  WFL for tasks assessed/performed       Past Medical History:  Diagnosis Date  . Arthritis   . Diabetes mellitus without complication ([place])   . DOE (dyspnea on exertion) 07/12/2017  . FUO (fever of unknown origin) 07/12/2017  . Gastroenteritis 04/2016  . Headache(784.0)    hormonal headache  . Low back pain 07/12/2017  . Neuropathy    bilateral feet  . Obese   . Pleuritic chest pain 07/12/2017  . Pneumonia    hx 20+ years ago  . Sleep apnea    bipap 1 yr  . Ulcer of foot ([place])    toe ulcer greater than a year ago    Past Surgical History:  Procedure Laterality Date  . AMPUTATION Right 06/11/2017   Procedure: RIGHT LONG FINGER AMPUTATION DIGIT;  Surgeon: Milly Jakob, MD;  Location: [facility];  Service: Orthopedics;  Laterality: Right;  . ANKLE FUSION Left 08/03/2014   Procedure: Left Tibiocalcaneal Fusion;  Surgeon: Newt Minion, MD;  Location: [facility];  Service: Orthopedics;  Laterality: Left;  . APPLICATION OF WOUND VAC Left 08/03/2014   Procedure: APPLICATION OF  WOUND VAC;  Surgeon: Newt Minion, MD;  Location: [facility];  Service: Orthopedics;  Laterality: Left;  . I&D KNEE WITH POLY EXCHANGE Right 01/31/2013   Procedure: IRRIGATION AND DEBRIDEMENT KNEE WITH POLY EXCHANGE possible Antibiotic Spacer;  Surgeon: Garald Balding, MD;  Location: [facility];  Service: Orthopedics;  Laterality: Right;  . INCISION AND DRAINAGE Right 01/31/2013   ANTIBIOTIC SPACER  RIGHT KNEE  DR Durward Fortes   . KNEE ARTHROSCOPY     left knee  . ORIF ANKLE FRACTURE Left 03/16/2014   Procedure: Foot Excision Charcot Collapse,  Internal Fixation;  Surgeon: Newt Minion, MD;  Location: [facility];  Service: Orthopedics;  Laterality: Left;  . REPLACEMENT TOTAL KNEE     left  . TOTAL KNEE ARTHROPLASTY  06/23/2011   Procedure: TOTAL KNEE ARTHROPLASTY;  Surgeon: Garald Balding, MD;  Location: [facility];  Service: Orthopedics;  Laterality: Right;  RIGHT TOTAL KNEE REPLACEMENT     There were no vitals filed for this visit.  Subjective Assessment - 12/24/17 0813    Subjective  Having some pain on the ant tib area of residual limb. Can not increase sock ply anymore as prosthesis will not go on. Sore today from all the activity yesterday.  Pertinent History  L TTA, L TKA 2007, R TKA 2012, DM2, neuropathy, gout, OA, morbid obesity,     Limitations  Lifting;Standing;Walking;House hold activities    Patient Stated Goals  to use prosthesis walk outdoors, hiking, ride horse,     Currently in Pain?  No/denies            [facility] Adult PT Treatment/Exercise - 12/24/17 0815      Transfers   Transfers  Sit to Stand;Stand to Sit    Sit to Stand  5: Supervision;Without upper extremity assist;From chair/3-in-1    Stand to Sit  5: Supervision;Without upper extremity assist;To chair/3-in-1      Ambulation/Gait   Ambulation/Gait  Yes    Ambulation/Gait Assistance  5: Supervision;4: Min guard    Ambulation/Gait Assistance Details  transitioned to single crutch with session today. cues needed on  posture, sequencing, and weight shifting onto prosthesis. no balance issues noted.     Ambulation Distance (Feet)  100 Feet   x2 in/out of gym, 230 x2 single crutch   Assistive device  R Forearm Crutch;Lofstrands    Gait Pattern  Step-through pattern;Decreased stride length;Decreased stance time - left;Decreased weight shift to left    Ambulation Surface  Level;Indoor      Prosthetics   Prosthetic Care Comments   educated pt on use of cut off sock for increased comfort at tibial area and use of barrier sock at top for sweat management. also discussed water seal bag for use with swimming, various ways to get in/out of pool with and without prosthesis donned. Pt plans to look into resuming aquatic exercise classes.      Current prosthetic wear tolerance (days/week)   daily     Current prosthetic wear tolerance (#hours/day)   4 hours on, 1 hour off rotation    Residual limb condition   intact with no issues    Education Provided  Correct ply sock adjustment;Residual limb care;Proper wear schedule/adjustment;Proper weight-bearing schedule/adjustment;Other (comment)   see prosthetic comments above   Person(s) Educated  Patient    Education Method  Explanation;Demonstration;Verbal cues    Education Method  Verbalized understanding;Returned demonstration;Needs further instruction;Verbal cues required    Donning Prosthesis  Supervision    Doffing Prosthesis  Supervision           PT Short Term Goals - 12/16/17 2149      PT SHORT TERM GOAL #1   Title  patient demonstrates proper donning & verbalizes proper cleaning.  (All STGs Target Date: 01/14/2018)    Time  1    Period  Months    Status  New    Target Date  01/14/18      PT SHORT TERM GOAL #2   Title  Patient tolerates daily prosthesis wear >10 hrs total per day without skin or limb pain issues.     Time  1    Period  Months    Status  New    Target Date  01/14/18      PT SHORT TERM GOAL #3   Title  Patient ambulates 500'  including grass with cane & prosthesis with supervision.     Time  1    Period  Months    Status  New    Target Date  01/14/18      PT SHORT TERM GOAL #4   Title  Patient negotiates ramps, curbs & stairs 1 rail with cane & prosthesis with supervision.     Time  1  Period  Months    Status  New    Target Date  01/14/18      PT SHORT TERM GOAL #5   Title  Patient reaches 10" and picks up objects from floor without loss of balance with supervision.     Time  1    Period  Months    Status  New    Target Date  01/14/18        PT Long Term Goals - 12/16/17 2141      PT LONG TERM GOAL #1   Title  Patient demonstrates & verbalizes proper prosthetic care to enable safe prosthesis use. (All LTGs Target Date: 02/18/2018)    Time  10    Period  Weeks    Status  New    Target Date  02/18/18      PT LONG TERM GOAL #2   Title  Patient tolerates daily prosthesis wear >90% of awake hours without skin issues or limb pain to enable function throughout her day.     Time  10    Period  Weeks    Status  New    Target Date  02/18/18      PT LONG TERM GOAL #3   Title  Berg Balance >/= 45 /56  to indicate lower fall risk    Time  10    Period  Weeks    Status  New    Target Date  02/18/18      PT LONG TERM GOAL #4   Title  Patient ambulates 1000' including grass with LRAD & prosthesis modified independent to enable mobility on farm.     Time  10    Period  Weeks    Status  New    Target Date  02/18/18      PT LONG TERM GOAL #5   Title  Patient negotiates ramps, curbs & stairs with LRAD & prosthesis modified independent for community access.     Time  10    Period  Weeks    Status  New    Target Date  02/18/18      Additional Long Term Goals   Additional Long Term Goals  Yes      PT LONG TERM GOAL #6   Title  Patient ambulates 200' with cane or less carrying 10# with prosthesis modified independent.    Time  10    Period  Weeks    Status  New    Target Date  02/18/18       PT LONG TERM GOAL #7   Title  Patient verbalizes recommendations for mounting/dismounting her horses to achieve her goal.     Time  10    Period  Weeks    Status  New    Target Date  02/18/18            Plan - 12/24/17 3159    Clinical Impression Statement  Today's skilled session continued to focus on prosthetic education and progressed gait to single forearm crutch without any issues reported. Pt is making great progress toward goals and should benefit from continued PT to progress toward unmet goals.     Rehab Potential  Good    Clinical Impairments Affecting Rehab Potential  high fall risk, gait dependency, unknowledgeable in safe prosthesis use/care    PT Frequency  2x / week    PT Duration  Other (comment)   10 weeks   PT Treatment/Interventions  ADLs/Self Care Home  Management;Canalith Repostioning;Gait training;DME Instruction;Stair training;Functional mobility training;Therapeutic activities;Therapeutic exercise;Balance training;Neuromuscular re-education;Patient/family education;Orthotic Fit/Training;Prosthetic Training;Manual techniques;Vestibular    PT Next Visit Plan  continue to go over prosthetic care as new concepts arise, gait with single crutch, barriers with single crutch. Pt sees prosthetist next Wed (12/29/17) and hopes to get right foot/ankle brace reinforced as it stil allows ankle to roll, if she does, outside with single crutch.     PT Home Exercise Plan  Access Code: [email or web address]     Consulted and Agree with Plan of Care  Patient       Patient will benefit from skilled therapeutic intervention in order to improve the following deficits and impairments:  Abnormal gait, Decreased activity tolerance, Decreased balance, Decreased endurance, Decreased mobility, Decreased strength, Dizziness, Impaired flexibility, Postural dysfunction, Prosthetic Dependency  Visit Diagnosis: Unsteadiness on feet  Other abnormalities of gait and mobility  Muscle weakness  (generalized)     Problem List Patient Active Problem List   Diagnosis Date Noted  . Osteomyelitis, lower leg ([place]) 10/19/2017  . MSSA bacteremia 10/19/2017  . Left ankle pain 07/27/2017  . Osteoarthritis 07/27/2017  . Septic shock ([place]) 07/13/2017  . Shortness of breath 07/13/2017  . Bacteremia due to Klebsiella pneumoniae 07/13/2017  . Allergic reaction to drug 07/13/2017  . Uncontrolled type 2 diabetes mellitus with hyperglycemia ([place]) 07/12/2017  . Idiopathic peripheral neuropathy   . Osteomyelitis of right hand ([place])   . Osteomyelitis of finger of right hand ([place]) 06/08/2017  . MRSA infection   . 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 Controlled type 2 diabetes with neuropathy ([place]) 05/04/2016  . Open wound of plantar aspect of foot 01/13/2016  . Morbid (severe) obesity due to excess calories ([place]) 01/17/2015  . Deformity of ankle and foot, acquired 12/06/2014  . Charcot's joint of foot, left 08/03/2014  . Hypersomnia 12/20/2013  . Obstructive sleep apnea syndrome, severe 12/20/2013  . Other fatigue 12/20/2013  . Snoring 12/20/2013  . Prosthetic joint infection ([place]) 01/31/2013  . Postoperative anemia due to acute blood loss 06/25/2011  . Osteoarthritis of knee 06/23/2011  . Obesity, morbid (more than 100 lbs over ideal weight or BMI > 40) ([facility]) 06/23/2011    Willow Ora, PTA, [facility] Outpatient Neuro [facility] [address], [place], [place] [address] [phone] 12/24/17, 10:15 AM   Name: [name] MRN: [id number] Date of Birth: [birth year]

## 2017-12-24 NOTE — Therapy (Signed)
[facility] [address] [place], [place], [address] Phone: [phone]   Fax:  [phone]  Physical Therapy Treatment  Patient Details  Name: [name] MRN: [id number] Date of Birth: Aug 20, 1965 Referring Provider: Rolena Infante, MD   Encounter Date: 12/23/2017  PT End of Session - 12/23/17 1408    Visit Number  3    Number of Visits  20    Date for PT Re-Evaluation  02/18/18    Authorization Type  [employer] $700 deductible & $2500 oop max met; pt covered 100%, visit limit - PT, OT, chiropractore 30 combined; zero used    Authorization - Visit Number  3    Authorization - Number of Visits  30    PT Start Time  1404    PT Stop Time  1445    PT Time Calculation (min)  41 min    Equipment Utilized During Treatment  Gait belt    Activity Tolerance  Patient tolerated treatment well    Behavior During Therapy  WFL for tasks assessed/performed       Past Medical History:  Diagnosis Date  . Arthritis   . Diabetes mellitus without complication ([place])   . DOE (dyspnea on exertion) 07/12/2017  . FUO (fever of unknown origin) 07/12/2017  . Gastroenteritis 04/2016  . Headache(784.0)    hormonal headache  . Low back pain 07/12/2017  . Neuropathy    bilateral feet  . Obese   . Pleuritic chest pain 07/12/2017  . Pneumonia    hx 20+ years ago  . Sleep apnea    bipap 1 yr  . Ulcer of foot ([place])    toe ulcer greater than a year ago    Past Surgical History:  Procedure Laterality Date  . AMPUTATION Right 06/11/2017   Procedure: RIGHT LONG FINGER AMPUTATION DIGIT;  Surgeon: Milly Jakob, MD;  Location: [facility];  Service: Orthopedics;  Laterality: Right;  . ANKLE FUSION Left 08/03/2014   Procedure: Left Tibiocalcaneal Fusion;  Surgeon: Newt Minion, MD;  Location: [facility];  Service: Orthopedics;  Laterality: Left;  . APPLICATION OF WOUND VAC Left 08/03/2014   Procedure: APPLICATION OF WOUND VAC;  Surgeon: Newt Minion, MD;   Location: [facility];  Service: Orthopedics;  Laterality: Left;  . I&D KNEE WITH POLY EXCHANGE Right 01/31/2013   Procedure: IRRIGATION AND DEBRIDEMENT KNEE WITH POLY EXCHANGE possible Antibiotic Spacer;  Surgeon: Garald Balding, MD;  Location: [facility];  Service: Orthopedics;  Laterality: Right;  . INCISION AND DRAINAGE Right 01/31/2013   ANTIBIOTIC SPACER  RIGHT KNEE  DR Durward Fortes   . KNEE ARTHROSCOPY     left knee  . ORIF ANKLE FRACTURE Left 03/16/2014   Procedure: Foot Excision Charcot Collapse,  Internal Fixation;  Surgeon: Newt Minion, MD;  Location: [facility];  Service: Orthopedics;  Laterality: Left;  . REPLACEMENT TOTAL KNEE     left  . TOTAL KNEE ARTHROPLASTY  06/23/2011   Procedure: TOTAL KNEE ARTHROPLASTY;  Surgeon: Garald Balding, MD;  Location: [facility];  Service: Orthopedics;  Laterality: Right;  RIGHT TOTAL KNEE REPLACEMENT     There were no vitals filed for this visit.  Subjective Assessment - 12/23/17 1406    Subjective  No new complaints. No falls. Sore from riding horse this am with prosthesis on. No issues with use of prosthesis    Pertinent History  L TTA, L TKA 2007, R TKA 2012, DM2, neuropathy, gout, OA, morbid obesity,  Limitations  Lifting;Standing;Walking;House hold activities    Patient Stated Goals  to use prosthesis walk outdoors, hiking, ride horse,     Currently in Pain?  No/denies       [facility] Adult PT Treatment/Exercise - 12/23/17 1409      Transfers   Transfers  Sit to Stand;Stand to Sit    Sit to Stand  5: Supervision;Without upper extremity assist;From chair/3-in-1    Stand to Sit  5: Supervision;Without upper extremity assist;To chair/3-in-1      Ambulation/Gait   Ambulation/Gait  Yes    Ambulation/Gait Assistance  5: Supervision    Ambulation/Gait Assistance Details  cues on posture and weight shifting onto prosthesis in stance    Ambulation Distance (Feet)  150 Feet   x1, plus around gym with activities   Assistive device  Lofstrands;Prosthesis     Gait Pattern  Step-through pattern;Decreased stride length;Decreased stance time - left;Decreased weight shift to left    Ambulation Surface  Level;Indoor    Stairs  Yes    Stairs Assistance  4: Min guard;5: Supervision    Stairs Assistance Details (indicate cue type and reason)  1st rep with 2 rails, 2cd/3rd rep with rail/crutch combo. cues needed on sequencing, weight shifting and crutch placement.     Stair Management Technique  One rail Right;One rail Left;Two rails;Alternating pattern;Step to pattern;Forwards;With crutches    Number of Stairs  4    Height of Stairs  6    Ramp  Other (comment)   min guard assist   Ramp Details (indicate cue type and reason)  with cructhes/prosthesis: cues on sequencing, technique, weight shifting and walker position.  performed x 3-4 reps with decreased assist/cues as reps progressed      Prosthetics   Prosthetic Care Comments   pt to change to wearing for 4 hours on, off for 1 hour rotation. reinforced drying every 2 hours.     Current prosthetic wear tolerance (days/week)   daily     Current prosthetic wear tolerance (#hours/day)   wore 2 hrs, 3x/day with 2 hr rest between    Education Provided  Correct ply sock adjustment;Residual limb care    Person(s) Educated  Patient    Education Method  Explanation;Demonstration;Verbal cues    Education Method  Verbalized understanding;Returned demonstration;Verbal cues required;Needs further instruction    Donning Prosthesis  Supervision    Doffing Prosthesis  Supervision             PT Short Term Goals - 12/16/17 2149      PT SHORT TERM GOAL #1   Title  patient demonstrates proper donning & verbalizes proper cleaning.  (All STGs Target Date: 01/14/2018)    Time  1    Period  Months    Status  New    Target Date  01/14/18      PT SHORT TERM GOAL #2   Title  Patient tolerates daily prosthesis wear >10 hrs total per day without skin or limb pain issues.     Time  1    Period  Months    Status   New    Target Date  01/14/18      PT SHORT TERM GOAL #3   Title  Patient ambulates 500' including grass with cane & prosthesis with supervision.     Time  1    Period  Months    Status  New    Target Date  01/14/18      PT SHORT TERM GOAL #  4   Title  Patient negotiates ramps, curbs & stairs 1 rail with cane & prosthesis with supervision.     Time  1    Period  Months    Status  New    Target Date  01/14/18      PT SHORT TERM GOAL #5   Title  Patient reaches 10" and picks up objects from floor without loss of balance with supervision.     Time  1    Period  Months    Status  New    Target Date  01/14/18        PT Long Term Goals - 12/16/17 2141      PT LONG TERM GOAL #1   Title  Patient demonstrates & verbalizes proper prosthetic care to enable safe prosthesis use. (All LTGs Target Date: 02/18/2018)    Time  10    Period  Weeks    Status  New    Target Date  02/18/18      PT LONG TERM GOAL #2   Title  Patient tolerates daily prosthesis wear >90% of awake hours without skin issues or limb pain to enable function throughout her day.     Time  10    Period  Weeks    Status  New    Target Date  02/18/18      PT LONG TERM GOAL #3   Title  Berg Balance >/= 45 /56  to indicate lower fall risk    Time  10    Period  Weeks    Status  New    Target Date  02/18/18      PT LONG TERM GOAL #4   Title  Patient ambulates 1000' including grass with LRAD & prosthesis modified independent to enable mobility on farm.     Time  10    Period  Weeks    Status  New    Target Date  02/18/18      PT LONG TERM GOAL #5   Title  Patient negotiates ramps, curbs & stairs with LRAD & prosthesis modified independent for community access.     Time  10    Period  Weeks    Status  New    Target Date  02/18/18      Additional Long Term Goals   Additional Long Term Goals  Yes      PT LONG TERM GOAL #6   Title  Patient ambulates 200' with cane or less carrying 10# with prosthesis modified  independent.    Time  10    Period  Weeks    Status  New    Target Date  02/18/18      PT LONG TERM GOAL #7   Title  Patient verbalizes recommendations for mounting/dismounting her horses to achieve her goal.     Time  10    Period  Weeks    Status  New    Target Date  02/18/18            Plan - 12/23/17 1409    Clinical Impression Statement  Today's skilled session continued to focus on prosthetic education, issued sink program for balance/proprioception and barriers with prosthesis/crutches. Pt is progressing toward goals and should benefit from continued PT to progress toward unmet goals.     Rehab Potential  Good    Clinical Impairments Affecting Rehab Potential  high fall risk, gait dependency, unknowledgeable in safe prosthesis use/care    PT  Frequency  2x / week    PT Duration  Other (comment)   10 weeks   PT Treatment/Interventions  ADLs/Self Care Home Management;Canalith Repostioning;Gait training;DME Instruction;Stair training;Functional mobility training;Therapeutic activities;Therapeutic exercise;Balance training;Neuromuscular re-education;Patient/family education;Orthotic Fit/Training;Prosthetic Training;Manual techniques;Vestibular    PT Next Visit Plan  continue to go over prosthetic care as new concepts arise, gait with single crutch    PT Home Exercise Plan  Access Code: [email or web address]     Consulted and Agree with Plan of Care  Patient       Patient will benefit from skilled therapeutic intervention in order to improve the following deficits and impairments:  Abnormal gait, Decreased activity tolerance, Decreased balance, Decreased endurance, Decreased mobility, Decreased strength, Dizziness, Impaired flexibility, Postural dysfunction, Prosthetic Dependency  Visit Diagnosis: Unsteadiness on feet  Other abnormalities of gait and mobility  Muscle weakness (generalized)  Abnormal posture     Problem List Patient Active Problem List   Diagnosis Date Noted  .  Osteomyelitis, lower leg ([place]) 10/19/2017  . MSSA bacteremia 10/19/2017  . Left ankle pain 07/27/2017  . Osteoarthritis 07/27/2017  . Septic shock ([place]) 07/13/2017  . Shortness of breath 07/13/2017  . Bacteremia due to Klebsiella pneumoniae 07/13/2017  . Allergic reaction to drug 07/13/2017  . Uncontrolled type 2 diabetes mellitus with hyperglycemia ([place]) 07/12/2017  . Idiopathic peripheral neuropathy   . Osteomyelitis of right hand ([place])   . Osteomyelitis of finger of right hand ([place]) 06/08/2017  . MRSA infection   . 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 Controlled type 2 diabetes with neuropathy ([place]) 05/04/2016  . Open wound of plantar aspect of foot 01/13/2016  . Morbid (severe) obesity due to excess calories ([place]) 01/17/2015  . Deformity of ankle and foot, acquired 12/06/2014  . Charcot's joint of foot, left 08/03/2014  . Hypersomnia 12/20/2013  . Obstructive sleep apnea syndrome, severe 12/20/2013  . Other fatigue 12/20/2013  . Snoring 12/20/2013  . Prosthetic joint infection ([place]) 01/31/2013  . Postoperative anemia due to acute blood loss 06/25/2011  . Osteoarthritis of knee 06/23/2011  . Obesity, morbid (more than 100 lbs over ideal weight or BMI > 40) ([facility]) 06/23/2011    Willow Ora, PTA, [facility] Outpatient Neuro [facility] [address], [place], [place] [address] [phone] 12/24/17, 8:03 AM   Name: Fidela Cieslak Dombkowski MRN: [id number] Date of Birth: Apr 26, 1966

## 2017-12-29 ENCOUNTER — Encounter: Payer: Self-pay | Admitting: Physical Therapy

## 2017-12-29 ENCOUNTER — Ambulatory Visit: Payer: BLUE CROSS/BLUE SHIELD | Attending: Physical Medicine & Rehabilitation | Admitting: Physical Therapy

## 2017-12-29 DIAGNOSIS — R296 Repeated falls: Secondary | ICD-10-CM | POA: Insufficient documentation

## 2017-12-29 DIAGNOSIS — R2681 Unsteadiness on feet: Secondary | ICD-10-CM | POA: Diagnosis not present

## 2017-12-29 DIAGNOSIS — M6281 Muscle weakness (generalized): Secondary | ICD-10-CM | POA: Diagnosis present

## 2017-12-29 DIAGNOSIS — R2689 Other abnormalities of gait and mobility: Secondary | ICD-10-CM | POA: Diagnosis present

## 2017-12-29 DIAGNOSIS — R293 Abnormal posture: Secondary | ICD-10-CM | POA: Diagnosis present

## 2017-12-29 NOTE — Therapy (Signed)
[facility] [address] [place], [place], [address] Phone: [phone]   Fax:  [phone]  Physical Therapy Treatment  Patient Details  Name: [name] MRN: [id number] Date of Birth: Mar 07, 1966 Referring Provider: Rolena Infante, MD   Encounter Date: 12/29/2017  PT End of Session - 12/29/17 1247    Visit Number  5    Number of Visits  20    Date for PT Re-Evaluation  02/18/18    Authorization Type  [employer] $700 deductible & $2500 oop max met; pt covered 100%, visit limit - PT, OT, chiropractore 30 combined; zero used    Authorization - Visit Number  4    Authorization - Number of Visits  30    PT Start Time  1147    PT Stop Time  1230    PT Time Calculation (min)  43 min    Equipment Utilized During Treatment  Gait belt    Activity Tolerance  Patient tolerated treatment well;No increased pain    Behavior During Therapy  WFL for tasks assessed/performed       Past Medical History:  Diagnosis Date  . Arthritis   . Diabetes mellitus without complication ([place])   . DOE (dyspnea on exertion) 07/12/2017  . FUO (fever of unknown origin) 07/12/2017  . Gastroenteritis 04/2016  . Headache(784.0)    hormonal headache  . Low back pain 07/12/2017  . Neuropathy    bilateral feet  . Obese   . Pleuritic chest pain 07/12/2017  . Pneumonia    hx 20+ years ago  . Sleep apnea    bipap 1 yr  . Ulcer of foot ([place])    toe ulcer greater than a year ago    Past Surgical History:  Procedure Laterality Date  . AMPUTATION Right 06/11/2017   Procedure: RIGHT LONG FINGER AMPUTATION DIGIT;  Surgeon: Milly Jakob, MD;  Location: [facility];  Service: Orthopedics;  Laterality: Right;  . ANKLE FUSION Left 08/03/2014   Procedure: Left Tibiocalcaneal Fusion;  Surgeon: Newt Minion, MD;  Location: [facility];  Service: Orthopedics;  Laterality: Left;  . APPLICATION OF WOUND VAC Left 08/03/2014   Procedure: APPLICATION OF WOUND VAC;  Surgeon: Newt Minion, MD;  Location: [facility];  Service: Orthopedics;  Laterality: Left;  . I&D KNEE WITH POLY EXCHANGE Right 01/31/2013   Procedure: IRRIGATION AND DEBRIDEMENT KNEE WITH POLY EXCHANGE possible Antibiotic Spacer;  Surgeon: Garald Balding, MD;  Location: [facility];  Service: Orthopedics;  Laterality: Right;  . INCISION AND DRAINAGE Right 01/31/2013   ANTIBIOTIC SPACER  RIGHT KNEE  DR Durward Fortes   . KNEE ARTHROSCOPY     left knee  . ORIF ANKLE FRACTURE Left 03/16/2014   Procedure: Foot Excision Charcot Collapse,  Internal Fixation;  Surgeon: Newt Minion, MD;  Location: [facility];  Service: Orthopedics;  Laterality: Left;  . REPLACEMENT TOTAL KNEE     left  . TOTAL KNEE ARTHROPLASTY  06/23/2011   Procedure: TOTAL KNEE ARTHROPLASTY;  Surgeon: Garald Balding, MD;  Location: [facility];  Service: Orthopedics;  Laterality: Right;  RIGHT TOTAL KNEE REPLACEMENT     There were no vitals filed for this visit.  Subjective Assessment - 12/29/17 1151    Subjective  Went to prosthetist for pads this morning.  Pt has been practising amb. with one crutch in the home and on level outdoor surfaces.    Pertinent History  L TTA, L TKA 2007, R TKA 2012, DM2,  neuropathy, gout, OA, morbid obesity,     Limitations  Lifting;Standing;Walking;House hold activities    Patient Stated Goals  to use prosthesis walk outdoors, hiking, ride horse,     Currently in Pain?  No/denies                       [facility] Adult PT Treatment/Exercise - 12/29/17 0001      Transfers   Transfers  Sit to Stand;Stand to Sit    Sit to Stand  5: Supervision;Without upper extremity assist    Stand to Sit  Without upper extremity assist;5: Supervision      Ambulation/Gait   Ambulation/Gait  Yes    Ambulation/Gait Assistance  5: Supervision;4: Min guard    Ambulation/Gait Assistance Details  balance with R crutch    Ambulation Distance (Feet)  250 Feet    Assistive device  R Forearm Crutch    Gait Pattern  Step-through  pattern;Decreased stride length;Decreased stance time - left;Decreased weight shift to left    Ambulation Surface  Unlevel;Outdoor;Indoor;Paved;Gravel;Grass;Level    Stairs  Yes    Stairs Assistance  5: Supervision    Stairs Assistance Details (indicate cue type and reason)  initially with 1 crutch and rail then with one rail only    Stair Management Technique  One rail Right;Alternating pattern;Other (comment)   one rail and one crutch, then with 1 rail only.   Number of Stairs  8    Height of Stairs  6    Ramp  5: Supervision    Ramp Details (indicate cue type and reason)  with R crutch only, step through patttern ambulating up and down.      Prosthetics   Prosthetic Care Comments   pt just had tibia and calf pads placed this morning.    Current prosthetic wear tolerance (days/week)   daily     Current prosthetic wear tolerance (#hours/day)   4 hours on, 1 hour off rotation (2-3 rotations)    Residual limb condition   intact with no issues    Education Provided  Correct ply sock adjustment;Residual limb care;Proper wear schedule/adjustment;Proper weight-bearing schedule/adjustment;Other (comment)    Person(s) Educated  Patient    Education Method  Explanation    Education Method  Verbalized understanding    Doffing Prosthesis  Minimal assist   pt had trouble releasing pin         Balance Exercises - 12/29/17 1243      Balance Exercises: Standing   Standing Eyes Opened  Wide (BOA);4 reps   upper trunk rotations without UE support.   Retro Gait  4 reps;Other (comment)   with R crutch; min guard   Sidestepping  4 reps;Other (comment)   with right crutch.   Turning  Both;10 reps   figure 8 turns, progressing with visual scanning, with R crutch   Other Standing Exercises  ambulating with R crutch, picking objects of the floor, min guard for balance          PT Short Term Goals - 12/16/17 2149      PT SHORT TERM GOAL #1   Title  patient demonstrates proper donning &  verbalizes proper cleaning.  (All STGs Target Date: 01/14/2018)    Time  1    Period  Months    Status  New    Target Date  01/14/18      PT SHORT TERM GOAL #2   Title  Patient tolerates daily prosthesis wear >10 hrs  total per day without skin or limb pain issues.     Time  1    Period  Months    Status  New    Target Date  01/14/18      PT SHORT TERM GOAL #3   Title  Patient ambulates 500' including grass with cane & prosthesis with supervision.     Time  1    Period  Months    Status  New    Target Date  01/14/18      PT SHORT TERM GOAL #4   Title  Patient negotiates ramps, curbs & stairs 1 rail with cane & prosthesis with supervision.     Time  1    Period  Months    Status  New    Target Date  01/14/18      PT SHORT TERM GOAL #5   Title  Patient reaches 10" and picks up objects from floor without loss of balance with supervision.     Time  1    Period  Months    Status  New    Target Date  01/14/18        PT Long Term Goals - 12/16/17 2141      PT LONG TERM GOAL #1   Title  Patient demonstrates & verbalizes proper prosthetic care to enable safe prosthesis use. (All LTGs Target Date: 02/18/2018)    Time  10    Period  Weeks    Status  New    Target Date  02/18/18      PT LONG TERM GOAL #2   Title  Patient tolerates daily prosthesis wear >90% of awake hours without skin issues or limb pain to enable function throughout her day.     Time  10    Period  Weeks    Status  New    Target Date  02/18/18      PT LONG TERM GOAL #3   Title  Berg Balance >/= 45 /56  to indicate lower fall risk    Time  10    Period  Weeks    Status  New    Target Date  02/18/18      PT LONG TERM GOAL #4   Title  Patient ambulates 1000' including grass with LRAD & prosthesis modified independent to enable mobility on farm.     Time  10    Period  Weeks    Status  New    Target Date  02/18/18      PT LONG TERM GOAL #5   Title  Patient negotiates ramps, curbs & stairs with LRAD  & prosthesis modified independent for community access.     Time  10    Period  Weeks    Status  New    Target Date  02/18/18      Additional Long Term Goals   Additional Long Term Goals  Yes      PT LONG TERM GOAL #6   Title  Patient ambulates 200' with cane or less carrying 10# with prosthesis modified independent.    Time  10    Period  Weeks    Status  New    Target Date  02/18/18      PT LONG TERM GOAL #7   Title  Patient verbalizes recommendations for mounting/dismounting her horses to achieve her goal.     Time  10    Period  Weeks    Status  New    Target Date  02/18/18            Plan - 12/29/17 1248    Clinical Impression Statement  Pt performed all gait and  balance training with R crutch at supervision level to min guard.  Pt reported minimal soreness in L knee but reported no pain in anterior tibia since pads were placed this morning.    Rehab Potential  Good    Clinical Impairments Affecting Rehab Potential  high fall risk, gait dependency, unknowledgeable in safe prosthesis use/care    PT Frequency  2x / week    PT Duration  Other (comment)   10 weeks   PT Treatment/Interventions  ADLs/Self Care Home Management;Canalith Repostioning;Gait training;DME Instruction;Stair training;Functional mobility training;Therapeutic activities;Therapeutic exercise;Balance training;Neuromuscular re-education;Patient/family education;Orthotic Fit/Training;Prosthetic Training;Manual techniques;Vestibular    PT Next Visit Plan  continue to go over prosthetic care as new concepts arise, gait with single crutch, barriers with single crutch. Pt sees prosthetist next Wed (12/29/17) and hopes to get right foot/ankle brace reinforced as it stil allows ankle to roll, if she does, outside with single crutch.     PT Home Exercise Plan  Access Code: [email or web address]     Consulted and Agree with Plan of Care  Patient       Patient will benefit from skilled therapeutic intervention in order to  improve the following deficits and impairments:  Abnormal gait, Decreased activity tolerance, Decreased balance, Decreased endurance, Decreased mobility, Decreased strength, Dizziness, Impaired flexibility, Postural dysfunction, Prosthetic Dependency  Visit Diagnosis: Unsteadiness on feet  Other abnormalities of gait and mobility  Muscle weakness (generalized)     Problem List Patient Active Problem List   Diagnosis Date Noted  . Osteomyelitis, lower leg ([place]) 10/19/2017  . MSSA bacteremia 10/19/2017  . Left ankle pain 07/27/2017  . Osteoarthritis 07/27/2017  . Septic shock ([place]) 07/13/2017  . Shortness of breath 07/13/2017  . Bacteremia due to Klebsiella pneumoniae 07/13/2017  . Allergic reaction to drug 07/13/2017  . Uncontrolled type 2 diabetes mellitus with hyperglycemia ([place]) 07/12/2017  . Idiopathic peripheral neuropathy   . Osteomyelitis of right hand ([place])   . Osteomyelitis of finger of right hand ([place]) 06/08/2017  . MRSA infection   . 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 Controlled type 2 diabetes with neuropathy ([place]) 05/04/2016  . Open wound of plantar aspect of foot 01/13/2016  . Morbid (severe) obesity due to excess calories ([place]) 01/17/2015  . Deformity of ankle and foot, acquired 12/06/2014  . Charcot's joint of foot, left 08/03/2014  . Hypersomnia 12/20/2013  . Obstructive sleep apnea syndrome, severe 12/20/2013  . Other fatigue 12/20/2013  . Snoring 12/20/2013  . Prosthetic joint infection ([place]) 01/31/2013  . Postoperative anemia due to acute blood loss 06/25/2011  . Osteoarthritis of knee 06/23/2011  . Obesity, morbid (more than 100 lbs over ideal weight or BMI > 40) ([facility]) 06/23/2011    Bjorn Loser, PTA  12/29/17, 12:54 PM [facility] [address] [place], [place], [address] Phone: [phone]   Fax:   [phone]  Name: [name] MRN: [id number] Date of Birth: October 08, 1965

## 2017-12-31 ENCOUNTER — Encounter

## 2018-01-04 ENCOUNTER — Ambulatory Visit: Payer: BLUE CROSS/BLUE SHIELD | Admitting: Physical Therapy

## 2018-01-04 ENCOUNTER — Encounter: Payer: Self-pay | Admitting: Physical Therapy

## 2018-01-04 DIAGNOSIS — R2689 Other abnormalities of gait and mobility: Secondary | ICD-10-CM

## 2018-01-04 DIAGNOSIS — M6281 Muscle weakness (generalized): Secondary | ICD-10-CM

## 2018-01-04 DIAGNOSIS — R2681 Unsteadiness on feet: Secondary | ICD-10-CM

## 2018-01-04 NOTE — Therapy (Signed)
[facility] [address] [place], [place], [address] Phone: [phone]   Fax:  [phone]  Physical Therapy Treatment  Patient Details  Name: [name] MRN: [id number] Date of Birth: 12/11/1965 Referring Provider: Rolena Infante, MD   Encounter Date: 01/04/2018  PT End of Session - 01/04/18 1214    Visit Number  6    Number of Visits  20    Date for PT Re-Evaluation  02/18/18    Authorization Type  [employer] $700 deductible & $2500 oop max met; pt covered 100%, visit limit - PT, OT, chiropractore 30 combined; zero used    Authorization - Visit Number  5    Authorization - Number of Visits  30    PT Start Time  1155   pt late due to traffic   PT Stop Time  1230    PT Time Calculation (min)  35 min    Equipment Utilized During Treatment  --    Activity Tolerance  Patient tolerated treatment well;Patient limited by pain    Behavior During Therapy  [facility] for tasks assessed/performed       Past Medical History:  Diagnosis Date  . Arthritis   . Diabetes mellitus without complication ([place])   . DOE (dyspnea on exertion) 07/12/2017  . FUO (fever of unknown origin) 07/12/2017  . Gastroenteritis 04/2016  . Headache(784.0)    hormonal headache  . Low back pain 07/12/2017  . Neuropathy    bilateral feet  . Obese   . Pleuritic chest pain 07/12/2017  . Pneumonia    hx 20+ years ago  . Sleep apnea    bipap 1 yr  . Ulcer of foot ([place])    toe ulcer greater than a year ago    Past Surgical History:  Procedure Laterality Date  . AMPUTATION Right 06/11/2017   Procedure: RIGHT LONG FINGER AMPUTATION DIGIT;  Surgeon: Milly Jakob, MD;  Location: [facility];  Service: Orthopedics;  Laterality: Right;  . ANKLE FUSION Left 08/03/2014   Procedure: Left Tibiocalcaneal Fusion;  Surgeon: Newt Minion, MD;  Location: [facility];  Service: Orthopedics;  Laterality: Left;  . APPLICATION OF WOUND VAC Left 08/03/2014   Procedure: APPLICATION OF  WOUND VAC;  Surgeon: Newt Minion, MD;  Location: [facility];  Service: Orthopedics;  Laterality: Left;  . I&D KNEE WITH POLY EXCHANGE Right 01/31/2013   Procedure: IRRIGATION AND DEBRIDEMENT KNEE WITH POLY EXCHANGE possible Antibiotic Spacer;  Surgeon: Garald Balding, MD;  Location: [facility];  Service: Orthopedics;  Laterality: Right;  . INCISION AND DRAINAGE Right 01/31/2013   ANTIBIOTIC SPACER  RIGHT KNEE  DR Durward Fortes   . KNEE ARTHROSCOPY     left knee  . ORIF ANKLE FRACTURE Left 03/16/2014   Procedure: Foot Excision Charcot Collapse,  Internal Fixation;  Surgeon: Newt Minion, MD;  Location: [facility];  Service: Orthopedics;  Laterality: Left;  . REPLACEMENT TOTAL KNEE     left  . TOTAL KNEE ARTHROPLASTY  06/23/2011   Procedure: TOTAL KNEE ARTHROPLASTY;  Surgeon: Garald Balding, MD;  Location: [facility];  Service: Orthopedics;  Laterality: Right;  RIGHT TOTAL KNEE REPLACEMENT     There were no vitals filed for this visit.  Subjective Assessment - 01/04/18 1157    Subjective  Having issues with donning socket now since having pads added. Also having increased soreness in limb from knee down, unsure if from tweaking it last week in PT or from a very active  weekend- [employer], riding, lessons, on sunday taking neice out with the horses.     Pertinent History  L TTA, L TKA 2007, R TKA 2012, DM2, neuropathy, gout, OA, morbid obesity,     Limitations  Lifting;Standing;Walking;House hold activities    Patient Stated Goals  to use prosthesis walk outdoors, hiking, ride horse,     Currently in Pain?  Yes    Pain Score  3    4/10 at knee at times   Pain Location  Leg    Pain Orientation  Left    Pain Descriptors / Indicators  Aching;Sore    Pain Type  Acute pain    Pain Onset  In the past 7 days    Pain Frequency  Constant    Aggravating Factors   increased activity/prosthesis wear    Pain Relieving Factors  removing prosthesis           [facility] Adult PT Treatment/Exercise - 01/04/18  1214      Ambulation/Gait   Ambulation/Gait  Yes    Ambulation/Gait Assistance  5: Supervision    Ambulation/Gait Assistance Details  antalgic gait pattern into gym with single forearm crutch. improved weight shifitng/mechanics with remainder of gait after ice massage and repositioning of liner on limb.     Ambulation Distance (Feet)  100 Feet   x2, 120 x1   Assistive device  R Forearm Crutch;Prosthesis    Gait Pattern  Step-through pattern;Decreased stride length;Decreased stance time - left;Decreased weight shift to left    Ambulation Surface  Level;Indoor      Self-Care   Self-Care  Other Self-Care Comments      Prosthetics   Prosthetic Care Comments   educated on ice massage to residual limb for decreased pain/swelling with increased activity pt has been doing. Pt able to verbalize how to perform this at home as needed for pain relief. Also edcuated pt on placing center pin a little further back on her limb to allow for easier time engaging socket and to also pull skin foward from the back to allow for additional padding inside socket with wear. Pt was able to demonstrate this after ice massage.     Current prosthetic wear tolerance (days/week)   daily     Current prosthetic wear tolerance (#hours/day)   4-5 hours 2x day    Residual limb condition   intact with no issues    Education Provided  Residual limb care;Proper Donning   ice massage for pain/edema management    Person(s) Educated  Patient    Education Method  Explanation;Demonstration;Verbal cues    Education Method  Verbalized understanding;Returned demonstration;Verbal cues required    Donning Prosthesis  Supervision    Doffing Prosthesis  Supervision        PT Short Term Goals - 12/16/17 2149      PT SHORT TERM GOAL #1   Title  patient demonstrates proper donning & verbalizes proper cleaning.  (All STGs Target Date: 01/14/2018)    Time  1    Period  Months    Status  New    Target Date  01/14/18      PT SHORT  TERM GOAL #2   Title  Patient tolerates daily prosthesis wear >10 hrs total per day without skin or limb pain issues.     Time  1    Period  Months    Status  New    Target Date  01/14/18      PT SHORT  TERM GOAL #3   Title  Patient ambulates 500' including grass with cane & prosthesis with supervision.     Time  1    Period  Months    Status  New    Target Date  01/14/18      PT SHORT TERM GOAL #4   Title  Patient negotiates ramps, curbs & stairs 1 rail with cane & prosthesis with supervision.     Time  1    Period  Months    Status  New    Target Date  01/14/18      PT SHORT TERM GOAL #5   Title  Patient reaches 10" and picks up objects from floor without loss of balance with supervision.     Time  1    Period  Months    Status  New    Target Date  01/14/18        PT Long Term Goals - 12/16/17 2141      PT LONG TERM GOAL #1   Title  Patient demonstrates & verbalizes proper prosthetic care to enable safe prosthesis use. (All LTGs Target Date: 02/18/2018)    Time  10    Period  Weeks    Status  New    Target Date  02/18/18      PT LONG TERM GOAL #2   Title  Patient tolerates daily prosthesis wear >90% of awake hours without skin issues or limb pain to enable function throughout her day.     Time  10    Period  Weeks    Status  New    Target Date  02/18/18      PT LONG TERM GOAL #3   Title  Berg Balance >/= 45 /56  to indicate lower fall risk    Time  10    Period  Weeks    Status  New    Target Date  02/18/18      PT LONG TERM GOAL #4   Title  Patient ambulates 1000' including grass with LRAD & prosthesis modified independent to enable mobility on farm.     Time  10    Period  Weeks    Status  New    Target Date  02/18/18      PT LONG TERM GOAL #5   Title  Patient negotiates ramps, curbs & stairs with LRAD & prosthesis modified independent for community access.     Time  10    Period  Weeks    Status  New    Target Date  02/18/18      Additional Long  Term Goals   Additional Long Term Goals  Yes      PT LONG TERM GOAL #6   Title  Patient ambulates 200' with cane or less carrying 10# with prosthesis modified independent.    Time  10    Period  Weeks    Status  New    Target Date  02/18/18      PT LONG TERM GOAL #7   Title  Patient verbalizes recommendations for mounting/dismounting her horses to achieve her goal.     Time  10    Period  Weeks    Status  New    Target Date  02/18/18            Plan - 01/04/18 1214    Clinical Impression Statement  Today's skilled session focused on edcucation for pain management/swelling of limb, activity  modification and prosthetic management. Pt reported no pain at the end of the session after ice massage and repositioning of liner. The pt is progressing toward goals and should benefit from continued PT to progress toward unmet goals.     Rehab Potential  Good    Clinical Impairments Affecting Rehab Potential  high fall risk, gait dependency, unknowledgeable in safe prosthesis use/care    PT Frequency  2x / week    PT Duration  Other (comment)   10 weeks   PT Treatment/Interventions  ADLs/Self Care Home Management;Canalith Repostioning;Gait training;DME Instruction;Stair training;Functional mobility training;Therapeutic activities;Therapeutic exercise;Balance training;Neuromuscular re-education;Patient/family education;Orthotic Fit/Training;Prosthetic Training;Manual techniques;Vestibular    PT Next Visit Plan  check how pain is and if ice massage at home is helping; did she get her ankle brace reinforced on right side? balance training being midful of right ankle, barriers with single forearm crutch     PT Home Exercise Plan  Access Code: [email or web address]     Consulted and Agree with Plan of Care  Patient       Patient will benefit from skilled therapeutic intervention in order to improve the following deficits and impairments:  Abnormal gait, Decreased activity tolerance, Decreased balance,  Decreased endurance, Decreased mobility, Decreased strength, Dizziness, Impaired flexibility, Postural dysfunction, Prosthetic Dependency  Visit Diagnosis: Unsteadiness on feet  Other abnormalities of gait and mobility  Muscle weakness (generalized)     Problem List Patient Active Problem List   Diagnosis Date Noted  . Osteomyelitis, lower leg ([place]) 10/19/2017  . MSSA bacteremia 10/19/2017  . Left ankle pain 07/27/2017  . Osteoarthritis 07/27/2017  . Septic shock ([place]) 07/13/2017  . Shortness of breath 07/13/2017  . Bacteremia due to Klebsiella pneumoniae 07/13/2017  . Allergic reaction to drug 07/13/2017  . Uncontrolled type 2 diabetes mellitus with hyperglycemia ([place]) 07/12/2017  . Idiopathic peripheral neuropathy   . Osteomyelitis of right hand ([place])   . Osteomyelitis of finger of right hand ([place]) 06/08/2017  . MRSA infection   . 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 Controlled type 2 diabetes with neuropathy ([place]) 05/04/2016  . Open wound of plantar aspect of foot 01/13/2016  . Morbid (severe) obesity due to excess calories ([place]) 01/17/2015  . Deformity of ankle and foot, acquired 12/06/2014  . Charcot's joint of foot, left 08/03/2014  . Hypersomnia 12/20/2013  . Obstructive sleep apnea syndrome, severe 12/20/2013  . Other fatigue 12/20/2013  . Snoring 12/20/2013  . Prosthetic joint infection ([place]) 01/31/2013  . Postoperative anemia due to acute blood loss 06/25/2011  . Osteoarthritis of knee 06/23/2011  . Obesity, morbid (more than 100 lbs over ideal weight or BMI > 40) ([facility]) 06/23/2011    Willow Ora, PTA, [facility] [address], [place], [place] [address] [phone] 01/04/18, 1:09 PM   Name: HILJA KINTZEL MRN: [id number] Date of Birth: 1966-01-14

## 2018-01-07 ENCOUNTER — Ambulatory Visit: Payer: BLUE CROSS/BLUE SHIELD | Admitting: Physical Therapy

## 2018-01-07 ENCOUNTER — Encounter: Payer: Self-pay | Admitting: Physical Therapy

## 2018-01-07 DIAGNOSIS — R2681 Unsteadiness on feet: Secondary | ICD-10-CM

## 2018-01-07 DIAGNOSIS — M6281 Muscle weakness (generalized): Secondary | ICD-10-CM

## 2018-01-07 DIAGNOSIS — R293 Abnormal posture: Secondary | ICD-10-CM

## 2018-01-07 DIAGNOSIS — R2689 Other abnormalities of gait and mobility: Secondary | ICD-10-CM

## 2018-01-07 NOTE — Therapy (Signed)
[facility] [address] [place], [place], [address] Phone: [phone]   Fax:  [phone]  Physical Therapy Treatment  Patient Details  Name: [name] MRN: [id number] Date of Birth: December 06, 1965 Referring Provider: Rolena Infante, MD   Encounter Date: 01/07/2018  PT End of Session - 01/07/18 1106    Visit Number  7    Number of Visits  20    Date for PT Re-Evaluation  02/18/18    Authorization Type  [employer] $700 deductible & $2500 oop max met; pt covered 100%, visit limit - PT, OT, chiropractore 30 combined; zero used    Authorization - Visit Number  7    Authorization - Number of Visits  30    PT Start Time  1104    PT Stop Time  1145    PT Time Calculation (min)  41 min    Equipment Utilized During Treatment  Gait belt    Activity Tolerance  Patient tolerated treatment well;No increased pain    Behavior During Therapy  WFL for tasks assessed/performed       Past Medical History:  Diagnosis Date  . Arthritis   . Diabetes mellitus without complication ([place])   . DOE (dyspnea on exertion) 07/12/2017  . FUO (fever of unknown origin) 07/12/2017  . Gastroenteritis 04/2016  . Headache(784.0)    hormonal headache  . Low back pain 07/12/2017  . Neuropathy    bilateral feet  . Obese   . Pleuritic chest pain 07/12/2017  . Pneumonia    hx 20+ years ago  . Sleep apnea    bipap 1 yr  . Ulcer of foot ([place])    toe ulcer greater than a year ago    Past Surgical History:  Procedure Laterality Date  . AMPUTATION Right 06/11/2017   Procedure: RIGHT LONG FINGER AMPUTATION DIGIT;  Surgeon: Milly Jakob, MD;  Location: [facility];  Service: Orthopedics;  Laterality: Right;  . ANKLE FUSION Left 08/03/2014   Procedure: Left Tibiocalcaneal Fusion;  Surgeon: Newt Minion, MD;  Location: [facility];  Service: Orthopedics;  Laterality: Left;  . APPLICATION OF WOUND VAC Left 08/03/2014   Procedure: APPLICATION OF WOUND VAC;  Surgeon:  Newt Minion, MD;  Location: [facility];  Service: Orthopedics;  Laterality: Left;  . I&D KNEE WITH POLY EXCHANGE Right 01/31/2013   Procedure: IRRIGATION AND DEBRIDEMENT KNEE WITH POLY EXCHANGE possible Antibiotic Spacer;  Surgeon: Garald Balding, MD;  Location: [facility];  Service: Orthopedics;  Laterality: Right;  . INCISION AND DRAINAGE Right 01/31/2013   ANTIBIOTIC SPACER  RIGHT KNEE  DR Durward Fortes   . KNEE ARTHROSCOPY     left knee  . ORIF ANKLE FRACTURE Left 03/16/2014   Procedure: Foot Excision Charcot Collapse,  Internal Fixation;  Surgeon: Newt Minion, MD;  Location: [facility];  Service: Orthopedics;  Laterality: Left;  . REPLACEMENT TOTAL KNEE     left  . TOTAL KNEE ARTHROPLASTY  06/23/2011   Procedure: TOTAL KNEE ARTHROPLASTY;  Surgeon: Garald Balding, MD;  Location: [facility];  Service: Orthopedics;  Laterality: Right;  RIGHT TOTAL KNEE REPLACEMENT     There were no vitals filed for this visit.  Subjective Assessment - 01/07/18 1104    Subjective  Reports feeling much better today. The ice massage and readjustment of if liner position have helped "tremendously". No falls. Sees Ronalee Belts again at the end of the month for any additional adjustments needed.     Pertinent History  L TTA, L TKA 2007, R TKA 2012, DM2, neuropathy, gout, OA, morbid obesity,     Limitations  Lifting;Standing;Walking;House hold activities    Patient Stated Goals  to use prosthesis walk outdoors, hiking, ride horse,     Currently in Pain?  Yes    Pain Score  2     Pain Location  Generalized   "all over" arthritic pain   Pain Descriptors / Indicators  Aching    Pain Type  Chronic pain    Pain Onset  More than a month ago    Pain Frequency  Constant   varies in intensity   Aggravating Factors   weather, increased activity    Pain Relieving Factors  nothing really            [facility] Adult PT Treatment/Exercise - 01/07/18 1113      Transfers   Transfers  Sit to Stand;Stand to Sit    Sit to Stand  5:  Supervision;Without upper extremity assist;From chair/3-in-1    Stand to Sit  5: Supervision;Without upper extremity assist;To chair/3-in-1      Ambulation/Gait   Ambulation/Gait  Yes    Ambulation/Gait Assistance  5: Supervision;4: Min guard    Ambulation/Gait Assistance Details  no balance issues noted. min guard on grass/gravel due to continued right ankle instability.         Ambulation Distance (Feet)  500 Feet    Assistive device  R Forearm Crutch    Gait Pattern  Step-through pattern;Decreased stride length;Decreased stance time - left;Decreased weight shift to left    Ambulation Surface  Level;Indoor    Stairs  Yes    Stairs Assistance  5: Supervision    Stairs Assistance Details (indicate cue type and reason)  reminder cues for prosthetic foot placement with descending stairs reciprocally    Stair Management Technique  One rail Right;Alternating pattern;Forwards;With crutches    Number of Stairs  4    Height of Stairs  6    Ramp  5: Supervision    Ramp Details (indicate cue type and reason)  prosthesis/right crutch x 2 reps with no cues or assistance needed.       High Level Balance   High Level Balance Activities  Negotitating around obstacles;Figure 8 turns;Side stepping;Backward walking;Marching forwards;Tandem walking    High Level Balance Comments  with single crutch/prosthesis: demo'd technique with figure 8's to maintain correct pelvic positioning and weight shifting prior to pt performing task. then had pt perform 3 laps of figure 8's around hoola hoops on floor with min guard assist/reminder cues provided. fwd stepping over 4 bolsters of vaired heights- PTA demo'd correct technique prior to pt performance. min guard assist for 4 laps with reminder cues on correct sequencing provided.  In parallel bars for safety: side stepping x 3 laps each way with intermittent touch to bars for balance assistance, fwd marching/bwd walking x 3 laps each way progressing from bil light UE  support to intermittent touch to bars for balance. cues needed on posture, weight shifting and correct ex form/technique.       Prosthetics   Current prosthetic wear tolerance (days/week)   daily     Current prosthetic wear tolerance (#hours/day)   4-5 hours 2x day    Residual limb condition   intact with no issues    Education Provided  Residual limb care    Person(s) Educated  Patient    Education Method  Explanation;Demonstration;Verbal cues    Education Method  Verbalized understanding;Verbal  cues required;Needs further instruction    Donning Prosthesis  Supervision    Doffing Prosthesis  Supervision           PT Short Term Goals - 12/16/17 2149      PT SHORT TERM GOAL #1   Title  patient demonstrates proper donning & verbalizes proper cleaning.  (All STGs Target Date: 01/14/2018)    Time  1    Period  Months    Status  New    Target Date  01/14/18      PT SHORT TERM GOAL #2   Title  Patient tolerates daily prosthesis wear >10 hrs total per day without skin or limb pain issues.     Time  1    Period  Months    Status  New    Target Date  01/14/18      PT SHORT TERM GOAL #3   Title  Patient ambulates 500' including grass with cane & prosthesis with supervision.     Time  1    Period  Months    Status  New    Target Date  01/14/18      PT SHORT TERM GOAL #4   Title  Patient negotiates ramps, curbs & stairs 1 rail with cane & prosthesis with supervision.     Time  1    Period  Months    Status  New    Target Date  01/14/18      PT SHORT TERM GOAL #5   Title  Patient reaches 10" and picks up objects from floor without loss of balance with supervision.     Time  1    Period  Months    Status  New    Target Date  01/14/18        PT Long Term Goals - 12/16/17 2141      PT LONG TERM GOAL #1   Title  Patient demonstrates & verbalizes proper prosthetic care to enable safe prosthesis use. (All LTGs Target Date: 02/18/2018)    Time  10    Period  Weeks     Status  New    Target Date  02/18/18      PT LONG TERM GOAL #2   Title  Patient tolerates daily prosthesis wear >90% of awake hours without skin issues or limb pain to enable function throughout her day.     Time  10    Period  Weeks    Status  New    Target Date  02/18/18      PT LONG TERM GOAL #3   Title  Berg Balance >/= 45 /56  to indicate lower fall risk    Time  10    Period  Weeks    Status  New    Target Date  02/18/18      PT LONG TERM GOAL #4   Title  Patient ambulates 1000' including grass with LRAD & prosthesis modified independent to enable mobility on farm.     Time  10    Period  Weeks    Status  New    Target Date  02/18/18      PT LONG TERM GOAL #5   Title  Patient negotiates ramps, curbs & stairs with LRAD & prosthesis modified independent for community access.     Time  10    Period  Weeks    Status  New    Target Date  02/18/18  Additional Long Term Goals   Additional Long Term Goals  Yes      PT LONG TERM GOAL #6   Title  Patient ambulates 200' with cane or less carrying 10# with prosthesis modified independent.    Time  10    Period  Weeks    Status  New    Target Date  02/18/18      PT LONG TERM GOAL #7   Title  Patient verbalizes recommendations for mounting/dismounting her horses to achieve her goal.     Time  10    Period  Weeks    Status  New    Target Date  02/18/18            Plan - 01/07/18 1106    Clinical Impression Statement  Today's skilled session continued to focus on mobility with prosthesis/single crutch and high level balance activities with no issues reported. The pt was able to tolerate increased activity today without any pain. She is making steady progress toward goals and should benefit from continued PT to progress toward unmet goals.                              Rehab Potential  Good    Clinical Impairments Affecting Rehab Potential  high fall risk, gait dependency, unknowledgeable in safe prosthesis use/care     PT Frequency  2x / week    PT Duration  Other (comment)   10 weeks   PT Treatment/Interventions  ADLs/Self Care Home Management;Canalith Repostioning;Gait training;DME Instruction;Stair training;Functional mobility training;Therapeutic activities;Therapeutic exercise;Balance training;Neuromuscular re-education;Patient/family education;Orthotic Fit/Training;Prosthetic Training;Manual techniques;Vestibular    PT Next Visit Plan  balance training being midful of right ankle on complaint surfaces, practice stepping up onto/off of step 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    PT Home Exercise Plan  Access Code: [email or web address]     Consulted and Agree with Plan of Care  Patient       Patient will benefit from skilled therapeutic intervention in order to improve the following deficits and impairments:  Abnormal gait, Decreased activity tolerance, Decreased balance, Decreased endurance, Decreased mobility, Decreased strength, Dizziness, Impaired flexibility, Postural dysfunction, Prosthetic Dependency  Visit Diagnosis: Unsteadiness on feet  Other abnormalities of gait and mobility  Muscle weakness (generalized)  Abnormal posture     Problem List Patient Active Problem List   Diagnosis Date Noted  . Osteomyelitis, lower leg ([place]) 10/19/2017  . MSSA bacteremia 10/19/2017  . Left ankle pain 07/27/2017  . Osteoarthritis 07/27/2017  . Septic shock ([place]) 07/13/2017  . Shortness of breath 07/13/2017  . Bacteremia due to Klebsiella pneumoniae 07/13/2017  . Allergic reaction to drug 07/13/2017  . Uncontrolled type 2 diabetes mellitus with hyperglycemia ([place]) 07/12/2017  . Idiopathic peripheral neuropathy   . Osteomyelitis of right hand ([place])   . Osteomyelitis of finger of right hand ([place]) 06/08/2017  . MRSA infection   . 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 Controlled type 2 diabetes with  neuropathy ([place]) 05/04/2016  . Open wound of plantar aspect of foot 01/13/2016  . Morbid (severe) obesity due to excess calories ([place]) 01/17/2015  . Deformity of ankle and foot, acquired 12/06/2014  . Charcot's joint of foot, left 08/03/2014  . Hypersomnia 12/20/2013  . Obstructive sleep apnea syndrome, severe 12/20/2013  . Other fatigue 12/20/2013  . Snoring 12/20/2013  . Prosthetic  joint infection ([place]) 01/31/2013  . Postoperative anemia due to acute blood loss 06/25/2011  . Osteoarthritis of knee 06/23/2011  . Obesity, morbid (more than 100 lbs over ideal weight or BMI > 40) ([facility]) 06/23/2011    Willow Ora, PTA, [facility] [address], [place], [place] [address] [phone] 01/07/18, 2:28 PM   Name: JAENA BROCATO MRN: [id number] Date of Birth: 01-21-1966

## 2018-01-11 ENCOUNTER — Ambulatory Visit: Payer: BLUE CROSS/BLUE SHIELD | Admitting: Physical Therapy

## 2018-01-11 ENCOUNTER — Encounter: Payer: Self-pay | Admitting: Physical Therapy

## 2018-01-11 DIAGNOSIS — R2681 Unsteadiness on feet: Secondary | ICD-10-CM

## 2018-01-11 DIAGNOSIS — R2689 Other abnormalities of gait and mobility: Secondary | ICD-10-CM

## 2018-01-11 DIAGNOSIS — M6281 Muscle weakness (generalized): Secondary | ICD-10-CM

## 2018-01-11 DIAGNOSIS — R296 Repeated falls: Secondary | ICD-10-CM

## 2018-01-11 DIAGNOSIS — R293 Abnormal posture: Secondary | ICD-10-CM

## 2018-01-11 NOTE — Therapy (Signed)
[facility] [address] [place], [place], [address] Phone: [phone]   Fax:  [phone]  Physical Therapy Treatment  Patient Details  Name: Emiley Digiacomo Puls MRN: [id number] Date of Birth: June 24, 1965 Referring Provider: Rolena Infante, MD   Encounter Date: 01/11/2018  PT End of Session - 01/11/18 0950    Visit Number  8    Number of Visits  20    Date for PT Re-Evaluation  02/18/18    Authorization Type  [employer] $700 deductible & $2500 oop max met; pt covered 100%, visit limit - PT, OT, chiropractore 30 combined; zero used    Authorization - Visit Number  8    Authorization - Number of Visits  30    PT Start Time  0945   pt late for appt   PT Stop Time  1025    PT Time Calculation (min)  40 min    Equipment Utilized During Treatment  Gait belt    Activity Tolerance  Patient tolerated treatment well;No increased pain    Behavior During Therapy  WFL for tasks assessed/performed       Past Medical History:  Diagnosis Date  . Arthritis   . Diabetes mellitus without complication ([place])   . DOE (dyspnea on exertion) 07/12/2017  . FUO (fever of unknown origin) 07/12/2017  . Gastroenteritis 04/2016  . Headache(784.0)    hormonal headache  . Low back pain 07/12/2017  . Neuropathy    bilateral feet  . Obese   . Pleuritic chest pain 07/12/2017  . Pneumonia    hx 20+ years ago  . Sleep apnea    bipap 1 yr  . Ulcer of foot ([place])    toe ulcer greater than a year ago    Past Surgical History:  Procedure Laterality Date  . AMPUTATION Right 06/11/2017   Procedure: RIGHT LONG FINGER AMPUTATION DIGIT;  Surgeon: Milly Jakob, MD;  Location: [facility];  Service: Orthopedics;  Laterality: Right;  . ANKLE FUSION Left 08/03/2014   Procedure: Left Tibiocalcaneal Fusion;  Surgeon: Newt Minion, MD;  Location: [facility];  Service: Orthopedics;  Laterality: Left;  . APPLICATION OF WOUND VAC Left 08/03/2014   Procedure: APPLICATION OF WOUND  VAC;  Surgeon: Newt Minion, MD;  Location: [facility];  Service: Orthopedics;  Laterality: Left;  . I&D KNEE WITH POLY EXCHANGE Right 01/31/2013   Procedure: IRRIGATION AND DEBRIDEMENT KNEE WITH POLY EXCHANGE possible Antibiotic Spacer;  Surgeon: Garald Balding, MD;  Location: [facility];  Service: Orthopedics;  Laterality: Right;  . INCISION AND DRAINAGE Right 01/31/2013   ANTIBIOTIC SPACER  RIGHT KNEE  DR Durward Fortes   . KNEE ARTHROSCOPY     left knee  . ORIF ANKLE FRACTURE Left 03/16/2014   Procedure: Foot Excision Charcot Collapse,  Internal Fixation;  Surgeon: Newt Minion, MD;  Location: [facility];  Service: Orthopedics;  Laterality: Left;  . REPLACEMENT TOTAL KNEE     left  . TOTAL KNEE ARTHROPLASTY  06/23/2011   Procedure: TOTAL KNEE ARTHROPLASTY;  Surgeon: Garald Balding, MD;  Location: [facility];  Service: Orthopedics;  Laterality: Right;  RIGHT TOTAL KNEE REPLACEMENT     There were no vitals filed for this visit.  Subjective Assessment - 01/11/18 0947    Subjective  Had a fall yesterday after putting horse away, got her prosthesis wrapped up in halter leader as she was putting it up. [place] forward to the left, landing on her limb and hip. Sore  today. Got herself up.     Pertinent History  L TTA, L TKA 2007, R TKA 2012, DM2, neuropathy, gout, OA, morbid obesity,     Limitations  Lifting;Standing;Walking;House hold activities    Patient Stated Goals  to use prosthesis walk outdoors, hiking, ride horse,     Currently in Pain?  Yes    Pain Score  4     Pain Location  Leg   and hands   Pain Orientation  Left    Pain Descriptors / Indicators  Aching;Sore    Pain Type  Acute pain    Pain Onset  Yesterday    Pain Frequency  Constant    Aggravating Factors   recent fall    Pain Relieving Factors  rest, nothing otherwise         01/11/18 0950  Transfers  Transfers Sit to Stand;Stand to Sit  Sit to Stand 5: Supervision;Without upper extremity assist;From chair/3-in-1  Stand to Sit 5:  Supervision;Without upper extremity assist;To chair/3-in-1  Ambulation/Gait  Ambulation/Gait Yes  Ambulation/Gait Assistance 5: Supervision;4: Min guard  Ambulation/Gait Assistance Details min guard assist on grass with single crutch due to right ankle instability despite brace, otherwise pt was at supervision level.   Ambulation Distance (Feet) 500 Feet  Assistive device R Forearm Crutch;Prosthesis  Gait Pattern Step-through pattern;Decreased stride length;Decreased stance time - left;Decreased weight shift to left  Ambulation Surface Level;Indoor;Unlevel;Outdoor;Paved;Gravel;Grass  Stairs Yes  Stairs Assistance 5: Supervision  Stairs Assistance Details (indicate cue type and reason) no cues needed, pt demo'd correct and safe technique.   Stair Management Technique One rail Right;Alternating pattern;Forwards;With crutches  Number of Stairs 4  Height of Stairs 6  Ramp 5: Supervision  Ramp Details (indicate cue type and reason) prosthesis/single crutch only with no balance loss noted.  Curb 5: Supervision  Curb Details (indicate cue type and reason) with prosthesis/single crutch, no cues or assist needed  High Level Balance  High Level Balance Activities Side stepping;Backward walking;Marching forwards  High Level Balance Comments in parallel bars with no UE support/AD for 3 laps each. intermittent touch to bars with cues on posture, weight shifting and to slow down to improve balance. min guard to min assist for balance.   Therapeutic Activites   Therapeutic Activities Other Therapeutic Activities  Other Therapeutic Activities pt able to reach forward for 10 inches and retrieve item from floor with supervision only, no UE support.   Prosthetics  Prosthetic Care Comments  pt to increase wear time to all awake hours with drying every 2-3 hours as needed.   Current prosthetic wear tolerance (days/week)  daily   Residual limb condition  small bruise on bottom of limb, otherwise intact without  issues.   Education Provided Residual limb care;Proper wear schedule/adjustment;Proper weight-bearing schedule/adjustment  Person(s) Educated Patient  Education Method Explanation;Demonstration;Verbal cues  Education Method Verbalized understanding;Verbal cues required;Needs further instruction  Donning Prosthesis 5  Doffing Prosthesis 5         PT Short Term Goals - 01/11/18 1006      PT SHORT TERM GOAL #1   Title  patient demonstrates proper donning & verbalizes proper cleaning.  (All STGs Target Date: 01/14/2018)    Baseline  01/11/18: met today    Status  Achieved      PT SHORT TERM GOAL #2   Title  Patient tolerates daily prosthesis wear >10 hrs total per day without skin or limb pain issues.     Baseline  01/11/18:  met today  Time  --    Period  --    Status  Achieved      PT SHORT TERM GOAL #3   Title  Patient ambulates 500' including grass with cane & prosthesis with supervision.     Baseline  01/11/18: met today    Status  Partially Met      PT SHORT TERM GOAL #4   Title  Patient negotiates ramps, curbs & stairs 1 rail with cane & prosthesis with supervision.     Baseline  01/11/18:     Time  --    Period  --    Status  Achieved      PT SHORT TERM GOAL #5   Title  Patient reaches 10" and picks up objects from floor without loss of balance with supervision.     Baseline  01/11/18:     Time  --    Period  --    Status  Achieved        PT Long Term Goals - 01/11/18 1024      PT LONG TERM GOAL #1   Title  Patient demonstrates & verbalizes proper prosthetic care to enable safe prosthesis use. (All LTGs Target Date: 02/18/2018)    Time  10    Period  Weeks    Status  On-going      PT LONG TERM GOAL #2   Title  Patient tolerates daily prosthesis wear >90% of awake hours without skin issues or limb pain to enable function throughout her day.     Time  10    Period  Weeks    Status  On-going      PT LONG TERM GOAL #3   Title  Berg Balance >/= 45 /56  to  indicate lower fall risk    Time  10    Period  Weeks    Status  On-going      PT LONG TERM GOAL #4   Title  Patient ambulates 1000' including grass with LRAD & prosthesis modified independent to enable mobility on farm.     Time  10    Period  Weeks    Status  On-going      PT LONG TERM GOAL #5   Title  Patient negotiates ramps, curbs & stairs with LRAD & prosthesis modified independent for community access.     Time  10    Period  Weeks    Status  On-going      PT LONG TERM GOAL #6   Title  Patient ambulates 200' with cane or less carrying 10# with prosthesis modified independent.    Time  10    Period  Weeks    Status  On-going      PT LONG TERM GOAL #7   Title  Patient verbalizes recommendations for mounting/dismounting her horses to achieve her goal. 01/11/18: Pt is independent with riding horses at this time.     Time  --    Period  --    Status  Achieved          01/11/18 0950  Plan  Clinical Impression Statement Primary PT made aware of fall and assessed bruise on limb. Felt pt was okay to continue with PT and prosthesis wear. On further discussion it was brought up that the bruise could also be due to the fact the pt has progressed her horse rideing to a trot. The constance bouncing pressure in the stirrup could be contributing to this  as well. The pt is to keep an eye on the limb before and after riding to monitor it. Remainder of session addressed progress toward STGs with goals either partially met or fully met.  No issues reported. The pt is progressing well toward goals and should benefit from continued PT to progress toward unmet goals.                       Pt will benefit from skilled therapeutic intervention in order to improve on the following deficits Abnormal gait;Decreased activity tolerance;Decreased balance;Decreased endurance;Decreased mobility;Decreased strength;Dizziness;Impaired flexibility;Postural dysfunction;Prosthetic Dependency  Rehab Potential Good   Clinical Impairments Affecting Rehab Potential high fall risk, gait dependency, unknowledgeable in safe prosthesis use/care  PT Frequency 2x / week  PT Duration Other (comment) (10 weeks)  PT Treatment/Interventions ADLs/Self Care Home Management;Canalith Repostioning;Gait training;DME Instruction;Stair training;Functional mobility training;Therapeutic activities;Therapeutic exercise;Balance training;Neuromuscular re-education;Patient/family education;Orthotic Fit/Training;Prosthetic Training;Manual techniques;Vestibular  PT Next Visit Plan balance training being midful of right ankle on complaint surfaces, practice stepping up onto/off of step 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  PT Home Exercise Plan Access Code: [email or web address]   Consulted and Agree with Plan of Care Patient         Patient will benefit from skilled therapeutic intervention in order to improve the following deficits and impairments:  Abnormal gait, Decreased activity tolerance, Decreased balance, Decreased endurance, Decreased mobility, Decreased strength, Dizziness, Impaired flexibility, Postural dysfunction, Prosthetic Dependency  Visit Diagnosis: Unsteadiness on feet  Other abnormalities of gait and mobility  Muscle weakness (generalized)  Abnormal posture  Repeated falls     Problem List Patient Active Problem List   Diagnosis Date Noted  . Osteomyelitis, lower leg ([place]) 10/19/2017  . MSSA bacteremia 10/19/2017  . Left ankle pain 07/27/2017  . Osteoarthritis 07/27/2017  . Septic shock ([place]) 07/13/2017  . Shortness of breath 07/13/2017  . Bacteremia due to Klebsiella pneumoniae 07/13/2017  . Allergic reaction to drug 07/13/2017  . Uncontrolled type 2 diabetes mellitus with hyperglycemia ([place]) 07/12/2017  . Idiopathic peripheral neuropathy   . Osteomyelitis of right hand ([place])   . Osteomyelitis of finger of right hand ([place]) 06/08/2017  . MRSA infection   . 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 Controlled type 2 diabetes with neuropathy ([place]) 05/04/2016  . Open wound of plantar aspect of foot 01/13/2016  . Morbid (severe) obesity due to excess calories ([place]) 01/17/2015  . Deformity of ankle and foot, acquired 12/06/2014  . Charcot's joint of foot, left 08/03/2014  . Hypersomnia 12/20/2013  . Obstructive sleep apnea syndrome, severe 12/20/2013  . Other fatigue 12/20/2013  . Snoring 12/20/2013  . Prosthetic joint infection ([place]) 01/31/2013  . Postoperative anemia due to acute blood loss 06/25/2011  . Osteoarthritis of knee 06/23/2011  . Obesity, morbid (more than 100 lbs over ideal weight or BMI > 40) ([facility]) 06/23/2011    Willow Ora, PTA, [facility] [address], [place], [place] [address] [phone] 01/11/18, 10:29 AM   Name: CLAUDENE GATLIFF MRN: [id number] Date of Birth: 27-Feb-1966

## 2018-01-14 ENCOUNTER — Ambulatory Visit: Payer: BLUE CROSS/BLUE SHIELD

## 2018-01-14 DIAGNOSIS — R2681 Unsteadiness on feet: Secondary | ICD-10-CM | POA: Diagnosis not present

## 2018-01-14 DIAGNOSIS — R2689 Other abnormalities of gait and mobility: Secondary | ICD-10-CM

## 2018-01-14 NOTE — Therapy (Signed)
[facility] [address] [place], [place], [address] Phone: [phone]   Fax:  [phone]  Physical Therapy Treatment  Patient Details  Name: [name] MRN: [id number] Date of Birth: 10/16/1965 Referring Provider: Rolena Infante, MD   Encounter Date: 01/14/2018  PT End of Session - 01/14/18 1104    Visit Number  9    Number of Visits  20    Date for PT Re-Evaluation  02/18/18    Authorization Type  [employer] $700 deductible & $2500 oop max met; pt covered 100%, visit limit - PT, OT, chiropractore 30 combined; zero used    Authorization - Visit Number  8    Authorization - Number of Visits  30    PT Start Time  1100    PT Stop Time  1143    PT Time Calculation (min)  43 min    Equipment Utilized During Treatment  Gait belt    Activity Tolerance  Patient tolerated treatment well;No increased pain    Behavior During Therapy  WFL for tasks assessed/performed       Past Medical History:  Diagnosis Date  . Arthritis   . Diabetes mellitus without complication ([place])   . DOE (dyspnea on exertion) 07/12/2017  . FUO (fever of unknown origin) 07/12/2017  . Gastroenteritis 04/2016  . Headache(784.0)    hormonal headache  . Low back pain 07/12/2017  . Neuropathy    bilateral feet  . Obese   . Pleuritic chest pain 07/12/2017  . Pneumonia    hx 20+ years ago  . Sleep apnea    bipap 1 yr  . Ulcer of foot ([place])    toe ulcer greater than a year ago    Past Surgical History:  Procedure Laterality Date  . AMPUTATION Right 06/11/2017   Procedure: RIGHT LONG FINGER AMPUTATION DIGIT;  Surgeon: Milly Jakob, MD;  Location: [facility];  Service: Orthopedics;  Laterality: Right;  . ANKLE FUSION Left 08/03/2014   Procedure: Left Tibiocalcaneal Fusion;  Surgeon: Newt Minion, MD;  Location: [facility];  Service: Orthopedics;  Laterality: Left;  . APPLICATION OF WOUND VAC Left 08/03/2014   Procedure: APPLICATION OF WOUND VAC;  Surgeon:  Newt Minion, MD;  Location: [facility];  Service: Orthopedics;  Laterality: Left;  . I&D KNEE WITH POLY EXCHANGE Right 01/31/2013   Procedure: IRRIGATION AND DEBRIDEMENT KNEE WITH POLY EXCHANGE possible Antibiotic Spacer;  Surgeon: Garald Balding, MD;  Location: [facility];  Service: Orthopedics;  Laterality: Right;  . INCISION AND DRAINAGE Right 01/31/2013   ANTIBIOTIC SPACER  RIGHT KNEE  DR Durward Fortes   . KNEE ARTHROSCOPY     left knee  . ORIF ANKLE FRACTURE Left 03/16/2014   Procedure: Foot Excision Charcot Collapse,  Internal Fixation;  Surgeon: Newt Minion, MD;  Location: [facility];  Service: Orthopedics;  Laterality: Left;  . REPLACEMENT TOTAL KNEE     left  . TOTAL KNEE ARTHROPLASTY  06/23/2011   Procedure: TOTAL KNEE ARTHROPLASTY;  Surgeon: Garald Balding, MD;  Location: [facility];  Service: Orthopedics;  Laterality: Right;  RIGHT TOTAL KNEE REPLACEMENT     There were no vitals filed for this visit.  Subjective Assessment - 01/14/18 1102    Subjective  No new complaints, no falls to report.     Pertinent History  L TTA, L TKA 2007, R TKA 2012, DM2, neuropathy, gout, OA, morbid obesity,     Limitations  Lifting;Standing;Walking;House hold activities  Patient Stated Goals  to use prosthesis walk outdoors, hiking, ride horse,     Currently in Pain?  No/denies    Pain Onset  --        [facility] Adult PT Treatment/Exercise - 01/14/18 1150      Ambulation/Gait   Ambulation/Gait  Yes    Ambulation/Gait Assistance  5: Supervision    Ambulation/Gait Assistance Details  No AD required during ambulation, negotiating turns and obstacles on indoor level surfaces, VC's for step length and weightshift.     Ambulation Distance (Feet)  450 Feet    Assistive device  Prosthesis    Gait Pattern  Step-through pattern;Decreased stride length;Decreased stance time - left;Decreased weight shift to left    Ambulation Surface  Level;Indoor    Gait Comments  Pt ambulating 191f holding cup of water while  negotiating turns with no AD required, VC's for pacing and step length. Supervision for safety.       High Level Balance   High Level Balance Activities  Side stepping;Backward walking;Negotiating over obstacles    High Level Balance Comments  in parallel bars with SUE support, 3 laps each. Pt stepping fwds/bkwds over short orange hurdles, side stepping over cones, cues on posture, weight shifting and pacing. min guard to min assist for balance.       Neuro Re-ed    Neuro Re-ed Details   Pt in parallel bars with BUE progressing to SUE required while stepping on/off blue foam beam fwds/bkwds, standing on airex performing toe taps to colored circles with VC's for proper technique and pacing.            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          PT Short Term Goals - 01/14/18 1220      PT SHORT TERM GOAL #1   Title  patient demonstrates proper donning & verbalizes proper cleaning.  (All STGs Target Date: 01/14/2018)    Baseline  01/11/18: met today    Status  Achieved      PT SHORT TERM GOAL #2   Title  Patient tolerates daily prosthesis wear >10 hrs total per day without skin or limb pain issues.     Baseline  01/11/18:  met today except for recent bruise on distal end of limb either from the fall or advanced riding of horse. The pt is to continue to monitor it.     Status  Achieved      PT SHORT TERM GOAL #3   Title  Patient ambulates 500' including grass with cane & prosthesis with supervision.     Baseline  01/11/18: met the distance today, min guard assist on compliant surfaces with single crutch support.     Status  Partially Met      PT SHORT TERM GOAL #4   Title  Patient negotiates ramps, curbs & stairs 1 rail with cane & prosthesis with supervision.     Baseline  01/11/18: met today    Status  Achieved      PT SHORT TERM GOAL #5   Title   Patient reaches 10" and picks up objects from floor without loss of balance with supervision.     Baseline  01/11/18: met today     Status  Achieved        PT Long Term Goals - 01/14/18 1220  PT LONG TERM GOAL #1   Title  Patient demonstrates & verbalizes proper prosthetic care to enable safe prosthesis use. (All LTGs Target Date: 02/18/2018)    Time  10    Period  Weeks    Status  On-going      PT LONG TERM GOAL #2   Title  Patient tolerates daily prosthesis wear >90% of awake hours without skin issues or limb pain to enable function throughout her day.     Time  10    Period  Weeks    Status  On-going      PT LONG TERM GOAL #3   Title  Berg Balance >/= 45 /56  to indicate lower fall risk    Time  10    Period  Weeks    Status  On-going      PT LONG TERM GOAL #4   Title  Patient ambulates 1000' including grass with LRAD & prosthesis modified independent to enable mobility on farm.     Time  10    Period  Weeks    Status  On-going      PT LONG TERM GOAL #5   Title  Patient negotiates ramps, curbs & stairs with LRAD & prosthesis modified independent for community access.     Time  10    Period  Weeks    Status  On-going      PT LONG TERM GOAL #6   Title  Patient ambulates 200' with cane or less carrying 10# with prosthesis modified independent.    Time  10    Period  Weeks    Status  On-going      PT LONG TERM GOAL #7   Title  Patient verbalizes recommendations for mounting/dismounting her horses to achieve her goal. 01/11/18: Pt is independent with riding horses at this time.     Status  Achieved        Plan - 01/14/18 1214    Clinical Impression Statement  Todays skilled session focused on gait training with no AD while negotiating turns/ostacles and high level balance activities to improve stability and simulate horse grooming on stool. Pt should benefit from continued PT to progress towards goals.     Rehab Potential  Good    Clinical Impairments Affecting  Rehab Potential  high fall risk, gait dependency, unknowledgeable in safe prosthesis use/care    PT Frequency  2x / week    PT Duration  Other (comment)   10 weeks   PT Treatment/Interventions  ADLs/Self Care Home Management;Canalith Repostioning;Gait training;DME Instruction;Stair training;Functional mobility training;Therapeutic activities;Therapeutic exercise;Balance training;Neuromuscular re-education;Patient/family education;Orthotic Fit/Training;Prosthetic Training;Manual techniques;Vestibular    PT Next Visit Plan  continue high level balance training being midful of right ankle on complaint surfaces, gait training with hand held/cognitive distraction.    PT Home Exercise Plan  Access Code: [email or web address]     Consulted and Agree with Plan of Care  Patient       Patient will benefit from skilled therapeutic intervention in order to improve the following deficits and impairments:  Abnormal gait, Decreased activity tolerance, Decreased balance, Decreased endurance, Decreased mobility, Decreased strength, Dizziness, Impaired flexibility, Postural dysfunction, Prosthetic Dependency  Visit Diagnosis: Unsteadiness on feet  Other abnormalities of gait and mobility    Problem List Patient Active Problem List   Diagnosis Date Noted  . Osteomyelitis, lower leg ([place]) 10/19/2017  . MSSA bacteremia 10/19/2017  . Left ankle pain 07/27/2017  . Osteoarthritis 07/27/2017  . Septic shock ([place])  07/13/2017  . Shortness of breath 07/13/2017  . Bacteremia due to Klebsiella pneumoniae 07/13/2017  . Allergic reaction to drug 07/13/2017  . Uncontrolled type 2 diabetes mellitus with hyperglycemia ([place]) 07/12/2017  . Idiopathic peripheral neuropathy   . Osteomyelitis of right hand ([place])   . Osteomyelitis of finger of right hand ([place]) 06/08/2017  . MRSA infection   . 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, without long-term current use of insulin ([place]) 06/08/2016  . Controlled type  2 diabetes with neuropathy ([place]) 05/04/2016  . Open wound of plantar aspect of foot 01/13/2016  . Morbid (severe) obesity due to excess calories ([place]) 01/17/2015  . Deformity of ankle and foot, acquired 12/06/2014  . Charcot's joint of foot, left 08/03/2014  . Hypersomnia 12/20/2013  . Obstructive sleep apnea syndrome, severe 12/20/2013  . Other fatigue 12/20/2013  . Snoring 12/20/2013  . Prosthetic joint infection ([place]) 01/31/2013  . Postoperative anemia due to acute blood loss 06/25/2011  . Osteoarthritis of knee 06/23/2011  . Obesity, morbid (more than 100 lbs over ideal weight or BMI > 40) ([facility]) 06/23/2011   Shanda Cadotte, PTA  [facility] [place] 01/14/2018, 12:29 PM  [facility] [address] [place], [place], [address] Phone: [phone]   Fax:  [phone]  Name: [name] MRN: [id number] Date of Birth: [birth year]

## 2018-01-18 ENCOUNTER — Ambulatory Visit: Payer: BLUE CROSS/BLUE SHIELD

## 2018-01-18 DIAGNOSIS — R2689 Other abnormalities of gait and mobility: Secondary | ICD-10-CM

## 2018-01-18 DIAGNOSIS — R2681 Unsteadiness on feet: Secondary | ICD-10-CM | POA: Diagnosis not present

## 2018-01-18 NOTE — Therapy (Signed)
[facility] [address] [place], [place], [address] Phone: [phone]   Fax:  [phone]  Physical Therapy Treatment  Patient Details  Name: [name] MRN: [id number] Date of Birth: 07/21/1965 Referring Provider: Paul Tawney, MD   Encounter Date: 01/18/2018  PT End of Session - 01/18/18 1106    Visit Number  10    Number of Visits  20    Date for PT Re-Evaluation  02/18/18    Authorization Type  [employer] $700 deductible & $2500 oop max met; pt covered 100%, visit limit - PT, OT, chiropractore 30 combined; zero used    Authorization - Visit Number  8    Authorization - Number of Visits  30    PT Start Time  1102    PT Stop Time  1145    PT Time Calculation (min)  43 min    Equipment Utilized During Treatment  Gait belt    Activity Tolerance  Patient tolerated treatment well;No increased pain    Behavior During Therapy  WFL for tasks assessed/performed       Past Medical History:  Diagnosis Date  . Arthritis   . Diabetes mellitus without complication (HCC)   . DOE (dyspnea on exertion) 07/12/2017  . FUO (fever of unknown origin) 07/12/2017  . Gastroenteritis 04/2016  . Headache(784.0)    hormonal headache  . Low back pain 07/12/2017  . Neuropathy    bilateral feet  . Obese   . Pleuritic chest pain 07/12/2017  . Pneumonia    hx 20+ years ago  . Sleep apnea    bipap 1 yr  . Ulcer of foot (HCC)    toe ulcer greater than a year ago    Past Surgical History:  Procedure Laterality Date  . AMPUTATION Right 06/11/2017   Procedure: RIGHT LONG FINGER AMPUTATION DIGIT;  Surgeon: Thompson, David, MD;  Location: [facility];  Service: Orthopedics;  Laterality: Right;  . ANKLE FUSION Left 08/03/2014   Procedure: Left Tibiocalcaneal Fusion;  Surgeon: Marcus Duda V, MD;  [facility]: [facility];  Service: Orthopedics;  Laterality: Left;  . APPLICATION OF WOUND VAC Left 08/03/2014   Procedure: APPLICATION OF WOUND VAC;  Surgeon:  Marcus Duda V, MD;  [facility];  Service: Orthopedics;  Laterality: Left;  . I&D KNEE WITH POLY EXCHANGE Right 01/31/2013   Procedure: IRRIGATION AND DEBRIDEMENT KNEE WITH POLY EXCHANGE possible Antibiotic Spacer;  Surgeon: Peter W Whitfield, MD;  [facility];  Service: Orthopedics;  Laterality: Right;  . INCISION AND DRAINAGE Right 01/31/2013   ANTIBIOTIC SPACER  RIGHT KNEE  DR WHITFIELD   . KNEE ARTHROSCOPY     left knee  . ORIF ANKLE FRACTURE Left 03/16/2014   Procedure: Foot Excision Charcot Collapse,  Internal Fixation;  Surgeon: Marcus Duda V, MD;  [facility];  Service: Orthopedics;  Laterality: Left;  . REPLACEMENT TOTAL KNEE     left  . TOTAL KNEE ARTHROPLASTY  06/23/2011   Procedure: TOTAL KNEE ARTHROPLASTY;  Surgeon: Peter W Whitfield, MD;  [facility];  Service: Orthopedics;  Laterality: Right;  RIGHT TOTAL KNEE REPLACEMENT     There were no vitals filed for this visit.  Subjective Assessment - 01/18/18 1105    Subjective  No new complaints, no falls to report.  Pt states that she has an appointment tomorrow 9/25 at [facility] for prosthesis check/refitting. Pt rode hard yesterday so thighs are a little sore today.     Pertinent History    L TTA, L TKA 2007, R TKA 2012, DM2, neuropathy, gout, OA, morbid obesity,     Limitations  Lifting;Standing;Walking;House hold activities    Patient Stated Goals  to use prosthesis walk outdoors, hiking, ride horse,     Currently in Pain?  No/denies                       [facility] Adult PT Treatment/Exercise - 01/18/18 1115      Ambulation/Gait   Ambulation/Gait  Yes    Ambulation/Gait Assistance  5: Supervision    Ambulation/Gait Assistance Details  AD initially progressing to no AD ambulating indoors with small simulated outdoor obstacle with weights under red mats, no LOB noted, VC's for pacing and weightshift.     Ambulation Distance (Feet)  500 Feet    Assistive device  Prosthesis    Gait Pattern  Step-through  pattern;Decreased stride length;Decreased stance time - left;Decreased weight shift to left   R Forearm Crutch   Ambulation Surface  Unlevel;Level;Indoor      Neuro Re-ed    Neuro Re-ed Details   Pt performing four square with mat behind/chairs on side/therapist in front for safety, pt presented with some unsteadyness while ambulating backwards and when changing direction.        PT Short Term Goals - 01/18/18 1106      PT SHORT TERM GOAL #1   Title  patient demonstrates proper donning & verbalizes proper cleaning.  (All STGs Target Date: 01/14/2018)    Baseline  01/11/18: met today    Status  Achieved      PT SHORT TERM GOAL #2   Title  Patient tolerates daily prosthesis wear >10 hrs total per day without skin or limb pain issues.     Baseline  01/11/18:  met today except for recent bruise on distal end of limb either from the fall or advanced riding of horse. The pt is to continue to monitor it.     Status  Achieved      PT SHORT TERM GOAL #3   Title  Patient ambulates 500' including grass with cane & prosthesis with supervision.     Baseline  01/11/18: met the distance today, min guard assist on compliant surfaces with single crutch support.     Status  Partially Met      PT SHORT TERM GOAL #4   Title  Patient negotiates ramps, curbs & stairs 1 rail with cane & prosthesis with supervision.     Baseline  01/11/18: met today    Status  Achieved      PT SHORT TERM GOAL #5   Title  Patient reaches 10" and picks up objects from floor without loss of balance with supervision.     Baseline  01/11/18: met today     Status  Achieved        PT Long Term Goals - 01/18/18 1106      PT LONG TERM GOAL #1   Title  Patient demonstrates & verbalizes proper prosthetic care to enable safe prosthesis use. (All LTGs Target Date: 02/18/2018)    Time  10    Period  Weeks    Status  On-going      PT LONG TERM GOAL #2   Title  Patient tolerates daily prosthesis wear >90% of awake hours without  skin issues or limb pain to enable function throughout her day.     Time  10    Period  Weeks      Status  On-going      PT LONG TERM GOAL #3   Title  Berg Balance >/= 45 /56  to indicate lower fall risk    Time  10    Period  Weeks    Status  On-going      PT LONG TERM GOAL #4   Title  Patient ambulates 1000' including grass with LRAD & prosthesis modified independent to enable mobility on farm.     Time  10    Period  Weeks    Status  On-going      PT LONG TERM GOAL #5   Title  Patient negotiates ramps, curbs & stairs with LRAD & prosthesis modified independent for community access.     Time  10    Period  Weeks    Status  On-going      PT LONG TERM GOAL #6   Title  Patient ambulates 200' with cane or less carrying 10# with prosthesis modified independent.    Time  10    Period  Weeks    Status  On-going      PT LONG TERM GOAL #7   Title  Patient verbalizes recommendations for mounting/dismounting her horses to achieve her goal. 01/11/18: Pt is independent with riding horses at this time.     Status  Achieved            Plan - 01/18/18 1251    Clinical Impression Statement  Todays skilled session focused on gait training with AD on simulated outdoor/uneven surfaces and high level balance activities to improve stability. Pt should benefit from continued PT progress towards goals.     Rehab Potential  Good    Clinical Impairments Affecting Rehab Potential  high fall risk, gait dependency, unknowledgeable in safe prosthesis use/care    PT Frequency  2x / week    PT Duration  Other (comment)   10 weeks   PT Treatment/Interventions  ADLs/Self Care Home Management;Canalith Repostioning;Gait training;DME Instruction;Stair training;Functional mobility training;Therapeutic activities;Therapeutic exercise;Balance training;Neuromuscular re-education;Patient/family education;Orthotic Fit/Training;Prosthetic Training;Manual techniques;Vestibular    PT Next Visit Plan  continue high  level balance training being midful of right ankle on complaint surfaces, gait training with hand held/cognitive distraction, on outdoor surfaces.     PT Home Exercise Plan  Access Code: [email or web address]     Consulted and Agree with Plan of Care  Patient       Patient will benefit from skilled therapeutic intervention in order to improve the following deficits and impairments:  Abnormal gait, Decreased activity tolerance, Decreased balance, Decreased endurance, Decreased mobility, Decreased strength, Dizziness, Impaired flexibility, Postural dysfunction, Prosthetic Dependency  Visit Diagnosis: Unsteadiness on feet  Other abnormalities of gait and mobility     Problem List Patient Active Problem List   Diagnosis Date Noted  . Osteomyelitis, lower leg (HCC) 10/19/2017  . MSSA bacteremia 10/19/2017  . Left ankle pain 07/27/2017  . Osteoarthritis 07/27/2017  . Septic shock (HCC) 07/13/2017  . Shortness of breath 07/13/2017  . Bacteremia due to Klebsiella pneumoniae 07/13/2017  . Allergic reaction to drug 07/13/2017  . Uncontrolled type 2 diabetes mellitus with hyperglycemia (HCC) 07/12/2017  . Idiopathic peripheral neuropathy   . Osteomyelitis of right hand (HCC)   . 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 MRSA infection   . 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 Controlled type 2 diabetes with neuropathy (HCC) 05/04/2016  . Open   wound of plantar aspect of foot 01/13/2016  . Morbid (severe) obesity due to excess calories (HCC) 01/17/2015  . Deformity of ankle and foot, acquired 12/06/2014  . Charcot's joint of foot, left 08/03/2014  . Hypersomnia 12/20/2013  . Obstructive sleep apnea syndrome, severe 12/20/2013  . Other fatigue 12/20/2013  . Snoring 12/20/2013  . 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 Postoperative anemia due to acute blood loss 06/25/2011  . Osteoarthritis  of knee 06/23/2011  . Obesity, morbid (more than 100 lbs over ideal weight or BMI > 40) (HCC) 06/23/2011   [name], PTA  [name] A Park 01/18/2018, 12:56 PM  [facility] [address] [place], [place], [address] Phone: [phone]   Fax:  [phone]  Name: [name] MRN: [id number] Date of Birth: 08/17/1965   

## 2018-01-20 ENCOUNTER — Ambulatory Visit: Payer: BLUE CROSS/BLUE SHIELD | Admitting: Physical Therapy

## 2018-01-20 ENCOUNTER — Encounter: Payer: Self-pay | Admitting: Physical Therapy

## 2018-01-20 DIAGNOSIS — M6281 Muscle weakness (generalized): Secondary | ICD-10-CM

## 2018-01-20 DIAGNOSIS — R2681 Unsteadiness on feet: Secondary | ICD-10-CM | POA: Diagnosis not present

## 2018-01-20 DIAGNOSIS — R2689 Other abnormalities of gait and mobility: Secondary | ICD-10-CM

## 2018-01-20 NOTE — Therapy (Signed)
[facility] [address] [place], [place], [address] Phone: [phone]   Fax:  [phone]  Physical Therapy Treatment  Patient Details  Name: [name] MRN: [id number] Date of Birth: [birth year] Referring Provider (PT): Rolena Infante, MD   Encounter Date: 01/20/2018  PT End of Session - 01/20/18 0855    Visit Number  11    Number of Visits  20    Date for PT Re-Evaluation  02/18/18    Authorization Type  [employer] $700 deductible & $2500 oop max met; pt covered 100%, visit limit - PT, OT, chiropractore 30 combined; zero used    Authorization - Visit Number  11    Authorization - Number of Visits  30    PT Start Time  0850    PT Stop Time  0930    PT Time Calculation (min)  40 min    Equipment Utilized During Treatment  Gait belt    Activity Tolerance  Patient tolerated treatment well;No increased pain    Behavior During Therapy  WFL for tasks assessed/performed       Past Medical History:  Diagnosis Date  . Arthritis   . Diabetes mellitus without complication ([place])   . DOE (dyspnea on exertion) 07/12/2017  . FUO (fever of unknown origin) 07/12/2017  . Gastroenteritis 04/2016  . Headache(784.0)    hormonal headache  . Low back pain 07/12/2017  . Neuropathy    bilateral feet  . Obese   . Pleuritic chest pain 07/12/2017  . Pneumonia    hx 20+ years ago  . Sleep apnea    bipap 1 yr  . Ulcer of foot ([place])    toe ulcer greater than a year ago    Past Surgical History:  Procedure Laterality Date  . AMPUTATION Right 06/11/2017   Procedure: RIGHT LONG FINGER AMPUTATION DIGIT;  Surgeon: Milly Jakob, MD;  Location: [facility];  Service: Orthopedics;  Laterality: Right;  . ANKLE FUSION Left 08/03/2014   Procedure: Left Tibiocalcaneal Fusion;  Surgeon: Newt Minion, MD;  Location: [facility];  Service: Orthopedics;  Laterality: Left;  . APPLICATION OF WOUND VAC Left 08/03/2014   Procedure: APPLICATION OF WOUND VAC;   Surgeon: Newt Minion, MD;  Location: [facility];  Service: Orthopedics;  Laterality: Left;  . I&D KNEE WITH POLY EXCHANGE Right 01/31/2013   Procedure: IRRIGATION AND DEBRIDEMENT KNEE WITH POLY EXCHANGE possible Antibiotic Spacer;  Surgeon: Garald Balding, MD;  Location: [facility];  Service: Orthopedics;  Laterality: Right;  . INCISION AND DRAINAGE Right 01/31/2013   ANTIBIOTIC SPACER  RIGHT KNEE  DR Durward Fortes   . KNEE ARTHROSCOPY     left knee  . ORIF ANKLE FRACTURE Left 03/16/2014   Procedure: Foot Excision Charcot Collapse,  Internal Fixation;  Surgeon: Newt Minion, MD;  Location: [facility];  Service: Orthopedics;  Laterality: Left;  . REPLACEMENT TOTAL KNEE     left  . TOTAL KNEE ARTHROPLASTY  06/23/2011   Procedure: TOTAL KNEE ARTHROPLASTY;  Surgeon: Garald Balding, MD;  Location: [facility];  Service: Orthopedics;  Laterality: Right;  RIGHT TOTAL KNEE REPLACEMENT     There were no vitals filed for this visit.  Subjective Assessment - 01/20/18 0854    Subjective  No new falls. No pain. Was able to clip a horse for the first time with no issues getting on/off stool or balance with grooming the horse.     Pertinent History  L TTA, L TKA  2007, R TKA 2012, DM2, neuropathy, gout, OA, morbid obesity,     Limitations  Lifting;Standing;Walking;House hold activities    Patient Stated Goals  to use prosthesis walk outdoors, hiking, ride horse,     Currently in Pain?  No/denies    Pain Score  0-No pain         [facility] Adult PT Treatment/Exercise - 01/20/18 0856      Transfers   Transfers  Sit to Stand;Stand to Sit    Sit to Stand  6: Modified independent (Device/Increase time)    Stand to Sit  6: Modified independent (Device/Increase time)      Ambulation/Gait   Ambulation/Gait  Yes    Ambulation/Gait Assistance  5: Supervision    Ambulation/Gait Assistance Details  used crutch intermittently with walking into/out of gym. no AD, prosthesis only around gym.     Ambulation Distance (Feet)  --    around gym with activities with no AD   Assistive device  Prosthesis    Gait Pattern  Step-through pattern;Decreased stride length;Decreased stance time - left;Decreased weight shift to left    Ambulation Surface  Level;Indoor    Gait Comments  for coordination/balance reactions: along ~40 foot hallway fwd gait with head movements left<>fwd<>right, then up<>fwd<>down x 2 laps each. min guard assist with no veering noted, Linebaugh decr in gait speed noted.        High Level Balance   High Level Balance Activities  Side stepping;Marching forwards;Marching backwards    High Level Balance Comments  on red mats: side stepping left<>right x 2 laps, progressing to side stepping left<>right with partial squat between steps all with min guard assist for balance with cues on ex form/technique; fwd/bwd marching x 3 laps each way with light UE support on counter top, min guard assist with cues on knee height and posture.       Prosthetics   Prosthetic Care Comments   Ardine Eng and had larger pre tibial pads added to socket. He also cut the back down to it digging into the popliteal fossa with bending knee while grooming horses.     Current prosthetic wear tolerance (days/week)   daily     Current prosthetic wear tolerance (#hours/day)   all awake hours, drying as needed    Residual limb condition   intact with no issues    Donning Prosthesis  Modified independent (device/increased time)    Doffing Prosthesis  Modified independent (device/increased time)          Balance Exercises - 01/20/18 0922      Balance Exercises: Standing   SLS with Vectors  Foam/compliant surface;Other reps (comment);Limitations    Rockerboard  Anterior/posterior;EO;EC;30 seconds      Balance Exercises: Standing   SLS with Vectors Limitations  on red mat with 2 tall cones: alternating fwd toe taps, progressing toward alternating cross toe taps. cues on stance position, posture and weight shifting to assist with balance. min  guard assist with light UE touch to counter for balance.               Rebounder Limitations  rockerboad in ant/post direction in parallel bar: holding the board steady- alternating UE raises, progressing to bil UE raises with min guard to min assist for balance, cues on posture/weight shifting to assist with balance; with arms at sides- EC no head movements x 3 reps. min guard assist with cues on posture/weight shifting for balance.  PT Short Term Goals - 01/18/18 1106      PT SHORT TERM GOAL #1   Title  patient demonstrates proper donning & verbalizes proper cleaning.  (All STGs Target Date: 01/14/2018)    Baseline  01/11/18: met today    Status  Achieved      PT SHORT TERM GOAL #2   Title  Patient tolerates daily prosthesis wear >10 hrs total per day without skin or limb pain issues.     Baseline  01/11/18:  met today except for recent bruise on distal end of limb either from the fall or advanced riding of horse. The pt is to continue to monitor it.     Status  Achieved      PT SHORT TERM GOAL #3   Title  Patient ambulates 500' including grass with cane & prosthesis with supervision.     Baseline  01/11/18: met the distance today, min guard assist on compliant surfaces with single crutch support.     Status  Partially Met      PT SHORT TERM GOAL #4   Title  Patient negotiates ramps, curbs & stairs 1 rail with cane & prosthesis with supervision.     Baseline  01/11/18: met today    Status  Achieved      PT SHORT TERM GOAL #5   Title  Patient reaches 10" and picks up objects from floor without loss of balance with supervision.     Baseline  01/11/18: met today     Status  Achieved        PT Long Term Goals - 01/18/18 1106      PT LONG TERM GOAL #1   Title  Patient demonstrates & verbalizes proper prosthetic care to enable safe prosthesis use. (All LTGs Target Date: 02/18/2018)    Time  10    Period  Weeks    Status  On-going      PT LONG TERM  GOAL #2   Title  Patient tolerates daily prosthesis wear >90% of awake hours without skin issues or limb pain to enable function throughout her day.     Time  10    Period  Weeks    Status  On-going      PT LONG TERM GOAL #3   Title  Berg Balance >/= 45 /56  to indicate lower fall risk    Time  10    Period  Weeks    Status  On-going      PT LONG TERM GOAL #4   Title  Patient ambulates 1000' including grass with LRAD & prosthesis modified independent to enable mobility on farm.     Time  10    Period  Weeks    Status  On-going      PT LONG TERM GOAL #5   Title  Patient negotiates ramps, curbs & stairs with LRAD & prosthesis modified independent for community access.     Time  10    Period  Weeks    Status  On-going      PT LONG TERM GOAL #6   Title  Patient ambulates 200' with cane or less carrying 10# with prosthesis modified independent.    Time  10    Period  Weeks    Status  On-going      PT LONG TERM GOAL #7   Title  Patient verbalizes recommendations for mounting/dismounting her horses to achieve her goal. 01/11/18: Pt is independent with riding horses at  this time.     Status  Achieved            Plan - 01/20/18 0856    Clinical Impression Statement  Today's skilled session continued to focus on high level balance reactions with rest breaks needed due to back soreness from gooming her 1st horse yesterday. The pt is progressing well toward goals and anticpate she will be ready for discharge soon as pt reports she is back to doing all the things she wanted therapy to help her get back too.     Rehab Potential  Good    Clinical Impairments Affecting Rehab Potential  high fall risk, gait dependency, unknowledgeable in safe prosthesis use/care    PT Frequency  2x / week    PT Duration  Other (comment)   10 weeks   PT Treatment/Interventions  ADLs/Self Care Home Management;Canalith Repostioning;Gait training;DME Instruction;Stair training;Functional mobility  training;Therapeutic activities;Therapeutic exercise;Balance training;Neuromuscular re-education;Patient/family education;Orthotic Fit/Training;Prosthetic Training;Manual techniques;Vestibular    PT Next Visit Plan  discuss with primary PT progress toward date, begin to assess LTGs, ? discharge in next 2-3 visits.     PT Home Exercise Plan  Access Code: [email or web address]     Consulted and Agree with Plan of Care  Patient       Patient will benefit from skilled therapeutic intervention in order to improve the following deficits and impairments:  Abnormal gait, Decreased activity tolerance, Decreased balance, Decreased endurance, Decreased mobility, Decreased strength, Dizziness, Impaired flexibility, Postural dysfunction, Prosthetic Dependency  Visit Diagnosis: Unsteadiness on feet  Other abnormalities of gait and mobility  Muscle weakness (generalized)     Problem List Patient Active Problem List   Diagnosis Date Noted  . Osteomyelitis, lower leg ([place]) 10/19/2017  . MSSA bacteremia 10/19/2017  . Left ankle pain 07/27/2017  . Osteoarthritis 07/27/2017  . Septic shock ([place]) 07/13/2017  . Shortness of breath 07/13/2017  . Bacteremia due to Klebsiella pneumoniae 07/13/2017  . Allergic reaction to drug 07/13/2017  . Uncontrolled type 2 diabetes mellitus with hyperglycemia ([place]) 07/12/2017  . Idiopathic peripheral neuropathy   . Osteomyelitis of right hand ([place])   . Osteomyelitis of finger of right hand ([place]) 06/08/2017  . MRSA infection   . 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 Controlled type 2 diabetes with neuropathy ([place]) 05/04/2016  . Open wound of plantar aspect of foot 01/13/2016  . Morbid (severe) obesity due to excess calories ([place]) 01/17/2015  . Deformity of ankle and foot, acquired 12/06/2014  . Charcot's joint of foot, left 08/03/2014  . Hypersomnia 12/20/2013  . Obstructive sleep apnea  syndrome, severe 12/20/2013  . Other fatigue 12/20/2013  . Snoring 12/20/2013  . Prosthetic joint infection ([place]) 01/31/2013  . Postoperative anemia due to acute blood loss 06/25/2011  . Osteoarthritis of knee 06/23/2011  . Obesity, morbid (more than 100 lbs over ideal weight or BMI > 40) (HCC) 06/23/2011    Willow Ora, PTA, [facility] [address] [place],  [address] [phone] 01/20/18, 9:35 PM   Name: [name] MRN: [id number] Date of Birth: 1965-10-01

## 2018-01-24 ENCOUNTER — Encounter: Payer: BLUE CROSS/BLUE SHIELD | Admitting: Physical Therapy

## 2018-01-25 ENCOUNTER — Ambulatory Visit: Payer: BLUE CROSS/BLUE SHIELD | Attending: Physical Medicine & Rehabilitation | Admitting: Physical Therapy

## 2018-01-25 ENCOUNTER — Encounter: Payer: Self-pay | Admitting: Physical Therapy

## 2018-01-25 DIAGNOSIS — R293 Abnormal posture: Secondary | ICD-10-CM | POA: Insufficient documentation

## 2018-01-25 DIAGNOSIS — R2689 Other abnormalities of gait and mobility: Secondary | ICD-10-CM | POA: Diagnosis present

## 2018-01-25 DIAGNOSIS — R2681 Unsteadiness on feet: Secondary | ICD-10-CM | POA: Insufficient documentation

## 2018-01-25 DIAGNOSIS — M6281 Muscle weakness (generalized): Secondary | ICD-10-CM | POA: Insufficient documentation

## 2018-01-25 NOTE — Therapy (Signed)
[facility] [address] [place], [place], [address] Phone: [phone]   Fax:  [phone]  Physical Therapy Treatment  Patient Details  Name: [name] MRN: [id number] Date of Birth: 02-20-1966 Referring Provider (PT): Rolena Infante, MD   Encounter Date: 01/25/2018  PT End of Session - 01/25/18 1106    Visit Number  12    Number of Visits  20    Date for PT Re-Evaluation  02/18/18    Authorization Type  [employer] $700 deductible & $2500 oop max met; pt covered 100%, visit limit - PT, OT, chiropractore 30 combined; zero used    Authorization - Visit Number  12    Authorization - Number of Visits  30    PT Start Time  1102    PT Stop Time  1141    PT Time Calculation (min)  39 min    Equipment Utilized During Treatment  Gait belt    Activity Tolerance  Patient tolerated treatment well;No increased pain    Behavior During Therapy  WFL for tasks assessed/performed       Past Medical History:  Diagnosis Date  . Arthritis   . Diabetes mellitus without complication ([place])   . DOE (dyspnea on exertion) 07/12/2017  . FUO (fever of unknown origin) 07/12/2017  . Gastroenteritis 04/2016  . Headache(784.0)    hormonal headache  . Low back pain 07/12/2017  . Neuropathy    bilateral feet  . Obese   . Pleuritic chest pain 07/12/2017  . Pneumonia    hx 20+ years ago  . Sleep apnea    bipap 1 yr  . Ulcer of foot ([place])    toe ulcer greater than a year ago    Past Surgical History:  Procedure Laterality Date  . AMPUTATION Right 06/11/2017   Procedure: RIGHT LONG FINGER AMPUTATION DIGIT;  Surgeon: Milly Jakob, MD;  Location: [facility];  Service: Orthopedics;  Laterality: Right;  . ANKLE FUSION Left 08/03/2014   Procedure: Left Tibiocalcaneal Fusion;  Surgeon: Newt Minion, MD;  Location: [facility];  Service: Orthopedics;  Laterality: Left;  . APPLICATION OF WOUND VAC Left 08/03/2014   Procedure: APPLICATION OF WOUND VAC;   Surgeon: Newt Minion, MD;  Location: [facility];  Service: Orthopedics;  Laterality: Left;  . I&D KNEE WITH POLY EXCHANGE Right 01/31/2013   Procedure: IRRIGATION AND DEBRIDEMENT KNEE WITH POLY EXCHANGE possible Antibiotic Spacer;  Surgeon: Garald Balding, MD;  Location: [facility];  Service: Orthopedics;  Laterality: Right;  . INCISION AND DRAINAGE Right 01/31/2013   ANTIBIOTIC SPACER  RIGHT KNEE  DR Durward Fortes   . KNEE ARTHROSCOPY     left knee  . ORIF ANKLE FRACTURE Left 03/16/2014   Procedure: Foot Excision Charcot Collapse,  Internal Fixation;  Surgeon: Newt Minion, MD;  Location: [facility];  Service: Orthopedics;  Laterality: Left;  . REPLACEMENT TOTAL KNEE     left  . TOTAL KNEE ARTHROPLASTY  06/23/2011   Procedure: TOTAL KNEE ARTHROPLASTY;  Surgeon: Garald Balding, MD;  Location: [facility];  Service: Orthopedics;  Laterality: Right;  RIGHT TOTAL KNEE REPLACEMENT     There were no vitals filed for this visit.  Subjective Assessment - 01/25/18 1104    Subjective  No new falls. Does have a black eye from where a horse raised it's head and caught the eye "just right". Going without the cane/crutches for most all the times, only taking crutches with her in evening when  tired or at the barn when going to be doing things for extra stability.     Pertinent History  L TTA, L TKA 2007, R TKA 2012, DM2, neuropathy, gout, OA, morbid obesity,     Limitations  Lifting;Standing;Walking;House hold activities    Patient Stated Goals  to use prosthesis walk outdoors, hiking, ride horse,     Currently in Pain?  No/denies    Pain Score  0-No pain         [facility] PT Assessment - 01/25/18 1138      Berg Balance Test   Sit to Stand  Able to stand without using hands and stabilize independently    Standing Unsupported  Able to stand safely 2 minutes    Sitting with Back Unsupported but Feet Supported on Floor or Stool  Able to sit safely and securely 2 minutes    Stand to Sit  Sits safely with minimal use of  hands    Transfers  Able to transfer safely, [place] use of hands    Standing Unsupported with Eyes Closed  Able to stand 10 seconds safely    Standing Ubsupported with Feet Together  Able to place feet together independently and stand 1 minute safely    From Standing, Reach Forward with Outstretched Arm  Can reach confidently >25 cm (10")    From Standing Position, Pick up Object from Floor  Able to pick up shoe safely and easily    From Standing Position, Turn to Look Behind Over each Shoulder  Looks behind from both sides and weight shifts well    Turn 360 Degrees  Able to turn 360 degrees safely in 4 seconds or less    Standing Unsupported, Alternately Place Feet on Step/Stool  Able to stand independently and safely and complete 8 steps in 20 seconds   18.03 sec's with supervision, no balance loss   Standing Unsupported, One Foot in Front  Able to plae foot ahead of the other independently and hold 30 seconds    Standing on One Leg  Tries to lift leg/unable to hold 3 seconds but remains standing independently    Total Score  52          [facility] Adult PT Treatment/Exercise - 01/25/18 1113      Transfers   Transfers  Sit to Stand;Stand to Sit    Sit to Stand  6: Modified independent (Device/Increase time)    Stand to Sit  6: Modified independent (Device/Increase time)      Ambulation/Gait   Ambulation/Gait  Yes    Ambulation/Gait Assistance  6: Modified independent (Device/Increase time)    Ambulation/Gait Assistance Details  no AD for 500 feet outdoors on paved surfaces (no grass, etc due to very wet surfaces today) , plus another 500 feet indoors at Mod I level.     Ambulation Distance (Feet)  1000 Feet   x1   Assistive device  Prosthesis    Gait Pattern  Step-through pattern;Decreased stride length    Ambulation Surface  Level;Indoor;Unlevel;Outdoor;Paved    Ramp  6: Modified independent (Device)    Ramp Details (indicate cue type and reason)  prosthesis only    Curb  6: Modified  independent (Device/increase time)    Curb Details (indicate cue type and reason)  prosthesis only      Therapeutic Activites    Therapeutic Activities  Other Therapeutic Activities    Other Therapeutic Activities  demo'd technique for pushing/pulling loaded cart. pt able to return demo with  fully weighted cart in gym for ~10 feet each way. supervision for safety.       Prosthetics   Current prosthetic wear tolerance (days/week)   daily     Current prosthetic wear tolerance (#hours/day)   all awake hours, drying as needed    Residual limb condition   intact with no issues    Donning Prosthesis  Modified independent (device/increased time)    Doffing Prosthesis  Modified independent (device/increased time)           PT Short Term Goals - 01/18/18 1106      PT SHORT TERM GOAL #1   Title  patient demonstrates proper donning & verbalizes proper cleaning.  (All STGs Target Date: 01/14/2018)    Baseline  01/11/18: met today    Status  Achieved      PT SHORT TERM GOAL #2   Title  Patient tolerates daily prosthesis wear >10 hrs total per day without skin or limb pain issues.     Baseline  01/11/18:  met today except for recent bruise on distal end of limb either from the fall or advanced riding of horse. The pt is to continue to monitor it.     Status  Achieved      PT SHORT TERM GOAL #3   Title  Patient ambulates 500' including grass with cane & prosthesis with supervision.     Baseline  01/11/18: met the distance today, min guard assist on compliant surfaces with single crutch support.     Status  Partially Met      PT SHORT TERM GOAL #4   Title  Patient negotiates ramps, curbs & stairs 1 rail with cane & prosthesis with supervision.     Baseline  01/11/18: met today    Status  Achieved      PT SHORT TERM GOAL #5   Title  Patient reaches 10" and picks up objects from floor without loss of balance with supervision.     Baseline  01/11/18: met today     Status  Achieved        PT  Long Term Goals - 01/25/18 1106      PT LONG TERM GOAL #1   Title  Patient demonstrates & verbalizes proper prosthetic care to enable safe prosthesis use. (All LTGs Target Date: 02/18/2018)    Baseline  01/25/18: met as of today    Status  Achieved      PT LONG TERM GOAL #2   Title  Patient tolerates daily prosthesis wear >90% of awake hours without skin issues or limb pain to enable function throughout her day.     Baseline  01/25/18: met as of today    Status  Achieved      PT LONG TERM GOAL #3   Title  Berg Balance >/= 45 /56  to indicate lower fall risk    Baseline  01/25/18: 52/56 scored today    Time  --    Period  --    Status  Achieved      PT LONG TERM GOAL #4   Title  Patient ambulates 1000' including grass with LRAD & prosthesis modified independent to enable mobility on farm.     Baseline  01/25/18: met today with no AD. pt also reports doing this at home with no device vs use of single crutch if doing more manual work.     Time  --    Period  --    Status  Achieved  PT LONG TERM GOAL #5   Title  Patient negotiates ramps, curbs & stairs with LRAD & prosthesis modified independent for community access.     Baseline  01/25/18: met with ramp/curb today, need to check stairs portion    Time  10    Period  Weeks    Status  Partially Met      PT LONG TERM GOAL #6   Title  Patient ambulates 200' with cane or less carrying 10# with prosthesis modified independent.    Time  10    Period  Weeks    Status  On-going      PT LONG TERM GOAL #7   Title  Patient verbalizes recommendations for mounting/dismounting her horses to achieve her goal. 01/11/18: Pt is independent with riding horses at this time.     Status  Achieved            Plan - 01/25/18 1106    Clinical Impression Statement  Pt arrived stating she feels she is back to doing all the things she wanted to do with prosthesis and questions need to continue with PT. Began to assess LTGs with goals checked met  today. See's primary PT for next visist for remainder of goals to be checked and final decision to be made. The pt is to bring in any additional questions or issues she comes up with between now and next visit.     Rehab Potential  Good    Clinical Impairments Affecting Rehab Potential  high fall risk, gait dependency, unknowledgeable in safe prosthesis use/care    PT Frequency  2x / week    PT Duration  Other (comment)   10 weeks   PT Treatment/Interventions  ADLs/Self Care Home Management;Canalith Repostioning;Gait training;DME Instruction;Stair training;Functional mobility training;Therapeutic activities;Therapeutic exercise;Balance training;Neuromuscular re-education;Patient/family education;Orthotic Fit/Training;Prosthetic Training;Manual techniques;Vestibular    PT Next Visit Plan  assess remaining LTGs, ? discharge early or continue toward any deficits that remain (pt to discuss with primary PT any she comes up with)    PT Home Exercise Plan  Access Code: [email or web address]     Consulted and Agree with Plan of Care  Patient       Patient will benefit from skilled therapeutic intervention in order to improve the following deficits and impairments:  Abnormal gait, Decreased activity tolerance, Decreased balance, Decreased endurance, Decreased mobility, Decreased strength, Dizziness, Impaired flexibility, Postural dysfunction, Prosthetic Dependency  Visit Diagnosis: Unsteadiness on feet  Other abnormalities of gait and mobility  Muscle weakness (generalized)  Abnormal posture     Problem List Patient Active Problem List   Diagnosis Date Noted  . Osteomyelitis, lower leg ([place]) 10/19/2017  . MSSA bacteremia 10/19/2017  . Left ankle pain 07/27/2017  . Osteoarthritis 07/27/2017  . Septic shock ([place]) 07/13/2017  . Shortness of breath 07/13/2017  . Bacteremia due to Klebsiella pneumoniae 07/13/2017  . Allergic reaction to drug 07/13/2017  . Uncontrolled type 2 diabetes mellitus with  hyperglycemia ([place]) 07/12/2017  . Idiopathic peripheral neuropathy   . Osteomyelitis of right hand ([place])   . Osteomyelitis of finger of right hand ([facility]) 06/08/2017  . MRSA infection   . 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 Controlled type 2 diabetes with neuropathy ([place]) 05/04/2016  . Open wound of plantar aspect of foot 01/13/2016  . Morbid (severe) obesity due to excess calories ([employer]) 01/17/2015  . Deformity of ankle and foot, acquired 12/06/2014  . Charcot's joint  of foot, left 08/03/2014  . Hypersomnia 12/20/2013  . Obstructive sleep apnea syndrome, severe 12/20/2013  . Other fatigue 12/20/2013  . Snoring 12/20/2013  . Prosthetic joint infection ([place]) 01/31/2013  . Postoperative anemia due to acute blood loss 06/25/2011  . Osteoarthritis of knee 06/23/2011  . Obesity, morbid (more than 100 lbs over ideal weight or BMI > 40) ([facility]) 06/23/2011    Willow Ora, PTA, [facility] [address], [place], [place] [address] [phone] 01/26/18, 12:17 PM   Name: [name] MRN: [id number] Date of Birth: 06-08-1965

## 2018-01-27 ENCOUNTER — Ambulatory Visit: Payer: BLUE CROSS/BLUE SHIELD | Admitting: Physical Therapy

## 2018-01-27 ENCOUNTER — Encounter: Payer: Self-pay | Admitting: Physical Therapy

## 2018-01-27 DIAGNOSIS — R2681 Unsteadiness on feet: Secondary | ICD-10-CM | POA: Diagnosis not present

## 2018-01-27 DIAGNOSIS — M6281 Muscle weakness (generalized): Secondary | ICD-10-CM

## 2018-01-27 DIAGNOSIS — R293 Abnormal posture: Secondary | ICD-10-CM

## 2018-01-27 DIAGNOSIS — R2689 Other abnormalities of gait and mobility: Secondary | ICD-10-CM

## 2018-01-27 NOTE — Therapy (Signed)
[facility] [address] [place], [place], [address] Phone: [phone]   Fax:  [phone] PHYSICAL THERAPY DISCHARGE SUMMARY  Visits from Start of Care: 13  Current functional level related to goals / functional outcomes: See below   Remaining deficits: See below   Education / Equipment: Prosthetic care & HEP   Plan: Patient agrees to discharge.  Patient goals were met. Patient is being discharged due to meeting the stated rehab goals.  ?????        Physical Therapy Treatment  Patient Details  Name: [name] MRN: [id number] Date of Birth: 1966-04-09 Referring Provider (PT): Rolena Infante, MD   Encounter Date: 01/27/2018  PT End of Session - 01/27/18 1006    Visit Number  13    Number of Visits  20    Date for PT Re-Evaluation  02/18/18    Authorization Type  [employer] $700 deductible & $2500 oop max met; pt covered 100%, visit limit - PT, OT, chiropractore 30 combined; zero used    Authorization - Visit Number  13    Authorization - Number of Visits  30    PT Start Time  0935    PT Stop Time  1000    PT Time Calculation (min)  25 min    Equipment Utilized During Treatment  Gait belt    Activity Tolerance  Patient tolerated treatment well;No increased pain    Behavior During Therapy  WFL for tasks assessed/performed       Past Medical History:  Diagnosis Date  . Arthritis   . Diabetes mellitus without complication ([place])   . DOE (dyspnea on exertion) 07/12/2017  . FUO (fever of unknown origin) 07/12/2017  . Gastroenteritis 04/2016  . Headache(784.0)    hormonal headache  . Low back pain 07/12/2017  . Neuropathy    bilateral feet  . Obese   . Pleuritic chest pain 07/12/2017  . Pneumonia    hx 20+ years ago  . Sleep apnea    bipap 1 yr  . Ulcer of foot ([place])    toe ulcer greater than a year ago    Past Surgical History:  Procedure Laterality Date  . AMPUTATION Right 06/11/2017   Procedure: RIGHT LONG FINGER AMPUTATION DIGIT;  Surgeon: Milly Jakob, MD;  Location: [facility];  Service: Orthopedics;  Laterality: Right;  . ANKLE FUSION Left 08/03/2014   Procedure: Left Tibiocalcaneal Fusion;  Surgeon: Newt Minion, MD;  Location: [facility];  Service: Orthopedics;  Laterality: Left;  . APPLICATION OF WOUND VAC Left 08/03/2014   Procedure: APPLICATION OF WOUND VAC;  Surgeon: Newt Minion, MD;  Location: [facility];  Service: Orthopedics;  Laterality: Left;  . I&D KNEE WITH POLY EXCHANGE Right 01/31/2013   Procedure: IRRIGATION AND DEBRIDEMENT KNEE WITH POLY EXCHANGE possible Antibiotic Spacer;  Surgeon: Garald Balding, MD;  Location: [facility];  Service: Orthopedics;  Laterality: Right;  . INCISION AND DRAINAGE Right 01/31/2013   ANTIBIOTIC SPACER  RIGHT KNEE  DR Durward Fortes   . KNEE ARTHROSCOPY     left knee  . ORIF ANKLE FRACTURE Left 03/16/2014   Procedure: Foot Excision Charcot Collapse,  Internal Fixation;  Surgeon: Newt Minion, MD;  Location: [facility];  Service: Orthopedics;  Laterality: Left;  . REPLACEMENT TOTAL KNEE     left  . TOTAL KNEE ARTHROPLASTY  06/23/2011   Procedure: TOTAL KNEE ARTHROPLASTY;  Surgeon: Garald Balding, MD;  Location: [facility];  Service: Orthopedics;  Laterality: Right;  RIGHT TOTAL KNEE REPLACEMENT     There were no vitals filed for this visit.  Subjective Assessment - 01/27/18 0935    Subjective  No falls or issues. She reports doing most of her activities including clipping horse.   (Pended)     Pertinent History  L TTA, L TKA 2007, R TKA 2012, DM2, neuropathy, gout, OA, morbid obesity,   (Pended)     Limitations  Lifting;Standing;Walking;House hold activities  (Pended)     Patient Stated Goals  to use prosthesis walk outdoors, hiking, ride horse,   (Pended)     Currently in Pain?  No/denies  [employer])           Prosthetics Assessment - 01/27/18 0935      Prosthetics   Prosthetic Care Independent with  Skin check;Residual limb care;Care of  non-amputated limb;Prosthetic cleaning;Ply sock cleaning;Correct ply sock adjustment;Proper wear schedule/adjustment;Proper weight-bearing schedule/adjustment;Other (comment)   follow-ups with prosthetist   Prosthetic Care Comments   PT cued on washing liners with bathing & donning/doffing prosthesis at edge of bed    Donning prosthesis   Modified independent (Device/Increase time)    Doffing prosthesis   Modified independent (Device/Increase time)                  [facility] Adult PT Treatment/Exercise - 01/27/18 0935      Transfers   Transfers  Sit to Stand;Stand to Sit    Sit to Stand  6: Modified independent (Device/Increase time);Without upper extremity assist;From chair/3-in-1    Stand to Sit  6: Modified independent (Device/Increase time);Without upper extremity assist;To chair/3-in-1      Ambulation/Gait   Ambulation/Gait  Yes    Ambulation/Gait Assistance  6: Modified independent (Device/Increase time)    Ambulation/Gait Assistance Details  prosthesis only    Assistive device  Prosthesis;None    Gait Pattern  Step-through pattern;Decreased stride length    Gait velocity  3.12 ft/sec    Stairs  Yes    Stairs Assistance  6: Modified independent (Device/Increase time)    Stairs Assistance Details (indicate cue type and reason)  verbal & demo cues on technique for stairs with no rail available. Pt return demo understanding with light hand hold assist.     Stair Management Technique  One rail Right;Alternating pattern;Forwards;Sideways;Step to pattern    Number of Stairs  4   5 reps   Height of Stairs  6    Ramp  6: Modified independent (Device)   prosthesis only   Curb  6: Modified independent (Device/increase time)   prosthesis only     Therapeutic Activites    Therapeutic Activities  Lifting    Lifting  PT demo technique for lifting boxes. Pt return demo understanding 10# box 5 reps with proper technique      Prosthetics   Current prosthetic wear tolerance  (days/week)   daily     Current prosthetic wear tolerance (#hours/day)   all awake hours, drying as needed    Residual limb condition   intact with no issues               PT Short Term Goals - 01/18/18 1106      PT SHORT TERM GOAL #1   Title  patient demonstrates proper donning & verbalizes proper cleaning.  (All STGs Target Date: 01/14/2018)    Baseline  01/11/18: met today    Status  Achieved      PT SHORT TERM GOAL #2   Title  Patient tolerates daily prosthesis wear >10 hrs total per day without skin or limb pain issues.     Baseline  01/11/18:  met today except for recent bruise on distal end of limb either from the fall or advanced riding of horse. The pt is to continue to monitor it.     Status  Achieved      PT SHORT TERM GOAL #3   Title  Patient ambulates 500' including grass with cane & prosthesis with supervision.     Baseline  01/11/18: met the distance today, min guard assist on compliant surfaces with single crutch support.     Status  Partially Met      PT SHORT TERM GOAL #4   Title  Patient negotiates ramps, curbs & stairs 1 rail with cane & prosthesis with supervision.     Baseline  01/11/18: met today    Status  Achieved      PT SHORT TERM GOAL #5   Title  Patient reaches 10" and picks up objects from floor without loss of balance with supervision.     Baseline  01/11/18: met today     Status  Achieved        PT Long Term Goals - 01/27/18 1006      PT LONG TERM GOAL #1   Title  Patient demonstrates & verbalizes proper prosthetic care to enable safe prosthesis use. (All LTGs Target Date: 02/18/2018)    Baseline  01/25/18: met as of today    Status  Achieved      PT LONG TERM GOAL #2   Title  Patient tolerates daily prosthesis wear >90% of awake hours without skin issues or limb pain to enable function throughout her day.     Baseline  01/25/18: met as of today    Status  Achieved      PT LONG TERM GOAL #3   Title  Berg Balance >/= 45 /56  to indicate  lower fall risk    Baseline  01/25/18: 52/56 scored today    Status  Achieved      PT LONG TERM GOAL #4   Title  Patient ambulates 1000' including grass with LRAD & prosthesis modified independent to enable mobility on farm.     Baseline  01/25/18: met today with no AD. pt also reports doing this at home with no device vs use of single crutch if doing more manual work.     Status  Achieved      PT LONG TERM GOAL #5   Title  Patient negotiates ramps, curbs & stairs with LRAD & prosthesis modified independent for community access.     Baseline  01/25/18: met with ramp/curb  01/27/2018 MET stairs with single rail alternate pattern and no rail Hand hold step-to    Time  10    Period  Weeks    Status  Achieved      PT LONG TERM GOAL #6   Title  Patient ambulates 200' with cane or less carrying 10# with prosthesis modified independent.    Baseline  MET 01/27/2018    Time  10    Period  Weeks    Status  Achieved      PT LONG TERM GOAL #7   Title  Patient verbalizes recommendations for mounting/dismounting her horses to achieve her goal. 01/11/18: Pt is independent with riding horses at this time.     Baseline  MET 01/27/2018 Pt reports no issues with mounting/dismounting or grooming her  horses.     Status  Achieved            Plan - 01/27/18 1009    Clinical Impression Statement  Patient met all 7 LTGs. She is functioning with prosthesis only for full community and has returned to her horse riding/grooming activities. Patient has low fall risk as noted by improved Berg Balance score.     Rehab Potential  Good    Clinical Impairments Affecting Rehab Potential  high fall risk, gait dependency, unknowledgeable in safe prosthesis use/care    PT Frequency  2x / week    PT Duration  Other (comment)   10 weeks   PT Treatment/Interventions  ADLs/Self Care Home Management;Canalith Repostioning;Gait training;DME Instruction;Stair training;Functional mobility training;Therapeutic  activities;Therapeutic exercise;Balance training;Neuromuscular re-education;Patient/family education;Orthotic Fit/Training;Prosthetic Training;Manual techniques;Vestibular    PT Next Visit Plan  discharge PT    PT Home Exercise Plan  Access Code: [email or web address]     [id number] and Agree with Plan of Care  Patient       Patient will benefit from skilled therapeutic intervention in order to improve the following deficits and impairments:  Abnormal gait, Decreased activity tolerance, Decreased balance, Decreased endurance, Decreased mobility, Decreased strength, Dizziness, Impaired flexibility, Postural dysfunction, Prosthetic Dependency  Visit Diagnosis: Unsteadiness on feet  Other abnormalities of gait and mobility  Muscle weakness (generalized)  Abnormal posture     Problem List Patient Active Problem List   Diagnosis Date Noted  . Osteomyelitis, lower leg ([place]) 10/19/2017  . MSSA bacteremia 10/19/2017  . Left ankle pain 07/27/2017  . Osteoarthritis 07/27/2017  . Septic shock ([place]) 07/13/2017  . Shortness of breath 07/13/2017  . Bacteremia due to Klebsiella pneumoniae 07/13/2017  . Allergic reaction to drug 07/13/2017  . Uncontrolled type 2 diabetes mellitus with hyperglycemia ([place]) 07/12/2017  . Idiopathic peripheral neuropathy   . Osteomyelitis of right hand ([place])   . Osteomyelitis of finger of right hand ([place]) 06/08/2017  . MRSA infection   . 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 Controlled type 2 diabetes with neuropathy ([place]) 05/04/2016  . Open wound of plantar aspect of foot 01/13/2016  . Morbid (severe) obesity due to excess calories ([place]) 01/17/2015  . Deformity of ankle and foot, acquired 12/06/2014  . Charcot's joint of foot, left 08/03/2014  . Hypersomnia 12/20/2013  . Obstructive sleep apnea syndrome, severe 12/20/2013  . Other fatigue 12/20/2013  . Snoring 12/20/2013  .  Prosthetic joint infection ([place]) 01/31/2013  . Postoperative anemia due to acute blood loss 06/25/2011  . Osteoarthritis of knee 06/23/2011  . Obesity, morbid (more than 100 lbs over ideal weight or BMI > 40) ([facility]) 06/23/2011    WALDRON,ROBIN PT, DPT 01/27/2018, 10:16 AM  [facility] [address] [place], [place], [address] Phone: [phone]   Fax:  [phone]  Name: [name] MRN: [id number] Date of Birth: 1965/11/13

## 2018-01-31 ENCOUNTER — Encounter: Payer: BLUE CROSS/BLUE SHIELD | Admitting: Physical Therapy

## 2018-02-02 ENCOUNTER — Encounter: Payer: BLUE CROSS/BLUE SHIELD | Admitting: Physical Therapy

## 2018-02-07 ENCOUNTER — Encounter: Payer: BLUE CROSS/BLUE SHIELD | Admitting: Physical Therapy

## 2018-02-10 ENCOUNTER — Encounter: Payer: BLUE CROSS/BLUE SHIELD | Admitting: Physical Therapy

## 2018-02-14 ENCOUNTER — Encounter: Payer: BLUE CROSS/BLUE SHIELD | Admitting: Physical Therapy

## 2018-02-17 ENCOUNTER — Encounter: Payer: BLUE CROSS/BLUE SHIELD | Admitting: Physical Therapy

## 2018-02-21 ENCOUNTER — Emergency Department (HOSPITAL_COMMUNITY): Payer: BLUE CROSS/BLUE SHIELD

## 2018-02-21 ENCOUNTER — Encounter: Payer: BLUE CROSS/BLUE SHIELD | Admitting: Physical Therapy

## 2018-02-21 ENCOUNTER — Other Ambulatory Visit: Payer: Self-pay

## 2018-02-21 ENCOUNTER — Inpatient Hospital Stay (HOSPITAL_COMMUNITY)
Admission: EM | Admit: 2018-02-21 | Discharge: 2018-02-23 | DRG: 988 | Disposition: A | Discharge status: Home / Self Care | Payer: BLUE CROSS/BLUE SHIELD | Attending: Family Medicine | Admitting: Family Medicine

## 2018-02-21 ENCOUNTER — Encounter (HOSPITAL_COMMUNITY): Payer: Self-pay | Admitting: Emergency Medicine

## 2018-02-21 DIAGNOSIS — Z823 Family history of stroke: Secondary | ICD-10-CM

## 2018-02-21 DIAGNOSIS — E1140 Type 2 diabetes mellitus with diabetic neuropathy, unspecified: Secondary | ICD-10-CM | POA: Diagnosis not present

## 2018-02-21 DIAGNOSIS — M869 Osteomyelitis, unspecified: Secondary | ICD-10-CM | POA: Diagnosis not present

## 2018-02-21 DIAGNOSIS — Z981 Arthrodesis status: Secondary | ICD-10-CM

## 2018-02-21 DIAGNOSIS — Z8614 Personal history of Methicillin resistant Staphylococcus aureus infection: Secondary | ICD-10-CM

## 2018-02-21 DIAGNOSIS — Z96651 Presence of right artificial knee joint: Secondary | ICD-10-CM | POA: Diagnosis present

## 2018-02-21 DIAGNOSIS — E1165 Type 2 diabetes mellitus with hyperglycemia: Secondary | ICD-10-CM

## 2018-02-21 DIAGNOSIS — G4733 Obstructive sleep apnea (adult) (pediatric): Secondary | ICD-10-CM | POA: Diagnosis present

## 2018-02-21 DIAGNOSIS — Z7984 Long term (current) use of oral hypoglycemic drugs: Secondary | ICD-10-CM

## 2018-02-21 DIAGNOSIS — F32A Depression, unspecified: Secondary | ICD-10-CM

## 2018-02-21 DIAGNOSIS — L03011 Cellulitis of right finger: Secondary | ICD-10-CM | POA: Diagnosis present

## 2018-02-21 DIAGNOSIS — Z89512 Acquired absence of left leg below knee: Secondary | ICD-10-CM

## 2018-02-21 DIAGNOSIS — A4159 Other Gram-negative sepsis: Secondary | ICD-10-CM

## 2018-02-21 DIAGNOSIS — IMO0002 Reserved for concepts with insufficient information to code with codable children: Secondary | ICD-10-CM | POA: Diagnosis present

## 2018-02-21 DIAGNOSIS — F329 Major depressive disorder, single episode, unspecified: Secondary | ICD-10-CM | POA: Diagnosis present

## 2018-02-21 DIAGNOSIS — E11610 Type 2 diabetes mellitus with diabetic neuropathic arthropathy: Secondary | ICD-10-CM | POA: Diagnosis present

## 2018-02-21 DIAGNOSIS — D649 Anemia, unspecified: Secondary | ICD-10-CM | POA: Diagnosis present

## 2018-02-21 DIAGNOSIS — Z881 Allergy status to other antibiotic agents status: Secondary | ICD-10-CM

## 2018-02-21 DIAGNOSIS — E1169 Type 2 diabetes mellitus with other specified complication: Secondary | ICD-10-CM | POA: Diagnosis not present

## 2018-02-21 DIAGNOSIS — Z23 Encounter for immunization: Secondary | ICD-10-CM

## 2018-02-21 DIAGNOSIS — Z96653 Presence of artificial knee joint, bilateral: Secondary | ICD-10-CM | POA: Diagnosis present

## 2018-02-21 HISTORY — DX: Depression, unspecified: F32.A

## 2018-02-21 LAB — CBC WITH DIFFERENTIAL/PLATELET
ABS IMMATURE GRANULOCYTES: 0.08 10*3/uL — AB (ref 0.00–0.07)
BASOS ABS: 0 10*3/uL (ref 0.0–0.1)
BASOS PCT: 0 %
Eosinophils Absolute: 0.4 10*3/uL (ref 0.0–0.5)
Eosinophils Relative: 4 %
HCT: 34.4 % — ABNORMAL LOW (ref 36.0–46.0)
Hemoglobin: 10.6 g/dL — ABNORMAL LOW (ref 12.0–15.0)
IMMATURE GRANULOCYTES: 1 %
Lymphocytes Relative: 17 %
Lymphs Abs: 1.6 10*3/uL (ref 0.7–4.0)
MCH: 28.1 pg (ref 26.0–34.0)
MCHC: 30.8 g/dL (ref 30.0–36.0)
MCV: 91.2 fL (ref 80.0–100.0)
MONOS PCT: 6 %
Monocytes Absolute: 0.6 10*3/uL (ref 0.1–1.0)
NEUTROS ABS: 6.6 10*3/uL (ref 1.7–7.7)
NEUTROS PCT: 72 %
NRBC: 0 % (ref 0.0–0.2)
PLATELETS: 360 10*3/uL (ref 150–400)
RBC: 3.77 MIL/uL — AB (ref 3.87–5.11)
RDW: 14.4 % (ref 11.5–15.5)
WBC: 9.3 10*3/uL (ref 4.0–10.5)

## 2018-02-21 LAB — HCG, QUANTITATIVE, PREGNANCY: HCG, BETA CHAIN, QUANT, S: 1 m[IU]/mL (ref <5)

## 2018-02-21 LAB — APTT: APTT: 28 s (ref 24–36)

## 2018-02-21 LAB — COMPREHENSIVE METABOLIC PANEL
ALT: 14 U/L (ref 0–44)
AST: 13 U/L — AB (ref 15–41)
Albumin: 3.7 g/dL (ref 3.5–5.0)
Alkaline Phosphatase: 81 U/L (ref 38–126)
Anion gap: 9 (ref 5–15)
BILIRUBIN TOTAL: 0.7 mg/dL (ref 0.3–1.2)
BUN: 20 mg/dL (ref 6–20)
CALCIUM: 9.5 mg/dL (ref 8.9–10.3)
CO2: 26 mmol/L (ref 22–32)
Chloride: 104 mmol/L (ref 98–111)
Creatinine, Ser: 0.9 mg/dL (ref 0.44–1.00)
Glucose, Bld: 108 mg/dL — ABNORMAL HIGH (ref 70–99)
POTASSIUM: 4.2 mmol/L (ref 3.5–5.1)
Sodium: 139 mmol/L (ref 135–145)
TOTAL PROTEIN: 8 g/dL (ref 6.5–8.1)

## 2018-02-21 LAB — PROTIME-INR
INR: 0.98
Prothrombin Time: 12.9 seconds (ref 11.4–15.2)

## 2018-02-21 LAB — GLUCOSE, CAPILLARY: Glucose-Capillary: 156 mg/dL — ABNORMAL HIGH (ref 70–99)

## 2018-02-21 LAB — I-STAT CG4 LACTIC ACID, ED
LACTIC ACID, VENOUS: 1.62 mmol/L (ref 0.5–1.9)
Lactic Acid, Venous: 1.36 mmol/L (ref 0.5–1.9)

## 2018-02-21 LAB — C-REACTIVE PROTEIN: CRP: 6.8 mg/dL — AB (ref <1.0)

## 2018-02-21 LAB — BRAIN NATRIURETIC PEPTIDE: B NATRIURETIC PEPTIDE 5: 8.7 pg/mL (ref 0.0–100.0)

## 2018-02-21 LAB — SEDIMENTATION RATE: Sed Rate: 71 mm/hr — ABNORMAL HIGH (ref 0–22)

## 2018-02-21 MED ORDER — HYDROCODONE-ACETAMINOPHEN 10-325 MG PO TABS
1.0000 | ORAL_TABLET | Freq: Four times a day (QID) | ORAL | Status: DC | PRN
  Administered 2018-02-22 – 2018-02-23 (×3): 1 via ORAL
  Filled 2018-02-21 (×3): qty 1

## 2018-02-21 MED ORDER — TETANUS-DIPHTH-ACELL PERTUSSIS 5-2.5-18.5 LF-MCG/0.5 IM SUSP
0.5000 mL | Freq: Once | INTRAMUSCULAR | Status: AC
  Administered 2018-02-21: 0.5 mL via INTRAMUSCULAR
  Filled 2018-02-21: qty 0.5

## 2018-02-21 MED ORDER — POLYETHYLENE GLYCOL 3350 17 G PO PACK: 17.0000 g | PACK | Freq: Every day | ORAL | Status: DC | PRN

## 2018-02-21 MED ORDER — ONDANSETRON HCL 4 MG/2ML IJ SOLN: 4.0000 mg | Freq: Four times a day (QID) | INTRAMUSCULAR | Status: DC | PRN

## 2018-02-21 MED ORDER — SODIUM CHLORIDE 0.9 % IV SOLN
INTRAVENOUS | Status: DC
  Administered 2018-02-21: 22:00:00 via INTRAVENOUS

## 2018-02-21 MED ORDER — VANCOMYCIN HCL IN DEXTROSE 1-5 GM/200ML-% IV SOLN
1000.0000 mg | Freq: Two times a day (BID) | INTRAVENOUS | Status: DC
  Administered 2018-02-22 – 2018-02-23 (×3): 1000 mg via INTRAVENOUS
  Filled 2018-02-21 (×3): qty 200

## 2018-02-21 MED ORDER — SODIUM CHLORIDE 0.9 % IV BOLUS
1000.0000 mL | Freq: Once | INTRAVENOUS | Status: AC
  Administered 2018-02-21: 1000 mL via INTRAVENOUS

## 2018-02-21 MED ORDER — ZOLPIDEM TARTRATE 5 MG PO TABS: 5.0000 mg | ORAL_TABLET | Freq: Every evening | ORAL | Status: DC | PRN

## 2018-02-21 MED ORDER — INSULIN ASPART 100 UNIT/ML ~~LOC~~ SOLN: 0.0000 [IU] | Freq: Three times a day (TID) | SUBCUTANEOUS | Status: DC

## 2018-02-21 MED ORDER — LINEZOLID 600 MG/300ML IV SOLN
600.0000 mg | Freq: Two times a day (BID) | INTRAVENOUS | Status: DC
  Filled 2018-02-21: qty 300

## 2018-02-21 MED ORDER — GLUCOSAMINE-CHONDROITIN 500-400 MG PO TABS: 2.0000 | ORAL_TABLET | Freq: Every day | ORAL | Status: DC

## 2018-02-21 MED ORDER — DICLOFENAC SODIUM 75 MG PO TBEC
75.0000 mg | DELAYED_RELEASE_TABLET | Freq: Two times a day (BID) | ORAL | Status: DC
  Administered 2018-02-21 – 2018-02-22 (×3): 75 mg via ORAL
  Filled 2018-02-21 (×4): qty 1

## 2018-02-21 MED ORDER — ADULT MULTIVITAMIN W/MINERALS CH
1.0000 | ORAL_TABLET | Freq: Every day | ORAL | Status: DC
  Administered 2018-02-22 – 2018-02-23 (×2): 1 via ORAL
  Filled 2018-02-21 (×2): qty 1

## 2018-02-21 MED ORDER — SODIUM CHLORIDE 0.9 % IV BOLUS: 2000.0000 mL | Freq: Once | INTRAVENOUS | Status: DC

## 2018-02-21 MED ORDER — ONDANSETRON HCL 4 MG PO TABS: 4.0000 mg | ORAL_TABLET | Freq: Four times a day (QID) | ORAL | Status: DC | PRN

## 2018-02-21 MED ORDER — INSULIN ASPART 100 UNIT/ML ~~LOC~~ SOLN: 0.0000 [IU] | Freq: Every day | SUBCUTANEOUS | Status: DC

## 2018-02-21 MED ORDER — ACETAMINOPHEN 325 MG PO TABS: 650.0000 mg | ORAL_TABLET | Freq: Four times a day (QID) | ORAL | Status: DC | PRN

## 2018-02-21 MED ORDER — VANCOMYCIN HCL 10 G IV SOLR
2000.0000 mg | Freq: Once | INTRAVENOUS | Status: AC
  Administered 2018-02-21: 2000 mg via INTRAVENOUS
  Filled 2018-02-21: qty 2000

## 2018-02-21 MED ORDER — OXYBUTYNIN CHLORIDE ER 5 MG PO TB24
10.0000 mg | ORAL_TABLET | Freq: Every day | ORAL | Status: DC
  Filled 2018-02-21: qty 1
  Filled 2018-02-21: qty 2

## 2018-02-21 MED ORDER — VITAMIN B-12 1000 MCG PO TABS
1000.0000 ug | ORAL_TABLET | Freq: Every day | ORAL | Status: DC
  Administered 2018-02-22 – 2018-02-23 (×2): 1000 ug via ORAL
  Filled 2018-02-21 (×3): qty 1

## 2018-02-21 MED ORDER — SODIUM CHLORIDE 0.9 % IV SOLN
2.0000 g | Freq: Once | INTRAVENOUS | Status: AC
  Administered 2018-02-21: 2 g via INTRAVENOUS
  Filled 2018-02-21: qty 2

## 2018-02-21 MED ORDER — DULOXETINE HCL 60 MG PO CPEP
60.0000 mg | ORAL_CAPSULE | Freq: Every day | ORAL | Status: DC
  Administered 2018-02-22 – 2018-02-23 (×2): 60 mg via ORAL
  Filled 2018-02-21 (×2): qty 1

## 2018-02-21 MED ORDER — HYDRALAZINE HCL 20 MG/ML IJ SOLN: 5.0000 mg | INTRAMUSCULAR | Status: DC | PRN

## 2018-02-21 MED ORDER — ACETAMINOPHEN 650 MG RE SUPP: 650.0000 mg | Freq: Four times a day (QID) | RECTAL | Status: DC | PRN

## 2018-02-21 NOTE — Progress Notes (Signed)
Briefly discussed patient with ER PA; pt will need amputation of finger tip.  Full consult to follow.  Will plan surgery in am.

## 2018-02-21 NOTE — ED Provider Notes (Signed)
[facility]-EMERGENCY DEPT Provider Note   CSN: [id number] Arrival date & time: 02/21/18  1255     History   Chief Complaint Chief Complaint  Patient presents with  . Finger Injury    HPI Bernadette Gores Kempe is a 52 y.o. female.  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 Janee Morn), status post left BKA (per [facility]) presenting for 10-day history of progressive swelling and purulent drainage from the right index finger.  Patient reports that it began insidiously, and she does not know the initial inciting incident where she pricked her finger.  She does note that she was caring for a horse with "rain rot" without gloves around the time of the injury.  Patient reports that she has tried soaking and taking Keflex, but noticed progressive swelling and streaking up her right arm.  She reports that it began leaking purulent fluid, and 3 days ago, she soaked in water that she realized was too warm for the finger, and tissue on the end of her finger "sloughed off".  Patient denies fever, chills, chest pain, shortness of breath, abdominal pain, nausea, or vomiting.  She does report that she has decreased appetite, which is consistent with prior infection.  Past Medical History:  Diagnosis Date  . Arthritis   . Diabetes mellitus without complication ([facility])   . DOE (dyspnea on exertion) 07/12/2017  . FUO (fever of unknown origin) 07/12/2017  . Gastroenteritis 04/2016  . Headache(784.0)    hormonal headache  . Low back pain 07/12/2017  . Neuropathy    bilateral feet  . Obese   . Pleuritic chest pain 07/12/2017  . Pneumonia    hx 20+ years ago  . Sleep apnea    bipap 1 yr  . Ulcer of foot ([facility])    toe ulcer greater than a year ago    Patient Active  Problem List   Diagnosis Date Noted  . Osteomyelitis, lower leg ([facility]) 10/19/2017  . MSSA bacteremia 10/19/2017  . Left ankle pain 07/27/2017  . Osteoarthritis 07/27/2017  . Septic shock ([facility]) 07/13/2017  . Shortness of breath 07/13/2017  . Bacteremia due to Klebsiella pneumoniae 07/13/2017  . Allergic reaction to drug 07/13/2017  . Uncontrolled type 2 diabetes mellitus with hyperglycemia ([facility]) 07/12/2017  . Idiopathic peripheral neuropathy   . Osteomyelitis of right hand ([facility])   . 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 MRSA infection   . 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 Controlled type 2 diabetes with neuropathy ([facility]) 05/04/2016  . Open wound of plantar aspect of foot 01/13/2016  . Morbid (severe) obesity due to excess calories ([facility]) 01/17/2015  . Deformity of ankle and foot, acquired 12/06/2014  . Charcot's joint of foot, left 08/03/2014  . Hypersomnia 12/20/2013  . Obstructive sleep apnea syndrome, severe 12/20/2013  . Other fatigue 12/20/2013  . Snoring 12/20/2013  . 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 Postoperative anemia due to acute blood loss 06/25/2011  . Osteoarthritis of knee 06/23/2011  . Obesity, morbid (more than 100 lbs over ideal weight or BMI > 40) ([facility]) 06/23/2011    Past Surgical History:  Procedure Laterality Date  . AMPUTATION Right 06/11/2017   Procedure: RIGHT LONG FINGER  AMPUTATION DIGIT;  Surgeon: Mack Hook, MD;  [facility];  Service: Orthopedics;  Laterality: Right;  . ANKLE FUSION Left 08/03/2014   Procedure: Left Tibiocalcaneal Fusion;  Surgeon: Nadara Mustard, MD;  Location: [facility] OR;  Service: Orthopedics;  Laterality: Left;  . APPLICATION OF WOUND VAC Left 08/03/2014   Procedure: APPLICATION OF WOUND VAC;  Surgeon: Nadara Mustard, MD;  Location: [facility] OR;  Service: Orthopedics;  Laterality: Left;  . I&D KNEE WITH POLY EXCHANGE  Right 01/31/2013   Procedure: IRRIGATION AND DEBRIDEMENT KNEE WITH POLY EXCHANGE possible Antibiotic Spacer;  Surgeon: Valeria Batman, MD;  Location: [facility] OR;  Service: Orthopedics;  Laterality: Right;  . INCISION AND DRAINAGE Right 01/31/2013   ANTIBIOTIC SPACER  RIGHT KNEE  DR Cleophas Dunker   . KNEE ARTHROSCOPY     left knee  . ORIF ANKLE FRACTURE Left 03/16/2014   Procedure: Foot Excision Charcot Collapse,  Internal Fixation;  Surgeon: Nadara Mustard, MD;  Location: [facility] OR;  Service: Orthopedics;  Laterality: Left;  . REPLACEMENT TOTAL KNEE     left  . TOTAL KNEE ARTHROPLASTY  06/23/2011   Procedure: TOTAL KNEE ARTHROPLASTY;  Surgeon: Valeria Batman, MD;  Location: [facility] OR;  Service: Orthopedics;  Laterality: Right;  RIGHT TOTAL KNEE REPLACEMENT      OB History   None      Home Medications    Prior to Admission medications   Medication Sig Start Date End Date Taking? Authorizing Provider  Cyanocobalamin (B-12) 1000 MCG TABS Take 1,000 mcg by mouth daily.    [provider]  diclofenac (VOLTAREN) 75 MG EC tablet Take 75 mg by mouth 2 (two) times daily.  02/28/14   [provider]  DULoxetine (CYMBALTA) 30 MG capsule Take 90 mg by mouth daily.     [provider]  Exenatide ER 2 MG PEN Inject 2 mg into the skin every Tuesday.  02/06/16   [provider]  glipiZIDE (GLUCOTROL XL) 10 MG 24 hr tablet Take 10 mg by mouth daily with breakfast.  05/23/17   [provider]  glucosamine-chondroitin 500-400 MG tablet Take 2 tablets by mouth daily.     [provider]  HYDROcodone-acetaminophen (NORCO) 10-325 MG tablet Take 1 tablet by mouth every 6 (six) hours as needed for moderate pain.  07/02/16   [provider]  mirabegron ER (MYRBETRIQ) 50 MG TB24 tablet Take 50 mg by mouth every morning. 01/13/16   [provider]  Multiple Vitamin (MULTIVITAMIN WITH MINERALS) TABS Take 1 tablet by mouth daily.    [provider]    mupirocin ointment (BACTROBAN) 2 % Apply 1 application to the affected fingertip on left hand one to three times a day    [provider]  ondansetron (ZOFRAN-ODT) 8 MG disintegrating tablet Take 8 mg by mouth every 8 (eight) hours as needed for nausea/vomiting. DISSOLVE IN THE MOUTH 04/30/16   [provider]  oxyCODONE (OXY IR/ROXICODONE) 5 MG immediate release tablet Take 1-2 tablets (5-10 mg total) by mouth every 6 (six) hours as needed for severe pain or breakthrough pain. 06/12/17   Mack Hook, MD  SitaGLIPtin-MetFORMIN HCl (JANUMET XR) 50-1000 MG TB24 Take 1 tablet by mouth at bedtime. 11/21/15   [provider]    Family History Family History  Problem Relation Age of Onset  . Anesthesia problems Father   . Stroke Father   . Hypotension Neg Hx   . Malignant hyperthermia Neg Hx   .  Pseudochol deficiency Neg Hx     Social History Social History   Tobacco Use  . Smoking status: Never Smoker  . Smokeless tobacco: Never Used  Substance Use Topics  . Alcohol use: Yes    Alcohol/week: 0.0 standard drinks    Comment: occ  . Drug use: No     Allergies   Rocephin [ceftriaxone]; Daptomycin; and Vancomycin   Review of Systems Review of Systems  Constitutional: Positive for appetite change. Negative for chills and fever.  Respiratory: Negative for shortness of breath.   Cardiovascular: Negative for chest pain.  Gastrointestinal: Negative for abdominal pain, nausea and vomiting.  Skin: Positive for color change and wound.  Neurological: Positive for numbness. Negative for weakness.  All other systems reviewed and are negative.    Physical Exam Updated Vital Signs BP (!) 156/85 (BP Location: Left Arm)   Pulse (!) 101   Temp 98.8 F (37.1 C) (Oral)   Resp 18   SpO2 99%   Physical Exam  Constitutional: She appears well-developed and well-nourished. No distress.  Nontoxic-appearing.  HENT:  Head: Normocephalic and atraumatic.   Mouth/Throat: Oropharynx is clear and moist.  Eyes: Pupils are equal, round, and reactive to light. Conjunctivae and EOM are normal.  Neck: Normal range of motion. Neck supple.  Cardiovascular: Normal rate, regular rhythm, S1 normal and S2 normal.  No murmur heard. Not tachycardic on my examination.  Pulmonary/Chest: Effort normal and breath sounds normal. She has no wheezes. She has no rales.  Abdominal: Soft. She exhibits no distension. There is no tenderness. There is no guarding.  Musculoskeletal: Normal range of motion. She exhibits edema and deformity.  See clinical photo for details.  Patient has significant swelling of the right digit, as well as erythema beginning to streak up the right hand.  The distal right second phalanx exhibits dehiscence, and purulent drainage.  Neurological: She is alert.  Cranial nerves grossly intact. Patient moves extremities symmetrically and with good coordination.  Skin: Skin is warm and dry. No rash noted. No erythema.  Psychiatric: She has a normal mood and affect. Her behavior is normal. Judgment and thought content normal.  Nursing note and vitals reviewed.        ED Treatments / Results  Labs (all labs ordered are listed, but only abnormal results are displayed) Labs Reviewed  COMPREHENSIVE METABOLIC PANEL - Abnormal; Notable for the following components:      Result Value   Glucose, Bld 108 (*)    AST 13 (*)    All other components within normal limits  CBC WITH DIFFERENTIAL/PLATELET - Abnormal; Notable for the following components:   RBC 3.77 (*)    Hemoglobin 10.6 (*)    HCT 34.4 (*)    Abs Immature Granulocytes 0.08 (*)    All other components within normal limits  SEDIMENTATION RATE - Abnormal; Notable for the following components:   Sed Rate 71 (*)    All other components within normal limits  C-REACTIVE PROTEIN - Abnormal; Notable for the following components:   CRP 6.8 (*)    All other components within normal limits   GLUCOSE, CAPILLARY - Abnormal; Notable for the following components:   Glucose-Capillary 156 (*)    All other components within normal limits  CULTURE, BLOOD (ROUTINE X 2)  CULTURE, BLOOD (ROUTINE X 2)  AEROBIC CULTURE (SUPERFICIAL SPECIMEN)  HCG, QUANTITATIVE, PREGNANCY  PROTIME-INR  APTT  BRAIN NATRIURETIC PEPTIDE  BASIC METABOLIC PANEL  CBC  I-STAT CG4 LACTIC ACID, ED  I-STAT CG4 LACTIC ACID, ED    EKG None  Radiology No results found.  Procedures Procedures (including critical care time)  Medications Ordered in ED Medications  Tdap (BOOSTRIX) injection 0.5 mL (has no administration in time range)     Initial Impression / Assessment and Plan / ED Course  I have reviewed the triage vital signs and the nursing notes.  Pertinent labs & imaging results that were available during my care of the patient were reviewed by me and considered in my medical decision making (see chart for details).  Clinical Course as of Feb 22 1847  Mon Feb 21, 2018  1737 Declines chance of pregnancy and declines pregnancy testing.   [AM]  1759 Spoke with ED pharmacist Alvino Chapel regarding antibiotic selection.   [AM]  1837 Chip Boer, PA-C call back and will get in touch with Dr. Janee Morn to give right of first refusal for surgical management.   [AM]  1845 Stable and improved.   Hemoglobin(!): 10.6 [AM]  1847 Dr. Janee Morn of [facility] to assume surgical care tomorrow.  Appreciate this team's involvement in the care of this patient.   [AM]    Clinical Course User Index [AM] Elisha Ponder, PA-C    Patient is nontoxic-appearing, afebrile, and in no acute distress at this time.  Patient with wound with significant dehiscence, and radiographic evidence of osteomyelitis.  Given the streaking up the arm and history of sepsis secondary to similar infections, patient started on broad-spectrum antibiotics per consultation with ED pharmacist.  No leukocytosis.  Patient has  stable anemia at 10.6, that is improved.  Pt not meeting SIRS/sepsis criteria at this time. Cultures pending.  Will admit to medicine with surgical consultation in AM.   Final Clinical Impressions(s) / ED Diagnoses   Final diagnoses:  Osteomyelitis of right hand, unspecified type [facility])    ED Discharge Orders    None       Elisha Ponder, PA-C 02/22/18 0021    Little, Ambrose Finland, MD 02/22/18 1549

## 2018-02-21 NOTE — Progress Notes (Signed)
PHARMACIST - PHYSICIAN ORDER COMMUNICATION ° °CONCERNING: P&T Medication Policy on Herbal Medications ° °DESCRIPTION:  This patient’s order for:  Glucosamine-Chondroitin  has been noted. ° °This product(s) is classified as an “herbal” or natural product. °Due to a lack of definitive safety studies or FDA approval, nonstandard manufacturing practices, plus the potential risk of unknown drug-drug interactions while on inpatient medications, the Pharmacy and Therapeutics Committee does not permit the use of “herbal” or natural products of this type within . °  °ACTION TAKEN: °The pharmacy department is unable to verify this order at this time and your patient has been informed of this safety policy. °Please reevaluate patient’s clinical condition at discharge and address if the herbal or natural product(s) should be resumed at that time.  ° °Daleyssa Loiselle, PharmD °

## 2018-02-21 NOTE — ED Triage Notes (Signed)
Pt complaint of right index finger pain, signs of infection. With home treatment "end sloth off" when heat applied; hx of neuropathy. "Streaking up hand."

## 2018-02-21 NOTE — H&P (Signed)
History and Physical    [name] XIP:[id number] DOB: 28-Jul-1965 DOA: 02/21/2018  Referring MD/NP/PA:   PCP: Chesley Noon, MD   Patient coming from:  The patient is coming from home.  At baseline, pt is independent for most of ADL.   Chief Complaint: right index finger infection  HPI: [name] is a 52 y.o. 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 Grandville Silos), s/p of left BKA (per [facility]), who presents with right index finger infection.  Pt states that she has been having right index finger tip infection with swelling and purulent drainage for more than 10 days. Patient reports that it began insidiously. Due to peripheral neuropathy, she does not feel pain. Patient reports that she has tried soaking and been taking Keflex in the past 8 days without improvement. She states that she soaked in water, and realized was too warm for the finger, and tissue on the tip of her finger "sloughed off".  Patient denies fever, chills.  No chest pain, shortness of breath, cough.  Denies nausea vomiting, diarrhea, abdominal pain, symptoms of UTI.  No unilateral weakness.  ED Course: pt was found to have WBC 9.3, electrolytes renal function okay, lactic acid 1.62, temperature normal, tachycardia, no tachypnea, oxygen saturation 99% on room air.  X-ray of her right index finger showed signs of osteomyelitis.  Patient is placed on [facility] for observation.  Dr. Lenon Curt of ortho was consulted.   Review of Systems:   General: no fevers, chills, no body weight gain, has fatigue HEENT: no blurry vision, hearing changes or sore throat Respiratory: no dyspnea, coughing, wheezing CV: no chest pain, no palpitations GI: no nausea, vomiting,  abdominal pain, diarrhea, constipation GU: no dysuria, burning on urination, increased urinary frequency, hematuria  Ext: no leg edema Neuro: no unilateral weakness, numbness, or tingling, no vision change or hearing loss Skin: has swelling and purulent drainage in the right index finger tip. MSK: No muscle spasm, no deformity, no limitation of range of movement in spin Heme: No easy bruising.  Travel history: No recent long distant travel.  Allergy:  Allergies  Allergen Reactions  . Rocephin [Ceftriaxone] Shortness Of Breath and Swelling    Tolerated course of Rocephin 6/19 and multiple courses of Ancef/Keflex as well as Cefepime  . Daptomycin Other (See Comments)    Chills and lower back pain   . Vancomycin Other (See Comments)    Drives creatinine level(s) "through the roof" and they didn't return to normal for approx 14 days Reaction happened in march, has since had vancomycin without problem    Past Medical History:  Diagnosis Date  . Arthritis   . Diabetes mellitus without complication ([place])   . DOE (dyspnea on exertion) 07/12/2017  . FUO (fever of unknown origin) 07/12/2017  . Gastroenteritis 04/2016  . Headache(784.0)    hormonal headache  . Low back pain 07/12/2017  . Neuropathy    bilateral feet  . Obese   . Pleuritic chest pain 07/12/2017  . Pneumonia    hx 20+ years ago  . Sleep apnea    bipap 1 yr  . Ulcer of foot ([place])    toe ulcer greater than a year ago    Past Surgical History:  Procedure Laterality Date  . AMPUTATION  Right 06/11/2017   Procedure: RIGHT LONG FINGER AMPUTATION DIGIT;  Surgeon: Milly Jakob, MD;  Location: [place];  Service: Orthopedics;  Laterality: Right;  . ANKLE FUSION Left 08/03/2014   Procedure: Left Tibiocalcaneal Fusion;  Surgeon: Newt Minion, MD;  Location: [place];  Service: Orthopedics;  Laterality: Left;  . APPLICATION OF WOUND VAC Left 08/03/2014   Procedure: APPLICATION OF WOUND VAC;  Surgeon: Newt Minion, MD;  Location: [place];   Service: Orthopedics;  Laterality: Left;  . I&D KNEE WITH POLY EXCHANGE Right 01/31/2013   Procedure: IRRIGATION AND DEBRIDEMENT KNEE WITH POLY EXCHANGE possible Antibiotic Spacer;  Surgeon: Garald Balding, MD;  Location: [place];  Service: Orthopedics;  Laterality: Right;  . INCISION AND DRAINAGE Right 01/31/2013   ANTIBIOTIC SPACER  RIGHT KNEE  DR Durward Fortes   . KNEE ARTHROSCOPY     left knee  . ORIF ANKLE FRACTURE Left 03/16/2014   Procedure: Foot Excision Charcot Collapse,  Internal Fixation;  Surgeon: Newt Minion, MD;  Location: [place];  Service: Orthopedics;  Laterality: Left;  . REPLACEMENT TOTAL KNEE     left  . TOTAL KNEE ARTHROPLASTY  06/23/2011   Procedure: TOTAL KNEE ARTHROPLASTY;  Surgeon: Garald Balding, MD;  Location: [place];  Service: Orthopedics;  Laterality: Right;  RIGHT TOTAL KNEE REPLACEMENT     Social History:  reports that she has never smoked. She has never used smokeless tobacco. She reports that she drinks alcohol. She reports that she does not use drugs.  Family History:  Family History  Problem Relation Age of Onset  . Anesthesia problems Father   . Stroke Father   . Hypotension Neg Hx   . Malignant hyperthermia Neg Hx   . Pseudochol deficiency Neg Hx      Prior to Admission medications   Medication Sig Start Date End Date Taking? Authorizing Provider  cephALEXin (KEFLEX) 500 MG capsule Take 500 mg by mouth 2 (two) times daily.   Yes [provider]  Cyanocobalamin (B-12) 1000 MCG TABS Take 1,000 mcg by mouth daily.   Yes [provider]  diclofenac (VOLTAREN) 75 MG EC tablet Take 75 mg by mouth 2 (two) times daily.  02/28/14  Yes [provider]  DULoxetine (CYMBALTA) 30 MG capsule Take 60 mg by mouth daily.    Yes [provider]  Exenatide ER 2 MG PEN Inject 2 mg into the skin every Tuesday.  02/06/16  Yes [provider]  glipiZIDE (GLUCOTROL XL) 10 MG 24 hr tablet Take 10 mg by mouth daily with breakfast.   05/23/17  Yes [provider]  glucosamine-chondroitin 500-400 MG tablet Take 2 tablets by mouth daily.    Yes [provider]  HYDROcodone-acetaminophen (NORCO) 10-325 MG tablet Take 1 tablet by mouth every 6 (six) hours as needed for moderate pain.  07/02/16  Yes [provider]  Multiple Vitamin (MULTIVITAMIN WITH MINERALS) TABS Take 1 tablet by mouth daily.   Yes [provider]  ondansetron (ZOFRAN-ODT) 8 MG disintegrating tablet Take 8 mg by mouth every 8 (eight) hours as needed for nausea/vomiting. DISSOLVE IN THE MOUTH 04/30/16  Yes [provider]  oxybutynin (DITROPAN-XL) 10 MG 24 hr tablet Take 10 mg by mouth at bedtime.   Yes [provider]  SitaGLIPtin-MetFORMIN HCl (JANUMET XR) 50-1000 MG TB24 Take 1 tablet by mouth at bedtime. 11/21/15  Yes [provider]  oxyCODONE (OXY IR/ROXICODONE) 5 MG immediate release tablet Take 1-2 tablets (5-10 mg  total) by mouth every 6 (six) hours as needed for severe pain or breakthrough pain. Patient not taking: Reported on 02/21/2018 06/12/17   Milly Jakob, MD    Physical Exam: Vitals:   02/21/18 1302  BP: (!) 156/85  Pulse: (!) 101  Resp: 18  Temp: 98.8 F (37.1 C)  TempSrc: Oral  SpO2: 99%   General: Not in acute distress HEENT:       Eyes: PERRL, EOMI, no scleral icterus.       ENT: No discharge from the ears and nose, no pharynx injection, no tonsillar enlargement.        Neck: No JVD, no bruit, no mass felt. Heme: No neck lymph node enlargement. Cardiac: S1/S2, RRR, No murmurs, No gallops or rubs. Respiratory:  No rales, wheezing, rhonchi or rubs. GI: Soft, nondistended, nontender, no rebound pain, no organomegaly, BS present. GU: No hematuria Ext: No pitting leg edema bilaterally. 1+DP/PT in left leg.  Musculoskeletal: No joint deformities, No joint redness or warmth, no limitation of ROM in spin. Skin: has swelling and purulent drainage in the right index finger tip.  Also has erythema, beginning to streak up the right hand.   Neuro: Alert, oriented X3, cranial nerves II-XII grossly intact, moves all extremities normally Psych: Patient is not psychotic, no suicidal or hemocidal ideation.  Labs on Admission: I have personally reviewed following labs and imaging studies  CBC: Recent Labs  Lab 02/21/18 1407  WBC 9.3  NEUTROABS 6.6  HGB 10.6*  HCT 34.4*  MCV 91.2  PLT 009   Basic Metabolic Panel: Recent Labs  Lab 02/21/18 1407  NA 139  K 4.2  CL 104  CO2 26  GLUCOSE 108*  BUN 20  CREATININE 0.90  CALCIUM 9.5   GFR: CrCl cannot be calculated (Unknown ideal weight.). Liver Function Tests: Recent Labs  Lab 02/21/18 1407  AST 13*  ALT 14  ALKPHOS 81  BILITOT 0.7  PROT 8.0  ALBUMIN 3.7   No results for input(s): LIPASE, AMYLASE in the last 168 hours. No results for input(s): AMMONIA in the last 168 hours. Coagulation Profile: Recent Labs  Lab 02/21/18 2032  INR 0.98   Cardiac Enzymes: No results for input(s): CKTOTAL, CKMB, CKMBINDEX, TROPONINI in the last 168 hours. BNP (last 3 results) No results for input(s): PROBNP in the last 8760 hours. HbA1C: No results for input(s): HGBA1C in the last 72 hours. CBG: No results for input(s): GLUCAP in the last 168 hours. Lipid Profile: No results for input(s): CHOL, HDL, LDLCALC, TRIG, CHOLHDL, LDLDIRECT in the last 72 hours. Thyroid Function Tests: No results for input(s): TSH, T4TOTAL, FREET4, T3FREE, THYROIDAB in the last 72 hours. Anemia Panel: No results for input(s): VITAMINB12, FOLATE, FERRITIN, TIBC, IRON, RETICCTPCT in the last 72 hours. Urine analysis:    Component Value Date/Time   COLORURINE YELLOW 07/12/2017 1623   APPEARANCEUR CLEAR 07/12/2017 1623   LABSPEC 1.023 07/12/2017 1623   PHURINE 6.0 07/12/2017 1623   GLUCOSEU >=500 (A) 07/12/2017 1623   HGBUR SMALL (A) 07/12/2017 1623   BILIRUBINUR NEGATIVE 07/12/2017 1623   KETONESUR NEGATIVE 07/12/2017 1623    PROTEINUR 30 (A) 07/12/2017 1623   UROBILINOGEN 0.2 03/23/2014 1211   NITRITE NEGATIVE 07/12/2017 1623   LEUKOCYTESUR NEGATIVE 07/12/2017 1623   Sepsis Labs: @LABRCNTIP (procalcitonin:4,lacticidven:4) )No results found for this or any previous visit (from the past 240 hour(s)).   Radiological Exams on Admission: Dg Finger Index Right  Result Date: 02/21/2018 CLINICAL DATA:  RIGHT index finger pain and  swelling. Laceration 10 days ago. EXAM: RIGHT INDEX FINGER 2+V COMPARISON:  None. FINDINGS: The visualized tuft is displaced from the remainder of the distal phalanx by at least 1 cm. Soft tissue swelling throughout this region noted and findings are suspicious for osteomyelitis. No subluxation or dislocation. No radiopaque foreign bodies are noted. IMPRESSION: Displaced tuft from the remainder of the distal phalanx with diffuse soft tissue swelling suspicious for osteomyelitis. Electronically Signed   By: Margarette Canada M.D.   On: 02/21/2018 18:08     EKG: Not done in ED, will get one.   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  Patient does not have leukocytosis or fever, does not meet criteria for sepsis, but the patient has tachycardia with heart rate 101, indicating that the patient is possibly developing early stage of sepsis, therefore broad antibiotics were started in ED. Per Dr. Brennan Bailey note, likely needs amputation procedure in the morning.  - will place on med-srug bed for obs - Empiric antimicrobial treatment with vancomycin (also received 1 dose of cefepim0 in ED) - PRN Zofran for nausea, Norco for pain - Blood cultures x 2  - ESR and CRP - will get Procalcitonin and trend lactic acid levels per sepsis  protocol. - IVF: 2.0 L of NS bolus in ED, followed by 75 cc/h - INR/PTT  Obstructive sleep apnea syndrome, severe: -BiPAP  DM type 2, uncontrolled, with neuropathy ([place]): Last A1c 7.8 on 01/31/13. Pt states that her A1c was 5.6 recently. Patient is taking exenatide, Janumet, glipizide at home -SSI  Depression: -Continue Cymbalta  Normocytic anemia: Hgb 10.6 -f/u by CBC     DVT ppx: SCD Code Status: Full code Family Communication: None at bed side.   Disposition Plan:  Anticipate discharge back to previous home environment Consults called: Dr. Lenon Curt of ortho Admission status:  medical floor/obs     Date of Service 02/21/2018    Ivor Costa [facility] Pager [phone]  If 7PM-7AM, please contact night-coverage [email or web address] Password [email or web address] 02/21/2018, 10:02 PM

## 2018-02-21 NOTE — Progress Notes (Signed)
Pharmacy Antibiotic Note  [name] is a 52 y.o. female admitted on 02/21/2018 with cellulitis.  Pharmacy has been consulted for zyvox dosin ( pt has allergy to vancomycin)  Plan: zyvox 600mg  IV q12h Follow clinical course     Temp (24hrs), Avg:98.8 F (37.1 C), Min:98.8 F (37.1 C), Max:98.8 F (37.1 C)  Recent Labs  Lab 02/21/18 1407  WBC 9.3  CREATININE 0.90    CrCl cannot be calculated (Unknown ideal weight.).    Allergies  Allergen Reactions  . Rocephin [Ceftriaxone] Shortness Of Breath and Swelling  . Daptomycin Other (See Comments)    Develops severe chills, lower back pain, and delayed thinking after receiving this (symptoms eventually lessen)  . Vancomycin Other (See Comments)    Drives creatinine level(s) "through the roof" and they didn't return to normal for approx 14 days    Antimicrobials this admission: 10/28 zyvox >>  Dose adjustments this admission:   Microbiology results: 10/28 BCx:   Thank you for allowing pharmacy to be a part of this patient's care.  Arley Phenix RPh 02/21/2018, 6:12 PM Pager [phone]

## 2018-02-21 NOTE — Progress Notes (Signed)
A consult was received from an ED physician for Vancomycin & Cefepime per pharmacy dosing.  The patient's profile has been reviewed for ht/wt/allergies/indication/available labs.   Per ED PA & chart review--> this patient has tolerated cephalosporins including Cefepime despite Rocephin allergy documentation.  Vancomycin allergy = AKI after prolonged course for osteo.  Patient states she has tolerated Vanc since this occurred in March.   A one time order has been placed for Vancomycin 2gm IV & Cefepime 2gm IV x1.  Further antibiotics/pharmacy consults should be ordered by admitting physician if indicated.                       Thank you, Elson Clan 02/21/2018  7:13 PM

## 2018-02-21 NOTE — Progress Notes (Addendum)
Pharmacy Antibiotic Note  [name] is a 52 y.o. female admitted on 02/21/2018 with osteomyelitis of right index finger.  PMH significant for infected R TKR (+GBS) in 2015,  MSSA infected of L foot prosthesis in 2018 s/p L BKA 08/2017 & amputation of R long finger requiring amputation secondary to osteo requiring multiple courses of prolonged IV antibiotics.  She is followed by Dr Ninetta Lights at [facility] clinic.   Pharmacy has been consulted for Vancomycin dosing. Allergies noted- has tolerated cefazolin & Cefepime despite ceftriaxone allergy.  Vancomycin- caused AKI but patient states she has tolerated Vancomycin since this incident without recurrence of renal insult.  02/21/2018:  Afebrile  WBC & LA WNL  Scr 0.9 (NCrCl~5ml/min)  Xary of rt index finger + osteomyeliitis  Plan: Vancomycin 2gm IV x1 then 1gm IV q12h (goal AUC 400-550) Daily Scr given patient's hx AKI on Vancomycin Monitor renal function and cx data      Temp (24hrs), Avg:98.8 F (37.1 C), Min:98.8 F (37.1 C), Max:98.8 F (37.1 C)  Recent Labs  Lab 02/21/18 1407 02/21/18 1919  WBC 9.3  --   CREATININE 0.90  --   LATICACIDVEN  --  1.62    CrCl cannot be calculated (Unknown ideal weight.).    Allergies  Allergen Reactions  . Rocephin [Ceftriaxone] Shortness Of Breath and Swelling    Tolerated course of Rocephin 6/19 and multiple courses of Ancef/Keflex as well as Cefepime  . Vancomycin Other (See Comments)    Drives creatinine level(s) "through the roof" and they didn't return to normal for approx 14 days Reaction happened in march, has since had vancomycin without problem    Antimicrobials this admission: 10/28 Vanc >>  10/28 Cefepime x1 in ED  Dose adjustments this admission:  Microbiology results: 10/28 BCx:   Thank you for allowing pharmacy to be a part of this patient's care.  Elson Clan 02/21/2018 7:43 PM

## 2018-02-22 ENCOUNTER — Observation Stay (HOSPITAL_COMMUNITY): Payer: BLUE CROSS/BLUE SHIELD | Admitting: Anesthesiology

## 2018-02-22 ENCOUNTER — Encounter (HOSPITAL_COMMUNITY): Payer: Self-pay

## 2018-02-22 ENCOUNTER — Encounter (HOSPITAL_COMMUNITY)
Admission: EM | Disposition: A | Discharge status: Home / Self Care | Payer: Self-pay | Source: Home / Self Care | Attending: Internal Medicine

## 2018-02-22 DIAGNOSIS — E1140 Type 2 diabetes mellitus with diabetic neuropathy, unspecified: Secondary | ICD-10-CM | POA: Diagnosis not present

## 2018-02-22 DIAGNOSIS — M869 Osteomyelitis, unspecified: Secondary | ICD-10-CM | POA: Diagnosis not present

## 2018-02-22 DIAGNOSIS — D649 Anemia, unspecified: Secondary | ICD-10-CM | POA: Diagnosis not present

## 2018-02-22 DIAGNOSIS — E1165 Type 2 diabetes mellitus with hyperglycemia: Secondary | ICD-10-CM | POA: Diagnosis not present

## 2018-02-22 HISTORY — PX: AMPUTATION: SHX166

## 2018-02-22 LAB — CBC
HCT: 31.3 % — ABNORMAL LOW (ref 36.0–46.0)
HEMOGLOBIN: 9.5 g/dL — AB (ref 12.0–15.0)
MCH: 27.8 pg (ref 26.0–34.0)
MCHC: 30.4 g/dL (ref 30.0–36.0)
MCV: 91.5 fL (ref 80.0–100.0)
Platelets: 270 10*3/uL (ref 150–400)
RBC: 3.42 MIL/uL — AB (ref 3.87–5.11)
RDW: 14.5 % (ref 11.5–15.5)
WBC: 6.4 10*3/uL (ref 4.0–10.5)
nRBC: 0 % (ref 0.0–0.2)

## 2018-02-22 LAB — GLUCOSE, CAPILLARY
Glucose-Capillary: 108 mg/dL — ABNORMAL HIGH (ref 70–99)
Glucose-Capillary: 91 mg/dL (ref 70–99)
Glucose-Capillary: 91 mg/dL (ref 70–99)
Glucose-Capillary: 124 mg/dL — ABNORMAL HIGH (ref 70–99)
Glucose-Capillary: 164 mg/dL — ABNORMAL HIGH (ref 70–99)

## 2018-02-22 LAB — BASIC METABOLIC PANEL
ANION GAP: 9 (ref 5–15)
BUN: 15 mg/dL (ref 6–20)
CHLORIDE: 109 mmol/L (ref 98–111)
CO2: 23 mmol/L (ref 22–32)
Calcium: 8.9 mg/dL (ref 8.9–10.3)
Creatinine, Ser: 0.76 mg/dL (ref 0.44–1.00)
GFR calc Af Amer: >60 mL/min (ref >60)
Glucose, Bld: 124 mg/dL — ABNORMAL HIGH (ref 70–99)
POTASSIUM: 4.2 mmol/L (ref 3.5–5.1)
SODIUM: 141 mmol/L (ref 135–145)

## 2018-02-22 LAB — SURGICAL PCR SCREEN
MRSA, PCR: NEGATIVE
Staphylococcus aureus: NEGATIVE

## 2018-02-22 SURGERY — AMPUTATION DIGIT
Anesthesia: Monitor Anesthesia Care | Laterality: Right

## 2018-02-22 MED ORDER — LIDOCAINE HCL (PF) 1 % IJ SOLN
INTRAMUSCULAR | Status: AC
  Filled 2018-02-22: qty 30

## 2018-02-22 MED ORDER — ACETAMINOPHEN 500 MG PO TABS: 1000.0000 mg | ORAL_TABLET | Freq: Once | ORAL | Status: DC | PRN

## 2018-02-22 MED ORDER — FENTANYL CITRATE (PF) 100 MCG/2ML IJ SOLN
25.0000 ug | INTRAMUSCULAR | Status: DC | PRN
  Administered 2018-02-22: 50 ug via INTRAVENOUS
  Administered 2018-02-22 (×2): 25 ug via INTRAVENOUS

## 2018-02-22 MED ORDER — PROPOFOL 10 MG/ML IV BOLUS
INTRAVENOUS | Status: AC
  Filled 2018-02-22: qty 40

## 2018-02-22 MED ORDER — LACTATED RINGERS IV SOLN
INTRAVENOUS | Status: DC
  Administered 2018-02-22: 12:00:00 via INTRAVENOUS

## 2018-02-22 MED ORDER — MIDAZOLAM HCL 2 MG/2ML IJ SOLN
INTRAMUSCULAR | Status: AC
  Filled 2018-02-22: qty 2

## 2018-02-22 MED ORDER — MUPIROCIN 2 % EX OINT
1.0000 "application " | TOPICAL_OINTMENT | Freq: Two times a day (BID) | CUTANEOUS | Status: DC
  Filled 2018-02-22: qty 22

## 2018-02-22 MED ORDER — ACETAMINOPHEN 160 MG/5ML PO SOLN
1000.0000 mg | Freq: Once | ORAL | Status: DC | PRN
  Filled 2018-02-22: qty 40

## 2018-02-22 MED ORDER — PROPOFOL 500 MG/50ML IV EMUL
INTRAVENOUS | Status: DC | PRN
  Administered 2018-02-22: 125 ug/kg/min via INTRAVENOUS

## 2018-02-22 MED ORDER — PROPOFOL 10 MG/ML IV BOLUS
INTRAVENOUS | Status: AC
  Filled 2018-02-22: qty 20

## 2018-02-22 MED ORDER — BUPIVACAINE HCL (PF) 0.5 % IJ SOLN
INTRAMUSCULAR | Status: DC | PRN
  Administered 2018-02-22: 7.5 mL

## 2018-02-22 MED ORDER — FENTANYL CITRATE (PF) 100 MCG/2ML IJ SOLN
INTRAMUSCULAR | Status: AC
  Administered 2018-02-22: 50 ug via INTRAVENOUS
  Filled 2018-02-22: qty 2

## 2018-02-22 MED ORDER — LIDOCAINE HCL 1 % IJ SOLN
INTRAMUSCULAR | Status: DC | PRN
  Administered 2018-02-22: 7.5 mL

## 2018-02-22 MED ORDER — OXYCODONE HCL 5 MG/5ML PO SOLN
5.0000 mg | Freq: Once | ORAL | Status: DC | PRN
  Filled 2018-02-22: qty 5

## 2018-02-22 MED ORDER — OXYCODONE HCL 5 MG PO TABS: 5.0000 mg | ORAL_TABLET | Freq: Once | ORAL | Status: DC | PRN

## 2018-02-22 MED ORDER — FENTANYL CITRATE (PF) 100 MCG/2ML IJ SOLN
INTRAMUSCULAR | Status: AC
  Filled 2018-02-22: qty 2

## 2018-02-22 MED ORDER — BUPIVACAINE HCL (PF) 0.25 % IJ SOLN
INTRAMUSCULAR | Status: AC
  Filled 2018-02-22: qty 30

## 2018-02-22 MED ORDER — OXYCODONE HCL 5 MG PO TABS
5.0000 mg | ORAL_TABLET | Freq: Four times a day (QID) | ORAL | Status: DC | PRN
  Administered 2018-02-22 – 2018-02-23 (×2): 10 mg via ORAL
  Filled 2018-02-22 (×2): qty 2

## 2018-02-22 MED ORDER — 0.9 % SODIUM CHLORIDE (POUR BTL) OPTIME
TOPICAL | Status: DC | PRN
  Administered 2018-02-22: 1000 mL

## 2018-02-22 MED ORDER — MIDAZOLAM HCL 5 MG/5ML IJ SOLN
INTRAMUSCULAR | Status: DC | PRN
  Administered 2018-02-22: 2 mg via INTRAVENOUS

## 2018-02-22 MED ORDER — FENTANYL CITRATE (PF) 100 MCG/2ML IJ SOLN
INTRAMUSCULAR | Status: DC | PRN
  Administered 2018-02-22: 50 ug via INTRAVENOUS

## 2018-02-22 MED ORDER — ACETAMINOPHEN 10 MG/ML IV SOLN: 1000.0000 mg | Freq: Once | INTRAVENOUS | Status: DC | PRN

## 2018-02-22 SURGICAL SUPPLY — 25 items
BAG ZIPLOCK 12X15 (MISCELLANEOUS) IMPLANT
BNDG COHESIVE 1X5 TAN STRL LF (GAUZE/BANDAGES/DRESSINGS) ×3 IMPLANT
BNDG CONFORM 2 STRL LF (GAUZE/BANDAGES/DRESSINGS) ×3 IMPLANT
BNDG GAUZE ELAST 4 BULKY (GAUZE/BANDAGES/DRESSINGS) IMPLANT
CORD BIPOLAR FORCEPS 12FT (ELECTRODE) IMPLANT
COVER SURGICAL LIGHT HANDLE (MISCELLANEOUS) ×3 IMPLANT
COVER WAND RF STERILE (DRAPES) ×3 IMPLANT
CUFF TOURN SGL QUICK 18 (TOURNIQUET CUFF) IMPLANT
CUFF TOURN SGL QUICK 24 (TOURNIQUET CUFF)
CUFF TRNQT CYL 24X4X40X1 (TOURNIQUET CUFF) IMPLANT
DRAPE SURG 17X11 SM STRL (DRAPES) ×6 IMPLANT
ELECT REM PT RETURN 15FT ADLT (MISCELLANEOUS) ×3 IMPLANT
GAUZE SPONGE 4X4 12PLY STRL (GAUZE/BANDAGES/DRESSINGS) ×3 IMPLANT
GAUZE XEROFORM 1X8 LF (GAUZE/BANDAGES/DRESSINGS) ×3 IMPLANT
GLOVE BIOGEL M 8.0 STRL (GLOVE) ×3 IMPLANT
GLOVE BIOGEL PI IND STRL 7.5 (GLOVE) ×1 IMPLANT
GLOVE BIOGEL PI INDICATOR 7.5 (GLOVE) ×2
KIT BASIN OR (CUSTOM PROCEDURE TRAY) ×3 IMPLANT
NEEDLE HYPO 22GX1.5 SAFETY (NEEDLE) ×3 IMPLANT
PACK ORTHO EXTREMITY (CUSTOM PROCEDURE TRAY) ×3 IMPLANT
POSITIONER SURGICAL ARM (MISCELLANEOUS) IMPLANT
SWAB COLLECTION DEVICE MRSA (MISCELLANEOUS) IMPLANT
SWAB CULTURE ESWAB REG 1ML (MISCELLANEOUS) IMPLANT
SYR CONTROL 10ML LL (SYRINGE) ×3 IMPLANT
UNDERPAD 30X30 (UNDERPADS AND DIAPERS) IMPLANT

## 2018-02-22 NOTE — Progress Notes (Signed)
[facility] PROGRESS NOTE    Progress Note  [name]  ZOX:[id number] DOB: 1966-01-29 DOA: 02/21/2018 PCP: [name] Inch, MD     Brief Narrative:   [name] is an 52 y.o. 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  This started about 10 days prior to admission, currently on her eighth day of Keflex without improvement  Assessment/Plan:   Failed outpatient treatment of Osteomyelitis of finger of right hand (HCC): Started on broad-spectrum antibiotics vancomycin and IV cefepime (only 1 dose.) Blood cultures have been ordered and are pending CRP is 6.8, sed rate 71. Right hand index finger x-ray suspicious for osteomyelitis. Hand surgery has been consulted and scheduled for surgery on 02/22/2018  Obstructive sleep apnea syndrome, severe: New BiPAP at night.  DM type 2, uncontrolled, with neuropathy (HCC): A1c 5.6 continue sliding scale insulin, blood glucose is fairly controlled.  Depression Cont meds  Normocytic anemia Hemoglobin has remained fairly stable MCV is 90 in the setting of chronic infection. Will need further follow-up as an outpatient.   DVT prophylaxis: lovenox Family Communication:none Disposition Plan/Barrier to D/C: unable to deterime Code Status:     Code Status Orders  (From admission, onward)         Start     Ordered   02/21/18 1937  Full code  Continuous     02/21/18 1937        Code Status History    Date Active Date Inactive Code Status Order ID Comments User Context   07/12/2017 2358 07/15/2017 1625 Full Code [id number]  Eduard Clos, MD ED   06/08/2017 1635 06/12/2017 2148 Full Code [id number]  Ellwood Dense, DO Inpatient   05/04/2016 1852 05/05/2016 2038 Full Code [id number]  Joseph Art, DO Inpatient   08/03/2014 1656 08/04/2014 1543 Full Code [id number]  Nadara Mustard, MD Inpatient   01/31/2013 2238 02/02/2013 1458 Full Code [id number]  Valeria Batman, MD Inpatient   03/17/2012 0714 03/17/2012 2042 Full Code [id number]  Rosanne Ashing, MD ED   06/23/2011 1154 06/26/2011 1526 Full Code [id number]  Alonna Buckler, RN Inpatient        IV Access:    Peripheral IV   Procedures and diagnostic studies:   Dg Finger Index Right  Result Date: 02/21/2018 CLINICAL DATA:  RIGHT index finger pain and swelling. Laceration 10 days ago. EXAM: RIGHT INDEX FINGER 2+V COMPARISON:  None. FINDINGS: The visualized tuft is displaced from the remainder of the distal phalanx by at least 1 cm. Soft tissue swelling throughout this region noted and findings are suspicious for osteomyelitis. No subluxation or dislocation. No radiopaque foreign bodies are noted. IMPRESSION: Displaced tuft from the remainder of the distal phalanx with diffuse soft tissue swelling suspicious for osteomyelitis. Electronically Signed   By: Harmon Pier M.D.   On: 02/21/2018 18:08     Medical Consultants:    None.  Anti-Infectives:   IV vancomycin  Subjective:    [name] in a good mood currently n.p.o. no complaints.  Objective:    Vitals:   02/21/18 1302 02/21/18 2208 02/22/18 0542  BP: (!) 156/85 (!) 126/54 128/68  Pulse: (!) 101 93 89  Resp: 18 17 16   Temp: 98.8 F (37.1 C)  99.4 F (37.4 C)  TempSrc: Oral  Oral  SpO2: 99% 100% 100%    Intake/Output Summary (Last 24 hours) at 02/22/2018 0742 Last data filed at 02/22/2018 0600 Gross per 24 hour  Intake 632.31 ml  Output 0 ml  Net 632.31 ml   There were no vitals filed for this visit.  Exam: General exam: In no acute distress. Respiratory system: Good air movement and clear to auscultation. Cardiovascular system: S1 & S2 heard, RRR.  Gastrointestinal system:  Abdomen is nondistended, soft and nontender.  Central nervous system: Alert and oriented. No focal neurological deficits. Extremities: No pedal edema. Skin: Purulent right index finger not painful erythematous Psychiatry: Judgement and insight appear normal. Mood & affect appropriate.    Data Reviewed:    Labs: Basic Metabolic Panel: Recent Labs  Lab 02/21/18 1407 02/22/18 0530  NA 139 141  K 4.2 4.2  CL 104 109  CO2 26 23  GLUCOSE 108* 124*  BUN 20 15  CREATININE 0.90 0.76  CALCIUM 9.5 8.9   GFR CrCl cannot be calculated (Unknown ideal weight.). Liver Function Tests: Recent Labs  Lab 02/21/18 1407  AST 13*  ALT 14  ALKPHOS 81  BILITOT 0.7  PROT 8.0  ALBUMIN 3.7   No results for input(s): LIPASE, AMYLASE in the last 168 hours. No results for input(s): AMMONIA in the last 168 hours. Coagulation profile Recent Labs  Lab 02/21/18 2032  INR 0.98    CBC: Recent Labs  Lab 02/21/18 1407 02/22/18 0530  WBC 9.3 6.4  NEUTROABS 6.6  --   HGB 10.6* 9.5*  HCT 34.4* 31.3*  MCV 91.2 91.5  PLT 360 270   Cardiac Enzymes: No results for input(s): CKTOTAL, CKMB, CKMBINDEX, TROPONINI in the last 168 hours. BNP (last 3 results) No results for input(s): PROBNP in the last 8760 hours. CBG: Recent Labs  Lab 02/21/18 2235 02/22/18 0726  GLUCAP 156* 108*   D-Dimer: No results for input(s): DDIMER in the last 72 hours. Hgb A1c: No results for input(s): HGBA1C in the last 72 hours. Lipid Profile: No results for input(s): CHOL, HDL, LDLCALC, TRIG, CHOLHDL, LDLDIRECT in the last 72 hours. Thyroid function studies: No results for input(s): TSH, T4TOTAL, T3FREE, THYROIDAB in the last 72 hours.  Invalid input(s): FREET3 Anemia work up: No results for input(s): VITAMINB12, FOLATE, FERRITIN, TIBC, IRON, RETICCTPCT in the last 72 hours. Sepsis Labs: Recent Labs  Lab 02/21/18 1407 02/21/18 1919 02/21/18 2043 02/22/18 0530  WBC 9.3  --   --  6.4  LATICACIDVEN  --   1.62 1.36  --    Microbiology Recent Results (from the past 240 hour(s))  Wound or Superficial Culture     Status: None (Preliminary result)   Collection Time: 02/21/18  7:30 PM  Result Value Ref Range Status   Specimen Description   Final    WOUND RIGHT FINGER Performed at [facility], [address] [place], [place] [address]    Special Requests   Final    Immunocompromised Performed at [facility], [address], [place], [place] [address]    Gram Stain   Final    RARE WBC PRESENT, PREDOMINANTLY PMN FEW GRAM POSITIVE COCCI IN CHAINS Performed at [facility], [address] [place], [place] [address]    Culture PENDING  Incomplete   Report Status PENDING  Incomplete  Surgical PCR screen     Status: None   Collection Time: 02/22/18  3:23 AM  Result Value Ref Range Status   MRSA, PCR NEGATIVE NEGATIVE Final   Staphylococcus aureus NEGATIVE NEGATIVE Final    Comment: (NOTE) The Xpert SA Assay (FDA approved for NASAL specimens in patients 16 years of age and older), is one component of a comprehensive surveillance program. It is not intended to diagnose infection nor to guide or monitor treatment. Performed at [facility], [address] [place], [place] [address]      Medications:   . diclofenac  75 mg Oral BID  . DULoxetine  60 mg Oral Daily  . insulin aspart  0-5 Units Subcutaneous QHS  . insulin aspart  0-9 Units Subcutaneous TID WC  . multivitamin with minerals  1 tablet Oral Daily  . mupirocin ointment  1 application Nasal BID  . oxybutynin  10 mg Oral QHS  . vitamin B-12  1,000 mcg Oral Daily   Continuous Infusions: . sodium chloride Stopped (02/22/18 0536)  . sodium chloride    . vancomycin 1,000 mg (02/22/18 0536)      LOS: 0 days   Marinda Elk  [facility] Pager [phone]  *Please refer to [email or web address], password [email or web address] to get updated schedule on who will round on this  patient, as hospitalists switch teams weekly. If 7PM-7AM, please contact night-coverage at [email or web address], password [email or web address] for any overnight needs.  02/22/2018, 7:42 AM

## 2018-02-22 NOTE — Progress Notes (Signed)
At 0806, the pt was provided with and signed the surgical consent form and blood consent form. Pt was also notified of being transferred to room 1615.

## 2018-02-22 NOTE — Consult Note (Signed)
Reason for Consult:infection finger Referring Physician: ER  CC:my finger is infected  HPI:  [name] is an 52 y.o. right handed female who presents with  Infection of right index finger.  Pt has had multiple I&D's surgery for infections, this started as a small wound, and has progressed.      .   Pt has some neuropathy of fingers and thus it is not very painful. Associated signs/symptoms: Previous treatment:  Multiple I&D's previous MRSA  Past Medical History:  Diagnosis Date  . Arthritis   . Diabetes mellitus without complication ([place])   . DOE (dyspnea on exertion) 07/12/2017  . FUO (fever of unknown origin) 07/12/2017  . Gastroenteritis 04/2016  . Headache(784.0)    hormonal headache  . Low back pain 07/12/2017  . Neuropathy    bilateral feet  . Obese   . Pleuritic chest pain 07/12/2017  . Pneumonia    hx 20+ years ago  . Sleep apnea    bipap 1 yr  . Ulcer of foot ([place])    toe ulcer greater than a year ago    Past Surgical History:  Procedure Laterality Date  . AMPUTATION Right 06/11/2017   Procedure: RIGHT LONG FINGER AMPUTATION DIGIT;  Surgeon: Milly Jakob, MD;  Location: [facility];  Service: Orthopedics;  Laterality: Right;  . ANKLE FUSION Left 08/03/2014   Procedure: Left Tibiocalcaneal Fusion;  Surgeon: Newt Minion, MD;  Location: [facility];  Service: Orthopedics;  Laterality: Left;  . APPLICATION OF WOUND VAC Left 08/03/2014   Procedure: APPLICATION OF WOUND VAC;  Surgeon: Newt Minion, MD;  Location: [facility];  Service: Orthopedics;  Laterality: Left;  . I&D KNEE WITH POLY EXCHANGE Right 01/31/2013   Procedure: IRRIGATION AND DEBRIDEMENT KNEE WITH POLY EXCHANGE possible Antibiotic Spacer;  Surgeon: Garald Balding, MD;  Location: [facility];  Service: Orthopedics;  Laterality: Right;  . INCISION AND DRAINAGE Right 01/31/2013   ANTIBIOTIC SPACER  RIGHT KNEE  DR Durward Fortes   . KNEE ARTHROSCOPY     left knee  . ORIF ANKLE FRACTURE Left 03/16/2014   Procedure: Foot Excision  Charcot Collapse,  Internal Fixation;  Surgeon: Newt Minion, MD;  Location: [facility];  Service: Orthopedics;  Laterality: Left;  . REPLACEMENT TOTAL KNEE     left  . TOTAL KNEE ARTHROPLASTY  06/23/2011   Procedure: TOTAL KNEE ARTHROPLASTY;  Surgeon: Garald Balding, MD;  Location: [facility];  Service: Orthopedics;  Laterality: Right;  RIGHT TOTAL KNEE REPLACEMENT     Family History  Problem Relation Age of Onset  . Anesthesia problems Father   . Stroke Father   . Hypotension Neg Hx   . Malignant hyperthermia Neg Hx   . Pseudochol deficiency Neg Hx     Social History:  reports that she has never smoked. She has never used smokeless tobacco. She reports that she drinks alcohol. She reports that she does not use drugs.  Allergies:  Allergies  Allergen Reactions  . Rocephin [Ceftriaxone] Shortness Of Breath and Swelling    Tolerated course of Rocephin 6/19 and multiple courses of Ancef/Keflex as well as Cefepime  . Daptomycin Other (See Comments)    Chills and lower back pain   . Vancomycin Other (See Comments)    Drives creatinine level(s) "through the roof" and they didn't return to normal for approx 14 days Reaction happened in march, has since had vancomycin without problem    Medications: I have reviewed the patient's current medications.  Results  for orders placed or performed during the hospital encounter of 02/21/18 (from the past 48 hour(s))  Comprehensive metabolic panel     Status: Abnormal   Collection Time: 02/21/18  2:07 PM  Result Value Ref Range   Sodium 139 135 - 145 mmol/L   Potassium 4.2 3.5 - 5.1 mmol/L   Chloride 104 98 - 111 mmol/L   CO2 26 22 - 32 mmol/L   Glucose, Bld 108 (H) 70 - 99 mg/dL   BUN 20 6 - 20 mg/dL   Creatinine, Ser 0.90 0.44 - 1.00 mg/dL   Calcium 9.5 8.9 - 10.3 mg/dL   Total Protein 8.0 6.5 - 8.1 g/dL   Albumin 3.7 3.5 - 5.0 g/dL   AST 13 (L) 15 - 41 U/L   ALT 14 0 - 44 U/L   Alkaline Phosphatase 81 38 - 126 U/L   Total Bilirubin 0.7 0.3  - 1.2 mg/dL   GFR calc non Af Amer >60 >60 mL/min   GFR calc Af Amer >60 >60 mL/min    Comment: (NOTE) The eGFR has been calculated using the CKD EPI equation. This calculation has not been validated in all clinical situations. eGFR's persistently <60 mL/min signify possible Chronic Kidney Disease.    Anion gap 9 5 - 15    Comment: Performed at [facility], [place] [address] [place], [place] [address]  CBC with Differential     Status: Abnormal   Collection Time: 02/21/18  2:07 PM  Result Value Ref Range   WBC 9.3 4.0 - 10.5 K/uL   RBC 3.77 (L) 3.87 - 5.11 MIL/uL   Hemoglobin 10.6 (L) 12.0 - 15.0 g/dL   HCT 34.4 (L) 36.0 - 46.0 %   MCV 91.2 80.0 - 100.0 fL   MCH 28.1 26.0 - 34.0 pg   MCHC 30.8 30.0 - 36.0 g/dL   RDW 14.4 11.5 - 15.5 %   Platelets 360 150 - 400 K/uL   nRBC 0.0 0.0 - 0.2 %   Neutrophils Relative % 72 %   Neutro Abs 6.6 1.7 - 7.7 K/uL   Lymphocytes Relative 17 %   Lymphs Abs 1.6 0.7 - 4.0 K/uL   Monocytes Relative 6 %   Monocytes Absolute 0.6 0.1 - 1.0 K/uL   Eosinophils Relative 4 %   Eosinophils Absolute 0.4 0.0 - 0.5 K/uL   Basophils Relative 0 %   Basophils Absolute 0.0 0.0 - 0.1 K/uL   Immature Granulocytes 1 %   Abs Immature Granulocytes 0.08 (H) 0.00 - 0.07 K/uL    Comment: Performed at [facility], [place] [address] [place], [place] [address]  I-Stat CG4 Lactic Acid, ED     Status: None   Collection Time: 02/21/18  7:19 PM  Result Value Ref Range   Lactic Acid, Venous 1.62 0.5 - 1.9 mmol/L  Wound or Superficial Culture     Status: None (Preliminary result)   Collection Time: 02/21/18  7:30 PM  Result Value Ref Range   Specimen Description      WOUND RIGHT FINGER Performed at [place] [address] [place], [place] [address]    Special Requests      Immunocompromised Performed at [facility], [place] [address] [place], [place] [address]    Gram Stain      RARE WBC  PRESENT, PREDOMINANTLY PMN FEW GRAM POSITIVE COCCI IN CHAINS Performed at [facility], [place] [address], [place], [place] [address]  Culture PENDING    Report Status PENDING   hCG, quantitative, pregnancy     Status: None   Collection Time: 02/21/18  8:32 PM  Result Value Ref Range   hCG, Beta Chain, Quant, S 1 <5 mIU/mL    Comment:          GEST. AGE      CONC.  (mIU/mL)   <=1 WEEK        5 - 50     2 WEEKS       50 - 500     3 WEEKS       100 - 10,000     4 WEEKS     1,000 - 30,000     5 WEEKS     3,500 - 115,000   6-8 WEEKS     12,000 - 270,000    12 WEEKS     15,000 - 220,000        FEMALE AND NON-PREGNANT FEMALE:     LESS THAN 5 mIU/mL Performed at [facility], [place] [address] [place], [place] [address]   Protime-INR     Status: None   Collection Time: 02/21/18  8:32 PM  Result Value Ref Range   Prothrombin Time 12.9 11.4 - 15.2 seconds   INR 0.98     Comment: Performed at [facility], [place] [address], [place], [place] [address]  APTT     Status: None   Collection Time: 02/21/18  8:32 PM  Result Value Ref Range   aPTT 28 24 - 36 seconds    Comment: Performed at [facility], [place] [address], [place], [place] [address]  Brain natriuretic peptide     Status: None   Collection Time: 02/21/18  8:32 PM  Result Value Ref Range   B Natriuretic Peptide 8.7 0.0 - 100.0 pg/mL    Comment: Performed at [facility], [place] [address], [place], [place] [address]  Sedimentation rate     Status: Abnormal   Collection Time: 02/21/18  8:32 PM  Result Value Ref Range   Sed Rate 71 (H) 0 - 22 mm/hr    Comment: Performed at [facility], [place] [address], [place], [place] [address]  C-reactive protein     Status: Abnormal   Collection Time: 02/21/18  8:32 PM  Result Value Ref Range   CRP 6.8 (H) <1.0 mg/dL    Comment: Performed at [facility], [place] [address]  [place], [place] [address]  I-Stat CG4 Lactic Acid, ED     Status: None   Collection Time: 02/21/18  8:43 PM  Result Value Ref Range   Lactic Acid, Venous 1.36 0.5 - 1.9 mmol/L  Glucose, capillary     Status: Abnormal   Collection Time: 02/21/18 10:35 PM  Result Value Ref Range   Glucose-Capillary 156 (H) 70 - 99 mg/dL  Surgical PCR screen     Status: None   Collection Time: 02/22/18  3:23 AM  Result Value Ref Range   MRSA, PCR NEGATIVE NEGATIVE   Staphylococcus aureus NEGATIVE NEGATIVE    Comment: (NOTE) The Xpert SA Assay (FDA approved for NASAL specimens in patients 56 years of age and older), is one component of a comprehensive surveillance program. It is not intended to diagnose infection nor to guide or monitor treatment. Performed at [facility], [place] [address], [place], [place] [address]   Basic metabolic panel  Status: Abnormal   Collection Time: 02/22/18  5:30 AM  Result Value Ref Range   Sodium 141 135 - 145 mmol/L   Potassium 4.2 3.5 - 5.1 mmol/L   Chloride 109 98 - 111 mmol/L   CO2 23 22 - 32 mmol/L   Glucose, Bld 124 (H) 70 - 99 mg/dL   BUN 15 6 - 20 mg/dL   Creatinine, Ser 0.76 0.44 - 1.00 mg/dL   Calcium 8.9 8.9 - 10.3 mg/dL   GFR calc non Af Amer >60 >60 mL/min   GFR calc Af Amer >60 >60 mL/min    Comment: (NOTE) The eGFR has been calculated using the CKD EPI equation. This calculation has not been validated in all clinical situations. eGFR's persistently <60 mL/min signify possible Chronic Kidney Disease.    Anion gap 9 5 - 15    Comment: Performed at [facility], [place] [address] [place], [place] [address]  CBC     Status: Abnormal   Collection Time: 02/22/18  5:30 AM  Result Value Ref Range   WBC 6.4 4.0 - 10.5 K/uL   RBC 3.42 (L) 3.87 - 5.11 MIL/uL   Hemoglobin 9.5 (L) 12.0 - 15.0 g/dL   HCT 31.3 (L) 36.0 - 46.0 %   MCV 91.5 80.0 - 100.0 fL   MCH 27.8 26.0 - 34.0 pg   MCHC 30.4 30.0 - 36.0 g/dL   RDW 14.5  11.5 - 15.5 %   Platelets 270 150 - 400 K/uL   nRBC 0.0 0.0 - 0.2 %    Comment: Performed at [facility], [place] [address], [place], [place] [address]  Glucose, capillary     Status: Abnormal   Collection Time: 02/22/18  7:26 AM  Result Value Ref Range   Glucose-Capillary 108 (H) 70 - 99 mg/dL  Glucose, capillary     Status: None   Collection Time: 02/22/18 11:26 AM  Result Value Ref Range   Glucose-Capillary 91 70 - 99 mg/dL   Comment 1 Notify RN    Comment 2 Document in Chart     Dg Finger Index Right  Result Date: 02/21/2018 CLINICAL DATA:  RIGHT index finger pain and swelling. Laceration 10 days ago. EXAM: RIGHT INDEX FINGER 2+V COMPARISON:  None. FINDINGS: The visualized tuft is displaced from the remainder of the distal phalanx by at least 1 cm. Soft tissue swelling throughout this region noted and findings are suspicious for osteomyelitis. No subluxation or dislocation. No radiopaque foreign bodies are noted. IMPRESSION: Displaced tuft from the remainder of the distal phalanx with diffuse soft tissue swelling suspicious for osteomyelitis. Electronically Signed   By: Margarette Canada M.D.   On: 02/21/2018 18:08    Pertinent items are noted in HPI. Temp:  [98.3 F (36.8 C)-99.4 F (37.4 C)] 98.3 F (36.8 C) (10/29 0908) Pulse Rate:  [67-101] 67 (10/29 0908) Resp:  [16-18] 16 (10/29 0542) BP: (126-156)/(54-85) 134/83 (10/29 0908) SpO2:  [99 %-100 %] 99 % (10/29 0908) Weight:  [119.8 kg] 119.8 kg (10/29 0800) General appearance: alert and cooperative Resp: clear to auscultation bilaterally Cardio: regular rate and rhythm Extremities: Area of concern is right index finger with necrotic tip, drainage   Assessment: Infected right index finger Plan: Needs amputation. I have discussed this treatment plan in detail with patient, including the risks of the recommended treatment and surgery, the benefits and the alternatives.  The patient understands that additional  treatment may be necessary.  Minoru Chap C Christabella Alvira 02/22/2018, 11:48  AM

## 2018-02-22 NOTE — Anesthesia Preprocedure Evaluation (Addendum)
Anesthesia Evaluation  Patient identified by MRN, date of birth, ID band Patient awake    Reviewed: Allergy & Precautions, NPO status , Patient's Chart, lab work & pertinent test results  History of Anesthesia Complications Negative for: history of anesthetic complications  Airway Mallampati: III  TM Distance: >3 FB   Mouth opening: Pediatric Airway  Dental  (+) Teeth Intact   Pulmonary shortness of breath, sleep apnea ,    breath sounds clear to auscultation       Cardiovascular hypertension, (-) angina+ DOE  (-) Past MI  Rhythm:Regular Rate:Normal  Wall thickness was   increased in a pattern of moderate LVH. Systolic function was   vigorous. The estimated ejection fraction was in the range of 65%   to 70%. Wall motion was normal   Neuro/Psych  Headaches, PSYCHIATRIC DISORDERS Depression  Neuromuscular disease    GI/Hepatic negative GI ROS, Neg liver ROS,   Endo/Other  diabetes, Type 2Morbid obesity  Renal/GU negative Renal ROS  negative genitourinary   Musculoskeletal  (+) Arthritis ,   Abdominal   Peds  Hematology  (+) Blood dyscrasia, anemia ,   Anesthesia Other Findings   Reproductive/Obstetrics                             Anesthesia Physical Anesthesia Plan  ASA: III  Anesthesia Plan: MAC   Post-op Pain Management:    Induction: Intravenous  PONV Risk Score and Plan: 2 and Ondansetron  Airway Management Planned: Nasal Cannula  Additional Equipment: None  Intra-op Plan:   Post-operative Plan:   Informed Consent: I have reviewed the patients History and Physical, chart, labs and discussed the procedure including the risks, benefits and alternatives for the proposed anesthesia with the patient or authorized representative who has indicated his/her understanding and acceptance.   Dental advisory given  Plan Discussed with: CRNA and Surgeon  Anesthesia Plan Comments:         Anesthesia Quick Evaluation

## 2018-02-22 NOTE — Transfer of Care (Signed)
Immediate Anesthesia Transfer of Care Note  Patient: [name]  Procedure(s) Performed: AMPUTATION RIGHT INDEX FINGER (Right )  Patient Location: PACU  Anesthesia Type:MAC  Level of Consciousness: awake, alert  and oriented  Airway & Oxygen Therapy: Patient Spontanous Breathing and Patient connected to face mask oxygen  Post-op Assessment: Report given to RN and Post -op Vital signs reviewed and stable  Post vital signs: Reviewed and stable  Last Vitals:  Vitals Value Taken Time  BP    Temp    Pulse    Resp    SpO2      Last Pain:  Vitals:   02/22/18 0908  TempSrc: Oral  PainSc:       Patients Stated Pain Goal: 2 (24/23/53 6144)  Complications: No apparent anesthesia complications

## 2018-02-22 NOTE — Plan of Care (Signed)

## 2018-02-23 ENCOUNTER — Encounter (HOSPITAL_COMMUNITY): Payer: Self-pay | Admitting: General Surgery

## 2018-02-23 DIAGNOSIS — Z23 Encounter for immunization: Secondary | ICD-10-CM | POA: Diagnosis not present

## 2018-02-23 DIAGNOSIS — M869 Osteomyelitis, unspecified: Secondary | ICD-10-CM | POA: Diagnosis present

## 2018-02-23 DIAGNOSIS — E1140 Type 2 diabetes mellitus with diabetic neuropathy, unspecified: Secondary | ICD-10-CM | POA: Diagnosis present

## 2018-02-23 DIAGNOSIS — Z89512 Acquired absence of left leg below knee: Secondary | ICD-10-CM | POA: Diagnosis not present

## 2018-02-23 DIAGNOSIS — Z96653 Presence of artificial knee joint, bilateral: Secondary | ICD-10-CM | POA: Diagnosis present

## 2018-02-23 DIAGNOSIS — F329 Major depressive disorder, single episode, unspecified: Secondary | ICD-10-CM | POA: Diagnosis present

## 2018-02-23 DIAGNOSIS — Z881 Allergy status to other antibiotic agents status: Secondary | ICD-10-CM | POA: Diagnosis not present

## 2018-02-23 DIAGNOSIS — E1169 Type 2 diabetes mellitus with other specified complication: Secondary | ICD-10-CM | POA: Diagnosis present

## 2018-02-23 DIAGNOSIS — D649 Anemia, unspecified: Secondary | ICD-10-CM | POA: Diagnosis present

## 2018-02-23 DIAGNOSIS — Z981 Arthrodesis status: Secondary | ICD-10-CM | POA: Diagnosis not present

## 2018-02-23 DIAGNOSIS — G4733 Obstructive sleep apnea (adult) (pediatric): Secondary | ICD-10-CM | POA: Diagnosis present

## 2018-02-23 DIAGNOSIS — Z8614 Personal history of Methicillin resistant Staphylococcus aureus infection: Secondary | ICD-10-CM | POA: Diagnosis not present

## 2018-02-23 DIAGNOSIS — L03011 Cellulitis of right finger: Secondary | ICD-10-CM | POA: Diagnosis present

## 2018-02-23 DIAGNOSIS — Z823 Family history of stroke: Secondary | ICD-10-CM | POA: Diagnosis not present

## 2018-02-23 DIAGNOSIS — E11610 Type 2 diabetes mellitus with diabetic neuropathic arthropathy: Secondary | ICD-10-CM | POA: Diagnosis present

## 2018-02-23 DIAGNOSIS — Z96651 Presence of right artificial knee joint: Secondary | ICD-10-CM | POA: Diagnosis present

## 2018-02-23 DIAGNOSIS — Z7984 Long term (current) use of oral hypoglycemic drugs: Secondary | ICD-10-CM | POA: Diagnosis not present

## 2018-02-23 LAB — GLUCOSE, CAPILLARY: Glucose-Capillary: 132 mg/dL — ABNORMAL HIGH (ref 70–99)

## 2018-02-23 LAB — CREATININE, SERUM
CREATININE: 0.73 mg/dL (ref 0.44–1.00)
GFR calc Af Amer: >60 mL/min (ref >60)
GFR calc non Af Amer: >60 mL/min (ref >60)

## 2018-02-23 MED ORDER — SULFAMETHOXAZOLE-TRIMETHOPRIM 800-160 MG PO TABS: 1.0000 | ORAL_TABLET | Freq: Two times a day (BID) | ORAL | 0 refills | Status: DC

## 2018-02-23 MED ORDER — OXYCODONE HCL 5 MG PO TABS: 10.0000 mg | ORAL_TABLET | Freq: Every evening | ORAL | 0 refills | Status: AC | PRN

## 2018-02-23 NOTE — Progress Notes (Signed)
Pt verbalized understanding of DC instructions and scripts to take. She walked by herself to get in her care. Gait is steady

## 2018-02-23 NOTE — Op Note (Signed)
NAMELATRICIA, CERRITO MEDICAL RECORD [id number] ACCOUNT [id number] DATE OF BIRTH:Apr 16, 1966 FACILITY: [facility] LOCATION: [facility] PHYSICIAN:Danon Lograsso C. Brecken Dewoody, MD  OPERATIVE REPORT  DATE OF PROCEDURE:  02/22/2018  PREOPERATIVE DIAGNOSIS:  Severe infection and osteomyelitis of the right index finger.  POSTOPERATIVE DIAGNOSIS:  Severe infection and osteomyelitis of the right index finger.  PROCEDURE:  Amputation of the right index finger with neurectomies and local flap closure.  ANESTHESIA:  IV sedation with local.  ESTIMATED BLOOD LOSS:  Minimal.   COMPLICATIONS: No acute complications.  SPECIMENS:  None.  INDICATIONS:  The patient is a 52 year old young lady with diabetes with a history of multiple partial finger amputations and a brittle diabetic.  She presents with a draining wound from her right index finger.  On examinations the fingertip was necrotic  with bone exposed with a proximal erythema.  Risks, benefits and alternatives of amputation were discussed with her.  She agreed with amputation.  Consent was obtained.  DESCRIPTION OF PROCEDURE:  The patient was taken to the operating room and placed supine on the operating room table.  IV anesthesia was administered.  Local anesthetic was infiltrated by me at the base of the finger.  The finger was evaluated.  There  was some callus formation just proximal to the necrotic portion.  This was debrided and following a fish mouth type design was fashioned proximal to what appeared to be infected tissue.  Incision was made down to the bone circumferentially.  The distal  fingertip was removed.  The bone was then transected more proximally to allow adequate skin rotation and closure.  A portion of the middle phalanx was removed to allow this to happen.  The bone was rongeured and irrigation of the wound was performed.   Hemostasis was obtained with cautery.  The skin edges approximated nicely with multiple interrupted 4-0 nylon sutures.   Xeroform sterile dressing was placed.    The patient tolerated the procedure well and was taken to the recovery room stable.  AN/NUANCE  D:02/23/2018 T:02/23/2018 JOB:[id number]

## 2018-02-24 ENCOUNTER — Encounter: Payer: BLUE CROSS/BLUE SHIELD | Admitting: Physical Therapy

## 2018-02-24 LAB — AEROBIC CULTURE  (SUPERFICIAL SPECIMEN): CULTURE: NORMAL

## 2018-02-24 LAB — AEROBIC CULTURE W GRAM STAIN (SUPERFICIAL SPECIMEN)

## 2018-02-24 NOTE — Discharge Summary (Addendum)
Physician Discharge Summary  [name] UJW:[id number] DOB: 1965/11/08 DOA: 02/21/2018  PCP: [name], MD  Admit date: 02/21/2018 Discharge date: 02/23/2018  Admitted From: Home  Disposition:  Home   Recommendations for Outpatient Follow-up:  1. Follow up with Orthopedics in 1 week 2. Please obtain BMP/CBC in 1-2 weeks     Home Health: None  Equipment/Devices: None  Discharge Condition: Good  CODE STATUS: FULL Diet recommendation: Diabetic  Brief/Interim Summary: [name] is a 52 y.o. 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      PRINCIPAL HOSPITAL DIAGNOSIS: Right first distal phalanx osteomyelitis with cellulitis    Discharge Diagnoses:   Failed outpatient treatment of Osteomyelitis of finger of right hand (HCC): This was started on vancomycin.  X-ray showed destruction of the distal phalanx.  Orthopedics, Dr. Izora Ribas, was consulted who recommended debridement, and the patient had her distal phalanx and portion of the middle phalanx of the right first finger amputated on 10/29 without complications.  She had 24 hours post-operative antibiotics and the surrounding cellulitis resolved.  Given her history of MRSA, she was discharged with Bactrim for 10 days.    Follow up was arranged.  Patient was advised about safe use of pain medication.   Obstructive sleep apnea syndrome, severe: On BiPAP at night.  DM type 2, with neuropathy (HCC): A1c 5.6.  Treated with sliding scale insulin, had good control in hospital.     Depression  Normocytic anemia Hgb remained stable.         Discharge Instructions  Discharge Instructions    Discharge  instructions   Complete by:  As directed    From Dr. Maryfrances Bunnell: You were admitted for infection with gangrene and osteomyelitis of your right index finger.   You were treated with vancomycin and discharged with Bactrim. I do not have any reason to think this is related to your previous staph infections.  Now that the bone infection has been removed, you should complete 10 days of Bactrim. Follow up with Dr. Izora Ribas within 1 week: [facility], [place]  [address] [place], [place] [address]  [phone]  Keep the dressing intact, clean and dry. Call Dr. Moshe Cipro office for a follow up. Resume all of your regular diabetes medications.  Although I think this is not what you would like to hear, I think it advisable to cancel or post-pone your trip to [place] next week, at least until it is clear that this infection is resolved.   Increase activity slowly   Complete by:  As directed      Allergies as of 02/23/2018      Reactions   Rocephin [ceftriaxone] Shortness Of Breath, Swelling   Tolerated course of Rocephin 6/19 and multiple courses of Ancef/Keflex as well as Cefepime   Daptomycin Other (See Comments)   Chills and lower back pain    Vancomycin Other (See Comments)   Drives creatinine level(s) "through the roof" and they didn't return to normal for approx 14 days Reaction happened in march, has since had vancomycin without problem      Medication List    STOP taking these medications   cephALEXin 500 MG capsule Commonly known as:  KEFLEX     TAKE these medications   B-12 1000 MCG Tabs Take 1,000 mcg by mouth daily.   diclofenac 75 MG EC tablet Commonly known as:  VOLTAREN Take 75 mg by mouth 2 (two) times daily.   DULoxetine 30 MG capsule Commonly known as:  CYMBALTA Take 60 mg by mouth daily.   Exenatide ER 2 MG Pen Inject 2 mg into the skin every Tuesday.   glipiZIDE 10 MG 24 hr tablet Commonly known as:  GLUCOTROL XL Take  10 mg by mouth daily with breakfast.   glucosamine-chondroitin 500-400 MG tablet Take 2 tablets by mouth daily.   HYDROcodone-acetaminophen 10-325 MG tablet Commonly known as:  NORCO Take 1 tablet by mouth every 6 (six) hours as needed for moderate pain.   JANUMET XR 50-1000 MG Tb24 Generic drug:  SitaGLIPtin-MetFORMIN HCl Take 1 tablet by mouth at bedtime.   multivitamin with minerals Tabs tablet Take 1 tablet by mouth daily.   ondansetron 8 MG disintegrating tablet Commonly known as:  ZOFRAN-ODT Take 8 mg by mouth every 8 (eight) hours as needed for nausea/vomiting. DISSOLVE IN THE MOUTH   oxybutynin 10 MG 24 hr tablet Commonly known as:  DITROPAN-XL Take 10 mg by mouth at bedtime.   oxyCODONE 5 MG immediate release tablet Commonly known as:  Oxy IR/ROXICODONE Take 2 tablets (10 mg total) by mouth at bedtime as needed for up to 7 days for severe pain or breakthrough pain. What changed:    how much to take  when to take this   sulfamethoxazole-trimethoprim 800-160 MG tablet Commonly known as:  BACTRIM DS,SEPTRA DS Take 1 tablet by mouth 2 (two) times daily.       Allergies  Allergen Reactions  . Rocephin [Ceftriaxone] Shortness Of Breath and Swelling    Tolerated course of Rocephin 6/19 and multiple courses of Ancef/Keflex as well as Cefepime  . Daptomycin Other (See Comments)    Chills and lower back pain   . Vancomycin Other (See Comments)    Drives creatinine level(s) "through the roof" and they didn't return to normal for approx 14 days Reaction happened in march, has since had vancomycin without problem    Consultations:  Orthopedics, Dr. Izora Ribas   Procedures/Studies: Dg Finger Index Right  Result Date: 02/21/2018 CLINICAL DATA:  RIGHT index finger pain and swelling. Laceration 10 days ago. EXAM: RIGHT INDEX FINGER 2+V COMPARISON:  None. FINDINGS: The visualized tuft is displaced from the remainder of the distal phalanx by at least 1 cm. Soft tissue  swelling throughout this region noted and findings are suspicious for osteomyelitis. No subluxation or dislocation. No radiopaque foreign bodies are noted. IMPRESSION: Displaced tuft from the remainder of the distal phalanx with diffuse soft tissue swelling suspicious for osteomyelitis. Electronically Signed   By: Harmon Pier M.D.   On: 02/21/2018 18:08       Subjective: Hand feels better. Pain is manageable.  No more redness, swelling. No confusion, fever.   Discharge Exam: Vitals:   02/22/18 2152 02/23/18 0528  BP: (!) 106/55 (!) 113/46  Pulse: 84 78  Resp: 18 16  Temp: 98.2 F (36.8 C) 98.3 F (36.8 C)  SpO2: 98% 96%   Vitals:   02/22/18 1345 02/22/18 1405 02/22/18 2152 02/23/18 0528  BP: 118/63 121/66 (!) 106/55 (!) 113/46  Pulse: 63 70 84 78  Resp: 12 18 18 16   Temp: 99.1 F (37.3 C) 98.2 F (36.8 C) 98.2 F (36.8 C) 98.3 F (36.8 C)  TempSrc:  Oral Oral Oral  SpO2: 100% 100% 98% 96%  Weight:      Height:        General: Pt is alert, awake, not in acute distress Cardiovascular: RRR, nl S1-S2, no murmurs appreciated.   No LE edema.   Respiratory: Normal respiratory rate and rhythm.  CTAB without rales or wheezes. MSK: Left BKA.  Right hand is bandaged, no surrounding redness or streaking.   Neuro/Psych: Strength symmetric in upper and lower extremities.  Judgment and insight appear normal.   The results of significant diagnostics from this hospitalization (including imaging, microbiology, ancillary and laboratory) are listed below for reference.     Microbiology: Recent Results (from the past 240 hour(s))  Culture, blood (routine x 2)     Status: None (Preliminary result)   Collection Time: 02/21/18  7:28 PM  Result Value Ref Range Status   Specimen Description   Final    BLOOD RIGHT ANTECUBITAL Performed at [facility], [address], [place], [place] [address]    Special Requests   Final    BOTTLES DRAWN AEROBIC AND ANAEROBIC Blood  Culture adequate volume Performed at [facility], [address] [place], [place] [address]    Culture   Final    NO GROWTH 2 DAYS Performed at [facility], [address], [place], [place] [address]    Report Status PENDING  Incomplete  Culture, blood (routine x 2)     Status: None (Preliminary result)   Collection Time: 02/21/18  7:28 PM  Result Value Ref Range Status   Specimen Description   Final    BLOOD LEFT ANTECUBITAL Performed at [facility], [address] [place], [place] [address]    Special Requests   Final    BOTTLES DRAWN AEROBIC AND ANAEROBIC Blood Culture adequate volume Performed at [facility], [address], [place], [place] [address]    Culture   Final    NO GROWTH 2 DAYS Performed at [facility], [address] [place], [place] [address]    Report Status PENDING  Incomplete  Wound or Superficial Culture     Status: None (Preliminary result)   Collection Time: 02/21/18  7:30 PM  Result Value Ref Range Status   Specimen Description   Final    WOUND RIGHT FINGER Performed at [facility], [address], [place], [place] [address]    Special Requests   Final    Immunocompromised Performed at [facility], [address], [place], [place] [address]    Gram Stain   Final    RARE WBC PRESENT, PREDOMINANTLY PMN FEW GRAM POSITIVE COCCI IN CHAINS    Culture   Final    CULTURE REINCUBATED FOR BETTER GROWTH Performed at [facility], [address] [place], [place] [address]    Report Status PENDING  Incomplete  Surgical PCR screen     Status: None   Collection Time: 02/22/18  3:23 AM  Result Value Ref Range Status   MRSA, PCR NEGATIVE NEGATIVE Final   Staphylococcus aureus NEGATIVE NEGATIVE Final    Comment: (NOTE) The Xpert SA Assay (FDA approved for NASAL specimens in patients 65 years of age and older), is one component of a  comprehensive surveillance program. It is not intended to diagnose infection nor to guide or monitor treatment. Performed at [facility], [address], [place], [place] [address]  Labs: BNP (last 3 results) Recent Labs    07/12/17 1133 02/21/18 2032  BNP 92.5 8.7   Basic Metabolic Panel: Recent Labs  Lab 02/21/18 1407 02/22/18 0530 02/23/18 0420  NA 139 141  --   K 4.2 4.2  --   CL 104 109  --   CO2 26 23  --   GLUCOSE 108* 124*  --   BUN 20 15  --   CREATININE 0.90 0.76 0.73  CALCIUM 9.5 8.9  --    Liver Function Tests: Recent Labs  Lab 02/21/18 1407  AST 13*  ALT 14  ALKPHOS 81  BILITOT 0.7  PROT 8.0  ALBUMIN 3.7   No results for input(s): LIPASE, AMYLASE in the last 168 hours. No results for input(s): AMMONIA in the last 168 hours. CBC: Recent Labs  Lab 02/21/18 1407 02/22/18 0530  WBC 9.3 6.4  NEUTROABS 6.6  --   HGB 10.6* 9.5*  HCT 34.4* 31.3*  MCV 91.2 91.5  PLT 360 270   Cardiac Enzymes: No results for input(s): CKTOTAL, CKMB, CKMBINDEX, TROPONINI in the last 168 hours. BNP: Invalid input(s): POCBNP CBG: Recent Labs  Lab 02/22/18 1126 02/22/18 1300 02/22/18 1651 02/22/18 2157 02/23/18 0749  GLUCAP 91 91 124* 164* 132*   D-Dimer No results for input(s): DDIMER in the last 72 hours. Hgb A1c No results for input(s): HGBA1C in the last 72 hours. Lipid Profile No results for input(s): CHOL, HDL, LDLCALC, TRIG, CHOLHDL, LDLDIRECT in the last 72 hours. Thyroid function studies No results for input(s): TSH, T4TOTAL, T3FREE, THYROIDAB in the last 72 hours.  Invalid input(s): FREET3 Anemia work up No results for input(s): VITAMINB12, FOLATE, FERRITIN, TIBC, IRON, RETICCTPCT in the last 72 hours. Urinalysis    Component Value Date/Time   COLORURINE YELLOW 07/12/2017 1623   APPEARANCEUR CLEAR 07/12/2017 1623   LABSPEC 1.023 07/12/2017 1623   PHURINE 6.0 07/12/2017 1623   GLUCOSEU >=500 (A) 07/12/2017 1623    HGBUR SMALL (A) 07/12/2017 1623   BILIRUBINUR NEGATIVE 07/12/2017 1623   KETONESUR NEGATIVE 07/12/2017 1623   PROTEINUR 30 (A) 07/12/2017 1623   UROBILINOGEN 0.2 03/23/2014 1211   NITRITE NEGATIVE 07/12/2017 1623   LEUKOCYTESUR NEGATIVE 07/12/2017 1623   Sepsis Labs Invalid input(s): PROCALCITONIN,  WBC,  LACTICIDVEN Microbiology Recent Results (from the past 240 hour(s))  Culture, blood (routine x 2)     Status: None (Preliminary result)   Collection Time: 02/21/18  7:28 PM  Result Value Ref Range Status   Specimen Description   Final    BLOOD RIGHT ANTECUBITAL Performed at [facility], [address] [place], [place] [address]    Special Requests   Final    BOTTLES DRAWN AEROBIC AND ANAEROBIC Blood Culture adequate volume Performed at [facility], [address] [place], [place] [address]    Culture   Final    NO GROWTH 2 DAYS Performed at [facility], [address], [place], [place] [address]    Report Status PENDING  Incomplete  Culture, blood (routine x 2)     Status: None (Preliminary result)   Collection Time: 02/21/18  7:28 PM  Result Value Ref Range Status   Specimen Description   Final    BLOOD LEFT ANTECUBITAL Performed at [facility], [address], [place], [place] [address]    Special Requests   Final    BOTTLES DRAWN AEROBIC AND ANAEROBIC Blood Culture adequate volume Performed at [facility], [address] [place], [place] [address]    Culture   Final    NO GROWTH 2 DAYS Performed at [facility], [address] [place], [place] [address]    Report Status PENDING  Incomplete  Wound or Superficial Culture     Status: None (Preliminary result)   Collection Time: 02/21/18  7:30 PM  Result Value Ref Range Status   Specimen Description   Final    WOUND RIGHT FINGER Performed at [facility], [address], [place], [place] [address]     Special Requests   Final    Immunocompromised Performed at [facility], [address] [place], [place] [address]    Gram Stain   Final    RARE WBC PRESENT, PREDOMINANTLY PMN FEW GRAM POSITIVE COCCI IN CHAINS    Culture   Final    CULTURE REINCUBATED FOR BETTER GROWTH Performed at [facility], [address], [place], [place] [address]    Report Status PENDING  Incomplete  Surgical PCR screen     Status: None   Collection Time: 02/22/18  3:23 AM  Result Value Ref Range Status   MRSA, PCR NEGATIVE NEGATIVE Final   Staphylococcus aureus NEGATIVE NEGATIVE Final    Comment: (NOTE) The Xpert SA Assay (FDA approved for NASAL specimens in patients 42 years of age and older), is one component of a comprehensive surveillance program. It is not intended to diagnose infection nor to guide or monitor treatment. Performed at [facility], [address], [place], [place] [address]      Time coordinating discharge: 40 minutes The [place] controlled substances registry was reviewed for this patient prior to filling the <5 days supply controlled substances script.      SIGNED:   Alberteen Sam, MD  [facility] 02/23/2018, 11:45 AM

## 2018-02-26 LAB — CULTURE, BLOOD (ROUTINE X 2)
CULTURE: NO GROWTH
Culture: NO GROWTH
Special Requests: ADEQUATE
Special Requests: ADEQUATE

## 2018-02-28 ENCOUNTER — Encounter: Payer: BLUE CROSS/BLUE SHIELD | Admitting: Physical Therapy

## 2018-03-03 ENCOUNTER — Encounter: Payer: BLUE CROSS/BLUE SHIELD | Admitting: Physical Therapy

## 2018-03-08 NOTE — Anesthesia Postprocedure Evaluation (Signed)
Anesthesia Post Note  Patient: [name]  Procedure(s) Performed: AMPUTATION RIGHT INDEX FINGER (Right )     Patient location during evaluation: PACU Anesthesia Type: MAC Level of consciousness: awake and alert Pain management: pain level controlled Vital Signs Assessment: post-procedure vital signs reviewed and stable Respiratory status: spontaneous breathing, nonlabored ventilation, respiratory function stable and patient connected to nasal cannula oxygen Cardiovascular status: stable and blood pressure returned to baseline Postop Assessment: no apparent nausea or vomiting Anesthetic complications: no    Last Vitals:  Vitals:   02/22/18 2152 02/23/18 0528  BP: (!) 106/55 (!) 113/46  Pulse: 84 78  Resp: 18 16  Temp: 36.8 C 36.8 C  SpO2: 98% 96%    Last Pain:  Vitals:   02/23/18 0559  TempSrc:   PainSc: Asleep                 Beckem Tomberlin

## 2018-03-14 ENCOUNTER — Telehealth: Payer: Self-pay | Admitting: Behavioral Health

## 2018-03-14 NOTE — Telephone Encounter (Signed)
Patient called stating she had her index finger on her right rand amputated.  Wanted to follow up with Dr. Ninetta LightsHatcher as she has in the past after having surgery.  Appointment scheduled with Dr. Ninetta LightsHatcher 03/18/2018 Angeline SlimAshley Hill RN

## 2018-03-18 ENCOUNTER — Encounter: Payer: Self-pay | Admitting: Infectious Diseases

## 2018-03-18 ENCOUNTER — Ambulatory Visit (INDEPENDENT_AMBULATORY_CARE_PROVIDER_SITE_OTHER): Payer: BLUE CROSS/BLUE SHIELD | Admitting: Infectious Diseases

## 2018-03-18 VITALS — BP 119/77 | HR 90 | Temp 98.0°F | Wt 268.0 lb

## 2018-03-18 DIAGNOSIS — E1140 Type 2 diabetes mellitus with diabetic neuropathy, unspecified: Secondary | ICD-10-CM | POA: Diagnosis not present

## 2018-03-18 DIAGNOSIS — M869 Osteomyelitis, unspecified: Secondary | ICD-10-CM

## 2018-03-18 MED ORDER — CEPHALEXIN 500 MG PO CAPS: 500.0000 mg | ORAL_CAPSULE | Freq: Two times a day (BID) | ORAL | 3 refills | Status: DC

## 2018-03-18 MED ORDER — MUPIROCIN CALCIUM 2 % NA OINT: 1.0000 "application " | TOPICAL_OINTMENT | Freq: Two times a day (BID) | NASAL | 5 refills | Status: DC

## 2018-03-18 NOTE — Assessment & Plan Note (Addendum)
Will give her chg (she has at home, do not put on or near orifices), mupirocin (6 months), and 3 months of keflex to fu her recent surgery.  Hopefully this will decrease her bacterial load and help to prevent infections Will see her back in 3 months.

## 2018-03-18 NOTE — Progress Notes (Signed)
Subjective:    Patient ID: [name] Head, female    DOB: [birth year], 52 y.o.   MRN: [id number]  HPI 52 yo F with DM2, obesity, left total knee replacement 2007, right total knee replacement 2012. She came to [facility] on January 30, 2013. She underwent irrigation and debridement of her right total knee replacement with exchange of parts. Cipro was added to her antibiotic regimen on October 8. She was found to have group B strep in herjointculture from her outpatient aspiration. On October 9 her antibiotics were changed to ceftriaxone 2 g once daily and she was discharged home. 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   Was planned to stop her anbx at 1 year (Oct 2015) however at ID f/u on 11-17 was continued on anbx through her foot surgery.   After L foot surgery 01-25-15 (5 bones removed, rod from great toe to ankle), she had fevers. She had 2nd anbx added. She had repeat aspirate of her L knee that was clear. She had Cx sent from her foot that was (-).  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  She had further repair of her ankle on July 10, 2016.   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   She was seen in ID on 10-19-16 and was noted to have continued issues with wound. Her anbx were extended to 8-14.   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 She was treated anbx, mupirocin.   Since her last visit 07-2017, she had L BKA and TMR 09-13-17 at [facility]. She had MSSA bacteremia at that time, was treated with nfcillin --> cefazolin. Her TTE was (-). She completed her anbx on 10-19-17.   She returns today after being in hospital 10-28 to 10-30 with R index finger osteo that failed outpt anbx (keflex).She got better initially but then worsened. She was d/c home with bactrim for 10 days.   She is worried why she continues to get  infections.  Believes that nothing else in her life has changed.  Wants to go back on long term anbx (the 2 years I was on anbx were the healthiest 2 years of my life).  Her finger is healing well now. Small amt of clear d/c.  She has recently removed the skin from her distal L 3rd digit.  Recently tested for RA, negative.   Her R foot is more sensitive but has no open wounds. She continues to follow [facility]. Trying to avoid further charcot surgery.  She has had f/u at [facility] for her LLE stump which has been healing well. She feels well about the outcome.   Review of Systems  Constitutional: Negative for chills and fever.  Gastrointestinal: Negative for constipation and diarrhea.  Genitourinary: Negative for difficulty urinating.  Musculoskeletal: Positive for arthralgias.  Psychiatric/Behavioral: Positive for sleep disturbance.  Please see HPI. All other systems reviewed and negative. Uses bipap.  Last A1C 5.6%    Objective:   Physical Exam  Constitutional: She is oriented to person, place, and time. She appears well-developed and well-nourished.  HENT:  Mouth/Throat: No oropharyngeal exudate.  Eyes: Pupils are equal, round, and reactive to light. EOM are normal.  Neck: Normal range of motion. Neck supple.  Cardiovascular: Normal rate, regular rhythm and normal heart sounds.  Pulmonary/Chest:  Effort normal and breath sounds normal.  Abdominal: Soft. Bowel sounds are normal. She exhibits no distension. There is no tenderness.  Lymphadenopathy:    She has no cervical adenopathy.  Neurological: She is alert and oriented to person, place, and time.  Skin:     Psychiatric: She has a normal mood and affect.            Assessment & Plan:

## 2018-03-18 NOTE — Assessment & Plan Note (Signed)
Appears to be doing very well.  Appreciate PCP f/u

## 2018-06-21 ENCOUNTER — Encounter: Payer: Self-pay | Admitting: Infectious Diseases

## 2018-06-21 ENCOUNTER — Ambulatory Visit (INDEPENDENT_AMBULATORY_CARE_PROVIDER_SITE_OTHER): Payer: BLUE CROSS/BLUE SHIELD | Admitting: Infectious Diseases

## 2018-06-21 DIAGNOSIS — B999 Unspecified infectious disease: Secondary | ICD-10-CM | POA: Diagnosis not present

## 2018-06-21 DIAGNOSIS — E1140 Type 2 diabetes mellitus with diabetic neuropathy, unspecified: Secondary | ICD-10-CM | POA: Diagnosis not present

## 2018-06-21 DIAGNOSIS — M869 Osteomyelitis, unspecified: Secondary | ICD-10-CM

## 2018-06-21 MED ORDER — CEPHALEXIN 500 MG PO CAPS: 500.0000 mg | ORAL_CAPSULE | Freq: Two times a day (BID) | ORAL | 3 refills | Status: DC

## 2018-06-21 NOTE — Assessment & Plan Note (Signed)
Appears unchanged.  Trying to increase activity, watch portions.

## 2018-06-21 NOTE — Assessment & Plan Note (Signed)
Will continue her on keflex.  I am not sure that her continued wounds are simply from her picking at her wounds.  I will have her seen by derm for further comment.

## 2018-06-21 NOTE — Assessment & Plan Note (Signed)
She is doing well- last A1C was 6.2%.  Appreciate PCP f/u.

## 2018-06-21 NOTE — Progress Notes (Signed)
   Subjective:    Patient ID: [name], female    DOB: [birth year], 53 y.o.   MRN: [id number]  HPI 54 yo F with DM2, obesity, left total knee replacement 2007, right total knee replacement 2012. She came to [facility] on January 30, 2013. She underwent irrigation and debridement of her right total knee replacement with exchange of parts. Cipro was added to her antibiotic regimen on October 8. She was found to have group B strep in herjointculture from her outpatient aspiration. On October 9 her antibiotics were changed to ceftriaxone 2 g once daily and she was discharged home. 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   Was planned to stop her anbx at 1 year (Oct 2015) however at ID f/u on 11-17 was continued on anbx through her foot surgery.   After L foot surgery 01-25-15 (5 bones removed, rod from great toe to ankle), she had fevers. She had 2nd anbx added. She had repeat aspirate of her L knee that was clear. She had Cx sent from her foot that was (-).  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  She had further repair of her ankle on July 10, 2016.   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   She was seen in ID on 10-19-16 and was noted to have continued issues with wound. Her anbx were extended to 8-14.   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 She was treated anbx, mupirocin.   07-2017, she had L BKA and TMR 09-13-17 at [facility]. She had MSSA bacteremia at that time, was treated with nfcillin -->cefazolin. Her TTE was (-). She completed her anbx on 10-19-17.   In hospital 10-28 to 02-23-18 with R index finger osteo that failed outpt anbx (keflex).She got better initially but then worsened. She was d/c home with bactrim for 10 days.   She has recently removed the skin from her distal L 3rd digit.  She was tested for RA,  negative by rheum at [facility]. She is being treated for OA. He rec vicodin prn.    She continues on po keflex.  Feels well today. No more infections, no fever, no chills.  She has wounds on her hands that she attributes to dry, cracking skin.   Review of Systems  Constitutional: Negative for chills and fever.  Gastrointestinal: Negative for constipation and diarrhea.  Skin: Negative for rash and wound.  Please see HPI. All other systems reviewed and negative.      Objective:   Physical Exam Constitutional:      Appearance: She is obese.  Cardiovascular:     Rate and Rhythm: Normal rate.  Pulmonary:     Effort: Pulmonary effort is normal.     Breath sounds: Normal breath sounds.  Abdominal:     General: Bowel sounds are normal. There is no distension.     Palpations: Abdomen is soft.     Tenderness: There is no abdominal tenderness.  Skin:      Neurological:     Mental Status: She is alert.            Assessment & Plan:

## 2018-12-20 ENCOUNTER — Ambulatory Visit: Payer: BLUE CROSS/BLUE SHIELD | Admitting: Infectious Diseases

## 2018-12-22 ENCOUNTER — Ambulatory Visit: Payer: BLUE CROSS/BLUE SHIELD | Admitting: Infectious Diseases

## 2019-01-05 ENCOUNTER — Ambulatory Visit: Payer: BC Managed Care – PPO | Admitting: Infectious Diseases

## 2019-01-10 ENCOUNTER — Other Ambulatory Visit: Payer: Self-pay

## 2019-01-10 ENCOUNTER — Encounter: Payer: Self-pay | Admitting: Infectious Diseases

## 2019-01-10 ENCOUNTER — Ambulatory Visit (INDEPENDENT_AMBULATORY_CARE_PROVIDER_SITE_OTHER): Payer: BC Managed Care – PPO | Admitting: Infectious Diseases

## 2019-01-10 DIAGNOSIS — E1165 Type 2 diabetes mellitus with hyperglycemia: Secondary | ICD-10-CM | POA: Diagnosis not present

## 2019-01-10 DIAGNOSIS — E1140 Type 2 diabetes mellitus with diabetic neuropathy, unspecified: Secondary | ICD-10-CM | POA: Diagnosis not present

## 2019-01-10 DIAGNOSIS — B999 Unspecified infectious disease: Secondary | ICD-10-CM

## 2019-01-10 DIAGNOSIS — IMO0002 Reserved for concepts with insufficient information to code with codable children: Secondary | ICD-10-CM

## 2019-01-10 NOTE — Assessment & Plan Note (Signed)
Appreciate her PCP f/u She had bump  In her A1C with change in rx.  She is otherwise keeping up very well.

## 2019-01-10 NOTE — Progress Notes (Signed)
Subjective:    Patient ID: [name], female    DOB: [birth year], 53 y.o.   MRN: [id number]  HPI 53yo F with DM2, obesity, left total knee replacement 2007, right total knee replacement 2012. She came to [facility] on January 30, 2013. She underwent irrigation and debridement of her right total knee replacement with exchange of parts. Cipro was added to her antibiotic regimen on October 8. She was found to have group B strep in herjointculture from her outpatient aspiration. On October 9 her antibiotics were changed to ceftriaxone 2 g once daily and she was discharged home. 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   Was planned to stop her anbx at 1 year (Oct 2015) however at ID f/u on 11-17 was continued on anbx through her foot surgery.   After L foot surgery 01-25-15 (5 bones removed, rod from great toe to ankle), she had fevers. She had 2nd anbx added. She had repeat aspirate of her L knee that was clear. She had Cx sent from her foot thatwas (-).  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  She had further repair of her ankle on July 10, 2016.   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   She was seen in ID on 6-25-18and was noted to have continued issues with wound. Her anbx were extended to 8-14.   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 She was treated anbx, mupirocin.  07-2017, she had L BKA and TMR 09-13-17 at [facility]. She had MSSA bacteremia at that time, was treated with nfcillin -->cefazolin.Her TTE was (-). She completed her anbx on 10-19-17.   In hospital 10-28 to 02-23-18 with R index finger osteo that failed outpt anbx (keflex). She got better initially but then worsened. She was d/c home with bactrim for 10 days.   She was seen 05-2018 after she removed the skin from her distal L 3rd digit.  She has  been tested for RA, negative by rheum at [facility]. She is being treated for OA.   She has now started to develop changes in her R foot which are concerning for Charcot foot. She has had f/u with ortho, is getting new orthotics. Possible surgery May 2021.   She has had no further wounds on her hands. She admits to picking at them.  She continues on po keflex. Has had no problems with this. She would like to keep taking this as she has not had further infections while on this.  Was able to ride her horse this AM.   Last A1C was 6.2%. Had optho around Jan 2020.    Review of Systems  Constitutional: Negative for chills and fever.  HENT: Positive for postnasal drip.   Eyes: Negative for visual disturbance.  Respiratory: Negative for cough and shortness of breath.   Gastrointestinal: Negative for constipation and diarrhea.  Genitourinary: Negative for difficulty urinating.  Neurological: Positive for numbness.  stump wound is "perfect".  Please see HPI. All other systems reviewed and negative.      Objective:   Physical Exam Constitutional:      Appearance: Normal appearance. She is obese.  HENT:     Mouth/Throat:     Mouth: Mucous membranes are moist.  Eyes:     Extraocular Movements: Extraocular movements  intact.     Pupils: Pupils are equal, round, and reactive to light.  Neck:     Musculoskeletal: Normal range of motion and neck supple.  Cardiovascular:     Rate and Rhythm: Normal rate and regular rhythm.  Pulmonary:     Effort: Pulmonary effort is normal.     Breath sounds: Normal breath sounds.  Abdominal:     General: Bowel sounds are normal. There is no distension.     Palpations: Abdomen is soft.     Tenderness: There is no abdominal tenderness.  Skin:      Neurological:     Mental Status: She is alert.           Assessment & Plan:

## 2019-01-10 NOTE — Assessment & Plan Note (Signed)
She is doing well on the keflex.  She would like to stay on this as she has not had any further infections.  She has been using hibiclens as well. 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  Will see her back in 1 year.  She does not want PCV/PNVX or Flu vax.

## 2019-01-29 ENCOUNTER — Other Ambulatory Visit: Payer: Self-pay | Admitting: Infectious Diseases

## 2019-01-29 DIAGNOSIS — M869 Osteomyelitis, unspecified: Secondary | ICD-10-CM

## 2019-04-07 HISTORY — PX: MRI: SHX5353

## 2019-04-12 ENCOUNTER — Telehealth: Payer: Self-pay | Admitting: Infectious Diseases

## 2019-04-12 NOTE — Telephone Encounter (Signed)
Pt underwent I & D R foot of 04-07-19 at [facility] No abscess, debrided to bone, home with vac.  Cx of bone showed GPC. BCx negative.  Home on vanco/ertapenem. End date 05-19-19.

## 2019-04-14 NOTE — Telephone Encounter (Addendum)
RN attempting to find home health for patient. She was discharged from [facility] 12/17 with IV antibiotics through 12/23, but no home health nursing for wound care (wound vac to her surgical site) or for labs or PICC maintenance. She will need labs drawn Monday, wound vac changed Mon/Wed/Friday.    [facility] will accept. They may be there Mon/Tues pending nursing  Sharrie Rothman will ask for nursing visit authorization from [facility] for m/t picc draw and lab draw.  [facility]: [phone] Michel Santee) Sharrie Rothman: [phone]

## 2019-04-17 ENCOUNTER — Other Ambulatory Visit: Payer: Self-pay | Admitting: Family

## 2019-04-17 ENCOUNTER — Encounter (HOSPITAL_BASED_OUTPATIENT_CLINIC_OR_DEPARTMENT_OTHER): Payer: BC Managed Care – PPO | Admitting: Internal Medicine

## 2019-04-17 ENCOUNTER — Ambulatory Visit: Payer: BC Managed Care – PPO | Admitting: *Deleted

## 2019-04-17 ENCOUNTER — Other Ambulatory Visit: Payer: Self-pay

## 2019-04-17 DIAGNOSIS — M869 Osteomyelitis, unspecified: Secondary | ICD-10-CM

## 2019-04-17 NOTE — Progress Notes (Signed)
Patient discharged from [facility] with 1 week of IV antibiotics, but no home health nursing.  Emmit Pomfret at [facility] still working with agencies to procure services, although nursing can not be authorized until her next shipment of IV antibiotics.  Patient is changing her own woundvac dressing M-W-F as best she can until nursing starts.  Patient here today for Vanc trough and routine labs. She brought her next dose vancomycin to start after labs are drawn.  PICC is clean, somewhat positional, although RN was able to draw labs without difficulty. Landis Gandy, RN

## 2019-04-18 LAB — BASIC METABOLIC PANEL
BUN: 16 mg/dL (ref 7–25)
CO2: 25 mmol/L (ref 20–32)
Calcium: 9.7 mg/dL (ref 8.6–10.4)
Chloride: 103 mmol/L (ref 98–110)
Creat: 1 mg/dL (ref 0.50–1.05)
Glucose, Bld: 88 mg/dL (ref 65–99)
Potassium: 4.5 mmol/L (ref 3.5–5.3)
Sodium: 140 mmol/L (ref 135–146)

## 2019-04-18 LAB — CBC WITH DIFFERENTIAL/PLATELET
Absolute Monocytes: 509 cells/uL (ref 200–950)
Basophils Absolute: 38 cells/uL (ref 0–200)
Basophils Relative: 0.5 %
Eosinophils Absolute: 334 cells/uL (ref 15–500)
Eosinophils Relative: 4.4 %
HCT: 30.2 % — ABNORMAL LOW (ref 35.0–45.0)
Hemoglobin: 9.8 g/dL — ABNORMAL LOW (ref 11.7–15.5)
Lymphs Abs: 1611 cells/uL (ref 850–3900)
MCH: 28.6 pg (ref 27.0–33.0)
MCHC: 32.5 g/dL (ref 32.0–36.0)
MCV: 88 fL (ref 80.0–100.0)
MPV: 9.8 fL (ref 7.5–12.5)
Monocytes Relative: 6.7 %
Neutro Abs: 5107 cells/uL (ref 1500–7800)
Neutrophils Relative %: 67.2 %
Platelets: 429 10*3/uL — ABNORMAL HIGH (ref 140–400)
RBC: 3.43 10*6/uL — ABNORMAL LOW (ref 3.80–5.10)
RDW: 13.6 % (ref 11.0–15.0)
Total Lymphocyte: 21.2 %
WBC: 7.6 10*3/uL (ref 3.8–10.8)

## 2019-04-18 LAB — SEDIMENTATION RATE: Sed Rate: 75 mm/h — ABNORMAL HIGH (ref 0–30)

## 2019-04-18 LAB — VANCOMYCIN, TROUGH: Vancomycin Tr: 19.5 mg/L (ref 10.0–20.0)

## 2019-04-18 LAB — C-REACTIVE PROTEIN: CRP: 18.1 mg/L — ABNORMAL HIGH (ref <8.0)

## 2019-04-26 ENCOUNTER — Telehealth: Payer: Self-pay | Admitting: *Deleted

## 2019-04-26 NOTE — Telephone Encounter (Signed)
Spoke with pharmacy staff regarding vancomycin levels and increasing creatinine. Vancomycin trough noted to be 27. Will hold vancomycin x 24 hours and then resume at 1 g every 12 hours per pharmacy. Will need to recheck renal function and vancomycin levels.

## 2019-04-26 NOTE — Telephone Encounter (Signed)
Shearon Stalls, pharmacist at [facility], calling for critical labs, advice on vancomycin doxing. Call back is [phone] Vanc dose is currently 1.25 q 12 hours with most recent trough of 27.3 and creatinine of 1.29.  Pharmacist faxed labs for review. He spoke with the patient and recommended her to focus on hydration. Landis Gandy, RN

## 2019-05-01 ENCOUNTER — Other Ambulatory Visit: Payer: Self-pay

## 2019-05-01 ENCOUNTER — Telehealth: Payer: Self-pay

## 2019-05-01 MED ORDER — VANCOMYCIN HCL 10 G IV SOLR: INTRAVENOUS | 0 refills | Status: DC

## 2019-05-01 NOTE — Telephone Encounter (Signed)
COVID-19 Pre-Screening Questions:05/01/2019  Do you currently have a fever (>100 F), chills or unexplained body aches? NO   Are you currently experiencing new cough, shortness of breath, sore throat, runny nose?NO .  Have you recently travelled outside the state of [place] in the last 14 days? NO .  Have you been in contact with someone that is currently pending confirmation of Covid19 testing or has been confirmed to have the Covid19 virus?  NO  **If the patient answers NO to ALL questions -  advise the patient to please call the clinic before coming to the office should any symptoms develop.     

## 2019-05-02 ENCOUNTER — Ambulatory Visit (INDEPENDENT_AMBULATORY_CARE_PROVIDER_SITE_OTHER): Payer: Self-pay | Admitting: Infectious Diseases

## 2019-05-02 ENCOUNTER — Encounter: Payer: Self-pay | Admitting: Infectious Diseases

## 2019-05-02 ENCOUNTER — Other Ambulatory Visit: Payer: Self-pay

## 2019-05-02 DIAGNOSIS — E1140 Type 2 diabetes mellitus with diabetic neuropathy, unspecified: Secondary | ICD-10-CM

## 2019-05-02 DIAGNOSIS — IMO0002 Reserved for concepts with insufficient information to code with codable children: Secondary | ICD-10-CM

## 2019-05-02 DIAGNOSIS — M869 Osteomyelitis, unspecified: Secondary | ICD-10-CM

## 2019-05-02 DIAGNOSIS — N179 Acute kidney failure, unspecified: Secondary | ICD-10-CM | POA: Insufficient documentation

## 2019-05-02 DIAGNOSIS — E1165 Type 2 diabetes mellitus with hyperglycemia: Secondary | ICD-10-CM

## 2019-05-02 NOTE — Assessment & Plan Note (Addendum)
Continues to need improved f/u. Her Glc was 162 on this weeks blood draw.  States she has gotten her flu vax.

## 2019-05-02 NOTE — Assessment & Plan Note (Addendum)
She appears to be doing well Will continue her anbx as schedule Will await her f/u at [facility].  Her esr was 56 at last blood draw (1-4) Was 9 around time of surgery.  Will resume po anbx when IV ends.  Will see her back in 1 month.

## 2019-05-02 NOTE — Assessment & Plan Note (Addendum)
Her Cr increased to 1.49 when vanco Tr was 29.2 Her Cr was 1.27 yesterday and her Vanco Tr is pending.  Cr baseline was 1.0.

## 2019-05-02 NOTE — Progress Notes (Signed)
Subjective:    Patient ID: [name], female    DOB: 10/11/1965, 54 y.o.   MRN: [id number]  HPI 53yo F with DM2, obesity, left total knee replacement 2007, right total knee replacement 2012. She came to [facility] on January 30, 2013. She underwent irrigation and debridement of her right total knee replacement with exchange of parts. Cipro was added to her antibiotic regimen on October 8. She was found to have group B strep in herjointculture from her outpatient aspiration. On October 9 her antibiotics were changed to ceftriaxone 2 g once daily and she was discharged home. 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   Was planned to stop her anbx at 1 year (Oct 2015) however at ID f/u on 11-17 was continued on anbx through her foot surgery.   After L foot surgery 01-25-15 (5 bones removed, rod from great toe to ankle), she had fevers. She had 2nd anbx added. She had repeat aspirate of her L knee that was clear. She had Cx sent from her foot thatwas (-).  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  She had further repair of her ankle on July 10, 2016.   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   She was seen in ID on 6-25-18and was noted to have continued issues with wound. Her anbx were extended to 12-08-16.   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 She was treated anbx, mupirocin.  07-2017, she had L BKA and TMR 09-13-17 at [facility]. She had MSSA bacteremia at that time, was treated with nfcillin -->cefazolin.Her TTE was (-). She completed her anbx on 10-19-17.   In hospital 10-28 to 10-30-19with R index finger osteo that failed outpt anbx (keflex). She got better initially but then worsened. She was d/c home with bactrim for 10 days.   She was seen 05-2018 after she removed the skin from her distal L 3rd digit. She has  beenseen by rheum at [facility] for OA.   She has now started to develop changes in her R foot which are concerning for Charcot foot. She has had f/u with ortho, is getting new orthotics.   She was in [facility] 04-07-19 with I & D of R foot, She was d/c home on vanco/invanz to end on 05-19-19. His Cx was mixed G+.  Her course was complicated by Cr 1.29 and Vanco Tr 27.3 on 04-26-19. Her vanco was held. Dose changed to qday.   She has W[facility] f/u at [facility] tomorrow as well as f/u with her surgeon.  No problem with her anbx, except for timing.  PIC line infuses well but has no "draw". Awaiting lysis rx.   Last A1C was 7.3% (04-06-19) at [facility]. Had optho around Jan 2020.   Review of Systems  Constitutional: Positive for fatigue. Negative for chills and fever.  Eyes: Negative for visual disturbance.  Gastrointestinal: Negative for constipation and diarrhea.  Genitourinary: Negative for difficulty urinating.       Objective:   Physical Exam Constitutional:      Appearance: Normal appearance. She is obese.  HENT:     Mouth/Throat:     Mouth: Mucous membranes are moist.  Eyes:     Extraocular Movements: Extraocular movements intact.     Pupils: Pupils are equal, round, and reactive  to light.  Cardiovascular:     Rate and Rhythm: Normal rate and regular rhythm.  Pulmonary:     Effort: Pulmonary effort is normal.     Breath sounds: Normal breath sounds.  Abdominal:     General: Bowel sounds are normal. There is no distension.     Palpations: Abdomen is soft.     Tenderness: There is no abdominal tenderness.  Musculoskeletal:     Cervical back: Normal range of motion and neck supple.       Feet:  Skin:      Neurological:     Mental Status: She is alert.            Assessment & Plan:

## 2019-05-08 ENCOUNTER — Encounter: Payer: Self-pay | Admitting: Infectious Diseases

## 2019-05-11 ENCOUNTER — Encounter: Payer: Self-pay | Admitting: Infectious Diseases

## 2019-05-29 ENCOUNTER — Telehealth: Payer: Self-pay

## 2019-05-29 NOTE — Telephone Encounter (Signed)
COVID-19 Pre-Screening Questions:05/29/19  Do you currently have a fever (>100 F), chills or unexplained body aches? NO   Are you currently experiencing new cough, shortness of breath, sore throat, runny nose?NO  Have you recently travelled outside the state of [place] in the last 14 days? NO  .  Have you been in contact with someone that is currently pending confirmation of Covid19 testing or has been confirmed to have the Covid19 virus? NO   **If the patient answers NO to ALL questions -  advise the patient to please call the clinic before coming to the office should any symptoms develop.     

## 2019-05-30 ENCOUNTER — Ambulatory Visit (INDEPENDENT_AMBULATORY_CARE_PROVIDER_SITE_OTHER): Payer: Self-pay | Admitting: Infectious Diseases

## 2019-05-30 ENCOUNTER — Encounter: Payer: Self-pay | Admitting: Infectious Diseases

## 2019-05-30 ENCOUNTER — Other Ambulatory Visit: Payer: Self-pay

## 2019-05-30 DIAGNOSIS — M21961 Unspecified acquired deformity of right lower leg: Secondary | ICD-10-CM

## 2019-05-30 DIAGNOSIS — E1140 Type 2 diabetes mellitus with diabetic neuropathy, unspecified: Secondary | ICD-10-CM

## 2019-05-30 NOTE — Progress Notes (Signed)
Subjective:    Patient ID: [name], female    DOB: June 14, 1965, 54 y.o.   MRN: [id number]  HPI 53yo F with DM2, obesity, left total knee replacement 2007, right total knee replacement 2012. She came to [facility] on January 30, 2013. She underwent irrigation and debridement of her right total knee replacement with exchange of parts. Cipro was added to her antibiotic regimen on October 8. She was found to have group B strep in herjointculture from her outpatient aspiration. On October 9 her antibiotics were changed to ceftriaxone 2 g once daily and she was discharged home. 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   Was planned to stop her anbx at 1 year (Oct 2015) however at ID f/u on 11-17 was continued on anbx through her foot surgery.   After L foot surgery 01-25-15 (5 bones removed, rod from great toe to ankle), she had fevers. She had 2nd anbx added. She had repeat aspirate of her L knee that was clear. She had Cx sent from her foot thatwas (-).  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  She had further repair of her ankle on July 10, 2016.   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   She was seen in ID on 6-25-18and was noted to have continued issues with wound. Her anbx were extended to 12-08-16.   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 She was treated anbx, mupirocin.  07-2017, she had L BKA and TMR 09-13-17 at [facility]. She had MSSA bacteremia at that time, was treated with nfcillin -->cefazolin.Her TTE was (-). She completed her anbx on 10-19-17.   In hospital 10-28 to 10-30-19with R index finger osteo that failed outpt anbx (keflex). She got better initially but then worsened. She was d/c home with bactrim for 10 days.   Shewas seen 05-2018 after sheremoved the skin from her distal L 3rd digit. Shehas  beenseen by rheum at [facility] for OA.    She was in [facility] 04-07-19 with I & D of R foot, She was d/c home on vanco/invanz to end on 05-19-19. Her Cx was mixed G+.  Her course was complicated by Cr 1.29 and Vanco Tr 27.3 on 04-26-19.  Her PIC has been pulled, she is back to keflex now.   She is scheduled for reconstructive surgery of her R foot 07-04-19.  Wound is healing well. She has wound vac. Has decreased in size by 50%. Hopeful that she will have surgery to close her wound at time of reconstructive surgery as well.   She is non-wt bearing. Has help from husband.     Last A1C was 7.3% (04-06-19) at [facility]. Had optho 2020. Knows she is due.   Review of Systems  Constitutional: Positive for appetite change. Negative for chills and fever.  Respiratory: Negative for cough and shortness of breath.   Gastrointestinal: Negative for diarrhea and nausea.  Genitourinary: Negative for difficulty urinating.  Skin: Positive for wound.       Objective:   Physical Exam Vitals reviewed.  Constitutional:      Appearance: Normal appearance. She is obese.  HENT:     Mouth/Throat:     Mouth: Mucous membranes are moist.  Eyes:     Extraocular Movements: Extraocular movements intact.  Pupils: Pupils are equal, round, and reactive to light.  Cardiovascular:     Rate and Rhythm: Normal rate and regular rhythm.  Pulmonary:     Effort: Pulmonary effort is normal.     Breath sounds: Normal breath sounds.  Abdominal:     General: Bowel sounds are normal. There is no distension.     Palpations: Abdomen is soft.     Tenderness: There is no abdominal tenderness.  Musculoskeletal:     Cervical back: Normal range of motion and neck supple.       Feet:  Neurological:     Mental Status: She is alert.  Psychiatric:        Mood and Affect: Mood normal.         Assessment & Plan:

## 2019-05-30 NOTE — Assessment & Plan Note (Signed)
She is improving Will continue her oral keflex til her surgery If her surgeon feels that her wound is improved and no evidence of infection at time of surgery, would feel ok with stopping.  Await her surgery next month and will see her after.

## 2019-05-30 NOTE — Assessment & Plan Note (Signed)
Last A1C was 7.2% (prior between 5.8 and 6.2) She will get eye exam when her foot is improved.

## 2019-07-18 ENCOUNTER — Ambulatory Visit: Payer: Medicaid Other | Admitting: Infectious Diseases

## 2019-07-27 ENCOUNTER — Ambulatory Visit: Payer: Self-pay | Attending: Internal Medicine

## 2019-07-27 DIAGNOSIS — Z23 Encounter for immunization: Secondary | ICD-10-CM

## 2019-07-27 NOTE — Progress Notes (Signed)
   Covid-19 Vaccination Clinic  Name:  [name]    MRN: [id number] DOB: [birth year]  07/27/2019  [name] was observed post Covid-19 immunization for 15 minutes without incident. She was provided with Vaccine Information Sheet and instruction to access the V-Safe system.   [name] was instructed to call 911 with any severe reactions post vaccine: Marland Kitchen Difficulty breathing  . Swelling of face and throat  . A fast heartbeat  . A bad rash all over body  . Dizziness and weakness   Immunizations Administered    Name Date Dose VIS Date Route   Pfizer COVID-19 Vaccine 07/27/2019  1:56 PM 0.3 mL 04/07/2019 Intramuscular   Manufacturer: [employer], [employer]   Lot: [id number]   NDC: [address]-3

## 2019-08-22 ENCOUNTER — Ambulatory Visit: Payer: Medicaid Other | Attending: Internal Medicine

## 2019-08-22 ENCOUNTER — Telehealth: Payer: Self-pay

## 2019-08-22 DIAGNOSIS — Z23 Encounter for immunization: Secondary | ICD-10-CM

## 2019-08-22 NOTE — Progress Notes (Signed)
   Covid-19 Vaccination Clinic  Name:  SKY PRIMO    MRN: [id number] DOB: 26-Feb-1966  08/22/2019  Ms. Sanguinetti was observed post Covid-19 immunization for 30 minutes based on pre-vaccination screening without incident. She was provided with Vaccine Information Sheet and instruction to access the V-Safe system.   Ms. Groleau was instructed to call 911 with any severe reactions post vaccine: Marland Kitchen Difficulty breathing  . Swelling of face and throat  . A fast heartbeat  . A bad rash all over body  . Dizziness and weakness   Immunizations Administered    Name Date Dose VIS Date Route   Pfizer COVID-19 Vaccine 08/22/2019  4:58 PM 0.3 mL 06/21/2018 Intramuscular   Manufacturer: [employer], [employer]   Lot: [id number]   NDC: [address]-1

## 2019-08-22 NOTE — Telephone Encounter (Signed)
Patient called office today to follow up on her appointment with MD on the 29th. Patient states that she is non- weight bearing due to recent surgery. Is still taking medication as prescribed; states everything is still the same.  Switched patient's appointment to E-visit.  Lorenso Courier, [place]

## 2019-08-24 ENCOUNTER — Telehealth (INDEPENDENT_AMBULATORY_CARE_PROVIDER_SITE_OTHER): Payer: Self-pay | Admitting: Infectious Diseases

## 2019-08-24 ENCOUNTER — Encounter: Payer: Self-pay | Admitting: Infectious Diseases

## 2019-08-24 ENCOUNTER — Other Ambulatory Visit: Payer: Self-pay

## 2019-08-24 DIAGNOSIS — E1140 Type 2 diabetes mellitus with diabetic neuropathy, unspecified: Secondary | ICD-10-CM

## 2019-08-24 DIAGNOSIS — IMO0002 Reserved for concepts with insufficient information to code with codable children: Secondary | ICD-10-CM

## 2019-08-24 DIAGNOSIS — M869 Osteomyelitis, unspecified: Secondary | ICD-10-CM

## 2019-08-24 DIAGNOSIS — B999 Unspecified infectious disease: Secondary | ICD-10-CM

## 2019-08-24 DIAGNOSIS — E1165 Type 2 diabetes mellitus with hyperglycemia: Secondary | ICD-10-CM

## 2019-08-24 MED ORDER — CEPHALEXIN 500 MG PO CAPS: ORAL_CAPSULE | ORAL | 5 refills | Status: DC

## 2019-08-24 NOTE — Assessment & Plan Note (Signed)
She appears to have better control.  Will continue to monitor with the help of her PCP.  She will see ophtho when she is not in wheel chair.

## 2019-08-24 NOTE — Progress Notes (Signed)
   Subjective:    Patient ID: [name], female    DOB: 12-05-1965, 54 y.o.   MRN: [id number]  HPI 53yo F with DM2, obesity, left total knee replacement 2007, right total knee replacement 2012. She came to [facility] on January 30, 2013. She underwent irrigation and debridement of [name] right total knee replacement with exchange of parts. Cipro was added to [name] antibiotic regimen on October 8. She was found to have group B strep in herjointculture from [name] outpatient aspiration. On October 9 [name] antibiotics were changed to ceftriaxone 2 g once daily and she was discharged home. Of note, in hospital she was also found to have GBS in [name] UCx.   Was planned to stop [name] anbx at 1 year (Oct 2015) however at ID f/u on 11-17 was continued on anbx through [name] foot surgery.   After L foot surgery 01-25-15 (5 bones removed, rod from great toe to ankle), she had fevers. She had 2nd anbx added. She had repeat aspirate of [name] L knee that was clear. She had Cx sent from [name] foot thatwas (-).  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  She had further repair of [name] ankle on July 10, 2016.   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   She was seen in ID on 6-25-18and was noted to have continued issues with wound. [name] anbx were extended to 12-08-16.   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 She was treated anbx, mupirocin.  07-2017, she had L BKA and TMR 09-13-17 at [facility]. She had MSSA bacteremia at that time, was treated with nfcillin -->cefazolin.[name] TTE was (-). She completed [name] anbx on 10-19-17.   In hospital 10-28 to 10-30-19with R index finger osteo that failed outpt anbx (keflex). She got better initially but then worsened. She was d/c home with bactrim for 10 days.   Shewas seen 05-2018 after sheremoved the skin from [name] distal L 3rd digit. Shehas  beenseenby rheum at [place] for OA.    She was in [facility] 04-07-19 with I &D of R foot, She was d/c home on vanco/invanz to end on 05-19-19. [name] Cx was mixed G+.  [name] course was complicated by Cr 1.29 and Vanco Tr 27.3 on 04-26-19.  She had reconstructive surgery of [name] R foot 07-04-19. She was continued on keflex through [name] surgery.  She was seen in surgery 08-04-19 and had sutures out, fiberglass cast applied.  She is having f/u at [facility] today. She is awaiting f/u xrays.  [name] wounds are closed (1 small scab is all that remains).   She is non-wt bearing until she has xrays to clear. Now in boot with goal for limited weight bearing. Has help from husband.   Has had COVID vax x 2.  Last A1C was76.3% (?) at [facility] per pt.  Had optho 2020. Knows she is due, but can't get in with a wheelchair.    Review of Systems  Constitutional: Negative for chills and fever.  Gastrointestinal: Negative for constipation and diarrhea.  Genitourinary: Negative for difficulty urinating.  Skin: Negative for wound.  Please see HPI. All other systems reviewed and negative.      Objective:   Physical Exam Phone visit.        Assessment & Plan:

## 2019-08-24 NOTE — Assessment & Plan Note (Signed)
She appears to be doing well with her most recent surgery.  We agreed to keep her on keflex for 6 more weeks post surgery and then re-eval.  She will f/u in ID clinic at that time.

## 2020-02-20 ENCOUNTER — Other Ambulatory Visit: Payer: Self-pay | Admitting: Nurse Practitioner

## 2020-02-20 DIAGNOSIS — U071 COVID-19: Secondary | ICD-10-CM

## 2020-02-20 NOTE — Progress Notes (Signed)
I connected by phone with [name] on 02/20/2020 at 4:41 PM to discuss the potential use of an new treatment for mild to moderate COVID-19 viral infection in non-hospitalized patients.  This patient is a 54 y.o. female that meets the FDA criteria for Emergency Use Authorization of bamlanivimab/etesevimab or casirivimab\imdevimab.  Has a (+) direct SARS-CoV-2 viral test result  Has mild or moderate COVID-19   Is ? 54 years of age and weighs ? 40 kg  Is NOT hospitalized due to COVID-19  Is NOT requiring oxygen therapy or requiring an increase in baseline oxygen flow rate due to COVID-19  Is within 10 days of symptom onset  Has at least one of the high risk factor(s) for progression to severe COVID-19 and/or hospitalization as defined in EUA.  Specific high risk criteria : BMI > 25 and Diabetes   I have spoken and communicated the following to the patient or parent/caregiver:  1. FDA has authorized the emergency use of bamlanivimab/etesevimab and casirivimab\imdevimab for the treatment of mild to moderate COVID-19 in adults and pediatric patients with positive results of direct SARS-CoV-2 viral testing who are 57 years of age and older weighing at least 40 kg, and who are at high risk for progressing to severe COVID-19 and/or hospitalization.  2. The significant known and potential risks and benefits of bamlanivimab/etesevimab and casirivimab\imdevimab, and the extent to which such potential risks and benefits are unknown.  3. Information on available alternative treatments and the risks and benefits of those alternatives, including clinical trials.  4. Patients treated with bamlanivimab/etesevimab and casirivimab\imdevimab should continue to self-isolate and use infection control measures (e.g., wear mask, isolate, social distance, avoid sharing personal items, clean and disinfect "high touch" surfaces, and frequent handwashing) according to CDC guidelines.   5. The patient or  parent/caregiver has the option to accept or refuse bamlanivimab/etesevimab or casirivimab\imdevimab .  After reviewing this information with the patient, the patient has agreed to receive one of the available covid 19 monoclonal antibodies and will be provided an appropriate fact sheet prior to infusion.Consuello Masse, DNP, AGNP-C [phone] (Infusion Center Hotline)

## 2020-02-21 ENCOUNTER — Ambulatory Visit (HOSPITAL_COMMUNITY)
Admission: RE | Admit: 2020-02-21 | Discharge: 2020-02-21 | Disposition: A | Discharge status: Home / Self Care | Payer: 59 | Source: Ambulatory Visit | Attending: Pulmonary Disease | Admitting: Pulmonary Disease

## 2020-02-21 DIAGNOSIS — U071 COVID-19: Secondary | ICD-10-CM | POA: Insufficient documentation

## 2020-02-21 MED ORDER — METHYLPREDNISOLONE SODIUM SUCC 125 MG IJ SOLR: 125.0000 mg | Freq: Once | INTRAMUSCULAR | Status: DC | PRN

## 2020-02-21 MED ORDER — SODIUM CHLORIDE 0.9 % IV SOLN: Freq: Once | INTRAVENOUS | Status: AC

## 2020-02-21 MED ORDER — ALBUTEROL SULFATE HFA 108 (90 BASE) MCG/ACT IN AERS: 2.0000 | INHALATION_SPRAY | Freq: Once | RESPIRATORY_TRACT | Status: DC | PRN

## 2020-02-21 MED ORDER — FAMOTIDINE IN NACL 20-0.9 MG/50ML-% IV SOLN: 20.0000 mg | Freq: Once | INTRAVENOUS | Status: DC | PRN

## 2020-02-21 MED ORDER — SODIUM CHLORIDE 0.9 % IV SOLN: INTRAVENOUS | Status: DC | PRN

## 2020-02-21 MED ORDER — DIPHENHYDRAMINE HCL 50 MG/ML IJ SOLN: 50.0000 mg | Freq: Once | INTRAMUSCULAR | Status: DC | PRN

## 2020-02-21 MED ORDER — EPINEPHRINE 0.3 MG/0.3ML IJ SOAJ: 0.3000 mg | Freq: Once | INTRAMUSCULAR | Status: DC | PRN

## 2020-02-21 NOTE — Discharge Instructions (Signed)

## 2020-02-21 NOTE — Progress Notes (Signed)
  Diagnosis: COVID-19  Physician: Dr. Wright  Procedure: Covid Infusion Clinic Med: bamlanivimab\etesevimab infusion - Provided patient with bamlanimivab\etesevimab fact sheet for patients, parents and caregivers prior to infusion.  Complications: No immediate complications noted.  Discharge: Discharged home   [name] 02/21/2020  

## 2020-08-01 ENCOUNTER — Inpatient Hospital Stay (HOSPITAL_BASED_OUTPATIENT_CLINIC_OR_DEPARTMENT_OTHER)
Admission: EM | Admit: 2020-08-01 | Discharge: 2020-08-09 | DRG: 853 | Disposition: A | Discharge status: Home / Self Care | Payer: 59 | Attending: Internal Medicine | Admitting: Internal Medicine

## 2020-08-01 ENCOUNTER — Emergency Department (HOSPITAL_BASED_OUTPATIENT_CLINIC_OR_DEPARTMENT_OTHER): Payer: 59 | Admitting: Radiology

## 2020-08-01 ENCOUNTER — Emergency Department (HOSPITAL_COMMUNITY): Payer: 59 | Admitting: Certified Registered"

## 2020-08-01 ENCOUNTER — Encounter (HOSPITAL_BASED_OUTPATIENT_CLINIC_OR_DEPARTMENT_OTHER): Payer: Self-pay | Admitting: Emergency Medicine

## 2020-08-01 ENCOUNTER — Encounter (HOSPITAL_COMMUNITY)
Admission: EM | Disposition: A | Discharge status: Home / Self Care | Payer: Self-pay | Source: Home / Self Care | Attending: Internal Medicine

## 2020-08-01 ENCOUNTER — Inpatient Hospital Stay: Admit: 2020-08-01 | Payer: 59 | Admitting: Orthopaedic Surgery

## 2020-08-01 ENCOUNTER — Other Ambulatory Visit: Payer: Self-pay

## 2020-08-01 DIAGNOSIS — M86042 Acute hematogenous osteomyelitis, left hand: Secondary | ICD-10-CM | POA: Diagnosis not present

## 2020-08-01 DIAGNOSIS — R6520 Severe sepsis without septic shock: Secondary | ICD-10-CM | POA: Diagnosis present

## 2020-08-01 DIAGNOSIS — Z20822 Contact with and (suspected) exposure to covid-19: Secondary | ICD-10-CM | POA: Diagnosis present

## 2020-08-01 DIAGNOSIS — F32A Depression, unspecified: Secondary | ICD-10-CM | POA: Diagnosis present

## 2020-08-01 DIAGNOSIS — Z7984 Long term (current) use of oral hypoglycemic drugs: Secondary | ICD-10-CM

## 2020-08-01 DIAGNOSIS — Z881 Allergy status to other antibiotic agents status: Secondary | ICD-10-CM | POA: Diagnosis not present

## 2020-08-01 DIAGNOSIS — E872 Acidosis: Secondary | ICD-10-CM | POA: Diagnosis present

## 2020-08-01 DIAGNOSIS — Z8616 Personal history of COVID-19: Secondary | ICD-10-CM | POA: Diagnosis not present

## 2020-08-01 DIAGNOSIS — Z79899 Other long term (current) drug therapy: Secondary | ICD-10-CM | POA: Diagnosis not present

## 2020-08-01 DIAGNOSIS — L0390 Cellulitis, unspecified: Secondary | ICD-10-CM

## 2020-08-01 DIAGNOSIS — M1990 Unspecified osteoarthritis, unspecified site: Secondary | ICD-10-CM | POA: Diagnosis present

## 2020-08-01 DIAGNOSIS — M86142 Other acute osteomyelitis, left hand: Secondary | ICD-10-CM | POA: Diagnosis present

## 2020-08-01 DIAGNOSIS — E1140 Type 2 diabetes mellitus with diabetic neuropathy, unspecified: Secondary | ICD-10-CM | POA: Diagnosis not present

## 2020-08-01 DIAGNOSIS — Z981 Arthrodesis status: Secondary | ICD-10-CM

## 2020-08-01 DIAGNOSIS — M869 Osteomyelitis, unspecified: Secondary | ICD-10-CM | POA: Diagnosis present

## 2020-08-01 DIAGNOSIS — A419 Sepsis, unspecified organism: Principal | ICD-10-CM

## 2020-08-01 DIAGNOSIS — L03114 Cellulitis of left upper limb: Secondary | ICD-10-CM | POA: Diagnosis present

## 2020-08-01 DIAGNOSIS — U071 COVID-19: Secondary | ICD-10-CM | POA: Diagnosis not present

## 2020-08-01 DIAGNOSIS — E1169 Type 2 diabetes mellitus with other specified complication: Secondary | ICD-10-CM | POA: Diagnosis present

## 2020-08-01 DIAGNOSIS — E1143 Type 2 diabetes mellitus with diabetic autonomic (poly)neuropathy: Secondary | ICD-10-CM | POA: Diagnosis present

## 2020-08-01 DIAGNOSIS — Z89512 Acquired absence of left leg below knee: Secondary | ICD-10-CM | POA: Diagnosis not present

## 2020-08-01 DIAGNOSIS — B9562 Methicillin resistant Staphylococcus aureus infection as the cause of diseases classified elsewhere: Secondary | ICD-10-CM | POA: Diagnosis present

## 2020-08-01 DIAGNOSIS — G4733 Obstructive sleep apnea (adult) (pediatric): Secondary | ICD-10-CM | POA: Diagnosis not present

## 2020-08-01 DIAGNOSIS — M609 Myositis, unspecified: Secondary | ICD-10-CM | POA: Diagnosis not present

## 2020-08-01 DIAGNOSIS — E1152 Type 2 diabetes mellitus with diabetic peripheral angiopathy with gangrene: Secondary | ICD-10-CM | POA: Diagnosis present

## 2020-08-01 DIAGNOSIS — Z96653 Presence of artificial knee joint, bilateral: Secondary | ICD-10-CM | POA: Diagnosis not present

## 2020-08-01 DIAGNOSIS — Z6841 Body Mass Index (BMI) 40.0 and over, adult: Secondary | ICD-10-CM

## 2020-08-01 DIAGNOSIS — Z1623 Resistance to quinolones and fluoroquinolones: Secondary | ICD-10-CM | POA: Diagnosis present

## 2020-08-01 DIAGNOSIS — E1165 Type 2 diabetes mellitus with hyperglycemia: Secondary | ICD-10-CM | POA: Diagnosis not present

## 2020-08-01 DIAGNOSIS — Z5320 Procedure and treatment not carried out because of patient's decision for unspecified reasons: Secondary | ICD-10-CM | POA: Diagnosis not present

## 2020-08-01 DIAGNOSIS — IMO0002 Reserved for concepts with insufficient information to code with codable children: Secondary | ICD-10-CM | POA: Diagnosis present

## 2020-08-01 DIAGNOSIS — M89542 Osteolysis, left hand: Secondary | ICD-10-CM | POA: Diagnosis present

## 2020-08-01 DIAGNOSIS — Z823 Family history of stroke: Secondary | ICD-10-CM

## 2020-08-01 HISTORY — PX: I & D EXTREMITY: SHX5045

## 2020-08-01 LAB — COMPREHENSIVE METABOLIC PANEL
ALT: 19 U/L (ref 0–44)
AST: 17 U/L (ref 15–41)
Albumin: 3.7 g/dL (ref 3.5–5.0)
Alkaline Phosphatase: 87 U/L (ref 38–126)
Anion gap: 13 (ref 5–15)
BUN: 16 mg/dL (ref 6–20)
CO2: 22 mmol/L (ref 22–32)
Calcium: 9.5 mg/dL (ref 8.9–10.3)
Chloride: 98 mmol/L (ref 98–111)
Creatinine, Ser: 1 mg/dL (ref 0.44–1.00)
GFR, Estimated: >60 mL/min (ref >60)
Glucose, Bld: 452 mg/dL — ABNORMAL HIGH (ref 70–99)
Potassium: 4.1 mmol/L (ref 3.5–5.1)
Sodium: 133 mmol/L — ABNORMAL LOW (ref 135–145)
Total Bilirubin: 0.6 mg/dL (ref 0.3–1.2)
Total Protein: 6.9 g/dL (ref 6.5–8.1)

## 2020-08-01 LAB — CBC WITH DIFFERENTIAL/PLATELET
Abs Immature Granulocytes: 0.03 10*3/uL (ref 0.00–0.07)
Basophils Absolute: 0 10*3/uL (ref 0.0–0.1)
Basophils Relative: 0 %
Eosinophils Absolute: 0.1 10*3/uL (ref 0.0–0.5)
Eosinophils Relative: 2 %
HCT: 32 % — ABNORMAL LOW (ref 36.0–46.0)
Hemoglobin: 10.4 g/dL — ABNORMAL LOW (ref 12.0–15.0)
Immature Granulocytes: 0 %
Lymphocytes Relative: 9 %
Lymphs Abs: 0.8 10*3/uL (ref 0.7–4.0)
MCH: 29.6 pg (ref 26.0–34.0)
MCHC: 32.5 g/dL (ref 30.0–36.0)
MCV: 91.2 fL (ref 80.0–100.0)
Monocytes Absolute: 0.6 10*3/uL (ref 0.1–1.0)
Monocytes Relative: 7 %
Neutro Abs: 7.1 10*3/uL (ref 1.7–7.7)
Neutrophils Relative %: 82 %
Platelets: 208 10*3/uL (ref 150–400)
RBC: 3.51 MIL/uL — ABNORMAL LOW (ref 3.87–5.11)
RDW: 14.8 % (ref 11.5–15.5)
WBC: 8.8 10*3/uL (ref 4.0–10.5)
nRBC: 0 % (ref 0.0–0.2)

## 2020-08-01 LAB — RESP PANEL BY RT-PCR (FLU A&B, COVID) ARPGX2
Influenza A by PCR: NEGATIVE
Influenza B by PCR: NEGATIVE
SARS Coronavirus 2 by RT PCR: POSITIVE — AB

## 2020-08-01 LAB — LACTIC ACID, PLASMA
Lactic Acid, Venous: 5 mmol/L (ref 0.5–1.9)
Lactic Acid, Venous: 3.3 mmol/L (ref 0.5–1.9)
Lactic Acid, Venous: 2.2 mmol/L (ref 0.5–1.9)

## 2020-08-01 LAB — GLUCOSE, CAPILLARY
Glucose-Capillary: 318 mg/dL — ABNORMAL HIGH (ref 70–99)
Glucose-Capillary: 236 mg/dL — ABNORMAL HIGH (ref 70–99)

## 2020-08-01 SURGERY — IRRIGATION AND DEBRIDEMENT EXTREMITY
Anesthesia: General | Site: Finger | Laterality: Left

## 2020-08-01 MED ORDER — MIDAZOLAM HCL 2 MG/2ML IJ SOLN
INTRAMUSCULAR | Status: AC
  Filled 2020-08-01: qty 2

## 2020-08-01 MED ORDER — SODIUM CHLORIDE 0.9 % IV SOLN
INTRAVENOUS | Status: DC | PRN
  Administered 2020-08-01: 250 mL via INTRAVENOUS

## 2020-08-01 MED ORDER — MORPHINE SULFATE (PF) 2 MG/ML IV SOLN
2.0000 mg | INTRAVENOUS | Status: DC | PRN
  Administered 2020-08-02 – 2020-08-03 (×2): 2 mg via INTRAVENOUS
  Filled 2020-08-01 (×2): qty 1

## 2020-08-01 MED ORDER — LACTATED RINGERS IV BOLUS (SEPSIS)
1000.0000 mL | Freq: Once | INTRAVENOUS | Status: AC
  Administered 2020-08-01: 1000 mL via INTRAVENOUS

## 2020-08-01 MED ORDER — FENTANYL CITRATE (PF) 100 MCG/2ML IJ SOLN
INTRAMUSCULAR | Status: AC
  Filled 2020-08-01: qty 2

## 2020-08-01 MED ORDER — HYDROCODONE-ACETAMINOPHEN 10-325 MG PO TABS
1.0000 | ORAL_TABLET | Freq: Four times a day (QID) | ORAL | Status: DC | PRN
  Administered 2020-08-01 – 2020-08-08 (×16): 1 via ORAL
  Filled 2020-08-01 (×16): qty 1

## 2020-08-01 MED ORDER — OXYCODONE HCL 5 MG PO TABS: 5.0000 mg | ORAL_TABLET | Freq: Once | ORAL | Status: DC | PRN

## 2020-08-01 MED ORDER — FENTANYL CITRATE (PF) 100 MCG/2ML IJ SOLN
INTRAMUSCULAR | Status: DC | PRN
  Administered 2020-08-01 (×2): 50 ug via INTRAVENOUS

## 2020-08-01 MED ORDER — MIDAZOLAM HCL 5 MG/5ML IJ SOLN
INTRAMUSCULAR | Status: DC | PRN
  Administered 2020-08-01: 2 mg via INTRAVENOUS

## 2020-08-01 MED ORDER — INSULIN ASPART 100 UNIT/ML ~~LOC~~ SOLN
0.0000 [IU] | Freq: Three times a day (TID) | SUBCUTANEOUS | Status: DC
  Administered 2020-08-02: 5 [IU] via SUBCUTANEOUS
  Administered 2020-08-02: 8 [IU] via SUBCUTANEOUS
  Administered 2020-08-02: 4 [IU] via SUBCUTANEOUS
  Administered 2020-08-03: 5 [IU] via SUBCUTANEOUS
  Administered 2020-08-03: 8 [IU] via SUBCUTANEOUS
  Administered 2020-08-03: 15 [IU] via SUBCUTANEOUS
  Administered 2020-08-04: 11 [IU] via SUBCUTANEOUS
  Administered 2020-08-04: 15 [IU] via SUBCUTANEOUS
  Administered 2020-08-04 – 2020-08-05 (×3): 8 [IU] via SUBCUTANEOUS
  Administered 2020-08-05: 11 [IU] via SUBCUTANEOUS
  Administered 2020-08-06: 3 [IU] via SUBCUTANEOUS
  Administered 2020-08-06 (×2): 5 [IU] via SUBCUTANEOUS

## 2020-08-01 MED ORDER — PROPOFOL 10 MG/ML IV BOLUS
INTRAVENOUS | Status: AC
  Filled 2020-08-01: qty 20

## 2020-08-01 MED ORDER — SODIUM CHLORIDE 0.9 % IR SOLN
Status: DC | PRN
  Administered 2020-08-01: 1000 mL

## 2020-08-01 MED ORDER — VANCOMYCIN HCL IN DEXTROSE 1-5 GM/200ML-% IV SOLN
1000.0000 mg | Freq: Once | INTRAVENOUS | Status: AC
  Administered 2020-08-01: 1000 mg via INTRAVENOUS
  Filled 2020-08-01: qty 200

## 2020-08-01 MED ORDER — VANCOMYCIN HCL 2000 MG/400ML IV SOLN: 2000.0000 mg | Freq: Once | INTRAVENOUS | Status: DC

## 2020-08-01 MED ORDER — VANCOMYCIN HCL 1500 MG/300ML IV SOLN
1500.0000 mg | INTRAVENOUS | Status: DC
  Administered 2020-08-02 – 2020-08-05 (×4): 1500 mg via INTRAVENOUS
  Filled 2020-08-01 (×5): qty 300

## 2020-08-01 MED ORDER — SODIUM CHLORIDE 0.9 % IV SOLN
2.0000 g | Freq: Once | INTRAVENOUS | Status: AC
  Administered 2020-08-01: 2 g via INTRAVENOUS
  Filled 2020-08-01: qty 2

## 2020-08-01 MED ORDER — LACTATED RINGERS IV SOLN: INTRAVENOUS | Status: AC

## 2020-08-01 MED ORDER — PROPOFOL 10 MG/ML IV BOLUS
INTRAVENOUS | Status: DC | PRN
  Administered 2020-08-01: 200 mg via INTRAVENOUS

## 2020-08-01 MED ORDER — LIDOCAINE 2% (20 MG/ML) 5 ML SYRINGE
INTRAMUSCULAR | Status: DC | PRN
  Administered 2020-08-01: 60 mg via INTRAVENOUS

## 2020-08-01 MED ORDER — OXYCODONE HCL 5 MG/5ML PO SOLN: 5.0000 mg | Freq: Once | ORAL | Status: DC | PRN

## 2020-08-01 MED ORDER — LIDOCAINE 2% (20 MG/ML) 5 ML SYRINGE
INTRAMUSCULAR | Status: AC
  Filled 2020-08-01: qty 5

## 2020-08-01 MED ORDER — ONDANSETRON HCL 4 MG/2ML IJ SOLN
INTRAMUSCULAR | Status: AC
  Filled 2020-08-01: qty 2

## 2020-08-01 MED ORDER — ONDANSETRON HCL 4 MG/2ML IJ SOLN
INTRAMUSCULAR | Status: DC | PRN
  Administered 2020-08-01: 4 mg via INTRAVENOUS

## 2020-08-01 MED ORDER — CHLORHEXIDINE GLUCONATE 0.12 % MT SOLN
15.0000 mL | Freq: Once | OROMUCOSAL | Status: AC
  Administered 2020-08-01: 15 mL via OROMUCOSAL

## 2020-08-01 MED ORDER — ONDANSETRON HCL 4 MG/2ML IJ SOLN: 4.0000 mg | Freq: Four times a day (QID) | INTRAMUSCULAR | Status: DC | PRN

## 2020-08-01 MED ORDER — VANCOMYCIN HCL IN DEXTROSE 1-5 GM/200ML-% IV SOLN
1000.0000 mg | INTRAVENOUS | Status: AC
Start: 2020-08-01 — End: 2020-08-01
  Administered 2020-08-01: 1000 mg via INTRAVENOUS
  Filled 2020-08-01: qty 200

## 2020-08-01 MED ORDER — BUPIVACAINE HCL (PF) 0.5 % IJ SOLN
INTRAMUSCULAR | Status: DC | PRN
  Administered 2020-08-01: 10 mL

## 2020-08-01 MED ORDER — FENTANYL CITRATE (PF) 100 MCG/2ML IJ SOLN: 25.0000 ug | INTRAMUSCULAR | Status: DC | PRN

## 2020-08-01 MED ORDER — INSULIN ASPART 100 UNIT/ML ~~LOC~~ SOLN
10.0000 [IU] | Freq: Once | SUBCUTANEOUS | Status: AC
  Administered 2020-08-01: 10 [IU] via SUBCUTANEOUS

## 2020-08-01 MED ORDER — SODIUM CHLORIDE 0.9 % IV SOLN
2.0000 g | Freq: Three times a day (TID) | INTRAVENOUS | Status: DC
  Administered 2020-08-01 – 2020-08-02 (×2): 2 g via INTRAVENOUS
  Filled 2020-08-01: qty 2
  Filled 2020-08-01: qty 1.67
  Filled 2020-08-01: qty 2

## 2020-08-01 MED ORDER — INSULIN ASPART 100 UNIT/ML ~~LOC~~ SOLN
0.0000 [IU] | Freq: Every day | SUBCUTANEOUS | Status: DC
Start: 2020-08-01 — End: 2020-08-07
  Administered 2020-08-01: 2 [IU] via SUBCUTANEOUS
  Administered 2020-08-02 – 2020-08-05 (×3): 5 [IU] via SUBCUTANEOUS
  Administered 2020-08-06: 3 [IU] via SUBCUTANEOUS

## 2020-08-01 MED ORDER — FENTANYL CITRATE (PF) 100 MCG/2ML IJ SOLN
12.5000 ug | Freq: Once | INTRAMUSCULAR | Status: AC
  Administered 2020-08-01: 12.5 ug via INTRAVENOUS
  Filled 2020-08-01: qty 2

## 2020-08-01 MED ORDER — BUPIVACAINE HCL (PF) 0.5 % IJ SOLN
INTRAMUSCULAR | Status: AC
  Filled 2020-08-01: qty 30

## 2020-08-01 MED ORDER — LACTATED RINGERS IV BOLUS (SEPSIS): 800.0000 mL | Freq: Once | INTRAVENOUS | Status: DC

## 2020-08-01 MED ORDER — ACETAMINOPHEN 500 MG PO TABS
1000.0000 mg | ORAL_TABLET | Freq: Once | ORAL | Status: AC
  Administered 2020-08-01: 1000 mg via ORAL
  Filled 2020-08-01: qty 2

## 2020-08-01 SURGICAL SUPPLY — 36 items
BAG ZIPLOCK 12X15 (MISCELLANEOUS) ×2 IMPLANT
BNDG COHESIVE 2X5 TAN STRL LF (GAUZE/BANDAGES/DRESSINGS) ×2 IMPLANT
BNDG COHESIVE 4X5 TAN STRL (GAUZE/BANDAGES/DRESSINGS) ×2 IMPLANT
BNDG CONFORM 2 STRL LF (GAUZE/BANDAGES/DRESSINGS) ×2 IMPLANT
BNDG GAUZE ELAST 4 BULKY (GAUZE/BANDAGES/DRESSINGS) ×2 IMPLANT
COVER MAYO STAND STRL (DRAPES) IMPLANT
COVER SURGICAL LIGHT HANDLE (MISCELLANEOUS) ×2 IMPLANT
COVER WAND RF STERILE (DRAPES) IMPLANT
CUFF TOURN SGL QUICK 18X4 (TOURNIQUET CUFF) IMPLANT
CUFF TOURN SGL QUICK 24 (TOURNIQUET CUFF) ×2
CUFF TRNQT CYL 24X4X16.5-23 (TOURNIQUET CUFF) ×1 IMPLANT
DRAPE U-SHAPE 47X51 STRL (DRAPES) IMPLANT
DRSG PAD ABDOMINAL 8X10 ST (GAUZE/BANDAGES/DRESSINGS) IMPLANT
ELECT REM PT RETURN 15FT ADLT (MISCELLANEOUS) IMPLANT
EVACUATOR 1/8 PVC DRAIN (DRAIN) IMPLANT
GAUZE SPONGE 4X4 12PLY STRL (GAUZE/BANDAGES/DRESSINGS) ×2 IMPLANT
GAUZE XEROFORM 1X8 LF (GAUZE/BANDAGES/DRESSINGS) ×2 IMPLANT
GLOVE SRG 8 PF TXTR STRL LF DI (GLOVE) ×1 IMPLANT
GLOVE SURG SYN 7.5  E (GLOVE) ×2
GLOVE SURG SYN 7.5 E (GLOVE) ×1 IMPLANT
GLOVE SURG UNDER POLY LF SZ8 (GLOVE) ×2
KIT BASIN OR (CUSTOM PROCEDURE TRAY) ×2 IMPLANT
KIT TURNOVER KIT A (KITS) ×2 IMPLANT
NEEDLE HYPO 22GX1.5 SAFETY (NEEDLE) ×2 IMPLANT
NS IRRIG 1000ML POUR BTL (IV SOLUTION) ×2 IMPLANT
PACK ORTHO EXTREMITY (CUSTOM PROCEDURE TRAY) ×2 IMPLANT
PENCIL SMOKE EVACUATOR (MISCELLANEOUS) IMPLANT
PROTECTOR NERVE ULNAR (MISCELLANEOUS) IMPLANT
SET IRRIG Y TYPE TUR BLADDER L (SET/KITS/TRAYS/PACK) IMPLANT
SPLINT FIBERGLASS 4X30 (CAST SUPPLIES) IMPLANT
STOCKINETTE 8 INCH (MISCELLANEOUS) ×2 IMPLANT
SUT MNCRL AB 3-0 PS2 18 (SUTURE) IMPLANT
SUT PROLENE 4 0 PS 2 18 (SUTURE) ×2 IMPLANT
SWAB COLLECTION DEVICE MRSA (MISCELLANEOUS) IMPLANT
SWAB CULTURE ESWAB REG 1ML (MISCELLANEOUS) IMPLANT
SYR CONTROL 10ML LL (SYRINGE) ×2 IMPLANT

## 2020-08-01 NOTE — Anesthesia Preprocedure Evaluation (Signed)
Anesthesia Evaluation  Patient identified by MRN, date of birth, ID band Patient awake    Reviewed: Allergy & Precautions, H&P , NPO status , Patient's Chart, lab work & pertinent test results  Airway Mallampati: II   Neck ROM: full    Dental   Pulmonary sleep apnea ,  COVID+   breath sounds clear to auscultation       Cardiovascular + DOE   Rhythm:regular Rate:Normal     Neuro/Psych  Headaches, PSYCHIATRIC DISORDERS Depression  Neuromuscular disease    GI/Hepatic   Endo/Other  diabetesMorbid obesity  Renal/GU      Musculoskeletal  (+) Arthritis , Osteomyelitis of left index finger   Abdominal   Peds  Hematology  (+) Blood dyscrasia, anemia ,   Anesthesia Other Findings   Reproductive/Obstetrics                             Anesthesia Physical Anesthesia Plan  ASA: II  Anesthesia Plan: General   Post-op Pain Management:    Induction: Intravenous  PONV Risk Score and Plan: 3 and Ondansetron, Dexamethasone, Midazolam and Treatment may vary due to age or medical condition  Airway Management Planned: LMA  Additional Equipment:   Intra-op Plan:   Post-operative Plan: Extubation in OR  Informed Consent: I have reviewed the patients History and Physical, chart, labs and discussed the procedure including the risks, benefits and alternatives for the proposed anesthesia with the patient or authorized representative who has indicated his/her understanding and acceptance.     Dental advisory given  Plan Discussed with: CRNA, Anesthesiologist and Surgeon  Anesthesia Plan Comments:         Anesthesia Quick Evaluation
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
## 2020-08-01 NOTE — Anesthesia Procedure Notes (Signed)
Procedure Name: LMA Insertion Date/Time: 08/01/2020 6:48 PM Performed by: Yolonda Kida, CRNA Pre-anesthesia Checklist: Patient identified, Emergency Drugs available, Suction available and Patient being monitored Patient Re-evaluated:Patient Re-evaluated prior to induction Oxygen Delivery Method: Circle system utilized Preoxygenation: Pre-oxygenation with 100% oxygen Induction Type: IV induction LMA: LMA inserted LMA Size: 4.0 Number of attempts: 1 Placement Confirmation: positive ETCO2 and breath sounds checked- equal and bilateral Tube secured with: Tape Dental Injury: Teeth and Oropharynx as per pre-operative assessment
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
## 2020-08-01 NOTE — ED Notes (Signed)
Lab called with positive covid results. Called [name] at [facility] pre-op and notified her of the patients results.

## 2020-08-01 NOTE — ED Notes (Signed)
Portable Xray at bedside.

## 2020-08-01 NOTE — Discharge Instructions (Addendum)
Osteomyelitis, Adult  Bone infections, also called osteomyelitis,occur when bacteria or other germs get inside a bone. This can happen if you have an infection in another part of your body that spreads through your blood. It can also happen if you have a wound or a broken bone (fracture) that breaks the skin. A wound or a fracture can allow germs from your skin or from outside of your body to spread to your bone. Bone infections need to be treated quickly to:  Prevent bone damage.  Prevent the infection from spreading to other areas of your body. What are the causes? Most bone infections are caused by bacteria. The most common bacteria is one found on the skin (staphylococcus). Bone infections can also be caused by other germs, such as viruses and funguses. What increases the risk? You are more likely to develop this condition if:  You recently had surgery, especially bone or joint surgery.  You have had an injury, such as stepping on a nail or having a fracture that exposes bones through the skin.  You have a long-term (chronic) disease, such as: ? Diabetes. ? HIV (human immunodeficiency virus). ? Rheumatoid arthritis. ? Sickle cell anemia. ? Kidney disease that requires dialysis.  You have a condition that affects your body's defense system (immune system) or you take medicines that block or weaken the immune system.  You have a condition that reduces your blood flow.  You have an artificial joint.  You have had a joint or bone repaired with plates or screws.  You use IV drugs. What are the signs or symptoms? Symptoms vary depending on the type and location of your infection. Common symptoms of bone infections include:  Fever and chills.  Skin redness and warmth.  Swelling.  Pain and stiffness.  Drainage of fluid or pus near the infection. How is this diagnosed? This condition may be diagnosed based on:  Your symptoms and medical history.  A physical  exam.  Tests, such as: ? A sample of tissue, fluid, or blood taken to be examined under a microscope. ? Pus or discharge swabbed from a wound for testing. This is to identify the type of germs and to determine what type of medicine will kill them. ? Blood tests.  Imaging studies, including X-rays, MRI, CT scan, bone scan, or ultrasound. How is this treated? Treatment for this condition depends on the cause and type of infection. Antibiotic medicines are usually the first treatment for a bone infection. This may be done in a hospital at first. You may have to continue IV antibiotics at home or take antibiotics by mouth for several weeks after that. Other treatments may include surgery to:  Remove dead or dying tissue from a bone.  Remove an infected artificial joint.  Remove infected plates or screws that were used to repair a broken bone. Follow these instructions at home: Medicines  Take over-the-counter and prescription medicines as told by your health care provider. Finish all antibiotic medicine even if you start to feel better.  Follow instructions from your health care provider about how to take IV antibiotics at home. You may need to have a nurse come to your home to give you the IV antibiotics. Managing pain, stiffness, and swelling If directed, put ice on the affected area. To do this:  Put ice in a plastic bag.  Place a towel between your skin and the bag.  Leave the ice on for 20 minutes, 2-3 times a day.   General   instructions  Ask your health care provider if you have any restrictions on your activities.  Do not use any products that contain nicotine or tobacco, such as cigarettes, e-cigarettes, and chewing tobacco. If you need help quitting, ask your health care provider.  Keep all follow-up visits as told by your health care provider. This is important. How is this prevented?  Wash your hands often to stop the spread of germs. Wash your hands for at least 20  seconds with soap and water. If soap and water are not available, use hand sanitizer.  Keep any open areas, cuts, or wounds clean. Apply a clean bandage after cleaning the area.  Check wounds frequently for signs of infections. Signs of infection include redness, swelling, warmth, pus, or a bad smell.  Wear proper footwear to avoid injuries to the feet. Contact a health care provider if:  You develop a fever or chills.  You have redness, warmth, pain, or swelling that returns after treatment. Get help right away if:  You have rapid breathing or you have trouble breathing.  You have chest pain.  You cannot drink fluids or make urine.  The affected area swells, changes color, or turns blue.  You have numbness or severe pain in the affected area. These symptoms may represent a serious problem that is an emergency. Do not wait to see if the symptoms will go away. Get medical help right away. Call your local emergency services (911 in the [place] Do not drive yourself to the hospital. Summary  Bone infections, also called osteomyelitis,occur when bacteria or other germs get inside a bone.  You are more likely to get this type of infection if you have a condition that lowers your ability to fight infections. You are also likely to get this condition if you take medicines that block or weaken the immune system.  Most bone infections are caused by bacteria. They can also be caused by other germs, such as viruses and funguses.  Treatment for this condition usually starts with taking antibiotics. Further treatment depends on the cause and type of infection. This information is not intended to replace advice given to you by your health care provider. Make sure you discuss any questions you have with your health care provider. Document Revised: 06/29/2019 Document Reviewed: 06/29/2019 Elsevier Patient Education  2021 [employer]  Wound Care, Adult Taking care of your wound properly can  help to prevent pain, infection, and scarring. It can also help your wound heal more quickly. Follow instructions from your health care provider about how to care for your wound. Supplies needed:  Soap and water.  Wound cleanser.  Gauze.  If needed, a clean bandage (dressing) or other type of wound dressing material to cover or place in the wound. Follow your health care provider's instructions about what dressing supplies to use.  Cream or ointment to apply to the wound, if told by your health care provider. How to care for your wound Cleaning the wound Ask your health care provider how to clean the wound. This may include:  Using mild soap and water or a wound cleanser.  Using a clean gauze to pat the wound dry after cleaning it. Do not rub or scrub the wound. Dressing care  Wash your hands with soap and water for at least 20 seconds before and after you change the dressing. If soap and water are not available, use hand sanitizer.  Change your dressing as told by your health care provider. This may  include: ? Cleaning or rinsing out (irrigating) the wound. ? Placing a dressing over the wound or in the wound (packing). ? Covering the wound with an outer dressing.  Leave any stitches (sutures), skin glue, or adhesive strips in place. These skin closures may need to stay in place for 2 weeks or longer. If adhesive strip edges start to loosen and curl up, you may trim the loose edges. Do not remove adhesive strips completely unless your health care provider tells you to do that.  Ask your health care provider when you can leave the wound uncovered. Checking for infection Check your wound area every day for signs of infection. Check for:  More redness, swelling, or pain.  Fluid or blood.  Warmth.  Pus or a bad smell.   Follow these instructions at home Medicines  If you were prescribed an antibiotic medicine, cream, or ointment, take or apply it as told by your health care  provider. Do not stop using the antibiotic even if your condition improves.  If you were prescribed pain medicine, take it 30 minutes before you do any wound care or as told by your health care provider.  Take over-the-counter and prescription medicines only as told by your health care provider. Eating and drinking  Eat a diet that includes protein, vitamin A, vitamin C, and other nutrient-rich foods to help the wound heal. ? Foods rich in protein include meat, fish, eggs, dairy, beans, and nuts. ? Foods rich in vitamin A include carrots and dark green, leafy vegetables. ? Foods rich in vitamin C include citrus fruits, tomatoes, broccoli, and peppers.  Drink enough fluid to keep your urine pale yellow. General instructions  Do not take baths, swim, use a hot tub, or do anything that would put the wound underwater until your health care provider approves. Ask your health care provider if you may take showers. You may only be allowed to take sponge baths.  Do not scratch or pick at the wound. Keep it covered as told by your health care provider.  Return to your normal activities as told by your health care provider. Ask your health care provider what activities are safe for you.  Protect your wound from the sun when you are outside for the first 6 months, or for as long as told by your health care provider. Cover up the scar area or apply sunscreen that has an SPF of at least 30.  Do not use any products that contain nicotine or tobacco, such as cigarettes, e-cigarettes, and chewing tobacco. These may delay wound healing. If you need help quitting, ask your health care provider.  Keep all follow-up visits as told by your health care provider. This is important. Contact a health care provider if:  You received a tetanus shot and you have swelling, severe pain, redness, or bleeding at the injection site.  Your pain is not controlled with medicine.  You have any of these signs of  infection: ? More redness, swelling, or pain around the wound. ? Fluid or blood coming from the wound. ? Warmth coming from the wound. ? Pus or a bad smell coming from the wound. ? A fever or chills.  You are nauseous or you vomit.  You are dizzy. Get help right away if:  You have a red streak of skin near the area around your wound.  Your wound has been closed with staples, sutures, skin glue, or adhesive strips and it begins to open up and separate.  Your wound is bleeding, and the bleeding does not stop with gentle pressure.  You have a rash.  You faint.  You have trouble breathing. These symptoms may represent a serious problem that is an emergency. Do not wait to see if the symptoms will go away. Get medical help right away. Call your local emergency services (911 in the [place] Do not drive yourself to the hospital. Summary  Always wash your hands with soap and water for at least 20 seconds before and after changing your dressing.  Change your dressing as told by your health care provider.  To help with healing, eat foods that are rich in protein, vitamin A, vitamin C, and other nutrients.  Check your wound every day for signs of infection. Contact your health care provider if you suspect that your wound is infected. This information is not intended to replace advice given to you by your health care provider. Make sure you discuss any questions you have with your health care provider. Document Revised: 01/27/2019 Document Reviewed: 01/27/2019 Elsevier Patient Education  2021 [employer] Inc.    Person Under Monitoring Name: [name]  Location: [address] [place] [id number]   Infection Prevention Recommendations for Individuals Confirmed to have, or Being Evaluated for, 2019 Novel Coronavirus (COVID-19) Infection Who Receive Care at Home  Individuals who are confirmed to have, or are being evaluated for, COVID-19 should follow the prevention  steps below until a healthcare provider or local or state health department says they can return to normal activities.  Stay home except to get medical care You should restrict activities outside your home, except for getting medical care. Do not go to work, school, or public areas, and do not use public transportation or taxis.  Call ahead before visiting your doctor Before your medical appointment, call the healthcare provider and tell them that you have, or are being evaluated for, COVID-19 infection. This will help the healthcare provider's office take steps to keep other people from getting infected. Ask your healthcare provider to call the local or state health department.  Monitor your symptoms Seek prompt medical attention if your illness is worsening (e.g., difficulty breathing). Before going to your medical appointment, call the healthcare provider and tell them that you have, or are being evaluated for, COVID-19 infection. Ask your healthcare provider to call the local or state health department.  Wear a facemask You should wear a facemask that covers your nose and mouth when you are in the same room with other people and when you visit a healthcare provider. People who live with or visit you should also wear a facemask while they are in the same room with you.  Separate yourself from other people in your home As much as possible, you should stay in a different room from other people in your home. Also, you should use a separate bathroom, if available.  Avoid sharing household items You should not share dishes, drinking glasses, cups, eating utensils, towels, bedding, or other items with other people in your home. After using these items, you should wash them thoroughly with soap and water.  Cover your coughs and sneezes Cover your mouth and nose with a tissue when you cough or sneeze, or you can cough or sneeze into your sleeve. Throw used tissues in a lined trash can, and  immediately wash your hands with soap and water for at least 20 seconds or use an alcohol-based hand rub.  Wash your [employer]hands Wash your hands often  and thoroughly with soap and water for at least 20 seconds. You can use an alcohol-based hand sanitizer if soap and water are not available and if your hands are not visibly dirty. Avoid touching your eyes, nose, and mouth with unwashed hands.   Prevention Steps for Caregivers and Household Members of Individuals Confirmed to have, or Being Evaluated for, COVID-19 Infection Being Cared for in the Home  If you live with, or provide care at home for, a person confirmed to have, or being evaluated for, COVID-19 infection please follow these guidelines to prevent infection:  Follow healthcare provider's instructions Make sure that you understand and can help the patient follow any healthcare provider instructions for all care.  Provide for the patient's basic needs You should help the patient with basic needs in the home and provide support for getting groceries, prescriptions, and other personal needs.  Monitor the patient's symptoms If they are getting sicker, call his or her medical provider and tell them that the patient has, or is being evaluated for, COVID-19 infection. This will help the healthcare provider's office take steps to keep other people from getting infected. Ask the healthcare provider to call the local or state health department.  Limit the number of people who have contact with the patient  If possible, have only one caregiver for the patient.  Other household members should stay in another home or place of residence. If this is not possible, they should stay  in another room, or be separated from the patient as much as possible. Use a separate bathroom, if available.  Restrict visitors who do not have an essential need to be in the home.  Keep older adults, very young children, and other sick people away from the  patient Keep older adults, very young children, and those who have compromised immune systems or chronic health conditions away from the patient. This includes people with chronic heart, lung, or kidney conditions, diabetes, and cancer.  Ensure good ventilation Make sure that shared spaces in the home have good air flow, such as from an air conditioner or an opened window, weather permitting.  Wash your hands often  Wash your hands often and thoroughly with soap and water for at least 20 seconds. You can use an alcohol based hand sanitizer if soap and water are not available and if your hands are not visibly dirty.  Avoid touching your eyes, nose, and mouth with unwashed hands.  Use disposable paper towels to dry your hands. If not available, use dedicated cloth towels and replace them when they become wet.  Wear a facemask and gloves  Wear a disposable facemask at all times in the room and gloves when you touch or have contact with the patient's blood, body fluids, and/or secretions or excretions, such as sweat, saliva, sputum, nasal mucus, vomit, urine, or feces.  Ensure the mask fits over your nose and mouth tightly, and do not touch it during use.  Throw out disposable facemasks and gloves after using them. Do not reuse.  Wash your hands immediately after removing your facemask and gloves.  If your personal clothing becomes contaminated, carefully remove clothing and launder. Wash your hands after handling contaminated clothing.  Place all used disposable facemasks, gloves, and other waste in a lined container before disposing them with other household waste.  Remove gloves and wash your hands immediately after handling these items.  Do not share dishes, glasses, or other household items with the patient  Avoid sharing household items.  You should not share dishes, drinking glasses, cups, eating utensils, towels, bedding, or other items with a patient who is confirmed to have, or  being evaluated for, COVID-19 infection.  After the person uses these items, you should wash them thoroughly with soap and water.  Wash laundry thoroughly  Immediately remove and wash clothes or bedding that have blood, body fluids, and/or secretions or excretions, such as sweat, saliva, sputum, nasal mucus, vomit, urine, or feces, on them.  Wear gloves when handling laundry from the patient.  Read and follow directions on labels of laundry or clothing items and detergent. In general, wash and dry with the warmest temperatures recommended on the label.  Clean all areas the individual has used often  Clean all touchable surfaces, such as counters, tabletops, doorknobs, bathroom fixtures, toilets, phones, keyboards, tablets, and bedside tables, every day. Also, clean any surfaces that may have blood, body fluids, and/or secretions or excretions on them.  Wear gloves when cleaning surfaces the patient has come in contact with.  Use a diluted bleach solution (e.g., dilute bleach with 1 part bleach and 10 parts water) or a household disinfectant with a label that says EPA-registered for coronaviruses. To make a bleach solution at home, add 1 tablespoon of bleach to 1 quart (4 cups) of water. For a larger supply, add  cup of bleach to 1 gallon (16 cups) of water.  Read labels of cleaning products and follow recommendations provided on product labels. Labels contain instructions for safe and effective use of the cleaning product including precautions you should take when applying the product, such as wearing gloves or eye protection and making sure you have good ventilation during use of the product.  Remove gloves and wash hands immediately after cleaning.  Monitor yourself for signs and symptoms of illness Caregivers and household members are considered close contacts, should monitor their health, and will be asked to limit movement outside of the home to the extent possible. Follow the  monitoring steps for close contacts listed on the symptom monitoring form.   ? If you have additional questions, contact your local health department or call the epidemiologist on call at [phone] (available 24/7). ? This guidance is subject to change. For the most up-to-date guidance from CDC, please refer to their website: [employer] Diabetes Mellitus and Nutrition, Adult When you have diabetes, or diabetes mellitus, it is very important to have healthy eating habits because your blood sugar (glucose) levels are greatly affected by what you eat and drink. Eating healthy foods in the right amounts, at about the same times every day, can help you:  Control your blood glucose.  Lower your risk of heart disease.  Improve your blood pressure.  Reach or maintain a healthy weight. What can affect my meal plan? Every person with diabetes is different, and each person has different needs for a meal plan. Your health care provider may recommend that you work with a dietitian to make a meal plan that is best for you. Your meal plan may vary depending on factors such as:  The calories you need.  The medicines you take.  Your weight.  Your blood glucose, blood pressure, and cholesterol levels.  Your activity level.  Other health conditions you have, such as heart or kidney disease. How do carbohydrates affect me? Carbohydrates, also called carbs, affect your blood glucose level more than any other type of food. Eating carbs naturally raises the amount of glucose in your blood. Carb counting is a method  for keeping track of how many carbs you eat. Counting carbs is important to keep your blood glucose at a healthy level, especially if you use insulin or take certain oral diabetes medicines. It is important to know how many carbs you can safely have in each meal. This is different for every person. Your dietitian can help you  calculate how many carbs you should have at each meal and for each snack. How does alcohol affect me? Alcohol can cause a sudden decrease in blood glucose (hypoglycemia), especially if you use insulin or take certain oral diabetes medicines. Hypoglycemia can be a life-threatening condition. Symptoms of hypoglycemia, such as sleepiness, dizziness, and confusion, are similar to symptoms of having too much alcohol.  Do not drink alcohol if: ? Your health care provider tells you not to drink. ? You are pregnant, may be pregnant, or are planning to become pregnant.  If you drink alcohol: ? Do not drink on an empty stomach. ? Limit how much you use to:  0-1 drink a day for women.  0-2 drinks a day for men. ? Be aware of how much alcohol is in your drink. In the [place] one drink equals one 12 oz bottle of beer (355 mL), one 5 oz glass of wine (148 mL), or one 1 oz glass of hard liquor (44 mL). ? Keep yourself hydrated with water, diet soda, or unsweetened iced tea.  Keep in mind that regular soda, juice, and other mixers may contain a lot of sugar and must be counted as carbs. What are tips for following this plan? Reading food labels  Start by checking the serving size on the "Nutrition Facts" label of packaged foods and drinks. The amount of calories, carbs, fats, and other nutrients listed on the label is based on one serving of the item. Many items contain more than one serving per package.  Check the total grams (g) of carbs in one serving. You can calculate the number of servings of carbs in one serving by dividing the total carbs by 15. For example, if a food has 30 g of total carbs per serving, it would be equal to 2 servings of carbs.  Check the number of grams (g) of saturated fats and trans fats in one serving. Choose foods that have a low amount or none of these fats.  Check the number of milligrams (mg) of salt (sodium) in one serving. Most people should limit total sodium intake to  less than 2,300 mg per day.  Always check the nutrition information of foods labeled as "low-fat" or "nonfat." These foods may be higher in added sugar or refined carbs and should be avoided.  Talk to your dietitian to identify your daily goals for nutrients listed on the label. Shopping  Avoid buying canned, pre-made, or processed foods. These foods tend to be high in fat, sodium, and added sugar.  Shop around the outside edge of the grocery store. This is where you will most often find fresh fruits and vegetables, bulk grains, fresh meats, and fresh dairy. Cooking  Use low-heat cooking methods, such as baking, instead of high-heat cooking methods like deep frying.  Cook using healthy oils, such as olive, canola, or sunflower oil.  Avoid cooking with butter, cream, or high-fat meats. Meal planning  Eat meals and snacks regularly, preferably at the same times every day. Avoid going long periods of time without eating.  Eat foods that are high in fiber, such as fresh fruits, vegetables, beans, and  whole grains. Talk with your dietitian about how many servings of carbs you can eat at each meal.  Eat 4-6 oz (112-168 g) of lean protein each day, such as lean meat, chicken, fish, eggs, or tofu. One ounce (oz) of lean protein is equal to: ? 1 oz (28 g) of meat, chicken, or fish. ? 1 egg. ?  cup (62 g) of tofu.  Eat some foods each day that contain healthy fats, such as avocado, nuts, seeds, and fish.   What foods should I eat? Fruits Berries. Apples. Oranges. Peaches. Apricots. Plums. Grapes. Mango. Papaya. Pomegranate. Kiwi. Cherries. Vegetables Lettuce. Spinach. Leafy greens, including kale, chard, collard greens, and mustard greens. Beets. Cauliflower. Cabbage. Broccoli. Carrots. Green beans. Tomatoes. Peppers. Onions. Cucumbers. Brussels sprouts. Grains Whole grains, such as whole-wheat or whole-grain bread, crackers, tortillas, cereal, and pasta. Unsweetened oatmeal. Quinoa. Brown  or wild rice. Meats and other proteins Seafood. Poultry without skin. Lean cuts of poultry and beef. Tofu. Nuts. Seeds. Dairy Low-fat or fat-free dairy products such as milk, yogurt, and cheese. The items listed above may not be a complete list of foods and beverages you can eat. Contact a dietitian for more information. What foods should I avoid? Fruits Fruits canned with syrup. Vegetables Canned vegetables. Frozen vegetables with butter or cream sauce. Grains Refined white flour and flour products such as bread, pasta, snack foods, and cereals. Avoid all processed foods. Meats and other proteins Fatty cuts of meat. Poultry with skin. Breaded or fried meats. Processed meat. Avoid saturated fats. Dairy Full-fat yogurt, cheese, or milk. Beverages Sweetened drinks, such as soda or iced tea. The items listed above may not be a complete list of foods and beverages you should avoid. Contact a dietitian for more information. Questions to ask a health care provider  Do I need to meet with a diabetes educator?  Do I need to meet with a dietitian?  What number can I call if I have questions?  When are the best times to check my blood glucose? Where to find more information:  American Diabetes Association: diabetes.org  Academy of Nutrition and Dietetics: www.eatright.[employer] of Diabetes and Digestive and Kidney Diseases: CarFlippers.tn  Association of Diabetes Care and Education Specialists: www.diabeteseducator.org Summary  It is important to have healthy eating habits because your blood sugar (glucose) levels are greatly affected by what you eat and drink.  A healthy meal plan will help you control your blood glucose and maintain a healthy lifestyle.  Your health care provider may recommend that you work with a dietitian to make a meal plan that is best for you.  Keep in mind that carbohydrates (carbs) and alcohol have immediate effects on your blood glucose  levels. It is important to count carbs and to use alcohol carefully. This information is not intended to replace advice given to you by your health care provider. Make sure you discuss any questions you have with your health care provider. Document Revised: 03/21/2019 Document Reviewed: 03/21/2019 Elsevier Patient Education  2021 [employer].

## 2020-08-01 NOTE — Op Note (Addendum)
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 Roney Mans, III, MD who was present and scrubbed for the entire case   ASSISTANT SURGEON: None.   ANESTHESIA: General with post procedure digital block  SURGICAL PROCEDURES: Partial amputation left index finger with DIP joint disarticulation  SURGICAL INDICATIONS: Patient is a 55 year old female with a known history of bilateral upper and lower extremity neuropathy.  She has a history of bilateral hand as well as left lower extremity amputations due to multiple nonhealing wounds and osteomyelitis.  She over the past couple of days developed a wound to the tip of the left index finger.  This increased in pain and swelling and she was seen at the [facility].  Radiographs obtained there showed osteolysis around the distal phalanx with an open wound at the tip of the digit.  Based on these findings as well as her progressive pain, swelling and tracking erythema, I did recommend proceeding forward with irrigation debridement of the finger with partial amputation and she presents for that.  FINDING: Purulent material surrounding both palmar and dorsal aspects of the distal phalanx.  The distal phalanx was soft and obviously infected with osteomyelitis.  The distal phalanx was disarticulated from the DIP joint and successful shortening of the digit was performed after thorough irrigation debridement.  DESCRIPTION OF PROCEDURE: Patient was identified in the preop holding area where the risk benefits and alternatives of the procedure were discussed with the patient.  These risks include but are not limited to continued infection, bleeding, damage to surrounding structures including blood vessels and nerves, pain, stiffness, wound breakdown and need for additional procedures.  Informed consent was obtained at that time the patient's left index finger was  marked with a surgical marking pen.  She was then brought to the operative suite where time was performed identifying the correct patient operative site.  She was positioned supine on the operative table and induced under general anesthesia.  All bony prominences were well-padded.  Preoperative antibiotics had been administered in the ER.  Tourniquet was placed on the upper arm and the left hand was prepped with a Betadine paint and scrub and draped in usual sterile fashion.  The limb was exsanguinated by gravity and the tourniquet was inflated.  Mid lateral incisions were made on both the radial and ulnar aspects of the index finger.  This was first done radially.  Blunt dissection was carried down through the subcutaneous tissues TAM to the distal phalanx.  In doing so there was a blush of purulent material which was cultured for both aerobic and anaerobic bacteria.  The 2 incisions radially and ulnarly were connected at the tip of the digit to create a fishmouth incision.  There was abundant infectious material around the distal phalanx which was debrided.  The distal phalanx itself was necrotic in appearance and obviously infected.  Circumferential dissection was performed sharply with a 15 blade removing the distal phalanx in its entirety through the DIP joint.  The dead and necrotic skin at the tip of the digit was excised and the skin was contoured for later closure.  At this point the wound was thoroughly debrided sharply with scissors and a 15 blade as well as a rondure removing all necrotic and infected material through the wound.  The wound was then irrigated with 3 L of normal saline via cystoscopy tubing.  At the completion of this, there was no further infectious  material within the fingertip.  The palmar and dorsal skin flaps were then closed over top of the middle phalanx in a tension-free fashion with interrupted 4-0 Prolene sutures.  Half percent bupivacaine was injected the palm around radial  and ulnar neurovascular bundles to the index finger.  The tourniquet was released and patient had return of brisk capillary refill to all the fingertips including the index finger.  Xeroform, 4 x 4's and a well-padded soft dressing were applied.  The patient was awoken from her sedation and extubated in the operating without any complications.  She was taken the PACU in stable condition.  ESTIMATED BLOOD LOSS: 15 mL  TOURNIQUET TIME: Approximately 20 minutes  SPECIMENS: Aerobic and anaerobic cultures x1  POSTOPERATIVE PLAN: Patient will be taken to the floor for continued care under the hospitalist service.  We will monitor her cultures as well as her wound to ensure she is on appropriate antibiotics and her wounds are healing.  Once antibiotic plan has been determined, she can be discharged from hand surgery standpoint follow-up with me in approximate 1 week for wound check.  IMPLANTS: None  Debridement type: Excisional Debridement

## 2020-08-01 NOTE — Progress Notes (Signed)
Covid + patient. PACU RN recovering patient post-op in OR 7, then will transport straight to 5W room. Anesthesia monitor VS not displaying in Epic VS flowsheet, so being manually entered by PACU RN. Obtained patient blood sugar result of 217 (was 318 pre-op). Dr Chaney Malling notified, no new orders received.
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
## 2020-08-01 NOTE — Progress Notes (Signed)
Received critical lab result  lactic Acid-2.2. Notified to X.Blount NP.  NP said patient has not yet been evaluated by [facility] at this movement So paged Dr Cyndia Bent.Ching .

## 2020-08-01 NOTE — Progress Notes (Signed)
Pharmacy Antibiotic Note  [name] is a 55 y.o. female admitted on 08/01/2020 with known hx of bilateral upper and lower extremity neuropathy.  Pt developed left index finger distal phalanx osteomyelitis with open wound.  Pharmacy has been consulted for vancomycin and cefepime dosing.    Plan: Vancomycin 2gm x 1 (given earlier) then 1500 q24h (AUC 481.5, Scr 1.0) Cefepime 2gm IV q8h Follow renal function, cultures and clinical course   Height: 5\' 4"  (162.6 cm) Weight: 123.4 kg (272 lb) IBW/kg (Calculated) : 54.7  Temp (24hrs), Avg:100.1 F (37.8 C), Min:98.4 F (36.9 C), Max:101.6 F (38.7 C)  Recent Labs  Lab 08/01/20 1503 08/01/20 1658  WBC 8.8  --   CREATININE 1.00  --   LATICACIDVEN 5.0* 3.3*    Estimated Creatinine Clearance: 83.5 mL/min (by C-G formula based on SCr of 1 mg/dL).    Allergies  Allergen Reactions  . Rocephin [Ceftriaxone] Shortness Of Breath and Swelling    Tolerated course of Rocephin 6/19 and multiple courses of Ancef/Keflex as well as Cefepime  . Daptomycin Other (See Comments)    Chills and lower back pain; Patient states had issues due to this medication and Vancomycin ran at the same time; Patient states related to infected PICC Line   . Vancomycin Other (See Comments)    Drives creatinine level(s) "through the roof" and they didn't return to normal for approx 14 days Reaction happened in march, has since had vancomycin without problem. Patient states was related to infected PICC Line; after PICC changed no problems.    Antimicrobials this admission: 4/7 vanc >> 4/7 cefepime >> Dose adjustments this admission:   Microbiology results: 4/7 BCx:  4/7 wound :  Thank you for allowing pharmacy to be a part of this patient's care.  6/7 RPh 08/01/2020, 10:21 PM

## 2020-08-01 NOTE — ED Triage Notes (Addendum)
Ptarrives pov with spouse, reports burning left index finger 2 weeks pta. Pt self treating at home, not improving. Redness, and swelling noted. Pt endorses multiple prior amputations, L leg, 2 fingers on R hand

## 2020-08-01 NOTE — ED Notes (Signed)
ED Provider at bedside. 

## 2020-08-01 NOTE — Transfer of Care (Signed)
Immediate Anesthesia Transfer of Care Note  Patient: [name]  Procedure(s) Performed: IRRIGATION AND DEBRIDEMENT EXTREMITY,PARTIAL AMPUTATION LEFT INDEX FINGER (Left Finger)  Patient Location: OR; recovery by PACU RN with circulator RN  Anesthesia Type:General  Level of Consciousness: awake, drowsy and patient cooperative  Airway & Oxygen Therapy: Patient Spontanous Breathing and Patient connected to face mask oxygen  Post-op Assessment: Report given to RN and Post -op Vital signs reviewed and stable  Post vital signs: Reviewed and stable  Last Vitals:  Vitals Value Taken Time  BP 160/98 1945  Temp    Pulse 104   Resp    SpO2 94%     Last Pain:  Vitals:   08/01/20 1816  TempSrc:   PainSc: 5       Patients Stated Pain Goal: 3 (08/01/20 1816)  Complications: No complications documented.

## 2020-08-02 ENCOUNTER — Encounter (HOSPITAL_COMMUNITY): Payer: Self-pay | Admitting: Orthopaedic Surgery

## 2020-08-02 DIAGNOSIS — E1143 Type 2 diabetes mellitus with diabetic autonomic (poly)neuropathy: Secondary | ICD-10-CM

## 2020-08-02 DIAGNOSIS — U071 COVID-19: Secondary | ICD-10-CM | POA: Diagnosis present

## 2020-08-02 DIAGNOSIS — E1165 Type 2 diabetes mellitus with hyperglycemia: Secondary | ICD-10-CM

## 2020-08-02 LAB — HEMOGLOBIN A1C
Hgb A1c MFr Bld: 7.7 % — ABNORMAL HIGH (ref 4.8–5.6)
Mean Plasma Glucose: 174.29 mg/dL

## 2020-08-02 LAB — CBC
HCT: 30.4 % — ABNORMAL LOW (ref 36.0–46.0)
Hemoglobin: 9.8 g/dL — ABNORMAL LOW (ref 12.0–15.0)
MCH: 30.6 pg (ref 26.0–34.0)
MCHC: 32.2 g/dL (ref 30.0–36.0)
MCV: 95 fL (ref 80.0–100.0)
Platelets: 202 10*3/uL (ref 150–400)
RBC: 3.2 MIL/uL — ABNORMAL LOW (ref 3.87–5.11)
RDW: 14.8 % (ref 11.5–15.5)
WBC: 9.2 10*3/uL (ref 4.0–10.5)
nRBC: 0 % (ref 0.0–0.2)

## 2020-08-02 LAB — GLUCOSE, CAPILLARY
Glucose-Capillary: 270 mg/dL — ABNORMAL HIGH (ref 70–99)
Glucose-Capillary: 230 mg/dL — ABNORMAL HIGH (ref 70–99)
Glucose-Capillary: 296 mg/dL — ABNORMAL HIGH (ref 70–99)
Glucose-Capillary: 305 mg/dL — ABNORMAL HIGH (ref 70–99)
Glucose-Capillary: 379 mg/dL — ABNORMAL HIGH (ref 70–99)

## 2020-08-02 LAB — BASIC METABOLIC PANEL
Anion gap: 11 (ref 5–15)
BUN: 13 mg/dL (ref 6–20)
CO2: 21 mmol/L — ABNORMAL LOW (ref 22–32)
Calcium: 8.8 mg/dL — ABNORMAL LOW (ref 8.9–10.3)
Chloride: 102 mmol/L (ref 98–111)
Creatinine, Ser: 0.97 mg/dL (ref 0.44–1.00)
GFR, Estimated: >60 mL/min (ref >60)
Glucose, Bld: 311 mg/dL — ABNORMAL HIGH (ref 70–99)
Potassium: 4.2 mmol/L (ref 3.5–5.1)
Sodium: 134 mmol/L — ABNORMAL LOW (ref 135–145)

## 2020-08-02 LAB — LACTIC ACID, PLASMA: Lactic Acid, Venous: 2.5 mmol/L (ref 0.5–1.9)

## 2020-08-02 MED ORDER — GABAPENTIN 300 MG PO CAPS
300.0000 mg | ORAL_CAPSULE | Freq: Two times a day (BID) | ORAL | Status: DC
  Administered 2020-08-02 – 2020-08-09 (×15): 300 mg via ORAL
  Filled 2020-08-02 (×15): qty 1

## 2020-08-02 MED ORDER — ACETAMINOPHEN 325 MG PO TABS
650.0000 mg | ORAL_TABLET | Freq: Four times a day (QID) | ORAL | Status: DC | PRN
  Administered 2020-08-02 – 2020-08-03 (×5): 650 mg via ORAL
  Filled 2020-08-02 (×5): qty 2

## 2020-08-02 MED ORDER — DULOXETINE HCL 60 MG PO CPEP
60.0000 mg | ORAL_CAPSULE | Freq: Every day | ORAL | Status: DC
  Administered 2020-08-02 – 2020-08-09 (×8): 60 mg via ORAL
  Filled 2020-08-02 (×8): qty 1

## 2020-08-02 MED ORDER — OXYBUTYNIN CHLORIDE ER 5 MG PO TB24
10.0000 mg | ORAL_TABLET | Freq: Every day | ORAL | Status: DC
  Administered 2020-08-02 – 2020-08-09 (×8): 10 mg via ORAL
  Filled 2020-08-02 (×8): qty 2

## 2020-08-02 NOTE — Progress Notes (Signed)
Pt temp 103.2, tylenol and ice packs given. MD aware. Will continue to monitor.

## 2020-08-02 NOTE — Progress Notes (Signed)
   08/02/20 0850  Assess: MEWS Score  Temp (!) 103.5 F (39.7 C)  BP (!) 153/62  Pulse Rate (!) 103  ECG Heart Rate (!) 107  Resp (!) 30  SpO2 94 %  O2 Device Nasal Cannula  Patient Activity (if Appropriate) In bed  O2 Flow Rate (L/min) 2.5 L/min  Assess: MEWS Score  MEWS Temp 2  MEWS Systolic 0  MEWS Pulse 1  MEWS RR 2  MEWS LOC 0  MEWS Score 5  MEWS Score Color Red  Assess: if the MEWS score is Yellow or Red  Were vital signs taken at a resting state? Yes  Focused Assessment No change from prior assessment  Early Detection of Sepsis Score *See Row Information* Low  MEWS guidelines implemented *See Row Information* No, previously red, continue vital signs every 4 hours  Notify: Charge Nurse/RN  Name of Charge Nurse/RN Notified Kasia RN  Date Charge Nurse/RN Notified 08/02/20  Time Charge Nurse/RN Notified 0900  Notify: Provider  Provider Name/Title Dr. Natale Milch  Date Provider Notified 08/02/20  Time Provider Notified 0900  Notification Type Page  Notification Reason Change in status  Provider response No new orders

## 2020-08-02 NOTE — Progress Notes (Signed)
   08/02/20 0046  Assess: MEWS Score  Temp (!) 102.6 F (39.2 C)  BP (!) 141/68  Pulse Rate (!) 123  Resp 20  SpO2 92 %  Assess: MEWS Score  MEWS Temp 2  MEWS Systolic 0  MEWS Pulse 2  MEWS RR 0  MEWS LOC 0  MEWS Score 4  MEWS Score Color Red  Assess: if the MEWS score is Yellow or Red  Were vital signs taken at a resting state? Yes  Focused Assessment Change from prior assessment (see assessment flowsheet)  Early Detection of Sepsis Score *See Row Information* Low  MEWS guidelines implemented *See Row Information* Yes  Treat  MEWS Interventions Administered prn meds/treatments  Take Vital Signs  Increase Vital Sign Frequency  Red: Q 1hr X 4 then Q 4hr X 4, if remains red, continue Q 4hrs  Escalate  MEWS: Escalate Red: discuss with charge nurse/RN and provider, consider discussing with RRT  Notify: Charge Nurse/RN  Name of Charge Nurse/RN Notified Tom RN  Date Charge Nurse/RN Notified 08/02/20  Time Charge Nurse/RN Notified 0054  Notify: Provider  Provider Name/Title Tu.[place] T  Date Provider Notified 08/02/20  Time Provider Notified 0050  Notification Type Page  Notification Reason Change in status  Provider response See new orders  Date of Provider Response 08/02/20  Time of Provider Response 2052  Document  Patient Outcome Stabilized after interventions  Progress note created (see row info) Yes

## 2020-08-02 NOTE — Progress Notes (Signed)
Inpatient Diabetes Program Recommendations  AACE/ADA: New Consensus Statement on Inpatient Glycemic Control (2015)  Target Ranges:  Prepandial:   less than 140 mg/dL      Peak postprandial:   less than 180 mg/dL (1-2 hours)      Critically ill patients:  140 - 180 mg/dL   Lab Results  Component Value Date   GLUCAP 230 (H) 08/02/2020   HGBA1C 7.7 (H) 08/01/2020    Review of Glycemic Control Results for [name], [name] (MRN [id number]) as of 08/02/2020 11:07  Ref. Range 08/01/2020 18:12 08/01/2020 19:55 08/01/2020 22:54 08/02/2020 07:35 08/02/2020 11:35  Glucose-Capillary Latest Ref Range: 70 - 99 mg/dL 318 (H) 270 (H) 236 (H) 230 (H) 296 (H)   Diabetes history: DM2 Outpatient Diabetes medications: Glucotrol Xl 10 mg q am + Sita-Met 50-1 gm  Current orders for Inpatient glycemic control: Novolog 0-15 units tid + hs 0-5 units  Inpatient Diabetes Program Recommendations:   While home medications on hold, please consider: -Lantus 25 units qd (0.3 units/kg x 123.4 kg) Secure chat sent to Dr. Avon Gully.  Thank you, Nani Gasser. Nahome Bublitz, RN, MSN, CDE  Diabetes Coordinator Inpatient Glycemic Control Team Team Pager [phone] (8am-5pm) 08/02/2020 11:47 AM

## 2020-08-02 NOTE — Progress Notes (Addendum)
PROGRESS NOTE    [name]  [id number] DOB: 11/25/1965 DOA: 08/01/2020 PCP: Eartha InchBadger, Michael C, MD   Brief Narrative:  [name] Braverlexandra R Thalmann is a 55 y.o. 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 Patient reports neuropathy of her hands and frequently gets injuries and burns to her fingers with multiple resultant infections. In the ED, she was febrile up to 101.6, mildly tachycardic with lactic acid of 5. Left hand x-ray shows osteolytic changes of the distal phalanx of the second digit.  She was also incidentally found to be Covid positive but denies any respiratory symptoms including shortness of breath, cough or chest pain.  No nausea, vomiting, diarrhea or abdominal pain. Patient was transferred to [facility] and taken immediately to the OR by hand surgeon Dr. Abbey ChattersJohn Creighton for debridement with Partial amputation of the left index finger with DIP joint disarticulation.  Assessment & Plan:   Principal Problem:   Osteomyelitis (HCC) Active Problems:   Controlled type 2 diabetes with neuropathy (HCC)   Obstructive sleep apnea syndrome, severe   Morbid (severe) obesity due to excess calories (HCC)   Sepsis due to cellulitis (HCC)   COVID-19 virus infection   Partial amputation of the left index finger secondary to acute osteomyelitis, meeting severe sepsis criteria, POA - Ortho Dr. Roney Mansreighton following - appreciate insight and recommendations - Status post partial amputation 08/01/2020 - Pain currently well controlled on PRN oxycodone for moderate pain, morphine for severe pain  -Preliminary cultures remarkable for gram-positive cocci, transition to vancomycin  only per discussion with pharmacy until cultures result, discontinue cefepime - Patient presented with fever, tachycardia, tachypnea and lactic acidosis of 5 -continue IV fluids until p.o. intake improves  Uncontrolled Type 2 diabetes with neuropathy -Continues to have hyperglycemia -A1c remains uncontrolled at 7.7 -Lengthy discussion about need for proper dietary compliance and medication compliance given worsening neuropathy and multiple episodes of osteomyelitis and infection. -Continue moderate sliding scale, hypoglycemic protocol, will adjust insulin accordingly.  Incidental COVID positive status, POA Cannot rule out acute viral infection although markedly unlikely -Patient remains asymptomatic, no known sick contacts, has been vaccinated x2, history of Covid infection October 2021, no booster  -Contact precautions per protocol but given patient's asymptomatic nature, unimpressive labs and imaging will not acutely treat patient.  Certainly if patient becomes symptomatic would consider steroids and Remdesivir but again less likely given above.  OSA Continue home CPAP unit  Morbid obesity BMI greater than 40 I DVT prophylaxis: SCDs only, early ambulation Code Status: Full Family Communication: None present  Status is: Inpatient  Dispo: The patient is from: Home              Anticipated d/c is to: Home              Anticipated d/c date is: 48 to 72 hours pending clinical course and cultures              Patient currently not medically stable for discharge given her recent surgical procedure, need for IV antibiotics and close monitoring.  Consultants:   Orthopedic surgery, Dr. Roney Mansreighton  Procedures:   Partial amputation left index finger 08/01/2020  Antimicrobials:  Cefepime 08/01/2020 discontinued 08/02/2020 Vancomycin 08/01/2020, ongoing  Subjective: No acute issues or events overnight, pain well controlled, patient indicates some edema postoperatively in the left  hand but improving with elevation -she also reports occasional episode of fever well controlled with acetaminophen.  Otherwise denies nausea vomiting diarrhea constipation headache.  Objective: Vitals:   08/02/20 0144 08/02/20 0243 08/02/20 0350 08/02/20 0451  BP: (!) 136/55 (!) 148/65 (!) 124/55 129/61  Pulse: (!) 117 (!) 115 (!) 108 (!) 106  Resp: (!) 24 (!) 22 (!) 22 (!) 23  Temp: (!) 102.1 F (38.9 C) 100.2 F (37.9 C) (!) 100.4 F (38 C) 100.1 F (37.8 C)  TempSrc: Oral Oral Oral Oral  SpO2: 91% 94% 95% (!) 82%  Weight:      Height:        Intake/Output Summary (Last 24 hours) at 08/02/2020 0841 Last data filed at 08/02/2020 0600 Gross per 24 hour  Intake 2645.39 ml  Output 2 ml  Net 2643.39 ml   Filed Weights   08/01/20 1434  Weight: 123.4 kg    Examination:  General exam: Appears calm and comfortable  Respiratory system: Clear to auscultation. Respiratory effort normal. Cardiovascular system: S1 & S2 heard, RRR. No JVD, murmurs, rubs, gallops or clicks. No pedal edema. Gastrointestinal system: Abdomen is nondistended, soft and nontender. No organomegaly or masses felt. Normal bowel sounds heard. Central nervous system: Alert and oriented. No focal neurological deficits. Extremities: Symmetric 5 x 5 power. Skin: No rashes, lesions or ulcers Psychiatry: Judgement and insight appear normal. Mood & affect appropriate.     Data Reviewed: I have personally reviewed following labs and imaging studies  CBC: Recent Labs  Lab 08/01/20 1503 08/02/20 0046  WBC 8.8 9.2  NEUTROABS 7.1  --   HGB 10.4* 9.8*  HCT 32.0* 30.4*  MCV 91.2 95.0  PLT 208 202   Basic Metabolic Panel: Recent Labs  Lab 08/01/20 1503 08/02/20 0046  NA 133* 134*  K 4.1 4.2  CL 98 102  CO2 22 21*  GLUCOSE 452* 311*  BUN 16 13  CREATININE 1.00 0.97  CALCIUM 9.5 8.8*   GFR: Estimated Creatinine Clearance: 86 mL/min (by C-G formula based on SCr of 0.97 mg/dL). Liver Function  Tests: Recent Labs  Lab 08/01/20 1503  AST 17  ALT 19  ALKPHOS 87  BILITOT 0.6  PROT 6.9  ALBUMIN 3.7   No results for input(s): LIPASE, AMYLASE in the last 168 hours. No results for input(s): AMMONIA in the last 168 hours. Coagulation Profile: No results for input(s): INR, PROTIME in the last 168 hours. Cardiac Enzymes: No results for input(s): CKTOTAL, CKMB, CKMBINDEX, TROPONINI in the last 168 hours. BNP (last 3 results) No results for input(s): PROBNP in the last 8760 hours. HbA1C: Recent Labs    08/01/20 2227  HGBA1C 7.7*   CBG: Recent Labs  Lab 08/01/20 1812 08/01/20 1955 08/01/20 2254 08/02/20 0735  GLUCAP 318* 270* 236* 230*   Lipid Profile: No results for input(s): CHOL, HDL, LDLCALC, TRIG, CHOLHDL, LDLDIRECT in the last 72 hours. Thyroid Function Tests: No results for input(s): TSH, T4TOTAL, FREET4, T3FREE, THYROIDAB in the last 72 hours. Anemia Panel: No results for input(s): VITAMINB12, FOLATE, FERRITIN, TIBC, IRON, RETICCTPCT in the last 72 hours. Sepsis Labs: Recent Labs  Lab 08/01/20 1503 08/01/20 1658 08/01/20 2230 08/02/20 0046  LATICACIDVEN 5.0* 3.3* 2.2* 2.5*    Recent Results (from the past 240 hour(s))  Blood culture (routine x 2)     Status: None (Preliminary result)   Collection Time:  08/01/20  2:48 PM   Specimen: BLOOD  Result Value Ref Range Status   Specimen Description BLOOD RIGHT ANTECUBITAL  Final   Special Requests   Final    BOTTLES DRAWN AEROBIC AND ANAEROBIC Blood Culture adequate volume Performed at [facility], [address], [place], [place] [address]    Culture PENDING  Incomplete   Report Status PENDING  Incomplete  Blood culture (routine x 2)     Status: None (Preliminary result)   Collection Time: 08/01/20  2:54 PM   Specimen: BLOOD  Result Value Ref Range Status   Specimen Description BLOOD LEFT ANTECUBITAL  Final   Special Requests   Final    BOTTLES DRAWN AEROBIC AND ANAEROBIC Blood Culture adequate  volume Performed at [facility], [address], [place], [place] [address]    Culture PENDING  Incomplete   Report Status PENDING  Incomplete  Resp Panel by RT-PCR (Flu A&B, Covid) Nasopharyngeal Swab     Status: Abnormal   Collection Time: 08/01/20  4:06 PM   Specimen: Nasopharyngeal Swab; Nasopharyngeal(NP) swabs in vial transport medium  Result Value Ref Range Status   SARS Coronavirus 2 by RT PCR POSITIVE (A) NEGATIVE Final    Comment: RESULT CALLED TO, READ BACK BY AND VERIFIED WITH: Nelia Shi RN [address] (NOTE) SARS-CoV-2 target nucleic acids are DETECTED.  The SARS-CoV-2 RNA is generally detectable in upper respiratory specimens during the acute phase of infection. Positive results are indicative of the presence of the identified virus, but do not rule out bacterial infection or co-infection with other pathogens not detected by the test. Clinical correlation with patient history and other diagnostic information is necessary to determine patient infection status. The expected result is Negative.  Fact Sheet for Patients: BloggerCourse.com  Fact Sheet for Healthcare Providers: SeriousBroker.it  This test is not yet approved or cleared by the [place] FDA and  has been authorized for detection and/or diagnosis of SARS-CoV-2 by FDA under an Emergency Use Authorization (EUA).  This EUA will remain in effect (meaning this test can be used) for  the duration of  the COVID-19 declaration under Section 564(b)(1) of the Act, 21 U.S.C. section 360bbb-3(b)(1), unless the authorization is terminated or revoked sooner.     Influenza A by PCR NEGATIVE NEGATIVE Final   Influenza B by PCR NEGATIVE NEGATIVE Final    Comment: (NOTE) The Xpert Xpress SARS-CoV-2/FLU/RSV plus assay is intended as an aid in the diagnosis of influenza from Nasopharyngeal swab specimens and should not be used as a sole basis for treatment. Nasal  washings and aspirates are unacceptable for Xpert Xpress SARS-CoV-2/FLU/RSV testing.  Fact Sheet for Patients: BloggerCourse.com  Fact Sheet for Healthcare Providers: SeriousBroker.it  This test is not yet approved or cleared by the [place] FDA and has been authorized for detection and/or diagnosis of SARS-CoV-2 by FDA under an Emergency Use Authorization (EUA). This EUA will remain in effect (meaning this test can be used) for the duration of the COVID-19 declaration under Section 564(b)(1) of the Act, 21 U.S.C. section 360bbb-3(b)(1), unless the authorization is terminated or revoked.  Performed at [facility]   Aerobic/Anaerobic Culture w Gram Stain (surgical/deep wound)     Status: None (Preliminary result)   Collection Time: 08/01/20  7:13 PM   Specimen: Joint, Finger  Result Value Ref Range Status   Specimen Description   Final    FINGER LEFT INDEX Performed at [facility], [address] [place], [place] [address]    Special Requests   Final    NONE Performed at [facility], [address], [place], [place] [address]    Gram Stain   Final    NO WBC SEEN MODERATE GRAM POSITIVE COCCI Performed at [facility], [address] [place], [place] [address]    Culture PENDING  Incomplete   Report Status PENDING  Incomplete         Radiology Studies: DG Finger Index Left  Result Date: 08/01/2020 CLINICAL DATA:  Pain and redness EXAM: LEFT SECOND FINGER 2+V COMPARISON:  None. FINDINGS: Frontal, oblique, and lateral views obtained. There is diffuse soft tissue swelling. There is loss of cortex along the distal most aspect of the second distal phalanx with a small focus of calcification lateral to the distal aspect of the second distal phalanx. No fracture or dislocation. No erosive change. No radiopaque foreign body or soft tissue air. IMPRESSION: Diffuse  soft tissue swelling second digit. Loss of cortex along the distal most aspect of the second distal phalanx concerning for focal bony destruction. Smiled fragmentation in this area noted. No other findings suggesting bony destruction. No acute fracture or dislocation. Electronically Signed   By: Bretta Bang III M.D.   On: 08/01/2020 15:24   Scheduled Meds: . DULoxetine  60 mg Oral Daily  . gabapentin  300 mg Oral BID  . insulin aspart  0-15 Units Subcutaneous TID WC  . insulin aspart  0-5 Units Subcutaneous QHS  . oxybutynin  10 mg Oral Daily   Continuous Infusions: . sodium chloride 250 mL (08/01/20 1546)  . ceFEPime (MAXIPIME) IV 2 g (08/01/20 2307)  . lactated ringers    . lactated ringers 150 mL/hr at 08/02/20 0610  . vancomycin       LOS: 1 day   Time spent:  Azucena Fallen, DO [facility]  If 7PM-7AM, please contact night-coverage [email or web address]  08/02/2020, 8:41 AM

## 2020-08-02 NOTE — Anesthesia Postprocedure Evaluation (Signed)
Anesthesia Post Note  Patient: [name]  Procedure(s) Performed: IRRIGATION AND DEBRIDEMENT EXTREMITY,PARTIAL AMPUTATION LEFT INDEX FINGER (Left Finger)     Patient location during evaluation: PACU Anesthesia Type: General Level of consciousness: awake and alert Pain management: pain level controlled Vital Signs Assessment: post-procedure vital signs reviewed and stable Respiratory status: spontaneous breathing, nonlabored ventilation, respiratory function stable and patient connected to nasal cannula oxygen Cardiovascular status: blood pressure returned to baseline and stable Postop Assessment: no apparent nausea or vomiting Anesthetic complications: no   No complications documented.  Last Vitals:  Vitals:   08/01/20 2046 08/01/20 2249  BP: (!) 145/68 (!) 171/72  Pulse: 96 (!) 105  Resp: 18 20  Temp: 37.9 C 37.9 C  SpO2: 97% 95%    Last Pain:  Vitals:   08/01/20 2249  TempSrc: Oral  PainSc:                  Imre Vecchione S

## 2020-08-02 NOTE — Progress Notes (Signed)
   Ortho Hand Progress Note  Subjective: No acute events. Still febrile over night.    Objective: Vital signs in last 24 hours: Temp:  [98.4 F (36.9 C)-103.5 F (39.7 C)] 99.2 F (37.3 C) (04/08 1248) Pulse Rate:  [93-123] 93 (04/08 1248) Resp:  [15-30] 21 (04/08 1248) BP: (105-171)/(55-98) 118/60 (04/08 1248) SpO2:  [82 %-100 %] 100 % (04/08 1248) Weight:  [123.4 kg] 123.4 kg (04/07 1434)  Intake/Output from previous day: 04/07 0701 - 04/08 0700 In: 2645.4 [P.O.:400; I.V.:1845.4; IV Piggyback:400] Out: 2 [Blood:2] Intake/Output this shift: Total I/O In: 236 [P.O.:236] Out: -   Recent Labs    08/01/20 1503 08/02/20 0046  HGB 10.4* 9.8*   Recent Labs    08/01/20 1503 08/02/20 0046  WBC 8.8 9.2  RBC 3.51* 3.20*  HCT 32.0* 30.4*  PLT 208 202   Recent Labs    08/01/20 1503 08/02/20 0046  NA 133* 134*  K 4.1 4.2  CL 98 102  CO2 22 21*  BUN 16 13  CREATININE 1.00 0.97  GLUCOSE 452* 311*  CALCIUM 9.5 8.8*   No results for input(s): LABPT, INR in the last 72 hours.  Aaox3 nad Resp nonlabored RRR LUE: partial amputation of the L IF. Decreased erythema to the digit and dorsal aspect of the hand and wrist. Small amount of purulent drainage from the finger tip. Tenderness still along the volar aspect of the digit.   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 DOS 4/7  - Hospitalists primary. Appreciate management - Cxs pending - Will continue to monitor her clinical improvement. May require repeat I&D over the weekend if finger does not improve on Abx - continue Abx - Remainder per primary    Cain Saupe III 08/02/2020, 1:22 PM  [phone]

## 2020-08-03 ENCOUNTER — Encounter (HOSPITAL_COMMUNITY)
Admission: EM | Disposition: A | Discharge status: Home / Self Care | Payer: Self-pay | Source: Home / Self Care | Attending: Internal Medicine

## 2020-08-03 ENCOUNTER — Inpatient Hospital Stay (HOSPITAL_COMMUNITY): Payer: 59 | Admitting: Anesthesiology

## 2020-08-03 DIAGNOSIS — E1165 Type 2 diabetes mellitus with hyperglycemia: Secondary | ICD-10-CM | POA: Diagnosis present

## 2020-08-03 DIAGNOSIS — E1143 Type 2 diabetes mellitus with diabetic autonomic (poly)neuropathy: Secondary | ICD-10-CM

## 2020-08-03 DIAGNOSIS — IMO0002 Reserved for concepts with insufficient information to code with codable children: Secondary | ICD-10-CM | POA: Diagnosis present

## 2020-08-03 HISTORY — PX: INCISION AND DRAINAGE ABSCESS: SHX5864

## 2020-08-03 LAB — GLUCOSE, CAPILLARY
Glucose-Capillary: 285 mg/dL — ABNORMAL HIGH (ref 70–99)
Glucose-Capillary: 249 mg/dL — ABNORMAL HIGH (ref 70–99)
Glucose-Capillary: 375 mg/dL — ABNORMAL HIGH (ref 70–99)
Glucose-Capillary: 440 mg/dL — ABNORMAL HIGH (ref 70–99)

## 2020-08-03 LAB — CBC
HCT: 28.5 % — ABNORMAL LOW (ref 36.0–46.0)
Hemoglobin: 9.2 g/dL — ABNORMAL LOW (ref 12.0–15.0)
MCH: 30.4 pg (ref 26.0–34.0)
MCHC: 32.3 g/dL (ref 30.0–36.0)
MCV: 94.1 fL (ref 80.0–100.0)
Platelets: 204 10*3/uL (ref 150–400)
RBC: 3.03 MIL/uL — ABNORMAL LOW (ref 3.87–5.11)
RDW: 15.2 % (ref 11.5–15.5)
WBC: 7.9 10*3/uL (ref 4.0–10.5)
nRBC: 0 % (ref 0.0–0.2)

## 2020-08-03 LAB — SURGICAL PCR SCREEN
MRSA, PCR: POSITIVE — AB
Staphylococcus aureus: POSITIVE — AB

## 2020-08-03 LAB — BASIC METABOLIC PANEL
Anion gap: 10 (ref 5–15)
BUN: 13 mg/dL (ref 6–20)
CO2: 24 mmol/L (ref 22–32)
Calcium: 8.7 mg/dL — ABNORMAL LOW (ref 8.9–10.3)
Chloride: 98 mmol/L (ref 98–111)
Creatinine, Ser: 0.96 mg/dL (ref 0.44–1.00)
GFR, Estimated: >60 mL/min (ref >60)
Glucose, Bld: 375 mg/dL — ABNORMAL HIGH (ref 70–99)
Potassium: 4.1 mmol/L (ref 3.5–5.1)
Sodium: 132 mmol/L — ABNORMAL LOW (ref 135–145)

## 2020-08-03 SURGERY — INCISION AND DRAINAGE, ABSCESS
Anesthesia: General | Site: Finger | Laterality: Left

## 2020-08-03 MED ORDER — PROPOFOL 500 MG/50ML IV EMUL
INTRAVENOUS | Status: DC | PRN
  Administered 2020-08-03: 130 ug/kg/min via INTRAVENOUS

## 2020-08-03 MED ORDER — OXYCODONE-ACETAMINOPHEN 5-325 MG PO TABS
1.0000 | ORAL_TABLET | ORAL | Status: DC | PRN
  Administered 2020-08-03 – 2020-08-05 (×8): 2 via ORAL
  Filled 2020-08-03 (×8): qty 2

## 2020-08-03 MED ORDER — INSULIN ASPART 100 UNIT/ML ~~LOC~~ SOLN
8.0000 [IU] | Freq: Once | SUBCUTANEOUS | Status: AC
  Administered 2020-08-04: 8 [IU] via SUBCUTANEOUS

## 2020-08-03 MED ORDER — MIDAZOLAM HCL 2 MG/2ML IJ SOLN
INTRAMUSCULAR | Status: AC
  Filled 2020-08-03: qty 2

## 2020-08-03 MED ORDER — FENTANYL CITRATE (PF) 100 MCG/2ML IJ SOLN
INTRAMUSCULAR | Status: DC | PRN
  Administered 2020-08-03 (×2): 50 ug via INTRAVENOUS

## 2020-08-03 MED ORDER — MUPIROCIN 2 % EX OINT
1.0000 "application " | TOPICAL_OINTMENT | Freq: Two times a day (BID) | CUTANEOUS | Status: AC
  Administered 2020-08-03 – 2020-08-07 (×10): 1 via NASAL
  Filled 2020-08-03 (×2): qty 22

## 2020-08-03 MED ORDER — PROPOFOL 10 MG/ML IV BOLUS
INTRAVENOUS | Status: DC | PRN
  Administered 2020-08-03: 200 mg via INTRAVENOUS

## 2020-08-03 MED ORDER — CHLORHEXIDINE GLUCONATE CLOTH 2 % EX PADS
6.0000 | MEDICATED_PAD | Freq: Every day | CUTANEOUS | Status: AC
  Administered 2020-08-03 – 2020-08-07 (×5): 6 via TOPICAL

## 2020-08-03 MED ORDER — FENTANYL CITRATE (PF) 100 MCG/2ML IJ SOLN
INTRAMUSCULAR | Status: AC
  Filled 2020-08-03: qty 2

## 2020-08-03 MED ORDER — LACTATED RINGERS IV SOLN: INTRAVENOUS | Status: DC | PRN

## 2020-08-03 MED ORDER — ONDANSETRON HCL 4 MG/2ML IJ SOLN
INTRAMUSCULAR | Status: DC | PRN
  Administered 2020-08-03: 4 mg via INTRAVENOUS

## 2020-08-03 MED ORDER — LIDOCAINE 2% (20 MG/ML) 5 ML SYRINGE
INTRAMUSCULAR | Status: DC | PRN
  Administered 2020-08-03: 100 mg via INTRAVENOUS

## 2020-08-03 MED ORDER — HYDROMORPHONE HCL 1 MG/ML IJ SOLN
0.5000 mg | INTRAMUSCULAR | Status: DC | PRN
  Filled 2020-08-03: qty 0.5

## 2020-08-03 MED ORDER — ONDANSETRON HCL 4 MG/2ML IJ SOLN
INTRAMUSCULAR | Status: AC
  Filled 2020-08-03: qty 2

## 2020-08-03 MED ORDER — PROPOFOL 1000 MG/100ML IV EMUL
INTRAVENOUS | Status: AC
  Filled 2020-08-03: qty 100

## 2020-08-03 MED ORDER — SODIUM CHLORIDE 0.9 % IR SOLN
Status: DC | PRN
  Administered 2020-08-03: 3000 mL

## 2020-08-03 SURGICAL SUPPLY — 34 items
BAG ZIPLOCK 12X15 (MISCELLANEOUS) ×2 IMPLANT
BNDG COHESIVE 4X5 TAN STRL (GAUZE/BANDAGES/DRESSINGS) ×2 IMPLANT
BNDG CONFORM 3 STRL LF (GAUZE/BANDAGES/DRESSINGS) ×2 IMPLANT
BNDG GAUZE ELAST 4 BULKY (GAUZE/BANDAGES/DRESSINGS) ×2 IMPLANT
COVER MAYO STAND STRL (DRAPES) ×2 IMPLANT
COVER SURGICAL LIGHT HANDLE (MISCELLANEOUS) ×2 IMPLANT
COVER WAND RF STERILE (DRAPES) IMPLANT
CUFF TOURN SGL QUICK 18X4 (TOURNIQUET CUFF) ×2 IMPLANT
CUFF TOURN SGL QUICK 24 (TOURNIQUET CUFF)
CUFF TRNQT CYL 24X4X16.5-23 (TOURNIQUET CUFF) IMPLANT
DRAPE U-SHAPE 47X51 STRL (DRAPES) ×2 IMPLANT
DRSG PAD ABDOMINAL 8X10 ST (GAUZE/BANDAGES/DRESSINGS) ×2 IMPLANT
ELECT REM PT RETURN 15FT ADLT (MISCELLANEOUS) ×2 IMPLANT
EVACUATOR 1/8 PVC DRAIN (DRAIN) ×2 IMPLANT
GAUZE XEROFORM 1X8 LF (GAUZE/BANDAGES/DRESSINGS) ×2 IMPLANT
GLOVE SRG 8 PF TXTR STRL LF DI (GLOVE) ×1 IMPLANT
GLOVE SURG SYN 7.5  E (GLOVE) ×2
GLOVE SURG SYN 7.5 E (GLOVE) ×1 IMPLANT
GLOVE SURG UNDER POLY LF SZ8 (GLOVE) ×2
KIT BASIN OR (CUSTOM PROCEDURE TRAY) ×2 IMPLANT
KIT TURNOVER KIT A (KITS) ×2 IMPLANT
NEEDLE HYPO 22GX1.5 SAFETY (NEEDLE) ×2 IMPLANT
NS IRRIG 1000ML POUR BTL (IV SOLUTION) ×2 IMPLANT
PACK ORTHO EXTREMITY (CUSTOM PROCEDURE TRAY) ×2 IMPLANT
PENCIL SMOKE EVACUATOR (MISCELLANEOUS) IMPLANT
PROTECTOR NERVE ULNAR (MISCELLANEOUS) ×2 IMPLANT
SET IRRIG Y TYPE TUR BLADDER L (SET/KITS/TRAYS/PACK) ×2 IMPLANT
SPLINT FIBERGLASS 4X30 (CAST SUPPLIES) ×2 IMPLANT
STOCKINETTE 8 INCH (MISCELLANEOUS) ×2 IMPLANT
SUT MNCRL AB 3-0 PS2 18 (SUTURE) ×4 IMPLANT
SUT PROLENE 4 0 PS 2 18 (SUTURE) ×4 IMPLANT
SWAB COLLECTION DEVICE MRSA (MISCELLANEOUS) IMPLANT
SWAB CULTURE ESWAB REG 1ML (MISCELLANEOUS) IMPLANT
SYR CONTROL 10ML LL (SYRINGE) ×2 IMPLANT

## 2020-08-03 NOTE — Anesthesia Postprocedure Evaluation (Signed)
Anesthesia Post Note  Patient: [name]  Procedure(s) Performed: INCISION AND DEBRIDEMENT LEFT INDEX FINGER (Left Finger)     Patient location during evaluation: PACU Anesthesia Type: General Level of consciousness: awake and alert Pain management: pain level controlled Vital Signs Assessment: post-procedure vital signs reviewed and stable Respiratory status: spontaneous breathing, nonlabored ventilation, respiratory function stable and patient connected to nasal cannula oxygen Cardiovascular status: blood pressure returned to baseline and stable Postop Assessment: no apparent nausea or vomiting Anesthetic complications: no   No complications documented.  Last Vitals:  Vitals:   08/03/20 1047 08/03/20 1212  BP: 120/63 121/61  Pulse: 92 95  Resp: 13 (!) 22  Temp: 37.1 C 37.3 C  SpO2: 90% 94%    Last Pain:  Vitals:   08/03/20 1249  TempSrc:   PainSc: 7                  Kristalyn Bergstresser

## 2020-08-03 NOTE — Op Note (Addendum)
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 Roney Mans, III, MD who was present and scrubbed for the entire case   ASSISTANT SURGEON: None.   ANESTHESIA: General  SURGICAL PROCEDURES: 1.  Repeat irrigation and debridement of left index finger partial amputation 2.  Irrigation and debridement of left index finger flexor tendon sheath 3.  Excision of the FDP tendon to the left index finger 4.  Neurolysis of the radial digital nerve  SURGICAL INDICATIONS: Patient is a 55 year old female who underwent a partial amputation for osteomyelitis of the left distal phalanx on Thursday.  She has been continued under inpatient care but has had persistent fevers as well as purulent drainage from the amputation site at the DIP joint.  Due to her continued pain, swelling, purulent drainage and fevers, I did recommend proceeding forward with repeat irrigation and debridement of the left index finger.  FINDING: Purulent fluid around the amputation site at the DIP joint.  This did track along the flexor tendon sheath with some cloudy purulent material around the flexor tendons.  Repeat irrigation and debridement was performed and new cultures were taken.  This did include debridement of the flexor tendon sheath and excision of the FDP tendon  DESCRIPTION OF PROCEDURE: I had a discussion with the patient on the hospital floor earlier today.  The risks, benefits and alternatives of the procedure were again discussed with the patient.  These risks include but are not limited to continued infection, bleeding, damage to surrounding structures including blood vessels and nerves, pain, stiffness, need for additional procedures.  Informed consent was obtained.  The patient was then brought back to the operative suite where timeout was performed identifying the correct patient operative site.  She was positioned supine on the  operative table and induced under general anesthesia.  A tourniquet was placed on the upper arm and the left upper extremity was prepped using a Betadine scrub and draped in usual sterile fashion.  The limb was exsanguinated by gravity and the tourniquet was inflated.  The previous sutures around the amputation site at the tip of the index finger were all removed.  This incision was extended along the radial border of the index finger and a mid lateral incision down into the palm.  Blunt dissection was carried down through the subcutaneous tissues protecting the radial neurovascular bundle.  The radial digital nerve and artery were identified and protected as the ulnarly based skin flap was elevated.  There was purulent material around the tip of the digit which did extend into the flexor tendon sheath.  This was again cultured for aerobic and anaerobic bacteria.  The flexor tendon sheath was fully visualized to the palmar aspect of the finger and there was liquidy purulent fluid around the flexor tendons.  The FDP tendon was identified just distal to the A2 pulley.  This had been previously released from the distal phalanx in her index surgery on Thursday.  The FDP tendon was retracted of the wound and excised.  This allowed for better access to the intact FDS tendon.  Thorough debridement was performed around the tip of the digit removing some necrotic and infected skin at the amputation site.  Additionally debridement of the flexor tendon was performed within the sheath removing all inflammatory tenosynovium.  The wound was then thoroughly irrigated with normal saline via cystoscopy tubing, 3 L of normal saline were used.  At this point all purulent material was removed  from finger.  The wound was loosely closed with interrupted 4-0 Prolene sutures.  Xeroform, 4 x 4's and well-padded soft dressing were applied.  The tourniquet was released and the patient had return of brisk capillary refill to the remaining  digits.  She was awoken from her anesthesia and taken the PACU in stable condition.  She tolerated the procedure well and there were no complications.  ESTIMATED BLOOD LOSS: 20 mL  TOURNIQUET TIME: Approximately 30 minutes  SPECIMENS: Aerobic and anaerobic cultures x1  POSTOPERATIVE PLAN patient will be transferred back up to the hospital floor for continued inpatient care under the hospitalist service.  We will monitor her cultures and progress going forward.  My hope is to not require a third irrigation debridement to her finger but we will monitor this going forward.    IMPLANTS: None  Debridement type: Excisional Debridement

## 2020-08-03 NOTE — Anesthesia Preprocedure Evaluation (Addendum)
Anesthesia Evaluation  Patient identified by MRN, date of birth, ID band Patient awake    Reviewed: Allergy & Precautions, NPO status , Patient's Chart, lab work & pertinent test results  History of Anesthesia Complications Negative for: history of anesthetic complications  Airway Mallampati: III  TM Distance: >3 FB Neck ROM: Full    Dental  (+) Dental Advisory Given, Teeth Intact   Pulmonary sleep apnea ,  Covid-19 Nucleic Acid Test Results Lab Results      Component                Value               Date                      SARSCOV2NAA              POSITIVE (A)        08/01/2020             asymptomatic incidental covid finding, no active uri symptoms   breath sounds clear to auscultation       Cardiovascular + DOE   Rhythm:Regular    Left ventricle: The cavity size was normal. Wall thickness was  increased in a pattern of moderate LVH. Systolic function was  vigorous. The estimated ejection fraction was in the range of 65%  to 70%. Wall motion was normal; there were no regional wall  motion abnormalities. Features are consistent with a pseudonormal  left ventricular filling pattern, with concomitant abnormal  relaxation and increased filling pressure (grade 2 diastolic  dysfunction).  - Aortic valve: Valve area (VTI): 2.35 cm^2. Valve area (Vmax):  2.13 cm^2. Valve area (Vmean): 2.21 cm^2.  - Pulmonary arteries: Systolic pressure was mildly increased. PA  peak pressure: 35 mm Hg (S).    Neuro/Psych  Headaches, PSYCHIATRIC DISORDERS Depression  Neuromuscular disease    GI/Hepatic negative GI ROS, Neg liver ROS,   Endo/Other  diabetes, Poorly ControlledMorbid obesityLab Results      Component                Value               Date                      HGBA1C                   7.7 (H)             08/01/2020             Renal/GU ARFRenal diseaseLab Results      Component                Value                Date                      CREATININE               0.96                08/03/2020           Lab Results      Component                Value               Date  K                        4.1                 08/03/2020                Musculoskeletal  (+) Arthritis ,   Abdominal   Peds  Hematology  (+) anemia , Lab Results      Component                Value               Date                      WBC                      7.9                 08/03/2020                HGB                      9.2 (L)             08/03/2020                HCT                      28.5 (L)            08/03/2020                MCV                      94.1                08/03/2020                PLT                      204                 08/03/2020              Anesthesia Other Findings   Reproductive/Obstetrics                            Anesthesia Physical Anesthesia Plan  ASA: III  Anesthesia Plan: General   Post-op Pain Management:    Induction: Intravenous  PONV Risk Score and Plan: 3 and Ondansetron and Propofol infusion  Airway Management Planned: LMA  Additional Equipment: None  Intra-op Plan:   Post-operative Plan: Extubation in OR  Informed Consent: I have reviewed the patients History and Physical, chart, labs and discussed the procedure including the risks, benefits and alternatives for the proposed anesthesia with the patient or authorized representative who has indicated his/her understanding and acceptance.     Dental advisory given  Plan Discussed with: CRNA and Surgeon  Anesthesia Plan Comments:         Anesthesia Quick Evaluation

## 2020-08-03 NOTE — Progress Notes (Signed)
   Ortho Hand Progress Note  Subjective: No acute events last night. Still febrile once. Pain in finger somewhat improved.  Objective: Vital signs in last 24 hours: Temp:  [98 F (36.7 C)-103.5 F (39.7 C)] 99.2 F (37.3 C) (04/09 0500) Pulse Rate:  [89-103] 100 (04/09 0500) Resp:  [18-30] 20 (04/09 0500) BP: (116-153)/(56-64) 128/56 (04/09 0500) SpO2:  [88 %-100 %] 93 % (04/09 0500)  Intake/Output from previous day: 04/08 0701 - 04/09 0700 In: 536 [P.O.:236; IV Piggyback:300] Out: -  Intake/Output this shift: No intake/output data recorded.  Recent Labs    08/01/20 1503 08/02/20 0046 08/03/20 0403  HGB 10.4* 9.8* 9.2*   Recent Labs    08/02/20 0046 08/03/20 0403  WBC 9.2 7.9  RBC 3.20* 3.03*  HCT 30.4* 28.5*  PLT 202 204   Recent Labs    08/02/20 0046 08/03/20 0403  NA 134* 132*  K 4.2 4.1  CL 102 98  CO2 21* 24  BUN 13 13  CREATININE 0.97 0.96  GLUCOSE 311* 375*  CALCIUM 8.8* 8.7*   No results for input(s): LABPT, INR in the last 72 hours.  Aaox3 nad Resp nonlabored RRR LUE: partial amputation of the L IF. Continued erythema to the digit palmar and dorsal. Small amount of purulent drainage still from the finger tip. Tenderness still along the volar aspect of the digit extending into the palm   Assessment/Plan: L IF osteomyelitis s/p I&D with partial amputation. DOS 4/7  - Hospitalists primary. Appreciate management - Cxs GPCs - Plan for repeat I&D of finger today. R/B/A discussed with patient once again.  - NPO - continue Abx - Remainder per primary    [name] 08/03/2020, 7:37 AM  [phone]

## 2020-08-03 NOTE — Plan of Care (Signed)
Problem: Education: Goal: Knowledge of General Education information will improve Description: Including pain rating scale, medication(s)/side effects and non-pharmacologic comfort measures 08/03/2020 0059 by Annitta Jersey, RN Outcome: Progressing 08/03/2020 0057 by Annitta Jersey, RN Outcome: Progressing   Problem: Health Behavior/Discharge Planning: Goal: Ability to manage health-related needs will improve 08/03/2020 0059 by Llana Aliment D, RN Outcome: Progressing 08/03/2020 0057 by Llana Aliment D, RN Outcome: Progressing   Problem: Clinical Measurements: Goal: Ability to maintain clinical measurements within normal limits will improve 08/03/2020 0059 by Llana Aliment D, RN Outcome: Progressing 08/03/2020 0057 by Llana Aliment D, RN Outcome: Progressing Goal: Will remain free from infection 08/03/2020 0059 by Llana Aliment D, RN Outcome: Progressing 08/03/2020 0057 by Llana Aliment D, RN Outcome: Progressing Goal: Diagnostic test results will improve 08/03/2020 0059 by Llana Aliment D, RN Outcome: Progressing 08/03/2020 0057 by Llana Aliment D, RN Outcome: Progressing Goal: Respiratory complications will improve 08/03/2020 0059 by Llana Aliment D, RN Outcome: Progressing 08/03/2020 0057 by Llana Aliment D, RN Outcome: Progressing Goal: Cardiovascular complication will be avoided 08/03/2020 0059 by Llana Aliment D, RN Outcome: Progressing 08/03/2020 0057 by Annitta Jersey, RN Outcome: Progressing   Problem: Activity: Goal: Risk for activity intolerance will decrease 08/03/2020 0059 by Llana Aliment D, RN Outcome: Progressing 08/03/2020 0057 by Llana Aliment D, RN Outcome: Progressing   Problem: Nutrition: Goal: Adequate nutrition will be maintained 08/03/2020 0059 by Llana Aliment D, RN Outcome: Progressing 08/03/2020 0057 by Llana Aliment D, RN Outcome: Progressing   Problem: Coping: Goal: Level of anxiety will decrease 08/03/2020 0059 by Llana Aliment D, RN Outcome:  Progressing 08/03/2020 0057 by Llana Aliment D, RN Outcome: Progressing   Problem: Elimination: Goal: Will not experience complications related to bowel motility 08/03/2020 0059 by Llana Aliment D, RN Outcome: Progressing 08/03/2020 0057 by Llana Aliment D, RN Outcome: Progressing Goal: Will not experience complications related to urinary retention 08/03/2020 0059 by Annitta Jersey, RN Outcome: Progressing 08/03/2020 0057 by Annitta Jersey, RN Outcome: Progressing   Problem: Pain Managment: Goal: General experience of comfort will improve 08/03/2020 0059 by Annitta Jersey, RN Outcome: Progressing 08/03/2020 0057 by Llana Aliment D, RN Outcome: Progressing   Problem: Safety: Goal: Ability to remain free from injury will improve 08/03/2020 0059 by Annitta Jersey, RN Outcome: Progressing 08/03/2020 0057 by Llana Aliment D, RN Outcome: Progressing   Problem: Skin Integrity: Goal: Risk for impaired skin integrity will decrease 08/03/2020 0059 by Llana Aliment D, RN Outcome: Progressing 08/03/2020 0057 by Llana Aliment D, RN Outcome: Progressing   Problem: Education: Goal: Knowledge of risk factors and measures for prevention of condition will improve 08/03/2020 0059 by Annitta Jersey, RN Outcome: Progressing 08/03/2020 0057 by Llana Aliment D, RN Outcome: Progressing   Problem: Coping: Goal: Psychosocial and spiritual needs will be supported 08/03/2020 0059 by Llana Aliment D, RN Outcome: Progressing 08/03/2020 0057 by Llana Aliment D, RN Outcome: Progressing   Problem: Respiratory: Goal: Will maintain a patent airway 08/03/2020 0059 by Llana Aliment D, RN Outcome: Progressing 08/03/2020 0057 by Annitta Jersey, RN Outcome: Progressing Goal: Complications related to the disease process, condition or treatment will be avoided or minimized 08/03/2020 0059 by Annitta Jersey, RN Outcome: Progressing 08/03/2020 0057 by Llana Aliment D, RN Outcome: Progressing   Problem:  Education: Goal: Knowledge of risk factors and measures for prevention of condition will improve Outcome: Progressing   Problem: Respiratory: Goal: Will maintain a patent airway Outcome: Progressing Goal: Complications related to the disease  process, condition or treatment will be avoided or minimized Outcome: Progressing   Problem: Education: Goal: Required Educational Video(s) Outcome: Progressing   Problem: Clinical Measurements: Goal: Postoperative complications will be avoided or minimized Outcome: Progressing   Problem: Skin Integrity: Goal: Demonstration of wound healing without infection will improve Outcome: Progressing

## 2020-08-03 NOTE — Transfer of Care (Addendum)
Immediate Anesthesia Transfer of Care Note  Patient: Rhys R Loa  Procedure(s) Performed: INCISION AND DEBRIDEMENT LEFT INDEX FINGER (Left Finger)  Patient Location: 1444  Anesthesia Type:General  Level of Consciousness: awake, alert  and oriented  Airway & Oxygen Therapy: Patient Spontanous Breathing and Patient connected to nasal cannula oxygen  Post-op Assessment: Report given to RN and Post -op Vital signs reviewed and stable  Post vital signs: Reviewed and stable  Last Vitals:  Vitals Value Taken Time  BP 121/61 08/03/20 1212  Temp 37.3 C 08/03/20 1212  Pulse 95 08/03/20 1212  Resp 26 08/03/20 1226  SpO2 94 % 08/03/20 1212  Vitals shown include unvalidated device data.  Last Pain:  Vitals:   08/03/20 1212  TempSrc: Oral  PainSc:       Patients Stated Pain Goal: 4 (08/03/20 1153)  Complications: No complications documented.

## 2020-08-03 NOTE — Progress Notes (Signed)
PROGRESS NOTE    [name]  NWG:[id number] DOB: October 02, 1965 DOA: 08/01/2020 PCP: [name], MD   Brief Narrative:  [name] is a 55 y.o. 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 Patient reports neuropathy of her hands and frequently gets injuries and burns to her fingers with multiple resultant infections. In the ED, she was febrile up to 101.6, mildly tachycardic with lactic acid of 5. Left hand x-ray shows osteolytic changes of the distal phalanx of the second digit.  She was also incidentally found to be Covid positive but denies any respiratory symptoms including shortness of breath, cough or chest pain.  No nausea, vomiting, diarrhea or abdominal pain. Patient was transferred to [facility] and taken immediately to the OR by hand surgeon Dr. 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 Roney Mans following - appreciate insight and recommendations - Status post partial amputation 08/01/2020; repeat I&D 08/03/2020 - Pain currently well controlled on PRN oxycodone -does not do well with morphine or Dilaudid IV. -Preliminary cultures remarkable for gram-positive cocci,  transition to vancomycin only per discussion with pharmacy until cultures result, discontinue cefepime - Patient presented with fever, tachycardia, tachypnea and lactic acidosis of 5 -continue IV fluids until p.o. intake improves  Uncontrolled Type 2 diabetes with neuropathy -Continues to have hyperglycemia -A1c remains uncontrolled at 7.7 -Lengthy discussion about need for proper dietary compliance and medication compliance given worsening neuropathy and multiple episodes of osteomyelitis and infection -patient is under the impression that her diabetes is "well-controlled" throughout the year and is only high when she has an infection.  We discussed need for closer monitoring and likely referral to endocrinology in the outpatient setting given her multiple amputations. -Continue moderate sliding scale, hypoglycemic protocol, continue to adjust insulin accordingly.  Incidental COVID positive status, POA Cannot rule out acute viral infection although less likely -Patient remains asymptomatic, no known sick contacts, has been vaccinated x2, history of Covid infection October 2021, no booster  -Contact precautions per protocol but given patient's asymptomatic nature, unimpressive labs and imaging will not acutely treat patient.  Certainly if patient becomes symptomatic would consider steroids and Remdesivir but again less likely given above.  OSA Continue home CPAP unit  Morbid obesity BMI greater than 40 I DVT prophylaxis: SCDs only, early ambulation Code Status: Full Family Communication: None present  Status is: Inpatient  Dispo: The patient is from: Home              Anticipated d/c is to: Home              Anticipated d/c date is: 48 to 72 hours pending clinical course and cultures              Patient  currently not medically stable for discharge given her recent surgical procedure, need for IV antibiotics and close monitoring.  Consultants:   Orthopedic surgery, Dr.  Roney Mansreighton  Procedures:   Partial amputation left index finger 08/01/2020; repeat I&D with washout on 08/03/2020  Antimicrobials:  Cefepime 08/01/2020 discontinued 08/02/2020 Vancomycin 08/01/2020, ongoing  Subjective: No acute issues or events overnight, patient back down this morning for reevaluation and repeat I&D with washout today.  Tolerated well, transitioning back to oxycodone given pain poorly controlled on morphine and Dilaudid per the patient.  Otherwise denies nausea vomiting diarrhea constipation headache fevers or chills.  Objective: Vitals:   08/02/20 1811 08/02/20 2142 08/03/20 0147 08/03/20 0500  BP: 116/61 (!) 116/58 (!) 151/59 (!) 128/56  Pulse: 93 92 100 100  Resp: 18 20 20 20   Temp: 100.1 F (37.8 C) 99 F (37.2 C) (!) 101.2 F (38.4 C) 99.2 F (37.3 C)  TempSrc: Oral Oral Oral Oral  SpO2: 93% 96% (!) 88% 93%  Weight:      Height:        Intake/Output Summary (Last 24 hours) at 08/03/2020 0812 Last data filed at 08/02/2020 1800 Gross per 24 hour  Intake 536 ml  Output --  Net 536 ml   Filed Weights   08/01/20 1434  Weight: 123.4 kg    Examination:  General exam: Appears calm and comfortable  Respiratory system: Clear to auscultation. Respiratory effort normal. Cardiovascular system: S1 & S2 heard, RRR. No JVD, murmurs, rubs, gallops or clicks. No pedal edema. Gastrointestinal system: Abdomen is nondistended, soft and nontender. No organomegaly or masses felt. Normal bowel sounds heard. Central nervous system: Alert and oriented. No focal neurological deficits. Extremities: Symmetric 5 x 5 power. Skin: No rashes, lesions or ulcers Psychiatry: Judgement and insight appear normal. Mood & affect appropriate.   Data Reviewed: I have personally reviewed following labs and imaging studies  CBC: Recent Labs  Lab 08/01/20 1503 08/02/20 0046 08/03/20 0403  WBC 8.8 9.2 7.9  NEUTROABS 7.1  --   --   HGB 10.4* 9.8* 9.2*  HCT 32.0* 30.4* 28.5*  MCV  91.2 95.0 94.1  PLT 208 202 204   Basic Metabolic Panel: Recent Labs  Lab 08/01/20 1503 08/02/20 0046 08/03/20 0403  NA 133* 134* 132*  K 4.1 4.2 4.1  CL 98 102 98  CO2 22 21* 24  GLUCOSE 452* 311* 375*  BUN 16 13 13   CREATININE 1.00 0.97 0.96  CALCIUM 9.5 8.8* 8.7*   GFR: Estimated Creatinine Clearance: 86.9 mL/min (by C-G formula based on SCr of 0.96 mg/dL). Liver Function Tests: Recent Labs  Lab 08/01/20 1503  AST 17  ALT 19  ALKPHOS 87  BILITOT 0.6  PROT 6.9  ALBUMIN 3.7   No results for input(s): LIPASE, AMYLASE in the last 168 hours. No results for input(s): AMMONIA in the last 168 hours. Coagulation Profile: No results for input(s): INR, PROTIME in the last 168 hours. Cardiac Enzymes: No results for input(s): CKTOTAL, CKMB, CKMBINDEX, TROPONINI in the last 168 hours. BNP (last 3 results) No results for input(s): PROBNP in the last 8760 hours. HbA1C: Recent Labs    08/01/20 2227  HGBA1C 7.7*   CBG: Recent Labs  Lab 08/02/20 0735 08/02/20 1135 08/02/20 1621 08/02/20 2139 08/03/20 0737  GLUCAP 230* 296* 305* 379* 285*   Lipid Profile: No results for input(s): CHOL, HDL, LDLCALC, TRIG, CHOLHDL, LDLDIRECT in the last 72 hours. Thyroid Function Tests: No results for input(s): TSH, T4TOTAL, FREET4, T3FREE,  THYROIDAB in the last 72 hours. Anemia Panel: No results for input(s): VITAMINB12, FOLATE, FERRITIN, TIBC, IRON, RETICCTPCT in the last 72 hours. Sepsis Labs: Recent Labs  Lab 08/01/20 1503 08/01/20 1658 08/01/20 2230 08/02/20 0046  LATICACIDVEN 5.0* 3.3* 2.2* 2.5*    Recent Results (from the past 240 hour(s))  Blood culture (routine x 2)     Status: None (Preliminary result)   Collection Time: 08/01/20  2:48 PM   Specimen: BLOOD  Result Value Ref Range Status   Specimen Description BLOOD RIGHT ANTECUBITAL  Final   Special Requests   Final    BOTTLES DRAWN AEROBIC AND ANAEROBIC Blood Culture adequate volume   Culture   Final    NO  GROWTH < 12 HOURS Performed at [facility], [address], [place], [place] [address]    Report Status PENDING  Incomplete  Blood culture (routine x 2)     Status: None (Preliminary result)   Collection Time: 08/01/20  2:54 PM   Specimen: BLOOD  Result Value Ref Range Status   Specimen Description BLOOD LEFT ANTECUBITAL  Final   Special Requests   Final    BOTTLES DRAWN AEROBIC AND ANAEROBIC Blood Culture adequate volume Performed at [facility], [address] [place], [place] [address]    Culture PENDING  Incomplete   Report Status PENDING  Incomplete  Resp Panel by RT-PCR (Flu A&B, Covid) Nasopharyngeal Swab     Status: Abnormal   Collection Time: 08/01/20  4:06 PM   Specimen: Nasopharyngeal Swab; Nasopharyngeal(NP) swabs in vial transport medium  Result Value Ref Range Status   SARS Coronavirus 2 by RT PCR POSITIVE (A) NEGATIVE Final    Comment: RESULT CALLED TO, READ BACK BY AND VERIFIED WITH: Nelia Shi RN [id number] (NOTE) SARS-CoV-2 target nucleic acids are DETECTED.  The SARS-CoV-2 RNA is generally detectable in upper respiratory specimens during the acute phase of infection. Positive results are indicative of the presence of the identified virus, but do not rule out bacterial infection or co-infection with other pathogens not detected by the test. Clinical correlation with patient history and other diagnostic information is necessary to determine patient infection status. The expected result is Negative.  Fact Sheet for Patients: BloggerCourse.com  Fact Sheet for Healthcare Providers: SeriousBroker.it  This test is not yet approved or cleared by the [place] FDA and  has been authorized for detection and/or diagnosis of SARS-CoV-2 by FDA under an Emergency Use Authorization (EUA).  This EUA will remain in effect (meaning this test can be used) for  the duration of  the COVID-19 declaration under  Section 564(b)(1) of the Act, 21 U.S.C. section 360bbb-3(b)(1), unless the authorization is terminated or revoked sooner.     Influenza A by PCR NEGATIVE NEGATIVE Final   Influenza B by PCR NEGATIVE NEGATIVE Final    Comment: (NOTE) The Xpert Xpress SARS-CoV-2/FLU/RSV plus assay is intended as an aid in the diagnosis of influenza from Nasopharyngeal swab specimens and should not be used as a sole basis for treatment. Nasal washings and aspirates are unacceptable for Xpert Xpress SARS-CoV-2/FLU/RSV testing.  Fact Sheet for Patients: BloggerCourse.com  Fact Sheet for Healthcare Providers: SeriousBroker.it  This test is not yet approved or cleared by the [place] FDA and has been authorized for detection and/or diagnosis of SARS-CoV-2 by FDA under an Emergency Use Authorization (EUA). This EUA will remain in effect (meaning this test can be used) for the duration of the COVID-19 declaration under Section  564(b)(1) of the Act, 21 U.S.C. section 360bbb-3(b)(1), unless the authorization is terminated or revoked.  Performed at [facility]   Aerobic/Anaerobic Culture w Gram Stain (surgical/deep wound)     Status: None (Preliminary result)   Collection Time: 08/01/20  7:13 PM   Specimen: Joint, Finger  Result Value Ref Range Status   Specimen Description   Final    FINGER LEFT INDEX Performed at [facility], [address] [place], [place] [address]    Special Requests   Final    NONE Performed at [facility], [address] [place], [place] [address]    Gram Stain   Final    NO WBC SEEN MODERATE GRAM POSITIVE COCCI Performed at [facility], [address], [place], [place] [address]    Culture PENDING  Incomplete   Report Status PENDING  Incomplete         Radiology Studies: DG Finger Index Left  Result Date: 08/01/2020 CLINICAL DATA:  Pain and  redness EXAM: LEFT SECOND FINGER 2+V COMPARISON:  None. FINDINGS: Frontal, oblique, and lateral views obtained. There is diffuse soft tissue swelling. There is loss of cortex along the distal most aspect of the second distal phalanx with a small focus of calcification lateral to the distal aspect of the second distal phalanx. No fracture or dislocation. No erosive change. No radiopaque foreign body or soft tissue air. IMPRESSION: Diffuse soft tissue swelling second digit. Loss of cortex along the distal most aspect of the second distal phalanx concerning for focal bony destruction. Smiled fragmentation in this area noted. No other findings suggesting bony destruction. No acute fracture or dislocation. Electronically Signed   By: Bretta Bang III M.D.   On: 08/01/2020 15:24   Scheduled Meds: . DULoxetine  60 mg Oral Daily  . gabapentin  300 mg Oral BID  . insulin aspart  0-15 Units Subcutaneous TID WC  . insulin aspart  0-5 Units Subcutaneous QHS  . oxybutynin  10 mg Oral Daily   Continuous Infusions: . sodium chloride 250 mL (08/01/20 1546)  . lactated ringers    . vancomycin 1,500 mg (08/02/20 1759)     LOS: 2 days   Time spent:  Azucena Fallen, DO [facility]  If 7PM-7AM, please contact night-coverage [email or web address]  08/03/2020, 8:12 AM

## 2020-08-03 NOTE — Anesthesia Procedure Notes (Signed)
Procedure Name: LMA Insertion Date/Time: 08/03/2020 9:21 AM Performed by: Doran Clay, CRNA Pre-anesthesia Checklist: Patient identified, Emergency Drugs available, Suction available, Patient being monitored and Timeout performed Patient Re-evaluated:Patient Re-evaluated prior to induction Oxygen Delivery Method: Circle system utilized Preoxygenation: Pre-oxygenation with 100% oxygen Induction Type: IV induction LMA: LMA inserted LMA Size: 4.0 Tube type: Oral Number of attempts: 1 Placement Confirmation: positive ETCO2 and breath sounds checked- equal and bilateral Tube secured with: Tape Dental Injury: Teeth and Oropharynx as per pre-operative assessment

## 2020-08-04 LAB — BASIC METABOLIC PANEL
Anion gap: 9 (ref 5–15)
BUN: 13 mg/dL (ref 6–20)
CO2: 25 mmol/L (ref 22–32)
Calcium: 8.4 mg/dL — ABNORMAL LOW (ref 8.9–10.3)
Chloride: 99 mmol/L (ref 98–111)
Creatinine, Ser: 0.96 mg/dL (ref 0.44–1.00)
GFR, Estimated: >60 mL/min (ref >60)
Glucose, Bld: 348 mg/dL — ABNORMAL HIGH (ref 70–99)
Potassium: 3.8 mmol/L (ref 3.5–5.1)
Sodium: 133 mmol/L — ABNORMAL LOW (ref 135–145)

## 2020-08-04 LAB — CBC
HCT: 27.7 % — ABNORMAL LOW (ref 36.0–46.0)
Hemoglobin: 8.7 g/dL — ABNORMAL LOW (ref 12.0–15.0)
MCH: 29.8 pg (ref 26.0–34.0)
MCHC: 31.4 g/dL (ref 30.0–36.0)
MCV: 94.9 fL (ref 80.0–100.0)
Platelets: 221 10*3/uL (ref 150–400)
RBC: 2.92 MIL/uL — ABNORMAL LOW (ref 3.87–5.11)
RDW: 15.1 % (ref 11.5–15.5)
WBC: 7.8 10*3/uL (ref 4.0–10.5)
nRBC: 0 % (ref 0.0–0.2)

## 2020-08-04 LAB — GLUCOSE, CAPILLARY
Glucose-Capillary: 281 mg/dL — ABNORMAL HIGH (ref 70–99)
Glucose-Capillary: 394 mg/dL — ABNORMAL HIGH (ref 70–99)
Glucose-Capillary: 331 mg/dL — ABNORMAL HIGH (ref 70–99)
Glucose-Capillary: 375 mg/dL — ABNORMAL HIGH (ref 70–99)

## 2020-08-04 MED ORDER — INSULIN GLARGINE 100 UNIT/ML ~~LOC~~ SOLN
10.0000 [IU] | Freq: Every day | SUBCUTANEOUS | Status: DC
  Administered 2020-08-04: 10 [IU] via SUBCUTANEOUS
  Filled 2020-08-04: qty 0.1

## 2020-08-04 MED ORDER — INSULIN GLARGINE 100 UNIT/ML ~~LOC~~ SOLN
10.0000 [IU] | Freq: Two times a day (BID) | SUBCUTANEOUS | Status: DC
  Administered 2020-08-04: 10 [IU] via SUBCUTANEOUS
  Filled 2020-08-04 (×2): qty 0.1

## 2020-08-04 NOTE — Progress Notes (Signed)
   Ortho Hand Progress Note  Subjective: No acute events last night. Febrile once last night but patient states she feels much better today and pain in L hand improved  Objective: Vital signs in last 24 hours: Temp:  [98.5 F (36.9 C)-102.9 F (39.4 C)] 98.5 F (36.9 C) (04/10 0545) Pulse Rate:  [87-103] 87 (04/10 0545) Resp:  [13-22] 18 (04/10 0545) BP: (120-150)/(61-77) 134/62 (04/10 0545) SpO2:  [90 %-99 %] 99 % (04/10 0545)  Intake/Output from previous day: 04/09 0701 - 04/10 0700 In: 400 [I.V.:400] Out: -  Intake/Output this shift: Total I/O In: 240 [P.O.:240] Out: -   Recent Labs    08/01/20 1503 08/02/20 0046 08/03/20 0403 08/04/20 0324  HGB 10.4* 9.8* 9.2* 8.7*   Recent Labs    08/03/20 0403 08/04/20 0324  WBC 7.9 7.8  RBC 3.03* 2.92*  HCT 28.5* 27.7*  PLT 204 221   Recent Labs    08/03/20 0403 08/04/20 0324  NA 132* 133*  K 4.1 3.8  CL 98 99  CO2 24 25  BUN 13 13  CREATININE 0.96 0.96  GLUCOSE 375* 348*  CALCIUM 8.7* 8.4*   No results for input(s): LABPT, INR in the last 72 hours.  Aaox3 nad Resp nonlabored RRR LUE: partial amputation of the L IF. Decreased erythema to the L hand. Still with purulence from the IF incision but significantly less tenderness to palpation to the finger and into the hand. Improved IF flexion at MP and PIP joint with decreased pain as well.   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 DOS 4/7. Repeat I&D on 4/9  - Hospitalists primary. Appreciate management - Cxs Staph - BID dressing changes. Encourage IF flex/ex - continue Abx - Remainder per primary    Cain Saupe III 08/04/2020, 10:21 AM  [phone]

## 2020-08-04 NOTE — Plan of Care (Signed)
Patient afebrile on 7 a to 7 p shift.  Pain well controlled alternating Norco and Percocet.
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
## 2020-08-04 NOTE — Plan of Care (Signed)
  Problem: Health Behavior/Discharge Planning: Goal: Ability to manage health-related needs will improve Outcome: Progressing   Problem: Clinical Measurements: Goal: Ability to maintain clinical measurements within normal limits will improve Outcome: Progressing Goal: Will remain free from infection Outcome: Progressing Goal: Diagnostic test results will improve Outcome: Progressing Goal: Respiratory complications will improve Outcome: Progressing Goal: Cardiovascular complication will be avoided Outcome: Progressing   Problem: Activity: Goal: Risk for activity intolerance will decrease Outcome: Progressing   Problem: Nutrition: Goal: Adequate nutrition will be maintained Outcome: Progressing   Problem: Coping: Goal: Level of anxiety will decrease Outcome: Progressing   Problem: Elimination: Goal: Will not experience complications related to bowel motility Outcome: Progressing Goal: Will not experience complications related to urinary retention Outcome: Progressing   Problem: Pain Managment: Goal: General experience of comfort will improve Outcome: Progressing   Problem: Safety: Goal: Ability to remain free from injury will improve Outcome: Progressing   Problem: Skin Integrity: Goal: Risk for impaired skin integrity will decrease Outcome: Progressing   Problem: Education: Goal: Knowledge of risk factors and measures for prevention of condition will improve Outcome: Progressing   Problem: Coping: Goal: Psychosocial and spiritual needs will be supported Outcome: Progressing   Problem: Respiratory: Goal: Will maintain a patent airway Outcome: Progressing Goal: Complications related to the disease process, condition or treatment will be avoided or minimized Outcome: Progressing   Problem: Education: Goal: Knowledge of risk factors and measures for prevention of condition will improve Outcome: Progressing   Problem: Respiratory: Goal: Will maintain a  patent airway Outcome: Progressing Goal: Complications related to the disease process, condition or treatment will be avoided or minimized Outcome: Progressing   Problem: Education: Goal: Required Educational Video(s) Outcome: Progressing   Problem: Clinical Measurements: Goal: Postoperative complications will be avoided or minimized Outcome: Progressing   Problem: Skin Integrity: Goal: Demonstration of wound healing without infection will improve Outcome: Progressing

## 2020-08-04 NOTE — Progress Notes (Signed)
Pharmacy Antibiotic Note  [name] is a 55 y.o. female admitted on 08/01/2020. 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 Pt currently on antibiotics for wound/OM of left index finger. Wound culture growing MRSA.  -4/7: I&D w/partial amputation -4/9: repeat I&D  Today, 08/04/20  WBC WNL  SCr 0.96 - stable, WNL.  Day #4 IV vancomycin  Plan:  Continue vancomycin 1500 mg IV q24h  Goal vancomycin AUC 400-550  Monitor renal function, vancomycin levels as needed   Height: 5\' 4"  (162.6 cm) Weight: 123.4 kg (272 lb) IBW/kg (Calculated) : 54.7  Temp (24hrs), Avg:100.2 F (37.9 C), Min:98.5 F (36.9 C), Max:102.9 F (39.4 C)  Recent Labs  Lab 08/01/20 1503 08/01/20 1658 08/01/20 2230 08/02/20 0046 08/03/20 0403 08/04/20 0324  WBC 8.8  --   --  9.2 7.9 7.8  CREATININE 1.00  --   --  0.97 0.96 0.96  LATICACIDVEN 5.0* 3.3* 2.2* 2.5*  --   --     Estimated Creatinine Clearance: 86.9 mL/min (by C-G formula based on SCr of 0.96 mg/dL).    Allergies  Allergen Reactions  . Rocephin [Ceftriaxone] Shortness Of Breath and Swelling    Tolerated course of Rocephin 6/19 and multiple courses of Ancef/Keflex as well as Cefepime  . Daptomycin Other (See Comments)    Chills and lower back pain; Patient states had issues due to this medication and Vancomycin ran at the same time; Patient states related to infected PICC Line   . Vancomycin Other (See Comments)    Drives creatinine level(s) "through the roof" and they didn't return to normal for approx 14 days Reaction happened in march, has since had vancomycin without problem. Patient states was related to infected PICC Line; after PICC changed no problems.    Antimicrobials this admission: 4/7 vanc >> 4/7 cefepime >> 4/8  Dose adjustments this admission:  Microbiology results: 4/7 BCx: ngtd 4/7 wound : Abundant MRSA 4/9 surgical deep wound: Few staph  aureus (pending)  Thank you for allowing pharmacy to be a part of this patient's care.  6/9, PharmD 08/04/20 12:17 PM

## 2020-08-05 LAB — CBC
HCT: 29.8 % — ABNORMAL LOW (ref 36.0–46.0)
Hemoglobin: 9.2 g/dL — ABNORMAL LOW (ref 12.0–15.0)
MCH: 29.7 pg (ref 26.0–34.0)
MCHC: 30.9 g/dL (ref 30.0–36.0)
MCV: 96.1 fL (ref 80.0–100.0)
Platelets: 244 10*3/uL (ref 150–400)
RBC: 3.1 MIL/uL — ABNORMAL LOW (ref 3.87–5.11)
RDW: 14.9 % (ref 11.5–15.5)
WBC: 6.7 10*3/uL (ref 4.0–10.5)
nRBC: 0.3 % — ABNORMAL HIGH (ref 0.0–0.2)

## 2020-08-05 LAB — BASIC METABOLIC PANEL
Anion gap: 9 (ref 5–15)
BUN: 13 mg/dL (ref 6–20)
CO2: 24 mmol/L (ref 22–32)
Calcium: 8.5 mg/dL — ABNORMAL LOW (ref 8.9–10.3)
Chloride: 99 mmol/L (ref 98–111)
Creatinine, Ser: 0.99 mg/dL (ref 0.44–1.00)
GFR, Estimated: >60 mL/min (ref >60)
Glucose, Bld: 331 mg/dL — ABNORMAL HIGH (ref 70–99)
Potassium: 4 mmol/L (ref 3.5–5.1)
Sodium: 132 mmol/L — ABNORMAL LOW (ref 135–145)

## 2020-08-05 LAB — GLUCOSE, CAPILLARY
Glucose-Capillary: 324 mg/dL — ABNORMAL HIGH (ref 70–99)
Glucose-Capillary: 252 mg/dL — ABNORMAL HIGH (ref 70–99)
Glucose-Capillary: 298 mg/dL — ABNORMAL HIGH (ref 70–99)
Glucose-Capillary: 418 mg/dL — ABNORMAL HIGH (ref 70–99)
Glucose-Capillary: 365 mg/dL — ABNORMAL HIGH (ref 70–99)

## 2020-08-05 LAB — VANCOMYCIN, TROUGH: Vancomycin Tr: 7 ug/mL — ABNORMAL LOW (ref 15–20)

## 2020-08-05 LAB — VANCOMYCIN, PEAK: Vancomycin Pk: 38 ug/mL (ref 30–40)

## 2020-08-05 LAB — GLUCOSE, RANDOM: Glucose, Bld: 436 mg/dL — ABNORMAL HIGH (ref 70–99)

## 2020-08-05 MED ORDER — VANCOMYCIN HCL 1000 MG/200ML IV SOLN
1000.0000 mg | Freq: Two times a day (BID) | INTRAVENOUS | Status: DC
  Administered 2020-08-06 – 2020-08-09 (×7): 1000 mg via INTRAVENOUS
  Filled 2020-08-05 (×7): qty 200

## 2020-08-05 MED ORDER — OXYCODONE HCL ER 15 MG PO T12A
15.0000 mg | EXTENDED_RELEASE_TABLET | Freq: Two times a day (BID) | ORAL | Status: DC
  Administered 2020-08-05 – 2020-08-06 (×3): 15 mg via ORAL
  Filled 2020-08-05 (×3): qty 1

## 2020-08-05 MED ORDER — INSULIN GLARGINE 100 UNIT/ML ~~LOC~~ SOLN
15.0000 [IU] | Freq: Two times a day (BID) | SUBCUTANEOUS | Status: DC
  Administered 2020-08-05 – 2020-08-06 (×4): 15 [IU] via SUBCUTANEOUS
  Filled 2020-08-05 (×6): qty 0.15

## 2020-08-05 NOTE — Progress Notes (Signed)
accucheck @ 2115 awas  418 MD Girgius made aware. New order to recheck FS  1 hour after lantus administration. Recheck fs 365. MD Girgius  order to medicate per sliding scale.5 units novolog given per sliding scale

## 2020-08-05 NOTE — Progress Notes (Signed)
PROGRESS NOTE    [name]  ZWC:[id number] DOB: 09-02-1965 DOA: 08/01/2020 PCP: [name], MD   Brief Narrative:  [name] is a 55 y.o. 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 Patient reports neuropathy of her hands and frequently gets injuries and burns to her fingers with multiple resultant infections. In the ED, she was febrile up to 101.6, mildly tachycardic with lactic acid of 5. Left hand x-ray shows osteolytic changes of the distal phalanx of the second digit.  She was also incidentally found to be Covid positive but denies any respiratory symptoms including shortness of breath, cough or chest pain.  No nausea, vomiting, diarrhea or abdominal pain. Patient was transferred to [facility] and taken immediately to the OR by hand surgeon Dr. 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 Roney Mans following - appreciate insight and recommendations - Status post partial amputation 08/01/2020; repeat I&D 08/03/2020 - Pain currently moderately well controlled on PRN  oxycodone -however given elevated use of Tylenol will transition to OxyContin twice daily dosing with as needed acetaminophen- patient does not have adequate pain control with morphine or Dilaudid IV. -Intraoperative culture consistent with MRSA -resistant to Cipro erythromycin and oxacillin. -Continue vancomycin, will discuss with ID/pharmacy possibility to transition to p.o. clindamycin or Bactrim in the next 24 to 48 hours pending clinical course for discharge planning. -Blood cultures reassuringly negative thus far -we will repeat blood cultures given worsening erythema and tracking on left hand and upper forearm more proximally today  Uncontrolled Type 2 diabetes with neuropathy -Continues to have moderate hyperglycemia -continue increasing Lantus as tolerated -A1c remains uncontrolled at 7.7 -Increase sliding scale, Lantus increased to 15 units twice daily- continue to advance during hospitalization as required. Patient will need to be discharged on insulin which will be a new medication for her -she remains skeptical that she will require insulin - Lengthy discussion about need for proper dietary compliance and medication compliance given worsening neuropathy and multiple episodes of osteomyelitis and infection -patient is under the impression that she does not truly have diabetes and her glucose is "well-controlled" throughout the year and is only high when she has an infection.  We discussed given her history with multiple amputations and the fact that she is on multiple p.o. diabetic medications clearly indicates that she does carry the diagnosis of diabetes despite having a normal A1c per her report in the outpatient setting..  Incidental COVID positive status, POA Cannot rule out acute viral infection although less likely -Patient remains asymptomatic, no known sick contacts, has been vaccinated x2, history of Covid infection October 2021, no booster  -Contact precautions  per protocol but  given patient's asymptomatic nature, unimpressive labs and imaging will not acutely treat patient.  Certainly if patient becomes symptomatic would consider steroids and Remdesivir but again less likely given above.  OSA Continue home CPAP unit  Morbid obesity BMI greater than 40 I DVT prophylaxis: SCDs only, early ambulation Code Status: Full Family Communication: None present  Status is: Inpatient  Dispo: The patient is from: Home              Anticipated d/c is to: Home              Anticipated d/c date is: 48 to 72 hours pending clinical course and cultures              Patient currently not medically stable for discharge given her recent surgical procedure, need for IV antibiotics and close monitoring.  Consultants:   Orthopedic surgery, Dr. Roney Mans  Procedures:   Partial amputation left index finger 08/01/2020; repeat I&D with washout on 08/03/2020  Antimicrobials:  Cefepime 08/01/2020 discontinued 08/02/2020 Vancomycin 08/01/2020, ongoing  Subjective: No acute issues or events overnight, pain somewhat poorly controlled this morning, increase medications as above, worsening erythema of the thenar eminence more proximately notable tracking posteriorly on the arm as well.  Objective: Vitals:   08/04/20 0545 08/04/20 1300 08/04/20 2144 08/05/20 0540  BP: 134/62 127/73 (!) 151/88 117/68  Pulse: 87 88 (!) 104 92  Resp: 18 20 18 18   Temp: 98.5 F (36.9 C) 98.7 F (37.1 C) 99.4 F (37.4 C) 98.5 F (36.9 C)  TempSrc: Oral Oral Oral Oral  SpO2: 99% 95% 100% 97%  Weight:      Height:        Intake/Output Summary (Last 24 hours) at 08/05/2020 0808 Last data filed at 08/04/2020 1300 Gross per 24 hour  Intake 480 ml  Output --  Net 480 ml   Filed Weights   08/01/20 1434  Weight: 123.4 kg    Examination:  General exam: Appears calm and comfortable  Respiratory system: Clear to auscultation. Respiratory effort normal. Cardiovascular system: S1 & S2 heard, RRR.  No JVD, murmurs, rubs, gallops or clicks. No pedal edema. Gastrointestinal system: Abdomen is nondistended, soft and nontender. No organomegaly or masses felt. Normal bowel sounds heard. Central nervous system: Alert and oriented. No focal neurological deficits. Extremities: Left BKA; multiple digits partially missing from previous partial amputations.  Left first finger wound without overt purulence, notable erythema on the left thenar eminence and possible erythematous tracking up the posterior aspects of the forearm. Psychiatry: Judgement and insight appear normal. Mood & affect appropriate.   Data Reviewed: I have personally reviewed following labs and imaging studies  CBC: Recent Labs  Lab 08/01/20 1503 08/02/20 0046 08/03/20 0403 08/04/20 0324 08/05/20 0410  WBC 8.8 9.2 7.9 7.8 6.7  NEUTROABS 7.1  --   --   --   --   HGB 10.4* 9.8* 9.2* 8.7* 9.2*  HCT 32.0* 30.4* 28.5* 27.7* 29.8*  MCV 91.2 95.0 94.1 94.9 96.1  PLT 208 202 204 221 244   Basic Metabolic Panel: Recent Labs  Lab 08/01/20 1503 08/02/20 0046 08/03/20 0403 08/04/20 0324 08/05/20 0410  NA 133* 134* 132* 133* 132*  K 4.1 4.2 4.1 3.8 4.0  CL 98 102 98 99 99  CO2 22 21* 24 25 24   GLUCOSE 452* 311* 375* 348* 331*  BUN 16 13 13 13 13   CREATININE 1.00 0.97 0.96 0.96 0.99  CALCIUM 9.5 8.8* 8.7* 8.4*  8.5*   GFR: Estimated Creatinine Clearance: 84.3 mL/min (by C-G formula based on SCr of 0.99 mg/dL). Liver Function Tests: Recent Labs  Lab 08/01/20 1503  AST 17  ALT 19  ALKPHOS 87  BILITOT 0.6  PROT 6.9  ALBUMIN 3.7   No results for input(s): LIPASE, AMYLASE in the last 168 hours. No results for input(s): AMMONIA in the last 168 hours. Coagulation Profile: No results for input(s): INR, PROTIME in the last 168 hours. Cardiac Enzymes: No results for input(s): CKTOTAL, CKMB, CKMBINDEX, TROPONINI in the last 168 hours. BNP (last 3 results) No results for input(s): PROBNP in the last 8760  hours. HbA1C: No results for input(s): HGBA1C in the last 72 hours. CBG: Recent Labs  Lab 08/03/20 2240 08/04/20 0718 08/04/20 1148 08/04/20 1631 08/04/20 2116  GLUCAP 440* 281* 394* 331* 375*   Lipid Profile: No results for input(s): CHOL, HDL, LDLCALC, TRIG, CHOLHDL, LDLDIRECT in the last 72 hours. Thyroid Function Tests: No results for input(s): TSH, T4TOTAL, FREET4, T3FREE, THYROIDAB in the last 72 hours. Anemia Panel: No results for input(s): VITAMINB12, FOLATE, FERRITIN, TIBC, IRON, RETICCTPCT in the last 72 hours. Sepsis Labs: Recent Labs  Lab 08/01/20 1503 08/01/20 1658 08/01/20 2230 08/02/20 0046  LATICACIDVEN 5.0* 3.3* 2.2* 2.5*    Recent Results (from the past 240 hour(s))  Blood culture (routine x 2)     Status: None (Preliminary result)   Collection Time: 08/01/20  2:48 PM   Specimen: BLOOD  Result Value Ref Range Status   Specimen Description BLOOD RIGHT ANTECUBITAL  Final   Special Requests   Final    BOTTLES DRAWN AEROBIC AND ANAEROBIC Blood Culture adequate volume   Culture   Final    NO GROWTH 4 DAYS Performed at [facility], [address] [place], [place] [address]    Report Status PENDING  Incomplete  Blood culture (routine x 2)     Status: None (Preliminary result)   Collection Time: 08/01/20  2:54 PM   Specimen: BLOOD  Result Value Ref Range Status   Specimen Description BLOOD LEFT ANTECUBITAL  Final   Special Requests   Final    BOTTLES DRAWN AEROBIC AND ANAEROBIC Blood Culture adequate volume   Culture   Final    NO GROWTH 4 DAYS Performed at [facility], [address] [place], [place] [address]    Report Status PENDING  Incomplete  Resp Panel by RT-PCR (Flu A&B, Covid) Nasopharyngeal Swab     Status: Abnormal   Collection Time: 08/01/20  4:06 PM   Specimen: Nasopharyngeal Swab; Nasopharyngeal(NP) swabs in vial transport medium  Result Value Ref Range Status   SARS Coronavirus 2 by RT PCR POSITIVE (A) NEGATIVE Final     Comment: RESULT CALLED TO, READ BACK BY AND VERIFIED WITH: Nelia ShiARA RENNICK RN [address] (NOTE) SARS-CoV-2 target nucleic acids are DETECTED.  The SARS-CoV-2 RNA is generally detectable in upper respiratory specimens during the acute phase of infection. Positive results are indicative of the presence of the identified virus, but do not rule out bacterial infection or co-infection with other pathogens not detected by the test. Clinical correlation with patient history and other diagnostic information is necessary to determine patient infection status. The expected result is Negative.  Fact Sheet for Patients: BloggerCourse.comhttps://www.fda.gov/media/152166/download  Fact Sheet for Healthcare Providers: SeriousBroker.ithttps://www.fda.gov/media/152162/download  This test is not yet approved or cleared by the [place] FDA and  has been authorized for detection and/or diagnosis of SARS-CoV-2 by FDA  under an Emergency Use Authorization (EUA).  This EUA will remain in effect (meaning this test can be used) for  the duration of  the COVID-19 declaration under Section 564(b)(1) of the Act, 21 U.S.C. section 360bbb-3(b)(1), unless the authorization is terminated or revoked sooner.     Influenza A by PCR NEGATIVE NEGATIVE Final   Influenza B by PCR NEGATIVE NEGATIVE Final    Comment: (NOTE) The Xpert Xpress SARS-CoV-2/FLU/RSV plus assay is intended as an aid in the diagnosis of influenza from Nasopharyngeal swab specimens and should not be used as a sole basis for treatment. Nasal washings and aspirates are unacceptable for Xpert Xpress SARS-CoV-2/FLU/RSV testing.  Fact Sheet for Patients: BloggerCourse.com  Fact Sheet for Healthcare Providers: SeriousBroker.it  This test is not yet approved or cleared by the [place] FDA and has been authorized for detection and/or diagnosis of SARS-CoV-2 by FDA under an Emergency Use Authorization (EUA). This EUA will  remain in effect (meaning this test can be used) for the duration of the COVID-19 declaration under Section 564(b)(1) of the Act, 21 U.S.C. section 360bbb-3(b)(1), unless the authorization is terminated or revoked.  Performed at [facility]   Aerobic/Anaerobic Culture w Gram Stain (surgical/deep wound)     Status: None (Preliminary result)   Collection Time: 08/01/20  7:13 PM   Specimen: Joint, Finger  Result Value Ref Range Status   Specimen Description   Final    FINGER LEFT INDEX Performed at [facility], [address] [place], [place] [address]    Special Requests   Final    NONE Performed at [facility], [address], [place], [place] [address]    Gram Stain   Final    NO WBC SEEN MODERATE GRAM POSITIVE COCCI Performed at [facility], [address] [place], [place] [address]    Culture   Final    ABUNDANT METHICILLIN RESISTANT STAPHYLOCOCCUS AUREUS NO ANAEROBES ISOLATED; CULTURE IN PROGRESS FOR 5 DAYS    Report Status PENDING  Incomplete   Organism ID, Bacteria METHICILLIN RESISTANT STAPHYLOCOCCUS AUREUS  Final      Susceptibility   Methicillin resistant staphylococcus aureus - MIC*    CIPROFLOXACIN >=8 RESISTANT Resistant     ERYTHROMYCIN >=8 RESISTANT Resistant     GENTAMICIN <=0.5 SENSITIVE Sensitive     OXACILLIN >=4 RESISTANT Resistant     TETRACYCLINE <=1 SENSITIVE Sensitive     VANCOMYCIN 1 SENSITIVE Sensitive     TRIMETH/SULFA <=10 SENSITIVE Sensitive     CLINDAMYCIN <=0.25 SENSITIVE Sensitive     RIFAMPIN <=0.5 SENSITIVE Sensitive     Inducible Clindamycin NEGATIVE Sensitive     * ABUNDANT METHICILLIN RESISTANT STAPHYLOCOCCUS AUREUS  Surgical pcr screen     Status: Abnormal   Collection Time: 08/03/20  8:06 AM   Specimen: Nasal Mucosa; Nasal Swab  Result Value Ref Range Status   MRSA, PCR POSITIVE (A) NEGATIVE Final    Comment: RESULT CALLED TO, READ BACK BY AND VERIFIED  WITH: BULLINS,H. RN AT 1049 08/03/20 MULLINS,T    Staphylococcus aureus POSITIVE (A) NEGATIVE Final    Comment: (NOTE) The Xpert SA Assay (FDA approved for NASAL specimens in patients 102 years of age and older), is one component of a comprehensive surveillance program. It is not intended to diagnose infection nor to guide or monitor treatment. Performed at [facility], [address], [place], [place] [address]   Aerobic/Anaerobic Culture w Gram Stain (surgical/deep wound)  Status: None (Preliminary result)   Collection Time: 08/03/20  9:47 AM   Specimen: Finger, Left; Wound  Result Value Ref Range Status   Specimen Description   Final    FINGER LEFT Performed at [facility], [address] [place], [place] [address]    Special Requests   Final    NONE Performed at [facility], [address] [place], [place] [address]    Gram Stain   Final    FEW WBC PRESENT, PREDOMINANTLY PMN NO ORGANISMS SEEN Performed at [facility], [address] [place], [place] [address]    Culture FEW STAPHYLOCOCCUS AUREUS  Final   Report Status PENDING  Incomplete         Radiology Studies: No results found. Scheduled Meds: . Chlorhexidine Gluconate Cloth  6 each Topical Q0600  . DULoxetine  60 mg Oral Daily  . gabapentin  300 mg Oral BID  . insulin aspart  0-15 Units Subcutaneous TID WC  . insulin aspart  0-5 Units Subcutaneous QHS  . insulin glargine  10 Units Subcutaneous BID  . mupirocin ointment  1 application Nasal BID  . oxybutynin  10 mg Oral Daily   Continuous Infusions: . sodium chloride 250 mL (08/01/20 1546)  . vancomycin 1,500 mg (08/04/20 1538)     LOS: 4 days   Time spent:  Azucena Fallen, DO [facility]  If 7PM-7AM, please contact night-coverage [email or web address]  08/05/2020, 8:08 AM

## 2020-08-05 NOTE — TOC Progression Note (Signed)
Transition of Care [facility]) - Progression Note    Patient Details  Name: [name] MRN: [id number] Date of Birth: Jun 18, 1965  Transition [facility] CM/SW Contact  Geni Bers, RN Phone Number: 08/05/2020, 2:02 PM  Clinical Narrative:     Pt from home with spouse. TOC will continue to follow.   Expected Discharge Plan: Home/Self Care Barriers to Discharge: No Barriers Identified  Expected Discharge Plan and Services Expected Discharge Plan: Home/Self Care       Living arrangements for the past 2 months: Single Family Home                                       Social Determinants of Health (SDOH) Interventions    Readmission Risk Interventions No flowsheet data found.

## 2020-08-05 NOTE — Plan of Care (Signed)
  Problem: Clinical Measurements: Goal: Will remain free from infection Outcome: Progressing Goal: Diagnostic test results will improve Outcome: Progressing   Problem: Activity: Goal: Risk for activity intolerance will decrease Outcome: Progressing   Problem: Pain Managment: Goal: General experience of comfort will improve Outcome: Progressing   Problem: Nutrition: Goal: Adequate nutrition will be maintained Outcome: Adequate for Discharge   Problem: Elimination: Goal: Will not experience complications related to bowel motility Outcome: Adequate for Discharge Goal: Will not experience complications related to urinary retention Outcome: Adequate for Discharge

## 2020-08-05 NOTE — Progress Notes (Signed)
   Ortho Hand Progress Note  Subjective: No acute events last night. Afebrile. Increased errythema and streaking to the left hand today  Objective: Vital signs in last 24 hours: Temp:  [98.5 F (36.9 C)-99.4 F (37.4 C)] 98.6 F (37 C) (04/11 1333) Pulse Rate:  [71-104] 71 (04/11 1333) Resp:  [18] 18 (04/11 1333) BP: (117-151)/(63-88) 122/63 (04/11 1333) SpO2:  [94 %-100 %] 94 % (04/11 1333)  Intake/Output from previous day: 04/10 0701 - 04/11 0700 In: 480 [P.O.:480] Out: -  Intake/Output this shift: Total I/O In: 240 [P.O.:240] Out: -   Recent Labs    08/03/20 0403 08/04/20 0324 08/05/20 0410  HGB 9.2* 8.7* 9.2*   Recent Labs    08/04/20 0324 08/05/20 0410  WBC 7.8 6.7  RBC 2.92* 3.10*  HCT 27.7* 29.8*  PLT 221 244   Recent Labs    08/04/20 0324 08/05/20 0410  NA 133* 132*  K 3.8 4.0  CL 99 99  CO2 25 24  BUN 13 13  CREATININE 0.96 0.99  GLUCOSE 348* 331*  CALCIUM 8.4* 8.5*   No results for input(s): LABPT, INR in the last 72 hours.  Aaox3 nad Resp nonlabored RRR LUE: partial amputation of the L IF. Increased errythema  In the palm and streaking up the dorsal aspect of the hand. Tenderness to palpation at the base of the thumb and first web space. No purulence noted from the index finger tip.   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 DOS 4/7. Repeat I&D on 4/9  - Hospitalists primary. Appreciate management - Cxs MRSA - BID dressing changes. Encourage IF flex/ex - continue Abx - Increased pain, swelling and errythema to the left hand - Will obtain MRI left hand to see if there are additional foci of infection which need I&D - Provisionally added on for OR tomorrow afternoon for repeat I&D - Remainder per primary    [name] 08/05/2020, 4:20 PM  [phone]

## 2020-08-05 NOTE — Progress Notes (Signed)
Pharmacy Antibiotic Note  See previous note from Lynann Beaver, PharmD for details.  Vancomycin peak = 38 mcg/ml, trough = 7 mcg/ml Calculated AUC = 425  Plan:  Change vancomycin to 1g IV q12h - next dose tomorrow at 10:00  Estimated AUC 567, Cmin 14.7, Cmax 35.3  Will recheck levels at new steady state if therapy continues  Loralee Pacas, PharmD, BCPS Pharmacy: [phone] 08/05/2020 7:10 PM

## 2020-08-05 NOTE — Progress Notes (Signed)
Pharmacy Antibiotic Note  [name] is a 55 y.o. female admitted on 08/01/2020. 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 Pt currently on antibiotics for wound/OM of left index finger. Wound culture growing MRSA. Surgery: -4/7: I&D w/partial amputation -4/9: repeat I&D  Today, 08/05/20  Day # 5 vancomycin  WBC WNL  SCr 0.99 - stable, WNL.  Plan:  Continue vancomycin 1500 mg IV q24h  Goal vancomycin AUC 400-550  Check vancomycin levels today.   Height: 5\' 4"  (162.6 cm) Weight: 123.4 kg (272 lb) IBW/kg (Calculated) : 54.7  Temp (24hrs), Avg:98.9 F (37.2 C), Min:98.5 F (36.9 C), Max:99.4 F (37.4 C)  Recent Labs  Lab 08/01/20 1503 08/01/20 1658 08/01/20 2230 08/02/20 0046 08/03/20 0403 08/04/20 0324 08/05/20 0410  WBC 8.8  --   --  9.2 7.9 7.8 6.7  CREATININE 1.00  --   --  0.97 0.96 0.96 0.99  LATICACIDVEN 5.0* 3.3* 2.2* 2.5*  --   --   --     Estimated Creatinine Clearance: 84.3 mL/min (by C-G formula based on SCr of 0.99 mg/dL).    Allergies  Allergen Reactions  . Rocephin [Ceftriaxone] Shortness Of Breath and Swelling    Tolerated course of Rocephin 6/19 and multiple courses of Ancef/Keflex as well as Cefepime  . Daptomycin Other (See Comments)    Chills and lower back pain; Patient states had issues due to this medication and Vancomycin ran at the same time; Patient states related to infected PICC Line   . Vancomycin Other (See Comments)    Drives creatinine level(s) "through the roof" and they didn't return to normal for approx 14 days Reaction happened in march, has since had vancomycin without problem. Patient states was related to infected PICC Line; after PICC changed no problems.    Antimicrobials this admission: 4/7 vanc >> 4/7 cefepime >> 4/8  Dose adjustments this admission:  Microbiology results: 4/7 BCx: ngtd 4/7 wound : Abundant MRSA 4/9 surgical deep  wound: Few MRSA  Thank you for allowing pharmacy to be a part of this patient's care.  6/9 PharmD, BCPS Clinical Pharmacist [facility] main pharmacy [phone] 08/05/2020 11:59 AM

## 2020-08-06 ENCOUNTER — Encounter (HOSPITAL_COMMUNITY): Payer: Self-pay | Admitting: Orthopaedic Surgery

## 2020-08-06 ENCOUNTER — Ambulatory Visit (HOSPITAL_COMMUNITY)
Admit: 2020-08-06 | Discharge: 2020-08-06 | Disposition: A | Discharge status: Home / Self Care | Payer: 59 | Attending: Orthopaedic Surgery | Admitting: Orthopaedic Surgery

## 2020-08-06 ENCOUNTER — Encounter (HOSPITAL_COMMUNITY)
Admission: EM | Disposition: A | Discharge status: Home / Self Care | Payer: Self-pay | Source: Home / Self Care | Attending: Internal Medicine

## 2020-08-06 ENCOUNTER — Inpatient Hospital Stay (HOSPITAL_COMMUNITY): Payer: 59 | Admitting: Certified Registered Nurse Anesthetist

## 2020-08-06 ENCOUNTER — Other Ambulatory Visit (HOSPITAL_COMMUNITY): Payer: Self-pay

## 2020-08-06 HISTORY — PX: I & D EXTREMITY: SHX5045

## 2020-08-06 LAB — BASIC METABOLIC PANEL
Anion gap: 10 (ref 5–15)
BUN: 14 mg/dL (ref 6–20)
CO2: 25 mmol/L (ref 22–32)
Calcium: 8.7 mg/dL — ABNORMAL LOW (ref 8.9–10.3)
Chloride: 102 mmol/L (ref 98–111)
Creatinine, Ser: 1 mg/dL (ref 0.44–1.00)
GFR, Estimated: >60 mL/min (ref >60)
Glucose, Bld: 249 mg/dL — ABNORMAL HIGH (ref 70–99)
Potassium: 3.8 mmol/L (ref 3.5–5.1)
Sodium: 137 mmol/L (ref 135–145)

## 2020-08-06 LAB — CBC
HCT: 27.3 % — ABNORMAL LOW (ref 36.0–46.0)
Hemoglobin: 8.7 g/dL — ABNORMAL LOW (ref 12.0–15.0)
MCH: 30 pg (ref 26.0–34.0)
MCHC: 31.9 g/dL (ref 30.0–36.0)
MCV: 94.1 fL (ref 80.0–100.0)
Platelets: 242 10*3/uL (ref 150–400)
RBC: 2.9 MIL/uL — ABNORMAL LOW (ref 3.87–5.11)
RDW: 14.6 % (ref 11.5–15.5)
WBC: 7.4 10*3/uL (ref 4.0–10.5)
nRBC: 0.4 % — ABNORMAL HIGH (ref 0.0–0.2)

## 2020-08-06 LAB — GLUCOSE, CAPILLARY
Glucose-Capillary: 250 mg/dL — ABNORMAL HIGH (ref 70–99)
Glucose-Capillary: 223 mg/dL — ABNORMAL HIGH (ref 70–99)
Glucose-Capillary: 178 mg/dL — ABNORMAL HIGH (ref 70–99)
Glucose-Capillary: 203 mg/dL — ABNORMAL HIGH (ref 70–99)
Glucose-Capillary: 268 mg/dL — ABNORMAL HIGH (ref 70–99)

## 2020-08-06 LAB — CULTURE, BLOOD (ROUTINE X 2)
Culture: NO GROWTH
Culture: NO GROWTH
Special Requests: ADEQUATE
Special Requests: ADEQUATE

## 2020-08-06 SURGERY — IRRIGATION AND DEBRIDEMENT EXTREMITY
Anesthesia: General | Site: Hand | Laterality: Left

## 2020-08-06 MED ORDER — OXYCODONE HCL ER 20 MG PO T12A
20.0000 mg | EXTENDED_RELEASE_TABLET | Freq: Two times a day (BID) | ORAL | Status: DC
  Administered 2020-08-06 – 2020-08-09 (×6): 20 mg via ORAL
  Filled 2020-08-06 (×6): qty 1

## 2020-08-06 MED ORDER — ONDANSETRON HCL 4 MG/2ML IJ SOLN
INTRAMUSCULAR | Status: AC
  Filled 2020-08-06: qty 2

## 2020-08-06 MED ORDER — ONDANSETRON HCL 4 MG/2ML IJ SOLN
INTRAMUSCULAR | Status: DC | PRN
  Administered 2020-08-06: 4 mg via INTRAVENOUS

## 2020-08-06 MED ORDER — POVIDONE-IODINE 10 % EX SWAB: 2.0000 "application " | Freq: Once | CUTANEOUS | Status: DC

## 2020-08-06 MED ORDER — DEXAMETHASONE SODIUM PHOSPHATE 10 MG/ML IJ SOLN
INTRAMUSCULAR | Status: DC | PRN
  Administered 2020-08-06: 5 mg via INTRAVENOUS

## 2020-08-06 MED ORDER — OXYCODONE HCL 5 MG PO TABS
5.0000 mg | ORAL_TABLET | Freq: Once | ORAL | Status: DC | PRN
Start: 2020-08-06 — End: 2020-08-06

## 2020-08-06 MED ORDER — 0.9 % SODIUM CHLORIDE (POUR BTL) OPTIME
TOPICAL | Status: DC | PRN
  Administered 2020-08-06 (×2): 1000 mL

## 2020-08-06 MED ORDER — LIDOCAINE 2% (20 MG/ML) 5 ML SYRINGE
INTRAMUSCULAR | Status: AC
  Filled 2020-08-06: qty 5

## 2020-08-06 MED ORDER — SODIUM CHLORIDE 0.9 % IR SOLN
Status: DC | PRN
  Administered 2020-08-06: 1000 mL
  Administered 2020-08-06: 3000 mL

## 2020-08-06 MED ORDER — LIDOCAINE HCL (CARDIAC) PF 100 MG/5ML IV SOSY
PREFILLED_SYRINGE | INTRAVENOUS | Status: DC | PRN
  Administered 2020-08-06: 100 mg via INTRAVENOUS

## 2020-08-06 MED ORDER — OXYCODONE HCL 5 MG/5ML PO SOLN: 5.0000 mg | Freq: Once | ORAL | Status: DC | PRN

## 2020-08-06 MED ORDER — ACETAMINOPHEN 325 MG PO TABS: 325.0000 mg | ORAL_TABLET | ORAL | Status: DC | PRN

## 2020-08-06 MED ORDER — ONDANSETRON HCL 4 MG/2ML IJ SOLN: 4.0000 mg | Freq: Once | INTRAMUSCULAR | Status: DC | PRN

## 2020-08-06 MED ORDER — CHLORHEXIDINE GLUCONATE 4 % EX LIQD
60.0000 mL | Freq: Once | CUTANEOUS | Status: DC
  Filled 2020-08-06: qty 60

## 2020-08-06 MED ORDER — MIDAZOLAM HCL 2 MG/2ML IJ SOLN
INTRAMUSCULAR | Status: DC | PRN
  Administered 2020-08-06: 2 mg via INTRAVENOUS

## 2020-08-06 MED ORDER — LACTATED RINGERS IV SOLN: INTRAVENOUS | Status: DC | PRN

## 2020-08-06 MED ORDER — FENTANYL CITRATE (PF) 100 MCG/2ML IJ SOLN
INTRAMUSCULAR | Status: AC
  Filled 2020-08-06: qty 2

## 2020-08-06 MED ORDER — FENTANYL CITRATE (PF) 100 MCG/2ML IJ SOLN: 25.0000 ug | INTRAMUSCULAR | Status: DC | PRN

## 2020-08-06 MED ORDER — PROPOFOL 10 MG/ML IV BOLUS
INTRAVENOUS | Status: DC | PRN
  Administered 2020-08-06: 200 mg via INTRAVENOUS

## 2020-08-06 MED ORDER — OXYCODONE HCL ER 10 MG PO T12A
10.0000 mg | EXTENDED_RELEASE_TABLET | Freq: Once | ORAL | Status: AC
  Administered 2020-08-06: 10 mg via ORAL
  Filled 2020-08-06: qty 1

## 2020-08-06 MED ORDER — ACETAMINOPHEN 160 MG/5ML PO SOLN: 325.0000 mg | ORAL | Status: DC | PRN

## 2020-08-06 MED ORDER — FENTANYL CITRATE (PF) 100 MCG/2ML IJ SOLN
INTRAMUSCULAR | Status: DC | PRN
  Administered 2020-08-06 (×2): 50 ug via INTRAVENOUS

## 2020-08-06 MED ORDER — MEPERIDINE HCL 50 MG/ML IJ SOLN
6.2500 mg | INTRAMUSCULAR | Status: DC | PRN
Start: 2020-08-06 — End: 2020-08-06

## 2020-08-06 MED ORDER — MIDAZOLAM HCL 2 MG/2ML IJ SOLN
INTRAMUSCULAR | Status: AC
  Filled 2020-08-06: qty 2

## 2020-08-06 MED ORDER — DEXAMETHASONE SODIUM PHOSPHATE 10 MG/ML IJ SOLN
INTRAMUSCULAR | Status: AC
  Filled 2020-08-06: qty 1

## 2020-08-06 SURGICAL SUPPLY — 35 items
BAG ZIPLOCK 12X15 (MISCELLANEOUS) ×2 IMPLANT
BNDG COHESIVE 4X5 TAN STRL (GAUZE/BANDAGES/DRESSINGS) ×2 IMPLANT
BNDG ELASTIC 4X5.8 VLCR STR LF (GAUZE/BANDAGES/DRESSINGS) ×2 IMPLANT
BNDG GAUZE ELAST 4 BULKY (GAUZE/BANDAGES/DRESSINGS) ×2 IMPLANT
COVER MAYO STAND STRL (DRAPES) ×2 IMPLANT
COVER SURGICAL LIGHT HANDLE (MISCELLANEOUS) ×2 IMPLANT
COVER WAND RF STERILE (DRAPES) IMPLANT
CUFF TOURN SGL QUICK 18X4 (TOURNIQUET CUFF) ×2 IMPLANT
CUFF TOURN SGL QUICK 24 (TOURNIQUET CUFF)
CUFF TRNQT CYL 24X4X16.5-23 (TOURNIQUET CUFF) IMPLANT
DRAPE U-SHAPE 47X51 STRL (DRAPES) ×2 IMPLANT
DRSG PAD ABDOMINAL 8X10 ST (GAUZE/BANDAGES/DRESSINGS) ×2 IMPLANT
ELECT REM PT RETURN 15FT ADLT (MISCELLANEOUS) ×2 IMPLANT
EVACUATOR 1/8 PVC DRAIN (DRAIN) ×2 IMPLANT
GAUZE SPONGE 4X4 12PLY STRL (GAUZE/BANDAGES/DRESSINGS) ×2 IMPLANT
GAUZE XEROFORM 1X8 LF (GAUZE/BANDAGES/DRESSINGS) ×2 IMPLANT
GLOVE SRG 8 PF TXTR STRL LF DI (GLOVE) ×1 IMPLANT
GLOVE SURG SYN 7.5  E (GLOVE) ×2
GLOVE SURG SYN 7.5 E (GLOVE) ×1 IMPLANT
GLOVE SURG UNDER POLY LF SZ8 (GLOVE) ×2
KIT BASIN OR (CUSTOM PROCEDURE TRAY) ×2 IMPLANT
KIT TURNOVER KIT A (KITS) ×2 IMPLANT
NEEDLE HYPO 22GX1.5 SAFETY (NEEDLE) ×2 IMPLANT
NS IRRIG 1000ML POUR BTL (IV SOLUTION) ×2 IMPLANT
PACK ORTHO EXTREMITY (CUSTOM PROCEDURE TRAY) ×2 IMPLANT
PENCIL SMOKE EVACUATOR (MISCELLANEOUS) IMPLANT
PROTECTOR NERVE ULNAR (MISCELLANEOUS) ×2 IMPLANT
SET IRRIG Y TYPE TUR BLADDER L (SET/KITS/TRAYS/PACK) ×2 IMPLANT
SPLINT FIBERGLASS 4X30 (CAST SUPPLIES) ×2 IMPLANT
STOCKINETTE 8 INCH (MISCELLANEOUS) ×2 IMPLANT
SUT MNCRL AB 3-0 PS2 18 (SUTURE) ×4 IMPLANT
SUT PROLENE 4 0 PS 2 18 (SUTURE) ×4 IMPLANT
SWAB COLLECTION DEVICE MRSA (MISCELLANEOUS) IMPLANT
SWAB CULTURE ESWAB REG 1ML (MISCELLANEOUS) IMPLANT
SYR CONTROL 10ML LL (SYRINGE) ×2 IMPLANT

## 2020-08-06 NOTE — Anesthesia Postprocedure Evaluation (Signed)
Anesthesia Post Note  Patient: [name]  Procedure(s) Performed: IRRIGATION AND DEBRIDEMENT EXTREMITY (Left Hand)     Patient location during evaluation: PACU Anesthesia Type: General Level of consciousness: awake and alert Pain management: pain level controlled Vital Signs Assessment: post-procedure vital signs reviewed and stable Respiratory status: spontaneous breathing, nonlabored ventilation, respiratory function stable and patient connected to nasal cannula oxygen Cardiovascular status: blood pressure returned to baseline and stable Postop Assessment: no apparent nausea or vomiting Anesthetic complications: no   No complications documented.  Last Vitals:  Vitals:   08/06/20 0512 08/06/20 1638  BP: 132/60 124/63  Pulse: 92 85  Resp: 18 18  Temp: (!) 38.2 C 36.9 C  SpO2: 94% 99%    Last Pain:  Vitals:   08/06/20 1638  TempSrc: Oral  PainSc:                  [name]

## 2020-08-06 NOTE — Progress Notes (Signed)
PROGRESS NOTE    [name]  KGU:[id number] DOB: [birth year] DOA: 08/01/2020 PCP: [name], MD   Brief Narrative:  [name] is a 55 y.o. 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 Patient reports neuropathy of her hands and frequently gets injuries and burns to her fingers with multiple resultant infections. In the ED, she was febrile up to 101.6, mildly tachycardic with lactic acid of 5. Left hand x-ray shows osteolytic changes of the distal phalanx of the second digit.  She was also incidentally found to be Covid positive but denies any respiratory symptoms including shortness of breath, cough or chest pain.  No nausea, vomiting, diarrhea or abdominal pain. Patient was transferred to [facility] and taken immediately to the OR by hand surgeon Dr. 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 Roney Mans following - appreciate insight and recommendations - Status post partial amputation 08/01/2020; repeat I&D 08/03/2020 -MRI pending of the left hand to further evaluate for  possible third procedure today 08/06/2020 -appreciate Ortho insight and recommendations - Pain currently moderately well controlled on PRN oxycodone -however given elevated use of Tylenol will transition to OxyContin twice daily dosing with as needed acetaminophen- patient does not have adequate pain control with morphine or Dilaudid IV. -Intraoperative culture consistent with MRSA -resistant to Cipro, erythromycin, and oxacillin. -Continue vancomycin, will discuss with ID/pharmacy possibility to transition to p.o. clindamycin or Bactrim in the next 24 to 48 hours pending clinical course for discharge planning once we have adequate source control. -Blood cultures reassuringly negative thus far -we will repeat blood cultures given worsening erythema and tracking on left hand and upper forearm more proximally over the past 48 hours  Uncontrolled Type 2 diabetes with neuropathy -Continues to have moderate hyperglycemia -continue increasing Lantus as tolerated -A1c remains uncontrolled at 7.7 -Increase sliding scale, Lantus increased to 15 units twice daily- continue to advance during hospitalization as required. Patient will need to be discharged on insulin which will be a new medication for her -she remains skeptical that she will require insulin - Lengthy discussion about need for proper dietary compliance and medication compliance given worsening neuropathy and multiple episodes of osteomyelitis and infection -patient is under the impression that she does not truly have diabetes and her glucose is "well-controlled" throughout the year and is only high when she has an infection.  We discussed given her history with multiple amputations and the fact that she is on multiple p.o. diabetic medications clearly indicates that she does carry the diagnosis of diabetes despite having a normal A1c per her report in the outpatient setting..  Incidental COVID positive status, POA Cannot rule out  acute viral  infection although less likely -Patient remains asymptomatic, no known sick contacts, has been vaccinated x2, history of Covid infection October 2021, no booster  -Contact precautions per protocol but given patient's asymptomatic nature, unimpressive labs and imaging will not acutely treat patient.  Certainly if patient becomes symptomatic would consider steroids and Remdesivir but again less likely given above.  OSA Continue home CPAP unit  Morbid obesity BMI greater than 40 I DVT prophylaxis: SCDs only, early ambulation Code Status: Full Family Communication: None present  Status is: Inpatient  Dispo: The patient is from: Home              Anticipated d/c is to: Home              Anticipated d/c date is: 48 to 72 hours pending clinical course and cultures              Patient currently not medically stable for discharge given her recent surgical procedure, need for IV antibiotics and close monitoring.  Consultants:   Orthopedic surgery, Dr. Roney Mans  Procedures:   Partial amputation left index finger 08/01/2020; repeat I&D with washout on 08/03/2020  Tentatively planned procedure on 08/06/2020 pending MRI -likely further debridement and washout per discussion with Ortho this morning  Antimicrobials:  Cefepime 08/01/2020 discontinued 08/02/2020 Vancomycin 08/01/2020, ongoing  Subjective: Left hand swelling, erythema, pain continued to worsen over the past 24 hours.  Otherwise in good spirits denies nausea vomiting diarrhea constipation headache fevers or chills.  Objective: Vitals:   08/05/20 0540 08/05/20 1333 08/05/20 2136 08/06/20 0512  BP: 117/68 122/63 (!) 131/57 132/60  Pulse: 92 71 89 92  Resp: Temp: 98.5 F (36.9 C) 98.6 F (37 C) 99.7 F (37.6 C) (!) 100.8 F (38.2 C)  TempSrc: Oral Oral Oral Oral  SpO2: 97% 94% 99% 94%  Weight:      Height:        Intake/Output Summary (Last 24 hours) at 08/06/2020 0802 Last data filed at 08/05/2020  0900 Gross per 24 hour  Intake 240 ml  Output --  Net 240 ml   Filed Weights   08/01/20 1434  Weight: 123.4 kg    Examination:  General exam: Appears calm and comfortable  Respiratory system: Clear to auscultation. Respiratory effort normal. Cardiovascular system: S1 & S2 heard, RRR. No JVD, murmurs, rubs, gallops or clicks. No pedal edema. Gastrointestinal system: Abdomen is nondistended, soft and nontender. No organomegaly or masses felt. Normal bowel sounds heard. Central nervous system: Alert and oriented. No focal neurological deficits. Extremities: Left BKA; multiple digits partially missing from previous partial amputations.  Left first finger wound without overt purulence, notable erythema on the left thenar eminence and possible erythematous tracking up the posterior aspects of the forearm. Psychiatry: Judgement and insight appear normal. Mood & affect appropriate.   Data Reviewed: I have personally reviewed following labs and imaging studies  CBC: Recent Labs  Lab 08/01/20 1503 08/02/20 0046 08/03/20 0403 08/04/20 0324 08/05/20 0410  WBC 8.8 9.2 7.9 7.8 6.7  NEUTROABS 7.1  --   --   --   --   HGB 10.4* 9.8* 9.2* 8.7* 9.2*  HCT 32.0* 30.4* 28.5* 27.7* 29.8*  MCV 91.2 95.0 94.1 94.9 96.1  PLT 208 202 204 221 244   Basic Metabolic Panel: Recent Labs  Lab 08/01/20 1503 08/02/20 0046 08/03/20 0403 08/04/20 0324 08/05/20 0410 08/05/20 2241  NA 133* 134* 132* 133* 132*  --  K 4.1 4.2 4.1 3.8 4.0  --   CL 98 102 98 99 99  --   CO2 22 21* 24 25 24   --   GLUCOSE 452* 311* 375* 348* 331* 436*  BUN 16 13 13 13 13   --   CREATININE 1.00 0.97 0.96 0.96 0.99  --   CALCIUM 9.5 8.8* 8.7* 8.4* 8.5*  --    GFR: Estimated Creatinine Clearance: 84.3 mL/min (by C-G formula based on SCr of 0.99 mg/dL). Liver Function Tests: Recent Labs  Lab 08/01/20 1503  AST 17  ALT 19  ALKPHOS 87  BILITOT 0.6  PROT 6.9  ALBUMIN 3.7   No results for input(s): LIPASE, AMYLASE  in the last 168 hours. No results for input(s): AMMONIA in the last 168 hours. Coagulation Profile: No results for input(s): INR, PROTIME in the last 168 hours. Cardiac Enzymes: No results for input(s): CKTOTAL, CKMB, CKMBINDEX, TROPONINI in the last 168 hours. BNP (last 3 results) No results for input(s): PROBNP in the last 8760 hours. HbA1C: No results for input(s): HGBA1C in the last 72 hours. CBG: Recent Labs  Lab 08/05/20 1155 08/05/20 1640 08/05/20 2115 08/05/20 2240 08/06/20 0741  GLUCAP 252* 298* 418* 365* 250*   Lipid Profile: No results for input(s): CHOL, HDL, LDLCALC, TRIG, CHOLHDL, LDLDIRECT in the last 72 hours. Thyroid Function Tests: No results for input(s): TSH, T4TOTAL, FREET4, T3FREE, THYROIDAB in the last 72 hours. Anemia Panel: No results for input(s): VITAMINB12, FOLATE, FERRITIN, TIBC, IRON, RETICCTPCT in the last 72 hours. Sepsis Labs: Recent Labs  Lab 08/01/20 1503 08/01/20 1658 08/01/20 2230 08/02/20 0046  LATICACIDVEN 5.0* 3.3* 2.2* 2.5*    Recent Results (from the past 240 hour(s))  Blood culture (routine x 2)     Status: None (Preliminary result)   Collection Time: 08/01/20  2:48 PM   Specimen: BLOOD  Result Value Ref Range Status   Specimen Description BLOOD RIGHT ANTECUBITAL  Final   Special Requests   Final    BOTTLES DRAWN AEROBIC AND ANAEROBIC Blood Culture adequate volume   Culture   Final    NO GROWTH 4 DAYS Performed at [facility], [address] [place], [place] [address]    Report Status PENDING  Incomplete  Blood culture (routine x 2)     Status: None (Preliminary result)   Collection Time: 08/01/20  2:54 PM   Specimen: BLOOD  Result Value Ref Range Status   Specimen Description BLOOD LEFT ANTECUBITAL  Final   Special Requests   Final    BOTTLES DRAWN AEROBIC AND ANAEROBIC Blood Culture adequate volume   Culture   Final    NO GROWTH 4 DAYS Performed at [facility], [address] [place], [place]  [id number]    Report Status PENDING  Incomplete  Resp Panel by RT-PCR (Flu A&B, Covid) Nasopharyngeal Swab     Status: Abnormal   Collection Time: 08/01/20  4:06 PM   Specimen: Nasopharyngeal Swab; Nasopharyngeal(NP) swabs in vial transport medium  Result Value Ref Range Status   SARS Coronavirus 2 by RT PCR POSITIVE (A) NEGATIVE Final    Comment: RESULT CALLED TO, READ BACK BY AND VERIFIED WITH: Nelia ShiARA RENNICK RN [address] [place] (NOTE) SARS-CoV-2 target nucleic acids are DETECTED.  The SARS-CoV-2 RNA is generally detectable in upper respiratory specimens during the acute phase of infection. Positive results are indicative of the presence of the identified virus, but do not rule out bacterial infection or co-infection with other pathogens  not detected by the test. Clinical correlation with patient history and other diagnostic information is necessary to determine patient infection status. The expected result is Negative.  Fact Sheet for Patients: BloggerCourse.com  Fact Sheet for Healthcare Providers: SeriousBroker.it  This test is not yet approved or cleared by the [place] FDA and  has been authorized for detection and/or diagnosis of SARS-CoV-2 by FDA under an Emergency Use Authorization (EUA).  This EUA will remain in effect (meaning this test can be used) for  the duration of  the COVID-19 declaration under Section 564(b)(1) of the Act, 21 U.S.C. section 360bbb-3(b)(1), unless the authorization is terminated or revoked sooner.     Influenza A by PCR NEGATIVE NEGATIVE Final   Influenza B by PCR NEGATIVE NEGATIVE Final    Comment: (NOTE) The Xpert Xpress SARS-CoV-2/FLU/RSV plus assay is intended as an aid in the diagnosis of influenza from Nasopharyngeal swab specimens and should not be used as a sole basis for treatment. Nasal washings and aspirates are unacceptable for Xpert Xpress SARS-CoV-2/FLU/RSV testing.  Fact Sheet for  Patients: BloggerCourse.com  Fact Sheet for Healthcare Providers: SeriousBroker.it  This test is not yet approved or cleared by the [place] FDA and has been authorized for detection and/or diagnosis of SARS-CoV-2 by FDA under an Emergency Use Authorization (EUA). This EUA will remain in effect (meaning this test can be used) for the duration of the COVID-19 declaration under Section 564(b)(1) of the Act, 21 U.S.C. section 360bbb-3(b)(1), unless the authorization is terminated or revoked.  Performed at [facility]   Aerobic/Anaerobic Culture w Gram Stain (surgical/deep wound)     Status: None (Preliminary result)   Collection Time: 08/01/20  7:13 PM   Specimen: Joint, Finger  Result Value Ref Range Status   Specimen Description   Final    FINGER LEFT INDEX Performed at [facility], [address], [place], [place] [address]    Special Requests   Final    NONE Performed at [facility], [address], [place], [place] [address]    Gram Stain   Final    NO WBC SEEN MODERATE GRAM POSITIVE COCCI Performed at [facility], [address], [place], [place] [address]    Culture   Final    ABUNDANT METHICILLIN RESISTANT STAPHYLOCOCCUS AUREUS NO ANAEROBES ISOLATED; CULTURE IN PROGRESS FOR 5 DAYS    Report Status PENDING  Incomplete   Organism ID, Bacteria METHICILLIN RESISTANT STAPHYLOCOCCUS AUREUS  Final      Susceptibility   Methicillin resistant staphylococcus aureus - MIC*    CIPROFLOXACIN >=8 RESISTANT Resistant     ERYTHROMYCIN >=8 RESISTANT Resistant     GENTAMICIN <=0.5 SENSITIVE Sensitive     OXACILLIN >=4 RESISTANT Resistant     TETRACYCLINE <=1 SENSITIVE Sensitive     VANCOMYCIN 1 SENSITIVE Sensitive     TRIMETH/SULFA <=10 SENSITIVE Sensitive     CLINDAMYCIN <=0.25 SENSITIVE Sensitive     RIFAMPIN <=0.5 SENSITIVE Sensitive     Inducible Clindamycin  NEGATIVE Sensitive     * ABUNDANT METHICILLIN RESISTANT STAPHYLOCOCCUS AUREUS  Surgical pcr screen     Status: Abnormal   Collection Time: 08/03/20  8:06 AM   Specimen: Nasal Mucosa; Nasal Swab  Result Value Ref Range Status   MRSA, PCR POSITIVE (A) NEGATIVE Final    Comment: RESULT CALLED TO, READ BACK BY AND VERIFIED WITH: BULLINS,H. RN AT 1049 08/03/20 MULLINS,T    Staphylococcus aureus POSITIVE (A) NEGATIVE Final  Comment: (NOTE) The Xpert SA Assay (FDA approved for NASAL specimens in patients 43 years of age and older), is one component of a comprehensive surveillance program. It is not intended to diagnose infection nor to guide or monitor treatment. Performed at [facility], [address], [place], [place] [address]   Aerobic/Anaerobic Culture w Gram Stain (surgical/deep wound)     Status: None (Preliminary result)   Collection Time: 08/03/20  9:47 AM   Specimen: Finger, Left; Wound  Result Value Ref Range Status   Specimen Description   Final    FINGER LEFT Performed at [facility], [address] [place], [place] [address]    Special Requests   Final    NONE Performed at [facility], [address], [place], [place] [address]    Gram Stain   Final    FEW WBC PRESENT, PREDOMINANTLY PMN NO ORGANISMS SEEN Performed at [facility], [address], [place], [place] [address]    Culture   Final    FEW METHICILLIN RESISTANT STAPHYLOCOCCUS AUREUS NO ANAEROBES ISOLATED; CULTURE IN PROGRESS FOR 5 DAYS    Report Status PENDING  Incomplete   Organism ID, Bacteria METHICILLIN RESISTANT STAPHYLOCOCCUS AUREUS  Final      Susceptibility   Methicillin resistant staphylococcus aureus - MIC*    CIPROFLOXACIN >=8 RESISTANT Resistant     ERYTHROMYCIN >=8 RESISTANT Resistant     GENTAMICIN <=0.5 SENSITIVE Sensitive     OXACILLIN >=4 RESISTANT Resistant     TETRACYCLINE <=1 SENSITIVE Sensitive     VANCOMYCIN 1  SENSITIVE Sensitive     TRIMETH/SULFA <=10 SENSITIVE Sensitive     CLINDAMYCIN <=0.25 SENSITIVE Sensitive     RIFAMPIN <=0.5 SENSITIVE Sensitive     Inducible Clindamycin NEGATIVE Sensitive     * FEW METHICILLIN RESISTANT STAPHYLOCOCCUS AUREUS         Radiology Studies: No results found. Scheduled Meds: . chlorhexidine  60 mL Topical Once  . Chlorhexidine Gluconate Cloth  6 each Topical Q0600  . DULoxetine  60 mg Oral Daily  . gabapentin  300 mg Oral BID  . insulin aspart  0-15 Units Subcutaneous TID WC  . insulin aspart  0-5 Units Subcutaneous QHS  . insulin glargine  15 Units Subcutaneous BID  . mupirocin ointment  1 application Nasal BID  . oxybutynin  10 mg Oral Daily  . oxyCODONE  15 mg Oral Q12H  . povidone-iodine  2 application Topical Once   Continuous Infusions: . sodium chloride 250 mL (08/01/20 1546)  . vancomycin       LOS: 5 days   Time spent:  Azucena Fallen, DO [facility]  If 7PM-7AM, please contact night-coverage [email or web address]  08/06/2020, 8:02 AM

## 2020-08-06 NOTE — Op Note (Addendum)
PREOPERATIVE DIAGNOSIS: Severe left index and hand infection  POSTOPERATIVE DIAGNOSIS: Same  ATTENDING PHYSICIAN: Gasper Lloyd. Roney Mans, III, MD who was present and scrubbed for the entire case   ASSISTANT SURGEON: None.   ANESTHESIA: General  SURGICAL PROCEDURES: 1.  Repeat irrigation and debridement of left index finger 2.  Revision amputation of left index finger 3.  Irrigation and debridement of left index finger flexor tendon sheath 4.  Tenosynovectomy of left index finger FDS in the finger and palm 5.  Irrigation and debridement of first webspace and abductor musculature  SURGICAL INDICATIONS: Patient is a 55 year old female who has had a severe infection to her left index finger.  Over the past 1 to 2 days she has had increased redness, swelling and pain extending more proximally into her hand.  This included significant erythema and tenderness around the base of the thumb palmarly.  An MRI was obtained which showed myositis surrounding the abductor musculature.  Due to her continued worsening pain, erythema, swelling and symptoms in the left hand, I did recommend proceeding forward with repeat irrigation and debridement with the hopes of clearing her severe left hand infection.  FINDING: See operative note  DESCRIPTION OF PROCEDURE: Patient was identified in the preoperative holding area where the risk benefits and alternatives of the procedure were once again discussed the patient.  These risks include but are not limited to continued infection, bleeding, damage to surrounding structures including blood vessels and nerves, pain, stiffness and need for additional procedures.  Informed consent was obtained the patient's left hand was marked.  Patient was then taken back to the operative suite where time was performed identifying the correct patient operative site.  She was positioned supine on the operative table and induced under general LMA anesthesia.  The left hand was placed on a hand  table and a tourniquet placed in the upper arm.  Left upper extremity was prepped with Betadine and draped in usual sterile fashion.  The limb was exsanguinated by gravity and the tourniquet was inflated.  Previous sutures along the distal aspect of the index finger and the radial mid lateral line were removed.  The ulnarly based skin flap was elevated.  There is some purulent material around the tip of the digit.  This incision was extended along the palmar flexion crease towards the carpal tunnel.  Blunt dissection was carried down through subcutaneous tissues.  The neurovascular bundles to the index and middle finger were identified and protected with retractors.  There was some purulence continuing to extend along the course of the remaining FDS tendon.  Additionally there was some purulence within the adductor musculature which was seen deep to the FDS tendon.  A secondary incision was then made along the palmar aspect of the thumb.  A Bruner type incision was made and connected to the initial incision in the palm.  Blunt dissection was carried down through the subcutaneous tissues.  The digital neurovascular bundles extending up to the thumb were identified and protected.  There was further purulence in the area of the flexor tendon and adductor musculature.    Thorough debridement of the first webspace and adductor musculature removing all infectious soft tissues while taking care to protect the surrounding neurovascular bundles.  Additionally, thorough debridement was once again performed along the flexor tendon sheath including a thorough tenosynovectomy of the FDS tendon throughout the course of the wound.  This was done sharply with scissors as well as bluntly with rondure.  The palmar soft tissues  along the index finger were then also again debrided with a curette and rondure.  The distal portion of the middle phalanx was shortened using a bone biter removing the articular condyles.  The skin at the  tip of the previously amputated digit had necrosis and had some persistent infectious looking material so the skin was shortened to allow for more adequate coverage as well over the shortened middle phalanx.  An additional incision was then made over the dorsal aspect of the index finger MP joint.  Blunt dissection was performed remaining superficial to the extensor mechanism working both proximally and distally.  No further purulent material was appreciated throughout the dorsal aspect of the hand and finger.  At this point the wound was copiously irrigated with 3 L of normal saline via cystoscopy tubing.  No further purulent material was appreciated around the wound proximally into the palm and hand.  At this point Penrose drains were placed throughout the course of the wounds and the incisions were all loosely closed over top of the drain.  The index finger amputation site was closed at the tip to allow for coverage of the middle phalanx bone.  Xeroform, 4 x 4's and a bulky soft dressing was then applied to the hand and index finger.  The tourniquet was released and the patient had return of brisk capillary refill to the remaining digits including the thumb.  She was awoken from her anesthesia and taken to the PACU in stable condition.  She tolerated the procedure well and there were no complications.  RADIOGRAPHIC INTERPRETATION: None  ESTIMATED BLOOD LOSS: 25 mL  TOURNIQUET TIME: Approximately 30 minutes  SPECIMENS: None  POSTOPERATIVE PLAN: Patient will be transferred back up to the hospital floor for continued inpatient care under the hospitalist service.  I will have PT hydrotherapy start on her tomorrow to remove her Penrose drains and continue with some gentle debridement to her hand and finger.  We will continue to monitor her for clinical improvement with IV antibiotics.  My hope is that this will be her final procedure and she will not require further surgical intervention of her  hand.  IMPLANTS: None  Debridement type: Excisional Debridement

## 2020-08-06 NOTE — Transfer of Care (Signed)
Immediate Anesthesia Transfer of Care Note  Patient: [name]  Procedure(s) Performed: IRRIGATION AND DEBRIDEMENT EXTREMITY (Left Hand)  Patient Location: PACU  Anesthesia Type:General  Level of Consciousness: awake, alert  and oriented  Airway & Oxygen Therapy: Patient Spontanous Breathing and Patient connected to face mask oxygen  Post-op Assessment: Report given to RN and Post -op Vital signs reviewed and stable  Post vital signs: Reviewed and stable  Last Vitals:  Vitals Value Taken Time  BP 131/74 08/06/20 1934  Temp    Pulse 83 08/06/20 1939  Resp    SpO2 94 % 08/06/20 1939  Vitals shown include unvalidated device data.  Last Pain:  Vitals:   08/06/20 1638  TempSrc: Oral  PainSc:       Patients Stated Pain Goal: 2 (08/06/20 1543)  Complications: No complications documented.

## 2020-08-06 NOTE — Anesthesia Procedure Notes (Signed)
Procedure Name: LMA Insertion Date/Time: 08/06/2020 5:55 PM Performed by: Yolonda Kida, CRNA Pre-anesthesia Checklist: Emergency Drugs available, Patient identified, Suction available and Patient being monitored Patient Re-evaluated:Patient Re-evaluated prior to induction Oxygen Delivery Method: Circle system utilized Preoxygenation: Pre-oxygenation with 100% oxygen Induction Type: IV induction LMA: LMA inserted LMA Size: 4.0 Number of attempts: 1 Placement Confirmation: positive ETCO2 and breath sounds checked- equal and bilateral Tube secured with: Tape Dental Injury: Teeth and Oropharynx as per pre-operative assessment

## 2020-08-06 NOTE — Progress Notes (Signed)
   Ortho Hand Progress Note  Subjective: No acute events last night. Afebrile. Increased errythem in the palm and dorsal aspect of the IF  Objective: Vital signs in last 24 hours: Temp:  [98.4 F (36.9 C)-100.8 F (38.2 C)] 98.4 F (36.9 C) (04/12 1638) Pulse Rate:  [85-92] 85 (04/12 1638) Resp:  [18] 18 (04/12 1638) BP: (124-132)/(57-63) 124/63 (04/12 1638) SpO2:  [94 %-99 %] 99 % (04/12 1638)  Intake/Output from previous day: 04/11 0701 - 04/12 0700 In: 240 [P.O.:240] Out: -  Intake/Output this shift: Total I/O In: 200 [IV Piggyback:200] Out: -   Recent Labs    08/04/20 0324 08/05/20 0410 08/06/20 0909  HGB 8.7* 9.2* 8.7*   Recent Labs    08/05/20 0410 08/06/20 0909  WBC 6.7 7.4  RBC 3.10* 2.90*  HCT 29.8* 27.3*  PLT 244 242   Recent Labs    08/05/20 0410 08/05/20 2241 08/06/20 0909  NA 132*  --  137  K 4.0  --  3.8  CL 99  --  102  CO2 24  --  25  BUN 13  --  14  CREATININE 0.99  --  1.00  GLUCOSE 331* 436* 249*  CALCIUM 8.5*  --  8.7*   No results for input(s): LABPT, INR in the last 72 hours.  Aaox3 nad Resp nonlabored RRR LUE: partial amputation of the L IF. Increased errythema  In the palm and streaking up the dorsal aspect of the hand. Increased erythema to the dorsal aspect of the IF. Tenderness to palpation at the base of the thumb and first web space. No purulence noted from the index finger tip.   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 DOS 4/7. Repeat I&D on 4/9  - Hospitalists primary. Appreciate management - Cxs MRSA - BID dressing changes. Encourage IF flex/ex - continue Abx - MRI shows intense signal within the first webspace and adductor musculature. Also with fluid around the IF metacarpal head - Plan for repeat I&D with extension into the palm and thumb - r/b/a again discussed with the patient. Marked.  - Remainder per primary    Cain Saupe III 08/06/2020, 5:17 PM  [phone]  [phone]

## 2020-08-06 NOTE — Plan of Care (Signed)
  Problem: Clinical Measurements: Goal: Will remain free from infection Outcome: Not Progressing   Problem: Coping: Goal: Level of anxiety will decrease Outcome: Progressing   Problem: Pain Managment: Goal: General experience of comfort will improve Outcome: Progressing   Problem: Safety: Goal: Ability to remain free from injury will improve Outcome: Progressing   Problem: Skin Integrity: Goal: Risk for impaired skin integrity will decrease Outcome: Progressing   Problem: Education: Goal: Knowledge of risk factors and measures for prevention of condition will improve Outcome: Progressing

## 2020-08-06 NOTE — Plan of Care (Signed)
  Problem: Activity: Goal: Risk for activity intolerance will decrease Outcome: Progressing   Problem: Elimination: Goal: Will not experience complications related to bowel motility Outcome: Progressing   Problem: Pain Managment: Goal: General experience of comfort will improve Outcome: Progressing   Problem: Safety: Goal: Ability to remain free from injury will improve Outcome: Progressing   

## 2020-08-06 NOTE — Anesthesia Preprocedure Evaluation (Addendum)
Anesthesia Evaluation  Patient identified by MRN, date of birth, ID band Patient awake    Reviewed: Allergy & Precautions, NPO status , Patient's Chart, lab work & pertinent test results  History of Anesthesia Complications Negative for: history of anesthetic complications  Airway Mallampati: III  TM Distance: >3 FB Neck ROM: Full    Dental  (+) Dental Advisory Given, Teeth Intact   Pulmonary sleep apnea ,  Covid-19 Nucleic Acid Test Results Lab Results      Component                Value               Date                      SARSCOV2NAA              POSITIVE (A)        08/01/2020              breath sounds clear to auscultation       Cardiovascular + DOE   Rhythm:Regular    Left ventricle: The cavity size was normal. Wall thickness was  increased in a pattern of moderate LVH. Systolic function was  vigorous. The estimated ejection fraction was in the range of 65%  to 70%. Wall motion was normal; there were no regional wall  motion abnormalities. Features are consistent with a pseudonormal  left ventricular filling pattern, with concomitant abnormal  relaxation and increased filling pressure (grade 2 diastolic  dysfunction).  - Aortic valve: Valve area (VTI): 2.35 cm^2. Valve area (Vmax):  2.13 cm^2. Valve area (Vmean): 2.21 cm^2.  - Pulmonary arteries: Systolic pressure was mildly increased. PA  peak pressure: 35 mm Hg (S).    Neuro/Psych  Headaches, PSYCHIATRIC DISORDERS Depression  Neuromuscular disease    GI/Hepatic   Endo/Other  diabetes, Poorly ControlledMorbid obesity  Renal/GU ARFRenal disease          Musculoskeletal  (+) Arthritis ,   Abdominal   Peds  Hematology  (+) Blood dyscrasia, anemia ,   Anesthesia Other Findings   Reproductive/Obstetrics                            Anesthesia Physical  Anesthesia Plan  ASA: IV  Anesthesia Plan: General    Post-op Pain Management:    Induction: Intravenous  PONV Risk Score and Plan: 3 and Ondansetron and Propofol infusion  Airway Management Planned: LMA  Additional Equipment: None  Intra-op Plan:   Post-operative Plan: Extubation in OR  Informed Consent: I have reviewed the patients History and Physical, chart, labs and discussed the procedure including the risks, benefits and alternatives for the proposed anesthesia with the patient or authorized representative who has indicated his/her understanding and acceptance.     Dental advisory given  Plan Discussed with: CRNA, Surgeon and Anesthesiologist  Anesthesia Plan Comments:        Anesthesia Quick Evaluation

## 2020-08-07 ENCOUNTER — Encounter (HOSPITAL_COMMUNITY): Payer: Self-pay | Admitting: Orthopaedic Surgery

## 2020-08-07 LAB — URINALYSIS, ROUTINE W REFLEX MICROSCOPIC
Bacteria, UA: NONE SEEN
Bilirubin Urine: NEGATIVE
Glucose, UA: >=500 mg/dL — AB
Ketones, ur: 5 mg/dL — AB
Leukocytes,Ua: NEGATIVE
Nitrite: NEGATIVE
Protein, ur: NEGATIVE mg/dL
Specific Gravity, Urine: 1.023 (ref 1.005–1.030)
pH: 6 (ref 5.0–8.0)

## 2020-08-07 LAB — GLUCOSE, CAPILLARY
Glucose-Capillary: 269 mg/dL — ABNORMAL HIGH (ref 70–99)
Glucose-Capillary: 269 mg/dL — ABNORMAL HIGH (ref 70–99)
Glucose-Capillary: 310 mg/dL — ABNORMAL HIGH (ref 70–99)
Glucose-Capillary: 507 mg/dL (ref 70–99)
Glucose-Capillary: 572 mg/dL (ref 70–99)
Glucose-Capillary: 582 mg/dL (ref 70–99)
Glucose-Capillary: 589 mg/dL (ref 70–99)

## 2020-08-07 LAB — BLOOD GAS, ARTERIAL
Acid-base deficit: 1.1 mmol/L (ref 0.0–2.0)
Bicarbonate: 23.7 mmol/L (ref 20.0–28.0)
FIO2: 32
O2 Content: 3 L/min
O2 Saturation: 95.6 %
Patient temperature: 37
pCO2 arterial: 42.7 mmHg (ref 32.0–48.0)
pH, Arterial: 7.363 (ref 7.350–7.450)
pO2, Arterial: 78.7 mmHg — ABNORMAL LOW (ref 83.0–108.0)

## 2020-08-07 LAB — CBC
HCT: 29.2 % — ABNORMAL LOW (ref 36.0–46.0)
Hemoglobin: 8.8 g/dL — ABNORMAL LOW (ref 12.0–15.0)
MCH: 29.5 pg (ref 26.0–34.0)
MCHC: 30.1 g/dL (ref 30.0–36.0)
MCV: 98 fL (ref 80.0–100.0)
Platelets: 239 10*3/uL (ref 150–400)
RBC: 2.98 MIL/uL — ABNORMAL LOW (ref 3.87–5.11)
RDW: 14.8 % (ref 11.5–15.5)
WBC: 8.1 10*3/uL (ref 4.0–10.5)
nRBC: 0 % (ref 0.0–0.2)

## 2020-08-07 LAB — BASIC METABOLIC PANEL
Anion gap: 11 (ref 5–15)
BUN: 22 mg/dL — ABNORMAL HIGH (ref 6–20)
CO2: 22 mmol/L (ref 22–32)
Calcium: 8.6 mg/dL — ABNORMAL LOW (ref 8.9–10.3)
Chloride: 102 mmol/L (ref 98–111)
Creatinine, Ser: 1.18 mg/dL — ABNORMAL HIGH (ref 0.44–1.00)
GFR, Estimated: 55 mL/min — ABNORMAL LOW (ref >60)
Glucose, Bld: 661 mg/dL (ref 70–99)
Potassium: 4.9 mmol/L (ref 3.5–5.1)
Sodium: 135 mmol/L (ref 135–145)

## 2020-08-07 LAB — AEROBIC/ANAEROBIC CULTURE W GRAM STAIN (SURGICAL/DEEP WOUND): Gram Stain: NONE SEEN

## 2020-08-07 LAB — BETA-HYDROXYBUTYRIC ACID: Beta-Hydroxybutyric Acid: 0.66 mmol/L — ABNORMAL HIGH (ref 0.05–0.27)

## 2020-08-07 MED ORDER — DEXTROSE 50 % IV SOLN: 0.0000 mL | INTRAVENOUS | Status: DC | PRN

## 2020-08-07 MED ORDER — INSULIN ASPART 100 UNIT/ML ~~LOC~~ SOLN: 15.0000 [IU] | Freq: Once | SUBCUTANEOUS | Status: DC

## 2020-08-07 MED ORDER — LACTATED RINGERS IV SOLN: INTRAVENOUS | Status: DC

## 2020-08-07 MED ORDER — INSULIN REGULAR(HUMAN) IN NACL 100-0.9 UT/100ML-% IV SOLN
INTRAVENOUS | Status: DC
  Filled 2020-08-07: qty 100

## 2020-08-07 MED ORDER — INSULIN ASPART 100 UNIT/ML ~~LOC~~ SOLN
15.0000 [IU] | Freq: Once | SUBCUTANEOUS | Status: AC
  Administered 2020-08-07: 15 [IU] via SUBCUTANEOUS

## 2020-08-07 MED ORDER — INSULIN ASPART 100 UNIT/ML ~~LOC~~ SOLN
20.0000 [IU] | Freq: Once | SUBCUTANEOUS | Status: AC
  Administered 2020-08-07: 20 [IU] via SUBCUTANEOUS

## 2020-08-07 MED ORDER — INSULIN ASPART 100 UNIT/ML ~~LOC~~ SOLN
0.0000 [IU] | SUBCUTANEOUS | Status: DC
  Administered 2020-08-07: 15 [IU] via SUBCUTANEOUS
  Administered 2020-08-07: 11 [IU] via SUBCUTANEOUS
  Administered 2020-08-08 (×3): 4 [IU] via SUBCUTANEOUS
  Administered 2020-08-08: 3 [IU] via SUBCUTANEOUS
  Administered 2020-08-08: 4 [IU] via SUBCUTANEOUS
  Administered 2020-08-08: 11 [IU] via SUBCUTANEOUS
  Administered 2020-08-09 (×2): 3 [IU] via SUBCUTANEOUS

## 2020-08-07 MED ORDER — DEXTROSE IN LACTATED RINGERS 5 % IV SOLN: INTRAVENOUS | Status: DC

## 2020-08-07 MED ORDER — INSULIN GLARGINE 100 UNIT/ML ~~LOC~~ SOLN
20.0000 [IU] | Freq: Two times a day (BID) | SUBCUTANEOUS | Status: DC
  Administered 2020-08-07 (×2): 20 [IU] via SUBCUTANEOUS
  Filled 2020-08-07 (×3): qty 0.2

## 2020-08-07 MED ORDER — INSULIN ASPART 100 UNIT/ML ~~LOC~~ SOLN: 8.0000 [IU] | Freq: Three times a day (TID) | SUBCUTANEOUS | Status: DC

## 2020-08-07 NOTE — Progress Notes (Signed)
   Ortho Hand Progress Note  Subjective: No acute events with L hand last night. Does note decreased pain in the hand today. Blood glucose levels significantly elevated overnight and today.  Objective: Vital signs in last 24 hours: Temp:  [97.8 F (36.6 C)-98.4 F (36.9 C)] 98.2 F (36.8 C) (04/13 0345) Pulse Rate:  [74-86] 81 (04/13 0345) Resp:  [16-19] 18 (04/13 0345) BP: (124-151)/(60-82) 124/60 (04/13 0345) SpO2:  [93 %-100 %] 93 % (04/13 0345)  Intake/Output from previous day: 04/12 0701 - 04/13 0700 In: 300 [I.V.:100; IV Piggyback:200] Out: -  Intake/Output this shift: No intake/output data recorded.  Recent Labs    08/05/20 0410 08/06/20 0909 08/07/20 0400  HGB 9.2* 8.7* 8.8*   Recent Labs    08/06/20 0909 08/07/20 0400  WBC 7.4 8.1  RBC 2.90* 2.98*  HCT 27.3* 29.2*  PLT 242 239   Recent Labs    08/06/20 0909 08/07/20 0400  NA 137 135  K 3.8 4.9  CL 102 102  CO2 25 22  BUN 14 22*  CREATININE 1.00 1.18*  GLUCOSE 249* 661*  CALCIUM 8.7* 8.6*   No results for input(s): LABPT, INR in the last 72 hours.  Aaox3 nad Resp nonlabored RRR LUE: partial amputation of the L IF. Decreased erythema and swelling the the hand and IF. Decreased streaking along the dorsal of the hand and forearm. Significantly less tenderness to palpation. Fingers remain wwp with bcr. 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 DOS 4/7. Repeat I&D on 4/9 and 4/12  - Hospitalists primary. Appreciate management - Cxs MRSA - continue Abx - PT hydrotherapy to start today. OK to remove penrose drains and place packing if needed.  - Discussed blood glucose levels with the patient today and the need for tighter control to help clear her infection.  - Remainder per primary    Cain Saupe III 08/07/2020, 12:09 PM  (336) [phone]

## 2020-08-07 NOTE — Progress Notes (Signed)
Inpatient Diabetes Program Recommendations  AACE/ADA: New Consensus Statement on Inpatient Glycemic Control (2015)  Target Ranges:  Prepandial:   less than 140 mg/dL      Peak postprandial:   less than 180 mg/dL (1-2 hours)      Critically ill patients:  140 - 180 mg/dL   Lab Results  Component Value Date   GLUCAP 269 (H) 08/07/2020   HGBA1C 7.7 (H) 08/01/2020    Review of Glycemic Control  Diabetes history: DM2 Outpatient Diabetes medications: glipizide 10 mg QAM, sitagliptin-metformin 50-1000 mg QD, Trulicity 0.75 weekly Current orders for Inpatient glycemic control: Lantus 20 units BID, Novolog 0-20 units Q4H + 8 units TID with meals  HgbA1C - 7.7% Covid 19+. On no steroids.  Inpatient Diabetes Program Recommendations:     Agree with orders.  Continue to titrate insulin until CBGs between 140-180 mg/dL.  Follow trends.   Thank you. Ailene Ards, RD, LDN, CDE Inpatient Diabetes Coordinator [phone]

## 2020-08-07 NOTE — Progress Notes (Signed)
Patient had three critical glucose levels of 661, 572, and 589. Patient transfer to step down and insulin drip ordered per hospitalist.   Pt refused the transfer and insulin drip. Charge nurse, hospitalist, and [facility] notified.  Will continue to monitor CBG.
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
## 2020-08-07 NOTE — Plan of Care (Signed)
Pt hyperglycemic. Insulin drip and transfer to step down ordered but pt persistently refused insulin drip and transfer. So placed patient on q4h SSI and accu cheks.

## 2020-08-08 LAB — AEROBIC/ANAEROBIC CULTURE W GRAM STAIN (SURGICAL/DEEP WOUND)

## 2020-08-08 LAB — CBC
HCT: 29.4 % — ABNORMAL LOW (ref 36.0–46.0)
Hemoglobin: 9.1 g/dL — ABNORMAL LOW (ref 12.0–15.0)
MCH: 29.4 pg (ref 26.0–34.0)
MCHC: 31 g/dL (ref 30.0–36.0)
MCV: 94.8 fL (ref 80.0–100.0)
Platelets: 299 10*3/uL (ref 150–400)
RBC: 3.1 MIL/uL — ABNORMAL LOW (ref 3.87–5.11)
RDW: 14.6 % (ref 11.5–15.5)
WBC: 9 10*3/uL (ref 4.0–10.5)
nRBC: 0.2 % (ref 0.0–0.2)

## 2020-08-08 LAB — MAGNESIUM: Magnesium: 1.9 mg/dL (ref 1.7–2.4)

## 2020-08-08 LAB — GLUCOSE, CAPILLARY
Glucose-Capillary: 191 mg/dL — ABNORMAL HIGH (ref 70–99)
Glucose-Capillary: 193 mg/dL — ABNORMAL HIGH (ref 70–99)
Glucose-Capillary: 178 mg/dL — ABNORMAL HIGH (ref 70–99)
Glucose-Capillary: 153 mg/dL — ABNORMAL HIGH (ref 70–99)
Glucose-Capillary: 149 mg/dL — ABNORMAL HIGH (ref 70–99)

## 2020-08-08 MED ORDER — INSULIN ASPART 100 UNIT/ML ~~LOC~~ SOLN
10.0000 [IU] | Freq: Three times a day (TID) | SUBCUTANEOUS | Status: DC
  Administered 2020-08-08 (×3): 10 [IU] via SUBCUTANEOUS

## 2020-08-08 MED ORDER — INSULIN GLARGINE 100 UNIT/ML ~~LOC~~ SOLN
24.0000 [IU] | Freq: Two times a day (BID) | SUBCUTANEOUS | Status: DC
  Administered 2020-08-08 – 2020-08-09 (×3): 24 [IU] via SUBCUTANEOUS
  Filled 2020-08-08 (×3): qty 0.24

## 2020-08-08 NOTE — Progress Notes (Signed)
Pharmacy Antibiotic Note  [name] is a 55 y.o. female admitted on 08/01/2020. 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 Pt currently on antibiotics for wound/OM of left index finger. Wound culture growing MRSA.  Surgery: -4/7: I&D w/partial amputation -4/9: repeat I&D -4/12: Repeat I&D  Today, 08/08/20  WBC WNL  Afebrile  SCr 1.18, CrCl ~70 mL/min. Slight increase  Today is day #8 IV vancomycin.  Plan:  Continue vancomycin 1000 mg IV q12h  Goal vancomycin AUC 400-550  Check SCr with AM labs tomorrow  Height: 5\' 4"  (162.6 cm) Weight: 123.4 kg (272 lb) IBW/kg (Calculated) : 54.7  Temp (24hrs), Avg:98.3 F (36.8 C), Min:98 F (36.7 C), Max:98.6 F (37 C)  Recent Labs  Lab 08/01/20 1503 08/01/20 1658 08/01/20 2230 08/02/20 0046 08/03/20 0403 08/04/20 0324 08/05/20 0410 08/05/20 1540 08/05/20 1824 08/06/20 0909 08/07/20 0400 08/08/20 0356  WBC 8.8  --   --  9.2 7.9 7.8 6.7  --   --  7.4 8.1 9.0  CREATININE 1.00  --   --  0.97 0.96 0.96 0.99  --   --  1.00 1.18*  --   LATICACIDVEN 5.0* 3.3* 2.2* 2.5*  --   --   --   --   --   --   --   --   VANCOTROUGH  --   --   --   --   --   --   --  7*  --   --   --   --   VANCOPEAK  --   --   --   --   --   --   --   --  38  --   --   --     Estimated Creatinine Clearance: 70.7 mL/min (A) (by C-G formula based on SCr of 1.18 mg/dL (H)).    Allergies  Allergen Reactions  . Rocephin [Ceftriaxone] Shortness Of Breath and Swelling    Tolerated course of Rocephin 6/19 and multiple courses of Ancef/Keflex as well as Cefepime  . Daptomycin Other (See Comments)    Chills and lower back pain; Patient states had issues due to this medication and Vancomycin ran at the same time; Patient states related to infected PICC Line   . Vancomycin Other (See Comments)    Drives creatinine level(s) "through the roof" and they didn't return to normal for approx 14  days Reaction happened in march, has since had vancomycin without problem. Patient states was related to infected PICC Line; after PICC changed no problems.    Antimicrobials this admission: 4/7 vanc >> 4/7 cefepime >> 4/8  Dose adjustments this admission: 4/11: Vp 38 mcg/mL, VT 7 mcg/mL. AUC 425 on 1500 mg q24h. Dose changed to 1000 mg IV q12h  Microbiology results: 4/7 BCx: ngF 4/7 wound : Abundant MRSA 4/9 surgical deep wound: Few MRSA 4/12 repeat BCx: ngtd  Thank you for allowing pharmacy to be a part of this patient's care.  6/12, PharmD 08/08/20 9:48 AM
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
## 2020-08-08 NOTE — Progress Notes (Signed)
Physical Therapy Wound Evaluation Patient Details  Name: [name] MRN: [id number] Date of Birth: [birth year]  Today's Date: 08/07/2020 Time: 0459-9774 45 minutes  Subjective  Subjective Assessment Subjective: BKA - pt denies falls and is independent mobilizing with prosthesis on level ground, uses forearm crutches on unlevel ground. 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 This has lead to multiple wounds. Patient and Family Stated Goals: I am trying to save my finger but know I might not be able to. Prior Treatments: Self care at home. Hospital treatments: 4/7-I&D with partial index finger anputation; subsequent I&D on 4/9 and 4/12. PT Hydro ordered to begin 4/13.   Wound Assessment  Wound / Incision (Open or Dehisced) 06/10/17 Other (Comment) Finger (Comment which one) Right right middle finger (Active)     Wound / Incision (Open or Dehisced) 08/07/20 Incision - Open Finger (Comment which one) Left;Anterior;Posterior;Distal PT HYDROTHERAPY ONLY incision at distal finger extending proximally into palm (Active)  Wound Image    08/07/20 1300  Dressing Type Impregnated gauze (bismuth);Compression wrap;Gauze (Comment) 08/07/20 1300  Dressing Changed Changed 08/07/20 1300  Dressing Status Old drainage;Dry;Intact;Clean 08/07/20 1300  Dressing Change Frequency PRN 08/07/20 1300  Site / Wound Assessment Clean;Granulation tissue;Pink;Red 08/07/20 1300  % Wound base Red or Granulating 85% 08/07/20 1300  % Wound base Other/Granulation Tissue (Comment) 15% 08/07/20 1300  Peri-wound Assessment Intact;Edema;Maceration;Pink 08/07/20 1300  Margins Unattached edges (unapproximated) 08/07/20 1300  Closure Sutures 08/07/20 1300  Drainage Amount Minimal 08/07/20 1300  Drainage Description Serosanguineous;Sanguineous 08/07/20 1300  Treatment Cleansed;Debridement (Selective);Hydrotherapy (Pulse lavage);Packing (Impregnated strip);Tape changed 08/07/20 1300      Incision (Closed) 02/22/18 Finger (Comment which one) Right (Active)     Incision (Closed) 08/06/20 Hand Left (Active)  Dressing Type Gauze (Comment);Compression wrap 08/07/20 2057  Dressing Clean;Dry;Intact 08/07/20 2057  Dressing Change Frequency Other (Comment) 08/07/20 0822  Site / Wound Assessment Dressing in place / Unable to assess 08/07/20 2057  Drainage Amount None 08/07/20 2057  Treatment Ice applied 08/06/20 1934   Hydrotherapy Pulsed lavage therapy - wound location: LT hand and index finger Pulsed Lavage with Suction (psi): 10 psi Pulsed Lavage with Suction - Normal Saline Used: 500 mL Pulsed Lavage Tip: Tip with splash shield Selective Debridement Selective Debridement - Location: Lt hand/index finger Selective Debridement - Tools Used: Forceps Selective Debridement - Tissue Removed: slough    Wound Assessment and Plan  Wound Therapy Clinical Statement - Patient is being evaluated by Acute PT for Hydrotherapy. She is POD1 s/p Lt index finger I&D with partial amputation of Lt index finger on 4/7 and additional I&D on 4/9 and 4/12. Wound has 3 penrose drains that this PT removed based on MD's orders. Woudn was irrigated with pulsed lavage and minimal slough removed at wound margins with forceps. 80% of wound bed is healthy red granular tissue. Xeroform dressing applied followed by guaze wrap and ace bandage for compression. Patient will benefit from skilled wound care 6x/week during acute stay. Will reduce frequency as needed and adjust dressings to manage drainage and maintain moist wound bed for healing. Wound Therapy - Assess/Plan/Recommendations Wound Therapy - Functional Problem List: neuropathy.impaired senstaion, edema, Factors Delaying/Impairing Wound Healing: Altered sensation,Diabetes Mellitus,Multiple medical problems Hydrotherapy Plan: Debridement,Dressing change,Patient/family education,Pulsatile lavage with suction Wound Therapy - Frequency: 6X / week Wound  Therapy - Current Recommendations: PT Wound Therapy - Follow Up Recommendations: f/u pulsed lavage with suction,f/u selective debridement (Outpatient wound center with transition to dressing changes performed by pt/family.)  Wound Therapy Goals- Improve the function of patient's integumentary system by progressing the wound(s) through the phases of wound healing (inflammation - proliferation - remodeling) by: Wound Therapy Goals - Improve the function of patient's integumentary system by progressing the wound(s) through the phases of wound healing by: Increase Granulation Tissue to: 100% Increase Granulation Tissue - Progress: Goal set today Decrease Length/Width/Depth by (cm): by 25% or more in length, depth, width Decrease Length/Width/Depth - Progress: Goal set today Goals/treatment plan/discharge plan were made with and agreed upon by patient/family: Yes Time For Goal Achievement: 2 weeks  Goals will be updated until maximal potential achieved or discharge criteria met.  Discharge criteria: when goals achieved, discharge from hospital, MD decision/surgical intervention, no progress towards goals, refusal/missing three consecutive treatments without notification or medical reason.  GP       08/07/20 1300  PT Visit Information  Last PT Received On 08/07/20  Assistance Needed +1  History of Present Illness Patient is a 55 y.o. female admitted on 08/01/2020 for c/o worsening wound on Lt index finger secondary to burn. Hand radiographs were obtained at [facility] which showed osteolysis around the distal phalanx of the index finger as well as wounds to the tip of the digit concerning for osteomyelitis. Pt also found to be Covid positive. 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 Surgery consulted and pt s/p I&D with partial amputation of Lt index finger on 4/7 and additional I&D on 4/9 and 4/12. Currently on antibiotics for  wound/OM of left index finger. Wound culture growing MRSA.  Precautions  Precautions Fall  Precaution Comments BKA - pt denies falls and is independent mobilizing with prosthesis on level ground, uses forearm crutches on unlevel ground.  Restrictions  Weight Bearing Restrictions No  Home Living  Family/patient expects to be discharged to: Private residence  Living Arrangements Spouse/significant other;Parent  Available Help at Discharge Family  Type of [place]  Additional Comments pt has assist from her husband at home if needed but typically is fully independent. Just recieved a new prosthesis and has had her BKA for 4+ years.  Prior Function  Level of Independence Independent  Communication  Communication No difficulties  Pain Assessment  Pain Assessment Faces  Faces Pain Scale 2  Cognition  Arousal/Alertness Awake/alert  Behavior During Therapy WFL for tasks assessed/performed  Overall Cognitive Status Within Functional Limits for tasks assessed    Charges     08/07/20 1422  PT Wound Care Charges  $Wound Debridement up to 20 cm < or equal to 20 cm  $PT Hydrotherapy Dressing 1 dressing  $PT PLS Gun and Tip 1 Supply  $PT Hydrotherapy Visit 1 Visit  PT Evaluation  $PT Eval Moderate Complexity 1 Mod       [name], DPT Acute Rehabilitation Services Office [phone] Pager [phone]    [name] 08/08/2020, 11:22 AM

## 2020-08-08 NOTE — Progress Notes (Signed)
   Ortho Hand Progress Note  Subjective: No acute events with L hand last night. Continued decreased pain in the left hand today.  Objective: Vital signs in last 24 hours: Temp:  [98 F (36.7 C)-98.6 F (37 C)] 98 F (36.7 C) (04/14 0320) Pulse Rate:  [77-81] 77 (04/14 0320) Resp:  [18-20] 18 (04/14 0320) BP: (94-119)/(57-62) 113/57 (04/14 0320) SpO2:  [94 %-96 %] 96 % (04/14 0320)  Intake/Output from previous day: No intake/output data recorded. Intake/Output this shift: No intake/output data recorded.  Recent Labs    08/06/20 0909 08/07/20 0400 08/08/20 0356  HGB 8.7* 8.8* 9.1*   Recent Labs    08/07/20 0400 08/08/20 0356  WBC 8.1 9.0  RBC 2.98* 3.10*  HCT 29.2* 29.4*  PLT 239 299   Recent Labs    08/06/20 0909 08/07/20 0400  NA 137 135  K 3.8 4.9  CL 102 102  CO2 25 22  BUN 14 22*  CREATININE 1.00 1.18*  GLUCOSE 249* 661*  CALCIUM 8.7* 8.6*   No results for input(s): LABPT, INR in the last 72 hours.  Aaox3 nad Resp nonlabored RRR LUE: partial amputation of the L IF. Decreased erythema and swelling the the hand and IF. No streaking to the hand or forearm today. Significantly less tenderness to palpation. Fingers remain wwp with bcr. 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 DOS 4/7. Repeat I&D on 4/9 and 4/12  - Hospitalists primary. Appreciate management - Cxs MRSA - continue Abx - Continue PT hydrotherapy. Educate on continued dressing changes  - If patient continues to progress, she continues to stabilize medically and wounds to the hand improve, OK for d/c tomorrow after pt hydrotherapy. Follow up with next week in clinic Tuesday or Wednesday for a wound check. I will be unable to round on patient tomorrow. Please call my cell at [phone] with any questions or concerns.  - Remainder per primary    Cain Saupe III 08/08/2020, 10:22  AM  [phone]

## 2020-08-09 LAB — CBC
HCT: 31.1 % — ABNORMAL LOW (ref 36.0–46.0)
Hemoglobin: 9.5 g/dL — ABNORMAL LOW (ref 12.0–15.0)
MCH: 29.6 pg (ref 26.0–34.0)
MCHC: 30.5 g/dL (ref 30.0–36.0)
MCV: 96.9 fL (ref 80.0–100.0)
Platelets: 308 10*3/uL (ref 150–400)
RBC: 3.21 MIL/uL — ABNORMAL LOW (ref 3.87–5.11)
RDW: 14.6 % (ref 11.5–15.5)
WBC: 6.1 10*3/uL (ref 4.0–10.5)
nRBC: 0 % (ref 0.0–0.2)

## 2020-08-09 LAB — BASIC METABOLIC PANEL
Anion gap: 8 (ref 5–15)
BUN: 25 mg/dL — ABNORMAL HIGH (ref 6–20)
CO2: 24 mmol/L (ref 22–32)
Calcium: 8.7 mg/dL — ABNORMAL LOW (ref 8.9–10.3)
Chloride: 108 mmol/L (ref 98–111)
Creatinine, Ser: 0.84 mg/dL (ref 0.44–1.00)
GFR, Estimated: >60 mL/min (ref >60)
Glucose, Bld: 119 mg/dL — ABNORMAL HIGH (ref 70–99)
Potassium: 4.3 mmol/L (ref 3.5–5.1)
Sodium: 140 mmol/L (ref 135–145)

## 2020-08-09 LAB — GLUCOSE, CAPILLARY
Glucose-Capillary: 105 mg/dL — ABNORMAL HIGH (ref 70–99)
Glucose-Capillary: 125 mg/dL — ABNORMAL HIGH (ref 70–99)
Glucose-Capillary: 137 mg/dL — ABNORMAL HIGH (ref 70–99)
Glucose-Capillary: 137 mg/dL — ABNORMAL HIGH (ref 70–99)

## 2020-08-09 MED ORDER — DOXYCYCLINE HYCLATE 100 MG PO TABS: 100.0000 mg | ORAL_TABLET | Freq: Two times a day (BID) | ORAL | 0 refills | Status: AC

## 2020-08-09 NOTE — Progress Notes (Signed)
Physical Therapy Wound Treatment Patient Details  Name: [name] MRN: [id number] Date of Birth: [birth year]  Today's Date: 08/09/2020 Time: 6387-5643 Time Calculation (min): 47 min  Subjective  Subjective Assessment Subjective: I am going home today! Patient and Family Stated Goals: I am trying to save my finger but know I might not be able to. Prior Treatments: Self care at home. Hospital treatments: 4/7-I&D with partial index finger anputation; subsequent I&D on 4/9 and 4/12. PT Hydro ordered to begin 4/13.  Pain Score:    Wound Assessment  Wound / Incision (Open or Dehisced) 06/10/17 Other (Comment) Finger (Comment which one) Right right middle finger (Active)     Wound / Incision (Open or Dehisced) 08/07/20 Incision - Open Finger (Comment which one) Left;Anterior;Posterior;Distal PT HYDROTHERAPY ONLY incision at distal finger extending proximally into palm (Active)  Wound Image    08/09/20 1200  Dressing Type Silver hydrofiber;Impregnated gauze (bismuth);Gauze (Comment);Compression wrap 08/09/20 1200  Dressing Changed Changed 08/09/20 1200  Dressing Status Old drainage 08/09/20 1200  Dressing Change Frequency PRN 08/09/20 1200  Site / Wound Assessment Clean;Pale;Red;Pink 08/09/20 1200  % Wound base Red or Granulating 85% 08/09/20 1200  % Wound base Other/Granulation Tissue (Comment) 15% 08/09/20 1200  Peri-wound Assessment Maceration;Pink;Intact 08/09/20 1200  Wound Length (cm) 10.8 cm 08/08/20 1700  Wound Width (cm) 1.1 cm 08/08/20 1700  Wound Depth (cm) 0.2 cm 08/08/20 1700  Wound Volume (cm^3) 2.38 cm^3 08/08/20 1700  Wound Surface Area (cm^2) 11.88 cm^2 08/08/20 1700  Margins Unattached edges (unapproximated) 08/09/20 1200  Closure Sutures 08/09/20 1200  Drainage Amount Moderate 08/09/20 1200  Drainage Description Serosanguineous 08/09/20 1200  Treatment Debridement (Selective);Cleansed;Hydrotherapy (Pulse lavage);Tape changed 08/09/20 1200     Incision (Closed)  02/22/18 Finger (Comment which one) Right (Active)     Incision (Closed) 08/06/20 Hand Left (Active)  Dressing Type Gauze (Comment);Adhesive bandage 08/08/20 0800  Dressing Changed 08/08/20 0800  Dressing Change Frequency Other (Comment) 08/07/20 0822  Site / Wound Assessment Dressing in place / Unable to assess 08/08/20 0800  Drainage Amount None 08/07/20 2057  Treatment Ice applied 08/06/20 1934   Hydrotherapy Pulsed lavage therapy - wound location: LT hand and index finger Pulsed Lavage with Suction (psi): 10 psi Pulsed Lavage with Suction - Normal Saline Used: 500 mL Pulsed Lavage Tip: Tip with splash shield Selective Debridement Selective Debridement - Location: Lt hand/index finger Selective Debridement - Tools Used: Forceps Selective Debridement - Tissue Removed: slough    Wound Assessment and Plan  Wound Therapy - Assess/Plan/Recommendations Wound Therapy - Clinical Statement: Pt reports occasional stinging sensation in palm of hand that runs up into index finger. Dressing with minimal to moderate serousangionous drainage from inex finger. decreased maceration noted in palm of hand at periwound after changing dressing to silver hydrofiber. Macerated superfical layer easily removed with sharps debridement revealing healthy pink tissue underneath. Irrigated wound with pulsed lavage and loose slough removed with forceps. Redressed wound with silver hydrofiber at palm and xeroform along index finger and wrapped with guaze and ace bandage. Patient may not require pulsed lavage after today but likely will require additiona skilled debridement. Recommend she follow up with surgeon on Tuesday and with [facility]. Educated pt to gently wash/cleanse wound with warm soapy water and pat dry with 4x4 guaze. Educated on wound dressing for index finger and palm of Lt hand and to rewrap with guaze and Ace. Instructed pt to perform 1x/day. Acute PT will follow to promote wound healing while she  remains  in acute setting. Wound Therapy - Functional Problem List: neuropathy;impaired senstaion, edema, Factors Delaying/Impairing Wound Healing: Altered sensation,Diabetes Mellitus,Multiple medical problems Hydrotherapy Plan: Debridement,Dressing change,Patient/family education,Pulsatile lavage with suction Wound Therapy - Frequency: 6X / week Wound Therapy - Current Recommendations: PT Wound Therapy - Follow Up Recommendations: f/u pulsed lavage with suction,f/u selective debridement (Outpatient wound center with transition to dressing changes performed by pt/family.)  Wound Therapy Goals- Improve the function of patient's integumentary system by progressing the wound(s) through the phases of wound healing (inflammation - proliferation - remodeling) by: Wound Therapy Goals - Improve the function of patient's integumentary system by progressing the wound(s) through the phases of wound healing by: Increase Granulation Tissue to: 100% Increase Granulation Tissue - Progress: Progressing toward goal Decrease Length/Width/Depth by (cm): by 25% or more in length, depth, width Decrease Length/Width/Depth - Progress: Progressing toward goal Goals/treatment plan/discharge plan were made with and agreed upon by patient/family: Yes Time For Goal Achievement: 2 weeks  Goals will be updated until maximal potential achieved or discharge criteria met.  Discharge criteria: when goals achieved, discharge from hospital, MD decision/surgical intervention, no progress towards goals, refusal/missing three consecutive treatments without notification or medical reason.  GP     Charges PT Wound Care Charges $Wound Debridement up to 20 cm: < or equal to 20 cm $PT Hydrotherapy Dressing: 1 dressing $PT PLS Gun and Tip: 1 Supply $PT Hydrotherapy Visit: 1 Visit      Verner Mould, DPT Acute Rehabilitation Services Office [phone] Pager [phone]

## 2020-08-09 NOTE — Progress Notes (Signed)
Physical Therapy Wound Treatment Patient Details  Name: [name] MRN: [id number] Date of Birth: Jan 17, 1966  Today's Date: 08/09/2020 Time: 7412-8786 41 minutes  Subjective  Subjective Assessment Subjective: BKA - pt denies falls and is independent mobilizing with prosthesis on level ground, uses forearm crutches on unlevel ground. 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 This has lead to multiple wounds. Patient and Family Stated Goals: I am trying to save my finger but know I might not be able to. Prior Treatments: Self care at home. Hospital treatments: 4/7-I&D with partial index finger anputation; subsequent I&D on 4/9 and 4/12. PT Hydro ordered to begin 4/13.  Pain Score:    Wound Assessment  Wound / Incision (Open or Dehisced) 06/10/17 Other (Comment) Finger (Comment which one) Right right middle finger (Active)     Wound / Incision (Open or Dehisced) 08/07/20 Incision - Open Finger (Comment which one) Left;Anterior;Posterior;Distal PT HYDROTHERAPY ONLY incision at distal finger extending proximally into palm (Active)  Wound Image    08/08/20 1700  Dressing Type Adhesive bandage;Gauze (Comment) 08/08/20 1700  Dressing Changed Changed 08/08/20 1700  Dressing Status Old drainage 08/08/20 1700  Dressing Change Frequency PRN 08/08/20 1700  Site / Wound Assessment Clean;Dry 08/09/20 0826  % Wound base Red or Granulating 90% 08/08/20 1700  % Wound base Other/Granulation Tissue (Comment) 10% 08/08/20 1700  Peri-wound Assessment Intact;Maceration;Pink;Edema 08/08/20 1700  Wound Length (cm) 10.8 cm 08/08/20 1700  Wound Width (cm) 1.1 cm 08/08/20 1700  Wound Depth (cm) 0.2 cm 08/08/20 1700  Wound Volume (cm^3) 2.38 cm^3 08/08/20 1700  Wound Surface Area (cm^2) 11.88 cm^2 08/08/20 1700  Margins Unattached edges (unapproximated) 08/08/20 1700  Closure Sutures 08/08/20 1700  Drainage Amount Moderate 08/08/20 1700  Drainage Description Serosanguineous  08/08/20 1700  Treatment Cleansed;Debridement (Selective);Hydrotherapy (Pulse lavage);Tape changed; 08/08/20 1700     Incision (Closed) 02/22/18 Finger (Comment which one) Right (Active)     Incision (Closed) 08/06/20 Hand Left (Active)  Dressing Type Gauze (Comment);Adhesive bandage 08/08/20 0800  Dressing Changed 08/08/20 0800  Dressing Change Frequency Other (Comment) 08/07/20 0822  Site / Wound Assessment Dressing in place / Unable to assess 08/08/20 0800  Drainage Amount None 08/07/20 2057  Treatment Ice applied 08/06/20 1934   Hydrotherapy Pulsed lavage therapy - wound location: LT hand and index finger Pulsed Lavage with Suction (psi): 10 psi Pulsed Lavage with Suction - Normal Saline Used: 500 mL Pulsed Lavage Tip: Tip with splash shield Selective Debridement Selective Debridement - Location: Lt hand/index finger Selective Debridement - Tools Used: Forceps Selective Debridement - Tissue Removed: slough    Wound Assessment and Plan  Wound Therapy - Assess/Plan/Recommendations Wound Therapy - Clinical Statement: Pt denies pain in Lt hand and index finger. Edema and errythema decreased today from evaluation in palm of hand. Dressings with serousangionous drainage and periwound in palm of hand slightly macerated, no maceration noted along margins at index finger. Wound bed cleansed with pulsed lavage and slough removed using forceps. Xeroform continued along beefy red tissue in wound bed of index finger and sivler hydrofiber used for dressing to wound bed in hand. Wraped finger and hand with guaze dressing and Ace wrap. Acute PT will continue hydrotherapy to promote wound healing. Wound Therapy - Functional Problem List: neuropathy;impaired senstaion, edema, Factors Delaying/Impairing Wound Healing: Altered sensation,Diabetes Mellitus,Multiple medical problems Hydrotherapy Plan: Debridement,Dressing change,Patient/family education,Pulsatile lavage with suction Wound Therapy -  Frequency: 6X / week Wound Therapy - Current Recommendations: PT Wound Therapy -  Follow Up Recommendations: f/u pulsed lavage with suction,f/u selective debridement (Outpatient wound center with transition to dressing changes performed by pt/family.)  Wound Therapy Goals- Improve the function of patient's integumentary system by progressing the wound(s) through the phases of wound healing (inflammation - proliferation - remodeling) by: Wound Therapy Goals - Improve the function of patient's integumentary system by progressing the wound(s) through the phases of wound healing by: Increase Granulation Tissue to: 100% Increase Granulation Tissue - Progress: Goal set today Decrease Length/Width/Depth by (cm): by 25% or more in length, depth, width Decrease Length/Width/Depth - Progress: Goal set today Goals/treatment plan/discharge plan were made with and agreed upon by patient/family: Yes Time For Goal Achievement: 2 weeks  Goals will be updated until maximal potential achieved or discharge criteria met.  Discharge criteria: when goals achieved, discharge from hospital, MD decision/surgical intervention, no progress towards goals, refusal/missing three consecutive treatments without notification or medical reason.  GP     Charges   08/08/20 1400  PT Wound Care Charges  $Wound Debridement up to 20 cm < or equal to 20 cm  $PT Hydrotherapy Dressing 1 dressing  $PT PLS Gun and Tip 1 Supply  $PT Hydrotherapy Visit 1 Visit         Verner Mould, DPT Acute Rehabilitation Services Office [phone] Pager [phone]

## 2020-08-09 NOTE — Discharge Summary (Signed)
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 Description BLOOD RIGHT ANTECUBITAL  Final   Special Requests   Final    BOTTLES DRAWN AEROBIC AND ANAEROBIC Blood Culture adequate volume   Culture   Final    NO GROWTH 5 DAYS Performed at [facility], [address],  [place], [place] [address]    Report Status 08/06/2020 FINAL  Final  Blood culture (routine x 2)     Status: None   Collection Time: 08/01/20  2:54 PM   Specimen: BLOOD  Result Value Ref Range Status   Specimen Description BLOOD LEFT ANTECUBITAL  Final   Special Requests   Final    BOTTLES DRAWN AEROBIC AND ANAEROBIC Blood Culture adequate volume   Culture   Final    NO GROWTH 5 DAYS Performed at [facility], [address] [place], [place] [address]    Report Status 08/06/2020 FINAL  Final  Resp Panel by RT-PCR (Flu A&B, Covid) Nasopharyngeal Swab     Status: Abnormal   Collection Time: 08/01/20  4:06 PM   Specimen: Nasopharyngeal Swab; Nasopharyngeal(NP) swabs in vial transport medium  Result Value Ref Range Status   SARS Coronavirus 2 by RT PCR POSITIVE (A) NEGATIVE Final    Comment: RESULT CALLED TO, READ BACK BY AND VERIFIED WITH: Nelia Shi RN [address] (NOTE) SARS-CoV-2 target nucleic acids are DETECTED.  The SARS-CoV-2 RNA is generally detectable in upper respiratory specimens during the acute phase of infection. Positive results are indicative of the presence of the identified virus, but do not rule out bacterial infection or co-infection with other pathogens not detected by the test. Clinical correlation with patient history and other diagnostic information is necessary to determine patient infection status. The expected result is Negative.  Fact Sheet for Patients: BloggerCourse.com  Fact Sheet for Healthcare Providers: SeriousBroker.it  This test is not yet approved or cleared by the [place] FDA and  has been authorized for detection and/or diagnosis of SARS-CoV-2 by FDA under an Emergency Use Authorization (EUA).  This EUA will remain in effect (meaning this test can be used) for  the duration of  the COVID-19 declaration under Section 564(b)(1) of the Act, 21 U.S.C. section 360bbb-3(b)(1), unless the  authorization is terminated  or revoked sooner.     Influenza A by PCR NEGATIVE NEGATIVE Final   Influenza B by PCR NEGATIVE NEGATIVE Final    Comment: (NOTE) The Xpert Xpress SARS-CoV-2/FLU/RSV plus assay is intended as an aid in the diagnosis of influenza from Nasopharyngeal swab specimens and should not be used as a sole basis for treatment. Nasal washings and aspirates are unacceptable for Xpert Xpress SARS-CoV-2/FLU/RSV testing.  Fact Sheet for Patients: BloggerCourse.com  Fact Sheet for Healthcare Providers: SeriousBroker.it  This test is not yet approved or cleared by the [place] FDA and has been authorized for detection and/or diagnosis of SARS-CoV-2 by FDA under an Emergency Use Authorization (EUA). This EUA will remain in effect (meaning this test can be used) for the duration of the COVID-19 declaration under Section 564(b)(1) of the Act, 21 U.S.C. section 360bbb-3(b)(1), unless the authorization is terminated or revoked.  Performed at [facility]   Aerobic/Anaerobic Culture w Gram Stain (surgical/deep wound)     Status: None   Collection Time: 08/01/20  7:13 PM   Specimen: Joint, Finger  Result Value Ref Range Status   Specimen Description   Final    FINGER LEFT INDEX Performed at [facility], [address], [place], [place] [address]    Special Requests   Final    NONE Performed at [facility], [address] [place], [place] [address]    Gram Stain NO WBC SEEN MODERATE GRAM POSITIVE COCCI   Final   Culture   Final    ABUNDANT METHICILLIN RESISTANT STAPHYLOCOCCUS AUREUS NO ANAEROBES ISOLATED Performed at [facility], [address] [place], [place] [address]    Report Status 08/07/2020 FINAL  Final   Organism ID, Bacteria METHICILLIN RESISTANT STAPHYLOCOCCUS AUREUS  Final      Susceptibility   Methicillin resistant staphylococcus  aureus - MIC*    CIPROFLOXACIN >=8 RESISTANT Resistant     ERYTHROMYCIN >=8 RESISTANT Resistant     GENTAMICIN <=0.5 SENSITIVE Sensitive     OXACILLIN >=4 RESISTANT Resistant     TETRACYCLINE <=1 SENSITIVE Sensitive     VANCOMYCIN 1 SENSITIVE Sensitive     TRIMETH/SULFA <=10 SENSITIVE Sensitive     CLINDAMYCIN <=0.25 SENSITIVE Sensitive     RIFAMPIN <=0.5 SENSITIVE Sensitive     Inducible Clindamycin NEGATIVE Sensitive     * ABUNDANT METHICILLIN RESISTANT STAPHYLOCOCCUS AUREUS  Surgical pcr screen     Status: Abnormal   Collection Time: 08/03/20  8:06 AM   Specimen: Nasal Mucosa; Nasal Swab  Result Value Ref Range Status   MRSA, PCR POSITIVE (A) NEGATIVE Final    Comment: RESULT CALLED TO, READ BACK BY AND VERIFIED WITH: BULLINS,H. RN AT 1049 08/03/20 MULLINS,T    Staphylococcus aureus POSITIVE (A) NEGATIVE Final    Comment: (NOTE) The Xpert SA Assay (FDA approved for NASAL specimens in patients 38 years of age and older), is one component of a comprehensive surveillance program. It is not intended to diagnose infection nor to guide or monitor treatment. Performed at [facility], [address] [place], [place] [address]   Aerobic/Anaerobic Culture w Gram Stain (surgical/deep wound)     Status: None   Collection Time: 08/03/20  9:47 AM   Specimen: Finger, Left; Wound  Result Value Ref Range Status   Specimen Description   Final    FINGER LEFT Performed at [facility], [address] [place], [place] [address]    Special Requests  Final    NONE Performed at [facility], [address] [place], [place] [address]    Gram Stain   Final    FEW WBC PRESENT, PREDOMINANTLY PMN NO ORGANISMS SEEN    Culture   Final    FEW METHICILLIN RESISTANT STAPHYLOCOCCUS AUREUS NO ANAEROBES ISOLATED Performed at [facility], [address] [place], [place] [address]    Report Status 08/08/2020 FINAL  Final   Organism  ID, Bacteria METHICILLIN RESISTANT STAPHYLOCOCCUS AUREUS  Final      Susceptibility   Methicillin resistant staphylococcus aureus - MIC*    CIPROFLOXACIN >=8 RESISTANT Resistant     ERYTHROMYCIN >=8 RESISTANT Resistant     GENTAMICIN <=0.5 SENSITIVE Sensitive     OXACILLIN >=4 RESISTANT Resistant     TETRACYCLINE <=1 SENSITIVE Sensitive     VANCOMYCIN 1 SENSITIVE Sensitive     TRIMETH/SULFA <=10 SENSITIVE Sensitive     CLINDAMYCIN <=0.25 SENSITIVE Sensitive     RIFAMPIN <=0.5 SENSITIVE Sensitive     Inducible Clindamycin NEGATIVE Sensitive     * FEW METHICILLIN RESISTANT STAPHYLOCOCCUS AUREUS  Culture, blood (routine x 2)     Status: None (Preliminary result)   Collection Time: 08/06/20  9:09 AM   Specimen: BLOOD  Result Value Ref Range Status   Specimen Description   Final    BLOOD RIGHT ANTECUBITAL Performed at [facility], [address] [place], [place] [address]    Special Requests   Final    BOTTLES DRAWN AEROBIC AND ANAEROBIC Blood Culture adequate volume Performed at [facility], [address] [place], [place] [address]    Culture   Final    NO GROWTH 3 DAYS Performed at [facility], [address] [place], [place] [address]    Report Status PENDING  Incomplete  Culture, blood (routine x 2)     Status: None (Preliminary result)   Collection Time: 08/06/20  9:19 AM   Specimen: BLOOD  Result Value Ref Range Status   Specimen Description   Final    BLOOD LEFT ANTECUBITAL Performed at [facility], [address] [place], [place] [address]    Special Requests   Final    BOTTLES DRAWN AEROBIC AND ANAEROBIC Blood Culture adequate volume Performed at [facility], [address] [place], [place] [address]    Culture   Final    NO GROWTH 3 DAYS Performed at [facility], [address] [place], [place] [address]    Report Status PENDING  Incomplete     Time coordinating  discharge: Over 30 minutes  SIGNED:   Alvira PhilipsEric J [place], DO  [facility] 08/09/2020, 12:02 PM

## 2020-08-11 LAB — CULTURE, BLOOD (ROUTINE X 2)
Culture: NO GROWTH
Culture: NO GROWTH
Special Requests: ADEQUATE
Special Requests: ADEQUATE

## 2020-09-17 ENCOUNTER — Other Ambulatory Visit: Payer: Self-pay | Admitting: Infectious Diseases

## 2020-09-17 DIAGNOSIS — M869 Osteomyelitis, unspecified: Secondary | ICD-10-CM

## 2020-12-03 ENCOUNTER — Other Ambulatory Visit: Payer: Self-pay | Admitting: Infectious Diseases

## 2020-12-03 DIAGNOSIS — M869 Osteomyelitis, unspecified: Secondary | ICD-10-CM

## 2020-12-03 NOTE — Telephone Encounter (Signed)
Received refill request for cephalexin. Patient has not been seen since 07/2019 - does she need follow up? Thanks!

## 2020-12-05 ENCOUNTER — Telehealth: Payer: Self-pay | Admitting: *Deleted

## 2020-12-05 NOTE — Telephone Encounter (Signed)
Received refill request for patient's keflex. She has not been seen since 07/2019. RN left voicemail asking patient to call for an appointment with Dr Ninetta Lights. Andree Coss, RN

## 2020-12-05 NOTE — Telephone Encounter (Signed)
Left message asking patient to call for an appointment.  Andree Coss, RN

## 2020-12-10 NOTE — Telephone Encounter (Signed)
Attempted to  call patient to schedule appt. Not able to reach her at this time.

## 2020-12-12 NOTE — Telephone Encounter (Signed)
Attempted to call patient on both number listed to schedule follow up. Home phone rings and ends with dial tone. Left voicemail on patient's mobil number requesting follow up. Will send mychart message requesting appointment.  Juanita Laster, RMA

## 2021-09-28 ENCOUNTER — Encounter: Payer: Self-pay | Admitting: Infectious Diseases

## 2022-03-05 ENCOUNTER — Other Ambulatory Visit: Payer: Self-pay

## 2022-03-05 ENCOUNTER — Encounter (HOSPITAL_BASED_OUTPATIENT_CLINIC_OR_DEPARTMENT_OTHER): Payer: Self-pay | Admitting: Emergency Medicine

## 2022-03-05 ENCOUNTER — Emergency Department (HOSPITAL_BASED_OUTPATIENT_CLINIC_OR_DEPARTMENT_OTHER)
Admission: EM | Admit: 2022-03-05 | Discharge: 2022-03-05 | Disposition: A | Discharge status: Home / Self Care | Payer: Commercial Managed Care - HMO | Attending: Emergency Medicine | Admitting: Emergency Medicine

## 2022-03-05 DIAGNOSIS — Z7984 Long term (current) use of oral hypoglycemic drugs: Secondary | ICD-10-CM | POA: Diagnosis not present

## 2022-03-05 DIAGNOSIS — W268XXA Contact with other sharp object(s), not elsewhere classified, initial encounter: Secondary | ICD-10-CM | POA: Diagnosis not present

## 2022-03-05 DIAGNOSIS — S61317A Laceration without foreign body of left little finger with damage to nail, initial encounter: Secondary | ICD-10-CM | POA: Insufficient documentation

## 2022-03-05 DIAGNOSIS — S61012A Laceration without foreign body of left thumb without damage to nail, initial encounter: Secondary | ICD-10-CM | POA: Insufficient documentation

## 2022-03-05 DIAGNOSIS — E119 Type 2 diabetes mellitus without complications: Secondary | ICD-10-CM | POA: Insufficient documentation

## 2022-03-05 DIAGNOSIS — S61217A Laceration without foreign body of left little finger without damage to nail, initial encounter: Secondary | ICD-10-CM

## 2022-03-05 DIAGNOSIS — S6992XA Unspecified injury of left wrist, hand and finger(s), initial encounter: Secondary | ICD-10-CM | POA: Diagnosis present

## 2022-03-05 MED ORDER — LIDOCAINE HCL (PF) 1 % IJ SOLN
INTRAMUSCULAR | Status: AC
  Filled 2022-03-05: qty 5

## 2022-03-05 NOTE — ED Provider Notes (Signed)
[facility] EMERGENCY DEPT Provider Note   CSN: [id number] Arrival date & time: 03/05/22  1404     History DM Chief Complaint  Patient presents with   Laceration    [name] is a 56 y.o. female.  56 year old female with a past medical history of diabetes presents to the ED status post left hand laceration.  Patient reports she was opening a can when suddenly she cut her left pinky along with left thumb.  Significant bleeding noted, she tried a pressure thing without any improvement in her bleeding.  Patient is a diabetic, she was concern for delayed in wound healing.  Last tetanus immunization was less than 5 years ago she had a previous left leg amputation.  She denies any other injury.  The history is provided by the patient.  Laceration Associated symptoms: no fever        Home Medications Prior to Admission medications   Medication Sig Start Date End Date Taking? Authorizing Provider  diclofenac (VOLTAREN) 75 MG EC tablet Take 75 mg by mouth 2 (two) times daily.  02/28/14   [provider]  DULoxetine (CYMBALTA) 60 MG capsule Take 60 mg by mouth daily.    [provider]  gabapentin (NEURONTIN) 300 MG capsule Take 300 mg by mouth 2 (two) times daily. 04/23/19   [provider]  glipiZIDE (GLUCOTROL XL) 10 MG 24 hr tablet Take 10 mg by mouth daily with breakfast.  05/23/17   [provider]  glucosamine-chondroitin 500-400 MG tablet Take 2 tablets by mouth daily.     [provider]  HYDROcodone-acetaminophen (NORCO) 10-325 MG tablet Take 1 tablet by mouth every 6 (six) hours as needed for moderate pain.  07/02/16   [provider]  Multiple Vitamin (MULTIVITAMIN WITH MINERALS) TABS Take 1 tablet by mouth daily.    [provider]  mupirocin nasal ointment (BACTROBAN) 2 % Place 1 application into the nose 2 (two) times daily. Use one-half of tube in each nostril twice daily for five (5) days.  After application, press sides of nose together and gently massage. Patient taking differently: Place 1 application into the nose 2 (two) times daily as needed (infection). 03/18/18   Ginnie Smart, MD  ondansetron (ZOFRAN-ODT) 8 MG disintegrating tablet Take 8 mg by mouth every 8 (eight) hours as needed for nausea/vomiting. DISSOLVE IN THE MOUTH 04/30/16   [provider]  oxybutynin (DITROPAN-XL) 10 MG 24 hr tablet Take 10 mg by mouth daily.    [provider]  SitaGLIPtin-MetFORMIN HCl 50-1000 MG TB24 Take 1 tablet by mouth at bedtime. 11/21/15   [provider]  TRULICITY 0.75 MG/0.5ML SOPN Inject 0.75 mg into the skin once a week. 07/15/20   [provider]      Allergies    Rocephin [ceftriaxone], Daptomycin, and Vancomycin    Review of Systems   Review of Systems  Constitutional:  Negative for fever.  Respiratory:  Negative for shortness of breath.   Cardiovascular:  Negative for chest pain.  Gastrointestinal:  Negative for abdominal pain.  Skin:  Positive for wound.  All other systems reviewed and are negative.   Physical Exam Updated Vital Signs BP (!) 141/92 (BP Location: Right Arm)   Pulse 88   Temp 98.3 F (36.8 C)   Resp 16   Ht 5\' 4"  (1.626 m)   Wt 119.3 kg   SpO2 100%   BMI 45.14 kg/m  Physical Exam Vitals and nursing note reviewed.  Constitutional:      Appearance: Normal appearance.  HENT:     Head: Normocephalic and atraumatic.  Eyes:     Pupils: Pupils are equal, round, and reactive to light.  Cardiovascular:     Rate and Rhythm: Normal rate.  Pulmonary:     Effort: Pulmonary effort is normal.  Musculoskeletal:     Left hand: Laceration present. No tenderness or bony tenderness. Normal range of motion. Normal strength. Normal sensation. There is no disruption of two-point discrimination. Normal capillary refill. Normal pulse.     Cervical back: Normal range of motion and neck supple.     Comments: 2 lacerations  noted across the left little finger a second laceration to the thumb. Pulses present capillary refill is intact.   Skin:    General: Skin is warm and dry.  Neurological:     Mental Status: She is alert and oriented to person, place, and time.     ED Results / Procedures / Treatments   Labs (all labs ordered are listed, but only abnormal results are displayed) Labs Reviewed - No data to display  EKG None  Radiology No results found.  Procedures .Marland KitchenLaceration Repair  Date/Time: 03/05/2022 4:25 PM  Performed by: Janeece Fitting, PA-C Authorized by: Janeece Fitting, PA-C   Consent:    Consent obtained:  Verbal   Consent given by:  Patient   Risks discussed:  Infection and pain Universal protocol:    Patient identity confirmed:  Verbally with patient Anesthesia:    Anesthesia method:  Local infiltration   Local anesthetic:  Lidocaine 1% w/o epi Laceration details:    Location:  Finger   Finger location:  L thumb   Length (cm):  2   Depth (mm):  1 Exploration:    Limited defect created (wound extended): yes     Hemostasis achieved with:  Direct pressure Treatment:    Area cleansed with:  Saline   Amount of cleaning:  Extensive Skin repair:    Repair method:  Sutures   Suture size:  4-0   Suture material:  Prolene   Suture technique:  Simple interrupted   Number of sutures:  4 Approximation:    Approximation:  Close Repair type:    Repair type:  Simple .Marland KitchenLaceration Repair  Date/Time: 03/05/2022 4:26 PM  Performed by: Janeece Fitting, PA-C Authorized by: Janeece Fitting, PA-C   Consent:    Consent obtained:  Verbal   Consent given by:  Patient   Risks discussed:  Infection, pain and poor cosmetic result Universal protocol:    Patient identity confirmed:  Verbally with patient Laceration details:    Location:  Finger   Finger location:  L small finger   Length (cm):  2.5 Treatment:    Area cleansed with:  Saline   Irrigation solution:  Sterile saline Skin repair:     Repair method:  Sutures   Suture size:  4-0   Suture material:  Prolene   Suture technique:  Simple interrupted   Number of sutures:  4 Repair type:    Repair type:  Simple Post-procedure details:    Dressing:  Open (no dressing)   Procedure completion:  Tolerated well, no immediate complications     Medications Ordered in ED Medications  lidocaine (PF) (XYLOCAINE) 1 % injection (has no administration in time range)  lidocaine (PF) (XYLOCAINE) 1 % injection (has no administration in time range)    ED Course/ Medical Decision Making/ A&P  Medical Decision Making   Patient presents to the ED with a chief complaint of left hand laceration after trying to open a can of chicken.  Patient reports bleeding was unable to be controlled at home, she was concerned as she is a known diabetic with an amputation of her left leg.  Patient arrived to the ED in stable condition, pulses are present, capillary refill is intact, she has good extension and flexion of her left little finger and left thumb.  There is a 2 cm laceration noted to the left little finger and the left thumb.    Wound was irrigated extensively by me, I was able to repair patient's wound with 4 stitches to the left small finger and 4 stitches to the left thumb.  Bleeding was controlled after this, she was placed on a dressing by nursing staff.  We discussed removing these within 7 to 10 days.  She is agreeable to plan and treatment.  Return precautions discussed at length.  Patient hemodynamically stable for discharge.   Portions of this note were generated with Lobbyist. Dictation errors may occur despite best attempts at proofreading.   Final Clinical Impression(s) / ED Diagnoses Final diagnoses:  Laceration of left thumb without foreign body without damage to nail, initial encounter  Laceration of left little finger without foreign body, nail damage status unspecified, initial  encounter    Rx / DC Orders ED Discharge Orders     None         Janeece Fitting, PA-C 03/05/22 1627    Sherwood Gambler, MD 03/06/22 1606

## 2022-03-05 NOTE — ED Notes (Signed)
L thumb and pinky wrapped.

## 2022-03-05 NOTE — ED Triage Notes (Signed)
Pt arrives to ED with c/o laceration to left thumb and pinky from a can lid today.

## 2022-03-05 NOTE — Discharge Instructions (Addendum)
I have placed 8 stitches to your left hand, you will need to have these removed within 7 to 10 days.  Please keep your wound clean and dry.  If you experience any redness, drainage, fevers you will need to have the wound reevaluated.  He also benefit from some Neosporin or bacitracin to the area.

## 2022-03-05 NOTE — ED Notes (Signed)
Patient verbalizes understanding of discharge instructions. Opportunity for questioning and answers were provided. Patient discharged from ED.  °

## 2022-03-08 ENCOUNTER — Encounter (HOSPITAL_BASED_OUTPATIENT_CLINIC_OR_DEPARTMENT_OTHER): Payer: Self-pay

## 2022-03-08 ENCOUNTER — Other Ambulatory Visit: Payer: Self-pay

## 2022-03-08 ENCOUNTER — Emergency Department (HOSPITAL_BASED_OUTPATIENT_CLINIC_OR_DEPARTMENT_OTHER): Payer: Commercial Managed Care - HMO | Admitting: Radiology

## 2022-03-08 ENCOUNTER — Emergency Department (HOSPITAL_BASED_OUTPATIENT_CLINIC_OR_DEPARTMENT_OTHER)
Admission: EM | Admit: 2022-03-08 | Discharge: 2022-03-08 | Disposition: A | Discharge status: Home / Self Care | Payer: Commercial Managed Care - HMO | Attending: Emergency Medicine | Admitting: Emergency Medicine

## 2022-03-08 DIAGNOSIS — L089 Local infection of the skin and subcutaneous tissue, unspecified: Secondary | ICD-10-CM | POA: Diagnosis not present

## 2022-03-08 DIAGNOSIS — Z4801 Encounter for change or removal of surgical wound dressing: Secondary | ICD-10-CM | POA: Diagnosis present

## 2022-03-08 LAB — COMPREHENSIVE METABOLIC PANEL
ALT: 14 U/L (ref 0–44)
AST: 15 U/L (ref 15–41)
Albumin: 3.8 g/dL (ref 3.5–5.0)
Alkaline Phosphatase: 80 U/L (ref 38–126)
Anion gap: 10 (ref 5–15)
BUN: 18 mg/dL (ref 6–20)
CO2: 26 mmol/L (ref 22–32)
Calcium: 9.6 mg/dL (ref 8.9–10.3)
Chloride: 100 mmol/L (ref 98–111)
Creatinine, Ser: 1 mg/dL (ref 0.44–1.00)
GFR, Estimated: >60 mL/min (ref >60)
Glucose, Bld: 227 mg/dL — ABNORMAL HIGH (ref 70–99)
Potassium: 4.3 mmol/L (ref 3.5–5.1)
Sodium: 136 mmol/L (ref 135–145)
Total Bilirubin: 1 mg/dL (ref 0.3–1.2)
Total Protein: 7.3 g/dL (ref 6.5–8.1)

## 2022-03-08 LAB — CBC WITH DIFFERENTIAL/PLATELET
Abs Immature Granulocytes: 0.03 10*3/uL (ref 0.00–0.07)
Basophils Absolute: 0 10*3/uL (ref 0.0–0.1)
Basophils Relative: 0 %
Eosinophils Absolute: 0.1 10*3/uL (ref 0.0–0.5)
Eosinophils Relative: 1 %
HCT: 37.1 % (ref 36.0–46.0)
Hemoglobin: 12.1 g/dL (ref 12.0–15.0)
Immature Granulocytes: 0 %
Lymphocytes Relative: 16 %
Lymphs Abs: 1.4 10*3/uL (ref 0.7–4.0)
MCH: 28.7 pg (ref 26.0–34.0)
MCHC: 32.6 g/dL (ref 30.0–36.0)
MCV: 87.9 fL (ref 80.0–100.0)
Monocytes Absolute: 0.9 10*3/uL (ref 0.1–1.0)
Monocytes Relative: 10 %
Neutro Abs: 6.3 10*3/uL (ref 1.7–7.7)
Neutrophils Relative %: 73 %
Platelets: 189 10*3/uL (ref 150–400)
RBC: 4.22 MIL/uL (ref 3.87–5.11)
RDW: 15.3 % (ref 11.5–15.5)
WBC: 8.8 10*3/uL (ref 4.0–10.5)
nRBC: 0 % (ref 0.0–0.2)

## 2022-03-08 LAB — CBG MONITORING, ED: Glucose-Capillary: 195 mg/dL — ABNORMAL HIGH (ref 70–99)

## 2022-03-08 MED ORDER — AMOXICILLIN-POT CLAVULANATE 875-125 MG PO TABS
1.0000 | ORAL_TABLET | Freq: Once | ORAL | Status: AC
  Administered 2022-03-08: 1 via ORAL
  Filled 2022-03-08: qty 1

## 2022-03-08 MED ORDER — AMOXICILLIN-POT CLAVULANATE 875-125 MG PO TABS: 1.0000 | ORAL_TABLET | Freq: Two times a day (BID) | ORAL | 0 refills | Status: AC

## 2022-03-08 NOTE — ED Provider Notes (Signed)
[facility] EMERGENCY DEPT Provider Note   CSN: [id number] Arrival date & time: 03/08/22  0865     History  Chief Complaint  Patient presents with   Wound Check    [name] is a 56 y.o. female presented to ED complaining of pain and swelling in her left little finger.  She was seen in the ED 3 days ago after accidentally cutting her hand on a can, and had 4 sutures placed in her left small finger.  She was not placed on antibiotics at that time, per review of external records in chart notes, the wound did not appear infected.  However she has noticed gradual swelling of her finger since then.  She is still able to flex her finger but has some difficulty.  She noted a small amount of drainage coming out of her wound site around the sutures.  She has been soaking in hot water at home.  She is a diabetic  HPI     Home Medications Prior to Admission medications   Medication Sig Start Date End Date Taking? Authorizing Provider  amoxicillin-clavulanate (AUGMENTIN) 875-125 MG tablet Take 1 tablet by mouth every 12 (twelve) hours for 7 days. 03/08/22 03/15/22 Yes Aser Nylund, Kermit Balo, MD  diclofenac (VOLTAREN) 75 MG EC tablet Take 75 mg by mouth 2 (two) times daily.  02/28/14   [provider]  DULoxetine (CYMBALTA) 60 MG capsule Take 60 mg by mouth daily.    [provider]  gabapentin (NEURONTIN) 300 MG capsule Take 300 mg by mouth 2 (two) times daily. 04/23/19   [provider]  glipiZIDE (GLUCOTROL XL) 10 MG 24 hr tablet Take 10 mg by mouth daily with breakfast.  05/23/17   [provider]  glucosamine-chondroitin 500-400 MG tablet Take 2 tablets by mouth daily.     [provider]  HYDROcodone-acetaminophen (NORCO) 10-325 MG tablet Take 1 tablet by mouth every 6 (six) hours as needed for moderate pain.  07/02/16   [provider]  Multiple Vitamin (MULTIVITAMIN WITH MINERALS) TABS Take 1 tablet by mouth daily.     [provider]  mupirocin nasal ointment (BACTROBAN) 2 % Place 1 application into the nose 2 (two) times daily. Use one-half of tube in each nostril twice daily for five (5) days. After application, press sides of nose together and gently massage. Patient taking differently: Place 1 application into the nose 2 (two) times daily as needed (infection). 03/18/18   Ginnie Smart, MD  ondansetron (ZOFRAN-ODT) 8 MG disintegrating tablet Take 8 mg by mouth every 8 (eight) hours as needed for nausea/vomiting. DISSOLVE IN THE MOUTH 04/30/16   [provider]  oxybutynin (DITROPAN-XL) 10 MG 24 hr tablet Take 10 mg by mouth daily.    [provider]  SitaGLIPtin-MetFORMIN HCl 50-1000 MG TB24 Take 1 tablet by mouth at bedtime. 11/21/15   [provider]  TRULICITY 0.75 MG/0.5ML SOPN Inject 0.75 mg into the skin once a week. 07/15/20   [provider]      Allergies    Rocephin [ceftriaxone], Daptomycin, and Vancomycin    Review of Systems   Review of Systems  Physical Exam Updated Vital Signs BP 118/67   Pulse 93   Temp 98.3 F (36.8 C)   Resp 18   Ht 5\' 4"  (1.626 m)   Wt 117.9 kg   SpO2 97%   BMI 44.63 kg/m  Physical Exam Constitutional:      General: She is not in  acute distress. HENT:     Head: Normocephalic and atraumatic.  Eyes:     Conjunctiva/sclera: Conjunctivae normal.     Pupils: Pupils are equal, round, and reactive to light.  Cardiovascular:     Rate and Rhythm: Normal rate and regular rhythm.  Pulmonary:     Effort: Pulmonary effort is normal. No respiratory distress.  Skin:    General: Skin is warm and dry.     Comments: There is erythema and tenderness along the flexor tendon of the left fifth finger.  4 sutures are in place.  There is a small amount of drainage around the suture site.  There is no large fluctuant pocket suggestive of an abscess collection.  Patient is able to flex and extend her finger. There is no streaking  erythema up the palmar hand  Neurological:     General: No focal deficit present.     Mental Status: She is alert. Mental status is at baseline.  Psychiatric:        Mood and Affect: Mood normal.        Behavior: Behavior normal.     ED Results / Procedures / Treatments   Labs (all labs ordered are listed, but only abnormal results are displayed) Labs Reviewed  COMPREHENSIVE METABOLIC PANEL - Abnormal; Notable for the following components:      Result Value   Glucose, Bld 227 (*)    All other components within normal limits  CBG MONITORING, ED - Abnormal; Notable for the following components:   Glucose-Capillary 195 (*)    All other components within normal limits  CBC WITH DIFFERENTIAL/PLATELET    EKG None  Radiology DG Hand Complete Left  Result Date: 03/08/2022 CLINICAL DATA:  Possible infection left little finger. EXAM: LEFT HAND - COMPLETE 3+ VIEW COMPARISON:  08/01/2020 FINDINGS: Prior amputations involving the distal aspects of the 2nd through 4th digits. Soft tissue swelling within the left little finger. No bone destruction to suggest osteomyelitis. No fracture, subluxation or dislocation. Joint spaces maintained. IMPRESSION: No acute bony abnormality. Electronically Signed   By: Charlett Nose M.D.   On: 03/08/2022 12:20    Procedures Procedures    Medications Ordered in ED Medications  amoxicillin-clavulanate (AUGMENTIN) 875-125 MG per tablet 1 tablet (1 tablet Oral Given 03/08/22 1258)    ED Course/ Medical Decision Making/ A&P                           Medical Decision Making Amount and/or Complexity of Data Reviewed Labs: ordered. Radiology: ordered.  Risk Prescription drug management.   Patient is here with concern for finger redness after a laceration 3 days ago.  Clinically the finger does appear infected.  There is erythema and tenderness along the flexor tendon, but she does not have the other stigmata of acute flexor tenosynovitis at this time.   I suspect this is an early developing abscess or infection, and I do think is reasonable to trial her on a course of antibiotics.  She will be started on Augmentin here now and for 7 days.  I was able to remove all the sutures at this time, as the wound is now appropriately approximated, and I wanted to remove the foreign body sites as a potential source of infection.  I did recommend follow-up with a hand surgeon in the office if possible for reevaluation later this week.  If the infection were to worsen she will need to come back to  the ER, and may require a washout at that time.        Final Clinical Impression(s) / ED Diagnoses Final diagnoses:  Finger infection    Rx / DC Orders ED Discharge Orders          Ordered    amoxicillin-clavulanate (AUGMENTIN) 875-125 MG tablet  Every 12 hours        03/08/22 1320              Terald Sleeper, MD 03/08/22 1322

## 2022-03-08 NOTE — ED Provider Notes (Signed)
.  Suture Removal  Date/Time: 03/08/2022 1:24 PM  Performed by: Terald Sleeper, MD Authorized by: Terald Sleeper, MD   Consent:    Consent obtained:  Verbal   Consent given by:  Patient   Risks discussed:  Pain, wound separation and bleeding Universal protocol:    Immediately prior to procedure, a time out was called: yes     Patient identity confirmed:  Arm band Location:    Location:  Upper extremity   Upper extremity location:  Hand   Hand location:  R small finger Procedure details:    Wound appearance:  Tender, warm and red   Number of sutures removed:  4 Post-procedure details:    Post-removal:  No dressing applied   Procedure completion:  Tolerated well, no immediate complications     Terald Sleeper, MD 03/08/22 1325

## 2022-03-08 NOTE — Discharge Instructions (Addendum)
You have an infection in your finger.  I removed the 4 sutures from your finger.  You can continue applying heat as needed to your finger, gently massaging the lower finger to express any pus.  You also need to take antibiotics for 7 days.  This combination should help with the swelling and infection.  However, these types of finger infections can often progress to more significant infections, including infections of your hands and tendons.  Therefore I would recommend for you to call and make an appointment with a hand surgeon in the office to have the wound looked at again.  Please call the number above tomorrow morning to request a follow-up appointment.  In the meantime, if your wound is getting worse, specifically if you begin having sausagelike swelling of the entire finger, inability to flex or extend or bend the finger, redness streaking up your hand, or loss of feeling or blue-black discoloration in your fingertip, you should return to the emergency room.  These may be signs of a worsening infection that needs treatment.

## 2022-03-08 NOTE — ED Triage Notes (Signed)
Pt state she was seen here on the 9th and had to have sutures placed in her left thumb and left pinky. Pt states she now has redness swelling and yellow discharge from her left pinky around the wound. Pt is a diabetic. Was not treated with abx.

## 2022-03-17 ENCOUNTER — Emergency Department (HOSPITAL_COMMUNITY): Payer: Commercial Managed Care - HMO

## 2022-03-17 ENCOUNTER — Emergency Department (HOSPITAL_COMMUNITY)
Admission: EM | Admit: 2022-03-17 | Discharge: 2022-03-18 | Disposition: A | Discharge status: Home / Self Care | Payer: Commercial Managed Care - HMO | Attending: Student | Admitting: Student

## 2022-03-17 ENCOUNTER — Encounter (HOSPITAL_COMMUNITY): Payer: Self-pay | Admitting: Emergency Medicine

## 2022-03-17 ENCOUNTER — Other Ambulatory Visit: Payer: Self-pay

## 2022-03-17 DIAGNOSIS — M25551 Pain in right hip: Secondary | ICD-10-CM | POA: Insufficient documentation

## 2022-03-17 DIAGNOSIS — Z7982 Long term (current) use of aspirin: Secondary | ICD-10-CM | POA: Diagnosis not present

## 2022-03-17 DIAGNOSIS — M7918 Myalgia, other site: Secondary | ICD-10-CM

## 2022-03-17 DIAGNOSIS — M79604 Pain in right leg: Secondary | ICD-10-CM | POA: Insufficient documentation

## 2022-03-17 DIAGNOSIS — E119 Type 2 diabetes mellitus without complications: Secondary | ICD-10-CM | POA: Insufficient documentation

## 2022-03-17 DIAGNOSIS — Z96653 Presence of artificial knee joint, bilateral: Secondary | ICD-10-CM | POA: Diagnosis not present

## 2022-03-17 MED ORDER — MORPHINE SULFATE (PF) 4 MG/ML IV SOLN
6.0000 mg | Freq: Once | INTRAVENOUS | Status: AC
  Administered 2022-03-17: 6 mg via INTRAMUSCULAR
  Filled 2022-03-17: qty 2

## 2022-03-17 MED ORDER — LIDOCAINE 5 % EX PTCH
1.0000 | MEDICATED_PATCH | CUTANEOUS | Status: DC
  Administered 2022-03-17: 1 via TRANSDERMAL
  Filled 2022-03-17: qty 1

## 2022-03-17 MED ORDER — KETOROLAC TROMETHAMINE 15 MG/ML IJ SOLN
15.0000 mg | Freq: Once | INTRAMUSCULAR | Status: AC
  Administered 2022-03-17: 15 mg via INTRAMUSCULAR
  Filled 2022-03-17: qty 1

## 2022-03-17 MED ORDER — HYDROMORPHONE HCL 1 MG/ML IJ SOLN: 1.0000 mg | Freq: Once | INTRAMUSCULAR | Status: DC

## 2022-03-17 NOTE — ED Triage Notes (Signed)
BIBA Per EMS: Pt fell off horse on Sunday. Pt reports right sided hip/back pain. Pt was initially able to bear wt, unable to do so now.  168/76 84HR  20 RR  96 % RA

## 2022-03-17 NOTE — ED Notes (Signed)
Patient transported to MRI 

## 2022-03-17 NOTE — ED Provider Triage Note (Signed)
Emergency Medicine Provider Triage Evaluation Note  [name] , a 56 y.o. female  was evaluated in triage.  Pt complains of low back/sacral pain, right hip pain.  Patient states that she was on a horse this past Saturday and fell on her right hip/low back.  She notes initially able to ambulate with some pain.  She notes increasing pain over the past 2 days.  She reports increasing inability to control her bladder.  When pressed further, describes as being able to feel the urge to pee but not being able to make it to the bathroom.  Denies bowel incontinence.  Reports baseline "severe neuropathy" from her knee down in her right lower extremity with left lower extremity partial amputation.  Denies any extension of numbness/tingling in her right leg.  Reports weakness in her right leg but unaware of if it is secondary to pain or not.  Reports pain in her saddle area with possible tingling sensation.  Denies bowel incontinence.  Denies history of IV drug use.  Denies trauma to head, loss consciousness or blood thinner use.  Review of Systems  Positive: See above Negative:   Physical Exam  BP 138/66   Pulse 77   Temp 98.7 F (37.1 C) (Oral)   Resp 16   SpO2 100%  Gen:   Awake, no distress   Resp:  Normal effort  MSK:   Moves extremities without difficulty  Other:  Patient has lower midline tenderness to the lumbar/sacral area.  No direct tenderness to palpation of right hip.  Patient slouched on her left side avoiding any kind of pressure to her sacral area/right hip.  Medical Decision Making  Medically screening exam initiated at 1:09 PM.  Appropriate orders placed.  [name] was informed that the remainder of the evaluation will be completed by another provider, this initial triage assessment does not replace that evaluation, and the importance of remaining in the ED until their evaluation is complete.     [name], [place] 03/17/22 1311

## 2022-03-17 NOTE — ED Provider Notes (Signed)
[facility] DEPT Provider Note  CSN: [id number] Arrival date & time: 03/17/22 1231  Chief Complaint(s) Fall and Hip Pain  HPI Alvie Nehmer Colmenares is a 56 y.o. female with PMH chronic low back pain, bilateral lower extremity neuropathy, T2DM, right finger osteomyelitis status post amputation who presents emergency department for evaluation of fall and right gluteal pain.  Patient states that 72 hours ago she fell off a horse onto her right hip and back.  She was able to bear weight immediately afterwards but over the last 3 days has had gradually worsening buttock pain interfering with her ability to walk.  She is endorsing right lower extremity weakness and urinary incontinence.  She states that she does take oxybutynin for chronic urinary incontinence and is unsure if she just cannot make it to the bathroom in time or if she is now urinating herself more frequently.  Denies numbness, tingling, headache, fever, chest pain, shortness of breath, abdominal pain or other systemic or neurologic complaints.   Past Medical History Past Medical History:  Diagnosis Date   Arthritis    Diabetes mellitus without complication ([place])    DOE (dyspnea on exertion) 07/12/2017   FUO (fever of unknown origin) 07/12/2017   Gastroenteritis 04/2016   Headache(784.0)    hormonal headache   Low back pain 07/12/2017   Neuropathy    bilateral feet   Obese    Pleuritic chest pain 07/12/2017   Pneumonia    hx 20+ years ago   Sleep apnea    bipap 1 yr   Ulcer of foot ([place])    toe ulcer greater than a year ago   Patient Active Problem List   Diagnosis Date Noted   Uncontrolled type 2 diabetes mellitus with autonomic neuropathy    COVID-19 virus infection 08/02/2020   Sepsis due to cellulitis ([place]) 08/01/2020   Osteomyelitis ([place]) 08/01/2020   AKI (acute kidney injury) ([place]) 05/02/2019   DM type 2, uncontrolled, with neuropathy 02/21/2018   Depression 02/21/2018   Normocytic  anemia 02/21/2018   Osteomyelitis, lower leg ([place]) 10/19/2017   Left ankle pain 07/27/2017   Osteoarthritis 07/27/2017   Bacteremia due to Klebsiella pneumoniae 07/13/2017   Allergic reaction to drug 07/13/2017   Recurrent infections 07/12/2017   Uncontrolled type 2 diabetes mellitus with hyperglycemia ([place]) 07/12/2017   Idiopathic peripheral neuropathy    Osteomyelitis of finger of right hand ([place]) 06/08/2017   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, without long-term current use of insulin 06/08/2016   Controlled type 2 diabetes with neuropathy ([place]) 05/04/2016   Open wound of plantar aspect of foot 01/13/2016   Morbid (severe) obesity due to excess calories ([place]) 01/17/2015   Deformity of ankle and foot, acquired 12/06/2014   Charcot's joint of foot, left 08/03/2014   Hypersomnia 12/20/2013   Obstructive sleep apnea syndrome, severe 12/20/2013   Other fatigue 12/20/2013   Snoring 12/20/2013   Prosthetic joint infection ([place]) 01/31/2013   Postoperative anemia due to acute blood loss 06/25/2011   Osteoarthritis of knee 06/23/2011   Obesity, morbid (more than 100 lbs over ideal weight or BMI > 40) ([place]) 06/23/2011   Home Medication(s) Prior to Admission medications   Medication Sig Start Date End Date Taking? Authorizing Provider  diclofenac (VOLTAREN) 75 MG EC tablet Take 75 mg by mouth 2 (two) times daily.  02/28/14   [provider]  DULoxetine (CYMBALTA) 60 MG capsule Take 60 mg by mouth daily.    [provider]  gabapentin (NEURONTIN) 300 MG capsule Take 300 mg by mouth 2 (two) times daily. 04/23/19   [provider]  glipiZIDE (GLUCOTROL XL) 10 MG 24 hr tablet Take 10 mg by mouth daily with breakfast.  05/23/17   [provider]  glucosamine-chondroitin 500-400 MG tablet Take 2 tablets by mouth daily.     [provider]  HYDROcodone-acetaminophen (NORCO) 10-325 MG tablet Take 1 tablet by mouth every 6 (six)  hours as needed for moderate pain.  07/02/16   [provider]  Multiple Vitamin (MULTIVITAMIN WITH MINERALS) TABS Take 1 tablet by mouth daily.    [provider]  mupirocin nasal ointment (BACTROBAN) 2 % Place 1 application into the nose 2 (two) times daily. Use one-half of tube in each nostril twice daily for five (5) days. After application, press sides of nose together and gently massage. Patient taking differently: Place 1 application into the nose 2 (two) times daily as needed (infection). 03/18/18   Ginnie Smart, MD  ondansetron (ZOFRAN-ODT) 8 MG disintegrating tablet Take 8 mg by mouth every 8 (eight) hours as needed for nausea/vomiting. DISSOLVE IN THE MOUTH 04/30/16   [provider]  oxybutynin (DITROPAN-XL) 10 MG 24 hr tablet Take 10 mg by mouth daily.    [provider]  SitaGLIPtin-MetFORMIN HCl 50-1000 MG TB24 Take 1 tablet by mouth at bedtime. 11/21/15   [provider]  TRULICITY 0.75 MG/0.5ML SOPN Inject 0.75 mg into the skin once a week. 07/15/20   [provider]                                                                                                                                    Past Surgical History Past Surgical History:  Procedure Laterality Date   AMPUTATION Right 06/11/2017   Procedure: RIGHT LONG FINGER AMPUTATION DIGIT;  Surgeon: Mack Hook, MD;  Location: [facility] OR;  Service: Orthopedics;  Laterality: Right;   AMPUTATION Right 02/22/2018   Procedure: AMPUTATION RIGHT INDEX FINGER;  Surgeon: Knute Neu, MD;  Location: [facility] ORS;  Service: Plastics;  Laterality: Right;   ANKLE FUSION Left 08/03/2014   Procedure: Left Tibiocalcaneal Fusion;  Surgeon: Nadara Mustard, MD;  Location: [facility] OR;  Service: Orthopedics;  Laterality: Left;   APPLICATION OF WOUND VAC Left 08/03/2014   Procedure: APPLICATION OF WOUND VAC;  Surgeon: Nadara Mustard, MD;  Location: [facility] OR;  Service: Orthopedics;  Laterality: Left;   I & D  EXTREMITY Left 08/01/2020   Procedure: IRRIGATION AND DEBRIDEMENT EXTREMITY,PARTIAL AMPUTATION LEFT INDEX FINGER;  Surgeon: Ernest Mallick, MD;  Location: [facility] ORS;  Service: Orthopedics;  Laterality: Left;   I & D EXTREMITY Left 08/06/2020   Procedure: IRRIGATION AND DEBRIDEMENT EXTREMITY;  Surgeon: Ernest Mallick, MD;  Location: [facility] ORS;  Service: Orthopedics;  Laterality: Left;   I & D KNEE WITH POLY EXCHANGE Right 01/31/2013   Procedure: IRRIGATION  AND DEBRIDEMENT KNEE WITH POLY EXCHANGE possible Antibiotic Spacer;  Surgeon: Garald Balding, MD;  Location: [facility];  Service: Orthopedics;  Laterality: Right;   INCISION AND DRAINAGE Right 01/31/2013   ANTIBIOTIC SPACER  RIGHT KNEE  DR [facility]    INCISION AND DRAINAGE ABSCESS Left 08/03/2020   Procedure: INCISION AND DEBRIDEMENT LEFT INDEX FINGER;  Surgeon: Verner Mould, MD;  Location: [facility];  Service: Orthopedics;  Laterality: Left;   KNEE ARTHROSCOPY     left knee   ORIF ANKLE FRACTURE Left 03/16/2014   Procedure: Foot Excision Charcot Collapse,  Internal Fixation;  Surgeon: Newt Minion, MD;  Location: [facility];  Service: Orthopedics;  Laterality: Left;   REPLACEMENT TOTAL KNEE     left   TOTAL KNEE ARTHROPLASTY  06/23/2011   Procedure: TOTAL KNEE ARTHROPLASTY;  Surgeon: Garald Balding, MD;  Location: [facility];  Service: Orthopedics;  Laterality: Right;  RIGHT TOTAL KNEE REPLACEMENT    Family History Family History  Problem Relation Age of Onset   Anesthesia problems Father    Stroke Father    Hypotension Neg Hx    Malignant hyperthermia Neg Hx    Pseudochol deficiency Neg Hx     Social History Social History   Tobacco Use   Smoking status: Never   Smokeless tobacco: Never  Vaping Use   Vaping Use: Never used  Substance Use Topics   Alcohol use: Yes    Alcohol/week: 0.0 standard drinks of alcohol    Comment: occ   Drug use: No   Allergies Rocephin [ceftriaxone], Daptomycin, and Vancomycin  Review of  Systems Review of Systems  Genitourinary:  Positive for urgency.  Musculoskeletal:  Positive for arthralgias and myalgias.    Physical Exam Vital Signs  I have reviewed the triage vital signs BP (!) 140/77   Pulse 89   Temp 99.3 F (37.4 C) (Oral)   Resp 18   SpO2 100%   Physical Exam Vitals and nursing note reviewed.  Constitutional:      General: She is not in acute distress.    Appearance: She is well-developed.  HENT:     Head: Normocephalic and atraumatic.  Eyes:     Conjunctiva/sclera: Conjunctivae normal.  Cardiovascular:     Rate and Rhythm: Normal rate and regular rhythm.     Heart sounds: No murmur heard. Pulmonary:     Effort: Pulmonary effort is normal. No respiratory distress.     Breath sounds: Normal breath sounds.  Abdominal:     Palpations: Abdomen is soft.     Tenderness: There is no abdominal tenderness.  Musculoskeletal:        General: Tenderness present. No swelling.     Cervical back: Neck supple.  Skin:    General: Skin is warm and dry.     Capillary Refill: Capillary refill takes less than 2 seconds.  Neurological:     Mental Status: She is alert.     Cranial Nerves: No cranial nerve deficit.     Sensory: No sensory deficit.     Motor: Weakness present.  Psychiatric:        Mood and Affect: Mood normal.     ED Results and Treatments Labs (all labs ordered are listed, but only abnormal results are displayed) Labs Reviewed - No data to display  Radiology CT Lumbar Spine Wo Contrast  Result Date: 03/17/2022 CLINICAL DATA:  Back trauma, back and sacral pain. Hip pain. Fall from horse. EXAM: CT LUMBAR SPINE WITHOUT CONTRAST TECHNIQUE: Multidetector CT imaging of the lumbar spine was performed without intravenous contrast administration. Multiplanar CT image reconstructions were also generated. RADIATION DOSE REDUCTION:  This exam was performed according to the departmental dose-optimization program which includes automated exposure control, adjustment of the mA and/or kV according to patient size and/or use of iterative reconstruction technique. COMPARISON:  07/12/2017 FINDINGS: Segmentation: The lowest lumbar type non-rib-bearing vertebra is labeled as L5. Alignment: 4 mm degenerative anterolisthesis at L4-5. Vertebrae: No acute lumbar spine fracture or acute bony findings. Vacuum disc phenomenon at L4-5 and L5-S1. Paraspinal and other soft tissues: Unremarkable Disc levels: T12-L1: Unremarkable L1-2: Unremarkable L2-3: Mild left foraminal stenosis and potential mild displacement of the left L2 nerve in the lateral extraforaminal space due to left foraminal and lateral extraforaminal disc protrusion. Borderline central narrowing of the thecal sac. L3-4: Mild bilateral foraminal stenosis and mild central narrowing of the thecal sac due to disc bulge and facet arthropathy. L4-5: Mild to moderate bilateral foraminal stenosis and mild central narrowing of the thecal sac due to facet arthropathy and disc bulge along with disc uncovering. L5-S1: Mild to moderate left and borderline right foraminal stenosis due to facet arthropathy and disc osteophyte complex. IMPRESSION: 1. No acute lumbar spine findings. 2. Lumbar spondylosis and degenerative disc disease causing mild to moderate impingement at L4-5 and L5-S1; and mild impingement L2-3 and L3-4, as detailed above. 3. 4 mm degenerative anterolisthesis at L4-5. Electronically Signed   By: Van Clines M.D.   On: 03/17/2022 14:14   DG Hip Unilat With Pelvis 2-3 Views Right  Result Date: 03/17/2022 CLINICAL DATA:  Right hip pain after fall from horse EXAM: DG HIP (WITH OR WITHOUT PELVIS) 3V RIGHT COMPARISON:  None Available. FINDINGS: There is no evidence of hip fracture or dislocation. There is no evidence of arthropathy or other focal bone abnormality. IMPRESSION: No acute  fracture or dislocation. Electronically Signed   By: Darrin Nipper M.D.   On: 03/17/2022 13:56    Pertinent labs & imaging results that were available during my care of the patient were reviewed by me and considered in my medical decision making (see MDM for details).  Medications Ordered in ED Medications - No data to display                                                                                                                                   Procedures Procedures  (including critical care time)  Medical Decision Making / ED Course   This patient presents to the ED for concern of fall, leg pain, this involves an extensive number of treatment options, and is a complaint that carries with it a high risk of complications and morbidity.  The differential diagnosis includes fracture, ligamentous injury,  contusion, muscle tear, hematoma  MDM: Patient seen the emergency room for evaluation of fall and leg pain.  Physical exam with tenderness at a pinpoint area in the right glute, weakness of the right lower extremity likely secondary to pain.  Patient has a persistent sensory deficit in bilateral lower extremities consistent with her neuropathy that extends up to the mid calf.  Initial trauma imaging obtained in triage reassuringly negative for traumatic injury and shows chronic foraminal stenosis.  Given patient's reported history of urinary incontinence and weakness on exam, and MRI L-spine and MRI pelvis will be obtained and results are currently pending at this time.  Please see provider signout for continuation of workup.  Pending imaging studies  Additional history obtained: -Additional history obtained from husband -External records from outside source obtained and reviewed including: Chart review including previous notes, labs, imaging, consultation notes    Imaging Studies ordered: I ordered imaging studies including CT L-spine, x-ray hip I independently visualized and  interpreted imaging. I agree with the radiologist interpretation  MRI pelvis and L-spine pending  Medicines ordered and prescription drug management: No orders of the defined types were placed in this encounter.   -I have reviewed the patients home medicines and have made adjustments as needed  Critical interventions none  Cardiac Monitoring: The patient was maintained on a cardiac monitor.  I personally viewed and interpreted the cardiac monitored which showed an underlying rhythm of: NSR  Social Determinants of Health:  Factors impacting patients care include: none   Reevaluation: After the interventions noted above, I reevaluated the patient and found that they have :improved  Co morbidities that complicate the patient evaluation  Past Medical History:  Diagnosis Date   Arthritis    Diabetes mellitus without complication ([place])    DOE (dyspnea on exertion) 07/12/2017   FUO (fever of unknown origin) 07/12/2017   Gastroenteritis 04/2016   Headache(784.0)    hormonal headache   Low back pain 07/12/2017   Neuropathy    bilateral feet   Obese    Pleuritic chest pain 07/12/2017   Pneumonia    hx 20+ years ago   Sleep apnea    bipap 1 yr   Ulcer of foot ([place])    toe ulcer greater than a year ago      Dispostion: I considered admission for this patient, and disposition pending imaging studies.  Please see provider note for continuation of workup     Final Clinical Impression(s) / ED Diagnoses Final diagnoses:  None     @PCDICTATION @    Teressa Lower, MD 03/17/22 2317

## 2022-03-18 NOTE — ED Provider Notes (Signed)
I assumed care of this patient.  Please see previous provider note for further details of Hx, PE.  Briefly patient is a 56 y.o. female who presented with right buttock and lower back pain after a fall from a horse several days ago currently pending MRI.  MRI negative for any acute fractures or deep tissue injury. Recommended continued supportive management.  The patient appears reasonably screened and/or stabilized for discharge and I doubt any other medical condition or other [facility] requiring further screening, evaluation, or treatment in the ED at this time. I have discussed the findings, Dx and Tx plan with the patient/family who expressed understanding and agree(s) with the plan. Discharge instructions discussed at length. The patient/family was given strict return precautions who verbalized understanding of the instructions. No further questions at time of discharge.  Disposition: Discharge  Condition: Good  ED Discharge Orders     None            Karyn Brull, Amadeo Garnet, MD 03/18/22 [id number]

## 2022-03-27 DIAGNOSIS — B338 Other specified viral diseases: Secondary | ICD-10-CM

## 2022-03-27 HISTORY — DX: Other specified viral diseases: B33.8

## 2022-05-16 DIAGNOSIS — B9689 Other specified bacterial agents as the cause of diseases classified elsewhere: Secondary | ICD-10-CM

## 2022-05-16 HISTORY — DX: Other specified bacterial agents as the cause of diseases classified elsewhere: B96.89

## 2022-07-01 ENCOUNTER — Emergency Department (HOSPITAL_COMMUNITY): Payer: Commercial Managed Care - HMO

## 2022-07-01 ENCOUNTER — Encounter (HOSPITAL_COMMUNITY): Payer: Self-pay | Admitting: *Deleted

## 2022-07-01 ENCOUNTER — Other Ambulatory Visit: Payer: Self-pay

## 2022-07-01 ENCOUNTER — Inpatient Hospital Stay (HOSPITAL_COMMUNITY)
Admission: EM | Admit: 2022-07-01 | Discharge: 2022-07-06 | DRG: 988 | Disposition: A | Discharge status: Home / Self Care | Payer: Commercial Managed Care - HMO | Attending: Internal Medicine | Admitting: Internal Medicine

## 2022-07-01 DIAGNOSIS — Z888 Allergy status to other drugs, medicaments and biological substances status: Secondary | ICD-10-CM | POA: Diagnosis not present

## 2022-07-01 DIAGNOSIS — E785 Hyperlipidemia, unspecified: Secondary | ICD-10-CM | POA: Diagnosis present

## 2022-07-01 DIAGNOSIS — Z7984 Long term (current) use of oral hypoglycemic drugs: Secondary | ICD-10-CM

## 2022-07-01 DIAGNOSIS — Z89512 Acquired absence of left leg below knee: Secondary | ICD-10-CM

## 2022-07-01 DIAGNOSIS — E1142 Type 2 diabetes mellitus with diabetic polyneuropathy: Secondary | ICD-10-CM | POA: Diagnosis present

## 2022-07-01 DIAGNOSIS — R739 Hyperglycemia, unspecified: Secondary | ICD-10-CM

## 2022-07-01 DIAGNOSIS — Z79899 Other long term (current) drug therapy: Secondary | ICD-10-CM

## 2022-07-01 DIAGNOSIS — Z794 Long term (current) use of insulin: Secondary | ICD-10-CM

## 2022-07-01 DIAGNOSIS — Z89021 Acquired absence of right finger(s): Secondary | ICD-10-CM

## 2022-07-01 DIAGNOSIS — Z981 Arthrodesis status: Secondary | ICD-10-CM | POA: Diagnosis not present

## 2022-07-01 DIAGNOSIS — F32A Depression, unspecified: Secondary | ICD-10-CM | POA: Diagnosis present

## 2022-07-01 DIAGNOSIS — M86142 Other acute osteomyelitis, left hand: Secondary | ICD-10-CM | POA: Diagnosis present

## 2022-07-01 DIAGNOSIS — Z823 Family history of stroke: Secondary | ICD-10-CM | POA: Diagnosis not present

## 2022-07-01 DIAGNOSIS — E1169 Type 2 diabetes mellitus with other specified complication: Principal | ICD-10-CM | POA: Diagnosis present

## 2022-07-01 DIAGNOSIS — E1152 Type 2 diabetes mellitus with diabetic peripheral angiopathy with gangrene: Secondary | ICD-10-CM | POA: Diagnosis present

## 2022-07-01 DIAGNOSIS — Z6841 Body Mass Index (BMI) 40.0 and over, adult: Secondary | ICD-10-CM

## 2022-07-01 DIAGNOSIS — M7989 Other specified soft tissue disorders: Secondary | ICD-10-CM | POA: Diagnosis present

## 2022-07-01 DIAGNOSIS — L089 Local infection of the skin and subcutaneous tissue, unspecified: Principal | ICD-10-CM | POA: Diagnosis present

## 2022-07-01 DIAGNOSIS — A419 Sepsis, unspecified organism: Secondary | ICD-10-CM

## 2022-07-01 DIAGNOSIS — E1165 Type 2 diabetes mellitus with hyperglycemia: Secondary | ICD-10-CM | POA: Diagnosis not present

## 2022-07-01 DIAGNOSIS — G4730 Sleep apnea, unspecified: Secondary | ICD-10-CM | POA: Diagnosis present

## 2022-07-01 DIAGNOSIS — N179 Acute kidney failure, unspecified: Secondary | ICD-10-CM | POA: Diagnosis present

## 2022-07-01 DIAGNOSIS — M1990 Unspecified osteoarthritis, unspecified site: Secondary | ICD-10-CM | POA: Diagnosis present

## 2022-07-01 DIAGNOSIS — E872 Acidosis, unspecified: Secondary | ICD-10-CM | POA: Diagnosis present

## 2022-07-01 DIAGNOSIS — E1140 Type 2 diabetes mellitus with diabetic neuropathy, unspecified: Secondary | ICD-10-CM

## 2022-07-01 DIAGNOSIS — K219 Gastro-esophageal reflux disease without esophagitis: Secondary | ICD-10-CM | POA: Diagnosis present

## 2022-07-01 DIAGNOSIS — R7989 Other specified abnormal findings of blood chemistry: Secondary | ICD-10-CM | POA: Diagnosis present

## 2022-07-01 DIAGNOSIS — M869 Osteomyelitis, unspecified: Secondary | ICD-10-CM | POA: Diagnosis not present

## 2022-07-01 DIAGNOSIS — F419 Anxiety disorder, unspecified: Secondary | ICD-10-CM | POA: Diagnosis present

## 2022-07-01 DIAGNOSIS — Z96651 Presence of right artificial knee joint: Secondary | ICD-10-CM | POA: Diagnosis present

## 2022-07-01 LAB — COMPREHENSIVE METABOLIC PANEL
ALT: 24 U/L (ref 0–44)
AST: 30 U/L (ref 15–41)
Albumin: 3 g/dL — ABNORMAL LOW (ref 3.5–5.0)
Alkaline Phosphatase: 139 U/L — ABNORMAL HIGH (ref 38–126)
Anion gap: 17 — ABNORMAL HIGH (ref 5–15)
BUN: 21 mg/dL — ABNORMAL HIGH (ref 6–20)
CO2: 20 mmol/L — ABNORMAL LOW (ref 22–32)
Calcium: 9 mg/dL (ref 8.9–10.3)
Chloride: 93 mmol/L — ABNORMAL LOW (ref 98–111)
Creatinine, Ser: 1.23 mg/dL — ABNORMAL HIGH (ref 0.44–1.00)
GFR, Estimated: 52 mL/min — ABNORMAL LOW (ref >60)
Glucose, Bld: 575 mg/dL (ref 70–99)
Potassium: 3.7 mmol/L (ref 3.5–5.1)
Sodium: 130 mmol/L — ABNORMAL LOW (ref 135–145)
Total Bilirubin: 0.7 mg/dL (ref 0.3–1.2)
Total Protein: 6.8 g/dL (ref 6.5–8.1)

## 2022-07-01 LAB — CBC WITH DIFFERENTIAL/PLATELET
Abs Immature Granulocytes: 0.03 10*3/uL (ref 0.00–0.07)
Basophils Absolute: 0 10*3/uL (ref 0.0–0.1)
Basophils Relative: 0 %
Eosinophils Absolute: 0.2 10*3/uL (ref 0.0–0.5)
Eosinophils Relative: 2 %
HCT: 36.8 % (ref 36.0–46.0)
Hemoglobin: 11.7 g/dL — ABNORMAL LOW (ref 12.0–15.0)
Immature Granulocytes: 0 %
Lymphocytes Relative: 20 %
Lymphs Abs: 1.4 10*3/uL (ref 0.7–4.0)
MCH: 28.5 pg (ref 26.0–34.0)
MCHC: 31.8 g/dL (ref 30.0–36.0)
MCV: 89.5 fL (ref 80.0–100.0)
Monocytes Absolute: 0.6 10*3/uL (ref 0.1–1.0)
Monocytes Relative: 8 %
Neutro Abs: 5.2 10*3/uL (ref 1.7–7.7)
Neutrophils Relative %: 70 %
Platelets: 205 10*3/uL (ref 150–400)
RBC: 4.11 MIL/uL (ref 3.87–5.11)
RDW: 14.7 % (ref 11.5–15.5)
WBC: 7.4 10*3/uL (ref 4.0–10.5)
nRBC: 0 % (ref 0.0–0.2)

## 2022-07-01 LAB — I-STAT VENOUS BLOOD GAS, ED
Acid-base deficit: 1 mmol/L (ref 0.0–2.0)
Bicarbonate: 24.9 mmol/L (ref 20.0–28.0)
Calcium, Ion: 1.2 mmol/L (ref 1.15–1.40)
HCT: 35 % — ABNORMAL LOW (ref 36.0–46.0)
Hemoglobin: 11.9 g/dL — ABNORMAL LOW (ref 12.0–15.0)
O2 Saturation: 90 %
Potassium: 3.8 mmol/L (ref 3.5–5.1)
Sodium: 131 mmol/L — ABNORMAL LOW (ref 135–145)
TCO2: 26 mmol/L (ref 22–32)
pCO2, Ven: 45.2 mmHg (ref 44–60)
pH, Ven: 7.35 (ref 7.25–7.43)
pO2, Ven: 61 mmHg — ABNORMAL HIGH (ref 32–45)

## 2022-07-01 LAB — LACTIC ACID, PLASMA
Lactic Acid, Venous: 6.1 mmol/L (ref 0.5–1.9)
Lactic Acid, Venous: 3.3 mmol/L (ref 0.5–1.9)

## 2022-07-01 LAB — CBG MONITORING, ED: Glucose-Capillary: 376 mg/dL — ABNORMAL HIGH (ref 70–99)

## 2022-07-01 LAB — I-STAT BETA HCG BLOOD, ED (MC, WL, AP ONLY): I-stat hCG, quantitative: 5.8 m[IU]/mL — ABNORMAL HIGH (ref <5)

## 2022-07-01 MED ORDER — VANCOMYCIN VARIABLE DOSE PER UNSTABLE RENAL FUNCTION (PHARMACIST DOSING): Status: DC

## 2022-07-01 MED ORDER — INSULIN ASPART 100 UNIT/ML IJ SOLN
8.0000 [IU] | Freq: Once | INTRAMUSCULAR | Status: AC
  Administered 2022-07-01: 8 [IU] via INTRAVENOUS

## 2022-07-01 MED ORDER — VANCOMYCIN HCL IN DEXTROSE 1-5 GM/200ML-% IV SOLN
1000.0000 mg | Freq: Once | INTRAVENOUS | Status: AC
  Administered 2022-07-01: 1000 mg via INTRAVENOUS
  Filled 2022-07-01: qty 200

## 2022-07-01 MED ORDER — SODIUM CHLORIDE 0.9 % IV SOLN
1.0000 g | Freq: Three times a day (TID) | INTRAVENOUS | Status: DC
  Administered 2022-07-01 – 2022-07-02 (×2): 1 g via INTRAVENOUS
  Filled 2022-07-01 (×4): qty 10

## 2022-07-01 MED ORDER — VANCOMYCIN HCL IN DEXTROSE 1-5 GM/200ML-% IV SOLN
1000.0000 mg | Freq: Once | INTRAVENOUS | Status: AC
  Administered 2022-07-02: 1000 mg via INTRAVENOUS
  Filled 2022-07-01: qty 200

## 2022-07-01 MED ORDER — SODIUM CHLORIDE 0.9 % IV BOLUS (SEPSIS)
1000.0000 mL | Freq: Once | INTRAVENOUS | Status: AC
  Administered 2022-07-01: 1000 mL via INTRAVENOUS

## 2022-07-01 MED ORDER — SODIUM CHLORIDE 0.9 % IV BOLUS (SEPSIS)
800.0000 mL | Freq: Once | INTRAVENOUS | Status: AC
  Administered 2022-07-01: 800 mL via INTRAVENOUS

## 2022-07-01 NOTE — Sepsis Progress Note (Addendum)
Elink monitoring for the code sepsis protocol.  

## 2022-07-01 NOTE — Progress Notes (Signed)
Pharmacy Antibiotic Note  [name] is a 57 y.o. female for which pharmacy has been consulted for vancomycin dosing for cellulitis.  Patient with a history of DM. 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 Patient presenting with worsening infection in her left middle finger.  SCr 1.23 - above baseline WBC 7.4; LA 6.1; T 99.6; HR 87; RR 18  Plan: Rocephin and vancomycin allergy noted in chart. Since input of those allergies in chart patient has received multiple courses of cephalosporins and vancomycin without documented issues.  Vancomycin 1g given in ED will give additional 1g Vanco now to complete 2g load. ---given Scr slightly above baseline and patient's history of AKI on vancomycin will tentatively plan for dosing per level unless renal function is stable in the AM and AUC dosing can be utilized Cefepime per MD  Trend WBC, Fever, Renal function F/u cultures, clinical course, WBC De-escalate when able  Height: '5\' 5"'$  (165.1 cm) Weight: 117.9 kg (259 lb 14.8 oz) IBW/kg (Calculated) : 57  Temp (24hrs), Avg:99.4 F (37.4 C), Min:99.4 F (37.4 C), Max:99.4 F (37.4 C)  Recent Labs  Lab 07/01/22 1840  WBC 7.4  CREATININE 1.23*  LATICACIDVEN 6.1*    Estimated Creatinine Clearance: 65.6 mL/min (A) (by C-G formula based on SCr of 1.23 mg/dL (H)).    Allergies  Allergen Reactions   Rocephin [Ceftriaxone] Shortness Of Breath and Swelling    Tolerated course of Rocephin 6/19 and multiple courses of Ancef/Keflex as well as Cefepime   Daptomycin Other (See Comments)    Chills and lower back pain; Patient states had issues due to this medication and Vancomycin ran at the same time; Patient states related to infected PICC Line    Vancomycin Other (See Comments)    Drives creatinine level(s) "through the roof" and they didn't return to normal for approx 14 days Reaction happened in march, has since had vancomycin without problem. Patient  states was related to infected PICC Line; after PICC changed no problems.   Microbiology results: Pending  Thank you for allowing pharmacy to be a part of this patient's care.  Lorelei Pont, PharmD, BCPS 07/01/2022 8:38 PM ED Clinical Pharmacist -  [phone]

## 2022-07-01 NOTE — ED Provider Triage Note (Signed)
Emergency Medicine Provider Triage Evaluation Note  [name] , a 57 y.o. female  was evaluated in triage.  Pt complains of left middle finger infection.  States she has had this for a while but got significantly worse today.  Got home at 4 PM and noticed red streaking on the dorsum of her left hand.  Went to [employer] and was encouraged to come to the ED for admission and possible surgery.  Review of Systems  Positive: As above Negative: As above  Physical Exam  BP 134/62 (BP Location: Right Arm)   Pulse 96   Temp 99.4 F (37.4 C) (Oral)   Resp 18   Ht '5\' 5"'$  (1.651 m)   Wt 117.9 kg   SpO2 99%   BMI 43.25 kg/m  Gen:   Awake, no distress   Resp:  Normal effort  MSK:   Moves extremities without difficulty  Other:  Significant swelling of the left middle finger with erythema to the distal aspect and erythematous streaking over the dorsal portion of the hand into the forearm  Medical Decision Making  Medically screening exam initiated at 6:31 PM.  Appropriate orders placed.  [name] was informed that the remainder of the evaluation will be completed by another provider, this initial triage assessment does not replace that evaluation, and the importance of remaining in the ED until their evaluation is complete.     Roylene Reason, [place] 07/01/22 1832

## 2022-07-01 NOTE — ED Provider Notes (Signed)
[facility] Provider Note   CSN: [id number] Arrival date & time: 07/01/22  1808     History  Chief Complaint  Patient presents with   Finger Injury    [name] is a 57 y.o. female.  HPI   Pt has a history of diabetes, arthritis neuropathy, amputations of fingers and lower extremities in the past.  Patient presents to the ED for evaluation of worsening infection in her left middle finger.  Patient states she has had wound on that finger for several weeks.  She has been managing at home.  She went to [facility] because she noticed some increase in redness and swelling recently.  Patient states today she noticed she started having streaking redness going up her forearm.  She is felt somewhat chilled but and measured a fever today.  She came to the ED for evaluation.  [facility] felt she would likely require operative intervention  Home Medications Prior to Admission medications   Medication Sig Start Date End Date Taking? Authorizing Provider  diclofenac (VOLTAREN) 75 MG EC tablet Take 75 mg by mouth 2 (two) times daily.  02/28/14   [provider]  DULoxetine (CYMBALTA) 60 MG capsule Take 60 mg by mouth daily.    [provider]  gabapentin (NEURONTIN) 300 MG capsule Take 300 mg by mouth 2 (two) times daily. 04/23/19   [provider]  glipiZIDE (GLUCOTROL XL) 10 MG 24 hr tablet Take 10 mg by mouth daily with breakfast.  05/23/17   [provider]  glucosamine-chondroitin 500-400 MG tablet Take 2 tablets by mouth daily.     [provider]  HYDROcodone-acetaminophen (NORCO) 10-325 MG tablet Take 1 tablet by mouth every 6 (six) hours as needed for moderate pain.  07/02/16   [provider]  Multiple Vitamin (MULTIVITAMIN WITH MINERALS) TABS Take 1 tablet by mouth daily.    [provider]  mupirocin nasal ointment (BACTROBAN) 2 % Place 1 application into the nose 2 (two) times  daily. Use one-half of tube in each nostril twice daily for five (5) days. After application, press sides of nose together and gently massage. Patient taking differently: Place 1 application into the nose 2 (two) times daily as needed (infection). 03/18/18   Campbell Riches, MD  ondansetron (ZOFRAN-ODT) 8 MG disintegrating tablet Take 8 mg by mouth every 8 (eight) hours as needed for nausea/vomiting. DISSOLVE IN THE MOUTH 04/30/16   [provider]  oxybutynin (DITROPAN-XL) 10 MG 24 hr tablet Take 10 mg by mouth daily.    [provider]  SitaGLIPtin-MetFORMIN HCl 50-1000 MG TB24 Take 1 tablet by mouth at bedtime. 11/21/15   [provider]  TRULICITY [id number] [id number] SOPN Inject 0.75 mg into the skin once a week. 07/15/20   [provider]      Allergies    Rocephin [ceftriaxone], Daptomycin, and Vancomycin    Review of Systems   Review of Systems  Physical Exam Updated Vital Signs BP (!) 147/75   Pulse 87   Temp 99.6 F (37.6 C) (Oral)   Resp 18   Ht 1.651 m ('5\' 5"'$ )   Wt 117.9 kg   SpO2 100%   BMI 43.25 kg/m  Physical Exam Vitals and nursing note reviewed.  Constitutional:      Appearance: She is well-developed. She is not diaphoretic.  HENT:     Head: Normocephalic and atraumatic.     Right Ear: External ear normal.  Left Ear: External ear normal.  Eyes:     General: No scleral icterus.       Right eye: No discharge.        Left eye: No discharge.     Conjunctiva/sclera: Conjunctivae normal.  Neck:     Trachea: No tracheal deviation.  Cardiovascular:     Rate and Rhythm: Normal rate and regular rhythm.  Pulmonary:     Effort: Pulmonary effort is normal. No respiratory distress.     Breath sounds: Normal breath sounds. No stridor. No wheezing or rales.  Abdominal:     General: Bowel sounds are normal. There is no distension.     Palpations: Abdomen is soft.     Tenderness: There is no abdominal tenderness. There is no guarding or  rebound.  Musculoskeletal:        General: Swelling and tenderness present. No deformity.     Cervical back: Neck supple.     Comments: Erythema and swelling middle finger, lymphangitic streaking forearm, callused scab at distal aspect, status post prior amputations of the digits as well as lower extremity below the knee amputation  Skin:    General: Skin is warm and dry.     Findings: No rash.  Neurological:     General: No focal deficit present.     Mental Status: She is alert.     Cranial Nerves: No cranial nerve deficit, dysarthria or facial asymmetry.     Sensory: No sensory deficit.     Motor: No abnormal muscle tone or seizure activity.     Coordination: Coordination normal.  Psychiatric:        Mood and Affect: Mood normal.     ED Results / Procedures / Treatments   Labs (all labs ordered are listed, but only abnormal results are displayed) Labs Reviewed  COMPREHENSIVE METABOLIC PANEL - Abnormal; Notable for the following components:      Result Value   Sodium 130 (*)    Chloride 93 (*)    CO2 20 (*)    Glucose, Bld 575 (*)    BUN 21 (*)    Creatinine, Ser 1.23 (*)    Albumin 3.0 (*)    Alkaline Phosphatase 139 (*)    GFR, Estimated 52 (*)    Anion gap 17 (*)    All other components within normal limits  CBC WITH DIFFERENTIAL/PLATELET - Abnormal; Notable for the following components:   Hemoglobin 11.7 (*)    All other components within normal limits  LACTIC ACID, PLASMA - Abnormal; Notable for the following components:   Lactic Acid, Venous 6.1 (*)    All other components within normal limits  I-STAT BETA HCG BLOOD, ED ([facility], [facility], [facility] ONLY) - Abnormal; Notable for the following components:   I-stat hCG, quantitative 5.8 (*)    All other components within normal limits  I-STAT VENOUS BLOOD GAS, ED - Abnormal; Notable for the following components:   pO2, Ven 61 (*)    Sodium 131 (*)    HCT 35.0 (*)    Hemoglobin 11.9 (*)    All other components within normal  limits  CULTURE, BLOOD (ROUTINE X 2)  CULTURE, BLOOD (ROUTINE X 2)  LACTIC ACID, PLASMA  CBG MONITORING, ED    EKG None  Radiology DG Hand Complete Left  Result Date: 07/01/2022 CLINICAL DATA:  Pain and swelling left third finger EXAM: LEFT HAND - COMPLETE 3+ VIEW COMPARISON:  03/08/2022 FINDINGS: There is marked soft tissue swelling in the middle  phalanx. There is previous almost complete amputation of distal phalanx of the middle finger. There is previous removal of distal portion of the middle phalanx and distal phalanx in the index finger. There is interval healing of radiolucent line seen in the shaft of distal phalanx of fourth finger. No demonstrable focal lytic lesions. There are no abnormal pockets of air in the soft tissues. Degenerative changes are noted with bony spurs in first carpometacarpal joint and some of the metacarpophalangeal and interphalangeal joints. There are no opaque foreign bodies. IMPRESSION: There is interval appearance of marked soft tissue swelling in the middle finger. There is no definite demonstrable focal lytic lesion. If there is clinical suspicion for osteomyelitis, follow-up MRI should be considered. Other findings as described in the body of the report. Electronically Signed   By: Elmer Picker M.D.   On: 07/01/2022 19:22    Procedures .Critical Care  Performed by: Dorie Rank, MD Authorized by: Dorie Rank, MD   Critical care provider statement:    Critical care time (minutes):  35   Critical care was time spent personally by me on the following activities:  Development of treatment plan with patient or surrogate, discussions with consultants, evaluation of patient's response to treatment, examination of patient, ordering and review of laboratory studies, ordering and review of radiographic studies, ordering and performing treatments and interventions, pulse oximetry, re-evaluation of patient's condition and review of old charts     Medications  Ordered in ED Medications  ceFEPIme (MAXIPIME) 1 g in sodium chloride 0.9 % 100 mL IVPB (1 g Intravenous New Bag/Given 07/01/22 2138)  sodium chloride 0.9 % bolus 1,000 mL (0 mLs Intravenous Stopped 07/01/22 2103)    And  sodium chloride 0.9 % bolus 800 mL (0 mLs Intravenous Stopped 07/01/22 2103)  vancomycin (VANCOCIN) IVPB 1000 mg/200 mL premix (0 mg Intravenous Stopped 07/01/22 2138)  insulin aspart (novoLOG) injection 8 Units (8 Units Intravenous Given 07/01/22 2036)    ED Course/ Medical Decision Making/ A&P Clinical Course as of 07/01/22 2144  Wed Jul 01, 2022  2124 Comprehensive metabolic panel(!!) Anion gap slightly increased.  Venous pH normal.  Will need to monitor closely but no clear signs of DKA at this time [JK]  2127 Discussed with Dr. Alma Friendly.  Dr. Tempie Donning plans on taking the patient to the OR tomorrow.  He recommends n.p.o. past midnight [JK]  2144 Case discussed with Dr. Nevada Crane regarding admission [JK]    Clinical Course User Index [JK] Dorie Rank, MD                             Medical Decision Making Problems Addressed: Finger infection: acute illness or injury that poses a threat to life or bodily functions Hyperglycemia: acute illness or injury that poses a threat to life or bodily functions  Amount and/or Complexity of Data Reviewed Labs:  Decision-making details documented in ED Course. ECG/medicine tests: ordered.  Risk Prescription drug management. Decision regarding hospitalization.   Presented to the ER for evaluation of a worsening finger infection.  Patient has history of osteomyelitis and amputations of her digit.  Patient noticed increasing redness and swelling concerning for evolving infection.  Patient started to feel febrile today.  Patient normotensive and afebrile but her labs are concerning for an elevated lactic acid level at 6.1.  Patient has been treated with IV fluid and antibiotics for possible evolving sepsis.  Patient's laboratory tests are also  notable for hyperglycemia  with AKI.  She also has an elevated anion gap.  Concerned about the possibility of evolving DKA but pH is normal at this time.  Patient was given dose of insulin and IV fluids.  We will continue to monitor closely.  I will consult the medical service for admission        Final Clinical Impression(s) / ED Diagnoses Final diagnoses:  Finger infection  Hyperglycemia  Sepsis, due to unspecified organism, unspecified whether acute organ dysfunction present [facility])    Rx / DC Orders ED Discharge Orders     None         Dorie Rank, MD 07/01/22 2144

## 2022-07-01 NOTE — ED Notes (Signed)
Critical lab reported to Dr. Tomi Bamberger and primary RN

## 2022-07-01 NOTE — ED Triage Notes (Signed)
Pt c/o pain and swelling to her lt middle finger  initial burn 6 weeks ago   more red and swollen for the past several hours

## 2022-07-01 NOTE — H&P (Signed)
History and Physical  [name] [id number] DOB: May 03, 1965 DOA: 07/01/2022  Referring physician: Dr. Tomi Bamberger, [place] PCP: Chesley Noon, MD  Outpatient Specialists: Orthopedic surgery. Patient coming from: Home.  Chief Complaint: Left middle finger infection.  HPI: [name] is a 57 y.o. female with medical history significant for severe Polyneuropathy, type 2 diabetes, status post left leg amputation, who presented to [facility] ED at the recommendation of orthopedic surgery due to left middle finger infection.  6 weeks ago she had a burn involving her left third finger.  She tried to manage it at home herself.  However 2 weeks ago she started noticing swelling.  Yesterday morning her finger appeared more swollen and today she noted red streaks that progressed to her left upper arm.  She went to her orthopedic surgeon to be evaluated.  They recommended that she comes to the ED for further management.  In the ED, workup concerning for left middle finger infection.  Started on empiric IV antibiotics cefepime and IV vancomycin.  EDP discussed the case with orthopedic surgery.  They recommended admission by hospitalist service, n.p.o. after midnight for possible digit amputation.  Admitted by [facility].  ED Course: Tmax 99.6.  BP 159/72, pulse 96, respiratory 17, saturation 98% on room air.  Lab studies remarkable for WBC 6.1.  Hemoglobin 10.6.  MRSA screening test negative.  Serum sodium 131, serum bicarb 20, serum glucose 575.  Alkaline phosphatase 139, lactic acid 6.1, 3.3.  Review of Systems: Review of systems as noted in the HPI. All other systems reviewed and are negative.   Past Medical History:  Diagnosis Date   Arthritis    Diabetes mellitus without complication (HCC)    DOE (dyspnea on exertion) 07/12/2017   FUO (fever of unknown origin) 07/12/2017   Gastroenteritis 04/2016   Headache(784.0)    hormonal headache   Low back pain 07/12/2017   Neuropathy    bilateral feet   Obese     Pleuritic chest pain 07/12/2017   Pneumonia    hx 20+ years ago   Sleep apnea    bipap 1 yr   Ulcer of foot (HCC)    toe ulcer greater than a year ago   Past Surgical History:  Procedure Laterality Date   AMPUTATION Right 06/11/2017   Procedure: RIGHT LONG FINGER AMPUTATION DIGIT;  Surgeon: Milly Jakob, MD;  Location: [facility];  Service: Orthopedics;  Laterality: Right;   AMPUTATION Right 02/22/2018   Procedure: AMPUTATION RIGHT INDEX FINGER;  Surgeon: Dayna Barker, MD;  [facility];  Service: Plastics;  Laterality: Right;   ANKLE FUSION Left 08/03/2014   Procedure: Left Tibiocalcaneal Fusion;  Surgeon: Newt Minion, MD;  Location: [place];  Service: Orthopedics;  Laterality: Left;   APPLICATION OF WOUND VAC Left 08/03/2014   Procedure: APPLICATION OF WOUND VAC;  Surgeon: Newt Minion, MD;  Location: [facility];  Service: Orthopedics;  Laterality: Left;   I & D EXTREMITY Left 08/01/2020   Procedure: IRRIGATION AND DEBRIDEMENT EXTREMITY,PARTIAL AMPUTATION LEFT INDEX FINGER;  Surgeon: Verner Mould, MD;  Location[facility];  Service: Orthopedics;  Laterality: Left;   I & D EXTREMITY Left 08/06/2020   Procedure: IRRIGATION AND DEBRIDEMENT EXTREMITY;  Surgeon: Verner Mould, MD;  [facility];  Service: Orthopedics;  Laterality: Left;   I & D KNEE WITH POLY EXCHANGE Right 01/31/2013   Procedure: IRRIGATION AND DEBRIDEMENT KNEE WITH POLY EXCHANGE possible Antibiotic Spacer;  Surgeon: Garald Balding, MD;  Location: [place];  Service: Orthopedics;  Laterality: Right;   INCISION AND DRAINAGE Right 01/31/2013   ANTIBIOTIC SPACER  RIGHT KNEE  DR [facility]    INCISION AND DRAINAGE ABSCESS Left 08/03/2020   Procedure: INCISION AND DEBRIDEMENT LEFT INDEX FINGER;  Surgeon: Verner Mould, MD;  [facility];  Service: Orthopedics;  Laterality: Left;   KNEE ARTHROSCOPY     left knee   ORIF ANKLE FRACTURE Left 03/16/2014   Procedure: Foot Excision Charcot Collapse,  Internal  Fixation;  Surgeon: Newt Minion, MD;  Location: [place];  Service: Orthopedics;  Laterality: Left;   REPLACEMENT TOTAL KNEE     left   TOTAL KNEE ARTHROPLASTY  06/23/2011   Procedure: TOTAL KNEE ARTHROPLASTY;  Surgeon: Garald Balding, MD;  Location: [place];  Service: Orthopedics;  Laterality: Right;  RIGHT TOTAL KNEE REPLACEMENT     Social History:  reports that she has never smoked. She has never used smokeless tobacco. She reports current alcohol use. She reports that she does not use drugs.   Allergies  Allergen Reactions   Rocephin [Ceftriaxone] Shortness Of Breath and Swelling    Tolerated course of Rocephin 6/19 and multiple courses of Ancef/Keflex as well as Cefepime   Daptomycin Other (See Comments)    Chills and lower back pain; Patient states had issues due to this medication and Vancomycin ran at the same time; Patient states related to infected PICC Line    Vancomycin Other (See Comments)    Drives creatinine level(s) "through the roof" and they didn't return to normal for approx 14 days Reaction happened in march, has since had vancomycin without problem. Patient states was related to infected PICC Line; after PICC changed no problems.    Family History  Problem Relation Age of Onset   Anesthesia problems Father    Stroke Father    Hypotension Neg Hx    Malignant hyperthermia Neg Hx    Pseudochol deficiency Neg Hx       Prior to Admission medications   Medication Sig Start Date End Date Taking? Authorizing Provider  diclofenac (VOLTAREN) 75 MG EC tablet Take 75 mg by mouth 2 (two) times daily.  02/28/14   [provider]  DULoxetine (CYMBALTA) 60 MG capsule Take 60 mg by mouth daily.    [provider]  gabapentin (NEURONTIN) 300 MG capsule Take 300 mg by mouth 2 (two) times daily. 04/23/19   [provider]  glipiZIDE (GLUCOTROL XL) 10 MG 24 hr tablet Take 10 mg by mouth daily with breakfast.  05/23/17   [provider]   glucosamine-chondroitin 500-400 MG tablet Take 2 tablets by mouth daily.     [provider]  HYDROcodone-acetaminophen (NORCO) 10-325 MG tablet Take 1 tablet by mouth every 6 (six) hours as needed for moderate pain.  07/02/16   [provider]  Multiple Vitamin (MULTIVITAMIN WITH MINERALS) TABS Take 1 tablet by mouth daily.    [provider]  mupirocin nasal ointment (BACTROBAN) 2 % Place 1 application into the nose 2 (two) times daily. Use one-half of tube in each nostril twice daily for five (5) days. After application, press sides of nose together and gently massage. Patient taking differently: Place 1 application into the nose 2 (two) times daily as needed (infection). 03/18/18   Campbell Riches, MD  ondansetron (ZOFRAN-ODT) 8 MG disintegrating tablet Take 8 mg by mouth every 8 (eight) hours as needed for nausea/vomiting. DISSOLVE IN THE MOUTH 04/30/16  [provider]  oxybutynin (DITROPAN-XL) 10 MG 24 hr tablet Take 10 mg by mouth daily.    [provider]  SitaGLIPtin-MetFORMIN HCl 50-1000 MG TB24 Take 1 tablet by mouth at bedtime. 11/21/15   [provider]  TRULICITY [id number] 0000000 SOPN Inject 0.75 mg into the skin once a week. 07/15/20   [provider]    Physical Exam: BP (!) 147/75   Pulse 87   Temp 99.6 F (37.6 C) (Oral)   Resp 18   Ht '5\' 5"'$  (1.651 m)   Wt 117.9 kg   SpO2 100%   BMI 43.25 kg/m   General: 57 y.o. year-old female well developed well nourished in no acute distress.  Alert and oriented x3. Cardiovascular: Regular rate and rhythm with no rubs or gallops.  No thyromegaly or JVD noted.  No lower extremity edema. 2/4 pulses in all 4 extremities. Respiratory: Clear to auscultation with no wheezes or rales. Good inspiratory effort. Abdomen: Soft nontender nondistended with normal bowel sounds x4 quadrants. Muskuloskeletal: No cyanosis, clubbing or edema noted bilaterally Neuro: CN II-XII intact,  strength, sensation, reflexes Skin:  Psychiatry: Judgement and insight appear normal. Mood is appropriate for condition and setting          Labs on Admission:  Basic Metabolic Panel: Recent Labs  Lab 07/01/22 1840 07/01/22 2056  NA 130* 131*  K 3.7 3.8  CL 93*  --   CO2 20*  --   GLUCOSE 575*  --   BUN 21*  --   CREATININE 1.23*  --   CALCIUM 9.0  --    Liver Function Tests: Recent Labs  Lab 07/01/22 1840  AST 30  ALT 24  ALKPHOS 139*  BILITOT 0.7  PROT 6.8  ALBUMIN 3.0*   No results for input(s): "LIPASE", "AMYLASE" in the last 168 hours. No results for input(s): "AMMONIA" in the last 168 hours. CBC: Recent Labs  Lab 07/01/22 1840 07/01/22 2056  WBC 7.4  --   NEUTROABS 5.2  --   HGB 11.7* 11.9*  HCT 36.8 35.0*  MCV 89.5  --   PLT 205  --    Cardiac Enzymes: No results for input(s): "CKTOTAL", "CKMB", "CKMBINDEX", "TROPONINI" in the last 168 hours.  BNP (last 3 results) No results for input(s): "BNP" in the last 8760 hours.  ProBNP (last 3 results) No results for input(s): "PROBNP" in the last 8760 hours.  CBG: No results for input(s): "GLUCAP" in the last 168 hours.  Radiological Exams on Admission: DG Hand Complete Left  Result Date: 07/01/2022 CLINICAL DATA:  Pain and swelling left third finger EXAM: LEFT HAND - COMPLETE 3+ VIEW COMPARISON:  03/08/2022 FINDINGS: There is marked soft tissue swelling in the middle phalanx. There is previous almost complete amputation of distal phalanx of the middle finger. There is previous removal of distal portion of the middle phalanx and distal phalanx in the index finger. There is interval healing of radiolucent line seen in the shaft of distal phalanx of fourth finger. No demonstrable focal lytic lesions. There are no abnormal pockets of air in the soft tissues. Degenerative changes are noted with bony spurs in first carpometacarpal joint and some of the metacarpophalangeal and interphalangeal joints. There are no  opaque foreign bodies. IMPRESSION: There is interval appearance of marked soft tissue swelling in the middle finger. There is no definite demonstrable focal lytic lesion. If there is clinical suspicion for osteomyelitis, follow-up MRI should be considered. Other findings as described in the  body of the report. Electronically Signed   By: Elmer Picker M.D.   On: 07/01/2022 19:22    EKG: I independently viewed the EKG done and my findings are as followed: Sinus rhythm 89.  Nonspecific ST-T changes.  QTc 440.  Assessment/Plan Present on Admission:  Finger infection  Principal Problem:   Finger infection  Left middle finger infection, POA. Continue broad-spectrum IV antibiotics, cefepime and IV vancomycin. Continue IV fluid while on IV vancomycin, NS at 50 cc/h x 2 days. MRSA screening test negative. Follow peripheral blood cultures x 2. Monitor fever curve and WBC. Maintain MAP greater than 65. Orthopedic surgery will see in consultation. N.p.o. after midnight for possible digit amputation.  Type 2 diabetes with hyperglycemia Last hemoglobin A1c 7.7 on 08/01/2020. Obtain hemoglobin A1c Start insulin sliding scale.  Mild high anion gap metabolic acidosis Serum bicarb 20, anion gap 17 Continue IV fluid, treat hyperglycemia. Repeat BMP.  Diabetes polyneuropathy Resume home regimen. Fall precautions.  Chronic anxiety/depression Resume home duloxetine.  GERD Resume home PPI  Hyperlipidemia Resume home Crestor  Severe morbid obesity BMI 46 Recommend weight loss outpatient with regular physical activity and healthy dieting.   DVT prophylaxis: Subcu Lovenox daily.  Code Status: Full code as stated by the patient herself.  Family Communication: None at bedside.  Disposition Plan: Admitted to telemetry surgical unit  Consults called: Orthopedic surgery consulted by EDP.  Admission status: Inpatient status.   Status is: Inpatient The patient requires at least 2  midnights for further evaluation and treatment of present condition.   Kayleen Memos MD [facility] Pager [phone]  If 7PM-7AM, please contact night-coverage [email or web address] Password [facility]  07/01/2022, 9:41 PM

## 2022-07-01 NOTE — ED Notes (Signed)
ED TO INPATIENT HANDOFF REPORT  ED Nurse Name and Phone #: Rip Harbour RN [phone]  S Name/Age/Gender [name] 57 y.o. female Room/Bed: 035C/035C  Code Status   Code Status: Prior  Home/SNF/Other Home Patient oriented to: self, place, time, and situation Is this baseline? Yes   Triage Complete: Triage complete  Chief Complaint Finger infection [L08.9]  Triage Note Pt c/o pain and swelling to her lt middle finger  initial burn 6 weeks ago   more red and swollen for the past several hours   Allergies Allergies  Allergen Reactions   Rocephin [Ceftriaxone] Shortness Of Breath and Swelling    Tolerated course of Rocephin 6/19 and multiple courses of Ancef/Keflex as well as Cefepime   Daptomycin Other (See Comments)    Chills and lower back pain; Patient states had issues due to this medication and Vancomycin ran at the same time; Patient states related to infected PICC Line    Vancomycin Other (See Comments)    Drives creatinine level(s) "through the roof" and they didn't return to normal for approx 14 days Reaction happened in march, has since had vancomycin without problem. Patient states was related to infected PICC Line; after PICC changed no problems.    Level of Care/Admitting Diagnosis ED Disposition     ED Disposition  Admit   Condition  --   Young: [facility] [[id number]]  Level of Care: Telemetry Surgical [105]  May admit patient to [facility] or [facility] if equivalent level of care is available:: No  Covid Evaluation: Asymptomatic - no recent exposure (last 10 days) testing not required  Diagnosis: Finger infection [id number]  Admitting Physician: Kayleen Memos [id number]  Attending Physician: Kayleen Memos [id number]  Certification:: I certify this patient will need inpatient services for at least 2 midnights  Estimated Length of Stay: 2          B Medical/Surgery History Past Medical History:  Diagnosis  Date   Arthritis    Diabetes mellitus without complication ([place])    DOE (dyspnea on exertion) 07/12/2017   FUO (fever of unknown origin) 07/12/2017   Gastroenteritis 04/2016   Headache(784.0)    hormonal headache   Low back pain 07/12/2017   Neuropathy    bilateral feet   Obese    Pleuritic chest pain 07/12/2017   Pneumonia    hx 20+ years ago   Sleep apnea    bipap 1 yr   Ulcer of foot ([place])    toe ulcer greater than a year ago   Past Surgical History:  Procedure Laterality Date   AMPUTATION Right 06/11/2017   Procedure: RIGHT LONG FINGER AMPUTATION DIGIT;  Surgeon: Milly Jakob, MD;  Location: [facility];  Service: Orthopedics;  Laterality: Right;   AMPUTATION Right 02/22/2018   Procedure: AMPUTATION RIGHT INDEX FINGER;  Surgeon: Dayna Barker, MD;  Location: [facility];  Service: Plastics;  Laterality: Right;   ANKLE FUSION Left 08/03/2014   Procedure: Left Tibiocalcaneal Fusion;  Surgeon: Newt Minion, MD;  Location: [facility];  Service: Orthopedics;  Laterality: Left;   APPLICATION OF WOUND VAC Left 08/03/2014   Procedure: APPLICATION OF WOUND VAC;  Surgeon: Newt Minion, MD;  Location: [facility];  Service: Orthopedics;  Laterality: Left;   I & D EXTREMITY Left 08/01/2020   Procedure: IRRIGATION AND DEBRIDEMENT EXTREMITY,PARTIAL AMPUTATION LEFT INDEX FINGER;  Surgeon: Verner Mould, MD;  Location: [facility];  Service: Orthopedics;  Laterality: Left;  I & D EXTREMITY Left 08/06/2020   Procedure: IRRIGATION AND DEBRIDEMENT EXTREMITY;  Surgeon: Verner Mould, MD;  Location: [facility];  Service: Orthopedics;  Laterality: Left;   I & D KNEE WITH POLY EXCHANGE Right 01/31/2013   Procedure: IRRIGATION AND DEBRIDEMENT KNEE WITH POLY EXCHANGE possible Antibiotic Spacer;  Surgeon: Garald Balding, MD;  Location: [facility];  Service: Orthopedics;  Laterality: Right;   INCISION AND DRAINAGE Right 01/31/2013   ANTIBIOTIC SPACER  RIGHT KNEE  DR [facility]    INCISION AND DRAINAGE ABSCESS Left 08/03/2020    Procedure: INCISION AND DEBRIDEMENT LEFT INDEX FINGER;  Surgeon: Verner Mould, MD;  Location: [facility];  Service: Orthopedics;  Laterality: Left;   KNEE ARTHROSCOPY     left knee   ORIF ANKLE FRACTURE Left 03/16/2014   Procedure: Foot Excision Charcot Collapse,  Internal Fixation;  Surgeon: Newt Minion, MD;  Location: [facility] [place];  Service: Orthopedics;  Laterality: Left;   REPLACEMENT TOTAL KNEE     left   TOTAL KNEE ARTHROPLASTY  06/23/2011   Procedure: TOTAL KNEE ARTHROPLASTY;  Surgeon: Garald Balding, MD;  Location: [facility];  Service: Orthopedics;  Laterality: Right;  RIGHT TOTAL KNEE REPLACEMENT      A IV Location/Drains/Wounds Patient Lines/Drains/Airways Status     Active Line/Drains/Airways     Name Placement date Placement time Site Days   Peripheral IV 07/01/22 Anterior;Proximal;Right Forearm 07/01/22  2015  Forearm  less than 1   Peripheral IV 07/01/22 20 G Anterior;Left;Proximal Forearm 07/01/22  2030  Forearm  less than 1   Incision (Closed) 02/22/18 Finger (Comment which one) Right 02/22/18  1244  -- 1590   Incision (Closed) 08/06/20 Hand Left 08/06/20  1856  -- 694   Wound / Incision (Open or Dehisced) 06/10/17 Other (Comment) Finger (Comment which one) Right right middle finger 06/10/17  0856  Finger (Comment which one)  1847   Wound / Incision (Open or Dehisced) 08/07/20 Incision - Open Finger (Comment which one) Left;Anterior;Posterior;Distal PT HYDROTHERAPY ONLY incision at distal finger extending proximally into palm 08/07/20  1300  Finger (Comment which one)  693            Intake/Output Last 24 hours  Intake/Output Summary (Last 24 hours) at 07/01/2022 2209 Last data filed at 07/01/2022 2138 Gross per 24 hour  Intake 2031.18 ml  Output --  Net 2031.18 ml    Labs/Imaging Results for orders placed or performed during the hospital encounter of 07/01/22 (from the past 48 hour(s))  Comprehensive metabolic panel     Status: Abnormal   Collection Time:  07/01/22  6:40 PM  Result Value Ref Range   Sodium 130 (L) 135 - 145 mmol/L   Potassium 3.7 3.5 - 5.1 mmol/L   Chloride 93 (L) 98 - 111 mmol/L   CO2 20 (L) 22 - 32 mmol/L   Glucose, Bld 575 (HH) 70 - 99 mg/dL    Comment: CRITICAL RESULT CALLED TO, READ BACK BY AND VERIFIED WITH C Cadott 07/01/2022 WBOND Glucose reference range applies only to samples taken after fasting for at least 8 hours.    BUN 21 (H) 6 - 20 mg/dL   Creatinine, Ser 1.23 (H) 0.44 - 1.00 mg/dL   Calcium 9.0 8.9 - 10.3 mg/dL   Total Protein 6.8 6.5 - 8.1 g/dL   Albumin 3.0 (L) 3.5 - 5.0 g/dL   AST 30 15 - 41 U/L   ALT 24 0 - 44 U/L  Alkaline Phosphatase 139 (H) 38 - 126 U/L   Total Bilirubin 0.7 0.3 - 1.2 mg/dL   GFR, Estimated 52 (L) >60 mL/min    Comment: (NOTE) Calculated using the CKD-EPI Creatinine Equation (2021)    Anion gap 17 (H) 5 - 15    Comment: Performed at [facility] [address] [place], [place] [address]  CBC with Differential     Status: Abnormal   Collection Time: 07/01/22  6:40 PM  Result Value Ref Range   WBC 7.4 4.0 - 10.5 K/uL   RBC 4.11 3.87 - 5.11 MIL/uL   Hemoglobin 11.7 (L) 12.0 - 15.0 g/dL   HCT 36.8 36.0 - 46.0 %   MCV 89.5 80.0 - 100.0 fL   MCH 28.5 26.0 - 34.0 pg   MCHC 31.8 30.0 - 36.0 g/dL   RDW 14.7 11.5 - 15.5 %   Platelets 205 150 - 400 K/uL   nRBC 0.0 0.0 - 0.2 %   Neutrophils Relative % 70 %   Neutro Abs 5.2 1.7 - 7.7 K/uL   Lymphocytes Relative 20 %   Lymphs Abs 1.4 0.7 - 4.0 K/uL   Monocytes Relative 8 %   Monocytes Absolute 0.6 0.1 - 1.0 K/uL   Eosinophils Relative 2 %   Eosinophils Absolute 0.2 0.0 - 0.5 K/uL   Basophils Relative 0 %   Basophils Absolute 0.0 0.0 - 0.1 K/uL   Immature Granulocytes 0 %   Abs Immature Granulocytes 0.03 0.00 - 0.07 K/uL    Comment: Performed at [facility] [address], [place], [place] [address]  Lactic acid, plasma     Status: Abnormal   Collection Time: 07/01/22  6:40 PM  Result Value Ref  Range   Lactic Acid, Venous 6.1 (HH) 0.5 - 1.9 mmol/L    Comment: CRITICAL RESULT CALLED TO, READ BACK BY AND VERIFIED WITH C [place] 07/01/2022 WBOND Performed at [facility], [place] [address] [place], [place] [address]   I-Stat beta hCG blood, ED     Status: Abnormal   Collection Time: 07/01/22  8:53 PM  Result Value Ref Range   I-stat hCG, quantitative 5.8 (H) <5 mIU/mL   Comment 3            Comment:   GEST. AGE      CONC.  (mIU/mL)   <=1 WEEK        5 - 50     2 WEEKS       50 - 500     3 WEEKS       100 - 10,000     4 WEEKS     1,000 - 30,000        FEMALE AND NON-PREGNANT FEMALE:     LESS THAN 5 mIU/mL   I-Stat venous blood gas, ([facility], [facility], [facility])     Status: Abnormal   Collection Time: 07/01/22  8:56 PM  Result Value Ref Range   pH, Ven 7.350 7.25 - 7.43   pCO2, Ven 45.2 44 - 60 mmHg   pO2, Ven 61 (H) 32 - 45 mmHg   Bicarbonate 24.9 20.0 - 28.0 mmol/L   TCO2 26 22 - 32 mmol/L   O2 Saturation 90 %   Acid-base deficit 1.0 0.0 - 2.0 mmol/L   Sodium 131 (L) 135 - 145 mmol/L   Potassium 3.8 3.5 - 5.1 mmol/L   Calcium, Ion 1.20 1.15 - 1.40 mmol/L   HCT 35.0 (  L) 36.0 - 46.0 %   Hemoglobin 11.9 (L) 12.0 - 15.0 g/dL   Sample type VENOUS   CBG monitoring, ED     Status: Abnormal   Collection Time: 07/01/22  9:47 PM  Result Value Ref Range   Glucose-Capillary 376 (H) 70 - 99 mg/dL    Comment: Glucose reference range applies only to samples taken after fasting for at least 8 hours.   DG Hand Complete Left  Result Date: 07/01/2022 CLINICAL DATA:  Pain and swelling left third finger EXAM: LEFT HAND - COMPLETE 3+ VIEW COMPARISON:  03/08/2022 FINDINGS: There is marked soft tissue swelling in the middle phalanx. There is previous almost complete amputation of distal phalanx of the middle finger. There is previous removal of distal portion of the middle phalanx and distal phalanx in the index finger. There is interval healing of radiolucent line seen in the shaft of distal  phalanx of fourth finger. No demonstrable focal lytic lesions. There are no abnormal pockets of air in the soft tissues. Degenerative changes are noted with bony spurs in first carpometacarpal joint and some of the metacarpophalangeal and interphalangeal joints. There are no opaque foreign bodies. IMPRESSION: There is interval appearance of marked soft tissue swelling in the middle finger. There is no definite demonstrable focal lytic lesion. If there is clinical suspicion for osteomyelitis, follow-up MRI should be considered. Other findings as described in the body of the report. Electronically Signed   By: Elmer Picker M.D.   On: 07/01/2022 19:22    Pending Labs Unresulted Labs (From admission, onward)     Start     Ordered   07/01/22 1831  Culture, blood (routine x 2)  BLOOD CULTURE X 2,   R      07/01/22 1831   07/01/22 1831  Lactic acid, plasma  Now then every 2 hours,   R      07/01/22 1831            Vitals/Pain Today's Vitals   07/01/22 1829 07/01/22 2030 07/01/22 2039 07/01/22 2130  BP:  (!) 147/75  120/75  Pulse:  87  99  Resp:  18  (!) 22  Temp:   99.6 F (37.6 C)   TempSrc:   Oral   SpO2:  100%  100%  Weight: 117.9 kg     Height: '5\' 5"'$  (1.651 m)     PainSc:        Isolation Precautions No active isolations  Medications Medications  ceFEPIme (MAXIPIME) 1 g in sodium chloride 0.9 % 100 mL IVPB (1 g Intravenous New Bag/Given 07/01/22 2138)  sodium chloride 0.9 % bolus 1,000 mL (0 mLs Intravenous Stopped 07/01/22 2103)    And  sodium chloride 0.9 % bolus 800 mL (0 mLs Intravenous Stopped 07/01/22 2103)  vancomycin (VANCOCIN) IVPB 1000 mg/200 mL premix (0 mg Intravenous Stopped 07/01/22 2138)  insulin aspart (novoLOG) injection 8 Units (8 Units Intravenous Given 07/01/22 2036)    Mobility walks     Focused Assessments Cardiac Assessment Handoff:    Lab Results  Component Value Date   TROPONINI 0.05 (HH) 07/13/2017   Lab Results  Component Value Date    DDIMER 1.03 (H) 07/12/2017   Does the Patient currently have chest pain? No    R Recommendations: See Admitting Provider Note  Report given to:   Additional Notes: Pt A&Ox4. Ambulatory. Continent. Self care.

## 2022-07-02 ENCOUNTER — Inpatient Hospital Stay (HOSPITAL_COMMUNITY): Payer: Commercial Managed Care - HMO | Admitting: Anesthesiology

## 2022-07-02 ENCOUNTER — Other Ambulatory Visit: Payer: Self-pay

## 2022-07-02 ENCOUNTER — Encounter (HOSPITAL_COMMUNITY)
Admission: EM | Disposition: A | Discharge status: Home / Self Care | Payer: Self-pay | Source: Home / Self Care | Attending: Internal Medicine

## 2022-07-02 ENCOUNTER — Encounter (HOSPITAL_COMMUNITY): Payer: Self-pay | Admitting: Internal Medicine

## 2022-07-02 DIAGNOSIS — M869 Osteomyelitis, unspecified: Secondary | ICD-10-CM

## 2022-07-02 DIAGNOSIS — L089 Local infection of the skin and subcutaneous tissue, unspecified: Secondary | ICD-10-CM

## 2022-07-02 DIAGNOSIS — E1152 Type 2 diabetes mellitus with diabetic peripheral angiopathy with gangrene: Secondary | ICD-10-CM | POA: Diagnosis not present

## 2022-07-02 DIAGNOSIS — Z7984 Long term (current) use of oral hypoglycemic drugs: Secondary | ICD-10-CM

## 2022-07-02 DIAGNOSIS — E1169 Type 2 diabetes mellitus with other specified complication: Secondary | ICD-10-CM | POA: Diagnosis not present

## 2022-07-02 HISTORY — PX: AMPUTATION: SHX166

## 2022-07-02 LAB — GLUCOSE, CAPILLARY
Glucose-Capillary: 296 mg/dL — ABNORMAL HIGH (ref 70–99)
Glucose-Capillary: 266 mg/dL — ABNORMAL HIGH (ref 70–99)
Glucose-Capillary: 189 mg/dL — ABNORMAL HIGH (ref 70–99)
Glucose-Capillary: 188 mg/dL — ABNORMAL HIGH (ref 70–99)
Glucose-Capillary: 214 mg/dL — ABNORMAL HIGH (ref 70–99)
Glucose-Capillary: 234 mg/dL — ABNORMAL HIGH (ref 70–99)
Glucose-Capillary: 352 mg/dL — ABNORMAL HIGH (ref 70–99)

## 2022-07-02 LAB — CBC
HCT: 31.6 % — ABNORMAL LOW (ref 36.0–46.0)
Hemoglobin: 10.6 g/dL — ABNORMAL LOW (ref 12.0–15.0)
MCH: 28.6 pg (ref 26.0–34.0)
MCHC: 33.5 g/dL (ref 30.0–36.0)
MCV: 85.4 fL (ref 80.0–100.0)
Platelets: 182 10*3/uL (ref 150–400)
RBC: 3.7 MIL/uL — ABNORMAL LOW (ref 3.87–5.11)
RDW: 14.7 % (ref 11.5–15.5)
WBC: 6.1 10*3/uL (ref 4.0–10.5)
nRBC: 0 % (ref 0.0–0.2)

## 2022-07-02 LAB — BASIC METABOLIC PANEL
Anion gap: 12 (ref 5–15)
BUN: 16 mg/dL (ref 6–20)
CO2: 22 mmol/L (ref 22–32)
Calcium: 8.6 mg/dL — ABNORMAL LOW (ref 8.9–10.3)
Chloride: 99 mmol/L (ref 98–111)
Creatinine, Ser: 0.81 mg/dL (ref 0.44–1.00)
GFR, Estimated: >60 mL/min (ref >60)
Glucose, Bld: 278 mg/dL — ABNORMAL HIGH (ref 70–99)
Potassium: 3 mmol/L — ABNORMAL LOW (ref 3.5–5.1)
Sodium: 133 mmol/L — ABNORMAL LOW (ref 135–145)

## 2022-07-02 LAB — HEMOGLOBIN A1C
Hgb A1c MFr Bld: 12.2 % — ABNORMAL HIGH (ref 4.8–5.6)
Mean Plasma Glucose: 303 mg/dL

## 2022-07-02 LAB — PHOSPHORUS: Phosphorus: 3.2 mg/dL (ref 2.5–4.6)

## 2022-07-02 LAB — MRSA NEXT GEN BY PCR, NASAL: MRSA by PCR Next Gen: NOT DETECTED

## 2022-07-02 LAB — MAGNESIUM: Magnesium: 1.7 mg/dL (ref 1.7–2.4)

## 2022-07-02 SURGERY — AMPUTATION DIGIT
Anesthesia: Monitor Anesthesia Care | Site: Middle Finger | Laterality: Left

## 2022-07-02 MED ORDER — OXYCODONE HCL 5 MG PO TABS
5.0000 mg | ORAL_TABLET | Freq: Four times a day (QID) | ORAL | Status: DC | PRN
  Administered 2022-07-02 – 2022-07-03 (×3): 5 mg via ORAL
  Filled 2022-07-02 (×3): qty 1

## 2022-07-02 MED ORDER — FENTANYL CITRATE (PF) 250 MCG/5ML IJ SOLN
INTRAMUSCULAR | Status: DC | PRN
  Administered 2022-07-02: 50 ug via INTRAVENOUS
  Administered 2022-07-02: 25 ug via INTRAVENOUS
  Administered 2022-07-02: 75 ug via INTRAVENOUS

## 2022-07-02 MED ORDER — SODIUM CHLORIDE 0.9 % IV SOLN
2.0000 g | Freq: Three times a day (TID) | INTRAVENOUS | Status: DC
  Administered 2022-07-02 – 2022-07-06 (×12): 2 g via INTRAVENOUS
  Filled 2022-07-02 (×13): qty 12.5

## 2022-07-02 MED ORDER — ENOXAPARIN SODIUM 40 MG/0.4ML IJ SOSY
40.0000 mg | PREFILLED_SYRINGE | Freq: Every day | INTRAMUSCULAR | Status: DC
  Administered 2022-07-02 – 2022-07-06 (×5): 40 mg via SUBCUTANEOUS
  Filled 2022-07-02 (×5): qty 0.4

## 2022-07-02 MED ORDER — LIDOCAINE 2% (20 MG/ML) 5 ML SYRINGE
INTRAMUSCULAR | Status: AC
  Filled 2022-07-02: qty 5

## 2022-07-02 MED ORDER — MIDAZOLAM HCL 2 MG/2ML IJ SOLN: 0.5000 mg | Freq: Once | INTRAMUSCULAR | Status: DC | PRN

## 2022-07-02 MED ORDER — INSULIN ASPART 100 UNIT/ML IJ SOLN
0.0000 [IU] | INTRAMUSCULAR | Status: DC
  Administered 2022-07-02 (×2): 11 [IU] via SUBCUTANEOUS

## 2022-07-02 MED ORDER — PANTOPRAZOLE SODIUM 40 MG PO TBEC
40.0000 mg | DELAYED_RELEASE_TABLET | Freq: Every day | ORAL | Status: DC
  Administered 2022-07-02 – 2022-07-06 (×5): 40 mg via ORAL
  Filled 2022-07-02 (×5): qty 1

## 2022-07-02 MED ORDER — INSULIN GLARGINE-YFGN 100 UNIT/ML ~~LOC~~ SOLN: 15.0000 [IU] | Freq: Every day | SUBCUTANEOUS | Status: DC

## 2022-07-02 MED ORDER — FENTANYL CITRATE (PF) 250 MCG/5ML IJ SOLN
INTRAMUSCULAR | Status: AC
  Filled 2022-07-02: qty 5

## 2022-07-02 MED ORDER — MEPERIDINE HCL 25 MG/ML IJ SOLN: 6.2500 mg | INTRAMUSCULAR | Status: DC | PRN

## 2022-07-02 MED ORDER — VANCOMYCIN HCL 750 MG/150ML IV SOLN: 750.0000 mg | INTRAVENOUS | Status: DC

## 2022-07-02 MED ORDER — HYDROMORPHONE HCL 1 MG/ML IJ SOLN: 0.2500 mg | INTRAMUSCULAR | Status: DC | PRN

## 2022-07-02 MED ORDER — ACETAMINOPHEN 500 MG PO TABS
1000.0000 mg | ORAL_TABLET | Freq: Once | ORAL | Status: AC
  Administered 2022-07-02: 1000 mg via ORAL
  Filled 2022-07-02: qty 2

## 2022-07-02 MED ORDER — 0.9 % SODIUM CHLORIDE (POUR BTL) OPTIME
TOPICAL | Status: DC | PRN
  Administered 2022-07-02: 1000 mL

## 2022-07-02 MED ORDER — DULOXETINE HCL 60 MG PO CPEP
60.0000 mg | ORAL_CAPSULE | Freq: Every day | ORAL | Status: DC
  Administered 2022-07-02 – 2022-07-03 (×2): 60 mg via ORAL
  Filled 2022-07-02 (×3): qty 1

## 2022-07-02 MED ORDER — MIDAZOLAM HCL 2 MG/2ML IJ SOLN
INTRAMUSCULAR | Status: DC | PRN
  Administered 2022-07-02: 2 mg via INTRAVENOUS

## 2022-07-02 MED ORDER — PROCHLORPERAZINE EDISYLATE 10 MG/2ML IJ SOLN: 5.0000 mg | Freq: Four times a day (QID) | INTRAMUSCULAR | Status: DC | PRN

## 2022-07-02 MED ORDER — BACITRACIN ZINC 500 UNIT/GM EX OINT
TOPICAL_OINTMENT | CUTANEOUS | Status: DC | PRN
  Administered 2022-07-02: 1 via TOPICAL

## 2022-07-02 MED ORDER — BACITRACIN ZINC 500 UNIT/GM EX OINT
TOPICAL_OINTMENT | CUTANEOUS | Status: AC
  Filled 2022-07-02: qty 28.35

## 2022-07-02 MED ORDER — OXYCODONE HCL 5 MG/5ML PO SOLN: 5.0000 mg | Freq: Once | ORAL | Status: DC | PRN

## 2022-07-02 MED ORDER — LACTATED RINGERS IV SOLN: INTRAVENOUS | Status: DC

## 2022-07-02 MED ORDER — INSULIN ASPART 100 UNIT/ML IJ SOLN
0.0000 [IU] | Freq: Every day | INTRAMUSCULAR | Status: DC
  Administered 2022-07-02: 5 [IU] via SUBCUTANEOUS
  Administered 2022-07-03: 4 [IU] via SUBCUTANEOUS
  Administered 2022-07-04: 5 [IU] via SUBCUTANEOUS
  Administered 2022-07-05: 3 [IU] via SUBCUTANEOUS

## 2022-07-02 MED ORDER — PROPOFOL 10 MG/ML IV BOLUS
INTRAVENOUS | Status: AC
  Filled 2022-07-02: qty 20

## 2022-07-02 MED ORDER — ROSUVASTATIN CALCIUM 5 MG PO TABS
10.0000 mg | ORAL_TABLET | Freq: Every day | ORAL | Status: DC
  Administered 2022-07-02 – 2022-07-05 (×4): 10 mg via ORAL
  Filled 2022-07-02 (×4): qty 2

## 2022-07-02 MED ORDER — MORPHINE SULFATE (PF) 2 MG/ML IV SOLN
2.0000 mg | Freq: Four times a day (QID) | INTRAVENOUS | Status: DC | PRN
  Administered 2022-07-02 – 2022-07-03 (×3): 2 mg via INTRAVENOUS
  Filled 2022-07-02 (×3): qty 1

## 2022-07-02 MED ORDER — OXYCODONE HCL 5 MG PO TABS: 5.0000 mg | ORAL_TABLET | Freq: Once | ORAL | Status: DC | PRN

## 2022-07-02 MED ORDER — BUPIVACAINE HCL (PF) 0.25 % IJ SOLN
INTRAMUSCULAR | Status: DC | PRN
  Administered 2022-07-02: 10 mL

## 2022-07-02 MED ORDER — CHLORHEXIDINE GLUCONATE 0.12 % MT SOLN: 15.0000 mL | Freq: Once | OROMUCOSAL | Status: AC

## 2022-07-02 MED ORDER — CHLORHEXIDINE GLUCONATE 0.12 % MT SOLN
OROMUCOSAL | Status: AC
  Administered 2022-07-02: 15 mL via OROMUCOSAL
  Filled 2022-07-02: qty 15

## 2022-07-02 MED ORDER — BUPIVACAINE HCL (PF) 0.25 % IJ SOLN
INTRAMUSCULAR | Status: AC
  Filled 2022-07-02: qty 30

## 2022-07-02 MED ORDER — ACETAMINOPHEN 500 MG PO TABS
1000.0000 mg | ORAL_TABLET | Freq: Three times a day (TID) | ORAL | Status: DC
  Administered 2022-07-02 – 2022-07-04 (×5): 1000 mg via ORAL
  Filled 2022-07-02 (×5): qty 2

## 2022-07-02 MED ORDER — POTASSIUM CHLORIDE CRYS ER 20 MEQ PO TBCR
40.0000 meq | EXTENDED_RELEASE_TABLET | Freq: Once | ORAL | Status: AC
  Administered 2022-07-02: 40 meq via ORAL
  Filled 2022-07-02: qty 2

## 2022-07-02 MED ORDER — PROMETHAZINE HCL 25 MG/ML IJ SOLN: 6.2500 mg | INTRAMUSCULAR | Status: DC | PRN

## 2022-07-02 MED ORDER — INSULIN ASPART 100 UNIT/ML IJ SOLN
0.0000 [IU] | Freq: Three times a day (TID) | INTRAMUSCULAR | Status: DC
  Administered 2022-07-02: 3 [IU] via SUBCUTANEOUS
  Administered 2022-07-03: 7 [IU] via SUBCUTANEOUS
  Administered 2022-07-03 – 2022-07-04 (×2): 5 [IU] via SUBCUTANEOUS
  Administered 2022-07-04 (×2): 7 [IU] via SUBCUTANEOUS
  Administered 2022-07-05 (×2): 3 [IU] via SUBCUTANEOUS
  Administered 2022-07-05: 2 [IU] via SUBCUTANEOUS
  Administered 2022-07-06: 5 [IU] via SUBCUTANEOUS
  Administered 2022-07-06: 4 [IU] via SUBCUTANEOUS

## 2022-07-02 MED ORDER — ACETAMINOPHEN 325 MG PO TABS: 650.0000 mg | ORAL_TABLET | Freq: Four times a day (QID) | ORAL | Status: DC | PRN

## 2022-07-02 MED ORDER — MELATONIN 5 MG PO TABS
5.0000 mg | ORAL_TABLET | Freq: Every evening | ORAL | Status: DC | PRN
  Administered 2022-07-02 – 2022-07-03 (×2): 5 mg via ORAL
  Filled 2022-07-02 (×2): qty 1

## 2022-07-02 MED ORDER — ORAL CARE MOUTH RINSE: 15.0000 mL | Freq: Once | OROMUCOSAL | Status: AC

## 2022-07-02 MED ORDER — SODIUM CHLORIDE 0.9 % IV SOLN: INTRAVENOUS | Status: DC

## 2022-07-02 MED ORDER — POLYETHYLENE GLYCOL 3350 17 G PO PACK: 17.0000 g | PACK | Freq: Every day | ORAL | Status: DC | PRN

## 2022-07-02 MED ORDER — OXYBUTYNIN CHLORIDE ER 10 MG PO TB24
10.0000 mg | ORAL_TABLET | Freq: Every day | ORAL | Status: DC
  Administered 2022-07-03 – 2022-07-06 (×4): 10 mg via ORAL
  Filled 2022-07-02 (×5): qty 1

## 2022-07-02 MED ORDER — INSULIN GLARGINE-YFGN 100 UNIT/ML ~~LOC~~ SOLN
20.0000 [IU] | Freq: Every day | SUBCUTANEOUS | Status: DC
  Administered 2022-07-02: 20 [IU] via SUBCUTANEOUS
  Filled 2022-07-02 (×2): qty 0.2

## 2022-07-02 MED ORDER — VANCOMYCIN HCL 1750 MG/350ML IV SOLN
1750.0000 mg | INTRAVENOUS | Status: DC
  Administered 2022-07-02 – 2022-07-05 (×4): 1750 mg via INTRAVENOUS
  Filled 2022-07-02 (×5): qty 350

## 2022-07-02 MED ORDER — MIDAZOLAM HCL 2 MG/2ML IJ SOLN
INTRAMUSCULAR | Status: AC
  Filled 2022-07-02: qty 2

## 2022-07-02 MED ORDER — LIDOCAINE 2% (20 MG/ML) 5 ML SYRINGE
INTRAMUSCULAR | Status: DC | PRN
  Administered 2022-07-02: 100 mg via INTRAVENOUS

## 2022-07-02 MED ORDER — GABAPENTIN 100 MG PO CAPS
200.0000 mg | ORAL_CAPSULE | Freq: Two times a day (BID) | ORAL | Status: DC
  Administered 2022-07-02 – 2022-07-06 (×9): 200 mg via ORAL
  Filled 2022-07-02 (×9): qty 2

## 2022-07-02 MED ORDER — PROPOFOL 500 MG/50ML IV EMUL
INTRAVENOUS | Status: DC | PRN
  Administered 2022-07-02: 135 ug/kg/min via INTRAVENOUS
  Administered 2022-07-02: 110 ug/kg/min via INTRAVENOUS

## 2022-07-02 SURGICAL SUPPLY — 46 items
BAG COUNTER SPONGE SURGICOUNT (BAG) ×1 IMPLANT
BNDG COHESIVE 1X5 TAN STRL LF (GAUZE/BANDAGES/DRESSINGS) ×1 IMPLANT
BNDG ELASTIC 2 VLCR STRL LF (GAUZE/BANDAGES/DRESSINGS) IMPLANT
BNDG ELASTIC 2X5.8 VLCR STR LF (GAUZE/BANDAGES/DRESSINGS) ×1 IMPLANT
BNDG ELASTIC 3X5.8 VLCR STR LF (GAUZE/BANDAGES/DRESSINGS) IMPLANT
BNDG ELASTIC 4X5.8 VLCR STR LF (GAUZE/BANDAGES/DRESSINGS) IMPLANT
BNDG GAUZE DERMACEA FLUFF 4 (GAUZE/BANDAGES/DRESSINGS) ×1 IMPLANT
CNTNR URN SCR LID CUP LEK RST (MISCELLANEOUS) IMPLANT
CONT SPEC 4OZ STRL OR WHT (MISCELLANEOUS) ×1
CORD BIPOLAR FORCEPS 12FT (ELECTRODE) ×1 IMPLANT
COVER SURGICAL LIGHT HANDLE (MISCELLANEOUS) ×1 IMPLANT
CUFF TOURN SGL QUICK 18X4 (TOURNIQUET CUFF) ×1 IMPLANT
CUFF TOURN SGL QUICK 24 (TOURNIQUET CUFF)
CUFF TRNQT CYL 24X4X16.5-23 (TOURNIQUET CUFF) IMPLANT
DRAPE SURG 17X23 STRL (DRAPES) ×1 IMPLANT
DRSG ADAPTIC 3X8 NADH LF (GAUZE/BANDAGES/DRESSINGS) ×1 IMPLANT
DRSG XEROFORM 1X8 (GAUZE/BANDAGES/DRESSINGS) IMPLANT
GAUZE SPONGE 2X2 8PLY STRL LF (GAUZE/BANDAGES/DRESSINGS) IMPLANT
GAUZE SPONGE 4X4 12PLY STRL (GAUZE/BANDAGES/DRESSINGS) IMPLANT
GAUZE STRETCH 2X75IN STRL (MISCELLANEOUS) IMPLANT
GLOVE BIO SURGEON STRL SZ7 (GLOVE) ×1 IMPLANT
GLOVE SURG UNDER POLY LF SZ7 (GLOVE) ×1 IMPLANT
GOWN STRL REUS W/ TWL LRG LVL3 (GOWN DISPOSABLE) ×2 IMPLANT
GOWN STRL REUS W/ TWL XL LVL3 (GOWN DISPOSABLE) ×1 IMPLANT
GOWN STRL REUS W/TWL LRG LVL3 (GOWN DISPOSABLE) ×2
GOWN STRL REUS W/TWL XL LVL3 (GOWN DISPOSABLE) ×1
KIT BASIN OR (CUSTOM PROCEDURE TRAY) ×1 IMPLANT
KIT TURNOVER KIT B (KITS) ×1 IMPLANT
MANIFOLD NEPTUNE II (INSTRUMENTS) ×1 IMPLANT
NDL HYPO 25GX1X1/2 BEV (NEEDLE) IMPLANT
NEEDLE HYPO 25GX1X1/2 BEV (NEEDLE) ×1 IMPLANT
NS IRRIG 1000ML POUR BTL (IV SOLUTION) ×1 IMPLANT
PACK ORTHO EXTREMITY (CUSTOM PROCEDURE TRAY) ×1 IMPLANT
PAD ARMBOARD 7.5X6 YLW CONV (MISCELLANEOUS) ×2 IMPLANT
PAD CAST 4YDX4 CTTN HI CHSV (CAST SUPPLIES) IMPLANT
PADDING CAST COTTON 4X4 STRL (CAST SUPPLIES)
SOAP 2 % CHG 4 OZ (WOUND CARE) ×1 IMPLANT
SPECIMEN JAR SMALL (MISCELLANEOUS) ×1 IMPLANT
SUT ETHILON 4 0 PS 2 18 (SUTURE) IMPLANT
SUT MERSILENE 4 0 P 3 (SUTURE) IMPLANT
SUT PROLENE 4 0 PS 2 18 (SUTURE) IMPLANT
SYR CONTROL 10ML LL (SYRINGE) IMPLANT
TOWEL GREEN STERILE (TOWEL DISPOSABLE) ×1 IMPLANT
TOWEL GREEN STERILE FF (TOWEL DISPOSABLE) ×1 IMPLANT
TUBE CONNECTING 12X1/4 (SUCTIONS) IMPLANT
WATER STERILE IRR 1000ML POUR (IV SOLUTION) ×1 IMPLANT

## 2022-07-02 NOTE — Progress Notes (Signed)
PROGRESS NOTE  [name]  DOB: Dec 08, 1965  PCP: Chesley Noon, MD [id number]  DOA: 07/01/2022  LOS: 1 day  Hospital Day: 2  Brief narrative: [name] is a 57 y.o. 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  She was sent to the ED for IV antibiotics and surgical management.    In the ED, patient was afebrile, hemodynamically stable Labs showed WBC count of 7.4, hemoglobin 11.7, sodium low at 130, glucose elevated to 575, BUN/creatinine 21/1.23, lactic acid elevated to 6.1 with creatinine improved to 3.3. EDP discussed the case with orthopedic surgery Blood culture was sent Patient was started on empiric IV antibiotics with IV cefepime, IV vancomycin Admitted to [facility].  Subjective: Patient was seen and examined this morning. Lying on bed.  Not in distress Pending OR today. Chart reviewed. Remains afebrile and hemodynamically stable Labs from this morning with potassium low at 3, sodium elevated to 133, hemoglobin 10.6  Assessment and plan: Left middle finger infection, POA. Currently on broad-spectrum IV antibiotics  Possible amputation planned today. Follow blood culture report. Recent Labs  Lab 07/01/22 1840 07/01/22 2216 07/02/22 0420  WBC 7.4  --  6.1  LATICACIDVEN 6.1* 3.3*  --    Lactic acidosis Initial lactic acid level was significantly elevated to 6.1.  Despite the presence of infection, no evidence of sepsis.  Repeat lactate level was low at 3.3 last night.  Continue IV hydration.  Repeat lactate level tomorrow.  Type 2 diabetes mellitus uncontrolled with hyperglycemia A1c 7.7 2022.  Repeat 123456 PTA  on Trulicity 1.5 mg weekly, glipizide 10 mg daily, dapagliflozin 10 mg daily, metformin 1000 mg daily.  Patient however states that Trulicity is on backorder and she has not been able to use that for the last 3 months. Currently on sliding scale insulin only..  She is agreeable to start on basal and bolus regimen. Diabetes care coordinator consulted.  Once patient is back from surgery, will start on Semglee. Recent Labs  Lab 07/01/22 2147 07/02/22 0310 07/02/22 0801 07/02/22 1039  GLUCAP 376* 296* 266* 189*   AKI  Anion gap metabolic acidosis Initial labs showed elevated creatinine, serum bicarb 20, anion gap 17 Elevated lactic acid, but serum bicarb level normal Improving labs with IV fluid.  Continue to monitor. Recent Labs    03/08/22 0957 07/01/22 1840 07/02/22 0420  BUN 18 21* 16  CREATININE 1.00 1.23* 0.81  CO2 26 20* 22    Diabetes polyneuropathy Resume home regimen. Fall precautions   Chronic anxiety/depression Continue duloxetine.   GERD Continue PPI   Hyperlipidemia Continue Crestor  Arthritis PTA on celecoxib 200 mg daily, Voltaren 75 mg twice daily,   Morbid Obesity  Body mass index is 46.13 kg/m. Patient has been advised to make an attempt to improve diet and exercise patterns to aid in weight loss.    Mobility: Patient states she is nonweightbearing status after surgery in December 2023.  Goals of care   Code Status: Full Code     DVT prophylaxis:  enoxaparin (LOVENOX) injection 40 mg Start: 07/02/22 1000 SCDs Start: 07/02/22 0446   Antimicrobials: IV antibiotics broad-spectrum Fluid: NS at 50 mill per hour Consultants:  Orthopedics Family Communication: None at bedside  Status is: Inpatient Level of care: Telemetry Surgical   Dispo: Patient is from: Home              Anticipated d/c is to: Pending clinical course Continue in-hospital care because: Pending OR today   Scheduled Meds:  [MAR Hold] DULoxetine  60 mg Oral Daily   [MAR  Hold] enoxaparin (LOVENOX) injection  40 mg Subcutaneous Daily   [MAR Hold] gabapentin  200 mg Oral BID   [MAR Hold] insulin aspart  0-20 Units Subcutaneous Q4H   [MAR Hold] pantoprazole  40 mg Oral Daily   [MAR Hold] rosuvastatin  10 mg Oral QHS    PRN meds: [MAR Hold] acetaminophen, [MAR Hold] melatonin, [MAR Hold] polyethylene glycol, [MAR Hold] prochlorperazine   Infusions:   sodium chloride 50 mL/hr at 07/02/22 0319   [MAR Hold] ceFEPime (MAXIPIME) IV     lactated ringers 10 mL/hr at 07/02/22 1102   [MAR Hold] vancomycin      Diet:  Diet Order             Diet NPO time specified Except for: Sips with Meds  Diet effective now                   Antimicrobials: Anti-infectives (From admission, onward)    Start     Dose/Rate Route Frequency Ordered Stop   07/02/22 2200  [MAR Hold]  vancomycin (VANCOREADY) IVPB 1750 mg/350 mL        (MAR Hold since Thu 07/02/2022 at 1048.Hold Reason: Transfer to a Procedural area)   1,750 mg 175 mL/hr over 120 Minutes Intravenous Every 24 hours 07/02/22 0914     07/02/22 1400  [MAR Hold]  ceFEPIme (MAXIPIME) 2 g in sodium chloride 0.9 % 100 mL IVPB        (MAR Hold since Thu 07/02/2022 at 1048.Hold Reason: Transfer to a Procedural area)   2 g 200 mL/hr over 30 Minutes Intravenous Every 8 hours 07/02/22 0913     07/02/22 0145  vancomycin (VANCOREADY) IVPB 750 mg/150 mL  Status:  Discontinued        750 mg 150 mL/hr over 60 Minutes Intravenous Every 24 hours 07/02/22 0057 07/02/22 0058   07/01/22 2215  vancomycin (VANCOCIN) IVPB 1000 mg/200 mL premix        1,000 mg 200 mL/hr over 60 Minutes Intravenous  Once 07/01/22 2213 07/02/22 0122   07/01/22 2213  vancomycin variable dose per unstable renal function (pharmacist dosing)  Status:  Discontinued         Does not apply See admin instructions 07/01/22 2213 07/02/22 0914   07/01/22 2015  vancomycin (VANCOCIN) IVPB 1000 mg/200 mL premix        1,000 mg 200 mL/hr over 60 Minutes Intravenous   Once 07/01/22 2013 07/01/22 2138   07/01/22 2015  ceFEPIme (MAXIPIME) 1 g in sodium chloride 0.9 % 100 mL IVPB  Status:  Discontinued        1 g 200 mL/hr over 30 Minutes Intravenous Every 8 hours 07/01/22 2013 07/02/22 0913       Skin assessment:       Nutritional status:  Body mass index is 46.13 kg/m.          Objective: Vitals:   07/02/22 1051 07/02/22 1105  BP: 134/77   Pulse: 96   Resp: 18 17  Temp: 98.8 F (37.1 C)   SpO2: 96%     Intake/Output Summary (Last 24  hours) at 07/02/2022 1154 Last data filed at 07/01/2022 2218 Gross per 24 hour  Intake 2034.09 ml  Output --  Net 2034.09 ml   Filed Weights   07/01/22 1829 07/01/22 2300 07/02/22 1051  Weight: 117.9 kg 121.9 kg 121.9 kg   Weight change:  Body mass index is 46.13 kg/m.   Physical Exam: General exam: Middle-aged Caucasian female.  Looks older for age Skin: No rashes, lesions or ulcers. HEENT: Atraumatic, normocephalic, no obvious bleeding Lungs: Clear to auscultation bilaterally CVS: Regular rate and rhythm, no murmur GI/Abd soft, nontender, nondistended, bowel sound present CNS: Alert, awake, oriented x 3 Psychiatry: Mood appropriate Extremities: Left leg wrapped in status with the prosthetic leg.  Multiple small digit amputations in both hands noted.  Data Review: I have personally reviewed the laboratory data and studies available.  F/u labs ordered Unresulted Labs (From admission, onward)     Start     Ordered   07/09/22 0500  Creatinine, serum  (enoxaparin (LOVENOX)    CrCl >/= 30 ml/min)  Weekly,   R     Comments: while on enoxaparin therapy    07/02/22 0446   07/03/22 [id number]  Basic metabolic panel  Tomorrow morning,   R        07/02/22 1154   07/03/22 0500  CBC with Differential/Platelet  Tomorrow morning,   R        07/02/22 1154   07/03/22 0500  Lactic acid, plasma  Tomorrow morning,   R        07/02/22 1154   07/02/22 0446  HIV Antibody (routine testing w rflx)  (HIV Antibody  (Routine testing w reflex) panel)  Once,   R        07/02/22 0446   07/02/22 0103  Hemoglobin A1c  Tomorrow morning,   R       Comments: To assess prior glycemic control    07/02/22 0102            Total time spent in review of labs and imaging, patient evaluation, formulation of plan, documentation and communication with family: 57 minutes  Signed, Terrilee Croak, MD [facility] 07/02/2022

## 2022-07-02 NOTE — Anesthesia Preprocedure Evaluation (Addendum)
Anesthesia Evaluation  Patient identified by MRN, date of birth, ID band Patient awake    Reviewed: Allergy & Precautions, NPO status , Patient's Chart, lab work & pertinent test results  History of Anesthesia Complications Negative for: history of anesthetic complications  Airway Mallampati: II  TM Distance: >3 FB Neck ROM: Full    Dental  (+) Dental Advisory Given   Pulmonary sleep apnea (no longer uses CPAP)    breath sounds clear to auscultation       Cardiovascular (-) angina + Peripheral Vascular Disease   Rhythm:Regular Rate:Normal  '19 ECHO: EF 65-70%. Wall motion was normal; there were no regional wall    motion abnormalities. Features are consistent with a pseudonormal left ventricular filling pattern, grade 2 DD    Neuro/Psych  Headaches   Depression       GI/Hepatic Neg liver ROS,GERD  Medicated and Controlled,,  Endo/Other  diabetes (glu 266), Oral Hypoglycemic Agents  BMI 46  Renal/GU Renal InsufficiencyRenal disease     Musculoskeletal  (+) Arthritis ,    Abdominal   Peds  Hematology  (+) Blood dyscrasia (Hb 10.6), anemia   Anesthesia Other Findings   Reproductive/Obstetrics                             Anesthesia Physical Anesthesia Plan  ASA: 3  Anesthesia Plan: MAC   Post-op Pain Management: Tylenol PO (pre-op)*   Induction:   PONV Risk Score and Plan: 2 and Ondansetron and Treatment may vary due to age or medical condition  Airway Management Planned: Natural Airway and Simple Face Mask  Additional Equipment: None  Intra-op Plan:   Post-operative Plan:   Informed Consent: I have reviewed the patients History and Physical, chart, labs and discussed the procedure including the risks, benefits and alternatives for the proposed anesthesia with the patient or authorized representative who has indicated his/her understanding and acceptance.     Dental advisory  given  Plan Discussed with: CRNA and Surgeon  Anesthesia Plan Comments: (Surgeon prefers MAC with local)        Anesthesia Quick Evaluation

## 2022-07-02 NOTE — Transfer of Care (Signed)
Immediate Anesthesia Transfer of Care Note  Patient: [name]  Procedure(s) Performed: AMPUTATION OF LEFT MIDDLE FINGER (Left: Middle Finger)  Patient Location: PACU  Anesthesia Type:MAC  Level of Consciousness: awake, alert , oriented, and patient cooperative  Airway & Oxygen Therapy: Patient Spontanous Breathing  Post-op Assessment: Report given to RN and Post -op Vital signs reviewed and stable  Post vital signs: Reviewed and stable  Last Vitals:  Vitals Value Taken Time  BP 130/57 07/02/22 1401  Temp    Pulse 88 07/02/22 1403  Resp 22 07/02/22 1403  SpO2 95 % 07/02/22 1403  Vitals shown include unvalidated device data.  Last Pain:  Vitals:   07/02/22 1105  TempSrc:   PainSc: 0-No pain         Complications: No notable events documented.

## 2022-07-02 NOTE — Progress Notes (Signed)
Pharmacy Antibiotic Note  [name] is a 57 y.o. female for which pharmacy has been consulted for vancomycin dosing for cellulitis.  Patient with a history of DM. 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 Patient presenting with worsening infection in her left middle finger.  Rocephin and vancomycin allergy noted in chart. Since input of those allergies in chart patient has received multiple courses of cephalosporins and vancomycin without documented issues.  Plan: Vancomycin '1750mg'$  IV q24 hours (eAUC 478, Scr 0.8, Vd 0.5) Cefepime per MD but adjust to 2g IV q8 hours for improved renal function Monitor Scr, s/sx allergic reaction, vancomycin levels as indicated F/u Ortho plans and abx LOT  Trend WBC, Fever, Renal function F/u cultures, clinical course, WBC De-escalate when able  Height: '5\' 4"'$  (162.6 cm) Weight: 121.9 kg (268 lb 11.9 oz) IBW/kg (Calculated) : 54.7  Temp (24hrs), Avg:98.9 F (37.2 C), Min:98.4 F (36.9 C), Max:99.6 F (37.6 C)  Recent Labs  Lab 07/01/22 1840 07/01/22 2216 07/02/22 0420  WBC 7.4  --  6.1  CREATININE 1.23*  --  0.81  LATICACIDVEN 6.1* 3.3*  --      Estimated Creatinine Clearance: 99.9 mL/min (by C-G formula based on SCr of 0.81 mg/dL).    Allergies  Allergen Reactions   Rocephin [Ceftriaxone] Shortness Of Breath and Swelling    Tolerated course of Rocephin 6/19 and multiple courses of Ancef/Keflex as well as Cefepime   Antimicrobials this admission:  Vancomycin 3/6 >>  Cefepime 3/6 >>   Dose adjustments this admission:   Microbiology results:  3/6 BCx: ngtd 3/7 MRSA PCR: not detected  Thank you for allowing pharmacy to be a part of this patient's care.  Dimple Nanas, PharmD, BCPS 07/02/2022 9:13 AM

## 2022-07-02 NOTE — Plan of Care (Signed)
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
## 2022-07-02 NOTE — Interval H&P Note (Signed)
History and Physical Interval Note:  07/02/2022 12:55 PM  [name]  has presented today for surgery, with the diagnosis of Left middle finger Osteomylitis.  The various methods of treatment have been discussed with the patient and family. After consideration of risks, benefits and other options for treatment, the patient has consented to  Procedure(s): AMPUTATION MIDDLE FINGER (Left) as a surgical intervention.  The patient's history has been reviewed, patient examined, no change in status, stable for surgery.  I have reviewed the patient's chart and labs.  Questions were answered to the patient's satisfaction.     Mailee Klaas Rutledge Selsor

## 2022-07-02 NOTE — Progress Notes (Signed)
Inpatient Diabetes Program Recommendations  AACE/ADA: New Consensus Statement on Inpatient Glycemic Control (2015)  Target Ranges:  Prepandial:   less than 140 mg/dL      Peak postprandial:   less than 180 mg/dL (1-2 hours)      Critically ill patients:  140 - 180 mg/dL   Lab Results  Component Value Date   GLUCAP 189 (H) 07/02/2022   HGBA1C 7.7 (H) 08/01/2020    Review of Glycemic Control  Latest Reference Range & Units 07/01/22 21:47 07/02/22 03:10 07/02/22 08:01 07/02/22 10:39  Glucose-Capillary 70 - 99 mg/dL 376 (H) 296 (H) 266 (H) 189 (H)   Diabetes history: DM  Outpatient Diabetes medications:  Glucotrol 10 mg daily, Trulicity (on back order) Current orders for Inpatient glycemic control:  Novolog 0-20 units q 4 hours  Inpatient Diabetes Program Recommendations:    Note patient is currently in surgery.  A1C is pending.  Recommend addition of basal insulin. Consider adding Semglee 20 units daily.   Unclear how long patient has not had Trulicity? Will follow up with patient when appropriate.   Thanks,  Adah Perl, RN, BC-ADM Inpatient Diabetes Coordinator Pager [phone]  (8a-5p)

## 2022-07-02 NOTE — Plan of Care (Signed)

## 2022-07-02 NOTE — H&P (View-Only) (Signed)
HAND SURGERY CONSULTATION  REQUESTING PHYSICIAN: Terrilee Croak, MD   Chief Complaint: Left middle finger infection.   HPI: [name] is a 57 y.o. 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  This started approximately 6 weeks ago with a burn to the tip of her finger.  She tried to care for the burn with daily wound care, however, the wound worsened.  It became much more swollen and erythematous yesterday morning prompting her to present to an urgent care and the ER.  She describes erythematous streaking up her arm that is much improved since starting IV abx yesterday evening.  Her pain is localized to the middle finger finger from approximately the level of the proximal phalanx to the tip.  She denies pain in the hand, wrist, or forearm.  She denies systemic symptoms.   Past Medical History:  Diagnosis Date   Arthritis    Diabetes mellitus without complication (HCC)    DOE (dyspnea on exertion) 07/12/2017   FUO (fever of unknown origin) 07/12/2017   Gastroenteritis 04/2016   Headache(784.0)    hormonal headache   Low back pain 07/12/2017   Neuropathy    bilateral feet   Obese    Pleuritic chest pain 07/12/2017   Pneumonia    hx 20+ years ago   Sleep apnea    bipap 1 yr   Ulcer of foot (HCC)    toe ulcer greater than a year ago   Past Surgical History:  Procedure Laterality Date   AMPUTATION Right 06/11/2017   Procedure: RIGHT LONG FINGER AMPUTATION DIGIT;  Surgeon: Milly Jakob, MD;  Location: [place];  Service: Orthopedics;  Laterality: Right;   AMPUTATION Right 02/22/2018   Procedure: AMPUTATION RIGHT INDEX FINGER;  Surgeon: Dayna Barker, MD;  Location: [facility];  Service: Plastics;  Laterality: Right;   ANKLE FUSION Left 08/03/2014   Procedure: Left Tibiocalcaneal Fusion;  Surgeon: Newt Minion, MD;  Location: [place];  Service: Orthopedics;  Laterality: Left;   APPLICATION OF WOUND VAC Left 08/03/2014   Procedure: APPLICATION OF WOUND VAC;  Surgeon: Newt Minion, MD;  Location: [place];  Service: Orthopedics;  Laterality: Left;   I & D EXTREMITY Left 08/01/2020   Procedure: IRRIGATION AND DEBRIDEMENT EXTREMITY,PARTIAL AMPUTATION LEFT INDEX FINGER;  Surgeon: Verner Mould, MD;  Location: [facility];  Service: Orthopedics;  Laterality: Left;   I & D EXTREMITY Left 08/06/2020   Procedure: IRRIGATION AND DEBRIDEMENT EXTREMITY;  Surgeon: Verner Mould, MD;  Location[facility];  Service: Orthopedics;  Laterality: Left;   I & D KNEE WITH POLY EXCHANGE Right 01/31/2013   Procedure: IRRIGATION AND DEBRIDEMENT KNEE WITH POLY EXCHANGE possible Antibiotic Spacer;  Surgeon: Garald Balding, MD;  Location: [place];  Service: Orthopedics;  Laterality: Right;   INCISION AND DRAINAGE Right 01/31/2013   ANTIBIOTIC SPACER  RIGHT KNEE  DR [facility]    INCISION AND DRAINAGE ABSCESS Left 08/03/2020   Procedure: INCISION AND DEBRIDEMENT LEFT INDEX FINGER;  Surgeon: Verner Mould, MD;  Location[facility];  Service: Orthopedics;  Laterality: Left;   KNEE ARTHROSCOPY     left knee   ORIF ANKLE FRACTURE Left 03/16/2014   Procedure: Foot Excision Charcot Collapse,  Internal Fixation;  Surgeon: Newt Minion, MD;  Location: [place];  Service: Orthopedics;  Laterality: Left;   REPLACEMENT TOTAL KNEE     left   TOTAL KNEE ARTHROPLASTY  06/23/2011   Procedure: TOTAL KNEE ARTHROPLASTY;  Surgeon: Garald Balding, MD;  Location: [place];  Service: Orthopedics;  Laterality: Right;  RIGHT TOTAL KNEE REPLACEMENT    Social History   Socioeconomic History   Marital status: Married    Spouse name: Not on file   Number of children: Not on file   Years of education: Not on file   Highest education level: Not on file  Occupational History   Not on file  Tobacco Use   Smoking status: Never   Smokeless  tobacco: Never  Vaping Use   Vaping Use: Never used  Substance and Sexual Activity   Alcohol use: Yes    Alcohol/week: 0.0 standard drinks of alcohol    Comment: occ   Drug use: No   Sexual activity: Not on file  Other Topics Concern   Not on file  Social History Narrative   Not on file   Social Determinants of Health   Financial Resource Strain: Not on file  Food Insecurity: No Food Insecurity (07/01/2022)   Hunger Vital Sign    Worried About Running Out of Food in the Last Year: Never true    Ran Out of Food in the Last Year: Never true  Transportation Needs: No Transportation Needs (07/01/2022)   PRAPARE - Hydrologist (Medical): No    Lack of Transportation (Non-Medical): No  Physical Activity: Not on file  Stress: Not on file  Social Connections: Not on file   Family History  Problem Relation Age of Onset   Anesthesia problems Father    Stroke Father    Hypotension Neg Hx    Malignant hyperthermia Neg Hx    Pseudochol deficiency Neg Hx    - negative except otherwise stated in the family history section Allergies  Allergen Reactions   Rocephin [Ceftriaxone] Shortness Of Breath and Swelling    Tolerated course of Rocephin 6/19 and multiple courses of Ancef/Keflex as well as Cefepime   Prior to Admission medications   Medication Sig Start Date End Date Taking? Authorizing Provider  celecoxib (CELEBREX) 200 MG capsule Take 200 mg by mouth daily. 05/20/21  Yes [provider]  diclofenac (VOLTAREN) 75 MG EC tablet Take 75 mg by mouth 2 (two) times daily.  02/28/14  Yes [provider]  Dulaglutide (TRULICITY) 1.5 0000000 SOPN Inject 1.5 mg into the skin every 7 (seven) days.   Yes [provider]  DULoxetine (CYMBALTA) 60 MG capsule Take 60 mg by mouth daily.   Yes [provider]  gabapentin (NEURONTIN) 300 MG capsule Take 300 mg by mouth 2 (two) times daily. 04/23/19  Yes [provider]  glipiZIDE  (GLUCOTROL XL) 10 MG 24 hr tablet Take 10 mg by mouth daily with breakfast.  05/23/17  Yes [provider]  glucosamine-chondroitin 500-400 MG tablet Take 2 tablets by mouth daily.    Yes [provider]  HYDROcodone-acetaminophen (NORCO) 10-325 MG tablet Take 1 tablet by mouth every 6 (six) hours as needed for moderate pain.  07/02/16  Yes [provider]  Multiple Vitamin (MULTIVITAMIN WITH MINERALS) TABS Take 1 tablet by mouth daily.   Yes [provider]  ondansetron (ZOFRAN-ODT) 8 MG disintegrating tablet Take 8 mg by mouth every 8 (eight) hours as needed for nausea/vomiting. DISSOLVE IN THE MOUTH 04/30/16  Yes [provider]  oxybutynin (DITROPAN-XL) 10 MG 24 hr tablet Take 10 mg by mouth daily.   Yes [provider]  pantoprazole (PROTONIX) 40 MG tablet Take 40 mg by mouth daily. 05/13/22  Yes [provider]  rosuvastatin (CRESTOR) 10 MG tablet Take 10 mg by mouth at bedtime.   Yes [provider]  XIGDUO XR 01-999 MG TB24 Take 1 tablet by mouth every morning.   Yes [provider]  mupirocin nasal ointment (BACTROBAN) 2 % Place 1 application into the nose 2 (two) times daily. Use one-half of tube in each nostril twice daily for five (5) days. After application, press sides of nose together and gently massage. Patient not taking: Reported on 07/01/2022 03/18/18   Campbell Riches, MD  SitaGLIPtin-MetFORMIN HCl 50-1000 MG TB24 Take 1 tablet by mouth at bedtime. Patient not taking: Reported on 07/01/2022 11/21/15   [provider]   DG Hand Complete Left  Result Date: 07/01/2022 CLINICAL DATA:  Pain and swelling left third finger EXAM: LEFT HAND - COMPLETE 3+ VIEW COMPARISON:  03/08/2022 FINDINGS: There is marked soft tissue swelling in the middle phalanx. There is previous almost complete amputation of distal phalanx of the middle finger. There is previous removal of distal portion of the middle phalanx and  distal phalanx in the index finger. There is interval healing of radiolucent line seen in the shaft of distal phalanx of fourth finger. No demonstrable focal lytic lesions. There are no abnormal pockets of air in the soft tissues. Degenerative changes are noted with bony spurs in first carpometacarpal joint and some of the metacarpophalangeal and interphalangeal joints. There are no opaque foreign bodies. IMPRESSION: There is interval appearance of marked soft tissue swelling in the middle finger. There is no definite demonstrable focal lytic lesion. If there is clinical suspicion for osteomyelitis, follow-up MRI should be considered. Other findings as described in the body of the report. Electronically Signed   By: Elmer Picker M.D.   On: 07/01/2022 19:22   - Positive ROS: All other systems have been reviewed and were otherwise negative with the exception of those mentioned in the HPI and as above.  Physical Exam: General: No acute distress, resting comfortably Cardiovascular: BUE warm and well perfused, normal rate Respiratory: Normal WOB on RA Skin: Warm and dry Neurologic: Sensation intact distally Psychiatric: Patient is at baseline mood and affect  Left Upper Extremity  Well healed amputation of index finger through middle phalanx. Necrotic wound at tip of the middle finger with scant purulent drainage.  The middle finger is erythematous from the tip of the level of the PIP joint with diffuse swelling.  She is tender to palpation both volarly and dorsally from the level of the proximal phalanx to the tip.  No tenderness to palpation in palm, wrist, or forearm.  Very mild erythematous streaking up dorsal forearm.  Sensation limited at baseline secondary to severe peripheral neuropathy.  Hand is warm and well perfused w/ BCR.    Assessment: 57 yo F w/ poorly controlled DM and severe peripheral neuropathy who presents with necrosis and infection of the right middle finger tip after a burn  6 weeks ago. There does not appear to be advanced osteomyelitis on x-ray.   Plan: - Discussed with patient that her best chance of source control is an amputation of the middle finger tip. Patient agrees. The amputation would likely be at the level of the PIP joint or middle phalanx given the appearance of her finger.   -  We reviewed the risks of surgery which include persistent infection, delayed wound healing, wound breakdown, persistent pain, finger stiffness, sensitivity at amputation tip, need for additional surgery. - Informed consent was signed. - Will send specimen for pathology and send cultures if purulent fluid encountered - Recommend continued IV abx while in house - If wound margins appear clean and healthy then will keep bandage in place until discharge after which she may start local wound care with warm, soapy water and clean, dry dressings  Sherilyn Cooter, M.D. [facility] 12:42 PM

## 2022-07-02 NOTE — Anesthesia Procedure Notes (Signed)
Procedure Name: MAC Date/Time: 07/02/2022 1:20 PM  Performed by: Lind Guest, CRNAPre-anesthesia Checklist: Patient identified, Emergency Drugs available, Suction available and Patient being monitored Patient Re-evaluated:Patient Re-evaluated prior to induction Oxygen Delivery Method: Simple face mask Preoxygenation: Pre-oxygenation with 100% oxygen Induction Type: IV induction Placement Confirmation: positive ETCO2 and CO2 detector

## 2022-07-02 NOTE — Anesthesia Postprocedure Evaluation (Signed)
Anesthesia Post Note  Patient: Enya R Ashford  Procedure(s) Performed: AMPUTATION OF LEFT MIDDLE FINGER (Left: Middle Finger)     Patient location during evaluation: PACU Anesthesia Type: MAC Level of consciousness: awake and alert, patient cooperative and oriented Pain management: pain level controlled Vital Signs Assessment: post-procedure vital signs reviewed and stable Respiratory status: spontaneous breathing, nonlabored ventilation and respiratory function stable Cardiovascular status: stable and blood pressure returned to baseline Postop Assessment: no apparent nausea or vomiting Anesthetic complications: no   No notable events documented.  Last Vitals:  Vitals:   07/02/22 1400 07/02/22 1415  BP: (!) 130/57 (!) 142/73  Pulse: 88 84  Resp: 16 20  Temp: 37.1 C 37.1 C  SpO2: 95% 97%    Last Pain:  Vitals:   07/02/22 1415  TempSrc:   PainSc: 0-No pain                 Adrin Julian,E. Briscoe Daniello

## 2022-07-03 ENCOUNTER — Encounter (HOSPITAL_COMMUNITY): Payer: Self-pay | Admitting: Orthopedic Surgery

## 2022-07-03 ENCOUNTER — Other Ambulatory Visit (HOSPITAL_COMMUNITY): Payer: Self-pay

## 2022-07-03 DIAGNOSIS — L089 Local infection of the skin and subcutaneous tissue, unspecified: Secondary | ICD-10-CM | POA: Diagnosis not present

## 2022-07-03 LAB — CBC WITH DIFFERENTIAL/PLATELET
Abs Immature Granulocytes: 0.05 10*3/uL (ref 0.00–0.07)
Basophils Absolute: 0 10*3/uL (ref 0.0–0.1)
Basophils Relative: 0 %
Eosinophils Absolute: 0.1 10*3/uL (ref 0.0–0.5)
Eosinophils Relative: 2 %
HCT: 32.3 % — ABNORMAL LOW (ref 36.0–46.0)
Hemoglobin: 10.8 g/dL — ABNORMAL LOW (ref 12.0–15.0)
Immature Granulocytes: 1 %
Lymphocytes Relative: 22 %
Lymphs Abs: 1.1 10*3/uL (ref 0.7–4.0)
MCH: 29.3 pg (ref 26.0–34.0)
MCHC: 33.4 g/dL (ref 30.0–36.0)
MCV: 87.5 fL (ref 80.0–100.0)
Monocytes Absolute: 0.5 10*3/uL (ref 0.1–1.0)
Monocytes Relative: 10 %
Neutro Abs: 3.3 10*3/uL (ref 1.7–7.7)
Neutrophils Relative %: 65 %
Platelets: 185 10*3/uL (ref 150–400)
RBC: 3.69 MIL/uL — ABNORMAL LOW (ref 3.87–5.11)
RDW: 15.1 % (ref 11.5–15.5)
WBC: 5.1 10*3/uL (ref 4.0–10.5)
nRBC: 0 % (ref 0.0–0.2)

## 2022-07-03 LAB — LACTIC ACID, PLASMA: Lactic Acid, Venous: 1.4 mmol/L (ref 0.5–1.9)

## 2022-07-03 LAB — GLUCOSE, CAPILLARY
Glucose-Capillary: 449 mg/dL — ABNORMAL HIGH (ref 70–99)
Glucose-Capillary: 314 mg/dL — ABNORMAL HIGH (ref 70–99)
Glucose-Capillary: 286 mg/dL — ABNORMAL HIGH (ref 70–99)
Glucose-Capillary: 323 mg/dL — ABNORMAL HIGH (ref 70–99)

## 2022-07-03 LAB — BASIC METABOLIC PANEL
Anion gap: 11 (ref 5–15)
BUN: 18 mg/dL (ref 6–20)
CO2: 23 mmol/L (ref 22–32)
Calcium: 8.6 mg/dL — ABNORMAL LOW (ref 8.9–10.3)
Chloride: 99 mmol/L (ref 98–111)
Creatinine, Ser: 0.97 mg/dL (ref 0.44–1.00)
GFR, Estimated: >60 mL/min (ref >60)
Glucose, Bld: 426 mg/dL — ABNORMAL HIGH (ref 70–99)
Potassium: 4.2 mmol/L (ref 3.5–5.1)
Sodium: 133 mmol/L — ABNORMAL LOW (ref 135–145)

## 2022-07-03 LAB — HIV ANTIBODY (ROUTINE TESTING W REFLEX): HIV Screen 4th Generation wRfx: NONREACTIVE

## 2022-07-03 MED ORDER — INSULIN GLARGINE-YFGN 100 UNIT/ML ~~LOC~~ SOLN
40.0000 [IU] | Freq: Every day | SUBCUTANEOUS | Status: DC
  Administered 2022-07-03: 40 [IU] via SUBCUTANEOUS
  Filled 2022-07-03 (×4): qty 0.4

## 2022-07-03 MED ORDER — INSULIN ASPART 100 UNIT/ML IJ SOLN
10.0000 [IU] | Freq: Once | INTRAMUSCULAR | Status: AC
  Administered 2022-07-03: 10 [IU] via SUBCUTANEOUS

## 2022-07-03 NOTE — Progress Notes (Signed)
PROGRESS NOTE  [name]  DOB: May 25, 1965  PCP: Chesley Noon, MD [id number]  DOA: 07/01/2022  LOS: 2 days  Hospital Day: 3  Brief narrative: [name] is a 57 y.o. 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  She was sent to the ED for IV antibiotics and surgical management.    In the ED, patient was afebrile, hemodynamically stable Labs showed WBC count of 7.4, hemoglobin 11.7, sodium low at 130, glucose elevated to 575, BUN/creatinine 21/1.23, lactic acid elevated to 6.1 with creatinine improved to 3.3. EDP discussed the case with orthopedic surgery Blood culture was sent Patient was started on empiric IV antibiotics with IV cefepime, IV vancomycin Admitted to [facility].  Subjective: Patient was seen and examined this morning. Sitting up in bed.  Not in distress. Patient has noted swelling of all of her left hand.  She is afraid that the infection is not clear and is waiting for follow-up with orthopedics today.  Assessment and plan: Left middle finger infection, POA s/p left middle finger amputation through PIP joint Currently on broad-spectrum IV antibiotics  Orthopedics follow-up patient. Continue pain management with scheduled Tylenol, oral and IV opiates as needed Follow blood culture report. Recent Labs  Lab 07/01/22 1840 07/01/22 2216 07/02/22 0420 07/03/22 0414  WBC 7.4  --  6.1 5.1  LATICACIDVEN 6.1* 3.3*  --  1.4    Lactic acidosis Initial lactic acid level was significantly elevated to 6.1.  Despite the presence of infection, no evidence of sepsis on admission.  Repeat lactate level showed a steady  downtrending to normal.  Stop IV fluid today  Type 2 diabetes mellitus uncontrolled with hyperglycemia A1c significantly elevated at 12.2 on 07/02/2022 compared to 7.7 on 2022.   PTA on Trulicity 1.5 mg weekly, glipizide 10 mg daily, dapagliflozin 10 mg daily, metformin 1000 mg daily.  Patient however states that Trulicity is on backorder and she was not been able to use that for the last 3 months. Patient was given 20 units of Semglee yesterday.  Blood sugar level elevated significantly over 400 this morning.  Increased Semglee to 40 units for this morning.  Continue sliding scale insulin. Diabetes care coordinator consulted. Recent Labs  Lab 07/02/22 1403 07/02/22 1825 07/02/22 2104 07/03/22 0812 07/03/22 1158  GLUCAP 214* 234* 352* 449* 314*    AKI  Anion gap metabolic acidosis Initial labs showed elevated creatinine, serum bicarb 20, anion gap 17 Elevated lactic acid, but serum bicarb level normal Improving labs with IV fluid.  Continue to monitor. Recent Labs    03/08/22 0957 07/01/22 1840 07/02/22 0420 07/03/22 0414  BUN 18 21* 16 18  CREATININE 1.00 1.23* 0.81 0.97  CO2 26 20* 22 23     Diabetes polyneuropathy Resume home regimen. Fall precautions   Chronic anxiety/depression Continue duloxetine.   GERD Continue PPI   Hyperlipidemia Continue Crestor  Arthritis PTA on celecoxib 200 mg daily, Voltaren 75 mg twice daily,   Morbid Obesity  Body mass index is 46.13 kg/m. Patient has been advised to make an attempt to improve diet and exercise patterns to aid in weight loss.    Mobility: Patient states she is nonweightbearing status  after surgery in December 2023.  Goals of care   Code Status: Full Code     DVT prophylaxis:  enoxaparin (LOVENOX) injection 40 mg Start: 07/02/22 1000 SCDs Start: 07/02/22 0446   Antimicrobials: IV antibiotics broad-spectrum Fluid: Can stop IV fluid Consultants: Orthopedics Family Communication: None at bedside  Status  is: Inpatient Level of care: Telemetry Surgical   Dispo: Patient is from: Home              Anticipated d/c is to: Pending clinical course Continue in-hospital care because: POD 1, patient thinks her wound is not clear yet, pending follow-up by orthopedics.   Scheduled Meds:  acetaminophen  1,000 mg Oral TID   DULoxetine  60 mg Oral Daily   enoxaparin (LOVENOX) injection  40 mg Subcutaneous Daily   gabapentin  200 mg Oral BID   insulin aspart  0-5 Units Subcutaneous QHS   insulin aspart  0-9 Units Subcutaneous TID WC   insulin glargine-yfgn  40 Units Subcutaneous Daily   oxybutynin  10 mg Oral Daily   pantoprazole  40 mg Oral Daily   rosuvastatin  10 mg Oral QHS    PRN meds: melatonin, morphine injection, oxyCODONE, polyethylene glycol, prochlorperazine   Infusions:   sodium chloride 50 mL/hr at 07/02/22 0319   ceFEPime (MAXIPIME) IV 2 g (07/03/22 0423)   vancomycin 1,750 mg (07/02/22 2115)    Diet:  Diet Order             Diet Carb Modified Fluid consistency: Thin; Room service appropriate? Yes  Diet effective now                   Antimicrobials: Anti-infectives (From admission, onward)    Start     Dose/Rate Route Frequency Ordered Stop   07/02/22 2200  vancomycin (VANCOREADY) IVPB 1750 mg/350 mL        1,750 mg 175 mL/hr over 120 Minutes Intravenous Every 24 hours 07/02/22 0914     07/02/22 1400  ceFEPIme (MAXIPIME) 2 g in sodium chloride 0.9 % 100 mL IVPB        2 g 200 mL/hr over 30 Minutes Intravenous Every 8 hours 07/02/22 0913     07/02/22 0145  vancomycin (VANCOREADY) IVPB 750 mg/150 mL  Status:  Discontinued        750 mg 150 mL/hr over 60 Minutes Intravenous Every 24 hours 07/02/22 0057 07/02/22 0058   07/01/22 2215  vancomycin (VANCOCIN) IVPB 1000 mg/200 mL premix        1,000 mg 200 mL/hr over 60 Minutes Intravenous  Once 07/01/22 2213 07/02/22 0122   07/01/22 2213  vancomycin variable dose per unstable renal function (pharmacist dosing)   Status:  Discontinued         Does not apply See admin instructions 07/01/22 2213 07/02/22 0914   07/01/22 2015  vancomycin (VANCOCIN) IVPB 1000 mg/200 mL premix        1,000 mg 200 mL/hr over 60 Minutes Intravenous  Once 07/01/22 2013 07/01/22 2138   07/01/22 2015  ceFEPIme (MAXIPIME) 1 g in sodium chloride 0.9 % 100 mL IVPB  Status:  Discontinued        1 g 200 mL/hr over 30 Minutes Intravenous Every 8 hours 07/01/22 2013 07/02/22 0913       Skin assessment:       Nutritional status:  Body mass index is 46.13 kg/m.          Objective: Vitals:   07/03/22 0425 07/03/22 [id number]  BP: 122/71 (!) 115/53  Pulse: 90 91  Resp:  19  Temp: 98.3 F (36.8 C) 98.2 F (36.8 C)  SpO2: 97% 98%    Intake/Output Summary (Last 24 hours) at 07/03/2022 1352 Last data filed at 07/03/2022 0811 Gross per 24 hour  Intake 720 ml  Output 3 ml  Net 717 ml    Filed Weights   07/01/22 1829 07/01/22 2300 07/02/22 1051  Weight: 117.9 kg 121.9 kg 121.9 kg   Weight change: 4 kg Body mass index is 46.13 kg/m.   Physical Exam: General exam: Middle-aged Caucasian female.  Looks older for age Skin: No rashes, lesions or ulcers. HEENT: Atraumatic, normocephalic, no obvious bleeding Lungs: Clear to auscultation bilaterally CVS: Regular rate and rhythm, no murmur GI/Abd soft, nontender, nondistended, bowel sound present CNS: Alert, awake, oriented x 3 Psychiatry: Mood appropriate Extremities: Left leg wrapped in status with the prosthetic leg.  Multiple small digit amputations in both hands noted.  Bandage on left hand.  Data Review: I have personally reviewed the laboratory data and studies available.  F/u labs ordered Unresulted Labs (From admission, onward)     Start     Ordered   07/09/22 0500  Creatinine, serum  (enoxaparin (LOVENOX)    CrCl >/= 30 ml/min)  Weekly,   R     Comments: while on enoxaparin therapy    07/02/22 0446            Total time spent in review of labs and  imaging, patient evaluation, formulation of plan, documentation and communication with family: 39 minutes  Signed, Terrilee Croak, MD [facility] 07/03/2022

## 2022-07-03 NOTE — TOC Initial Note (Signed)
Transition of Care [facility]) - Initial/Assessment Note    Patient Details  Name: [name] MRN: [id number] Date of Birth: Jan 17, 1966  Transition of Care [facility]) CM/SW Contact:    Verdell Carmine, RN Phone Number: 07/03/2022, 12:40 PM  Clinical Narrative:                 TOC has reviewed the patient information and found no needs. The patient will be discussed in progression rounds daily. If a need is identified, please place a TOC consult        Patient Goals and CMS Choice            Expected Discharge Plan and Services                                              Prior Living Arrangements/Services                       Activities of Daily Living Home Assistive Devices/Equipment: Environmental consultant (specify type), Wheelchair, Prosthesis, Eyeglasses, Crutches ADL Screening (condition at time of admission) Patient's cognitive ability adequate to safely complete daily activities?: Yes Is the patient deaf or have difficulty hearing?: No Does the patient have difficulty seeing, even when wearing glasses/contacts?: No Does the patient have difficulty concentrating, remembering, or making decisions?: No Patient able to express need for assistance with ADLs?: Yes Does the patient have difficulty dressing or bathing?: No Independently performs ADLs?: Yes (appropriate for developmental age) Does the patient have difficulty walking or climbing stairs?: No Weakness of Legs: Left (left leg prosthesis) Weakness of Arms/Hands: None  Permission Sought/Granted                  Emotional Assessment              Admission diagnosis:  Hyperglycemia [R73.9] Finger infection [L08.9] Sepsis, due to unspecified organism, unspecified whether acute organ dysfunction present [facility]) [A41.9] Patient Active Problem List   Diagnosis Date Noted   Finger infection 07/01/2022   Uncontrolled type 2 diabetes mellitus with autonomic neuropathy    COVID-19 virus infection  08/02/2020   Sepsis due to cellulitis ([place]) 08/01/2020   Osteomyelitis ([place]) 08/01/2020   AKI (acute kidney injury) ([place]) 05/02/2019   DM type 2, uncontrolled, with neuropathy 02/21/2018   Depression 02/21/2018   Normocytic anemia 02/21/2018   Osteomyelitis, lower leg ([place]) 10/19/2017   Left ankle pain 07/27/2017   Osteoarthritis 07/27/2017   Bacteremia due to Klebsiella pneumoniae 07/13/2017   Allergic reaction to drug 07/13/2017   Recurrent infections 07/12/2017   Uncontrolled type 2 diabetes mellitus with hyperglycemia ([place]) 07/12/2017   Idiopathic peripheral neuropathy    Osteomyelitis of finger of right hand ([place]) 06/08/2017   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, without long-term current use of insulin 06/08/2016   Controlled type 2 diabetes with neuropathy ([place]) 05/04/2016   Open wound of plantar aspect of foot 01/13/2016   Morbid (severe) obesity due to excess calories ([place]) 01/17/2015   Deformity of ankle and foot, acquired 12/06/2014   Charcot's joint of foot, left 08/03/2014   Hypersomnia 12/20/2013   Obstructive sleep apnea syndrome, severe 12/20/2013   Other fatigue 12/20/2013   Snoring 12/20/2013   Prosthetic joint infection ([place]) 01/31/2013   Postoperative anemia due to acute blood loss 06/25/2011   Osteoarthritis of knee 06/23/2011  Obesity, morbid (more than 100 lbs over ideal weight or BMI > 40) ([facility]) 06/23/2011   PCP:  Chesley Noon, MD Pharmacy:   [facility] [phone] - [place], [place] - [address] [place] [address] Phone: [phone] Fax: [phone]     Social Determinants of Health (SDOH) Social History: [place]: No Food Insecurity (07/01/2022)  Housing: Low Risk  (07/01/2022)  Transportation Needs: No Transportation Needs (07/01/2022)  Utilities: Not At Risk (07/01/2022)  Depression (PHQ2-9): Low Risk  (05/02/2019)   Tobacco Use: Low Risk  (07/03/2022)   SDOH Interventions:     Readmission Risk Interventions     No data to display

## 2022-07-03 NOTE — Progress Notes (Signed)
   Subjective:  Resting comfortably.  Pain in amputation site and finger has not improved since surgery.  Denies systemic symptoms.   Objective:   VITALS:   Vitals:   07/02/22 2111 07/03/22 0425 07/03/22 0810 07/03/22 1436  BP: 130/60 122/71 (!) 115/53 119/67  Pulse: 99 90 91 88  Resp: 18  19 19   Temp: 98.7 F (37.1 C) 98.3 F (36.8 C) 98.2 F (36.8 C) 98.8 F (37.1 C)  TempSrc: Oral Oral Oral Oral  SpO2: 97% 97% 98% 99%  Weight:      Height:        Gen: NAD, resting comfortably Pulm: Normal WOB on RA CV: Normal rate, BUE warm and well perfused L hand:  Dressing with scant sanguinous drainage, amputation stump is well approximated with nylon sutures, moderate swelling of remainder of middle finger, TTP in remaining middle finger and in palm over A1 pulleys of middle and ring finger with minimal swelling or overlying erythema, sensation at baseline, hand warm and well perfused w/ BCR    Lab Results  Component Value Date   WBC 5.1 07/03/2022   HGB 10.8 (L) 07/03/2022   HCT 32.3 (L) 07/03/2022   MCV 87.5 07/03/2022   PLT 185 07/03/2022     Assessment/Plan:  57 yo F POD 1 s/p amputation of left middle finger through PIP joint for necrotic infection of finger tip.  - Moderate and unchanged pain in remaining left middle finger with seemingly worsening pain in palm over middle finger A1 pulley.  Minimal overlying swelling or erythema. Reassuring labs without leukocytosis and resolution of her elevated lactate.  Will continue to monitor overnight.  If no improvement in pain in palm then will consider MRI of the hand to look for additional drainable fluid collection versus return to the OR for repeat irrigation and debridement of middle finger and palm. - Please make NPO at midnight in case she needs repeat I&D tomorrow    Sherilyn Cooter, MD 07/03/2022, 5:44 PM [phone]

## 2022-07-03 NOTE — Plan of Care (Signed)
  Problem: Education: Goal: Knowledge of General Education information will improve Description: Including pain rating scale, medication(s)/side effects and non-pharmacologic comfort measures Outcome: Progressing   Problem: Health Behavior/Discharge Planning: Goal: Ability to manage health-related needs will improve Outcome: Progressing   Problem: Clinical Measurements: Goal: Ability to maintain clinical measurements within normal limits will improve Outcome: Progressing Goal: Will remain free from infection Outcome: Progressing Goal: Diagnostic test results will improve Outcome: Progressing Goal: Respiratory complications will improve Outcome: Progressing Goal: Cardiovascular complication will be avoided Outcome: Progressing   Problem: Activity: Goal: Risk for activity intolerance will decrease Outcome: Progressing   Problem: Nutrition: Goal: Adequate nutrition will be maintained Outcome: Progressing   Problem: Coping: Goal: Level of anxiety will decrease Outcome: Progressing   Problem: Elimination: Goal: Will not experience complications related to bowel motility Outcome: Progressing Goal: Will not experience complications related to urinary retention Outcome: Progressing   Problem: Pain Managment: Goal: General experience of comfort will improve Outcome: Progressing   Problem: Safety: Goal: Ability to remain free from injury will improve Outcome: Progressing   Problem: Skin Integrity: Goal: Risk for impaired skin integrity will decrease Outcome: Progressing   Problem: Education: Goal: Ability to describe self-care measures that may prevent or decrease complications (Diabetes Survival Skills Education) will improve Outcome: Progressing Goal: Individualized Educational Video(s) Outcome: Progressing   Problem: Coping: Goal: Ability to adjust to condition or change in health will improve Outcome: Progressing   Problem: Fluid Volume: Goal: Ability to  maintain a balanced intake and output will improve Outcome: Progressing   Problem: Health Behavior/Discharge Planning: Goal: Ability to identify and utilize available resources and services will improve Outcome: Progressing Goal: Ability to manage health-related needs will improve Outcome: Progressing   Problem: Metabolic: Goal: Ability to maintain appropriate glucose levels will improve Outcome: Progressing   Problem: Nutritional: Goal: Maintenance of adequate nutrition will improve Outcome: Progressing

## 2022-07-03 NOTE — Inpatient Diabetes Management (Addendum)
Inpatient Diabetes Program Recommendations  AACE/ADA: New Consensus Statement on Inpatient Glycemic Control (2015)  Target Ranges:  Prepandial:   less than 140 mg/dL      Peak postprandial:   less than 180 mg/dL (1-2 hours)      Critically ill patients:  140 - 180 mg/dL   Lab Results  Component Value Date   GLUCAP 314 (H) 07/03/2022   HGBA1C 12.2 (H) 07/02/2022    Review of Glycemic Control  Diabetes history: type 2 Outpatient Diabetes medications: Trulicity 1.5 mg (not able to get since December), Glucotrol 10 mg daily Current orders for Inpatient glycemic control: Semglee 40 units daily, Novolog 0-9 units correction scale TID, Novolog 0-5 units at [facility]  Inpatient Diabetes Program Recommendations:   Spoke with patient at the bedside. Patient was diagnosed with DM about 5 years ago. States that her blood sugars have been very labile, especially with infection, had RSV in November. Patient has not been able to get Trulicity because the pharmacies have been out of it since December. Takes Glucotrol 10 mg daily. Patient states that she would be willing to take insulin if it will improve her blood sugars and help her heal. Patient was shown the insulin pen, and she states that she knows how to use since she has been on Trulicity. Discussed HgbA1C of 12.2%. states that her A1C in December was much lower.   Patient states that she does not check her blood sugars at home. Has not been able to get strips due to her insurance lack of coverge. Suggested that she can purchase a [employer] meter, strips, and lancets over the counter. Did a benefit check on insurance coverage for insulin: per Lyndel Safe CPhT, Basalgar is a $25 copay, Humalog and Novolog are not on formulary for her insurance. Patient's PCP is not covered by her insurance, as well.   Will continue to monitor blood sugars while in the hospital.  Harvel Ricks RN BSN CDE Diabetes Coordinator Pager: [phone]  8am-5pm

## 2022-07-04 ENCOUNTER — Inpatient Hospital Stay (HOSPITAL_COMMUNITY): Payer: Commercial Managed Care - HMO

## 2022-07-04 DIAGNOSIS — L089 Local infection of the skin and subcutaneous tissue, unspecified: Secondary | ICD-10-CM | POA: Diagnosis not present

## 2022-07-04 DIAGNOSIS — M7989 Other specified soft tissue disorders: Secondary | ICD-10-CM | POA: Diagnosis not present

## 2022-07-04 LAB — GLUCOSE, CAPILLARY
Glucose-Capillary: 337 mg/dL — ABNORMAL HIGH (ref 70–99)
Glucose-Capillary: 273 mg/dL — ABNORMAL HIGH (ref 70–99)
Glucose-Capillary: 333 mg/dL — ABNORMAL HIGH (ref 70–99)
Glucose-Capillary: 434 mg/dL — ABNORMAL HIGH (ref 70–99)

## 2022-07-04 LAB — GLUCOSE, RANDOM: Glucose, Bld: 407 mg/dL — ABNORMAL HIGH (ref 70–99)

## 2022-07-04 MED ORDER — CELECOXIB 200 MG PO CAPS
200.0000 mg | ORAL_CAPSULE | Freq: Every day | ORAL | Status: DC
  Administered 2022-07-04 – 2022-07-06 (×3): 200 mg via ORAL
  Filled 2022-07-04 (×3): qty 1

## 2022-07-04 MED ORDER — INSULIN GLARGINE-YFGN 100 UNIT/ML ~~LOC~~ SOLN
50.0000 [IU] | Freq: Every day | SUBCUTANEOUS | Status: DC
  Administered 2022-07-04 – 2022-07-06 (×3): 50 [IU] via SUBCUTANEOUS
  Filled 2022-07-04 (×3): qty 0.5

## 2022-07-04 MED ORDER — HYDROCODONE-ACETAMINOPHEN 10-325 MG PO TABS
1.0000 | ORAL_TABLET | Freq: Four times a day (QID) | ORAL | Status: DC | PRN
  Administered 2022-07-04 – 2022-07-06 (×4): 1 via ORAL
  Filled 2022-07-04 (×4): qty 1

## 2022-07-04 MED ORDER — ACETAMINOPHEN 500 MG PO TABS
1000.0000 mg | ORAL_TABLET | Freq: Two times a day (BID) | ORAL | Status: DC
  Administered 2022-07-05 – 2022-07-06 (×3): 1000 mg via ORAL
  Filled 2022-07-04 (×3): qty 2

## 2022-07-04 MED ORDER — INSULIN ASPART 100 UNIT/ML IJ SOLN
1.0000 [IU] | Freq: Once | INTRAMUSCULAR | Status: AC
  Administered 2022-07-04: 1 [IU] via SUBCUTANEOUS

## 2022-07-04 MED ORDER — GLIPIZIDE ER 10 MG PO TB24
10.0000 mg | ORAL_TABLET | Freq: Every day | ORAL | Status: DC
  Administered 2022-07-05 – 2022-07-06 (×2): 10 mg via ORAL
  Filled 2022-07-04 (×2): qty 1

## 2022-07-04 MED ORDER — CHLORHEXIDINE GLUCONATE 4 % EX LIQD
Freq: Two times a day (BID) | CUTANEOUS | Status: DC
  Filled 2022-07-04: qty 15
  Filled 2022-07-04: qty 30
  Filled 2022-07-04: qty 45
  Filled 2022-07-04: qty 15
  Filled 2022-07-04: qty 45

## 2022-07-04 NOTE — Progress Notes (Signed)
   Subjective:  Patient seen and examined this morning.   She is resting comfortably.  No acute events overnight.  Pain in the hand and middle finger is unchanged if not a little improved.  She denies any systemic symptoms.  Objective:   VITALS:   Vitals:   07/03/22 1436 07/03/22 2057 07/04/22 0420 07/04/22 0833  BP: 119/67 (!) 111/54 113/69 122/74  Pulse: 88 87 79 84  Resp: 19 18 18 15   Temp: 98.8 F (37.1 C) 98.7 F (37.1 C) 98.4 F (36.9 C) 98.1 F (36.7 C)  TempSrc: Oral Oral Oral Oral  SpO2: 99% 97% 100% 92%  Weight:      Height:        Gen: NAD, resting comfortably in bed Pulm: Normal WOB on RA CV: Normal rate, BUE warm and well perfused Left Hand: Unchanged appearance of middle finger with slightly improved swelling, mild erythema of remaining middle finger from incision to level of palmar digital crease, no expressible purulence from incision, still with mild to moderate tenderness to palpation in the palm at the level of the A1 pulleys of the ring and middle fingers, no erythema in palm and minimal swelling compared to contralateral hand, hand remains warm and well perfused    Lab Results  Component Value Date   WBC 5.1 07/03/2022   HGB 10.8 (L) 07/03/2022   HCT 32.3 (L) 07/03/2022   MCV 87.5 07/03/2022   PLT 185 07/03/2022     Assessment/Plan:  57 yo F w/ poorly controlled DM2 with severe peripheral neuropathy now POD 2 s/p amputation of middle finger through PIP joint for significant finger tip infection with necrosis.  Purulence extended from finger tip to level of PIP joint.   - Exam is unchanged today.  No expressible purulence in middle finger with erythema from incision line to palmar digital crease.  No erythema or swelling in the palm.    - Interestingly, after talking with patient and reviewing her chart, she had a similar postoperative course following amputation of the index finger tip in 2022.  She underwent irrigation and debridement of the left  index finger with amputation through the DIP joint.  She returned to the OR two more times for repeat I&D for either incomplete source control or worsening infection.  She does note that there was frank purulence from the amputation site following that initial surgery which has not been the case during this hospital course.  - Patient and I discussed continued observation versus MRI or returning to the OR.  Given her stable exam and lack of purulence or drainage from the amputation site, we have decided to continue to monitor things today and overnight.  I will see her again first thing in the morning with potential for return to the OR for repeat I&D of the amputation site with irrigation and debridement of the palm over the A1 pulley and irrigation of the tendon sheath.  Patient is in agreement with this plan.  - We will start wound care today with soaks of the hand in warm, diluted Hibiclens solution and application of clean, dry dressings.  - Please keep NPO at midnight tonight   Sherilyn Cooter, MD 07/04/2022, 8:54 AM [phone]

## 2022-07-04 NOTE — Progress Notes (Signed)
Right lower extremity venous duplex has been completed. Preliminary results can be found in CV Proc through chart review.   07/04/22 3:12 PM [name] RVT

## 2022-07-04 NOTE — Progress Notes (Signed)
PROGRESS NOTE  [name]  DOB: December 23, 1965  PCP: Chesley Noon, MD YI:[id number]  DOA: 07/01/2022  LOS: 3 days  Hospital Day: 4  Brief narrative: [name] is a 57 y.o. 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  She was sent to the ED for IV antibiotics and surgical management.    In the ED, patient was afebrile, hemodynamically stable Labs showed WBC count of 7.4, hemoglobin 11.7, sodium low at 130, glucose elevated to 575, BUN/creatinine 21/1.23, lactic acid elevated to 6.1 with creatinine improved to 3.3. EDP discussed the case with orthopedic surgery Blood culture was sent Patient was started on empiric IV antibiotics with IV cefepime, IV vancomycin Admitted to [facility].  Subjective: Patient was seen and examined this morning. Sitting up in recliner.  Not in distress.  Seen by orthopedics.  May need repeat surgery tomorrow. Blood sugar level gradually improving. Right leg noted to be swollen.  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 Orthopedic follow-up appreciated.  May need repeat OR tomorrow Continue pain management with scheduled Tylenol, oral and IV opiates as needed Follow blood culture report. Lactic acid normalized. Recent Labs  Lab 07/01/22 1840 07/01/22 2216 07/02/22 0420 07/03/22 0414  WBC 7.4  --  6.1 5.1  LATICACIDVEN 6.1* 3.3*  --  1.4    Type 2 diabetes mellitus uncontrolled with hyperglycemia A1c  significantly elevated at 12.2 on 07/02/2022 compared to 7.7 on 2022.   PTA on Trulicity 1.5 mg weekly, glipizide 10 mg daily, dapagliflozin 10 mg daily, metformin 1000 mg daily.  Patient however states that Trulicity is on backorder and she was not been able to use that for the last 3 months. Patient was given 40 units of Semglee yesterday.  Blood sugar level is gradually improving but he still remains over 300.  Increase Semglee to 50 units for this morning.  Also resume glipizide.  Continue sliding scale insulin. Diabetes care coordinator consulted. Recent Labs  Lab 07/03/22 1158 07/03/22 1700 07/03/22 2010 07/04/22 0834 07/04/22 1113  GLUCAP 314* 286* 323* 337* 273*    AKI  Anion gap metabolic acidosis Initial labs showed elevated creatinine, serum bicarb 20, anion gap 17 Elevated lactic acid, but serum bicarb level normal Improving labs with IV fluid.  Continue to monitor. Recent Labs    03/08/22 0957 07/01/22 1840 07/02/22 0420 07/03/22 0414  BUN 18 21* 16 18  CREATININE 1.00 1.23* 0.81 0.97  CO2 26 20* 22 23     Diabetes polyneuropathy Resume home regimen. Fall precautions   Chronic anxiety/depression Continue duloxetine.   GERD Continue PPI   Hyperlipidemia Continue Crestor  Arthritis PTA on celecoxib 200 mg daily, Voltaren 75 mg twice daily,  Right lower extremity swelling Obtain DVT scan   Morbid Obesity  Body mass index is 46.13 kg/m. Patient has been advised to make an attempt to improve diet and exercise patterns to aid in weight loss.    Mobility: Patient states she is nonweightbearing status after surgery in December 2023.  Goals  of care   Code Status: Full Code     DVT prophylaxis:  enoxaparin (LOVENOX) injection 40 mg Start: 07/02/22 1000 SCDs Start: 07/02/22 0446   Antimicrobials: IV antibiotics broad-spectrum Fluid: Not on IV fluid Consultants: Orthopedics Family Communication: None at bedside  Status is: Inpatient Level of care:  Telemetry Surgical   Dispo: Patient is from: Home              Anticipated d/c is to: Pending clinical course Continue in-hospital care because: May need repeat surgery tomorrow.  Scheduled Meds:  acetaminophen  1,000 mg Oral BID   celecoxib  200 mg Oral Daily   chlorhexidine   Topical BID   enoxaparin (LOVENOX) injection  40 mg Subcutaneous Daily   gabapentin  200 mg Oral BID   [START ON 07/05/2022] glipiZIDE  10 mg Oral Q breakfast   insulin aspart  0-5 Units Subcutaneous QHS   insulin aspart  0-9 Units Subcutaneous TID WC   insulin glargine-yfgn  50 Units Subcutaneous Daily   oxybutynin  10 mg Oral Daily   pantoprazole  40 mg Oral Daily   rosuvastatin  10 mg Oral QHS    PRN meds: HYDROcodone-acetaminophen, melatonin, morphine injection, polyethylene glycol, prochlorperazine   Infusions:   ceFEPime (MAXIPIME) IV 2 g (07/04/22 0440)   vancomycin 1,750 mg (07/03/22 2056)    Diet:  Diet Order             Diet NPO time specified Except for: Sips with Meds  Diet effective midnight           Diet Carb Modified Fluid consistency: Thin; Room service appropriate? Yes  Diet effective now                   Antimicrobials: Anti-infectives (From admission, onward)    Start     Dose/Rate Route Frequency Ordered Stop   07/02/22 2200  vancomycin (VANCOREADY) IVPB 1750 mg/350 mL        1,750 mg 175 mL/hr over 120 Minutes Intravenous Every 24 hours 07/02/22 0914     07/02/22 1400  ceFEPIme (MAXIPIME) 2 g in sodium chloride 0.9 % 100 mL IVPB        2 g 200 mL/hr over 30 Minutes Intravenous Every 8 hours 07/02/22 0913     07/02/22 0145  vancomycin (VANCOREADY) IVPB 750 mg/150 mL  Status:  Discontinued        750 mg 150 mL/hr over 60 Minutes Intravenous Every 24 hours 07/02/22 0057 07/02/22 0058   07/01/22 2215  vancomycin (VANCOCIN) IVPB 1000 mg/200 mL premix        1,000 mg 200 mL/hr over 60 Minutes Intravenous  Once 07/01/22 2213 07/02/22 0122   07/01/22 2213  vancomycin  variable dose per unstable renal function (pharmacist dosing)  Status:  Discontinued         Does not apply See admin instructions 07/01/22 2213 07/02/22 0914   07/01/22 2015  vancomycin (VANCOCIN) IVPB 1000 mg/200 mL premix        1,000 mg 200 mL/hr over 60 Minutes Intravenous  Once 07/01/22 2013 07/01/22 2138   07/01/22 2015  ceFEPIme (MAXIPIME) 1 g in sodium chloride 0.9 % 100 mL IVPB  Status:  Discontinued        1 g 200 mL/hr over 30 Minutes Intravenous Every 8 hours 07/01/22 2013 07/02/22 0913       Skin assessment:       Nutritional status:  Body mass index is 46.13 kg/m.  Objective: Vitals:   07/04/22 0420 07/04/22 0833  BP: 113/69 122/74  Pulse: 79 84  Resp: 18 15  Temp: 98.4 F (36.9 C) 98.1 F (36.7 C)  SpO2: 100% 92%   No intake or output data in the 24 hours ending 07/04/22 1359  Filed Weights   07/01/22 1829 07/01/22 2300 07/02/22 1051  Weight: 117.9 kg 121.9 kg 121.9 kg   Weight change:  Body mass index is 46.13 kg/m.   Physical Exam: General exam: Middle-aged Caucasian female.  Looks older for age. Skin: No rashes, lesions or ulcers. HEENT: Atraumatic, normocephalic, no obvious bleeding Lungs: Clear to auscultation bilaterally CVS: Regular rate and rhythm, no murmur GI/Abd soft, nontender, nondistended, bowel sound present CNS: Alert, awake, oriented x 3 Psychiatry: Mood appropriate Extremities: Right leg 1+ edema noted.  Left leg with the prosthetic leg.  Multiple small digit amputations in both hands noted.  Bandage on left hand.  Data Review: I have personally reviewed the laboratory data and studies available.  F/u labs ordered Unresulted Labs (From admission, onward)     Start     Ordered   07/09/22 0500  Creatinine, serum  (enoxaparin (LOVENOX)    CrCl >/= 30 ml/min)  Weekly,   R     Comments: while on enoxaparin therapy    07/02/22 0446            Total time spent in review of labs and imaging, patient  evaluation, formulation of plan, documentation and communication with family: 65 minutes  Signed, Terrilee Croak, MD [facility] 07/04/2022

## 2022-07-04 NOTE — Inpatient Diabetes Management (Signed)
Inpatient Diabetes Program Recommendations  AACE/ADA: New Consensus Statement on Inpatient Glycemic Control (2015)  Target Ranges:  Prepandial:   less than 140 mg/dL      Peak postprandial:   less than 180 mg/dL (1-2 hours)      Critically ill patients:  140 - 180 mg/dL   Lab Results  Component Value Date   GLUCAP 273 (H) 07/04/2022   HGBA1C 12.2 (H) 07/02/2022    Review of Glycemic Control  CBGs above goal.  449, 314, 286  Current orders for Inpatient glycemic control: Semglee 50 QD, Novolog 0-9 units TID with meals and 0-5 HS  Inpatient Diabetes Program Recommendations:    Consider adding meal coverage insulin - Novolog 4 units TID with meals if eating > 50%  Follow.  Thank you. Lorenda Peck, RD, LDN, [facility] Inpatient Diabetes Coordinator [phone]

## 2022-07-05 DIAGNOSIS — L089 Local infection of the skin and subcutaneous tissue, unspecified: Secondary | ICD-10-CM | POA: Diagnosis not present

## 2022-07-05 LAB — GLUCOSE, CAPILLARY
Glucose-Capillary: 210 mg/dL — ABNORMAL HIGH (ref 70–99)
Glucose-Capillary: 215 mg/dL — ABNORMAL HIGH (ref 70–99)
Glucose-Capillary: 228 mg/dL — ABNORMAL HIGH (ref 70–99)
Glucose-Capillary: 233 mg/dL — ABNORMAL HIGH (ref 70–99)
Glucose-Capillary: 234 mg/dL — ABNORMAL HIGH (ref 70–99)
Glucose-Capillary: 293 mg/dL — ABNORMAL HIGH (ref 70–99)

## 2022-07-05 MED ORDER — INSULIN ASPART 100 UNIT/ML IJ SOLN
4.0000 [IU] | Freq: Three times a day (TID) | INTRAMUSCULAR | Status: DC
  Administered 2022-07-05 – 2022-07-06 (×5): 4 [IU] via SUBCUTANEOUS

## 2022-07-05 NOTE — Progress Notes (Signed)
PROGRESS NOTE  [name]  DOB: 17-Dec-1965  PCP: Chesley Noon, MD YI:[id number]  DOA: 07/01/2022  LOS: 4 days  Hospital Day: 5  Brief narrative: [name] is a 57 y.o. 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  She was sent to the ED for IV antibiotics and surgical management.    In the ED, patient was afebrile, hemodynamically stable Labs showed WBC count of 7.4, hemoglobin 11.7, sodium low at 130, glucose elevated to 575, BUN/creatinine 21/1.23, lactic acid elevated to 6.1 with creatinine improved to 3.3. EDP discussed the case with orthopedic surgery Blood culture was sent Patient was started on empiric IV antibiotics with IV cefepime, IV vancomycin Admitted to [facility].  Subjective: Patient was seen and examined this morning. Sitting up in recliner.  Not in distress.  Surgical site was examined by orthopedic doctor this morning and was told that she would not need another surgery.  Patient feels better. 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 Orthopedic follow-up appreciated.  May need repeat OR tomorrow Continue pain management with scheduled Tylenol, oral and IV opiates as needed No growth in blood culture. Lactic acid normalized. Recent Labs  Lab 07/01/22 1840 07/01/22 2216 07/02/22 0420 07/03/22 0414  WBC 7.4  --  6.1 5.1  LATICACIDVEN 6.1* 3.3*  --  1.4    Type 2 diabetes mellitus  uncontrolled with hyperglycemia A1c significantly elevated at 12.2 on 07/02/2022 compared to 7.7 on 2022.   PTA on Trulicity 1.5 mg weekly, glipizide 10 mg daily, dapagliflozin 10 mg daily, metformin 1000 mg daily.  Patient however states that Trulicity is on backorder and she was not been able to use that for the last 3 months. Patient was given 50 units of Semglee yesterday.  Blood sugar level is gradually improving but he still remains over 200. Added Premeal scheduled aspart 4 units 3 times daily.  Continue Semglee to 50 units for this morning.  Continue  glipizide.  Continue sliding scale insulin. Diabetes care coordinator consult appreciated. Recent Labs  Lab 07/04/22 1950 07/05/22 0048 07/05/22 0340 07/05/22 0743 07/05/22 1110  GLUCAP 434* 228* 215* 210* 234*    AKI  Anion gap metabolic acidosis Initial labs showed elevated creatinine, serum bicarb 20, anion gap 17 Elevated lactic acid, but serum bicarb level normal Improved labs with IV fluid.  Continue to monitor. Recent Labs    03/08/22 0957 07/01/22 1840 07/02/22 0420 07/03/22 0414  BUN 18 21* 16 18  CREATININE 1.00 1.23* 0.81 0.97  CO2 26 20* 22 23     Diabetes polyneuropathy Resume home regimen. Fall precautions   Chronic anxiety/depression Continue duloxetine.   GERD Continue PPI   Hyperlipidemia Continue Crestor  Arthritis PTA on celecoxib 200 mg daily, Voltaren 75 mg twice daily,  Right lower extremity swelling Ultrasound duplex lower extremity negative for DVT.   Morbid Obesity  Body mass index is 46.13 kg/m. Patient has been advised to make an attempt to improve diet and exercise patterns to  aid in weight loss.    Mobility: Patient states she is nonweightbearing status after surgery in December 2023.  Goals of care   Code Status: Full Code     DVT prophylaxis:  enoxaparin (LOVENOX) injection 40 mg Start: 07/02/22 1000 SCDs Start: 07/02/22 0446   Antimicrobials: IV antibiotics  broad-spectrum Fluid: Not on IV fluid Consultants: Orthopedics Family Communication: None at bedside  Status is: Inpatient Level of care: Telemetry Surgical   Dispo: Patient is from: Home              Anticipated d/c is to: Pending clinical course Continue in-hospital care because: Hopefully home tomorrow  Scheduled Meds:  acetaminophen  1,000 mg Oral BID   celecoxib  200 mg Oral Daily   chlorhexidine   Topical BID   enoxaparin (LOVENOX) injection  40 mg Subcutaneous Daily   gabapentin  200 mg Oral BID   glipiZIDE  10 mg Oral Q breakfast   insulin aspart  0-5 Units Subcutaneous QHS   insulin aspart  0-9 Units Subcutaneous TID WC   insulin aspart  4 Units Subcutaneous TID WC   insulin glargine-yfgn  50 Units Subcutaneous Daily   oxybutynin  10 mg Oral Daily   pantoprazole  40 mg Oral Daily   rosuvastatin  10 mg Oral QHS    PRN meds: HYDROcodone-acetaminophen, melatonin, morphine injection, polyethylene glycol, prochlorperazine   Infusions:   ceFEPime (MAXIPIME) IV 2 g (07/05/22 0600)   vancomycin 1,750 mg (07/04/22 2213)    Diet:  Diet Order             Diet Carb Modified Fluid consistency: Thin; Room service appropriate? Yes  Diet effective now                   Antimicrobials: Anti-infectives (From admission, onward)    Start     Dose/Rate Route Frequency Ordered Stop   07/02/22 2200  vancomycin (VANCOREADY) IVPB 1750 mg/350 mL        1,750 mg 175 mL/hr over 120 Minutes Intravenous Every 24 hours 07/02/22 0914     07/02/22 1400  ceFEPIme (MAXIPIME) 2 g in sodium chloride 0.9 % 100 mL IVPB        2 g 200 mL/hr over 30 Minutes Intravenous Every 8 hours 07/02/22 0913     07/02/22 0145  vancomycin (VANCOREADY) IVPB 750 mg/150 mL  Status:  Discontinued        750 mg 150 mL/hr over 60 Minutes Intravenous Every 24 hours 07/02/22 0057 07/02/22 0058   07/01/22 2215  vancomycin (VANCOCIN) IVPB 1000 mg/200 mL premix        1,000 mg 200 mL/hr over 60 Minutes  Intravenous  Once 07/01/22 2213 07/02/22 0122   07/01/22 2213  vancomycin variable dose per unstable renal function (pharmacist dosing)  Status:  Discontinued         Does not apply See admin instructions 07/01/22 2213 07/02/22 0914   07/01/22 2015  vancomycin (VANCOCIN) IVPB 1000 mg/200 mL premix        1,000 mg 200 mL/hr over 60 Minutes Intravenous  Once 07/01/22 2013 07/01/22 2138   07/01/22 2015  ceFEPIme (MAXIPIME) 1 g in sodium chloride 0.9 % 100 mL IVPB  Status:  Discontinued        1 g 200 mL/hr over 30 Minutes Intravenous Every 8 hours 07/01/22 2013 07/02/22 0913       Skin assessment:       Nutritional status:  Body mass index is  46.13 kg/m.          Objective: Vitals:   07/05/22 0342 07/05/22 0744  BP: (!) 106/54 (!) 110/49  Pulse: 75 82  Resp: 18 18  Temp: 98.5 F (36.9 C) 98.6 F (37 C)  SpO2: 100% 96%    Intake/Output Summary (Last 24 hours) at 07/05/2022 1136 Last data filed at 07/05/2022 0549 Gross per 24 hour  Intake 800 ml  Output --  Net 800 ml    Filed Weights   07/01/22 1829 07/01/22 2300 07/02/22 1051  Weight: 117.9 kg 121.9 kg 121.9 kg   Weight change:  Body mass index is 46.13 kg/m.   Physical Exam: General exam: Middle-aged Caucasian female.  Looks older for age. Skin: No rashes, lesions or ulcers. HEENT: Atraumatic, normocephalic, no obvious bleeding Lungs: Clear to auscultation bilaterally CVS: Regular rate and rhythm, no murmur GI/Abd soft, nontender, nondistended, bowel sound present CNS: Alert, awake, oriented x 3 Psychiatry: Mood appropriate Extremities: Right leg 1+ edema noted.  Left leg with the prosthetic leg.  Multiple small digit amputations in both hands noted.  Bandage on left hand.  Data Review: I have personally reviewed the laboratory data and studies available.  F/u labs ordered Unresulted Labs (From admission, onward)     Start     Ordered   07/09/22 0500  Creatinine, serum  (enoxaparin (LOVENOX)    CrCl  >/= 30 ml/min)  Weekly,   R     Comments: while on enoxaparin therapy    07/02/22 0446   07/06/22 [id number]  Basic metabolic panel  Tomorrow morning,   R       Question:  Specimen collection method  Answer:  Lab=Lab collect   07/05/22 0826   07/06/22 0500  CBC with Differential/Platelet  Tomorrow morning,   R       Question:  Specimen collection method  Answer:  Lab=Lab collect   07/05/22 0826            Total time spent in review of labs and imaging, patient evaluation, formulation of plan, documentation and communication with family: 37 minutes  Signed, Terrilee Croak, MD [facility] 07/05/2022

## 2022-07-05 NOTE — Progress Notes (Signed)
   Subjective:  Patient seen and examined this morning.  She is resting comfortably in bed.  Her pain is significantly improved compared to yesterday.  She denies pain in her palm or ring finger.  She feels much better than yesterday.  Objective:   VITALS:   Vitals:   07/04/22 1616 07/04/22 1954 07/05/22 0342 07/05/22 0744  BP: 101/61 (!) 115/53 (!) 106/54 (!) 110/49  Pulse: 73 79 75 82  Resp: 17 18 18 18   Temp: 98.8 F (37.1 C) 98.4 F (36.9 C) 98.5 F (36.9 C) 98.6 F (37 C)  TempSrc: Oral  Oral Oral  SpO2: 98% 100% 100% 96%  Weight:      Height:        Gen: NAD, resting comfortably Pulm: Normal WOB on RA CV: Normal rate L hand: Dressing with scant sanguinous strikethrough, swelling of middle finger remains unchanged but erythema is much improved, no drainage from amputation incision, no erythema or swelling in palm with no TTP around A1 pulley at ring and middle fingers which is a significant improvement from yesterday, hand remains warm and well perfused    Lab Results  Component Value Date   WBC 5.1 07/03/2022   HGB 10.8 (L) 07/03/2022   HCT 32.3 (L) 07/03/2022   MCV 87.5 07/03/2022   PLT 185 07/03/2022     Assessment/Plan:  57 yo F w/ poorly controlled DM2 with severe peripheral neuropathy now POD 3 s/p amputation of middle finger through PIP joint for significant finger tip infection with necrosis. Purulence extended from finger tip to level of PIP joint.   - Pain in hand/palm has significant improved since yesterday.  Patient noticed the improvement about mid day yesterday.  - No plan for repeat surgery at this point given her improvement.  Patient may have diet today.  - Will continue BID wound care as ordered - Continue IV abx while in house - Will reassess in AM   Sherilyn Cooter, MD 07/05/2022, 9:45 AM ([phone]) [phone]

## 2022-07-05 NOTE — Progress Notes (Signed)
Pharmacy Antibiotic Note  [name] is a 57 y.o. female for which pharmacy has been consulted for vancomycin dosing for cellulitis.  Patient with a history of DM. 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 Patient presenting with worsening infection in her left middle finger.  Patient s/p OR 3/7.  Initially monitoring for possible return to OR but with clinical improvement, no further plans for surgery at this time.  Last BMET 3/8.  Plan: Continue Vancomycin '1750mg'$  IV q24 hours (eAUC 478, Scr 0.8, Vd 0.5) Cefepime 2g IV q8 hours  Will order BMET for 3/11 to monitor SCr Will defer Vancomycin levels at this time.  Hopeful for continued improvement and d/c abx >> swith to PO abx soon.    Height: '5\' 4"'$  (162.6 cm) Weight: 121.9 kg (268 lb 11.9 oz) IBW/kg (Calculated) : 54.7  Temp (24hrs), Avg:98.6 F (37 C), Min:98.4 F (36.9 C), Max:98.8 F (37.1 C)  Recent Labs  Lab 07/01/22 1840 07/01/22 2216 07/02/22 0420 07/03/22 0414  WBC 7.4  --  6.1 5.1  CREATININE 1.23*  --  0.81 0.97  LATICACIDVEN 6.1* 3.3*  --  1.4     Estimated Creatinine Clearance: 83.4 mL/min (by C-G formula based on SCr of 0.97 mg/dL).    Allergies  Allergen Reactions   Rocephin [Ceftriaxone] Shortness Of Breath and Swelling    Tolerated course of Rocephin 6/19 and multiple courses of Ancef/Keflex as well as Cefepime   Antimicrobials this admission:  Vancomycin 3/6 >>  Cefepime 3/6 >>   Dose adjustments this admission:   Microbiology results:  3/6 BCx: ngtd 3/7 MRSA PCR: not detected  Thank you for allowing pharmacy to be a part of this patient's care.  [employer], Pharm.D., BCPS Clinical Pharmacist  **Pharmacist phone directory can be found on [email or web address] listed under [place].  07/05/2022 10:39 AM

## 2022-07-06 ENCOUNTER — Encounter: Payer: Self-pay | Admitting: Orthopedic Surgery

## 2022-07-06 DIAGNOSIS — L089 Local infection of the skin and subcutaneous tissue, unspecified: Secondary | ICD-10-CM | POA: Diagnosis not present

## 2022-07-06 LAB — CBC WITH DIFFERENTIAL/PLATELET
Abs Immature Granulocytes: 0.11 10*3/uL — ABNORMAL HIGH (ref 0.00–0.07)
Basophils Absolute: 0 10*3/uL (ref 0.0–0.1)
Basophils Relative: 0 %
Eosinophils Absolute: 0.2 10*3/uL (ref 0.0–0.5)
Eosinophils Relative: 4 %
HCT: 32.1 % — ABNORMAL LOW (ref 36.0–46.0)
Hemoglobin: 10.5 g/dL — ABNORMAL LOW (ref 12.0–15.0)
Immature Granulocytes: 2 %
Lymphocytes Relative: 29 %
Lymphs Abs: 1.4 10*3/uL (ref 0.7–4.0)
MCH: 28.8 pg (ref 26.0–34.0)
MCHC: 32.7 g/dL (ref 30.0–36.0)
MCV: 88.2 fL (ref 80.0–100.0)
Monocytes Absolute: 0.4 10*3/uL (ref 0.1–1.0)
Monocytes Relative: 9 %
Neutro Abs: 2.8 10*3/uL (ref 1.7–7.7)
Neutrophils Relative %: 56 %
Platelets: 205 10*3/uL (ref 150–400)
RBC: 3.64 MIL/uL — ABNORMAL LOW (ref 3.87–5.11)
RDW: 14.6 % (ref 11.5–15.5)
WBC: 5 10*3/uL (ref 4.0–10.5)
nRBC: 0 % (ref 0.0–0.2)

## 2022-07-06 LAB — CULTURE, BLOOD (ROUTINE X 2)
Culture: NO GROWTH
Culture: NO GROWTH
Special Requests: ADEQUATE
Special Requests: ADEQUATE

## 2022-07-06 LAB — BASIC METABOLIC PANEL
Anion gap: 12 (ref 5–15)
BUN: 17 mg/dL (ref 6–20)
CO2: 21 mmol/L — ABNORMAL LOW (ref 22–32)
Calcium: 9.2 mg/dL (ref 8.9–10.3)
Chloride: 102 mmol/L (ref 98–111)
Creatinine, Ser: 0.97 mg/dL (ref 0.44–1.00)
GFR, Estimated: >60 mL/min (ref >60)
Glucose, Bld: 248 mg/dL — ABNORMAL HIGH (ref 70–99)
Potassium: 3.8 mmol/L (ref 3.5–5.1)
Sodium: 135 mmol/L (ref 135–145)

## 2022-07-06 LAB — GLUCOSE, CAPILLARY
Glucose-Capillary: 238 mg/dL — ABNORMAL HIGH (ref 70–99)
Glucose-Capillary: 249 mg/dL — ABNORMAL HIGH (ref 70–99)
Glucose-Capillary: 287 mg/dL — ABNORMAL HIGH (ref 70–99)
Glucose-Capillary: 291 mg/dL — ABNORMAL HIGH (ref 70–99)

## 2022-07-06 LAB — SURGICAL PATHOLOGY

## 2022-07-06 MED ORDER — AMOXICILLIN-POT CLAVULANATE 875-125 MG PO TABS
1.0000 | ORAL_TABLET | Freq: Two times a day (BID) | ORAL | Status: DC
  Administered 2022-07-06: 1 via ORAL
  Filled 2022-07-06: qty 1

## 2022-07-06 MED ORDER — PEN NEEDLES 31G X 5 MM MISC: 1.0000 | Freq: Three times a day (TID) | 0 refills | Status: AC

## 2022-07-06 MED ORDER — HUMALOG 100 UNIT/ML IJ SOLN: 0.0000 [IU] | Freq: Three times a day (TID) | INTRAMUSCULAR | 0 refills | Status: DC

## 2022-07-06 MED ORDER — BLOOD GLUCOSE MONITORING SUPPL DEVI: 1.0000 | Freq: Three times a day (TID) | 0 refills | Status: AC

## 2022-07-06 MED ORDER — HYDROCODONE-ACETAMINOPHEN 10-325 MG PO TABS: 1.0000 | ORAL_TABLET | Freq: Four times a day (QID) | ORAL | 0 refills | Status: AC | PRN

## 2022-07-06 MED ORDER — BLOOD GLUCOSE TEST VI STRP: 1.0000 | ORAL_STRIP | Freq: Three times a day (TID) | 0 refills | Status: AC

## 2022-07-06 MED ORDER — SACCHAROMYCES BOULARDII 250 MG PO CAPS: 250.0000 mg | ORAL_CAPSULE | Freq: Two times a day (BID) | ORAL | 0 refills | Status: AC

## 2022-07-06 MED ORDER — LANCETS MISC: 1.0000 | Freq: Three times a day (TID) | 0 refills | Status: AC

## 2022-07-06 MED ORDER — INSULIN GLARGINE-YFGN 100 UNIT/ML ~~LOC~~ SOLN: 60.0000 [IU] | Freq: Every day | SUBCUTANEOUS | Status: DC

## 2022-07-06 MED ORDER — LANCET DEVICE MISC: 1.0000 | Freq: Three times a day (TID) | 0 refills | Status: AC

## 2022-07-06 MED ORDER — BASAGLAR KWIKPEN 100 UNIT/ML ~~LOC~~ SOPN: 50.0000 [IU] | PEN_INJECTOR | Freq: Every day | SUBCUTANEOUS | 2 refills | Status: DC

## 2022-07-06 MED ORDER — DOXYCYCLINE HYCLATE 100 MG PO TABS
100.0000 mg | ORAL_TABLET | Freq: Two times a day (BID) | ORAL | Status: DC
  Administered 2022-07-06: 100 mg via ORAL
  Filled 2022-07-06: qty 1

## 2022-07-06 MED ORDER — AMOXICILLIN-POT CLAVULANATE 875-125 MG PO TABS: 1.0000 | ORAL_TABLET | Freq: Two times a day (BID) | ORAL | 0 refills | Status: AC

## 2022-07-06 MED ORDER — DOXYCYCLINE HYCLATE 100 MG PO TABS: 100.0000 mg | ORAL_TABLET | Freq: Two times a day (BID) | ORAL | 0 refills | Status: AC

## 2022-07-06 MED ORDER — INSULIN LISPRO 100 UNIT/ML IJ SOLN: 0.0000 [IU] | Freq: Three times a day (TID) | INTRAMUSCULAR | 0 refills | Status: DC

## 2022-07-06 NOTE — Progress Notes (Signed)
Initial Nutrition Assessment  DOCUMENTATION CODES:   Morbid obesity  INTERVENTION:  Provide print outs for diet education on adequate nutrition Discuss higher protein needs since surgery  Educated patient on protein supplementation    NUTRITION DIAGNOSIS:   Predicted suboptimal nutrient intake related to decreased appetite as evidenced by per patient/family report.   GOAL:   Patient will meet greater than or equal to 90% of their needs   MONITOR:   PO intake  REASON FOR ASSESSMENT:   Consult Assessment of nutrition requirement/status  ASSESSMENT:   POD39 57 y.o. female with PMHx severe polyneuropathy, DM2, s/p left leg amputation and several digit amputations was admitted to hospital for left middle finger amputation surgery due to infection  Labs: Glu 248 Meds: cefepime, novolog, semglee, protonix, vancomycin Wt hx: stable wt since 2019  Po intake: 100% po intake x 1 documented meal   Visited patient who was sitting in the chair. She reports poor appetite. Patient reports skipping breakfast, light lunch and a complete dinner with no snacking in between. Patient states she has weighed the same since being a teenager. She reports her health has been compromised since last November and has battled many infections and surgeries. Patient has also been nonweightbearing status prior to surgery due to recent reconstructive surgery on R leg for Charcot's arthropathy. She states her A1c was down to 6 prior to her health decline and now it is 12. Patient is adamant about reducing her A1c and takes her blood glucose levels seriously. Discharge plans in place   NUTRITION - FOCUSED PHYSICAL EXAM:  Flowsheet Row Most Recent Value  Orbital Region No depletion  Upper Arm Region No depletion  Thoracic and Lumbar Region No depletion  Buccal Region No depletion  Temple Region No depletion  Clavicle Bone Region No depletion  Clavicle and Acromion Bone Region No depletion  Scapular  Bone Region Unable to assess  Dorsal Hand No depletion  Patellar Region Unable to assess  Anterior Thigh Region Unable to assess  Posterior Calf Region Unable to assess  Edema (RD Assessment) Unable to assess  Hair Reviewed  Eyes Reviewed  Mouth Reviewed  Skin Reviewed  Nails Reviewed       Diet Order:   Diet Order             Diet Carb Modified           Diet Carb Modified Fluid consistency: Thin; Room service appropriate? Yes  Diet effective now                   EDUCATION NEEDS:   Education needs have been addressed  Skin:  Skin Assessment: Reviewed RN Assessment  Last BM:  3/11  Height:   Ht Readings from Last 1 Encounters:  07/02/22 '5\' 4"'$  (1.626 m)    Weight:   Wt Readings from Last 1 Encounters:  07/02/22 121.9 kg     BMI:  Body mass index is 46.13 kg/m.  Estimated Nutritional Needs:   Kcal:  1600-1900 kcal/day  Protein:  65-82 g protein/day  Fluid:  >/= 2L    Trey Paula, RDN, LDN  Clinical Nutrition

## 2022-07-06 NOTE — Progress Notes (Signed)
Pt resting comfortably.  No pain in the finger or hand today.   Incision is clean and dry.  No drainage.  Minimal erythema.  No TTP in middle finger or palm.  Full and painless AROM of adjacent fingers.   57 yo F w/ poorly controlled DM2 with severe peripheral neuropathy now POD 4 s/p amputation of middle finger through PIP joint for significant finger tip infection with necrosis.  - No plans for repeat surgery.  Patient is okay for discharge as far as her hand is concerned. - She will perform wound care at home twice per day and apply clean, dry bandages. - Recommend broad spectrum oral abx at discharge - Will see back in the office in 1 week  Sherilyn Cooter, M.D. [facility]

## 2022-07-06 NOTE — Discharge Summary (Addendum)
Physician Discharge Summary  [name] [id number] DOB: 12-17-1965 DOA: 07/01/2022  PCP: Chesley Noon, MD  Admit date: 07/01/2022 Discharge date: 07/06/2022  Admitted From: Home Discharge disposition: Home  Recommendations at discharge:  10 more days of oral Augmentin and oral doxycycline with probiotics You been started on Lantus and sliding scale insulin.  Continue to monitor blood sugar level at home Follow-up with orthopedics as an outpatient Judicious use of pain medicines.  Brief narrative: [name] is a 57 y.o. 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  She was sent to the ED for IV antibiotics and surgical management.    In the ED, patient was afebrile, hemodynamically stable Labs showed WBC count of 7.4, hemoglobin 11.7, sodium low at 130, glucose elevated to 575, BUN/creatinine 21/1.23, lactic acid elevated to 6.1 with creatinine improved to 3.3. EDP discussed the case with orthopedic surgery Blood culture was sent Patient was started on empiric IV antibiotics with IV cefepime, IV vancomycin Admitted to [facility]. 3/7, underwent left middle finger amputation  Subjective: Patient was seen and examined this morning. Sitting up in recliner.  Not in distress.  Surgical site was examined by orthopedic doctor this morning and was told that she would not need another surgery.  Patient feels better. 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  Wound  healing. No growth in culture Will discharge her on oral Augmentin and doxycycline for next 10 days with probiotics Follow-up with orthopedic as an outpatient. Continue as needed pain meds. Recent Labs  Lab 07/01/22 1840 07/01/22 2216 07/02/22 0420 07/03/22 0414 07/06/22 0321  WBC 7.4  --  6.1 5.1 5.0  LATICACIDVEN 6.1* 3.3*  --  1.4  --    Type 2 diabetes mellitus uncontrolled with hyperglycemia A1c significantly elevated at 12.2 on 07/02/2022 compared to 7.7 on 2022.   PTA on Trulicity 1.5 mg weekly, glipizide 10 mg daily, dapagliflozin 10 mg daily, metformin 1000 mg daily.  Patient however states that Trulicity is on backorder and she was not been able to use that for the last 3 months. In the hospital, patient was treated with long-acting insulin and sliding scale insulin.  Dose adjusted based on response. Diabetes care coordinator consult appreciated. Discharge on Basaglar Solostar 50 units daily, Humalog sliding scale insulin.  Continue oral glipizide, dapagliflozin and metformin as before. Continue monitor blood sugar at home. Follow-up with PCP/endocrinologist as an outpatient. Recent Labs  Lab 07/05/22 1956 07/06/22 0014 07/06/22 0401 07/06/22 0816 07/06/22 1144  GLUCAP 293* 238* 249* 291* 287*   AKI  Anion gap metabolic acidosis Initial labs showed elevated creatinine, serum bicarb 20, anion gap 17 Elevated lactic acid, but serum bicarb level normal Improved labs with IV fluid.  Continue to monitor. Recent Labs    03/08/22 0957 07/01/22 1840 07/02/22 0420 07/03/22 0414 07/06/22 0321  BUN 18 21* '16 18 17  '$ CREATININE 1.00 1.23* 0.81 0.97 0.97  CO2 26 20* 22 23 21*    Chronic polyneuropathy Patient states he has chronic polyneuropathy of unknown etiology.  She has had  numerous infections in her hands and legs.  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  The progression of injuries secondary to polyneuropathy. Continue Neurontin. Fall  precautions   Chronic anxiety/depression Continue duloxetine.   GERD Continue PPI   Hyperlipidemia Continue Crestor  Arthritis PTA on celecoxib 200 mg daily, Voltaren 75 mg twice daily,  Right lower extremity swelling Ultrasound duplex lower extremity negative for DVT.   Morbid Obesity  Body mass index is 46.13 kg/m. Patient has been advised to make an attempt to improve diet and exercise patterns to aid in weight loss.    Mobility: Patient states she is nonweightbearing status after surgery in December 2023.  Goals of care   Code Status: Full Code   Wounds:  - Wound / Incision (Open or Dehisced) 06/10/17 Other (Comment) Finger (Comment which one) Right right middle finger (Active)  Date First Assessed/Time First Assessed: 06/10/17 0856   Wound Type: Other (Comment)  Location: Finger (Comment which one)  Location Orientation: Right  Wound Description (Comments): right middle finger  Present on Admission: Yes    Assessments 06/10/2017  8:53 AM 02/23/2018  7:59 AM  Dressing Type Gauze (Comment) Gauze (Comment);Other (Comment)  Dressing Status Clean;Dry Clean;Intact;Dry  Dressing Change Frequency -- PRN  Site / Wound Assessment -- Dressing in place / Unable to assess  Drainage Amount -- Minimal  Drainage Description -- Purulent     No associated orders.     Incision (Closed) 02/22/18 Finger (Comment which one) Right (Active)  Date First Assessed/Time First Assessed: 02/22/18 1244   Location: Finger (Comment which one)  Location Orientation: Right    Assessments 02/22/2018 12:52 PM 02/23/2018  7:59 AM  Dressing Type -- Gauze (Comment);Adhesive bandage  Dressing Clean;Dry;Intact Clean;Dry;Intact  Site / Wound Assessment Dressing in place / Unable to assess Dressing in place / Unable to assess  Drainage Amount None None     No associated orders.     Incision (Closed) 08/06/20 Hand Left (Active)  Date First Assessed/Time First Assessed: 08/06/20 1856   Location: Hand   Location Orientation: Left    Assessments 08/06/2020  7:34 PM 08/08/2020  8:00 AM  Dressing Type Compression wrap Gauze (Comment);Adhesive bandage  Dressing Clean;Dry;Intact Changed  Site / Wound Assessment Dressing in place / Unable to assess Dressing in place / Unable to assess  Drainage Amount None --  Treatment Ice applied --     No associated orders.     Wound / Incision (Open or Dehisced) 08/07/20 Incision - Open Finger (Comment which one) Left;Anterior;Posterior;Distal PT HYDROTHERAPY ONLY incision at distal finger extending proximally into palm (Active)  Date First Assessed/Time First Assessed: 08/07/20 1300   Wound Type: Incision - Open  Location: (c) Finger (Comment which one)  Location Orientation: Left;Anterior;Posterior;Distal  Wound Description (Comments): PT HYDROTHERAPY ONLY incision at distal...    Assessments 08/07/2020  1:00 PM 08/09/2020 12:00 PM  Wound Image        Dressing Type Impregnated gauze (bismuth);Compression wrap;Gauze (Comment) Silver hydrofiber;Impregnated gauze (bismuth);Gauze (Comment);Compression wrap  Dressing Changed Changed Changed  Dressing Status Old drainage;Dry;Intact;Clean Old drainage  Dressing Change Frequency PRN PRN  Site / Wound Assessment Clean;Granulation tissue;Pink;Red Clean;Pale;Red;Pink  % Wound base Red or Granulating 85% 85%  % Wound base Other/Granulation Tissue (Comment) 15% 15%  Peri-wound Assessment Intact;Edema;Maceration;Pink Maceration;Pink;Intact  Margins Unattached edges (unapproximated) Unattached edges (unapproximated)  Closure Sutures Sutures  Drainage Amount Minimal Moderate  Drainage Description Serosanguineous;Sanguineous Serosanguineous  Treatment Cleansed;Debridement (Selective);Hydrotherapy (Pulse lavage);Packing (Impregnated  strip);Tape changed Debridement (Selective);Cleansed;Hydrotherapy (Pulse lavage);Tape changed     No associated orders.     Incision (Closed) 07/02/22 Finger (Comment which one) Left (Active)   Date First Assessed/Time First Assessed: 07/02/22 1350   Location: Finger (Comment which one)  Location Orientation: Left    Assessments 07/02/2022  2:00 PM 07/06/2022  7:46 AM  Dressing Type Compression wrap Gauze (Comment)  Dressing Clean, Dry, Intact Clean, Dry, Intact  Dressing Change Frequency -- Twice a day  Site / Wound Assessment Dressing in place / Unable to assess Dressing in place / Unable to assess;Clean;Dry  Closure -- Sutures  Drainage Amount None None     No associated orders.    Discharge Exam:   Vitals:   07/05/22 0744 07/05/22 1430 07/05/22 2112 07/06/22 0813  BP: (!) 110/49 111/61 (!) 115/58 120/64  Pulse: 82 99 62 81  Resp: '18 17  18  '$ Temp: 98.6 F (37 C) 98.4 F (36.9 C) 97.8 F (36.6 C) 98.5 F (36.9 C)  TempSrc: Oral Oral Oral   SpO2: 96% 99% 98% 99%  Weight:      Height:        Body mass index is 46.13 kg/m.   General exam: Middle-aged Caucasian female.  Looks older for age. Skin: No rashes, lesions or ulcers. HEENT: Atraumatic, normocephalic, no obvious bleeding Lungs: Clear to auscultation bilaterally CVS: Regular rate and rhythm, no murmur GI/Abd soft, nontender, nondistended, bowel sound present CNS: Alert, awake, oriented x 3 Psychiatry: Mood appropriate Extremities: Right leg 1+ edema noted.  Left leg with the prosthetic leg.  Multiple small digit amputations in both hands noted.  Bandage on left hand.  Follow ups:    Follow-up Information     Chesley Noon, MD Follow up.   Specialty: Family Medicine Contact information: [place] [address] [phone]                 Discharge Instructions:   Discharge Instructions     Call MD for:  difficulty breathing, headache or visual disturbances   Complete by: As directed    Call MD for:  extreme fatigue   Complete by: As directed    Call MD for:  hives   Complete by: As directed    Call MD for:  persistant dizziness or light-headedness    Complete by: As directed    Call MD for:  persistant nausea and vomiting   Complete by: As directed    Call MD for:  severe uncontrolled pain   Complete by: As directed    Call MD for:  temperature >100.4   Complete by: As directed    Diet Carb Modified   Complete by: As directed    Discharge instructions   Complete by: As directed    Recommendations at discharge:   10 more days of oral Augmentin and oral doxycycline with probiotics  You been started on Lantus and sliding scale insulin.  Continue to monitor blood sugar level at home  Follow-up with orthopedics as an outpatient  Judicious use of pain medicines.  Discharge instructions for diabetes mellitus: Check blood sugar 3 times a day and bedtime at home. If blood sugar running above 200 or less than 70 please call your MD to adjust insulin. If you notice signs and symptoms of hypoglycemia (low blood sugar) like jitteriness, confusion, thirst, tremor and sweating, please check blood sugar, drink sugary drink/biscuits/sweets to increase sugar level and call MD or return to ER.  General discharge instructions: Follow with Primary MD Chesley Noon, MD in 7 days  Please request your PCP  to go over your hospital tests, procedures, radiology results at the follow up. Please get your medicines reviewed and adjusted.  Your PCP may decide to repeat certain labs or tests as needed. Do not drive, operate heavy machinery, perform activities at heights, swimming or participation in water activities or provide baby sitting services if your were admitted for syncope or siezures until you have seen by Primary MD or a Neurologist and advised to do so again. [place] Controlled Substance Reporting System database was reviewed. Do not drive, operate heavy machinery, perform activities at heights, swim, participate in water activities or provide baby-sitting services while on medications for pain, sleep and mood until your outpatient  physician has reevaluated you and advised to do so again.  You are strongly recommended to comply with the dose, frequency and duration of prescribed medications. Activity: As tolerated with Full fall precautions use walker/cane & assistance as needed Avoid using any recreational substances like cigarette, tobacco, alcohol, or non-prescribed drug. If you experience worsening of your admission symptoms, develop shortness of breath, life threatening emergency, suicidal or homicidal thoughts you must seek medical attention immediately by calling 911 or calling your MD immediately  if symptoms less severe. You must read complete instructions/literature along with all the possible adverse reactions/side effects for all the medicines you take and that have been prescribed to you. Take any new medicine only after you have completely understood and accepted all the possible adverse reactions/side effects.  Wear Seat belts while driving. You were cared for by a hospitalist during your hospital stay. If you have any questions about your discharge medications or the care you received while you were in the hospital after you are discharged, you can call the unit and ask to speak with the hospitalist or the covering physician. Once you are discharged, your primary care physician will handle any further medical issues. Please note that NO REFILLS for any discharge medications will be authorized once you are discharged, as it is imperative that you return to your primary care physician (or establish a relationship with a primary care physician if you do not have one).   Discharge wound care:   Complete by: As directed    Increase activity slowly   Complete by: As directed        Discharge Medications:   Allergies as of 07/06/2022       Reactions   Rocephin [ceftriaxone] Shortness Of Breath, Swelling   Tolerated course of Rocephin 6/19 and multiple courses of Ancef/Keflex as well as Cefepime         Medication List     STOP taking these medications    DULoxetine 60 MG capsule Commonly known as: CYMBALTA   mupirocin nasal ointment 2 % Commonly known as: BACTROBAN   ondansetron 8 MG disintegrating tablet Commonly known as: ZOFRAN-ODT   SitaGLIPtin-MetFORMIN HCl 123XX123 MG [id number]   Trulicity 1.5 0000000 Sopn Generic drug: Dulaglutide       TAKE these medications    amoxicillin-clavulanate 875-125 MG tablet Commonly known as: AUGMENTIN Take 1 tablet by mouth every 12 (twelve) hours for 10 days.   Basaglar KwikPen 100 UNIT/ML Inject 50 Units into the skin daily. May substitute as needed per insurance.   Blood Glucose Monitoring Suppl Devi 1 each by Does not apply route 3 (three) times daily. May dispense any manufacturer covered by patient's insurance.  BLOOD GLUCOSE TEST STRIPS Strp 1 each by Does not apply route 3 (three) times daily. Use as directed to check blood sugar. May dispense any manufacturer covered by patient's insurance and fits patient's device.   celecoxib 200 MG capsule Commonly known as: CELEBREX Take 200 mg by mouth daily.   diclofenac 75 MG EC tablet Commonly known as: VOLTAREN Take 75 mg by mouth 2 (two) times daily.   doxycycline 100 MG tablet Commonly known as: VIBRA-TABS Take 1 tablet (100 mg total) by mouth every 12 (twelve) hours for 10 days.   gabapentin 300 MG capsule Commonly known as: NEURONTIN Take 300 mg by mouth 2 (two) times daily.   glipiZIDE 10 MG 24 hr tablet Commonly known as: GLUCOTROL XL Take 10 mg by mouth daily with breakfast.   glucosamine-chondroitin 500-400 MG tablet Take 2 tablets by mouth daily.   HYDROcodone-acetaminophen 10-325 MG tablet Commonly known as: NORCO Take 1 tablet by mouth every 6 (six) hours as needed for up to 5 days for moderate pain.   insulin lispro 100 UNIT/ML injection Commonly known as: HumaLOG Inject 0-0.09 mLs (0-9 Units total) into the skin 3 (three) times daily with meals. CBG  < 70: Implement Hypoglycemia Standing Orders and refer to Hypoglycemia Standing Orders sidebar report CBG 70 - 120: 0 units, CBG 121 - 150: 1 unit, CBG 151 - 200: 2 units, CBG 201 - 250: 3 units, CBG 251 - 300: 5 units, CBG 301 - 350: 7 units, CBG 351 - 400: 9 units   Lancet Device Misc 1 each by Does not apply route 3 (three) times daily. May dispense any manufacturer covered by patient's insurance.   Lancets Misc 1 each by Does not apply route 3 (three) times daily. Use as directed to check blood sugar. May dispense any manufacturer covered by patient's insurance and fits patient's device.   multivitamin with minerals Tabs tablet Take 1 tablet by mouth daily.   oxybutynin 10 MG 24 hr tablet Commonly known as: DITROPAN-XL Take 10 mg by mouth daily.   pantoprazole 40 MG tablet Commonly known as: PROTONIX Take 40 mg by mouth daily.   Pen Needles 31G X 5 MM Misc 1 each by Does not apply route 3 (three) times daily. May dispense any manufacturer covered by patient's insurance.   rosuvastatin 10 MG tablet Commonly known as: CRESTOR Take 10 mg by mouth at bedtime.   saccharomyces boulardii 250 MG capsule Commonly known as: FLORASTOR Take 1 capsule (250 mg total) by mouth 2 (two) times daily for 10 days.   Xigduo XR 01-999 MG Tb24 Generic drug: Dapagliflozin Pro-metFORMIN ER Take 1 tablet by mouth every morning.               Discharge Care Instructions  (From admission, onward)           Start     Ordered   07/06/22 0000  Discharge wound care:        07/06/22 1257             The results of significant diagnostics from this hospitalization (including imaging, microbiology, ancillary and laboratory) are listed below for reference.    Procedures and Diagnostic Studies:   DG Hand Complete Left  Result Date: 07/01/2022 CLINICAL DATA:  Pain and swelling left third finger EXAM: LEFT HAND - COMPLETE 3+ VIEW COMPARISON:  03/08/2022 FINDINGS: There is marked soft  tissue swelling in the middle phalanx. There is previous almost complete amputation of distal phalanx of the  middle finger. There is previous removal of distal portion of the middle phalanx and distal phalanx in the index finger. There is interval healing of radiolucent line seen in the shaft of distal phalanx of fourth finger. No demonstrable focal lytic lesions. There are no abnormal pockets of air in the soft tissues. Degenerative changes are noted with bony spurs in first carpometacarpal joint and some of the metacarpophalangeal and interphalangeal joints. There are no opaque foreign bodies. IMPRESSION: There is interval appearance of marked soft tissue swelling in the middle finger. There is no definite demonstrable focal lytic lesion. If there is clinical suspicion for osteomyelitis, follow-up MRI should be considered. Other findings as described in the body of the report. Electronically Signed   By: Elmer Picker M.D.   On: 07/01/2022 19:22     Labs:   Basic Metabolic Panel: Recent Labs  Lab 07/01/22 1840 07/01/22 2056 07/02/22 0420 07/03/22 0414 07/04/22 2117 07/06/22 0321  NA 130* 131* 133* 133*  --  135  K 3.7 3.8 3.0* 4.2  --  3.8  CL 93*  --  99 99  --  102  CO2 20*  --  22 23  --  21*  GLUCOSE 575*  --  278* 426* 407* 248*  BUN 21*  --  16 18  --  17  CREATININE 1.23*  --  0.81 0.97  --  0.97  CALCIUM 9.0  --  8.6* 8.6*  --  9.2  MG  --   --  1.7  --   --   --   PHOS  --   --  3.2  --   --   --    GFR Estimated Creatinine Clearance: 83.4 mL/min (by C-G formula based on SCr of 0.97 mg/dL). Liver Function Tests: Recent Labs  Lab 07/01/22 1840  AST 30  ALT 24  ALKPHOS 139*  BILITOT 0.7  PROT 6.8  ALBUMIN 3.0*   No results for input(s): "LIPASE", "AMYLASE" in the last 168 hours. No results for input(s): "AMMONIA" in the last 168 hours. Coagulation profile No results for input(s): "INR", "PROTIME" in the last 168 hours.  CBC: Recent Labs  Lab 07/01/22 1840  07/01/22 2056 07/02/22 0420 07/03/22 0414 07/06/22 0321  WBC 7.4  --  6.1 5.1 5.0  NEUTROABS 5.2  --   --  3.3 2.8  HGB 11.7* 11.9* 10.6* 10.8* 10.5*  HCT 36.8 35.0* 31.6* 32.3* 32.1*  MCV 89.5  --  85.4 87.5 88.2  PLT 205  --  182 185 205   Cardiac Enzymes: No results for input(s): "CKTOTAL", "CKMB", "CKMBINDEX", "TROPONINI" in the last 168 hours. BNP: Invalid input(s): "POCBNP" CBG: Recent Labs  Lab 07/05/22 1956 07/06/22 0014 07/06/22 0401 07/06/22 0816 07/06/22 1144  GLUCAP 293* 238* 249* 291* 287*   D-Dimer No results for input(s): "DDIMER" in the last 72 hours. Hgb A1c No results for input(s): "HGBA1C" in the last 72 hours. Lipid Profile No results for input(s): "CHOL", "HDL", "LDLCALC", "TRIG", "CHOLHDL", "LDLDIRECT" in the last 72 hours. Thyroid function studies No results for input(s): "TSH", "T4TOTAL", "T3FREE", "THYROIDAB" in the last 72 hours.  Invalid input(s): "FREET3" Anemia work up No results for input(s): "VITAMINB12", "FOLATE", "FERRITIN", "TIBC", "IRON", "RETICCTPCT" in the last 72 hours. Microbiology Recent Results (from the past 240 hour(s))  Culture, blood (routine x 2)     Status: None (Preliminary result)   Collection Time: 07/01/22  6:31 PM   Specimen: BLOOD  Result Value Ref Range Status  Specimen Description BLOOD SITE NOT SPECIFIED  Final   Special Requests   Final    BOTTLES DRAWN AEROBIC AND ANAEROBIC Blood Culture adequate volume   Culture   Final    NO GROWTH 4 DAYS Performed at [facility], [address], [place], [place] [address]    Report Status PENDING  Incomplete  Culture, blood (routine x 2)     Status: None (Preliminary result)   Collection Time: 07/01/22  6:36 PM   Specimen: BLOOD  Result Value Ref Range Status   Specimen Description BLOOD RIGHT HAND  Final   Special Requests   Final    BOTTLES DRAWN AEROBIC AND ANAEROBIC Blood Culture adequate volume   Culture   Final    NO GROWTH 4 DAYS Performed at [facility], [place] [address] [place], [place] [address]    Report Status PENDING  Incomplete  MRSA Next Gen by PCR, Nasal     Status: None   Collection Time: 07/02/22 12:58 AM   Specimen: Nasal Mucosa; Nasal Swab  Result Value Ref Range Status   MRSA by PCR Next Gen NOT DETECTED NOT DETECTED Final    Comment: (NOTE) The GeneXpert MRSA Assay (FDA approved for NASAL specimens only), is one component of a comprehensive MRSA colonization surveillance program. It is not intended to diagnose MRSA infection nor to guide or monitor treatment for MRSA infections. Test performance is not FDA approved in patients less than 14 years old. Performed at [facility], [place] [address], [place], [place] [address]     Time coordinating discharge: 45 minutes  Signed: Stephanye Finnicum  [facility] 07/06/2022, 2:01 PM

## 2022-07-06 NOTE — Inpatient Diabetes Management (Addendum)
Inpatient Diabetes Program Recommendations  AACE/ADA: New Consensus Statement on Inpatient Glycemic Control (2015)  Target Ranges:  Prepandial:   less than 140 mg/dL      Peak postprandial:   less than 180 mg/dL (1-2 hours)      Critically ill patients:  140 - 180 mg/dL   Lab Results  Component Value Date   GLUCAP 291 (H) 07/06/2022   HGBA1C 12.2 (H) 07/02/2022    Review of Glycemic Control  Latest Reference Range & Units 07/05/22 16:13 07/05/22 19:56 07/06/22 00:14 07/06/22 04:01 07/06/22 08:16  Glucose-Capillary 70 - 99 mg/dL 233 (H) 293 (H) 238 (H) 249 (H) 291 (H)  (H): Data is abnormally high Diabetes history: type 2 Outpatient Diabetes medications: Trulicity 1.5 mg (not able to get since December), Glucotrol 10 mg daily Current orders for Inpatient glycemic control: Semglee 50 units daily, Novolog 0-9 units correction scale TID, Novolog 0-5 units at HS, Novolog 4 units TID  Inpatient Diabetes Program Recommendations:    Consider further increasing Semglee to 65 units QD and increasing Novolog 7 units TID (assuming patient is consuming >50% of meals).   Did a benefit check on insurance coverage for insulin: per Lyndel Safe CPhT, Basalgar is a $25 copay, Humalog and Novolog are not on formulary for her insurance. Patient's PCP is not covered by her insurance, as well.   Thanks, Bronson Curb, MSN, RNC-OB Diabetes Coordinator [phone] (8a-5p)

## 2022-11-04 ENCOUNTER — Other Ambulatory Visit: Payer: Self-pay

## 2022-11-04 ENCOUNTER — Encounter (HOSPITAL_COMMUNITY): Payer: Self-pay | Admitting: Orthopedic Surgery

## 2022-11-04 NOTE — Progress Notes (Signed)
PCP - [facility] Medicine Cardiologist - n/a  Chest x-ray - 04/28/22 [facility] EKG - 07/01/22 Stress Test - n/a ECHO - 06/09/17 Cardiac Cath - n/a  ICD Pacemaker/Loop - n/a  Sleep Study -  Yes CPAP - does not use CPAP   Diabetes Type 2 Do not take Glipizide, Metformin or Insulin Glargine on the morning of surgery.  Hold Farxiga for 72 hours prior to surgery.  Last Farxiga dose was on 11/04/22.  (SDW call)       THE MORNING OF SURGERY, do not take Humalog Insulin unless your CBG is greater than 220 mg/dL.  If CBG is greater than 220 mg/dL, you may take  of your sliding scale (correction) dose of insulin.  If your blood sugar is less than 70 mg/dL, you will need to treat for low blood sugar: Treat a low blood sugar (less than 70 mg/dL) with  cup of clear juice (cranberry or apple), 4 glucose tablets, OR glucose gel. Recheck blood sugar in 15 minutes after treatment (to make sure it is greater than 70 mg/dL). If your blood sugar is not greater than 70 mg/dL on recheck, call [phone] for further instructions.  ERAS: Clear liquids til 12:30 PM DOS.  Anesthesia review: Yes  STOP now taking any Aspirin (unless otherwise instructed by your surgeon), Aleve, Naproxen, Ibuprofen, Motrin, Advil, Celebrex, Voltaren, Goody's, BC's, all herbal medications, fish oil, and all vitamins.   Coronavirus Screening Do you have any of the following symptoms:  Cough yes/no: No Fever (>100.66F)  Yes -r/t finger infection on and off Runny nose yes/no: No Sore throat yes/no: No Difficulty breathing/shortness of breath  yes/no: No  Have you traveled in the last 14 days and where? yes/no: No  Patient verbalized understanding of instructions that were given via phone.

## 2022-11-04 NOTE — Progress Notes (Signed)
Anesthesia Chart Review: Maury Dus  Case: [id number] Date/Time: 11/05/22 1515   Procedure: Right ring finger amputation (Right) - local 60   Anesthesia type: Monitor Anesthesia Care   Pre-op diagnosis: Right ring finger infection   [facility] ROOM 04 / [facility]   Surgeons: Marlyne Beards, MD       DISCUSSION: Patient is a 57 year old female scheduled for the above procedure.  History includes never smoker, DM2, exertional dyspnea, HLD, GERD, anemia, OSA (does not use CPAP), osteoarthritis (left TKA 04/26/07; right TKA 06/23/11), osteomyelitis (left BKA 09/10/17), finger amputations (related to infection: right long finger 06/11/17; right index finger 02/22/18; left middle finger 07/02/22).  A1c down to 8.8% in April 2024. She in on glipizide, metformin, insulin glargine, Farxiga, Humalog TID with meals. She is a same day work-up call on 11/04/22, so last Marcelline Deist was on 11/04/22.   Anesthesia team to evaluate on the day of surgery. Per PAT RN, patient reported some intermittent fevers related to her finger infection, but otherwise denied respiratory or other sick symptome.    VS: Ht 5\' 4"  (1.626 m)   Wt 118.4 kg   LMP 04/26/2016   BMI 44.80 kg/m  BP Readings from Last 3 Encounters:  07/06/22 120/64  03/18/22 108/82  03/08/22 (!) 113/48   Pulse Readings from Last 3 Encounters:  07/06/22 81  03/18/22 84  03/08/22 89    PROVIDERS: [name], MD is PCP    LABS: Most recent lab results in [facility] include: Lab Results  Component Value Date   WBC 5.0 07/06/2022   HGB 10.5 (L) 07/06/2022   HCT 32.1 (L) 07/06/2022   PLT 205 07/06/2022   GLUCOSE 248 (H) 07/06/2022   ALT 24 07/01/2022   AST 30 07/01/2022   NA 135 07/06/2022   K 3.8 07/06/2022   CL 102 07/06/2022   CREATININE 0.97 07/06/2022   BUN 17 07/06/2022   CO2 21 (L) 07/06/2022  A1c 8.8% 08/05/22 ([facility]), down from 12.2 % on 07/02/22.    EKG: 07/01/22: Sinus rhythm Low voltage, precordial leads No  significant change since last tracing 12 July 2017 Confirmed by Margarita Grizzle [phone]) on 07/02/2022 10:46:21 AM   CV: Echo 09/09/17 ([facility]): NORMAL LEFT VENTRICULAR SYSTOLIC FUNCTION WITH MILD LVH  NORMAL RIGHT VENTRICULAR SYSTOLIC FUNCTION  VALVULAR REGURGITATION: MILD MR, MILD PR, MILD TR  VALVULAR STENOSIS: TRIVIAL AS  NO OBVIOUS SIGN OF ENDOCARDITIS IN THE SETTING OF POOR SOUND TRANSMISSION  NO PRIOR STUDY FOR COMPARISON   US Carotid 03/17/12: Summary:  Bilateral: mild soft plaque noted origin ICA. No signifcant  ICA stenosis. Vertebral artery flow is antegrade.    Past Medical History:  Diagnosis Date   Acute bacterial bronchitis 05/16/2022   in [facility]   Anemia    hx   Arthritis    Charcot's joint of right foot    Hx - DM 2   Depression    patient denies this dx as of 11/04/22   Diabetes mellitus without complication (HCC)    type 2   DOE (dyspnea on exertion) 07/12/2017   FUO (fever of unknown origin) 07/12/2017   Gastroenteritis 04/2016   GERD (gastroesophageal reflux disease)    Headache(784.0)    hormonal headache - no problems since menopause   HLD (hyperlipidemia)    crestor   Low back pain 07/12/2017   Neuromuscular disorder (HCC)    neuropathy - right foot and hands   Obese    Pleuritic chest pain  07/12/2017   Pneumonia    hx 20+ years ago   RSV infection 03/2022   resolved   Sleep apnea    does not use CPAP   Ulcer of foot (HCC)    toe ulcer greater than a year ago    Past Surgical History:  Procedure Laterality Date   AMPUTATION Right 06/11/2017   Procedure: RIGHT LONG FINGER AMPUTATION DIGIT;  Surgeon: Mack Hook, MD;  Location: [facility] OR;  Service: Orthopedics;  Laterality: Right;   AMPUTATION Right 02/22/2018   Procedure: AMPUTATION RIGHT INDEX FINGER;  Surgeon: Knute Neu, MD;  [facility]S;  Service: Plastics;  Laterality: Right;   AMPUTATION Left 07/02/2022   Procedure: AMPUTATION OF LEFT MIDDLE FINGER;  Surgeon: Marlyne Beards, MD;  [facility];  Service: Orthopedics;  Laterality: Left;   ANKLE FUSION Left 08/03/2014   Procedure: Left Tibiocalcaneal Fusion;  Surgeon: Nadara Mustard, MD;  [facility];  Service: Orthopedics;  Laterality: Left;   APPLICATION OF WOUND VAC Left 08/03/2014   Procedure: APPLICATION OF WOUND VAC;  Surgeon: Nadara Mustard, MD;  [facility];  Service: Orthopedics;  Laterality: Left;   BELOW KNEE LEG AMPUTATION Left 10/08/2017   in [facility]   I & D EXTREMITY Left 08/01/2020   Procedure: IRRIGATION AND DEBRIDEMENT EXTREMITY,PARTIAL AMPUTATION LEFT INDEX FINGER;  Surgeon: Ernest Mallick, MD;  [facility];  Service: Orthopedics;  Laterality: Left;   I & D EXTREMITY Left 08/06/2020   Procedure: IRRIGATION AND DEBRIDEMENT EXTREMITY;  Surgeon: Ernest Mallick, MD;  [facility];  Service: Orthopedics;  Laterality: Left;   I & D KNEE WITH POLY EXCHANGE Right 01/31/2013   Procedure: IRRIGATION AND DEBRIDEMENT KNEE WITH POLY EXCHANGE possible Antibiotic Spacer;  Surgeon: Valeria Batman, MD;  [facility];  Service: Orthopedics;  Laterality: Right;   INCISION AND DRAINAGE Right 01/31/2013   ANTIBIOTIC SPACER  RIGHT KNEE  DR [facility]    INCISION AND DRAINAGE ABSCESS Left 08/03/2020   Procedure: INCISION AND DEBRIDEMENT LEFT INDEX FINGER;  Surgeon: Ernest Mallick, MD;  [facility];  Service: Orthopedics;  Laterality: Left;   KNEE ARTHROSCOPY Left    MRI  04/07/2019   in [facility]   ORIF ANKLE FRACTURE Left 03/16/2014   Procedure: Foot Excision Charcot Collapse,  Internal Fixation;  Surgeon: Nadara Mustard, MD;  [facility];  Service: Orthopedics;  Laterality: Left;   REPLACEMENT TOTAL KNEE Left    TOTAL KNEE ARTHROPLASTY  06/23/2011   Procedure: RIGHT TOTAL KNEE ARTHROPLASTY;  Surgeon: Valeria Batman, MD;  Location: [facility] OR;  Service: Orthopedics;  Laterality: Right;  RIGHT TOTAL KNEE REPLACEMENT    MEDICATIONS: No current facility-administered  medications for this encounter.    dapagliflozin propanediol (FARXIGA) 10 MG TABS tablet   diclofenac (VOLTAREN) 75 MG EC tablet   DULoxetine (CYMBALTA) 60 MG capsule   gabapentin (NEURONTIN) 600 MG tablet   glipiZIDE (GLUCOTROL XL) 10 MG 24 hr tablet   GLUCOSAMINE CHONDROITIN MSM PO   HYDROcodone-acetaminophen (NORCO) 10-325 MG tablet   Insulin Glargine (BASAGLAR KWIKPEN) 100 UNIT/ML   insulin lispro (HUMALOG) 100 UNIT/ML injection   metFORMIN (GLUCOPHAGE) 1000 MG tablet   Multiple Vitamin (MULTIVITAMIN WITH MINERALS) TABS   oxybutynin (DITROPAN-XL) 10 MG 24 hr tablet   pantoprazole (PROTONIX) 40 MG tablet   rosuvastatin (CRESTOR) 20 MG tablet   Blood Glucose Monitoring Suppl DEVI   glucosamine-chondroitin 500-400 MG tablet   Glucose Blood (BLOOD  GLUCOSE TEST STRIPS) STRP   Insulin Pen Needle (PEN NEEDLES) 31G X 5 MM MISC   Lancet Device MISC   Lancets MISC    Shonna Chock, PA-C Surgical Short Stay/Anesthesiology [facility] Phone [phone] [facility] Phone [phone] 11/04/2022 1:28 PM

## 2022-11-04 NOTE — Anesthesia Preprocedure Evaluation (Signed)
Anesthesia Evaluation  Patient identified by MRN, date of birth, ID band Patient awake    Reviewed: Allergy & Precautions, H&P , NPO status , Patient's Chart, lab work & pertinent test results  Airway Mallampati: II  TM Distance: >3 FB Neck ROM: Full    Dental no notable dental hx. (+) Teeth Intact, Dental Advisory Given   Pulmonary sleep apnea    Pulmonary exam normal breath sounds clear to auscultation       Cardiovascular + DOE   Rhythm:Regular Rate:Normal     Neuro/Psych  Headaches   Depression       GI/Hepatic Neg liver ROS,GERD  Medicated,,  Endo/Other  diabetes, Type 2, Insulin Dependent, Oral Hypoglycemic Agents  Morbid obesity  Renal/GU negative Renal ROS  negative genitourinary   Musculoskeletal  (+) Arthritis , Osteoarthritis,    Abdominal   Peds  Hematology  (+) Blood dyscrasia, anemia   Anesthesia Other Findings   Reproductive/Obstetrics negative OB ROS                             Anesthesia Physical Anesthesia Plan  ASA: 3  Anesthesia Plan: MAC   Post-op Pain Management: Tylenol PO (pre-op)*   Induction: Intravenous  PONV Risk Score and Plan: 3 and Ondansetron, Propofol infusion and Midazolam  Airway Management Planned: Natural Airway and Simple Face Mask  Additional Equipment:   Intra-op Plan:   Post-operative Plan:   Informed Consent: I have reviewed the patients History and Physical, chart, labs and discussed the procedure including the risks, benefits and alternatives for the proposed anesthesia with the patient or authorized representative who has indicated his/her understanding and acceptance.     Dental advisory given  Plan Discussed with: CRNA  Anesthesia Plan Comments: (PAT note written 11/04/2022 by Shonna Chock, PA-C.  )       Anesthesia Quick Evaluation

## 2022-11-05 ENCOUNTER — Other Ambulatory Visit: Payer: Self-pay

## 2022-11-05 ENCOUNTER — Ambulatory Visit (HOSPITAL_BASED_OUTPATIENT_CLINIC_OR_DEPARTMENT_OTHER): Payer: Commercial Managed Care - HMO | Admitting: Vascular Surgery

## 2022-11-05 ENCOUNTER — Ambulatory Visit (HOSPITAL_COMMUNITY)
Admission: RE | Admit: 2022-11-05 | Discharge: 2022-11-05 | Disposition: A | Discharge status: Home / Self Care | Payer: Commercial Managed Care - HMO | Attending: Orthopedic Surgery | Admitting: Orthopedic Surgery

## 2022-11-05 ENCOUNTER — Encounter (HOSPITAL_COMMUNITY): Payer: Self-pay | Admitting: Orthopedic Surgery

## 2022-11-05 ENCOUNTER — Encounter (HOSPITAL_COMMUNITY)
Admission: RE | Disposition: A | Discharge status: Home / Self Care | Payer: Self-pay | Source: Home / Self Care | Attending: Orthopedic Surgery

## 2022-11-05 ENCOUNTER — Ambulatory Visit (HOSPITAL_COMMUNITY): Payer: Commercial Managed Care - HMO | Admitting: Vascular Surgery

## 2022-11-05 DIAGNOSIS — L02511 Cutaneous abscess of right hand: Secondary | ICD-10-CM | POA: Insufficient documentation

## 2022-11-05 DIAGNOSIS — Z89512 Acquired absence of left leg below knee: Secondary | ICD-10-CM | POA: Diagnosis not present

## 2022-11-05 DIAGNOSIS — Z7985 Long-term (current) use of injectable non-insulin antidiabetic drugs: Secondary | ICD-10-CM | POA: Diagnosis not present

## 2022-11-05 DIAGNOSIS — M869 Osteomyelitis, unspecified: Secondary | ICD-10-CM | POA: Diagnosis not present

## 2022-11-05 DIAGNOSIS — G4733 Obstructive sleep apnea (adult) (pediatric): Secondary | ICD-10-CM | POA: Insufficient documentation

## 2022-11-05 DIAGNOSIS — K219 Gastro-esophageal reflux disease without esophagitis: Secondary | ICD-10-CM | POA: Insufficient documentation

## 2022-11-05 DIAGNOSIS — E1169 Type 2 diabetes mellitus with other specified complication: Secondary | ICD-10-CM

## 2022-11-05 DIAGNOSIS — E1140 Type 2 diabetes mellitus with diabetic neuropathy, unspecified: Secondary | ICD-10-CM | POA: Diagnosis not present

## 2022-11-05 DIAGNOSIS — F32A Depression, unspecified: Secondary | ICD-10-CM | POA: Diagnosis not present

## 2022-11-05 DIAGNOSIS — Z794 Long term (current) use of insulin: Secondary | ICD-10-CM | POA: Diagnosis not present

## 2022-11-05 DIAGNOSIS — Z7984 Long term (current) use of oral hypoglycemic drugs: Secondary | ICD-10-CM | POA: Diagnosis not present

## 2022-11-05 DIAGNOSIS — E1142 Type 2 diabetes mellitus with diabetic polyneuropathy: Secondary | ICD-10-CM | POA: Insufficient documentation

## 2022-11-05 DIAGNOSIS — E785 Hyperlipidemia, unspecified: Secondary | ICD-10-CM | POA: Diagnosis not present

## 2022-11-05 DIAGNOSIS — Z6841 Body Mass Index (BMI) 40.0 and over, adult: Secondary | ICD-10-CM | POA: Diagnosis not present

## 2022-11-05 DIAGNOSIS — M1990 Unspecified osteoarthritis, unspecified site: Secondary | ICD-10-CM | POA: Insufficient documentation

## 2022-11-05 HISTORY — DX: Hyperlipidemia, unspecified: E78.5

## 2022-11-05 HISTORY — DX: Myoneural disorder, unspecified: G70.9

## 2022-11-05 HISTORY — PX: AMPUTATION: SHX166

## 2022-11-05 HISTORY — DX: Charcot's joint, right ankle and foot: M14.671

## 2022-11-05 HISTORY — DX: Gastro-esophageal reflux disease without esophagitis: K21.9

## 2022-11-05 HISTORY — DX: Anemia, unspecified: D64.9

## 2022-11-05 LAB — GLUCOSE, CAPILLARY
Glucose-Capillary: 156 mg/dL — ABNORMAL HIGH (ref 70–99)
Glucose-Capillary: 112 mg/dL — ABNORMAL HIGH (ref 70–99)
Glucose-Capillary: 124 mg/dL — ABNORMAL HIGH (ref 70–99)

## 2022-11-05 LAB — BASIC METABOLIC PANEL
Anion gap: 18 — ABNORMAL HIGH (ref 5–15)
BUN: 19 mg/dL (ref 6–20)
CO2: 21 mmol/L — ABNORMAL LOW (ref 22–32)
Calcium: 9.4 mg/dL (ref 8.9–10.3)
Chloride: 101 mmol/L (ref 98–111)
Creatinine, Ser: 1.03 mg/dL — ABNORMAL HIGH (ref 0.44–1.00)
GFR, Estimated: >60 mL/min (ref >60)
Glucose, Bld: 157 mg/dL — ABNORMAL HIGH (ref 70–99)
Potassium: 4.3 mmol/L (ref 3.5–5.1)
Sodium: 140 mmol/L (ref 135–145)

## 2022-11-05 LAB — CBC
HCT: 35.9 % — ABNORMAL LOW (ref 36.0–46.0)
Hemoglobin: 10.9 g/dL — ABNORMAL LOW (ref 12.0–15.0)
MCH: 27.5 pg (ref 26.0–34.0)
MCHC: 30.4 g/dL (ref 30.0–36.0)
MCV: 90.4 fL (ref 80.0–100.0)
Platelets: 280 10*3/uL (ref 150–400)
RBC: 3.97 MIL/uL (ref 3.87–5.11)
RDW: 15.7 % — ABNORMAL HIGH (ref 11.5–15.5)
WBC: 9.4 10*3/uL (ref 4.0–10.5)
nRBC: 0 % (ref 0.0–0.2)

## 2022-11-05 SURGERY — AMPUTATION DIGIT
Anesthesia: Monitor Anesthesia Care | Laterality: Right

## 2022-11-05 MED ORDER — ACETAMINOPHEN 500 MG PO TABS
ORAL_TABLET | ORAL | Status: AC
  Filled 2022-11-05: qty 2

## 2022-11-05 MED ORDER — VANCOMYCIN HCL 1000 MG IV SOLR: INTRAVENOUS | Status: DC | PRN

## 2022-11-05 MED ORDER — VANCOMYCIN HCL 1500 MG/300ML IV SOLN
INTRAVENOUS | Status: AC
  Filled 2022-11-05: qty 300

## 2022-11-05 MED ORDER — BUPIVACAINE HCL (PF) 0.25 % IJ SOLN
INTRAMUSCULAR | Status: AC
  Filled 2022-11-05: qty 30

## 2022-11-05 MED ORDER — OXYCODONE HCL 5 MG PO TABS: 5.0000 mg | ORAL_TABLET | Freq: Four times a day (QID) | ORAL | 0 refills | Status: AC | PRN

## 2022-11-05 MED ORDER — 0.9 % SODIUM CHLORIDE (POUR BTL) OPTIME
TOPICAL | Status: DC | PRN
  Administered 2022-11-05: 1000 mL

## 2022-11-05 MED ORDER — CHLORHEXIDINE GLUCONATE 0.12 % MT SOLN
OROMUCOSAL | Status: AC
  Administered 2022-11-05: 15 mL via OROMUCOSAL
  Filled 2022-11-05: qty 15

## 2022-11-05 MED ORDER — AMOXICILLIN-POT CLAVULANATE 875-125 MG PO TABS: 1.0000 | ORAL_TABLET | Freq: Two times a day (BID) | ORAL | 0 refills | Status: AC

## 2022-11-05 MED ORDER — PROPOFOL 500 MG/50ML IV EMUL
INTRAVENOUS | Status: DC | PRN
  Administered 2022-11-05: 125 ug/kg/min via INTRAVENOUS

## 2022-11-05 MED ORDER — MIDAZOLAM HCL 2 MG/2ML IJ SOLN
INTRAMUSCULAR | Status: DC | PRN
  Administered 2022-11-05: 2 mg via INTRAVENOUS

## 2022-11-05 MED ORDER — ACETAMINOPHEN 500 MG PO TABS
1000.0000 mg | ORAL_TABLET | Freq: Once | ORAL | Status: AC
  Administered 2022-11-05: 1000 mg via ORAL

## 2022-11-05 MED ORDER — AMOXICILLIN-POT CLAVULANATE 875-125 MG PO TABS: 1.0000 | ORAL_TABLET | Freq: Two times a day (BID) | ORAL | 0 refills | Status: DC

## 2022-11-05 MED ORDER — BUPIVACAINE HCL (PF) 0.25 % IJ SOLN
INTRAMUSCULAR | Status: DC | PRN
  Administered 2022-11-05: 10 mL

## 2022-11-05 MED ORDER — CHLORHEXIDINE GLUCONATE 0.12 % MT SOLN: 15.0000 mL | Freq: Once | OROMUCOSAL | Status: AC

## 2022-11-05 MED ORDER — DOXYCYCLINE MONOHYDRATE 100 MG PO TABS: 100.0000 mg | ORAL_TABLET | Freq: Two times a day (BID) | ORAL | 0 refills | Status: AC

## 2022-11-05 MED ORDER — INSULIN ASPART 100 UNIT/ML IJ SOLN
0.0000 [IU] | INTRAMUSCULAR | Status: DC | PRN
  Administered 2022-11-05: 2 [IU] via SUBCUTANEOUS
  Filled 2022-11-05: qty 1

## 2022-11-05 MED ORDER — LACTATED RINGERS IV SOLN: INTRAVENOUS | Status: DC

## 2022-11-05 MED ORDER — LEVOFLOXACIN IN D5W 500 MG/100ML IV SOLN: 500.0000 mg | INTRAVENOUS | Status: DC

## 2022-11-05 MED ORDER — VANCOMYCIN HCL 1500 MG/300ML IV SOLN
1500.0000 mg | INTRAVENOUS | Status: AC
  Administered 2022-11-05: 1500 mg via INTRAVENOUS

## 2022-11-05 MED ORDER — PROPOFOL 10 MG/ML IV BOLUS
INTRAVENOUS | Status: AC
  Filled 2022-11-05: qty 20

## 2022-11-05 MED ORDER — MIDAZOLAM HCL 2 MG/2ML IJ SOLN
INTRAMUSCULAR | Status: AC
  Filled 2022-11-05: qty 2

## 2022-11-05 MED ORDER — FENTANYL CITRATE (PF) 250 MCG/5ML IJ SOLN
INTRAMUSCULAR | Status: AC
  Filled 2022-11-05: qty 5

## 2022-11-05 MED ORDER — ORAL CARE MOUTH RINSE: 15.0000 mL | Freq: Once | OROMUCOSAL | Status: AC

## 2022-11-05 MED ORDER — PROPOFOL 10 MG/ML IV BOLUS
INTRAVENOUS | Status: DC | PRN
Start: 2022-11-05 — End: 2022-11-05
  Administered 2022-11-05: 20 mg via INTRAVENOUS
  Administered 2022-11-05: 40 mg via INTRAVENOUS

## 2022-11-05 MED ORDER — LEVOFLOXACIN IN D5W 500 MG/100ML IV SOLN
INTRAVENOUS | Status: AC
  Filled 2022-11-05: qty 100

## 2022-11-05 MED ORDER — DOXYCYCLINE MONOHYDRATE 100 MG PO TABS: 100.0000 mg | ORAL_TABLET | Freq: Two times a day (BID) | ORAL | 0 refills | Status: DC

## 2022-11-05 SURGICAL SUPPLY — 54 items
BAG COUNTER SPONGE SURGICOUNT (BAG) ×1 IMPLANT
BAG SPNG CNTER NS LX DISP (BAG)
BNDG CMPR 5X2 KNTD ELC UNQ LF (GAUZE/BANDAGES/DRESSINGS) ×1
BNDG CMPR 5X3 KNIT ELC UNQ LF (GAUZE/BANDAGES/DRESSINGS)
BNDG CMPR 75X21 PLY HI ABS (MISCELLANEOUS)
BNDG COHESIVE 1X5 TAN STRL LF (GAUZE/BANDAGES/DRESSINGS) ×1 IMPLANT
BNDG ELASTIC 2INX 5YD STR LF (GAUZE/BANDAGES/DRESSINGS) IMPLANT
BNDG ELASTIC 2X5.8 VLCR STR LF (GAUZE/BANDAGES/DRESSINGS) ×1 IMPLANT
BNDG ELASTIC 3INX 5YD STR LF (GAUZE/BANDAGES/DRESSINGS) IMPLANT
BNDG ELASTIC 4X5.8 VLCR STR LF (GAUZE/BANDAGES/DRESSINGS) IMPLANT
BNDG GAUZE DERMACEA FLUFF 4 (GAUZE/BANDAGES/DRESSINGS) ×1 IMPLANT
BNDG GZE DERMACEA 4 6PLY (GAUZE/BANDAGES/DRESSINGS) ×1
CNTNR URN SCR LID CUP LEK RST (MISCELLANEOUS) IMPLANT
CONT SPEC 4OZ STRL OR WHT (MISCELLANEOUS) ×1
CORD BIPOLAR FORCEPS 12FT (ELECTRODE) ×1 IMPLANT
COVER SURGICAL LIGHT HANDLE (MISCELLANEOUS) ×1 IMPLANT
CUFF TOURN SGL QUICK 18X4 (TOURNIQUET CUFF) ×1 IMPLANT
CUFF TOURN SGL QUICK 24 (TOURNIQUET CUFF)
CUFF TRNQT CYL 24X4X16.5-23 (TOURNIQUET CUFF) IMPLANT
DRAPE SURG 17X23 STRL (DRAPES) ×1 IMPLANT
DRSG ADAPTIC 3X8 NADH LF (GAUZE/BANDAGES/DRESSINGS) ×1 IMPLANT
DRSG XEROFORM 1X8 (GAUZE/BANDAGES/DRESSINGS) IMPLANT
GAUZE SPONGE 2X2 8PLY STRL LF (GAUZE/BANDAGES/DRESSINGS) IMPLANT
GAUZE SPONGE 4X4 12PLY STRL (GAUZE/BANDAGES/DRESSINGS) IMPLANT
GAUZE STRETCH 2X75IN STRL (MISCELLANEOUS) IMPLANT
GLOVE BIO SURGEON STRL SZ7 (GLOVE) ×1 IMPLANT
GLOVE SURG UNDER POLY LF SZ7 (GLOVE) ×1 IMPLANT
GOWN STRL REUS W/ TWL LRG LVL3 (GOWN DISPOSABLE) ×2 IMPLANT
GOWN STRL REUS W/ TWL XL LVL3 (GOWN DISPOSABLE) ×1 IMPLANT
GOWN STRL REUS W/TWL LRG LVL3 (GOWN DISPOSABLE) ×1
GOWN STRL REUS W/TWL XL LVL3 (GOWN DISPOSABLE) ×1
KIT BASIN OR (CUSTOM PROCEDURE TRAY) ×1 IMPLANT
KIT TURNOVER KIT B (KITS) ×1 IMPLANT
MANIFOLD NEPTUNE II (INSTRUMENTS) ×1 IMPLANT
NDL HYPO 25GX1X1/2 BEV (NEEDLE) IMPLANT
NEEDLE HYPO 25GX1X1/2 BEV (NEEDLE) ×1 IMPLANT
NS IRRIG 1000ML POUR BTL (IV SOLUTION) ×1 IMPLANT
PACK ORTHO EXTREMITY (CUSTOM PROCEDURE TRAY) ×1 IMPLANT
PAD ARMBOARD 7.5X6 YLW CONV (MISCELLANEOUS) ×2 IMPLANT
PAD CAST 4YDX4 CTTN HI CHSV (CAST SUPPLIES) IMPLANT
PADDING CAST COTTON 4X4 STRL (CAST SUPPLIES) ×1
SOAP 2 % CHG 4 OZ (WOUND CARE) ×1 IMPLANT
SPECIMEN JAR SMALL (MISCELLANEOUS) ×1 IMPLANT
SUT MERSILENE 4 0 P 3 (SUTURE) IMPLANT
SUT PROLENE 4 0 PS 2 18 (SUTURE) IMPLANT
SWAB COLLECTION DEVICE MRSA (MISCELLANEOUS) IMPLANT
SWAB CULTURE ESWAB REG 1ML (MISCELLANEOUS) IMPLANT
SYR CONTROL 10ML LL (SYRINGE) IMPLANT
TOURNI-COT LRG GREEN (MISCELLANEOUS) ×1 IMPLANT
TOURNIQUET ADLT LRG GRN (MISCELLANEOUS) IMPLANT
TOWEL GREEN STERILE (TOWEL DISPOSABLE) ×1 IMPLANT
TOWEL GREEN STERILE FF (TOWEL DISPOSABLE) ×1 IMPLANT
TUBE CONNECTING 12X1/4 (SUCTIONS) IMPLANT
WATER STERILE IRR 1000ML POUR (IV SOLUTION) ×1 IMPLANT

## 2022-11-05 NOTE — Anesthesia Postprocedure Evaluation (Signed)
Anesthesia Post Note  Patient: Evania R Jutras  Procedure(s) Performed: Right ring finger amputation (Right)     Patient location during evaluation: PACU Anesthesia Type: MAC Level of consciousness: awake and alert Pain management: pain level controlled Vital Signs Assessment: post-procedure vital signs reviewed and stable Respiratory status: spontaneous breathing, nonlabored ventilation, respiratory function stable and patient connected to nasal cannula oxygen Cardiovascular status: stable and blood pressure returned to baseline Postop Assessment: no apparent nausea or vomiting Anesthetic complications: no  No notable events documented.  Last Vitals:  Vitals:   11/05/22 1915 11/05/22 1930  BP: 115/68 124/68  Pulse: 71 74  Resp: 19 19  Temp:  36.7 C  SpO2: 100% 98%    Last Pain:  Vitals:   11/05/22 1930  TempSrc:   PainSc: 0-No pain                 Kennidy Lamke S

## 2022-11-05 NOTE — Interval H&P Note (Signed)
History and Physical Interval Note:  11/05/2022 5:54 PM  [name]  has presented today for surgery, with the diagnosis of Right ring finger infection.  The various methods of treatment have been discussed with the patient and family. After consideration of risks, benefits and other options for treatment, the patient has consented to  Procedure(s) with comments: Right ring finger amputation (Right) - local 60 as a surgical intervention.  The patient's history has been reviewed, patient examined, no change in status, stable for surgery.  I have reviewed the patient's chart and labs.  Questions were answered to the patient's satisfaction.     [name]

## 2022-11-05 NOTE — H&P (Signed)
HAND SURGERY   HPI: Patient is a 57 y.o. female who presents with significant soft tissue infection with osteomyelitis of the right ring finger.  The drainage and appearance of her finger has steadily worsened this week.  Patient has a history of significant diabetic neuropathy and has undergone amputation of most of her fingers on both sides and a left BKA.  She presented to my office this week at which time she elected to proceed with finger amputation.  Patient denies any changes to their medical history or new systemic symptoms today.    Past Medical History:  Diagnosis Date   Acute bacterial bronchitis 05/16/2022   in [facility]   Anemia    hx   Arthritis    Charcot's joint of right foot    Hx - DM 2   Depression    patient denies this dx as of 11/04/22   Diabetes mellitus without complication (HCC)    type 2   DOE (dyspnea on exertion) 07/12/2017   FUO (fever of unknown origin) 07/12/2017   Gastroenteritis 04/2016   GERD (gastroesophageal reflux disease)    Headache(784.0)    hormonal headache - no problems since menopause   HLD (hyperlipidemia)    crestor   Low back pain 07/12/2017   Neuromuscular disorder (HCC)    neuropathy - right foot and hands   Obese    Pleuritic chest pain 07/12/2017   Pneumonia    hx 20+ years ago   RSV infection 03/2022   resolved   Sleep apnea    does not use CPAP   Ulcer of foot (HCC)    toe ulcer greater than a year ago   Past Surgical History:  Procedure Laterality Date   AMPUTATION Right 06/11/2017   Procedure: RIGHT LONG FINGER AMPUTATION DIGIT;  Surgeon: Mack Hook, MD;  Location: [facility] OR;  Service: Orthopedics;  Laterality: Right;   AMPUTATION Right 02/22/2018   Procedure: AMPUTATION RIGHT INDEX FINGER;  Surgeon: Knute Neu, MD;  [facility];  Service: Plastics;  Laterality: Right;   AMPUTATION Left 07/02/2022   Procedure: AMPUTATION OF LEFT MIDDLE FINGER;  Surgeon: Marlyne Beards, MD;  [facility];  Service:  Orthopedics;  Laterality: Left;   ANKLE FUSION Left 08/03/2014   Procedure: Left Tibiocalcaneal Fusion;  Surgeon: Nadara Mustard, MD;  [facility];  Service: Orthopedics;  Laterality: Left;   APPLICATION OF WOUND VAC Left 08/03/2014   Procedure: APPLICATION OF WOUND VAC;  Surgeon: Nadara Mustard, MD;  [facility];  Service: Orthopedics;  Laterality: Left;   BELOW KNEE LEG AMPUTATION Left 10/08/2017   in [facility]   I & D EXTREMITY Left 08/01/2020   Procedure: IRRIGATION AND DEBRIDEMENT EXTREMITY,PARTIAL AMPUTATION LEFT INDEX FINGER;  Surgeon: Ernest Mallick, MD;  [facility];  Service: Orthopedics;  Laterality: Left;   I & D EXTREMITY Left 08/06/2020   Procedure: IRRIGATION AND DEBRIDEMENT EXTREMITY;  Surgeon: Ernest Mallick, MD;  [facility];  Service: Orthopedics;  Laterality: Left;   I & D KNEE WITH POLY EXCHANGE Right 01/31/2013   Procedure: IRRIGATION AND DEBRIDEMENT KNEE WITH POLY EXCHANGE possible Antibiotic Spacer;  Surgeon: Valeria Batman, MD;  [facility];  Service: Orthopedics;  Laterality: Right;   INCISION AND DRAINAGE Right 01/31/2013   ANTIBIOTIC SPACER  RIGHT KNEE  DR [facility]    INCISION AND DRAINAGE ABSCESS Left 08/03/2020   Procedure: INCISION AND DEBRIDEMENT LEFT INDEX FINGER;  Surgeon: Ernest Mallick, MD;  [facility];  Service: Orthopedics;  Laterality: Left;   KNEE ARTHROSCOPY Left    MRI  04/07/2019   in [facility]   ORIF ANKLE FRACTURE Left 03/16/2014   Procedure: Foot Excision Charcot Collapse,  Internal Fixation;  Surgeon: Nadara Mustard, MD;  [facility];  Service: Orthopedics;  Laterality: Left;   REPLACEMENT TOTAL KNEE Left    TOTAL KNEE ARTHROPLASTY  06/23/2011   Procedure: RIGHT TOTAL KNEE ARTHROPLASTY;  Surgeon: Valeria Batman, MD;  Location: [facility] OR;  Service: Orthopedics;  Laterality: Right;  RIGHT TOTAL KNEE REPLACEMENT   Social History   Socioeconomic History   Marital status: Married    Spouse name: Not on  file   Number of children: Not on file   Years of education: Not on file   Highest education level: Not on file  Occupational History   Not on file  Tobacco Use   Smoking status: Never   Smokeless tobacco: Never  Vaping Use   Vaping status: Never Used  Substance and Sexual Activity   Alcohol use: Yes    Comment: occ mixed drink   Drug use: No   Sexual activity: Yes    Birth control/protection: Post-menopausal  Other Topics Concern   Not on file  Social History Narrative   Not on file   Social Determinants of Health   Financial Resource Strain: Low Risk  (08/02/2022)   Received from [facility], [facility]   Overall Financial Resource Strain (CARDIA)    Difficulty of Paying Living Expenses: Not very hard  Food Insecurity: No Food Insecurity (08/02/2022)   Received from [facility], [facility]   Hunger Vital Sign    Worried About Running Out of Food in the Last Year: Never true    Ran Out of Food in the Last Year: Never true  Transportation Needs: No Transportation Needs (08/02/2022)   Received from [facility], [facility]   PRAPARE - Transportation    Lack of Transportation (Medical): No    Lack of Transportation (Non-Medical): No  Physical Activity: Insufficiently Active (08/02/2022)   Received from [facility], [facility]   Exercise Vital Sign    Days of Exercise per Week: 3 days    Minutes of Exercise per Session: 20 min  Stress: No Stress Concern Present (08/02/2022)   Received from [facility], [facility] - Occupational Stress Questionnaire    Feeling of Stress : Not at all  Social Connections: Moderately Integrated (08/02/2022)   Received from [facility], [facility]   Social Network    How would you rate your social network (family, work, friends)?: Adequate participation with social networks   Family History  Problem Relation Age of Onset   Anesthesia problems Father    Stroke Father     Hypotension Neg Hx    Malignant hyperthermia Neg Hx    Pseudochol deficiency Neg Hx    - negative except otherwise stated in the family history section Allergies  Allergen Reactions   Rocephin [Ceftriaxone] Shortness Of Breath and Swelling    Tolerated course of Rocephin 6/19 and multiple courses of Ancef/Keflex as well as Cefepime   Prior to Admission medications   Medication Sig Start Date End Date Taking? Authorizing Provider  dapagliflozin propanediol (FARXIGA) 10 MG TABS tablet Take 10 mg by mouth in the morning.   Yes [provider]  diclofenac (VOLTAREN) 75 MG EC tablet Take 75 mg by mouth 2 (two) times  daily.  02/28/14  Yes [provider]  Dulaglutide (TRULICITY) 1.5 MG/0.5ML SOPN Inject 1.5 mg into the skin once a week. On Mondays   Yes [provider]  DULoxetine (CYMBALTA) 60 MG capsule Take 60 mg by mouth in the morning.   Yes [provider]  gabapentin (NEURONTIN) 600 MG tablet Take 600 mg by mouth See admin instructions. Take 1 tablet (600 mg) by mouth scheduled twice daily (morning & night), may take an additional dose at noon if needed for pain. 04/23/19  Yes [provider]  glipiZIDE (GLUCOTROL XL) 10 MG 24 hr tablet Take 10 mg by mouth daily with breakfast.  05/23/17  Yes [provider]  GLUCOSAMINE CHONDROITIN MSM PO Take 1 tablet by mouth in the morning.   Yes [provider]  glucosamine-chondroitin 500-400 MG tablet Take 2 tablets by mouth daily.    Yes [provider]  HYDROcodone-acetaminophen (NORCO) 10-325 MG tablet Take 1 tablet by mouth 2 (two) times daily as needed (pain.).   Yes [provider]  Insulin Glargine (BASAGLAR KWIKPEN) 100 UNIT/ML Inject 50 Units into the skin daily. May substitute as needed per insurance. Patient taking differently: Inject 60 Units into the skin in the morning. May substitute as needed per insurance. 07/06/22 11/04/23 Yes Dahal, Melina Schools, MD  insulin  lispro (HUMALOG) 100 UNIT/ML injection Inject 0-0.09 mLs (0-9 Units total) into the skin 3 (three) times daily with meals. CBG < 70: Implement Hypoglycemia Standing Orders and refer to Hypoglycemia Standing Orders sidebar report CBG 70 - 120: 0 units, CBG 121 - 150: 1 unit, CBG 151 - 200: 2 units, CBG 201 - 250: 3 units, CBG 251 - 300: 5 units, CBG 301 - 350: 7 units, CBG 351 - 400: 9 units Patient taking differently: Inject 20 Units into the skin 3 (three) times daily with meals. CBG < 70: Implement Hypoglycemia Standing Orders and refer to Hypoglycemia Standing Orders sidebar report CBG 70 - 120: 0 units, CBG 121 - 150: 1 unit, CBG 151 - 200: 2 units, CBG 201 - 250: 3 units, CBG 251 - 300: 5 units, CBG 301 - 350: 7 units, CBG 351 - 400: 9 units 07/06/22 11/03/24 Yes Dahal, Melina Schools, MD  metFORMIN (GLUCOPHAGE) 1000 MG tablet Take 1,000 mg by mouth in the morning.   Yes [provider]  Multiple Vitamin (MULTIVITAMIN WITH MINERALS) TABS Take 1 tablet by mouth in the morning.   Yes [provider]  oxybutynin (DITROPAN-XL) 10 MG 24 hr tablet Take 10 mg by mouth in the morning.   Yes [provider]  pantoprazole (PROTONIX) 40 MG tablet Take 40 mg by mouth daily before breakfast. 05/13/22  Yes [provider]  rosuvastatin (CRESTOR) 20 MG tablet Take 20 mg by mouth at bedtime.   Yes [provider]  Blood Glucose Monitoring Suppl DEVI 1 each by Does not apply route 3 (three) times daily. May dispense any manufacturer covered by patient's insurance. 07/06/22   Lorin Glass, MD  Glucose Blood (BLOOD GLUCOSE TEST STRIPS) STRP 1 each by Does not apply route 3 (three) times daily. Use as directed to check blood sugar. May dispense any manufacturer covered by patient's insurance and fits patient's device. 07/06/22   Dahal, Melina Schools, MD  Insulin Pen Needle (PEN NEEDLES) 31G X 5 MM MISC 1 each by Does not apply route 3 (three) times daily. May dispense any manufacturer covered by  patient's insurance. 07/06/22   Lorin Glass, MD  Lancet Device MISC  1 each by Does not apply route 3 (three) times daily. May dispense any manufacturer covered by patient's insurance. 07/06/22   Lorin Glass, MD  Lancets MISC 1 each by Does not apply route 3 (three) times daily. Use as directed to check blood sugar. May dispense any manufacturer covered by patient's insurance and fits patient's device. 07/06/22   Lorin Glass, MD   No results found. - Positive ROS: All other systems have been reviewed and were otherwise negative with the exception of those mentioned in the HPI and as above.  Physical Exam: General: No acute distress, resting comfortably Cardiovascular: BUE warm and well perfused, normal rate Respiratory: Normal WOB on RA Skin: Warm and dry Neurologic: Sensation intact distally Psychiatric: Patient is at baseline mood and affect  Right Hand:  Moderate swelling of the finger most pronounced at the PIP and distal.  There is purulent drainage from the finger tip with soft tissue necrosis.  The finger is dusky appearing from the level of the PIP joint and distal.  She has limited sensation in the finger at baseline. AROM of the finger limited by swelling and stiffness.   Assessment: 57 yo F w/ significant soft tissue infection and osteomyelitis of the right ring finger.   Plan: OR today for right ring finger amputation. Risks, benefits, and alternatives to surgery again discussed in the preoperative holding area.  Informed consent was signed.  All questions were answered.   Marlyne Beards, M.D. [facility] 5:50 PM

## 2022-11-05 NOTE — Transfer of Care (Signed)
Immediate Anesthesia Transfer of Care Note  Patient: [name]  Procedure(s) Performed: Right ring finger amputation (Right)  Patient Location: PACU  Anesthesia Type:MAC  Level of Consciousness: awake, drowsy, and patient cooperative  Airway & Oxygen Therapy: Patient Spontanous Breathing  Post-op Assessment: Report given to RN, Post -op Vital signs reviewed and stable, and Patient moving all extremities X 4  Post vital signs: Reviewed and stable  Last Vitals:  Vitals Value Taken Time  BP 124/68 11/05/22 1930  Temp 36.7 C 11/05/22 1930  Pulse 77 11/05/22 1935  Resp 15 11/05/22 1939  SpO2 83 % 11/05/22 1935  Vitals shown include unfiled device data.  Last Pain:  Vitals:   11/05/22 1930  TempSrc:   PainSc: 0-No pain         Complications: No notable events documented.

## 2022-11-05 NOTE — Op Note (Addendum)
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  She presents today for amputation of this right ring finger.  Risks, benefits, and alternatives to surgery were again discussed with the patient in the preoperative area. The patient wishes to proceed with surgery.  Informed consent was signed after our discussion.   PREOPERATIVE DIAGNOSIS:  Right ring finger distal phalangeal osteomyelitis Right ring finger abscess  POSTOPERATIVE DIAGNOSIS: Same.  PROCEDURE:  Amputation of right ring finger through PIP joint   SURGEON: Waylan Rocher, M.D.  ASSIST: None  ANESTHESIA:  Local, MAC  IV FLUIDS AND URINE: See anesthesia.  ESTIMATED BLOOD LOSS: <5 mL.  IMPLANTS: * No implants in log *   DRAINS: None  COMPLICATIONS: None  DESCRIPTION OF PROCEDURE: The patient was met in the preoperative holding area where the surgical site was marked and the consent form was signed.  The patient was then taken to the operating room and transferred to the operating table.  All bony prominences were well padded. Monitored sedation was induced.  A formal time-out was performed to confirm that this was the correct patient, surgery, side, and site. A digital block was performed using 10 cc of an equal mixture of 1% plain lidocaine and 0.25% plain marcaine.  The operative extremity was prepped and draped in the usual and sterile fashion.    Following formal timeout, a digital tourniquet was applied to the right ring finger.  A fishmouth incision was designed over the PIP joint.  The volar soft tissue flap was designed longer than the dorsal flap as the volar soft tissues appeared much healthier.  Full-thickness skin flaps were elevated.  Blunt  dissection was used volarly to identify the digital neurovascular bundle.  A traction neurectomy was performed of both the radial and ulnar digital nerves.  The radial and ulnar digital arteries were coagulated with bipolar cautery.  The flexor tendon sheath was entered.  There was minimal fluid within the sheath.  The profundus tendon was pulled and transected.  The FDS tendon was transected at the level of the PIP joint.  I then turned my attention to the dorsum of the finger.  There was some purulent drainage from the distal aspect of the dorsal soft tissues consistent with an abscess.  The extensor apparatus was divided.  The finger was disarticulated.  Culture swabs were taken from the purulent appearing tissue in the distal finger.  The finger was passed off the back table for permanent pathology.  The head of the proximal phalanx was removed using a rongeur.  There was no purulent or necrotic appearing tissue in the remaining finger.  The finger was then thoroughly irrigated with copious sterile saline.  It was closed using a 4-0 nylon suture with advancement of the volar soft tissue flap over the distal end of the finger in simple interrupted fashion.  The tourniquet was removed.  The finger was dressed with Xeroform, 4 x 4's, and a 2 inch Ace wrap.  The patient was reversed from sedation.   They were transferred from the operating table to the postoperative bed.  All counts were correct x 2 at the end of the procedure.  The patient was then taken to the PACU in stable condition.   POSTOPERATIVE PLAN: Will be discharged home  with appropriate pain medication and discharge instructions.  I will discharge her on Augmentin and doxycycline as that was what she was discharged on at her most recent hospitalization for the same issue.  I will see her back next week for a wound check.  Waylan Rocher, MD 7:01 PM

## 2022-11-05 NOTE — Discharge Instructions (Signed)
  [name], M.D. Hand Surgery  POST-OPERATIVE DISCHARGE INSTRUCTIONS   PRESCRIPTIONS: You may have been given a prescription to be taken as directed for post-operative pain control.  You may also take over the counter ibuprofen/aleve and tylenol for pain. Take this as directed on the packaging. Do not exceed 3000 mg tylenol/acetaminophen in 24 hours.  Ibuprofen 600-800 mg (3-4) tablets by mouth every 6 hours as needed for pain.  OR Aleve 2 tablets by mouth every 12 hours (twice daily) as needed for pain.  AND/OR Tylenol 1000 mg (2 tablets) every 8 hours as needed for pain.  Please use your pain medication carefully, as refills are limited and you may not be provided with one.  As stated above, please use over the counter pain medicine - it will also be helpful with decreasing your swelling.    ANESTHESIA: After your surgery, post-surgical discomfort or pain is likely. This discomfort can last several days to a few weeks. At certain times of the day your discomfort may be more intense.   Did you receive a nerve block?  A nerve block can provide pain relief for one hour to two days after your surgery. As long as the nerve block is working, you will experience little or no sensation in the area the surgeon operated on.  As the nerve block wears off, you will begin to experience pain or discomfort. It is very important that you begin taking your prescribed pain medication before the nerve block fully wears off. Treating your pain at the first sign of the block wearing off will ensure your pain is better controlled and more tolerable when full-sensation returns. Do not wait until the pain is intolerable, as the medicine will be less effective. It is better to treat pain in advance than to try and catch up.   General Anesthesia:  If you did not receive a nerve block during your surgery, you will need to start taking your pain medication shortly after your surgery and should continue  to do so as prescribed by your surgeon.     ICE AND ELEVATION: You may use ice for the first 48-72 hours, but it is not critical.   Motion of your fingers is very important to decrease the swelling.  Elevation, as much as possible for the next 48 hours, is critical for decreasing swelling as well as for pain relief. Elevation means when you are seated or lying down, you hand should be at or above your heart. When walking, the hand needs to be at or above the level of your elbow.  If the bandage gets too tight, it may need to be loosened. Please contact our office and we will instruct you in how to do this.    SURGICAL BANDAGES:  Keep your dressing and/or splint clean and dry at all times.  Do not remove until you are seen again in the office.  If careful, you may place a plastic bag over your bandage and tape the end to shower, but be careful, do not get your bandages wet.     HAND THERAPY:  You may not need any. If you do, we will begin this at your follow up visit in the clinic.    ACTIVITY AND WORK: You are encouraged to move any fingers which are not in the bandage.  Light use of the fingers is allowed to assist the other hand with daily hygiene and eating, but strong gripping or lifting is often uncomfortable and   should be avoided.  You might miss a variable period of time from work and hopefully this issue has been discussed prior to surgery. You may not do any heavy work with your affected hand for about 2 weeks.    [facility] Second Floor, [address] [place], [place] [address] [phone]  

## 2022-11-06 ENCOUNTER — Encounter (HOSPITAL_COMMUNITY): Payer: Self-pay | Admitting: Orthopedic Surgery

## 2022-11-09 LAB — SURGICAL PATHOLOGY

## 2022-11-10 LAB — AEROBIC/ANAEROBIC CULTURE W GRAM STAIN (SURGICAL/DEEP WOUND)

## 2022-11-23 ENCOUNTER — Encounter (HOSPITAL_BASED_OUTPATIENT_CLINIC_OR_DEPARTMENT_OTHER): Payer: Commercial Managed Care - HMO | Attending: Internal Medicine | Admitting: Internal Medicine

## 2022-11-23 DIAGNOSIS — Z89021 Acquired absence of right finger(s): Secondary | ICD-10-CM | POA: Diagnosis not present

## 2022-11-23 DIAGNOSIS — E11622 Type 2 diabetes mellitus with other skin ulcer: Secondary | ICD-10-CM | POA: Insufficient documentation

## 2022-11-23 DIAGNOSIS — Z89512 Acquired absence of left leg below knee: Secondary | ICD-10-CM | POA: Insufficient documentation

## 2022-11-23 DIAGNOSIS — E11621 Type 2 diabetes mellitus with foot ulcer: Secondary | ICD-10-CM | POA: Diagnosis not present

## 2022-11-23 DIAGNOSIS — L97118 Non-pressure chronic ulcer of right thigh with other specified severity: Secondary | ICD-10-CM | POA: Diagnosis not present

## 2022-11-23 DIAGNOSIS — S68614A Complete traumatic transphalangeal amputation of right ring finger, initial encounter: Secondary | ICD-10-CM

## 2022-11-23 DIAGNOSIS — Z89022 Acquired absence of left finger(s): Secondary | ICD-10-CM | POA: Diagnosis not present

## 2022-11-23 DIAGNOSIS — L97319 Non-pressure chronic ulcer of right ankle with unspecified severity: Secondary | ICD-10-CM | POA: Insufficient documentation

## 2022-11-24 NOTE — Progress Notes (Signed)
[name], OKE Park ([id number]) [id number] Page 1 of 9 Visit Report for 11/23/2022 Allergy List Details Patient Name: Date of Service: [name], [name] 11/23/2022 9:30 A M Medical Record Number: [id number] Patient Account Number: [id number] Date of Birth/Sex: Treating RN: [birth year] (57 y.o. [name] Primary Care [name]: [name] Other Clinician: Referring [name]: Treating [name]/Extender: [name] in Treatment: 0 Allergies Active Allergies Rocephin Reaction: [facility] Severity: Severe Allergy Notes Electronic Signature(s) Signed: 11/23/2022 6:06:14 PM By: [name] Stall RN, BSN Entered By: [name] on 11/20/2022 15:33:18 -------------------------------------------------------------------------------- Arrival Information Details Patient Name: Date of Service: [name] NDRA Park. 11/23/2022 9:30 A M Medical Record Number: [id number] Patient Account Number: [id number] Date of Birth/Sex: Treating RN: 01-Jun-1965 (57 y.o. [name], [name] Primary Care [name]: [name] Other Clinician: Referring [name]: Treating [name]/Extender: [name] in Treatment: 0 Visit Information Patient Arrived: Ambulatory Arrival Time: 10:21 Accompanied By: self Transfer Assistance: None Secondary Verification Process Completed: Yes Patient Requires Transmission-Based Precautions: No Patient Has Alerts: No History Since Last Visit Added or deleted any medications: No Any new allergies or adverse reactions: No Had a fall or experienced change in activities of daily living that may affect risk of falls: No Signs or symptoms of abuse/neglect since last visito No Hospitalized since last visit: No Implantable device outside of the clinic excluding cellular tissue based products placed in the center since last visit: No Has Dressing in Place as Prescribed: Yes Electronic  Signature(s) Signed: 11/23/2022 6:06:14 PM By: [name] Stall RN, BSN Entered By: [name] on 11/23/2022 10:22:31 -------------------------------------------------------------------------------- Clinic Level of Care Assessment Details Patient Name: Date of Service: [name] NDRA Park. 11/23/2022 9:30 A M Medical Record Number: [id number] Patient Account Number: [id number] Date of Birth/Sex: Treating RN: 1965-11-27 (57 y.o. [name] Primary Care [name]: [name] Other Clinician: Referring [name]: Treating Lakashia Park/Extender: [name] in Treatment: 0 Clinic Level of Care Assessment Items TOOL 1 Quantity Score [name], [name] ([id number]) [id number] Page 2 of 9 X- 1 0 Use when EandM and Procedure is performed on INITIAL visit ASSESSMENTS - Nursing Assessment / Reassessment X- 1 20 General Physical Exam (combine w/ comprehensive assessment (listed just below) when performed on new pt. evals) X- 1 25 Comprehensive Assessment (HX, ROS, Risk Assessments, Wounds Hx, etc.) ASSESSMENTS - Wound and Skin Assessment / Reassessment []  - 0 Dermatologic / Skin Assessment (not related to wound area) ASSESSMENTS - Ostomy and/or Continence Assessment and Care []  - 0 Incontinence Assessment and Management []  - 0 Ostomy Care Assessment and Management (repouching, etc.) PROCESS - Coordination of Care []  - 0 Simple Patient / Family Education for ongoing care X- 1 20 Complex (extensive) Patient / Family Education for ongoing care X- 1 10 Staff obtains Chiropractor, Records, T Results / Process Orders est []  - 0 Staff telephones HHA, Nursing Homes / Clarify orders / etc []  - 0 Routine Transfer to another Facility (non-emergent condition) []  - 0 Routine Hospital Admission (non-emergent condition) X- 1 15 New Admissions / Manufacturing engineer / Ordering NPWT Apligraf, etc. , []  - 0 Emergency Hospital Admission  (emergent condition) PROCESS - Special Needs []  - 0 Pediatric / [place] Patient Management []  - 0 Isolation Patient Management []  - 0 Hearing / Language / Visual special needs []  - 0 Assessment of Community assistance (transportation, D/C planning, etc.) []  - 0 Additional assistance / Altered mentation []  - 0 Support Surface(s) Assessment (bed, cushion, seat, etc.) INTERVENTIONS -  Miscellaneous []  - 0 External ear exam []  - 0 Patient Transfer (multiple staff / Nurse, adult / Similar devices) []  - 0 Simple Staple / Suture removal (25 or less) []  - 0 Complex Staple / Suture removal (26 or more) []  - 0 Hypo/Hyperglycemic Management (do not check if billed separately) X- 1 15 Ankle / Brachial Index (ABI) - do not check if billed separately Has the patient been seen at the hospital within the last three years: Yes Total Score: 105 Level Of Care: New/Established - Level 3 Electronic Signature(s) Signed: 11/23/2022 6:06:14 PM By: [name] Stall RN, BSN Entered By: [name] on 11/23/2022 11:36:45 -------------------------------------------------------------------------------- Encounter Discharge Information Details Patient Name: Date of Service: [name] NDRA Park. 11/23/2022 9:30 A M Medical Record Number: [id number] Patient Account Number: [id number] Date of Birth/Sex: Treating RN: December 07, 1965 (57 y.o. [name] Primary Care [name]: [name] Other Clinician: Referring [name]: Treating [name]/Extender: [name] in Treatment: 0 Encounter Discharge Information Items Post Procedure Vitals Discharge Condition: Stable Temperature (F): 98.4 Ambulatory Status: Ambulatory Pulse (bpm): 80 [name] ([id number]) [id number] Page 3 of 9 Discharge Destination: Home Respiratory Rate (breaths/min): 20 Transportation: Private Auto Blood Pressure (mmHg): 119/73 Accompanied By: self Schedule Follow-up  Appointment: Yes Clinical Summary of Care: Electronic Signature(s) Signed: 11/23/2022 6:06:14 PM By: [name] Stall RN, BSN Entered By: [name] on 11/23/2022 11:38:33 -------------------------------------------------------------------------------- Lower Extremity Assessment Details Patient Name: Date of Service: [name] NDRA Park. 11/23/2022 9:30 A M Medical Record Number: [id number] Patient Account Number: [id number] Date of Birth/Sex: Treating RN: 1966-02-16 (57 y.o. [name], [name] Primary Care Dhiren Azimi: [name] Other Clinician: Referring Damean Poffenberger: Treating Nahuel Wilbert/Extender: [name] in Treatment: 0 Edema Assessment Assessed: [name]: Yes] [name]: No] Edema: [Left: Ye] [Right: s] Calf Left: Right: Point of Measurement: 34.5 cm From Medial Instep 41.5 cm Ankle Left: Right: Point of Measurement: 12.5 cm From Medial Instep 26 cm Knee To Floor Left: Right: From Medial Instep 44.5 cm Vascular Assessment Pulses: Dorsalis Pedis Palpable: [Left:Yes] Doppler Audible: [Left:Yes] Posterior Tibial Palpable: [Left:Yes] Doppler Audible: [Left:Yes] Extremity colors, hair growth, and conditions: Extremity Color: [Left:Normal] Hair Growth on Extremity: [Left:No] Temperature of Extremity: [Left:Warm] Capillary Refill: [Left:< 3 seconds] Dependent Rubor: [Left:No] Blanched when Elevated: [Left:No] Lipodermatosclerosis: [Left:No] Blood Pressure: Brachial: [Left:119] Ankle: [Left:Dorsalis Pedis: 120 1.01] Toe Nail Assessment Left: Right: Thick: Yes Discolored: Yes Deformed: Yes Improper Length and Hygiene: No Electronic Signature(s) Signed: 11/23/2022 6:06:14 PM By: [name] Stall RN, BSN Entered By: [name] on 11/23/2022 10:35:23 Waters, Devi Park ([id number]) [id number] Page 4 of 9 -------------------------------------------------------------------------------- Multi Wound Chart Details Patient Name:  Date of Service: [name] NDRA Park. 11/23/2022 9:30 A M Medical Record Number: [id number] Patient Account Number: [id number] Date of Birth/Sex: Treating RN: 19-Aug-1965 (57 y.o. 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 Etiology: Extremity Anemia, Sleep Apnea, Type II Comorbid History: Diabetes, Gout, Osteoarthritis, Osteomyelitis, Neuropathy 07/27/2022 Date  Acquired: 0 Weeks of Treatment: Open Wound Status: No Wound Recurrence: Yes Pending A mputation on Presentation: 2.3x1.6x0.2 Measurements L x W x D (cm) 2.89 A (cm) : rea 0.578 Volume (cm) : Grade 1 Classification: Medium Exudate A  mount: Serosanguineous  Exudate Type: red, brown Exudate Color: Distinct, outline attached Wound Margin: Small (1-33%) Granulation A mount: Pink Granulation Quality: Large (67-100%) Necrotic A mount: Fat Layer (Subcutaneous Tissue): Yes N/A Exposed Structures: Fascia: No  Tendon: No Muscle: No Joint: No Bone: No None Epithelialization: Chemical/Enzymatic/Mechanical Debridement: N/A Instrument: None Bleeding: Debridement Treatment Response: Procedure was tolerated well Post Debridement Measurements L x 2.3x1.6x0.2 W x D  (cm) 0.578 Post Debridement Volume: (cm) Excoriation: No Periwound Skin Texture: Induration: No Callus: No Crepitus: No Rash: No Scarring: No Maceration: No Periwound Skin Moisture: Dry/Scaly: No Erythema: Yes Periwound Skin Color: Atrophie Blanche: No  Cyanosis: No Ecchymosis: No Hemosiderin Staining: No Mottled: No Pallor: No Rubor: No Circumferential Erythema  Location: Debridement Procedures Performed:] [N/A:N/A N/A N/A N/A N/A N/A N/A N/A N/A N/A N/A N/A N/A N/A N/A N/A N/A N/A N/A N/A N/A N/A N/A  N/A N/A N/A N/A N/A N/A N/A N/A N/A N/A] Treatment Notes Wound #4 (Ankle) Wound Laterality: Right, Lateral Campion, Donnell Park ([id number]) [id number] Page 5 of 9 Cleanser Vashe 5.8 (oz) Discharge Instruction: Cleanse the wound with Vashe prior to applying a clean dressing using gauze sponges, not tissue or cotton balls. Peri-Wound Care Topical Primary Dressing Santyl Ointment Discharge Instruction: Apply nickel thick amount to wound bed as instructed Secondary Dressing Woven Gauze Sponges 2x2 in Discharge Instruction: Apply over primary dressing as directed. Zetuvit Plus Silicone Border Dressing 4x4 (in/in) Discharge Instruction: Apply silicone border over primary dressing as directed. Secured With Compression Wrap tubigrip size D one layer Discharge Instruction: apply in the morning and remove at night. Compression Stockings Add-Ons Electronic Signature(s) Signed: 11/23/2022 5:19:21 PM By: Geralyn Corwin DO Entered By: Geralyn Corwin on 11/23/2022 13:26:17 -------------------------------------------------------------------------------- Multi-Disciplinary Care Plan Details Patient Name: Date of Service: [name] NDRA Park. 11/23/2022 9:30 A M Medical Record Number: [id number] Patient Account Number: [id number] Date of Birth/Sex: Treating RN: 1965/05/19 (57 y.o. [name] Primary Care Sia Gabrielsen: [name] Other Clinician: Referring Blade Scheff: Treating Sherrye Puga/Extender: [name] in Treatment: 0 Active Inactive Necrotic Tissue Nursing Diagnoses: Knowledge deficit related to management of necrotic/devitalized tissue Goals: Necrotic/devitalized tissue will be minimized in the wound bed Date Initiated: 11/23/2022 Target Resolution Date: 12/04/2022 Goal Status:  Active Patient/caregiver will verbalize understanding of reason and process for debridement of necrotic tissue Date Initiated: 11/23/2022 Target Resolution Date: 12/04/2022 Goal Status: Active Interventions: Assess patient pain level pre-, during and post procedure and prior to discharge Provide education on necrotic tissue and debridement process Treatment Activities: Biologic debridement : 11/23/2022 Excisional debridement : 11/23/2022 Notes: Nutrition Nursing Diagnoses: [name], [name] ([id number]) [id number] Page 6 of 9 Potential for alteratiion in Nutrition/Potential for imbalanced nutrition Goals: Patient/caregiver agrees to and verbalizes understanding of need to obtain nutritional consultation Date Initiated: 11/23/2022 Target Resolution Date: 12/04/2022 Goal Status: Active Patient/caregiver will maintain therapeutic glucose control Date Initiated: 11/23/2022 Target Resolution Date: 12/04/2022 Goal Status: Active Interventions: Assess HgA1c results as ordered upon admission and as needed Provide education on elevated blood sugars and impact on wound healing Provide education on nutrition Treatment Activities: Obtain HgA1c : 11/23/2022 Patient referred to Primary Care Physician for further nutritional evaluation : 11/23/2022 Notes: Orientation to the Wound Care Program Nursing Diagnoses: Knowledge deficit related to the wound healing center program Goals: Patient/caregiver will verbalize understanding of the Wound Healing Center Program Date Initiated: 11/23/2022 Target Resolution Date: 12/04/2022 Goal Status: Active Interventions: Provide education on orientation to the wound center Notes: Pain, Acute or Chronic Nursing  Diagnoses: Potential alteration in comfort, pain Goals: Patient will verbalize adequate pain control and receive pain control interventions during procedures as needed Date Initiated: 11/23/2022 Target Resolution Date:  12/04/2022 Goal Status: Active Patient/caregiver will verbalize comfort level met Date Initiated: 11/23/2022 Target Resolution Date: 12/04/2022 Goal Status: Active Interventions: Encourage patient to take pain medications as prescribed Provide education on pain management Reposition patient for comfort Treatment Activities: Administer pain control measures as ordered : 11/23/2022 Notes: Wound/Skin Impairment Nursing Diagnoses: Knowledge deficit related to ulceration/compromised skin integrity Goals: Ulcer/skin breakdown will heal within 14 weeks Date Initiated: 11/23/2022 Target Resolution Date: 02/18/2023 Goal Status: Active Interventions: Assess patient/caregiver ability to perform ulcer/skin care regimen upon admission and as needed Assess ulceration(s) every visit Provide education on ulcer and skin care Treatment Activities: Skin care regimen initiated : 11/23/2022 Topical wound management initiated : 11/23/2022 Notes: ISRAELLE, [name] ([id number]) [id number] Page 7 of 9 Electronic Signature(s) Signed: 11/23/2022 6:06:14 PM By: [name] Stall RN, BSN Entered By: [name] on 11/23/2022 11:10:25 -------------------------------------------------------------------------------- Pain Assessment Details Patient Name: Date of Service: [name] NDRA Park. 11/23/2022 9:30 A M Medical Record Number: [id number] Patient Account Number: [id number] Date of Birth/Sex: Treating RN: 1965/11/12 (57 y.o. [name] Primary Care Zarian Colpitts: [name] Other Clinician: Referring Lashaye Fisk: Treating Ruford Dudzinski/Extender: [name] in Treatment: 0 Active Problems Location of Pain Severity and Description of Pain Patient Has Paino Yes Site Locations Pain Location: Pain in Ulcers Rate the pain. Current Pain Level: 4 Character of Pain Describe the Pain: Throbbing Pain Management and Medication Current Pain  Management: Medication: No Cold Application: No Rest: No Massage: No Activity: No T.E.N.S.: No Heat Application: No Leg drop or elevation: No Is the Current Pain Management Adequate: Adequate How does your wound impact your activities of daily livingo Sleep: No Bathing: No Appetite: No Relationship With Others: No Bladder Continence: No Emotions: No Bowel Continence: No Work: No Toileting: No Drive: No Dressing: No Hobbies: No Psychologist, prison and probation services) Signed: 11/23/2022 6:06:14 PM By: [name] Stall RN, BSN Entered By: [name] on 11/23/2022 10:22:56 -------------------------------------------------------------------------------- Patient/Caregiver Education Details Patient Name: Date of Service: [name] NDRA Park. 7/29/2024andnbsp9:30 A M Medical Record Number: [id number] Patient Account Number: [id number] Date of Birth/Gender: Treating RN: [birth year] (57 y.o. [name] Primary Care Physician: [name] Other Clinician: Referring Physician: Treating Physician/Extender: Malikia, Bergs Park ([id number]) [id number] Page 8 of 9 Weeks in Treatment: 0 Education Assessment Education Provided To: Patient Education Topics Provided Wound/Skin Impairment: Handouts: Caring for Your Ulcer Methods: Explain/Verbal Responses: Reinforcements needed Electronic Signature(s) Signed: 11/23/2022 6:06:14 PM By: [name] Stall RN, BSN Entered By: [name] on 11/23/2022 11:10:35 -------------------------------------------------------------------------------- Wound Assessment Details Patient Name: Date of Service: [name] NDRA Park. 11/23/2022 9:30 A M Medical Record Number: [id number] Patient Account Number: [id number] Date of Birth/Sex: Treating RN: [birth year] (57 y.o. [name], [name] Primary Care Cono Gebhard: [name] Other Clinician: Referring Corynne Scibilia: Treating Reichen Hutzler/Extender: [name] in Treatment: 0 Wound Status Wound Number: 4 Primary Diabetic Wound/Ulcer of the Lower Extremity Etiology: Wound Location: Right, Lateral Ankle Wound Open Wounding Event: Trauma Status: Date Acquired: 07/27/2022 Comorbid Anemia, Sleep Apnea, Type II Diabetes, Gout, Osteoarthritis, Weeks Of Treatment: 0 History: Osteomyelitis, Neuropathy Clustered Wound: No Photos Wound Measurements Length: (cm) 2.3 Width: (cm) 1.6 Depth: (cm) 0.2 Area: (cm) 2.89 Volume: (cm) 0.578 % Reduction in Area: % Reduction in Volume: Epithelialization: None Tunneling: No Undermining: No Wound Description  Classification: Grade 1 Wound Margin: Distinct, outline attached Exudate Amount: Medium Exudate Type: Serosanguineous Exudate Color: red, brown Foul Odor After Cleansing: No Slough/Fibrino Yes Wound Bed Granulation Amount: Small (1-33%) Exposed Structure Granulation Quality: Pink Fascia Exposed: No Necrotic Amount: Large (67-100%) Fat Layer (Subcutaneous Tissue) Exposed: Yes Necrotic Quality: Adherent Slough Tendon Exposed: No Muscle Exposed: No Joint Exposed: No Bone Exposed: No Periwound Skin Texture Texture Color No Abnormalities Noted: No No Abnormalities Noted: No Callus: No Atrophie Blanche: No Crepitus: No Cyanosis: No Excoriation: No Ecchymosis: No Induration: No Erythema: Yes [name], [name] ([id number]) [id number] Page 9 of 9 Rash: No Erythema Location: Circumferential Scarring: No Hemosiderin Staining: No Mottled: No Moisture Pallor: No No Abnormalities Noted: No Rubor: No Dry / Scaly: No Maceration: No Electronic Signature(s) Signed: 11/23/2022 5:33:27 PM By: Redmond Pulling RN, BSN Signed: 11/23/2022 6:06:14 PM By: [name] Stall RN, BSN Entered By: Redmond Pulling on 11/23/2022 10:38:27 -------------------------------------------------------------------------------- Vitals Details Patient Name: Date of  Service: [name] NDRA Park. 11/23/2022 9:30 A M Medical Record Number: [id number] Patient Account Number: [id number] Date of Birth/Sex: Treating RN: Sep 16, 1965 (57 y.o. [name], [name] Primary Care Deangleo Passage: [name] Other Clinician: Referring Khriz Liddy: Treating Yerachmiel Spinney/Extender: [name] in Treatment: 0 Vital Signs Time Taken: 10:23 Temperature (F): 98.4 Height (in): 64 Pulse (bpm): 80 Source: Stated Respiratory Rate (breaths/min): 20 Weight (lbs): 261 Blood Pressure (mmHg): 119/73 Source: Stated Capillary Blood Glucose (mg/dl): 784 Body Mass Index (BMI): 44.8 Reference Range: 80 - 120 mg / dl Electronic Signature(s) Signed: 11/23/2022 6:06:14 PM By: [name] Stall RN, BSN Entered By: [name] on 11/23/2022 10:36:31

## 2022-11-24 NOTE — Progress Notes (Signed)
TRAVONNA, KOSOWSKI Park ([id number]) [id number] Page 1 of 9 Visit Report for 11/23/2022 Chief Complaint Document Details Patient Name: Date of Service: [name] Draft NDRA Park. 11/23/2022 9:30 A M Medical Record Number: [id number] Patient Account Number: [id number] Date of Birth/Sex: Treating RN: Jun 23, 1965 (57 y.o. F) Primary Care Provider: Antony Haste Other Clinician: Referring Provider: Treating Provider/Extender: Avelina Laine in Treatment: 0 Information Obtained from: Patient Chief Complaint 11/23/2022; right lateral ankle wound Electronic Signature(s) Signed: 11/23/2022 5:19:21 PM By: Geralyn Corwin DO Entered By: Geralyn Corwin on 11/23/2022 13:26:40 -------------------------------------------------------------------------------- Debridement Details Patient Name: Date of Service: [name] Draft NDRA Park. 11/23/2022 9:30 A M Medical Record Number: [id number] Patient Account Number: [id number] Date of Birth/Sex: Treating RN: Jun 26, 1965 (57 y.o. [name] Primary Care Provider: Antony Haste Other Clinician: Referring Provider: Treating Provider/Extender: Avelina Laine in Treatment: 0 Debridement Performed for Assessment: Wound #4 Right,Lateral Ankle Performed By: Clinician Shawn Stall, RN Debridement Type: Chemical/Enzymatic/Mechanical Agent Used: Santyl Severity of Tissue Pre Debridement: Fat layer exposed Level of Consciousness (Pre-procedure): Awake and Alert Pre-procedure Verification/Time Out No Taken: Percent of Wound Bed Debrided: Bleeding: None Response to Treatment: Procedure was tolerated well Level of Consciousness (Post- Awake and Alert procedure): Post Debridement Measurements of Total Wound Length: (cm) 2.3 Width: (cm) 1.6 Depth: (cm) 0.2 Volume: (cm) 0.578 Character of Wound/Ulcer Post Debridement: Requires Further Debridement Severity of Tissue Post Debridement: Fat  layer exposed Post Procedure Diagnosis Same as Pre-procedure Electronic Signature(s) Signed: 11/23/2022 5:19:21 PM By: Geralyn Corwin DO Signed: 11/23/2022 6:06:14 PM By: Shawn Stall RN, BSN Entered By: Shawn Stall on 11/23/2022 11:09:15 -------------------------------------------------------------------------------- HPI Details Patient Name: Date of Service: [name] Draft NDRA Park. 11/23/2022 9:30 A M Medical Record Number: [id number] Patient Account Number: [id number] Date of Birth/Sex: Treating RN: 20-Aug-1965 (57 y.o. [name], [name] ([id number]) [id number] Page 2 of 9 Primary Care Provider: Antony Haste Other Clinician: Referring Provider: Treating Provider/Extender: Avelina Laine in Treatment: 0 History of Present Illness HPI Description: ABI on Right: 1.01 Ms. 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 Most recently she had an amputation of the right ring finger. She presents today with a wound to the right lateral ankle that she has had for 3 months that was caused by her hitting the area with her wheelchair. She has been Vashe dressings to clean the wound bed followed by antibiotic ointment. ABIs in office are 1.01 on the left and she has palpable dorsalis pedis and posterior tibial artery pulses. Her foot is warm and well-perfused. 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 She has been cleaning the area with Vashe and using Neosporin to the wound bed. She currently denies signs of infection. Patient has a history of ankle surgery on 04-07-22 with right partial hardware removal and revision subtalar fusion. Electronic Signature(s) Signed: 11/23/2022 5:19:21 PM By: Geralyn Corwin DO Entered By: Geralyn Corwin on 11/23/2022  14:06:02 -------------------------------------------------------------------------------- Physical Exam Details Patient Name: Date of Service: [name] Draft NDRA Park. 11/23/2022 9:30 A M Medical Record Number: [id number] Patient Account Number: [id number] Date of Birth/Sex: Treating RN: Jun 02, 1965 (57 y.o. F) Primary Care Provider: Antony Haste Other Clinician: Referring Provider: Treating Provider/Extender: Avelina Laine in Treatment: 0 Constitutional respirations regular, non-labored and within target range for patient.. Cardiovascular  2+ dorsalis pedis/posterior tibialis pulses. Psychiatric pleasant and cooperative. Notes Right foot: T the lateral ankle there is an open wound with granulation tissue and nonviable tissue at the wound bed. T the periwound there is mild erythema o o and increased warmth. No purulent drainage. 2+ pitting edema to the knee. Electronic Signature(s) Signed: 11/23/2022 5:19:21 PM By: Geralyn Corwin DO Entered By: Geralyn Corwin on 11/23/2022 14:07:44 -------------------------------------------------------------------------------- Physician Orders Details Patient Name: Date of Service: [name] Draft NDRA Park. 11/23/2022 9:30 A M Medical Record Number: [id number] Patient Account Number: [id number] Date of Birth/Sex: Treating RN: 03-31-1966 (57 y.o. 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 - Dr. Mikey Bussing  Tuesday 3pm 12/01/2022 Return A Schoenfeldt, Kamyiah Park ([id number]) [id number] Page 3 of 9 Room 8 ppointment in 2 weeks. - Dr. Mikey Bussing Tuesday 3pm 12/08/2022 Return A room 8 Other: - Oral antibiotics from your pharmacy. Sending Santyl order to [facility]- will call you with pricing, too expensive then let wound center know, will ship to your house. [facility] wound care supplies. Anesthetic (In clinic) Topical Lidocaine 4% applied to wound bed Bathing/ Shower/ Hygiene May shower with protection but do not get wound dressing(s) wet. Protect dressing(s) with water repellant cover (for example, large plastic bag) or a cast cover and may then take shower. Edema Control - Lymphedema / SCD / Other Elevate legs to the level of the heart or above for 30 minutes daily and/or when sitting for 3-4 times a day throughout the day. Avoid standing for long periods of time. Off-Loading Other: - bunny boot- purchase off of [employer] to protect ankle while sleeping at time. Wound Treatment Wound #4 - Ankle Wound Laterality: Right, Lateral Cleanser: Vashe 5.8 (oz) 1 x Per Day/30 Days Discharge Instructions: Cleanse the wound with Vashe prior to applying a clean dressing using gauze sponges, not tissue or cotton balls. Prim Dressing: Santyl Ointment 1 x Per Day/30 Days ary Discharge Instructions: Apply nickel thick amount to wound bed as instructed Secondary Dressing: Woven Gauze Sponges 2x2 in (DME) (Generic) 1 x Per Day/30 Days Discharge Instructions: Apply over primary dressing as directed. Secondary Dressing: Zetuvit Plus Silicone Border Dressing 4x4 (in/in) (DME) (Generic) 1 x Per Day/30 Days Discharge Instructions: Apply silicone border over primary dressing as directed. Compression Wrap: tubigrip size D one layer 1 x Per Day/30 Days Discharge Instructions: apply in the morning and remove at night. Radiology MRI, right ankle with and without contrast - MRI, right ankle with and  without contrast due to non healing diabetic ulcer looking for infection. Hardware in right ankle. - (ICD10 E11.621 - Type 2 diabetes mellitus with foot ulcer) Patient Medications llergies: Rocephin A Notifications Medication Indication Start End applied only in clinic for7/29/2024 lidocaine debridements. DOSE topical 4 % cream - cream topical once daily 11/23/2022 amoxicillin-pot clavulanate DOSE 1 - oral 875 mg-125 mg tablet - 1 tablet oral twice a day x 10 days 11/23/2022 doxycycline hyclate DOSE 1 - oral 100 mg tablet - 1 tablet oral twice a day x 10 days 11/23/2022 Santyl DOSE 1 - topical 250 unit/gram ointment - Apply once  daily to the wound bed Electronic Signature(s) Signed: 11/23/2022 5:19:21 PM By: Geralyn Corwin DO Previous Signature: 11/23/2022 11:16:27 AM Version By: Geralyn Corwin DO Previous Signature: 11/23/2022 11:13:26 AM Version By: Geralyn Corwin DO Entered By: Geralyn Corwin on 11/23/2022 14:08:01 Prescription 11/23/2022 -------------------------------------------------------------------------------- Kaczorowski, [place]. Geralyn Corwin DO Patient Name: Provider: January 22, 1966 [id number] Date of Birth: NPI#: [id number] Sex: DEA #: [phone] Phone #: License #: [employer] Signs: Patient Address: [address] Eligha Bridegroom [facility] [place], Ethel Park ([id number]) [id number] Page 4 of 9 [place], [place] [address] [address] [place], [place] [address] [phone] Allergies Rocephin Provider's Orders MRI, right ankle with and without contrast - ICD10: E11.621 - MRI, right ankle with and without contrast due to non healing diabetic ulcer looking for infection. Hardware in right ankle. Hand Signature: Date(s): Electronic Signature(s) Signed: 11/23/2022 5:19:21 PM By: Geralyn Corwin DO Entered By: Geralyn Corwin on 11/23/2022  14:08:01 -------------------------------------------------------------------------------- Problem List Details Patient Name: Date of Service: [name] Draft NDRA Park. 11/23/2022 9:30 A M Medical Record Number: [id number] Patient Account Number: [id number] Date of Birth/Sex: Treating RN: Jul 27, 1965 (57 y.o. 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 11/23/2022 9:30 A M Medical Record Number: [id number] Patient Account Number: [id number] Date of Birth/Sex: Treating RN: 1965/05/03 (57 y.o. F) Primary Care Provider: Antony Haste Other Clinician: TYLIAH, DENHOLM ([id number]) [id number] Page 5 of 9 Referring Provider: Treating Provider/Extender: Avelina Laine in Treatment: 0 Subjective Chief Complaint Information obtained from Patient 11/23/2022; right lateral ankle wound History of Present Illness (HPI) ABI on Right: 1.01 Ms. 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 Most recently she had an amputation of the right ring finger. She presents today with a wound to the right lateral ankle that she has had for 3 months that was caused by her hitting the area with her wheelchair. She has been Vashe dressings to clean the wound bed followed by antibiotic ointment. ABIs in office are 1.01 on the left and she has palpable dorsalis pedis and posterior tibial artery pulses. Her foot is warm and well-perfused. 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 She has been cleaning the area with Vashe and using Neosporin to the wound bed. She currently denies signs of infection. Patient has a history of ankle surgery on 04-07-22 with right partial hardware removal and revision subtalar fusion. Patient History Allergies Rocephin (Severity: Severe, Reaction: SHOB) Family History Cancer - Mother, Diabetes - Mother, Stroke - Father, No family history of Heart Disease, Hereditary Spherocytosis, Hypertension, Kidney Disease, Lung Disease, Seizures, Thyroid Problems, Tuberculosis. Social History Never smoker, Marital Status - Married, Alcohol Use - Rarely, Drug  Use - No History, Caffeine Use - Rarely - coke. Medical History Eyes Denies history of Cataracts, Glaucoma, Optic Neuritis Ear/Nose/Mouth/Throat Denies history of Chronic sinus problems/congestion, Middle ear problems Hematologic/Lymphatic Patient has history of Anemia Denies history of Hemophilia, Human Immunodeficiency Virus, Lymphedema, Sickle Cell Disease Respiratory Patient has history of Sleep Apnea Denies history of Aspiration, Asthma, Chronic Obstructive Pulmonary Disease (COPD), Pneumothorax, Tuberculosis Cardiovascular Denies history of Angina, Arrhythmia, Congestive Heart Failure, Coronary Artery Disease, Deep Vein Thrombosis, Hypertension, Hypotension, Myocardial Infarction, Peripheral Arterial Disease, Peripheral Venous Disease, Phlebitis,  Vasculitis Gastrointestinal Denies history of Cirrhosis , Colitis, Crohns, Hepatitis A, Hepatitis B, Hepatitis C Endocrine Patient has history of Type II Diabetes - 3/24 HgbA1c 12.2 Denies history of Type I Diabetes Genitourinary Denies history of End Stage Renal Disease Immunological Denies history of Lupus Erythematosus, Raynauds, Scleroderma Integumentary (Skin) Denies history of History of Burn Musculoskeletal Patient has history of Gout, Osteoarthritis, Osteomyelitis - fingers Denies history of Rheumatoid Arthritis Neurologic Patient has history of Neuropathy Denies history of Dementia, Quadriplegia, Paraplegia, Seizure Disorder Oncologic Denies history of Received Chemotherapy, Received Radiation Psychiatric Denies history of Anorexia/bulimia, Confinement Anxiety Patient is treated with Controlled Diet, Insulin. Blood sugar is tested. Hospitalization/Surgery History - left ankle reconstruction. - left ankle reconstruction. - left foot surgery (charcots). - right knee septic. - right knee replacement. - left knee replacement. - 11/05/2022 right ring finger amputation. - 07/02/2022 left middle finger amputation. - 08/03/2020 IandD abscess. - 09/2017 L BKA. - 2/19 right finger amputation. - right ankle reconstruction x3 surgeries 12/23. Medical A Surgical History Notes nd Respiratory acute bacterial bronchitis 05/16/2022 RSV Gastrointestinal GERD Musculoskeletal Charcot Joint right foot Mackowski, Azula Park ([id number]) [id number] Page 6 of 9 Objective Constitutional respirations regular, non-labored and within target range for patient.. Vitals Time Taken: 10:23 AM, Height: 64 in, Source: Stated, Weight: 261 lbs, Source: Stated, BMI: 44.8, Temperature: 98.4 F, Pulse: 80 bpm, Respiratory Rate: 20 breaths/min, Blood Pressure: 119/73 mmHg, Capillary Blood Glucose: 147 mg/dl. Cardiovascular 2+ dorsalis pedis/posterior tibialis pulses. Psychiatric pleasant  and cooperative. General Notes: Right foot: T the lateral ankle there is an open wound with granulation tissue and nonviable tissue at the wound bed. T the periwound there is o o mild erythema and increased warmth. No purulent drainage. 2+ pitting edema to the knee. Integumentary (Hair, Skin) Wound #4 status is Open. Original cause of wound was Trauma. The date acquired was: 07/27/2022. The wound is located on the Right,Lateral Ankle. The wound measures 2.3cm length x 1.6cm width x 0.2cm depth; 2.89cm^2 area and 0.578cm^3 volume. There is Fat Layer (Subcutaneous Tissue) exposed. There is no tunneling or undermining noted. There is a medium amount of serosanguineous drainage noted. The wound margin is distinct with the outline attached to the wound base. There is small (1-33%) pink granulation within the wound bed. There is a large (67-100%) amount of necrotic tissue within the wound bed including Adherent Slough. The periwound skin appearance exhibited: Erythema. The periwound skin appearance did not exhibit: Callus, Crepitus, Excoriation, Induration, Rash, Scarring, Dry/Scaly, Maceration, Atrophie Blanche, Cyanosis, Ecchymosis, Hemosiderin Staining, Mottled, Pallor, Rubor. The surrounding wound skin color is noted with erythema which is circumferential. 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 The periwound has erythema and increased warmth and I recommended an antibiotic as she is high risk for infection and thus further amputation. Will prescribe Augmentin and doxycycline for 2 weeks. She has had this regimen  before with no issues. Also recommended an MRI for further evaluation of any osseous abnormalities. For dressing changes I recommended cleaning the area with Vashe and using Santyl daily. Also discussed the importance of aggressive offloading. She does not wear shoes that cover this area. I recommended bunny boots at night. ABIs in office were 1.01 on the right with palpable dorsalis and posterior tibialis pulses. She should have adequate blood flow for healing. Follow-up in 1 week. Procedures Wound #4 Pre-procedure diagnosis of Wound #4 is a Diabetic Wound/Ulcer of the Lower Extremity located on the Right,Lateral Ankle .Severity of Tissue Pre Debridement is: Fat layer exposed. There was a Chemical/Enzymatic/Mechanical debridement performed by Shawn Stall, RN.Marland Kitchen Agent used was [employer]. There was no bleeding. The procedure was tolerated well. Post Debridement Measurements: 2.3cm length x 1.6cm width x 0.2cm depth; 0.578cm^3 volume. Character of Wound/Ulcer Post Debridement requires further debridement. Severity of Tissue Post Debridement is: Fat layer exposed. Post procedure Diagnosis Wound #4: Same as Pre-Procedure Plan Follow-up Appointments: Return Appointment in 1 week. - Dr. Mikey Bussing Tuesday 3pm 12/01/2022 Room 8 Return Appointment in 2 weeks. - Dr. Mikey Bussing Tuesday 3pm 12/08/2022 room 8 Other: - Oral antibiotics from your pharmacy. Sending Santyl order to [facility]- will call you with pricing, too expensive then let wound center know, will ship to your house. [facility] wound care supplies. Anesthetic: (In clinic) Topical Lidocaine 4% applied to wound bed Bathing/ Shower/ Hygiene: May shower with protection but do not get wound dressing(s) wet. Protect dressing(s) with water repellant cover (for example, large plastic bag) or a cast cover and may then take shower. Edema Control - Lymphedema / SCD / Other: Elevate legs to the level of the heart or above for 30 minutes daily and/or when sitting for  3-4 times a day throughout the day. Avoid standing for long periods of time. Off-Loading: Other: - bunny boot- purchase off of [employer] to protect ankle while sleeping at time. TERECIA, SLOANE Park ([id number]) [id number] Page 7 of 9 Radiology ordered were: MRI, right ankle with and without contrast - MRI, right ankle with and without contrast due to non healing diabetic ulcer looking for infection. Hardware in right ankle. The following medication(s) was prescribed: lidocaine topical 4 % cream cream topical once daily for applied only in clinic for debridements. was prescribed at facility amoxicillin-pot clavulanate oral 875 mg-125 mg tablet 1 1 tablet oral twice a day x 10 days starting 11/23/2022 doxycycline hyclate oral 100 mg tablet 1 1 tablet oral twice a day x 10 days starting 11/23/2022 Santyl topical 250 unit/gram ointment 1 Apply once daily to the wound bed starting 11/23/2022 WOUND #4: - Ankle Wound Laterality: Right, Lateral Cleanser: Vashe 5.8 (oz) 1 x Per Day/30 Days Discharge Instructions: Cleanse the wound with Vashe prior to applying a clean dressing using gauze sponges, not tissue or cotton balls. Prim Dressing: Santyl Ointment 1 x Per Day/30 Days ary Discharge Instructions: Apply nickel thick amount to wound bed as instructed Secondary Dressing: Woven Gauze Sponges 2x2 in (DME) (Generic) 1 x Per Day/30 Days Discharge Instructions: Apply over primary dressing as directed. Secondary Dressing: Zetuvit Plus Silicone Border Dressing 4x4 (in/in) (DME) (Generic) 1 x Per Day/30 Days Discharge Instructions: Apply silicone border over primary dressing as directed. Com pression Wrap:  tubigrip size D one layer 1 x Per Day/30 Days Discharge Instructions: apply in the morning and remove at night. 1. Vashe 2. Santyl 3. Aggressive offloadingbunny boots 4. Oral antibiotics including doxycycline and Augmentin for 2 weeks 5. MRI of the right ankle 6. Follow-up in 1  week Electronic Signature(s) Signed: 11/23/2022 5:19:21 PM By: Geralyn Corwin DO Entered By: Geralyn Corwin on 11/23/2022 14:26:37 -------------------------------------------------------------------------------- HxROS Details Patient Name: Date of Service: [name] Draft NDRA Park. 11/23/2022 9:30 A M Medical Record Number: [id number] Patient Account Number: [id number] Date of Birth/Sex: Treating RN: 05-13-1965 (57 y.o. [name] Primary Care Provider: Antony Haste Other Clinician: Referring Provider: Treating Provider/Extender: Avelina Laine in Treatment: 0 Eyes Medical History: Negative for: Cataracts; Glaucoma; Optic Neuritis Ear/Nose/Mouth/Throat Medical History: Negative for: Chronic sinus problems/congestion; Middle ear problems Hematologic/Lymphatic Medical History: Positive for: Anemia Negative for: Hemophilia; Human Immunodeficiency Virus; Lymphedema; Sickle Cell Disease Respiratory Medical History: Positive for: Sleep Apnea Negative for: Aspiration; Asthma; Chronic Obstructive Pulmonary Disease (COPD); Pneumothorax; Tuberculosis Past Medical History Notes: acute bacterial bronchitis 05/16/2022 RSV Cardiovascular Medical History: Negative for: Angina; Arrhythmia; Congestive Heart Failure; Coronary Artery Disease; Deep Vein Thrombosis; Hypertension; Hypotension; Myocardial Infarction; Peripheral Arterial Disease; Peripheral Venous Disease; Phlebitis; Vasculitis Gastrointestinal Medical HistoryLEIGHLANI, BOWLEN ([id number]) [id number] Page 8 of 9 Negative for: Cirrhosis ; Colitis; Crohns; Hepatitis A; Hepatitis B; Hepatitis C Past Medical History Notes: GERD Endocrine Medical History: Positive for: Type II Diabetes - 3/24 HgbA1c 12.2 Negative for: Type I Diabetes Time with diabetes: 1 year ago Treated with: Insulin, Diet Blood sugar tested every day: Yes Tested : x3 a day Genitourinary Medical  History: Negative for: End Stage Renal Disease Immunological Medical History: Negative for: Lupus Erythematosus; Raynauds; Scleroderma Integumentary (Skin) Medical History: Negative for: History of Burn Musculoskeletal Medical History: Positive for: Gout; Osteoarthritis; Osteomyelitis - fingers Negative for: Rheumatoid Arthritis Past Medical History Notes: Charcot Joint right foot Neurologic Medical History: Positive for: Neuropathy Negative for: Dementia; Quadriplegia; Paraplegia; Seizure Disorder Oncologic Medical History: Negative for: Received Chemotherapy; Received Radiation Psychiatric Medical History: Negative for: Anorexia/bulimia; Confinement Anxiety Immunizations Pneumococcal Vaccine: Received Pneumococcal Vaccination: No Immunization Notes: last tetanus shot 6 years ago Implantable Devices No devices added Hospitalization / Surgery History Type of Hospitalization/Surgery left ankle reconstruction left ankle reconstruction left foot surgery (charcots) right knee septic right knee replacement left knee replacement 11/05/2022 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/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 IandD abscess 09/2017 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 right ankle reconstruction x3 surgeries 12/23 Family and Social History ROZIE, RYKOWSKI ([id number]) [id number] Page 9 of 9 Cancer: Yes - Mother; Diabetes: Yes - Mother; Heart Disease: No; Hereditary Spherocytosis: No; Hypertension: No; Kidney Disease: No; Lung Disease: No; Seizures: No; Stroke: Yes - Father; Thyroid Problems: No; Tuberculosis: No; Never smoker; Marital Status - Married; Alcohol Use: Rarely; Drug Use: No History; Caffeine Use: Rarely - coke; Financial Concerns: No; Food, Clothing or Shelter Needs: No; Support System Lacking: No; Transportation Concerns: No Electronic Signature(s) Signed: 11/23/2022 5:19:21 PM By: Geralyn Corwin DO Signed: 11/23/2022 6:06:14  PM By: Shawn Stall RN, BSN Entered By: Shawn Stall on 11/23/2022 10:37:51 -------------------------------------------------------------------------------- SuperBill Details Patient Name: Date of Service: [name] Draft NDRA [facility]. 11/23/2022 Medical Record Number: [id number] Patient Account Number: [id number] Date of Birth/Sex: Treating RN: 1966-04-24 (57 y.o. 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 transphalangeal amputation of right ring finger, initial encounter Facility Procedures : CPT4 Code: [id number] Description: 34742 - DEBRIDE W/O ANES NON SELECT Modifier: Quantity: 1 Physician Procedures : CPT4 Code Description Modifier [id number] [phone] - WC PHYS LEVEL 4 - NEW PT ICD-10 Diagnosis Description E11.621 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

## 2022-11-24 NOTE — Progress Notes (Signed)
DOREY, ADDIS Park ([id number]) [id number] Page 1 of 4 Visit Report for 11/23/2022 Abuse Risk Screen Details Patient Name: Date of Service: [name] NDRA [facility]. 11/23/2022 9:30 A M Medical Record Number: [id number] Patient Account Number: [id number] Date of Birth/Sex: Treating RN: 05-24-1965 (57 y.o. [name], [name] Primary Care [name]: [name] Other Clinician: Referring [name]: Treating [name]/Extender: [name] in Treatment: 0 Abuse Risk Screen Items Answer ABUSE RISK SCREEN: Has anyone close to you tried to hurt or harm you recentlyo No Do you feel uncomfortable with anyone in your familyo No Has anyone forced you do things that you didnt want to doo No Electronic Signature(s) Signed: 11/23/2022 6:06:14 PM By: Shawn Stall RN, BSN Entered By: Shawn Stall on 11/23/2022 10:23:20 -------------------------------------------------------------------------------- Activities of Daily Living Details Patient Name: Date of Service: [name] NDRA Park. 11/23/2022 9:30 A M Medical Record Number: [id number] Patient Account Number: [id number] Date of Birth/Sex: Treating RN: Aug 19, 1965 (57 y.o. [name] Primary Care [name]: [name] Other Clinician: Referring [name]: Treating [name]/Extender: [name] in Treatment: 0 Activities of Daily Living Items Answer Activities of Daily Living (Please select one for each item) Drive Automobile Completely Able T Medications ake Completely Able Use T elephone Completely Able Care for Appearance Completely Able Use T oilet Completely Able Bath / Shower Completely Able Dress Self Completely Able Feed Self Completely Able Walk Completely Able Get In / Out Bed Completely Able Housework Completely Able Prepare Meals Completely Able Handle Money Completely Able Shop for Self Completely Able Electronic  Signature(s) Signed: 11/23/2022 6:06:14 PM By: Shawn Stall RN, BSN Entered By: Shawn Stall on 11/23/2022 10:23:32 Freese, [name] ([id number]) [id number] Page 2 of 4 -------------------------------------------------------------------------------- Education Screening Details Patient Name: Date of Service: [name] N[facility]. 11/23/2022 9:30 A M Medical Record Number: [id number] Patient Account Number: [id number] Date of Birth/Sex: Treating RN: 1966/02/07 (57 y.o. [name], [name] Primary Care [name]: [name] Other Clinician: Referring [name]: Treating [name]/Extender: [name] in Treatment: 0 Primary Learner Assessed: Patient Learning Preferences/Education Level/Primary Language Learning Preference: Explanation, Demonstration, Printed Material Highest Education Level: College or Above Preferred Language: English Cognitive Barrier Language Barrier: No Translator Needed: No Memory Deficit: No Emotional Barrier: No Cultural/Religious Beliefs Affecting Medical Care: No Physical Barrier Impaired Vision: Yes Glasses Impaired Hearing: No Decreased Hand dexterity: No Knowledge/Comprehension Knowledge Level: High Comprehension Level: High Ability to understand written instructions: High Ability to understand verbal instructions: High Motivation Anxiety Level: Calm Cooperation: Cooperative Education Importance: Acknowledges Need Interest in Health Problems: Asks Questions Perception: Coherent Willingness to Engage in Self-Management High Activities: Readiness to Engage in Self-Management High Activities: Electronic Signature(s) Signed: 11/23/2022 6:06:14 PM By: Shawn Stall RN, BSN Entered By: Shawn Stall on 11/23/2022 10:23:54 -------------------------------------------------------------------------------- Fall Risk Assessment Details Patient Name: Date of Service: [name] NDRA Park.  11/23/2022 9:30 A M Medical Record Number: [id number] Patient Account Number: [id number] Date of Birth/Sex: Treating RN: 06-06-1965 (56 y.o. [name] Primary Care [name]: [name] Other Clinician: Referring [name]: Treating Karryn Park/Extender: [name] in Treatment: 0 Fall Risk Assessment Items Have you had 2 or more falls in the last 12 monthso 0 No [name], [name] ([id number]) [id number] Page 3 of 4 Have you had any fall that resulted in injury in the last 12 monthso 0 No FALLS RISK SCREEN History of falling - immediate or within 3 months 0 No Secondary  diagnosis (Do you have 2 or more medical diagnoseso) 0 No Ambulatory aid None/bed rest/wheelchair/nurse 0 Yes Crutches/cane/walker 0 No Furniture 0 No Intravenous therapy Access/Saline/Heparin Lock 0 No Gait/Transferring Normal/ bed rest/ wheelchair 0 Yes Weak (short steps with or without shuffle, stooped but able to lift head while walking, may seek 0 No support from furniture) Impaired (short steps with shuffle, may have difficulty arising from chair, head down, impaired 0 No balance) Mental Status Oriented to own ability 0 Yes Electronic Signature(s) Signed: 11/23/2022 6:06:14 PM By: Shawn Stall RN, BSN Entered By: Shawn Stall on 11/23/2022 10:24:00 -------------------------------------------------------------------------------- Foot Assessment Details Patient Name: Date of Service: [name] NDRA Park. 11/23/2022 9:30 A M Medical Record Number: [id number] Patient Account Number: [id number] Date of Birth/Sex: Treating RN: 03/23/1966 (57 y.o. [name] Primary Care Oather Muilenburg: [name] Other Clinician: Referring [name]: Treating [name]/Extender: [name] in Treatment: 0 Foot Assessment Items Site Locations + = Sensation present, - = Sensation absent, C = Callus, U = Ulcer Park = Redness, W =  Warmth, M = Maceration, PU = Pre-ulcerative lesion F = Fissure, S = Swelling, D = Dryness Assessment Right: Left: Other Deformity: No No Prior Foot Ulcer: No No Prior Amputation: No No Charcot Joint: Yes No Ambulatory Status: Ambulatory Without Help Gait: Steady [name], [name] ([id number]) [id number] Page 4 of 4 Electronic Signature(s) Signed: 11/24/2022 6:21:10 PM By: Shawn Stall RN, BSN Entered By: Shawn Stall on 11/24/2022 15:25:57 -------------------------------------------------------------------------------- Nutrition Risk Screening Details Patient Name: Date of Service: [name] NDRA Park. 11/23/2022 9:30 A M Medical Record Number: [id number] Patient Account Number: [id number] Date of Birth/Sex: Treating RN: [birth year] (57 y.o. [name], Millard.Loa Primary Care Omri Bertran: [name] Other Clinician: Referring Weylyn Ricciuti: Treating Marilea Gwynne/Extender: [name] in Treatment: 0 Height (in): 64 Weight (lbs): 279 Body Mass Index (BMI): 47.9 Nutrition Risk Screening Items Score Screening NUTRITION RISK SCREEN: I have an illness or condition that made me change the kind and/or amount of food I eat 2 Yes I eat fewer than two meals per day 0 No I eat few fruits and vegetables, or milk products 0 No I have three or more drinks of beer, liquor or wine almost every day 0 No I have tooth or mouth problems that make it hard for me to eat 0 No I don't always have enough money to buy the food I need 0 No I eat alone most of the time 0 No I take three or more different prescribed or over-the-counter drugs a day 1 Yes Without wanting to, I have lost or gained 10 pounds in the last six months 0 No I am not always physically able to shop, cook and/or feed myself 0 No Nutrition Protocols Good Risk Protocol Provide education on elevated blood Moderate Risk Protocol 0 sugars and impact on wound healing, as  applicable High Risk Proctocol Risk Level: Moderate Risk Score: 3 Electronic Signature(s) Signed: 11/23/2022 6:06:14 PM By: Shawn Stall RN, BSN Entered By: Shawn Stall on 11/23/2022 10:24:06

## 2022-12-01 ENCOUNTER — Encounter (HOSPITAL_BASED_OUTPATIENT_CLINIC_OR_DEPARTMENT_OTHER): Payer: Commercial Managed Care - HMO | Attending: Internal Medicine | Admitting: Internal Medicine

## 2022-12-01 ENCOUNTER — Other Ambulatory Visit: Payer: Self-pay | Admitting: Internal Medicine

## 2022-12-01 DIAGNOSIS — S68614A Complete traumatic transphalangeal amputation of right ring finger, initial encounter: Secondary | ICD-10-CM | POA: Insufficient documentation

## 2022-12-01 DIAGNOSIS — Z89512 Acquired absence of left leg below knee: Secondary | ICD-10-CM | POA: Insufficient documentation

## 2022-12-01 DIAGNOSIS — L97118 Non-pressure chronic ulcer of right thigh with other specified severity: Secondary | ICD-10-CM | POA: Insufficient documentation

## 2022-12-01 DIAGNOSIS — L97518 Non-pressure chronic ulcer of other part of right foot with other specified severity: Secondary | ICD-10-CM

## 2022-12-01 DIAGNOSIS — X58XXXA Exposure to other specified factors, initial encounter: Secondary | ICD-10-CM | POA: Insufficient documentation

## 2022-12-01 DIAGNOSIS — E11621 Type 2 diabetes mellitus with foot ulcer: Secondary | ICD-10-CM

## 2022-12-02 NOTE — Progress Notes (Signed)
[name], SEMMLER Park ([id number]) [id number] Page 1 of 9 Visit Report for 12/01/2022 Chief Complaint Document Details Patient Name: Date of Service: [name]. 12/01/2022 3:00 PM Medical Record Number: [id number] Patient Account Number: [id number] Date of Birth/Sex: Treating RN: May 22, 1965 (57 y.o. F) Primary Care Provider: Antony Haste Other Clinician: Referring Provider: Treating Provider/Extender: Avelina Laine in Treatment: 1 Information Obtained from: Patient Chief Complaint 11/23/2022; right lateral ankle wound Electronic Signature(s) Signed: 12/01/2022 5:13:05 PM By: Geralyn Corwin DO Entered By: Geralyn Corwin on 12/01/2022 16:01:30 -------------------------------------------------------------------------------- Debridement Details Patient Name: Date of Service: [name]. 12/01/2022 3:00 PM Medical Record Number: [id number] Patient Account Number: [id number] Date of Birth/Sex: Treating RN: 04/21/1966 (57 y.o. [name], [name] Primary Care Provider: Antony Haste Other Clinician: Referring Provider: Treating Provider/Extender: Avelina Laine in Treatment: 1 Debridement Performed for Assessment: Wound #4 Right,Lateral Ankle Performed By: Physician Geralyn Corwin, DO Debridement Type: Debridement Severity of Tissue Pre Debridement: Fat layer exposed Level of Consciousness (Pre-procedure): Awake and Alert Pre-procedure Verification/Time Out Yes - 15:45 Taken: Start Time: 15:46 Pain Control: Lidocaine 4% T opical Solution Percent of Wound Bed Debrided: 100% T Area Debrided (cm): otal 2.47 Tissue and other material debrided: Viable, Non-Viable, Slough, Subcutaneous, Skin: Dermis , Skin: Epidermis, Biofilm, Slough Level: Skin/Subcutaneous Tissue Debridement Description: Excisional Instrument: Curette Bleeding: Minimum Hemostasis Achieved: Pressure End Time:  15:51 Procedural Pain: 0 Post Procedural Pain: 0 Response to Treatment: Procedure was tolerated well Level of Consciousness (Post- Awake and Alert procedure): Post Debridement Measurements of Total Wound Length: (cm) 2.1 Width: (cm) 1.5 Depth: (cm) 0.2 Volume: (cm) 0.495 Character of Wound/Ulcer Post Debridement: Improved Park, [name] ([id number]) [id number] Page 2 of 9 Severity of Tissue Post Debridement: Fat layer exposed Post Procedure Diagnosis Same as Pre-procedure Electronic Signature(s) Signed: 12/01/2022 5:13:05 PM By: Geralyn Corwin DO Signed: 12/01/2022 5:32:16 PM By: Shawn Stall RN, BSN Entered By: Shawn Stall on 12/01/2022 15:51:27 -------------------------------------------------------------------------------- HPI Details Patient Name: Date of Service: [name]. 12/01/2022 3:00 PM Medical Record Number: [id number] Patient Account Number: [id number] Date of Birth/Sex: Treating RN: 1965/05/29 (57 y.o. F) Primary Care Provider: Antony Haste Other Clinician: Referring Provider: Treating Provider/Extender: Avelina Laine in Treatment: 1 History of Present Illness HPI Description: ABI on Right: 1.01 Ms. 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 Most recently she had an amputation of the right ring finger. She presents today with a wound to the right lateral ankle that she has had for 3 months that was caused by her hitting the area with her wheelchair. She has been Vashe dressings to clean the wound bed followed by antibiotic ointment. ABIs in office are 1.01 on the left and she has palpable dorsalis pedis and posterior tibial artery pulses. Her foot is warm and well-perfused. 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 She has been cleaning  the area with Vashe and using Neosporin to the wound bed. She currently denies signs of infection. Patient has a history of ankle surgery on 04-07-22 with right partial hardware removal and revision subtalar fusion. 8/6; patient presents for follow-up. She has been using Santyl to the wound bed and has been taking Augmentin and doxycycline without issues. She is scheduled for her MRI towards the end of the month.  She has no issues or complaints. Electronic Signature(s) Signed: 12/01/2022 5:13:05 PM By: Geralyn Corwin DO Entered By: Geralyn Corwin on 12/01/2022 16:01:51 -------------------------------------------------------------------------------- Physical Exam Details Patient Name: Date of Service: [name] NDRA Park. 12/01/2022 3:00 PM Medical Record Number: [id number] Patient Account Number: [id number] Date of Birth/Sex: Treating RN: 1965/11/29 (57 y.o. F) Primary Care Provider: Antony Haste Other Clinician: Referring Provider: Treating Provider/Extender: Avelina Laine in Treatment: 1 Constitutional respirations regular, non-labored and within target range for patient.. Cardiovascular 2+ dorsalis pedis/posterior tibialis pulses. Psychiatric pleasant and cooperative. Notes Right foot: T the lateral ankle there is an open wound with granulation tissue and nonviable tissue at the wound bed. No purulent drainage. 2+ pitting edema to o the knee. No signs of surrounding infection including increased warmth, erythema or purulent drainage. [name], [name] ([id number]) [id number] Page 3 of 9 Electronic Signature(s) Signed: 12/01/2022 5:13:05 PM By: Geralyn Corwin DO Entered By: Geralyn Corwin on 12/01/2022 16:02:37 -------------------------------------------------------------------------------- Physician Orders Details Patient Name: Date of Service: [name]. 12/01/2022 3:00 PM Medical Record Number:  [id number] Patient Account Number: [id number] Date of Birth/Sex: Treating RN: 1965-10-07 (57 y.o. 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 - Dr. Mikey Bussing Tuesday 3pm 12/08/2022 Return A room 8 Return appointment in 3 weeks. - Dr. Mikey Bussing Tuesday room 8 12/22/2022 Other: - Oral antibiotics from your pharmacy- continue the antibiotics. [facility] wound care supplies. Anesthetic (In clinic) Topical Lidocaine 4% applied to wound bed Bathing/ Shower/ Hygiene May shower with protection but do not get wound dressing(s) wet. Protect dressing(s) with water repellant cover (for example, large plastic bag) or a cast cover and may then take shower. Edema Control - Lymphedema / SCD / Other Elevate legs to the level of the heart or above for 30 minutes daily and/or when sitting for 3-4 times a day throughout the day. Avoid standing for long periods of time. Off-Loading Other: - bunny boot- purchase off of [employer] to protect ankle while sleeping at time. Wound Treatment Wound #4 - Ankle Wound Laterality: Right, Lateral Cleanser: Vashe 5.8 (oz) 1 x Per Day/30 Days Discharge Instructions: Cleanse the wound with Vashe prior to applying a clean dressing using gauze sponges, not tissue or cotton balls. Prim Dressing: Hydrofera Blue Ready Transfer Foam, 2.5x2.5 (in/in) 1 x Per Day/30 Days ary Discharge Instructions: Apply directly to wound bed as directed Prim Dressing: Santyl  Ointment 1 x Per Day/30 Days ary Discharge Instructions: Apply nickel thick amount to wound bed as instructed Secondary Dressing: Woven Gauze Sponges 2x2 in (Generic) 1 x Per Day/30 Days Discharge Instructions: Apply over primary dressing as directed. Secondary Dressing: Zetuvit Plus Silicone Border Dressing 4x4 (in/in) (Generic) 1 x Per Day/30 Days Discharge Instructions: Apply silicone border over primary dressing as directed. Compression Wrap: tubigrip size D one layer 1 x Per Day/30 Days Discharge Instructions: apply in the morning and remove at night. Patient Medications Park, Kailin Park ([id number]) [id number] Page 4 of 9 llergies: Rocephin A Notifications Medication Indication Start End 12/01/2022 amoxicillin-pot clavulanate DOSE 1 - oral 875 mg-125  mg tablet - 1 tablet oral twice a day x 10 day 12/01/2022 doxycycline hyclate DOSE 1 - oral 100 mg tablet - 1 tablet oral twice a day x 10 days Electronic Signature(s) Signed: 12/01/2022 5:13:05 PM By: Geralyn Corwin DO Previous Signature: 12/01/2022 4:06:47 PM Version By: Geralyn Corwin DO Entered By: Geralyn Corwin on 12/01/2022 16:06:59 -------------------------------------------------------------------------------- Problem List Details Patient Name: Date of Service: [name]. 12/01/2022 3:00 PM Medical Record Number: [id number] Patient Account Number: [id number] Date of Birth/Sex: Treating RN: 18-Apr-1966 (57 y.o. 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 12/01/2022 3:00 PM Park, [name] ([id number]) [id number] Page 5 of 9 Medical Record Number: [id number] Patient Account Number: [id number] Date of Birth/Sex: Treating RN: [birth year] (57 y.o. F) Primary Care Provider: Antony Haste Other Clinician: Referring Provider: Treating Provider/Extender: Avelina Laine in Treatment: 1 Subjective Chief Complaint Information obtained from Patient 11/23/2022; right lateral ankle wound History of Present Illness (HPI) ABI on Right: 1.01 Ms. 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 Most recently she had an amputation of the right ring finger. She presents today with a wound to the right lateral ankle that she has had for 3 months that was caused by her hitting the area with her wheelchair. She has been Vashe dressings to clean the wound bed followed by antibiotic ointment. ABIs in office are 1.01 on the left and she has palpable dorsalis pedis and posterior tibial artery pulses. Her foot is warm and well-perfused. 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 She has been cleaning the area with Vashe and using Neosporin to the wound bed. She currently denies signs of infection. Patient has a history of ankle surgery on 04-07-22 with right partial hardware removal and revision subtalar fusion. 8/6;  patient presents for follow-up. She has been using Santyl to the wound bed and has been taking Augmentin and doxycycline without issues. She is scheduled for her MRI towards the end of the month. She has no issues or complaints. Patient History Family History Cancer - Mother, Diabetes - Mother, Stroke - Father, No family history of Heart Disease, Hereditary Spherocytosis, Hypertension, Kidney Disease, Lung Disease, Seizures, Thyroid Problems, Tuberculosis. Social History Never smoker, Marital Status - Married, Alcohol Use - Rarely, Drug Use - No History, Caffeine Use - Rarely - coke. Medical History Eyes Denies history of Cataracts, Glaucoma, Optic Neuritis Ear/Nose/Mouth/Throat Denies history of Chronic sinus problems/congestion, Middle ear problems Hematologic/Lymphatic Patient has history of Anemia Denies history of Hemophilia, Human Immunodeficiency Virus, Lymphedema, Sickle Cell Disease Respiratory Patient has history of Sleep Apnea Denies history of Aspiration, Asthma, Chronic Obstructive Pulmonary Disease (COPD), Pneumothorax, Tuberculosis Cardiovascular Denies history  of Angina, Arrhythmia, Congestive Heart Failure, Coronary Artery Disease, Deep Vein Thrombosis, Hypertension, Hypotension, Myocardial Infarction, Peripheral Arterial Disease, Peripheral Venous Disease, Phlebitis, Vasculitis Gastrointestinal Denies history of Cirrhosis , Colitis, Crohns, Hepatitis A, Hepatitis B, Hepatitis C Endocrine Patient has history of Type II Diabetes - 3/24 HgbA1c 12.2 Denies history of Type I Diabetes Genitourinary Denies history of End Stage Renal Disease Immunological Denies history of Lupus Erythematosus, Raynauds, Scleroderma Integumentary (Skin) Denies history of History of Burn Musculoskeletal Patient has history of Gout, Osteoarthritis, Osteomyelitis - fingers Denies history of Rheumatoid Arthritis Neurologic Patient has history of Neuropathy Denies history of Dementia,  Quadriplegia, Paraplegia, Seizure Disorder Oncologic Denies history of Received Chemotherapy, Received Radiation Psychiatric Denies history of Anorexia/bulimia, Confinement Anxiety Hospitalization/Surgery History - left ankle reconstruction. - left ankle reconstruction. - left foot surgery (charcots). - right knee septic. - right knee replacement. - left knee replacement. - 11/05/2022 right ring finger amputation. - 07/02/2022 left middle finger amputation. - 08/03/2020 IandD abscess. - 09/2017 L BKA. - 2/19 right finger amputation. - right ankle reconstruction x3 surgeries 12/23. Medical A Surgical History Notes nd Respiratory acute bacterial bronchitis 05/16/2022 RSV Gastrointestinal GERD Musculoskeletal Charcot Joint right foot Park, [name] ([id number]) [id number].pdf Page 6 of 9 Objective Constitutional respirations regular, non-labored and within target range for patient.. Vitals Time Taken: 3:20 PM, Height: 64 in, Weight: 261 lbs, BMI: 44.8, Temperature: 98.5 F, Pulse: 87 bpm, Respiratory Rate: 18 breaths/min, Blood Pressure: 107/69 mmHg, Capillary Blood Glucose: 207 mg/dl. Cardiovascular 2+ dorsalis pedis/posterior tibialis pulses. Psychiatric pleasant and cooperative. General Notes: Right foot: T the lateral ankle there is an open wound with granulation tissue and nonviable tissue at the wound bed. No purulent drainage. 2+ o pitting edema to the knee. No signs of surrounding infection including increased warmth, erythema or purulent drainage. Integumentary (Hair, Skin) Wound #4 status is Open. Original cause of wound was Trauma. The date acquired was: 07/27/2022. The wound has been in treatment 1 weeks. The wound is located on the Right,Lateral Ankle. The wound measures 2.1cm length x 1.5cm width x 0.2cm depth; 2.474cm^2 area and 0.495cm^3 volume. There is Fat Layer (Subcutaneous Tissue) exposed. There is no tunneling or undermining noted. There is a  medium amount of serosanguineous drainage noted. The wound margin is distinct with the outline attached to the wound base. There is medium (34-66%) pink granulation within the wound bed. There is a medium (34-66%) amount of necrotic tissue within the wound bed including Adherent Slough. The periwound skin appearance exhibited: Erythema. The periwound skin appearance did not exhibit: Callus, Crepitus, Excoriation, Induration, Rash, Scarring, Dry/Scaly, Maceration, Atrophie Blanche, Cyanosis, Ecchymosis, Hemosiderin Staining, Mottled, Pallor, Rubor. The surrounding wound skin color is noted with erythema which is circumferential. 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 The increased warmth and erythema has improved to the periwound from last visit. I debrided nonviable tissue and recommended continuing with Santyl but adding Hydrofera Blue. Continue aggressive offloading. Continue oral antibiotics. Follow-up in 1 week. Procedures Wound #4 Pre-procedure diagnosis of Wound #4 is a Diabetic Wound/Ulcer of the Lower Extremity located on the Right,Lateral Ankle .Severity of Tissue Pre Debridement is: Fat layer exposed. There was a Excisional Skin/Subcutaneous Tissue Debridement with a total area of 2.47 sq  cm performed by Geralyn Corwin, DO. With the following instrument(s): Curette to remove Viable and Non-Viable tissue/material. Material removed includes Subcutaneous Tissue, Slough, Skin: Dermis, Skin: Epidermis, and Biofilm after achieving pain control using Lidocaine 4% Topical Solution. A time out was conducted at 15:45, prior to the start of the procedure. A Minimum  amount of bleeding was controlled with Pressure. The procedure was tolerated well with a pain level of 0 throughout and a pain level of 0 following the procedure. Post Debridement Measurements: 2.1cm length x 1.5cm width x 0.2cm depth; 0.495cm^3 volume. Character of Wound/Ulcer Post Debridement is improved. Severity of Tissue Post Debridement is: Fat layer exposed. Post procedure Diagnosis Wound #4: Same as Pre-Procedure Plan Follow-up Appointments: Return Appointment in 1 week. - Dr. Mikey Bussing Tuesday 3pm 12/08/2022 room 8 Return appointment in 3 weeks. - Dr. Mikey Bussing Tuesday room 8 12/22/2022 Other: - Oral antibiotics from your pharmacy- continue the antibiotics. [facility] wound care supplies. Anesthetic: (In clinic) Topical Lidocaine 4% applied to wound bed Bathing/ Shower/ Hygiene: May shower with protection but do not get wound dressing(s) wet. Protect dressing(s) with water repellant cover (for example, large plastic bag) or a cast cover and may then take shower. Edema Control - Lymphedema / SCD / Other: Elevate legs to the level of the heart or above for 30 minutes daily and/or when sitting for 3-4 times a day throughout the day. Avoid standing for long periods of time. Off-Loading: Other: - bunny boot- purchase off of [employer] to protect ankle while sleeping at time. The following medication(s) was prescribed: amoxicillin-pot clavulanate oral 875 mg-125 mg tablet 1 1 tablet oral twice a day x 10 day starting 12/01/2022 [name], [name] ([id number]) [phone].pdf Page 7 of 9 doxycycline hyclate oral 100 mg tablet 1 1 tablet oral twice a day x 10 days starting 12/01/2022 WOUND #4: - Ankle Wound Laterality: Right, Lateral Cleanser: Vashe 5.8 (oz) 1 x Per Day/30 Days Discharge Instructions: Cleanse the wound with Vashe prior to applying a clean dressing using gauze sponges, not tissue or cotton balls. Prim Dressing: Hydrofera Blue Ready Transfer Foam, 2.5x2.5 (in/in) 1 x Per  Day/30 Days ary Discharge Instructions: Apply directly to wound bed as directed Prim Dressing: Santyl Ointment 1 x Per Day/30 Days ary Discharge Instructions: Apply nickel thick amount to wound bed as instructed Secondary Dressing: Woven Gauze Sponges 2x2 in (Generic) 1 x Per Day/30 Days Discharge Instructions: Apply over primary dressing as directed. Secondary Dressing: Zetuvit Plus Silicone Border Dressing 4x4 (in/in) (Generic) 1 x Per Day/30 Days Discharge Instructions: Apply silicone border over primary dressing as directed. Com pression Wrap: tubigrip size D one layer 1 x Per Day/30 Days Discharge Instructions: apply in the morning and remove at night. 1. In office sharp debridement 2. Hydrofera Blue and Santyl 3. Aggressive offloading 4. Continue oral antibiotics 5. Follow-up in 1 week Electronic Signature(s) Signed: 12/01/2022 5:13:05 PM By: Geralyn Corwin DO Entered By: Geralyn Corwin on 12/01/2022 16:07:09 -------------------------------------------------------------------------------- HxROS Details Patient Name: Date of Service: [name]. 12/01/2022 3:00 PM Medical Record Number: [id number] Patient Account Number: [id number] Date of Birth/Sex: Treating RN: Jun 14, 1965 (57 y.o. F) Primary Care Provider: Antony Haste Other Clinician: Referring Provider: Treating Provider/Extender: Avelina Laine in Treatment: 1 Eyes Medical History: Negative for: Cataracts; Glaucoma; Optic Neuritis Ear/Nose/Mouth/Throat Medical History: Negative for: Chronic sinus problems/congestion; Middle ear problems Hematologic/Lymphatic Medical History: Positive for: Anemia Negative for: Hemophilia; Human Immunodeficiency Virus; Lymphedema; Sickle Cell Disease Respiratory Medical History: Positive for: Sleep  Apnea Negative for: Aspiration; Asthma; Chronic Obstructive Pulmonary Disease (COPD); Pneumothorax; Tuberculosis Past Medical History Notes: acute  bacterial bronchitis 05/16/2022 RSV Cardiovascular Medical History: Negative for: Angina; Arrhythmia; Congestive Heart Failure; Coronary Artery Disease; Deep Vein Thrombosis; Hypertension; Hypotension; Myocardial Infarction; Peripheral Arterial Disease; Peripheral Venous Disease; Phlebitis; Vasculitis Gastrointestinal Medical History: Negative for: Cirrhosis ; Colitis; Crohns; Hepatitis A; Hepatitis B; Hepatitis C Past Medical History NotesJESIE, STENERSON Park ([id number]) [id number] Page 8 of 9 GERD Endocrine Medical History: Positive for: Type II Diabetes - 3/24 HgbA1c 12.2 Negative for: Type I Diabetes Time with diabetes: 1 year ago Treated with: Insulin, Diet Blood sugar tested every day: Yes Tested : x3 a day Genitourinary Medical History: Negative for: End Stage Renal Disease Immunological Medical History: Negative for: Lupus Erythematosus; Raynauds; Scleroderma Integumentary (Skin) Medical History: Negative for: History of Burn Musculoskeletal Medical History: Positive for: Gout; Osteoarthritis; Osteomyelitis - fingers Negative for: Rheumatoid Arthritis Past Medical History Notes: Charcot Joint right foot Neurologic Medical History: Positive for: Neuropathy Negative for: Dementia; Quadriplegia; Paraplegia; Seizure Disorder Oncologic Medical History: Negative for: Received Chemotherapy; Received Radiation Psychiatric Medical History: Negative for: Anorexia/bulimia; Confinement Anxiety Immunizations Pneumococcal Vaccine: Received Pneumococcal Vaccination: No Immunization Notes: last tetanus shot 6 years ago Implantable Devices No devices added Hospitalization / Surgery History Type of Hospitalization/Surgery left ankle reconstruction left ankle reconstruction left foot surgery (charcots) right knee septic right knee replacement left knee replacement 11/05/2022 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/2022 left middle finger  amputation 08/03/2020 IandD abscess 09/2017 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 right ankle reconstruction x3 surgeries 12/23 Family and Social History Cancer: Yes - Mother; Diabetes: Yes - Mother; Heart Disease: No; Hereditary Spherocytosis: No; Hypertension: No; Kidney Disease: No; Lung Disease: No; Seizures: No; Stroke: Yes - Father; Thyroid Problems: No; Tuberculosis: No; Never smoker; Marital Status - Married; Alcohol Use: Rarely; Drug Use: No Park, [name] ([id number]) [phone].pdf Page 9 of 9 History; Caffeine Use: Rarely - coke; Financial Concerns: No; Food, Clothing or Shelter Needs: No; Support System Lacking: No; Transportation Concerns: No Electronic Signature(s) Signed: 12/01/2022 5:13:05 PM By: Geralyn Corwin DO Entered By: Geralyn Corwin on 12/01/2022 16:01:57 -------------------------------------------------------------------------------- SuperBill Details Patient Name: Date of Service: [name]. 12/01/2022 Medical Record Number: [id number] Patient Account Number: [id number] Date of Birth/Sex: Treating RN: [birth year] (57 y.o. 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, initial encounter L97.518 Non-pressure chronic ulcer of other part of right foot with other specified severity Facility Procedures : CPT4 Code: [id number] Description: 11042 - DEB SUBQ TISSUE 20 SQ CM/< ICD-10 Diagnosis Description E11.621 Type 2 diabetes mellitus with foot ulcer L97.518 Non-pressure chronic ulcer of other part of right foot with other specified sev Modifier: erity Quantity: 1 Physician Procedures :  CPT4 Code Description Modifier [id number] 11042 - WC PHYS SUBQ TISS 20 SQ CM ICD-10 Diagnosis Description E11.621 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

## 2022-12-04 NOTE — Progress Notes (Signed)
LARSYN, LOSA Park ([id number]) [id number] Page 1 of 8 Visit Report for 12/01/2022 Arrival Information Details Patient Name: Date of Service: [name]. 12/01/2022 3:00 PM Medical Record Number: [id number] Patient Account Number: [id number] Date of Birth/Sex: Treating RN: 05-Jun-1965 (57 y.o. F) Primary Care Brenleigh Collet: Antony Haste Other Clinician: Referring Torunn Chancellor: Treating Raylon Lamson/Extender: Avelina Laine in Treatment: 1 Visit Information History Since Last Visit Added or deleted any medications: No Patient Arrived: Ambulatory Any new allergies or adverse reactions: No Arrival Time: 15:19 Had a fall or experienced change in No Accompanied By: self activities of daily living that may affect Transfer Assistance: None risk of falls: Patient Identification Verified: Yes Signs or symptoms of abuse/neglect since last visito No Secondary Verification Process Completed: Yes Hospitalized since last visit: No Patient Requires Transmission-Based Precautions: No Implantable device outside of the clinic excluding No Patient Has Alerts: No cellular tissue based products placed in the center since last visit: Has Dressing in Place as Prescribed: Yes Pain Present Now: No Electronic Signature(s) Signed: 12/04/2022 12:52:02 PM By: Thayer Dallas Entered By: Thayer Dallas on 12/01/2022 15:20:05 -------------------------------------------------------------------------------- Encounter Discharge Information Details Patient Name: Date of Service: [name]. 12/01/2022 3:00 PM Medical Record Number: [id number] Patient Account Number: [id number] Date of Birth/Sex: Treating RN: 08-13-1965 (57 y.o. [name] Primary Care Kanai Hilger: Antony Haste Other Clinician: Referring Jomes Giraldo: Treating Riyah Bardon/Extender: Avelina Laine in Treatment: 1 Encounter Discharge Information Items Post Procedure  Vitals Discharge Condition: Stable Temperature (F): 98.5 Ambulatory Status: Ambulatory Pulse (bpm): 87 Discharge Destination: Home Respiratory Rate (breaths/min): 18 Transportation: Private Auto Blood Pressure (mmHg): 107/69 Accompanied By: self Schedule Follow-up Appointment: Yes Clinical Summary of Care: Electronic Signature(s) Signed: 12/01/2022 5:32:16 PM By: Shawn Stall RN, BSN Entered By: Shawn Stall on 12/01/2022 16:02:46 [name], [name] ([id number]) [id number] Page 2 of 8 -------------------------------------------------------------------------------- Lower Extremity Assessment Details Patient Name: Date of Service: [name]. 12/01/2022 3:00 PM Medical Record Number: [id number] Patient Account Number: [id number] Date of Birth/Sex: Treating RN: 08-06-1965 (57 y.o. F) Primary Care Jonatan Wilsey: Antony Haste Other Clinician: Referring Niquan Charnley: Treating Anett Ranker/Extender: Avelina Laine in Treatment: 1 Edema Assessment Assessed: [name]: No] [name]: No] Edema: [Left: Ye] [Right: s] Calf Left: Right: Point of Measurement: 34.5 cm From Medial Instep 41.6 cm Ankle Left: Right: Point of Measurement: 12.5 cm From Medial Instep 27.5 cm Vascular Assessment Extremity colors, hair growth, and conditions: Extremity Color: [Right:Normal] Hair Growth on Extremity: [Right:No] Temperature of Extremity: [Right:Warm] Capillary Refill: [Right:< 3 seconds] Dependent Rubor: [Right:No No] Electronic Signature(s) Signed: 12/04/2022 12:52:02 PM By: Thayer Dallas Entered By: Thayer Dallas on 12/01/2022 15:21:08 -------------------------------------------------------------------------------- Multi Wound Chart Details Patient Name: Date of Service: [name]. 12/01/2022 3:00 PM Medical Record Number: [id number] Patient Account Number: [id number] Date of Birth/Sex: Treating RN: April 12, 1966 (57 y.o. F) Primary Care  Renise Gillies: Antony Haste Other Clinician: Referring Zaylan Kissoon: Treating Dutch Ing/Extender: Avelina Laine in Treatment: 1 Vital Signs Height(in): 64 Capillary Blood Glucose(mg/dl): 416 Weight(lbs): 606 Pulse(bpm): 87 Body Mass Index(BMI): 44.8 Blood Pressure(mmHg): 107/69 Temperature(F): 98.5 Respiratory Rate(breaths/min): 18 [4:Photos:] [N/A:N/A] Right, Lateral Ankle N/A N/A Wound Location: Trauma N/A N/A Wounding Event: Diabetic Wound/Ulcer of the Lower N/A N/A Primary Etiology: Extremity Anemia, Sleep Apnea, Type II N/A N/A Comorbid History: Diabetes, Gout, Osteoarthritis, Osteomyelitis, Neuropathy 07/27/2022 N/A N/A Date Acquired: 1 N/A N/A Weeks of Treatment: Open N/A N/A Wound Status: No N/A N/A Wound Recurrence: Yes  N/A N/A Pending A mputation on Presentation: 2.1x1.5x0.2 N/A N/A Measurements L x W x D (cm) 2.474 N/A N/A A (cm) : rea 0.495 N/A N/A Volume (cm) : 14.40% N/A N/A % Reduction in A rea: 14.40% N/A N/A % Reduction in Volume: Grade 1 N/A N/A Classification: Medium N/A N/A Exudate A mount: Serosanguineous N/A N/A Exudate Type: red, brown N/A N/A Exudate Color: Distinct, outline attached N/A N/A Wound Margin: Medium (34-66%) N/A N/A Granulation A mount: Pink N/A N/A Granulation Quality: Medium (34-66%) N/A N/A Necrotic A mount: Fat Layer (Subcutaneous Tissue): Yes N/A N/A Exposed Structures: Fascia: No Tendon: No Muscle: No Joint: No Bone: No None N/A N/A Epithelialization: Debridement - Excisional N/A N/A Debridement: Pre-procedure Verification/Time Out 15:45 N/A N/A Taken: Lidocaine 4% Topical Solution N/A N/A Pain Control: Subcutaneous, Slough N/A N/A Tissue Debrided: Skin/Subcutaneous Tissue N/A N/A Level: 2.47 N/A N/A Debridement A (sq cm): rea Curette N/A N/A Instrument: Minimum N/A N/A Bleeding: Pressure N/A N/A Hemostasis A chieved: 0 N/A N/A Procedural Pain: 0 N/A N/A Post  Procedural Pain: Procedure was tolerated well N/A N/A Debridement Treatment Response: 2.1x1.5x0.2 N/A N/A Post Debridement Measurements L x W x D (cm) 0.495 N/A N/A Post Debridement Volume: (cm) Excoriation: No N/A N/A Periwound Skin Texture: Induration: No Callus: No Crepitus: No Rash: No Scarring: No Maceration: No N/A N/A Periwound Skin Moisture: Dry/Scaly: No Erythema: Yes N/A N/A Periwound Skin Color: Atrophie Blanche: No Cyanosis: No Ecchymosis: No Hemosiderin Staining: No Mottled: No Pallor: No Rubor: No Circumferential N/A N/A Erythema Location: Debridement N/A N/A Procedures Performed: Treatment Notes Electronic Signature(s) Signed: 12/01/2022 5:13:05 PM By: Geralyn Corwin DO Entered By: Geralyn Corwin on 12/01/2022 15:53:39 Sangster, Ameerah Park ([id number]) [id number] Page 4 of 8 -------------------------------------------------------------------------------- Multi-Disciplinary Care Plan Details Patient Name: Date of Service: [name]. 12/01/2022 3:00 PM Medical Record Number: [id number] Patient Account Number: [id number] Date of Birth/Sex: Treating RN: [birth year] (57 y.o. [name], Yvonne Kendall Primary Care Veronica Guerrant: Antony Haste Other Clinician: Referring Lunetta Marina: Treating Marlin Brys/Extender: Avelina Laine in Treatment: 1 Active Inactive Necrotic Tissue Nursing Diagnoses: Knowledge deficit related to management of necrotic/devitalized tissue Goals: Necrotic/devitalized tissue will be minimized in the wound bed Date Initiated: 11/23/2022 Target Resolution Date: 12/25/2022 Goal Status: Active Patient/caregiver will verbalize understanding of reason and process for debridement of necrotic tissue Date Initiated: 11/23/2022 Target Resolution Date: 12/25/2022 Goal Status: Active Interventions: Assess patient pain level pre-, during and post procedure and prior to discharge Provide education on  necrotic tissue and debridement process Treatment Activities: Biologic debridement : 11/23/2022 Excisional debridement : 11/23/2022 Notes: Nutrition Nursing Diagnoses: Potential for alteratiion in Nutrition/Potential for imbalanced nutrition Goals: Patient/caregiver agrees to and verbalizes understanding of need to obtain nutritional consultation Date Initiated: 11/23/2022 Target Resolution Date: 12/25/2022 Goal Status: Active Patient/caregiver will maintain therapeutic glucose control Date Initiated: 11/23/2022 Target Resolution Date: 12/25/2022 Goal Status: Active Interventions: Assess HgA1c results as ordered upon admission and as needed Provide education on elevated blood sugars and impact on wound healing Provide education on nutrition Treatment Activities: Obtain HgA1c : 11/23/2022 Patient referred to Primary Care Physician for further nutritional evaluation : 11/23/2022 Notes: Pain, Acute or Chronic Nursing Diagnoses: Potential alteration in comfort, pain Goals: Patient will verbalize adequate pain control and receive pain control interventions during procedures as needed Date Initiated: 11/23/2022 Target Resolution Date: 12/25/2022 Goal Status: Active Patient/caregiver will verbalize comfort level met Date Initiated: 11/23/2022 Target Resolution Date: 12/25/2022 Goal Status: Active Interventions: [name], [name] ([id number]) [phone].pdf Page  5 of 8 Encourage patient to take pain medications as prescribed Provide education on pain management Reposition patient for comfort Treatment Activities: Administer pain control measures as ordered : 11/23/2022 Notes: Wound/Skin Impairment Nursing Diagnoses: Knowledge deficit related to ulceration/compromised skin integrity Goals: Ulcer/skin breakdown will heal within 14 weeks Date Initiated: 11/23/2022 Target Resolution Date: 02/18/2023 Goal Status: Active Interventions: Assess patient/caregiver ability  to perform ulcer/skin care regimen upon admission and as needed Assess ulceration(s) every visit Provide education on ulcer and skin care Treatment Activities: Skin care regimen initiated : 11/23/2022 Topical wound management initiated : 11/23/2022 Notes: Electronic Signature(s) Signed: 12/01/2022 5:32:16 PM By: Shawn Stall RN, BSN Entered By: Shawn Stall on 12/01/2022 15:16:01 -------------------------------------------------------------------------------- Pain Assessment Details Patient Name: Date of Service: [name]. 12/01/2022 3:00 PM Medical Record Number: [id number] Patient Account Number: [id number] Date of Birth/Sex: Treating RN: 12-Apr-1966 (57 y.o. F) Primary Care Jyla Hopf: Antony Haste Other Clinician: Referring Shamyah Stantz: Treating Miniya Miguez/Extender: Avelina Laine in Treatment: 1 Active Problems Location of Pain Severity and Description of Pain Patient Has Paino No Site Locations Pain Management and Medication Current Pain Management: [name], [name] ([id number]) [phone].pdf Page 6 of 8 Electronic Signature(s) Signed: 12/04/2022 12:52:02 PM By: Thayer Dallas Entered By: Thayer Dallas on 12/01/2022 15:20:45 -------------------------------------------------------------------------------- Patient/Caregiver Education Details Patient Name: Date of Service: [name]. 8/6/2024andnbsp3:00 PM Medical Record Number: [id number] Patient Account Number: [id number] Date of Birth/Gender: Treating RN: May 15, 1965 (57 y.o. [name] Primary Care Physician: Antony Haste Other Clinician: Referring Physician: Treating Physician/Extender: Avelina Laine in Treatment: 1 Education Assessment Education Provided To: Patient Education Topics Provided Wound/Skin Impairment: Handouts: Caring for Your Ulcer Methods: Explain/Verbal Responses: Reinforcements needed Electronic  Signature(s) Signed: 12/01/2022 5:32:16 PM By: Shawn Stall RN, BSN Entered By: Shawn Stall on 12/01/2022 15:16:13 -------------------------------------------------------------------------------- Wound Assessment Details Patient Name: Date of Service: [name]. 12/01/2022 3:00 PM Medical Record Number: [id number] Patient Account Number: [id number] Date of Birth/Sex: Treating RN: September 30, 1965 (57 y.o. F) Primary Care Fischer Halley: Antony Haste Other Clinician: Referring Alanzo Lamb: Treating Kato Wieczorek/Extender: Avelina Laine in Treatment: 1 Wound Status Wound Number: 4 Primary Diabetic Wound/Ulcer of the Lower Extremity Etiology: Wound Location: Right, Lateral Ankle Wound Open Wounding Event: Trauma Status: Date Acquired: 07/27/2022 Comorbid Anemia, Sleep Apnea, Type II Diabetes, Gout, Osteoarthritis, Weeks Of Treatment: 1 History: Osteomyelitis, Neuropathy Clustered Wound: No Pending Amputation On Presentation Photos [name], [name] ([id number]) [id number] Page 7 of 8 Wound Measurements Length: (cm) 2.1 Width: (cm) 1.5 Depth: (cm) 0.2 Area: (cm) 2.474 Volume: (cm) 0.495 % Reduction in Area: 14.4% % Reduction in Volume: 14.4% Epithelialization: None Tunneling: No Undermining: No Wound Description Classification: Grade 1 Wound Margin: Distinct, outline attached Exudate Amount: Medium Exudate Type: Serosanguineous Exudate Color: red, brown Foul Odor After Cleansing: No Slough/Fibrino Yes Wound Bed Granulation Amount: Medium (34-66%) Exposed Structure Granulation Quality: Pink Fascia Exposed: No Necrotic Amount: Medium (34-66%) Fat Layer (Subcutaneous Tissue) Exposed: Yes Necrotic Quality: Adherent Slough Tendon Exposed: No Muscle Exposed: No Joint Exposed: No Bone Exposed: No Periwound Skin Texture Texture Color No Abnormalities Noted: No No Abnormalities Noted: No Callus: No Atrophie Blanche:  No Crepitus: No Cyanosis: No Excoriation: No Ecchymosis: No Induration: No Erythema: Yes Rash: No Erythema Location: Circumferential Scarring: No Hemosiderin Staining: No Mottled: No Moisture Pallor: No No Abnormalities Noted: No Rubor: No Dry / Scaly: No Maceration: No Treatment Notes Wound #4 (Ankle) Wound Laterality: Right, Lateral Cleanser Vashe 5.8 (  oz) Discharge Instruction: Cleanse the wound with Vashe prior to applying a clean dressing using gauze sponges, not tissue or cotton balls. Peri-Wound Care Topical Primary Dressing Hydrofera Blue Ready Transfer Foam, 2.5x2.5 (in/in) Discharge Instruction: Apply directly to wound bed as directed Santyl Ointment Discharge Instruction: Apply nickel thick amount to wound bed as instructed Secondary Dressing Woven Gauze Sponges 2x2 in Discharge Instruction: Apply over primary dressing as directed. Zetuvit Plus Silicone Border Dressing 4x4 (in/in) Discharge Instruction: Apply silicone border over primary dressing as directed. [name], [name] ([id number]) [id number] Page 8 of 8 Secured With Compression Wrap tubigrip size D one layer Discharge Instruction: apply in the morning and remove at night. Compression Stockings Add-Ons Electronic Signature(s) Signed: 12/04/2022 12:52:02 PM By: Thayer Dallas Entered By: Thayer Dallas on 12/01/2022 15:25:57 -------------------------------------------------------------------------------- Vitals Details Patient Name: Date of Service: [name]. 12/01/2022 3:00 PM Medical Record Number: [id number] Patient Account Number: [id number] Date of Birth/Sex: Treating RN: Dec 09, 1965 (57 y.o. F) Primary Care Kaeo Jacome: Antony Haste Other Clinician: Referring Nani Ingram: Treating Kelsei Defino/Extender: Avelina Laine in Treatment: 1 Vital Signs Time Taken: 15:20 Temperature (F): 98.5 Height (in): 64 Pulse (bpm): 87 Weight (lbs):  261 Respiratory Rate (breaths/min): 18 Body Mass Index (BMI): 44.8 Blood Pressure (mmHg): 107/69 Capillary Blood Glucose (mg/dl): 284 Reference Range: 80 - 120 mg / dl Electronic Signature(s) Signed: 12/01/2022 5:32:16 PM By: Shawn Stall RN, BSN Entered By: Shawn Stall on 12/01/2022 15:54:20

## 2022-12-08 ENCOUNTER — Encounter (HOSPITAL_BASED_OUTPATIENT_CLINIC_OR_DEPARTMENT_OTHER): Payer: Commercial Managed Care - HMO | Admitting: Internal Medicine

## 2022-12-08 DIAGNOSIS — L97518 Non-pressure chronic ulcer of other part of right foot with other specified severity: Secondary | ICD-10-CM

## 2022-12-08 DIAGNOSIS — E11621 Type 2 diabetes mellitus with foot ulcer: Secondary | ICD-10-CM

## 2022-12-08 NOTE — Progress Notes (Signed)
[name] Park ([id number]) [id number] Page 1 of 9 Visit Report for 12/08/2022 Chief Complaint Document Details Patient Name: Date of Service: [name]. 12/08/2022 3:00 PM Medical Record Number: [id number] Patient Account Number: [id number] Date of Birth/Sex: Treating RN: Feb 08, 1966 (57 y.o. F) Primary Care Provider: Antony Haste Other Clinician: Referring Provider: Treating Provider/Extender: Avelina Laine in Treatment: 2 Information Obtained from: Patient Chief Complaint 11/23/2022; right lateral ankle wound Electronic Signature(s) Signed: 12/08/2022 4:37:08 PM By: Geralyn Corwin DO Entered By: Geralyn Corwin on 12/08/2022 16:08:24 -------------------------------------------------------------------------------- Debridement Details Patient Name: Date of Service: [name]. 12/08/2022 3:00 PM Medical Record Number: [id number] Patient Account Number: [id number] Date of Birth/Sex: Treating RN: 1965/08/23 (57 y.o. Fredderick Phenix Primary Care Provider: Antony Haste Other Clinician: Referring Provider: Treating Provider/Extender: Avelina Laine in Treatment: 2 Debridement Performed for Assessment: Wound #4 Right,Lateral Ankle Performed By: Physician Geralyn Corwin, DO Debridement Type: Debridement Severity of Tissue Pre Debridement: Fat layer exposed Level of Consciousness (Pre-procedure): Awake and Alert Pre-procedure Verification/Time Out Yes - 15:26 Taken: Start Time: 15:26 Pain Control: Lidocaine 5% topical ointment Percent of Wound Bed Debrided: 100% T Area Debrided (cm): otal 3.01 Tissue and other material debrided: Non-Viable, Slough, Slough Level: Non-Viable Tissue Debridement Description: Selective/Open Wound Instrument: Curette Bleeding: Minimum Hemostasis Achieved: Pressure Response to Treatment: Procedure was tolerated well Level of Consciousness  (Post- Awake and Alert procedure): Post Debridement Measurements of Total Wound Length: (cm) 2.4 Width: (cm) 1.6 Depth: (cm) 0.2 Volume: (cm) 0.603 Character of Wound/Ulcer Post Debridement: Improved Severity of Tissue Post Debridement: Fat layer exposed Post Procedure Diagnosis [name], [name] ([id number]) [id number] Page 2 of 9 Same as Pre-procedure Notes scribed for Dr. Mikey Bussing by Samuella Bruin, RN Electronic Signature(s) Signed: 12/08/2022 4:28:55 PM By: Samuella Bruin Signed: 12/08/2022 4:37:08 PM By: Geralyn Corwin DO Entered By: Samuella Bruin on 12/08/2022 15:27:31 -------------------------------------------------------------------------------- HPI Details Patient Name: Date of Service: [name]. 12/08/2022 3:00 PM Medical Record Number: [id number] Patient Account Number: [id number] Date of Birth/Sex: Treating RN: Jun 25, 1965 (57 y.o. F) Primary Care Provider: Antony Haste Other Clinician: Referring Provider: Treating Provider/Extender: Avelina Laine in Treatment: 2 History of Present Illness HPI Description: ABI on Right: 1.01 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 Most recently she had an amputation of the right ring finger. She presents today with a wound to the right lateral ankle that she has had for 3 months that was caused by her hitting the area with her wheelchair. She has been Vashe dressings to clean the wound bed followed by antibiotic ointment. ABIs in office are 1.01 on the left and she has palpable dorsalis pedis and posterior tibial artery pulses. Her foot is warm and well-perfused. 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 She has been cleaning the area with Vashe and using Neosporin to the wound bed. She  currently denies signs of infection. Patient has a history of ankle surgery on 04-07-22 with right partial hardware removal and revision subtalar fusion. 8/6; patient presents for follow-up. She has been using Santyl to the wound bed and has been taking Augmentin and doxycycline without issues. She is scheduled for her MRI towards the end of the month. She has no issues or complaints. 8/13; patient presents for follow-up.  She is scheduled for MRI next week. She has been using Santyl and Hydrofera Blue to the wound bed. She is taking Augmentin and doxycycline without issues. She has been on her feet more recently and has noted slightly more swelling to the ankle and more erythema. She denies systemic signs of infection. Electronic Signature(s) Signed: 12/08/2022 4:37:08 PM By: Geralyn Corwin DO Entered By: Geralyn Corwin on 12/08/2022 16:09:12 -------------------------------------------------------------------------------- Physical Exam Details Patient Name: Date of Service: [name] NDRA Park. 12/08/2022 3:00 PM Medical Record Number: [id number] Patient Account Number: [id number] Date of Birth/Sex: Treating RN: [birth year] (57 y.o. F) Primary Care Provider: Antony Haste Other Clinician: Referring Provider: Treating Provider/Extender: Avelina Laine in Treatment: 2 Constitutional respirations regular, non-labored and within target range for patient.. Cardiovascular 2+ dorsalis pedis/posterior tibialis pulses. Psychiatric pleasant and cooperative. [name], [name] ([id number]) [id number] Page 3 of 9 Notes Right foot: T the lateral ankle there is an open wound with granulation tissue and nonviable tissue at the wound bed. No purulent drainage. 2+ pitting edema to o the knee. No signs of surrounding infection including increased warmth, erythema or purulent drainage. Electronic Signature(s) Signed: 12/08/2022 4:37:08 PM By:  Geralyn Corwin DO Entered By: Geralyn Corwin on 12/08/2022 16:09:39 -------------------------------------------------------------------------------- Physician Orders Details Patient Name: Date of Service: [name]. 12/08/2022 3:00 PM Medical Record Number: [id number] Patient Account Number: [id number] Date of Birth/Sex: Treating RN: [birth year] (57 y.o. Fredderick Phenix Primary Care Provider: Antony Haste Other Clinician: Referring Provider: Treating Provider/Extender: Avelina Laine in Treatment: 2 Verbal / Phone Orders: No Diagnosis Coding Follow-up Appointments ppointment in 2 weeks. - Dr. Mikey Bussing - room 8 Return A Other: - Oral antibiotics from your pharmacy- continue the antibiotics. [facility] wound care supplies. Anesthetic (In clinic) Topical Lidocaine 5% applied to wound bed Bathing/ Shower/ Hygiene May shower with protection but do not get wound dressing(s) wet. Protect dressing(s) with water repellant cover (for example, large plastic bag) or a cast cover and may then take shower. Edema Control - Lymphedema / SCD / Other Elevate legs to the level of the heart or above for 30 minutes daily and/or when sitting for 3-4 times a day throughout the day. Avoid standing for long periods of time. Off-Loading Other: - bunny boot- purchase off of [employer] to protect ankle while sleeping at time. Wound Treatment Wound #4 - Ankle Wound Laterality: Right, Lateral Cleanser: Vashe 5.8 (oz) 1 x Per Day/30 Days Discharge Instructions: Cleanse the wound with Vashe prior to applying a clean dressing using gauze sponges, not tissue or cotton balls. Prim Dressing: Hydrofera Blue Ready Transfer Foam, 2.5x2.5 (in/in) (DME) (Generic) 1 x Per Day/30 Days ary Discharge Instructions: Apply directly to wound bed as directed Prim Dressing: Santyl Ointment 1 x Per Day/30 Days ary Discharge Instructions: Apply nickel thick amount to wound bed as  instructed Secondary Dressing: Woven Gauze Sponges 2x2 in (Generic) 1 x Per Day/30 Days Discharge Instructions: Apply over primary dressing as directed. Secondary Dressing: Zetuvit Plus Silicone Border Dressing 4x4 (in/in) (Generic) 1 x Per Day/30 Days Discharge Instructions: Apply silicone border over primary dressing as directed. Compression Wrap: tubigrip size D one layer 1 x Per Day/30 Days Discharge Instructions: apply in the morning and remove at night. Patient Medications llergies: Rocephin A Notifications Medication Indication Start End 12/08/2022 lidocaine DOSE topical 5 % ointment - ointment topical 12/08/2022 amoxicillin-pot clavulanate DOSE 1 - oral 875 mg-125 mg tablet - 1 tablet oral twice a day  x 10 days [name], [name] ([id number]) [id number] Page 4 of 9 12/08/2022 doxycycline hyclate DOSE 1 - oral 100 mg tablet - 1 tablet oral twice a day x 10 days Electronic Signature(s) Signed: 12/08/2022 4:37:08 PM By: Geralyn Corwin DO Previous Signature: 12/08/2022 4:13:14 PM Version By: Geralyn Corwin DO Entered By: Geralyn Corwin on 12/08/2022 16:13:31 -------------------------------------------------------------------------------- Problem List Details Patient Name: Date of Service: [name]. 12/08/2022 3:00 PM Medical Record Number: [id number] Patient Account Number: [id number] Date of Birth/Sex: Treating RN: 05/26/1965 (57 y.o. 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 12/08/2022 3:00 PM Medical Record Number: [id number] Patient Account Number: [id number] Date of Birth/Sex: Treating RN: March 13, 1966 (57 y.o. F) Primary Care Provider: Antony Haste Other Clinician: Referring Provider: Treating Provider/Extender: Avelina Laine in Treatment: 2 [name], [name] ([id number]) [id number] Page 5 of 9 Subjective Chief Complaint Information obtained from Patient 11/23/2022; right lateral ankle wound History of Present Illness (HPI) ABI on Right: 1.01 Ms. 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 Most recently she had an amputation of the right ring finger. She presents today with a wound to the right lateral ankle that she has had for 3 months that was caused by her hitting the area with her wheelchair. She has been Vashe dressings to clean the wound bed followed by antibiotic ointment. ABIs in office are 1.01 on the left and she has palpable dorsalis pedis and posterior tibial artery pulses. Her foot is warm and well-perfused. 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 She has been cleaning the area with Vashe and using Neosporin to the wound bed. She currently denies signs of infection. Patient has a history of ankle surgery on 04-07-22 with right partial hardware removal and revision subtalar fusion. 8/6; patient presents for follow-up. She has  been using Santyl to the wound bed and has been taking Augmentin and doxycycline without issues. She is scheduled for her MRI towards the end of the month. She has no issues or complaints. 8/13; patient presents for follow-up. She is scheduled for MRI next week. She has been using Santyl and Hydrofera Blue to the wound bed. She is taking Augmentin and doxycycline without issues. She has been on her feet more recently and has noted slightly more swelling to the ankle and more erythema. She denies systemic signs of infection. Patient History Family History Cancer - Mother, Diabetes - Mother, Stroke - Father, No family history of Heart Disease, Hereditary Spherocytosis, Hypertension, Kidney Disease, Lung Disease, Seizures, Thyroid Problems, Tuberculosis. Social History Never smoker, Marital Status - Married, Alcohol Use - Rarely, Drug Use - No History, Caffeine Use - Rarely - coke. Medical History Eyes  Denies history of Cataracts, Glaucoma, Optic Neuritis Ear/Nose/Mouth/Throat Denies history of Chronic sinus problems/congestion, Middle ear problems Hematologic/Lymphatic Patient has history of Anemia Denies history of Hemophilia, Human Immunodeficiency Virus, Lymphedema, Sickle Cell Disease Respiratory Patient has history of Sleep Apnea Denies history of Aspiration, Asthma, Chronic Obstructive Pulmonary Disease (COPD), Pneumothorax, Tuberculosis Cardiovascular Denies history of Angina, Arrhythmia, Congestive Heart Failure, Coronary Artery Disease, Deep Vein Thrombosis, Hypertension, Hypotension, Myocardial Infarction, Peripheral Arterial Disease, Peripheral Venous Disease, Phlebitis, Vasculitis Gastrointestinal Denies history of Cirrhosis , Colitis, Crohns, Hepatitis A, Hepatitis B, Hepatitis C Endocrine Patient has history of Type II Diabetes - 3/24 HgbA1c 12.2 Denies history of Type I Diabetes Genitourinary Denies history of End Stage Renal Disease Immunological Denies history of Lupus  Erythematosus, Raynauds, Scleroderma Integumentary (Skin) Denies history of History of Burn Musculoskeletal Patient has history of Gout, Osteoarthritis, Osteomyelitis - fingers Denies history of Rheumatoid Arthritis Neurologic Patient has history of Neuropathy Denies history of Dementia, Quadriplegia, Paraplegia, Seizure Disorder Oncologic Denies history of Received Chemotherapy, Received Radiation Psychiatric Denies history of Anorexia/bulimia, Confinement Anxiety Hospitalization/Surgery History - left ankle reconstruction. - left ankle reconstruction. - left foot surgery (charcots). - right knee septic. - right knee replacement. - left knee replacement. - 11/05/2022 right ring finger amputation. - 07/02/2022 left middle finger amputation. - 08/03/2020 IandD abscess. - 09/2017 L BKA. - 2/19 right finger amputation. - right ankle reconstruction x3 surgeries 12/23. Medical A Surgical History Notes nd Respiratory acute bacterial bronchitis 05/16/2022 RSV Gastrointestinal GERD Musculoskeletal Charcot Joint right foot Shepperson, [name] ([id number]) [phone].pdf Page 6 of 9 Objective Constitutional respirations regular, non-labored and within target range for patient.. Vitals Time Taken: 3:10 PM, Height: 64 in, Weight: 261 lbs, BMI: 44.8, Temperature: 98.1 F, Pulse: 93 bpm, Respiratory Rate: 18 breaths/min, Blood Pressure: 132/75 mmHg. Cardiovascular 2+ dorsalis pedis/posterior tibialis pulses. Psychiatric pleasant and cooperative. General Notes: Right foot: T the lateral ankle there is an open wound with granulation tissue and nonviable tissue at the wound bed. No purulent drainage. 2+ o pitting edema to the knee. No signs of surrounding infection including increased warmth, erythema or purulent drainage. Integumentary (Hair, Skin) Wound #4 status is Open. Original cause of wound was Trauma. The date acquired was: 07/27/2022. The wound has been in treatment 2  weeks. The wound is located on the Right,Lateral Ankle. The wound measures 2.4cm length x 1.6cm width x 0.2cm depth; 3.016cm^2 area and 0.603cm^3 volume. There is Fat Layer (Subcutaneous Tissue) exposed. There is no tunneling or undermining noted. There is a medium amount of serosanguineous drainage noted. The wound margin is distinct with the outline attached to the wound base. There is small (1-33%) pink granulation within the wound bed. There is a large (67-100%) amount of necrotic tissue within the wound bed including Adherent Slough. The periwound skin appearance exhibited: Erythema. The periwound skin appearance did not exhibit: Callus, Crepitus, Excoriation, Induration, Rash, Scarring, Dry/Scaly, Maceration, Atrophie Blanche, Cyanosis, Ecchymosis, Hemosiderin Staining, Mottled, Pallor, Rubor. The surrounding wound skin color is noted with erythema which is circumferential. Periwound temperature was noted as No Abnormality. 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 I debrided nonviable tissue. I recommended continuing Santyl and Hydrofera Blue. MRI scheduled for next week. She is currently on oral doxycycline and Augmentin and I  recommended continuing this. We will do this for a total of 30 days. She is at high risk for amputation and already has a below-knee amputation on the left. Multiple amputations to her fingers. Procedures Wound #4 Pre-procedure diagnosis of Wound #4 is a Diabetic Wound/Ulcer of the Lower Extremity located on the Right,Lateral Ankle .Severity of Tissue Pre Debridement is: Fat layer exposed. There was a Selective/Open Wound Non-Viable Tissue Debridement  with a total area of 3.01 sq cm performed by Geralyn Corwin, DO. With the following instrument(s): Curette to remove Non-Viable tissue/material. Material removed includes [facility] after achieving pain control using Lidocaine 5% topical ointment. No specimens were taken. A time out was conducted at 15:26, prior to the start of the procedure. A Minimum amount of bleeding was controlled with Pressure. The procedure was tolerated well. Post Debridement Measurements: 2.4cm length x 1.6cm width x 0.2cm depth; 0.603cm^3 volume. Character of Wound/Ulcer Post Debridement is improved. Severity of Tissue Post Debridement is: Fat layer exposed. Post procedure Diagnosis Wound #4: Same as Pre-Procedure General Notes: scribed for Dr. Mikey Bussing by Samuella Bruin, RN. Plan Follow-up Appointments: Return Appointment in 2 weeks. - Dr. Mikey Bussing - room 8 Other: - Oral antibiotics from your pharmacy- continue the antibiotics. [facility] wound care supplies. Anesthetic: (In clinic) Topical Lidocaine 5% applied to wound bed Bathing/ Shower/ Hygiene: May shower with protection but do not get wound dressing(s) wet. Protect dressing(s) with water repellant cover (for example, large plastic bag) or a cast cover and may then take shower. Edema Control - Lymphedema / SCD / Other: Elevate legs to the level of the heart or above for 30 minutes daily and/or when sitting for 3-4 times a day throughout the day. Avoid standing for long periods of time. Off-Loading: Other: - bunny boot- purchase off of [employer] to protect ankle while sleeping at time. The following medication(s) was prescribed: lidocaine topical 5 % ointment ointment topical was prescribed at facility amoxicillin-pot clavulanate oral 875 mg-125 mg tablet 1 1 tablet oral twice a day x 10 days starting 12/08/2022 doxycycline hyclate oral 100 mg tablet 1 1 tablet oral twice a day x 10 days starting 12/08/2022 [name], [name] ([id number])  [phone].pdf Page 7 of 9 WOUND #4: - Ankle Wound Laterality: Right, Lateral Cleanser: Vashe 5.8 (oz) 1 x Per Day/30 Days Discharge Instructions: Cleanse the wound with Vashe prior to applying a clean dressing using gauze sponges, not tissue or cotton balls. Prim Dressing: Hydrofera Blue Ready Transfer Foam, 2.5x2.5 (in/in) (DME) (Generic) 1 x Per Day/30 Days ary Discharge Instructions: Apply directly to wound bed as directed Prim Dressing: Santyl Ointment 1 x Per Day/30 Days ary Discharge Instructions: Apply nickel thick amount to wound bed as instructed Secondary Dressing: Woven Gauze Sponges 2x2 in (Generic) 1 x Per Day/30 Days Discharge Instructions: Apply over primary dressing as directed. Secondary Dressing: Zetuvit Plus Silicone Border Dressing 4x4 (in/in) (Generic) 1 x Per Day/30 Days Discharge Instructions: Apply silicone border over primary dressing as directed. Com pression Wrap: tubigrip size D one layer 1 x Per Day/30 Days Discharge Instructions: apply in the morning and remove at night. 1. In office sharp debridement 2. Augmentin and doxycycline 3. Offload the area with padding 4. Follow-up in 2 weeks Electronic Signature(s) Signed: 12/08/2022 4:37:08 PM By: Geralyn Corwin DO Entered By: Geralyn Corwin on 12/08/2022 16:13:41 -------------------------------------------------------------------------------- HxROS Details Patient Name: Date of Service: [name]. 12/08/2022 3:00 PM Medical Record Number: [id number] Patient Account Number: [id number] Date of Birth/Sex: Treating RN: 09-30-1965 (  56 y.o. F) Primary Care Provider: Antony Haste Other Clinician: Referring Provider: Treating Provider/Extender: Avelina Laine in Treatment: 2 Eyes Medical History: Negative for: Cataracts; Glaucoma; Optic Neuritis Ear/Nose/Mouth/Throat Medical History: Negative for: Chronic sinus problems/congestion; Middle ear  problems Hematologic/Lymphatic Medical History: Positive for: Anemia Negative for: Hemophilia; Human Immunodeficiency Virus; Lymphedema; Sickle Cell Disease Respiratory Medical History: Positive for: Sleep Apnea Negative for: Aspiration; Asthma; Chronic Obstructive Pulmonary Disease (COPD); Pneumothorax; Tuberculosis Past Medical History Notes: acute bacterial bronchitis 05/16/2022 RSV Cardiovascular Medical History: Negative for: Angina; Arrhythmia; Congestive Heart Failure; Coronary Artery Disease; Deep Vein Thrombosis; Hypertension; Hypotension; Myocardial Infarction; Peripheral Arterial Disease; Peripheral Venous Disease; Phlebitis; Vasculitis Gastrointestinal Medical History: Negative for: Cirrhosis ; Colitis; Crohns; Hepatitis A; Hepatitis B; Hepatitis C Past Medical History Notes: GERD [name], [name] ([id number]) [id number] Page 8 of 9 Endocrine Medical History: Positive for: Type II Diabetes - 3/24 HgbA1c 12.2 Negative for: Type I Diabetes Time with diabetes: 1 year ago Treated with: Insulin, Diet Blood sugar tested every day: Yes Tested : x3 a day Genitourinary Medical History: Negative for: End Stage Renal Disease Immunological Medical History: Negative for: Lupus Erythematosus; Raynauds; Scleroderma Integumentary (Skin) Medical History: Negative for: History of Burn Musculoskeletal Medical History: Positive for: Gout; Osteoarthritis; Osteomyelitis - fingers Negative for: Rheumatoid Arthritis Past Medical History Notes: Charcot Joint right foot Neurologic Medical History: Positive for: Neuropathy Negative for: Dementia; Quadriplegia; Paraplegia; Seizure Disorder Oncologic Medical History: Negative for: Received Chemotherapy; Received Radiation Psychiatric Medical History: Negative for: Anorexia/bulimia; Confinement Anxiety Immunizations Pneumococcal Vaccine: Received Pneumococcal Vaccination: No Immunization  Notes: last tetanus shot 6 years ago Implantable Devices No devices added Hospitalization / Surgery History Type of Hospitalization/Surgery left ankle reconstruction left ankle reconstruction left foot surgery (charcots) right knee septic right knee replacement left knee replacement 11/05/2022 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/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 IandD abscess 09/2017 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 right ankle reconstruction x3 surgeries 12/23 Family and Social History Cancer: Yes - Mother; Diabetes: Yes - Mother; Heart Disease: No; Hereditary Spherocytosis: No; Hypertension: No; Kidney Disease: No; Lung Disease: No; Seizures: No; Stroke: Yes - Father; Thyroid Problems: No; Tuberculosis: No; Never smoker; Marital Status - Married; Alcohol Use: Rarely; Drug Use: No History; Caffeine Use: Rarely - coke; Financial Concerns: No; Food, Clothing or Shelter Needs: No; Support System Lacking: No; Transportation Concerns: No [name], [name] ([id number]) [phone].pdf Page 9 of 9 Electronic Signature(s) Signed: 12/08/2022 4:37:08 PM By: Geralyn Corwin DO Entered By: Geralyn Corwin on 12/08/2022 16:09:19 -------------------------------------------------------------------------------- SuperBill Details Patient Name: Date of Service: [name]. 12/08/2022 Medical Record Number: [id number] Patient Account Number: [id number] Date of Birth/Sex: Treating RN: March 05, 1966 (57 y.o. 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 Facility Procedures : CPT4 Code: [id number] Description: 97597 - DEBRIDE WOUND 1ST 20 SQ CM OR < ICD-10 Diagnosis Description E11.621 Type 2 diabetes mellitus with foot ulcer L97.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

## 2022-12-08 NOTE — Progress Notes (Signed)
SHANDRIA, RADICH Park ([id number]) [id number] Page 1 of 9 Visit Report for 12/08/2022 Arrival Information Details Patient Name: Date of Service: [name]. 12/08/2022 3:00 PM Medical Record Number: [id number] Patient Account Number: [id number] Date of Birth/Sex: Treating RN: [birth year] (57 y.o. Caro Hight, Ladona Ridgel Primary Care Espen Bethel: Antony Haste Other Clinician: Referring Bryttney Netzer: Treating Nakeya Adinolfi/Extender: Avelina Laine in Treatment: 2 Visit Information History Since Last Visit Added or deleted any medications: No Patient Arrived: Ambulatory Any new allergies or adverse reactions: No Arrival Time: 15:09 Had a fall or experienced change in No Accompanied By: self activities of daily living that may affect Transfer Assistance: None risk of falls: Patient Identification Verified: Yes Signs or symptoms of abuse/neglect since last visito No Secondary Verification Process Completed: Yes Hospitalized since last visit: No Patient Requires Transmission-Based Precautions: No Implantable device outside of the clinic excluding No Patient Has Alerts: No cellular tissue based products placed in the center since last visit: Has Dressing in Place as Prescribed: Yes Pain Present Now: Yes Electronic Signature(s) Signed: 12/08/2022 4:28:55 PM By: Samuella Bruin Entered By: Samuella Bruin on 12/08/2022 15:10:14 -------------------------------------------------------------------------------- Encounter Discharge Information Details Patient Name: Date of Service: [name]. 12/08/2022 3:00 PM Medical Record Number: [id number] Patient Account Number: [id number] Date of Birth/Sex: Treating RN: 1966-03-04 (57 y.o. Fredderick Phenix Primary Care Donyea Beverlin: Antony Haste Other Clinician: Referring Eduardo Honor: Treating Damonie Furney/Extender: Avelina Laine in Treatment: 2 Encounter Discharge  Information Items Post Procedure Vitals Discharge Condition: Stable Temperature (F): 98.1 Ambulatory Status: Ambulatory Pulse (bpm): 93 Discharge Destination: Home Respiratory Rate (breaths/min): 18 Transportation: Private Auto Blood Pressure (mmHg): 132/75 Accompanied By: self Schedule Follow-up Appointment: Yes Clinical Summary of Care: Patient Declined Electronic Signature(s) Signed: 12/08/2022 4:28:55 PM By: Samuella Bruin Entered By: Samuella Bruin on 12/08/2022 15:42:47 [name], [name] ([id number]) [id number] Page 2 of 9 -------------------------------------------------------------------------------- Lower Extremity Assessment Details Patient Name: Date of Service: [name]. 12/08/2022 3:00 PM Medical Record Number: [id number] Patient Account Number: [id number] Date of Birth/Sex: Treating RN: Nov 28, 1965 (57 y.o. Fredderick Phenix Primary Care Imoni Kohen: Antony Haste Other Clinician: Referring Marcheta Horsey: Treating Ronasia Isola/Extender: Avelina Laine in Treatment: 2 Edema Assessment Assessed: [name]: No] [name]: No] Edema: [Left: Ye] [Right: s] Calf Left: Right: Point of Measurement: 34.5 cm From Medial Instep 41.8 cm Ankle Left: Right: Point of Measurement: 12.5 cm From Medial Instep 25 cm Vascular Assessment Pulses: Dorsalis Pedis Palpable: [Right:Yes] Extremity colors, hair growth, and conditions: Extremity Color: [Right:Normal] Hair Growth on Extremity: [Right:No] Temperature of Extremity: [Right:Warm] Capillary Refill: [Right:< 3 seconds] Dependent Rubor: [Right:No No] Electronic Signature(s) Signed: 12/08/2022 4:28:55 PM By: Samuella Bruin Entered By: Samuella Bruin on 12/08/2022 15:11:51 -------------------------------------------------------------------------------- Multi Wound Chart Details Patient Name: Date of Service: [name]. 12/08/2022 3:00 PM Medical  Record Number: [id number] Patient Account Number: [id number] Date of Birth/Sex: Treating RN: 14-Aug-1965 (57 y.o. F) Primary Care Linell Meldrum: Antony Haste Other Clinician: Referring Toye Rouillard: Treating Tashanda Fuhrer/Extender: Avelina Laine in Treatment: 2 Vital Signs Height(in): 64 Pulse(bpm): 93 Weight(lbs): 261 Blood Pressure(mmHg): 132/75 Body Mass Index(BMI): 44.8 Temperature(F): 98.1 Respiratory Rate(breaths/min): 18 [4:Photos:] [N/A:N/A] Right, Lateral Ankle N/A N/A Wound Location: Trauma N/A N/A Wounding Event: Diabetic Wound/Ulcer of the Lower N/A N/A Primary Etiology: Extremity Anemia, Sleep Apnea, Type II N/A N/A Comorbid History: Diabetes, Gout, Osteoarthritis, Osteomyelitis, Neuropathy 07/27/2022 N/A N/A Date Acquired: 2 N/A N/A Weeks of Treatment: Open N/A N/A Wound Status: No  N/A N/A Wound Recurrence: Yes N/A N/A Pending A mputation on Presentation: 2.4x1.6x0.2 N/A N/A Measurements L x W x D (cm) 3.016 N/A N/A A (cm) : rea 0.603 N/A N/A Volume (cm) : -4.40% N/A N/A % Reduction in A rea: -4.30% N/A N/A % Reduction in Volume: Grade 1 N/A N/A Classification: Medium N/A N/A Exudate A mount: Serosanguineous N/A N/A Exudate Type: red, brown N/A N/A Exudate Color: Distinct, outline attached N/A N/A Wound Margin: Small (1-33%) N/A N/A Granulation A mount: Pink N/A N/A Granulation Quality: Large (67-100%) N/A N/A Necrotic A mount: Fat Layer (Subcutaneous Tissue): Yes N/A N/A Exposed Structures: Fascia: No Tendon: No Muscle: No Joint: No Bone: No Small (1-33%) N/A N/A Epithelialization: Debridement - Selective/Open Wound N/A N/A Debridement: Pre-procedure Verification/Time Out 15:26 N/A N/A Taken: Lidocaine 5% topical ointment N/A N/A Pain Control: Slough N/A N/A Tissue Debrided: Non-Viable Tissue N/A N/A Level: 3.01 N/A N/A Debridement A (sq cm): rea Curette N/A N/A Instrument: Minimum N/A  N/A Bleeding: Pressure N/A N/A Hemostasis A chieved: Procedure was tolerated well N/A N/A Debridement Treatment Response: 2.4x1.6x0.2 N/A N/A Post Debridement Measurements L x W x D (cm) 0.603 N/A N/A Post Debridement Volume: (cm) Excoriation: No N/A N/A Periwound Skin Texture: Induration: No Callus: No Crepitus: No Rash: No Scarring: No Maceration: No N/A N/A Periwound Skin Moisture: Dry/Scaly: No Erythema: Yes N/A N/A Periwound Skin Color: Atrophie Blanche: No Cyanosis: No Ecchymosis: No Hemosiderin Staining: No Mottled: No Pallor: No Rubor: No Circumferential N/A N/A Erythema Location: No Abnormality N/A N/A Temperature: Debridement N/A N/A Procedures Performed: Treatment Notes Wound #4 (Ankle) Wound Laterality: Right, Lateral Cleanser Vashe 5.8 (oz) Discharge Instruction: Cleanse the wound with Vashe prior to applying a clean dressing using gauze sponges, not tissue or cotton balls. Peri-Wound Care [name], [name] ([id number]) [id number] Page 4 of 9 Topical Primary Dressing Hydrofera Blue Ready Transfer Foam, 2.5x2.5 (in/in) Discharge Instruction: Apply directly to wound bed as directed Santyl Ointment Discharge Instruction: Apply nickel thick amount to wound bed as instructed Secondary Dressing Woven Gauze Sponges 2x2 in Discharge Instruction: Apply over primary dressing as directed. Zetuvit Plus Silicone Border Dressing 4x4 (in/in) Discharge Instruction: Apply silicone border over primary dressing as directed. Secured With Compression Wrap tubigrip size D one layer Discharge Instruction: apply in the morning and remove at night. Compression Stockings Add-Ons Electronic Signature(s) Signed: 12/08/2022 4:37:08 PM By: Geralyn Corwin DO Entered By: Geralyn Corwin on 12/08/2022 16:08:15 -------------------------------------------------------------------------------- Multi-Disciplinary Care Plan Details Patient Name:  Date of Service: [name]. 12/08/2022 3:00 PM Medical Record Number: [id number] Patient Account Number: [id number] Date of Birth/Sex: Treating RN: 1966-04-12 (56 y.o. Fredderick Phenix Primary Care Adryen Cookson: Antony Haste Other Clinician: Referring Cheyenna Pankowski: Treating Camilo Mander/Extender: Avelina Laine in Treatment: 2 Active Inactive Necrotic Tissue Nursing Diagnoses: Knowledge deficit related to management of necrotic/devitalized tissue Goals: Necrotic/devitalized tissue will be minimized in the wound bed Date Initiated: 11/23/2022 Target Resolution Date: 12/25/2022 Goal Status: Active Patient/caregiver will verbalize understanding of reason and process for debridement of necrotic tissue Date Initiated: 11/23/2022 Target Resolution Date: 12/25/2022 Goal Status: Active Interventions: Assess patient pain level pre-, during and post procedure and prior to discharge Provide education on necrotic tissue and debridement process Treatment Activities: Biologic debridement : 11/23/2022 Excisional debridement : 11/23/2022 Notes: Nutrition Kingsley, Iman Park ([id number]) [phone].pdf Page 5 of 9 Nursing Diagnoses: Potential for alteratiion in Nutrition/Potential for imbalanced nutrition Goals: Patient/caregiver agrees to and verbalizes understanding of need to obtain nutritional consultation Date Initiated:  11/23/2022 Target Resolution Date: 12/25/2022 Goal Status: Active Patient/caregiver will maintain therapeutic glucose control Date Initiated: 11/23/2022 Target Resolution Date: 12/25/2022 Goal Status: Active Interventions: Assess HgA1c results as ordered upon admission and as needed Provide education on elevated blood sugars and impact on wound healing Provide education on nutrition Treatment Activities: Obtain HgA1c : 11/23/2022 Patient referred to Primary Care Physician for further nutritional evaluation :  11/23/2022 Notes: Pain, Acute or Chronic Nursing Diagnoses: Potential alteration in comfort, pain Goals: Patient will verbalize adequate pain control and receive pain control interventions during procedures as needed Date Initiated: 11/23/2022 Target Resolution Date: 12/25/2022 Goal Status: Active Patient/caregiver will verbalize comfort level met Date Initiated: 11/23/2022 Target Resolution Date: 12/25/2022 Goal Status: Active Interventions: Encourage patient to take pain medications as prescribed Provide education on pain management Reposition patient for comfort Treatment Activities: Administer pain control measures as ordered : 11/23/2022 Notes: Wound/Skin Impairment Nursing Diagnoses: Knowledge deficit related to ulceration/compromised skin integrity Goals: Ulcer/skin breakdown will heal within 14 weeks Date Initiated: 11/23/2022 Target Resolution Date: 02/18/2023 Goal Status: Active Interventions: Assess patient/caregiver ability to perform ulcer/skin care regimen upon admission and as needed Assess ulceration(s) every visit Provide education on ulcer and skin care Treatment Activities: Skin care regimen initiated : 11/23/2022 Topical wound management initiated : 11/23/2022 Notes: Electronic Signature(s) Signed: 12/08/2022 4:28:55 PM By: Samuella Bruin Entered By: Samuella Bruin on 12/08/2022 15:18:36 [name], [name] ([id number]) [id number] Page 6 of 9 -------------------------------------------------------------------------------- Pain Assessment Details Patient Name: Date of Service: [name]. 12/08/2022 3:00 PM Medical Record Number: [id number] Patient Account Number: [id number] Date of Birth/Sex: Treating RN: 01-18-1966 (57 y.o. Fredderick Phenix Primary Care Kynzlie Hilleary: Antony Haste Other Clinician: Referring Marilynn Ekstein: Treating Antwoine Zorn/Extender: Avelina Laine in Treatment: 2 Active  Problems Location of Pain Severity and Description of Pain Patient Has Paino Yes Site Locations Pain Location: Pain in Ulcers Duration of the Pain. Constant / Intermittento Intermittent Rate the pain. Current Pain Level: 4 Character of Pain Describe the Pain: Throbbing Pain Management and Medication Current Pain Management: Electronic Signature(s) Signed: 12/08/2022 4:28:55 PM By: Samuella Bruin Entered By: Samuella Bruin on 12/08/2022 15:10:51 -------------------------------------------------------------------------------- Patient/Caregiver Education Details Patient Name: Date of Service: [name]. 8/13/2024andnbsp3:00 PM Medical Record Number: [id number] Patient Account Number: [id number] Date of Birth/Gender: Treating RN: [birth year] (57 y.o. Fredderick Phenix Primary Care Physician: Antony Haste Other Clinician: Referring Physician: Treating Physician/Extender: Avelina Laine in Treatment: 2 Education Assessment Education Provided To: Patient Education Topics Provided Wound/Skin ImpairmentVALIYAH, [name] ([id number]) [id number] Page 7 of 9 Methods: Explain/Verbal Responses: Reinforcements needed, State content correctly Electronic Signature(s) Signed: 12/08/2022 4:28:55 PM By: Samuella Bruin Entered By: Samuella Bruin on 12/08/2022 15:18:48 -------------------------------------------------------------------------------- Wound Assessment Details Patient Name: Date of Service: [name]. 12/08/2022 3:00 PM Medical Record Number: [id number] Patient Account Number: [id number] Date of Birth/Sex: Treating RN: [birth year] (57 y.o. Fredderick Phenix Primary Care Eyana Stolze: Antony Haste Other Clinician: Referring Ernst Cumpston: Treating Domanick Cuccia/Extender: Avelina Laine in Treatment: 2 Wound Status Wound Number: 4 Primary Diabetic Wound/Ulcer of the  Lower Extremity Etiology: Wound Location: Right, Lateral Ankle Wound Open Wounding Event: Trauma Status: Date Acquired: 07/27/2022 Comorbid Anemia, Sleep Apnea, Type II Diabetes, Gout, Osteoarthritis, Weeks Of Treatment: 2 History: Osteomyelitis, Neuropathy Clustered Wound: No Pending Amputation On Presentation Photos Wound Measurements Length: (cm) 2.4 Width: (cm) 1.6 Depth: (cm) 0.2 Area: (cm) 3.016 Volume: (cm) 0.603 % Reduction in Area: -4.4% % Reduction  in Volume: -4.3% Epithelialization: Small (1-33%) Tunneling: No Undermining: No Wound Description Classification: Grade 1 Wound Margin: Distinct, outline attached Exudate Amount: Medium Exudate Type: Serosanguineous Exudate Color: red, brown Foul Odor After Cleansing: No Slough/Fibrino Yes Wound Bed Granulation Amount: Small (1-33%) Exposed Structure Granulation Quality: Pink Fascia Exposed: No Necrotic Amount: Large (67-100%) Fat Layer (Subcutaneous Tissue) Exposed: Yes Necrotic Quality: Adherent Slough Tendon Exposed: No Muscle Exposed: No Joint Exposed: No Bone Exposed: No Periwound Skin Texture Texture Color Valdivia, [name] ([id number]) [id number] Page 8 of 9 No Abnormalities Noted: No No Abnormalities Noted: No Callus: No Atrophie Blanche: No Crepitus: No Cyanosis: No Excoriation: No Ecchymosis: No Induration: No Erythema: Yes Rash: No Erythema Location: Circumferential Scarring: No Hemosiderin Staining: No Mottled: No Moisture Pallor: No No Abnormalities Noted: No Rubor: No Dry / Scaly: No Maceration: No Temperature / Pain Temperature: No Abnormality Treatment Notes Wound #4 (Ankle) Wound Laterality: Right, Lateral Cleanser Vashe 5.8 (oz) Discharge Instruction: Cleanse the wound with Vashe prior to applying a clean dressing using gauze sponges, not tissue or cotton balls. Peri-Wound Care Topical Primary Dressing Hydrofera Blue Ready Transfer Foam, 2.5x2.5  (in/in) Discharge Instruction: Apply directly to wound bed as directed Santyl Ointment Discharge Instruction: Apply nickel thick amount to wound bed as instructed Secondary Dressing Woven Gauze Sponges 2x2 in Discharge Instruction: Apply over primary dressing as directed. Zetuvit Plus Silicone Border Dressing 4x4 (in/in) Discharge Instruction: Apply silicone border over primary dressing as directed. Secured With Compression Wrap tubigrip size D one layer Discharge Instruction: apply in the morning and remove at night. Compression Stockings Add-Ons Electronic Signature(s) Signed: 12/08/2022 4:28:55 PM By: Samuella Bruin Entered By: Samuella Bruin on 12/08/2022 15:14:36 -------------------------------------------------------------------------------- Vitals Details Patient Name: Date of Service: [name]. 12/08/2022 3:00 PM Medical Record Number: [id number] Patient Account Number: [id number] Date of Birth/Sex: Treating RN: [birth year] (57 y.o. Fredderick Phenix Primary Care Ellyssa Zagal: Antony Haste Other Clinician: Referring Kirstina Leinweber: Treating Salvator Seppala/Extender: Avelina Laine in Treatment: 2 Vital Signs Time Taken: 15:10 Temperature (F): 98.1 Height (in): 64 Pulse (bpm): 93 Weight (lbs): 261 Respiratory Rate (breaths/min): 18 Body Mass Index (BMI): 44.8 Blood Pressure (mmHg): 132/75 Reference Range: 80 - 120 mg / dl [name], [name] ([id number]) [phone].pdf Page 9 of 9 Electronic Signature(s) Signed: 12/08/2022 4:28:55 PM By: Samuella Bruin Entered By: Samuella Bruin on 12/08/2022 15:10:32

## 2022-12-15 ENCOUNTER — Ambulatory Visit (HOSPITAL_BASED_OUTPATIENT_CLINIC_OR_DEPARTMENT_OTHER): Payer: Commercial Managed Care - HMO | Admitting: Internal Medicine

## 2022-12-16 ENCOUNTER — Ambulatory Visit: Admission: RE | Admit: 2022-12-16 | Payer: Commercial Managed Care - HMO | Source: Ambulatory Visit

## 2022-12-16 ENCOUNTER — Other Ambulatory Visit: Payer: Self-pay | Admitting: Internal Medicine

## 2022-12-16 DIAGNOSIS — E11621 Type 2 diabetes mellitus with foot ulcer: Secondary | ICD-10-CM

## 2022-12-16 MED ORDER — GADOPICLENOL 0.5 MMOL/ML IV SOLN: 10.0000 mL | Freq: Once | INTRAVENOUS | Status: DC | PRN

## 2022-12-22 ENCOUNTER — Encounter (HOSPITAL_BASED_OUTPATIENT_CLINIC_OR_DEPARTMENT_OTHER): Payer: Commercial Managed Care - HMO | Admitting: Internal Medicine

## 2022-12-22 DIAGNOSIS — L97518 Non-pressure chronic ulcer of other part of right foot with other specified severity: Secondary | ICD-10-CM

## 2022-12-22 DIAGNOSIS — E11621 Type 2 diabetes mellitus with foot ulcer: Secondary | ICD-10-CM | POA: Diagnosis not present

## 2022-12-23 NOTE — Progress Notes (Signed)
Park, [name] R ([id number]) [id number] Page 1 of 9 Visit Report for 12/22/2022 Chief Complaint Document Details Patient Name: Date of Service: [name] Draft NDRA R. 12/22/2022 3:00 PM Medical Record Number: [id number] Patient Account Number: [id number] Date of Birth/Sex: Treating RN: 14-Oct-1965 (57 y.o. F) Primary Care Provider: Antony Haste Other Clinician: Referring Provider: Treating Provider/Extender: Avelina Laine in Treatment: 4 Information Obtained from: Patient Chief Complaint 11/23/2022; right lateral ankle wound Electronic Signature(s) Signed: 12/22/2022 4:43:58 PM By: Geralyn Corwin DO Entered By: Geralyn Corwin on 12/22/2022 16:22:30 -------------------------------------------------------------------------------- Debridement Details Patient Name: Date of Service: [name] Draft NDRA R. 12/22/2022 3:00 PM Medical Record Number: [id number] Patient Account Number: [id number] Date of Birth/Sex: Treating RN: 1965-05-08 (57 y.o. [name], Millard.Loa Primary Care Provider: Antony Haste Other Clinician: Referring Provider: Treating Provider/Extender: Avelina Laine in Treatment: 4 Debridement Performed for Assessment: Wound #4 Right,Lateral Ankle Performed By: Physician Geralyn Corwin, DO Debridement Type: Debridement Severity of Tissue Pre Debridement: Fat layer exposed Level of Consciousness (Pre-procedure): Awake and Alert Pre-procedure Verification/Time Out Yes - 15:30 Taken: Start Time: 15:31 Pain Control: Lidocaine 4% T opical Solution Percent of Wound Bed Debrided: 100% T Area Debrided (cm): otal 3.13 Tissue and other material debrided: Viable, Non-Viable, Slough, Subcutaneous, Skin: Dermis , Skin: Epidermis, Slough Level: Skin/Subcutaneous Tissue Debridement Description: Excisional Instrument: Curette Bleeding: Minimum Hemostasis Achieved: Pressure End Time: 15:35 Procedural  Pain: 0 Post Procedural Pain: 0 Response to Treatment: Procedure was tolerated well Level of Consciousness (Post- Awake and Alert procedure): Post Debridement Measurements of Total Wound Length: (cm) 2.1 Width: (cm) 1.9 Depth: (cm) 0.2 Volume: (cm) 0.627 Character of Wound/Ulcer Post Debridement: Improved Park, [name] R ([id number]) [id number] Page 2 of 9 Severity of Tissue Post Debridement: Fat layer exposed Post Procedure Diagnosis Same as Pre-procedure Electronic Signature(s) Signed: 12/22/2022 4:43:58 PM By: Geralyn Corwin DO Signed: 12/22/2022 5:33:37 PM By: Shawn Stall RN, BSN Entered By: Shawn Stall on 12/22/2022 15:35:50 -------------------------------------------------------------------------------- HPI Details Patient Name: Date of Service: [name] Draft NDRA R. 12/22/2022 3:00 PM Medical Record Number: [id number] Patient Account Number: [id number] Date of Birth/Sex: Treating RN: 1965-06-04 (57 y.o. F) Primary Care Provider: Antony Haste Other Clinician: Referring Provider: Treating Provider/Extender: Avelina Laine in Treatment: 4 History of Present Illness HPI Description: ABI on Right: 1.01 Ms. 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 Most recently she had an amputation of the right ring finger. She presents today with a wound to the right lateral ankle that she has had for 3 months that was caused by her hitting the area with her wheelchair. She has been Vashe dressings to clean the wound bed followed by antibiotic ointment. ABIs in office are 1.01 on the left and she has palpable dorsalis pedis and posterior tibial artery pulses. Her foot is warm and well-perfused. 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 She has been cleaning the area with  Vashe and using Neosporin to the wound bed. She currently denies signs of infection. Patient has a history of ankle surgery on 04-07-22 with right partial hardware removal and revision subtalar fusion. 8/6; patient presents for follow-up. She has been using Santyl to the wound bed and has been taking Augmentin and doxycycline without issues. She is scheduled for her MRI towards the end of the month. She  has no issues or complaints. 8/13; patient presents for follow-up. She is scheduled for MRI next week. She has been using Santyl and Hydrofera Blue to the wound bed. She is taking Augmentin and doxycycline without issues. She has been on her feet more recently and has noted slightly more swelling to the ankle and more erythema. She denies systemic signs of infection. 8/27; patient presents for follow-up. She is completed 4 weeks of oral antibiotics. She obtained her MRI of the right ankle however this has not been read yet. She denies signs of infection. Wound is smaller. She has been using Santyl and Hydrofera Blue. Electronic Signature(s) Signed: 12/22/2022 4:43:58 PM By: Geralyn Corwin DO Entered By: Geralyn Corwin on 12/22/2022 16:23:04 -------------------------------------------------------------------------------- Physical Exam Details Patient Name: Date of Service: [name] NDRA R. 12/22/2022 3:00 PM Medical Record Number: [id number] Patient Account Number: [id number] Date of Birth/Sex: Treating RN: Nov 28, 1965 (57 y.o. F) Primary Care Provider: Antony Haste Other Clinician: Referring Provider: Treating Provider/Extender: Avelina Laine in Treatment: 4 Constitutional respirations regular, non-labored and within target range for patient.. Cardiovascular 2+ dorsalis pedis/posterior tibialis pulses. Psychiatric [place], [name] ([id number]) [id number] Page 3 of 9 pleasant and cooperative. Notes Right foot: T the lateral  ankle there is an open wound with granulation tissue and nonviable tissue at the wound bed. No purulent drainage. 2+ pitting edema to o the knee. No signs of surrounding infection including increased warmth, erythema or purulent drainage. Electronic Signature(s) Signed: 12/22/2022 4:43:58 PM By: Geralyn Corwin DO Entered By: Geralyn Corwin on 12/22/2022 16:23:30 -------------------------------------------------------------------------------- Physician Orders Details Patient Name: Date of Service: [name] Draft NDRA R. 12/22/2022 3:00 PM Medical Record Number: [id number] Patient Account Number: [id number] Date of Birth/Sex: Treating RN: [birth year] (57 y.o. 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 - Dr. Mikey Bussing 12/29/2022 230pm Return A ppointment in 2 weeks. - Dr. Mikey Bussing 01/05/2023 300pm Return A Return appointment in 3 weeks. - Dr. Mikey Bussing (please schedule up front) Other: - Oral antibiotics from your pharmacy- continue the antibiotics-= finish out abx. Purchase compression stockings will provide with leg measurements and elastic therapy info if you would like to purchase from them. [facility] wound care supplies. Anesthetic (In clinic) Topical Lidocaine 5% applied to wound bed Bathing/ Shower/ Hygiene May shower with protection but do not get wound dressing(s) wet. Protect dressing(s) with water repellant cover (for example, large plastic bag) or a cast  cover and may then take shower. Edema Control - Lymphedema / SCD / Other Elevate legs to the level of the heart or above for 30 minutes daily and/or when sitting for 3-4 times a day throughout the day. Avoid standing for long periods of time. Compression stocking or Garment 10-20 mm/Hg pressure to: - purchase apply in the morning and remove at night. use tubigrip size D until those arrive. Off-Loading Other: - bunny boot- purchase off of [employer] to protect ankle while sleeping at time. Wound Treatment Wound #4 - Ankle Wound Laterality: Right, Lateral Cleanser: Vashe 5.8 (oz) 1 x Per Day/30 Days Discharge Instructions: Cleanse the wound  with Vashe prior to applying a clean dressing using gauze sponges, not tissue or cotton balls. Prim Dressing: Hydrofera Blue Ready Transfer Foam, 2.5x2.5 (in/in) (Generic) 1 x Per Day/30 Days ary Discharge Instructions: Apply directly to wound bed as directed Prim Dressing: Santyl Ointment 1 x Per Day/30 Days ary Discharge Instructions: Apply nickel thick amount to wound bed as instructed Secondary Dressing: Woven Gauze Sponges 2x2 in (Generic) 1 x Per Day/30 Days Discharge Instructions: Apply over primary dressing as directed. Secondary Dressing: Zetuvit Plus Silicone Border Dressing 4x4 (in/in) (Generic) 1 x Per Day/30 Days Hoelzel, Carmelita R ([id number]) [phone].pdf Page 4 of 9 Discharge Instructions: Apply silicone border over primary dressing as directed. Compression Wrap: tubigrip size D one layer 1 x Per Day/30 Days Discharge Instructions: apply in the morning and remove at night. Electronic Signature(s) Signed: 12/22/2022 4:43:58 PM By: Geralyn Corwin DO Entered By: Geralyn Corwin on 12/22/2022 16:24:41 -------------------------------------------------------------------------------- Problem List Details Patient Name: Date of Service: [name] Draft NDRA R. 12/22/2022 3:00 PM Medical Record Number: [id number] Patient  Account Number: [id number] Date of Birth/Sex: Treating RN: 12-03-1965 (57 y.o. 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 12/22/2022 3:00 PM Medical Record Number: [id number] Patient Account Number: [id number] Date of Birth/Sex: Treating RN: [birth year] (57 y.o. 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 Most recently she had an amputation of the right ring finger. She presents today with a wound to the right lateral ankle that she has had for 3 months that was caused by her hitting  the area with her wheelchair. She has been Vashe dressings to clean the wound bed followed by antibiotic ointment. ABIs in office are 1.01 on the left and she has palpable dorsalis pedis and posterior tibial artery pulses. Her foot is warm and well-perfused. 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 She has been cleaning the area with Vashe and using Neosporin to the wound bed. She currently denies signs of infection. Patient has a history of ankle surgery on 04-07-22 with right partial hardware removal and revision subtalar fusion. 8/6; patient presents for follow-up. She has been using Santyl to the wound bed and has been taking Augmentin and doxycycline without issues. She is scheduled for her MRI towards the end of the month. She has no issues or complaints. 8/13; patient presents for follow-up. She is scheduled for MRI next week. She has been  using Santyl and Hydrofera Blue to the wound bed. She is taking Augmentin and doxycycline without issues. She has been on her feet more recently and has noted slightly more swelling to the ankle and more erythema. She denies systemic signs of infection. 8/27; patient presents for follow-up. She is completed 4 weeks of oral antibiotics. She obtained her MRI of the right ankle however this has not been read yet. She denies signs of infection. Wound is smaller. She has been using Santyl and Hydrofera Blue. Patient History Family History Cancer - Mother, Diabetes - Mother, Stroke - Father, No family history of Heart Disease, Hereditary Spherocytosis, Hypertension, Kidney Disease, Lung Disease, Seizures, Thyroid Problems, Tuberculosis. Social History Never smoker, Marital Status - Married, Alcohol Use - Rarely, Drug Use - No History, Caffeine Use - Rarely - coke. Medical History Eyes Denies history of Cataracts, Glaucoma, Optic Neuritis Ear/Nose/Mouth/Throat Denies history of Chronic sinus problems/congestion, Middle ear  problems Hematologic/Lymphatic Patient has history of Anemia Denies history of Hemophilia, Human Immunodeficiency Virus, Lymphedema, Sickle Cell Disease Respiratory Patient has history of Sleep Apnea Denies history of Aspiration, Asthma, Chronic Obstructive Pulmonary Disease (COPD), Pneumothorax, Tuberculosis Cardiovascular Denies history of Angina, Arrhythmia, Congestive Heart Failure, Coronary Artery Disease, Deep Vein Thrombosis, Hypertension, Hypotension, Myocardial Infarction, Peripheral Arterial Disease, Peripheral Venous Disease, Phlebitis, Vasculitis Gastrointestinal Denies history of Cirrhosis , Colitis, Crohns, Hepatitis A, Hepatitis B, Hepatitis C Endocrine Patient has history of Type II Diabetes - 3/24 HgbA1c 12.2 Denies history of Type I Diabetes Genitourinary Denies history of End Stage Renal Disease Immunological Denies history of Lupus Erythematosus, Raynauds, Scleroderma Integumentary (Skin) Denies history of History of Burn Musculoskeletal Patient has history of Gout, Osteoarthritis, Osteomyelitis - fingers Denies history of Rheumatoid Arthritis Neurologic Patient has history of Neuropathy Denies history of Dementia, Quadriplegia, Paraplegia, Seizure Disorder Oncologic Denies history of Received Chemotherapy, Received Radiation Psychiatric Denies history of Anorexia/bulimia, Confinement Anxiety Hospitalization/Surgery History - left ankle reconstruction. - left ankle reconstruction. - left foot surgery (charcots). - right knee septic. - right knee replacement. - left knee replacement. - 11/05/2022 right ring finger amputation. - 07/02/2022 left middle finger amputation. - 08/03/2020 IandD abscess. - 09/2017 L BKA. - 2/19 right finger amputation. - right ankle reconstruction x3 surgeries 12/23. Medical A Surgical History Notes nd Respiratory acute bacterial bronchitis 05/16/2022 RSV Gastrointestinal GERD Musculoskeletal Charcot Joint right foot Antonucci, Anyah R  ([id number]) [id number] Page 6 of 9 Objective Constitutional respirations regular, non-labored and within target range for patient.. Vitals Time Taken: 3:22 PM, Height: 64 in, Weight: 261 lbs, BMI: 44.8, Temperature: 98.9 F, Pulse: 96 bpm, Respiratory Rate: 20 breaths/min, Blood Pressure: 116/75 mmHg. Cardiovascular 2+ dorsalis pedis/posterior tibialis pulses. Psychiatric pleasant and cooperative. General Notes: Right foot: T the lateral ankle there is an open wound with granulation tissue and nonviable tissue at the wound bed. No purulent drainage. 2+ o pitting edema to the knee. No signs of surrounding infection including increased warmth, erythema or purulent drainage. Integumentary (Hair, Skin) Wound #4 status is Open. Original cause of wound was Trauma. The date acquired was: 07/27/2022. The wound has been in treatment 4 weeks. The wound is located on the Right,Lateral Ankle. The wound measures 2.1cm length x 1.9cm width x 0.2cm depth; 3.134cm^2 area and 0.627cm^3 volume. There is Fat Layer (Subcutaneous Tissue) exposed. There is no tunneling or undermining noted. There is a medium amount of serosanguineous drainage noted. The wound margin is distinct with the outline attached to the wound base. There is medium (34-66%) pink granulation within the  wound bed. There is a medium (34-66%) amount of necrotic tissue within the wound bed including Adherent Slough. The periwound skin appearance did not exhibit: Callus, Crepitus, Excoriation, Induration, Rash, Scarring, Dry/Scaly, Maceration, Atrophie Blanche, Cyanosis, Ecchymosis, Hemosiderin Staining, Mottled, Pallor, Rubor, Erythema. Periwound temperature was noted as No Abnormality. 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 I debrided nonviable tissue. Patient had an MRI of the right ankle however this has not been read. No signs of infection on exam. She has completed 4 weeks of oral antibiotics. No further need to extend. I did recommend at this time to start using compression stockings daily. Continue to use Santyl and Hydrofera Blue for dressing changes. Procedures Wound #4 Pre-procedure diagnosis of Wound #4 is a Diabetic Wound/Ulcer of the Lower Extremity located on the Right,Lateral Ankle .Severity of Tissue Pre Debridement is: Fat layer exposed. There was a Excisional Skin/Subcutaneous Tissue Debridement with a total area of 3.13 sq cm performed by Geralyn Corwin, DO. With the following instrument(s): Curette to remove Viable and Non-Viable tissue/material. Material removed includes Subcutaneous Tissue, Slough, Skin: Dermis, and Skin: Epidermis after achieving pain control using Lidocaine 4% Topical Solution. A time out was conducted at 15:30, prior to the start of the procedure. A Minimum amount of bleeding was controlled with Pressure. The procedure was tolerated well with a pain level of 0 throughout and a pain level of 0 following the procedure. Post Debridement Measurements: 2.1cm length x 1.9cm width x 0.2cm depth; 0.627cm^3 volume. Character of Wound/Ulcer Post Debridement is improved. Severity of Tissue Post Debridement is: Fat layer exposed. Post procedure Diagnosis Wound #4: Same as Pre-Procedure Plan Follow-up Appointments: Return Appointment in 1 week. - Dr. Mikey Bussing 12/29/2022 230pm Return Appointment in 2 weeks. - Dr. Mikey Bussing 01/05/2023 300pm Return appointment in 3 weeks. - Dr. Mikey Bussing (please schedule up front) Other: - Oral antibiotics from your pharmacy- continue the antibiotics-= finish out abx. Purchase compression stockings will provide with leg  measurements and elastic therapy info if you would like to purchase from them. [facility] wound care supplies. Anesthetic: (In clinic) Topical Lidocaine 5% applied to wound bed Bathing/ Shower/ Hygiene: May shower with protection but do not get wound dressing(s) wet. Protect dressing(s) with water repellant cover (for example, large plastic bag) or a cast cover and may then take shower. TAMICA, DUNLEAVY R ([id number]) [id number] Page 7 of 9 Edema Control - Lymphedema / SCD / Other: Elevate legs to the level of the heart or above for 30 minutes daily and/or when sitting for 3-4 times a day throughout the day. Avoid standing for long periods of time. Compression stocking or Garment 10-20 mm/Hg pressure to: - purchase apply in the morning and remove at night. use tubigrip size D until those arrive. Off-Loading: Other: - bunny boot- purchase off of [employer] to protect ankle while sleeping at time. WOUND #4: - Ankle Wound Laterality: Right, Lateral Cleanser: Vashe 5.8 (oz) 1 x Per Day/30 Days Discharge Instructions: Cleanse the wound with Vashe prior to applying a clean dressing using gauze sponges, not tissue or cotton balls. Prim Dressing: Hydrofera Blue Ready Transfer Foam, 2.5x2.5 (in/in) (Generic) 1 x Per Day/30 Days ary Discharge Instructions: Apply directly to wound bed as directed Prim Dressing: Santyl Ointment  1 x Per Day/30 Days ary Discharge Instructions: Apply nickel thick amount to wound bed as instructed Secondary Dressing: Woven Gauze Sponges 2x2 in (Generic) 1 x Per Day/30 Days Discharge Instructions: Apply over primary dressing as directed. Secondary Dressing: Zetuvit Plus Silicone Border Dressing 4x4 (in/in) (Generic) 1 x Per Day/30 Days Discharge Instructions: Apply silicone border over primary dressing as directed. Com pression Wrap: tubigrip size D one layer 1 x Per Day/30 Days Discharge Instructions: apply in the morning and remove at night. 1. In  office sharp debridement 2. Santyl and Hydrofera Blue 3. Compression stockings daily 4. Follow-up in 1 week Electronic Signature(s) Signed: 12/22/2022 4:43:58 PM By: Geralyn Corwin DO Entered By: Geralyn Corwin on 12/22/2022 16:29:15 -------------------------------------------------------------------------------- HxROS Details Patient Name: Date of Service: [name] Draft NDRA R. 12/22/2022 3:00 PM Medical Record Number: [id number] Patient Account Number: [id number] Date of Birth/Sex: Treating RN: May 20, 1965 (57 y.o. F) Primary Care Provider: Antony Haste Other Clinician: Referring Provider: Treating Provider/Extender: Avelina Laine in Treatment: 4 Eyes Medical History: Negative for: Cataracts; Glaucoma; Optic Neuritis Ear/Nose/Mouth/Throat Medical History: Negative for: Chronic sinus problems/congestion; Middle ear problems Hematologic/Lymphatic Medical History: Positive for: Anemia Negative for: Hemophilia; Human Immunodeficiency Virus; Lymphedema; Sickle Cell Disease Respiratory Medical History: Positive for: Sleep Apnea Negative for: Aspiration; Asthma; Chronic Obstructive Pulmonary Disease (COPD); Pneumothorax; Tuberculosis Past Medical History Notes: acute bacterial bronchitis 05/16/2022 RSV Cardiovascular Medical History: Negative for: Angina; Arrhythmia; Congestive Heart Failure; Coronary Artery Disease; Deep Vein Thrombosis; Hypertension; Hypotension; Myocardial Infarction; Peripheral Arterial Disease; Peripheral Venous Disease; Phlebitis; Vasculitis Wescott, Temika R ([id number]) [id number] Page 8 of 9 Gastrointestinal Medical History: Negative for: Cirrhosis ; Colitis; Crohns; Hepatitis A; Hepatitis B; Hepatitis C Past Medical History Notes: GERD Endocrine Medical History: Positive for: Type II Diabetes - 3/24 HgbA1c 12.2 Negative for: Type I Diabetes Time with diabetes: 1 year ago Treated with:  Insulin, Diet Blood sugar tested every day: Yes Tested : x3 a day Genitourinary Medical History: Negative for: End Stage Renal Disease Immunological Medical History: Negative for: Lupus Erythematosus; Raynauds; Scleroderma Integumentary (Skin) Medical History: Negative for: History of Burn Musculoskeletal Medical History: Positive for: Gout; Osteoarthritis; Osteomyelitis - fingers Negative for: Rheumatoid Arthritis Past Medical History Notes: Charcot Joint right foot Neurologic Medical History: Positive for: Neuropathy Negative for: Dementia; Quadriplegia; Paraplegia; Seizure Disorder Oncologic Medical History: Negative for: Received Chemotherapy; Received Radiation Psychiatric Medical History: Negative for: Anorexia/bulimia; Confinement Anxiety Immunizations Pneumococcal Vaccine: Received Pneumococcal Vaccination: No Immunization Notes: last tetanus shot 6 years ago Implantable Devices No devices added Hospitalization / Surgery History Type of Hospitalization/Surgery left ankle reconstruction left ankle reconstruction left foot surgery (charcots) right knee septic right knee replacement left knee replacement 11/05/2022 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/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 IandD abscess 09/2017 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 right ankle reconstruction x3 surgeries 12/23 Sheldon, Jamelle R ([id number]) [id number] Page 9 of 9 Family and Social History Cancer: Yes - Mother; Diabetes: Yes - Mother; Heart Disease: No; Hereditary Spherocytosis: No; Hypertension: No; Kidney Disease: No; Lung Disease: No; Seizures: No; Stroke: Yes - Father; Thyroid Problems: No; Tuberculosis: No; Never smoker; Marital Status - Married; Alcohol Use: Rarely; Drug Use: No History; Caffeine Use: Rarely - coke; Financial Concerns: No; Food, Clothing or Shelter Needs: No; Support System Lacking: No; Transportation Concerns: No Electronic  Signature(s) Signed: 12/22/2022 4:43:58 PM By: Geralyn Corwin DO Entered By: Geralyn Corwin on 12/22/2022 16:23:11 -------------------------------------------------------------------------------- SuperBill Details Patient Name: Date of Service: MINO R, A LEXA NDRA R. 12/22/2022 Medical  Record Number: [id number] Patient Account Number: [id number] Date of Birth/Sex: Treating RN: [birth year] (57 y.o. 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, initial encounter L97.518 Non-pressure chronic ulcer of other part of right foot with other specified severity Facility Procedures : CPT4 Code: [id number] Description: 11042 - DEB SUBQ TISSUE 20 SQ CM/< ICD-10 Diagnosis Description E11.621 Type 2 diabetes mellitus with foot ulcer L97.518 Non-pressure chronic ulcer of other part of right foot with other specified sev Modifier: erity Quantity: 1 Physician Procedures : CPT4 Code Description Modifier [id number] 11042 - WC PHYS SUBQ TISS 20 SQ CM ICD-10 Diagnosis Description E11.621 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

## 2022-12-23 NOTE — Progress Notes (Signed)
[name], [name] Park ([id number]) [id number] Page 1 of 9 Visit Report for 12/22/2022 Arrival Information Details Patient Name: Date of Service: [name]. 12/22/2022 3:00 PM Medical Record Number: [id number] Patient Account Number: [id number] Date of Birth/Sex: Treating RN: May 13, 1965 (57 y.o. [name], Millard.Loa Primary Care [name]: [name] Other Clinician: Referring [name]: Treating [name]/Extender: [name] in Treatment: 4 Visit Information History Since Last Visit Added or deleted any medications: Yes Patient Arrived: Ambulatory Any new allergies or adverse reactions: No Arrival Time: 15:20 Had a fall or experienced change in No Accompanied By: self activities of daily living that may affect Transfer Assistance: None risk of falls: Patient Identification Verified: Yes Signs or symptoms of abuse/neglect since last visito No Secondary Verification Process Completed: Yes Hospitalized since last visit: No Patient Requires Transmission-Based Precautions: No Implantable device outside of the clinic excluding No Patient Has Alerts: No cellular tissue based products placed in the center since last visit: Has Dressing in Place as Prescribed: Yes Pain Present Now: No Notes sunburn with sunblock. Electronic Signature(s) Signed: 12/22/2022 5:33:37 PM By: Shawn Stall RN, BSN Entered By: Shawn Stall on 12/22/2022 15:20:56 -------------------------------------------------------------------------------- Encounter Discharge Information Details Patient Name: Date of Service: [name]. 12/22/2022 3:00 PM Medical Record Number: [id number] Patient Account Number: [id number] Date of Birth/Sex: Treating RN: 02-04-1966 (57 y.o. [name] Primary Care Kaeden Mester: [name] Other Clinician: Referring [name]: Treating Erisa Mehlman/Extender: [name] in Treatment:  4 Encounter Discharge Information Items Post Procedure Vitals Discharge Condition: Stable Temperature (F): 98.9 Ambulatory Status: Ambulatory Pulse (bpm): 96 Discharge Destination: Home Respiratory Rate (breaths/min): 20 Transportation: Private Auto Blood Pressure (mmHg): 116/75 Accompanied By: self Schedule Follow-up Appointment: Yes Clinical Summary of Care: Electronic Signature(s) Signed: 12/22/2022 5:33:37 PM By: Shawn Stall RN, BSN Entered By: Shawn Stall on 12/22/2022 15:56:36 Whetsel, [name] ([id number]) [id number] Page 2 of 9 -------------------------------------------------------------------------------- Lower Extremity Assessment Details Patient Name: Date of Service: [name]. 12/22/2022 3:00 PM Medical Record Number: [id number] Patient Account Number: [id number] Date of Birth/Sex: Treating RN: Jan 20, 1966 (57 y.o. [name] Primary Care Stesha Neyens: [name] Other Clinician: Referring Abiha Lukehart: Treating [name]/Extender: [name] in Treatment: 4 Edema Assessment Assessed: [name]: No] [name]: Yes] Edema: [Left: Ye] [Right: s] Calf Left: Right: Point of Measurement: 34.5 cm From Medial Instep 41 cm Ankle Left: Right: Point of Measurement: 12.5 cm From Medial Instep 26 cm Knee To Floor Left: Right: From Medial Instep 44.5 cm Vascular Assessment Pulses: Dorsalis Pedis Palpable: [Right:Yes] Extremity colors, hair growth, and conditions: Extremity Color: [Right:Normal] Hair Growth on Extremity: [Right:No] Temperature of Extremity: [Right:Warm] Capillary Refill: [Right:< 3 seconds] Dependent Rubor: [Right:No] Blanched when Elevated: [Right:No No] Toe Nail Assessment Left: Right: Thick: No Discolored: No Deformed: No Improper Length and Hygiene: No Electronic Signature(s) Signed: 12/22/2022 5:33:37 PM By: Shawn Stall RN, BSN Entered By: Shawn Stall on 12/22/2022  15:38:55 -------------------------------------------------------------------------------- Multi Wound Chart Details Patient Name: Date of Service: [name]. 12/22/2022 3:00 PM Medical Record Number: [id number] Patient Account Number: [id number] Date of Birth/Sex: Treating RN: 1965-11-10 (57 y.o. F) Primary Care [name]: [name] Other Clinician: Referring [name]: Treating [name]/Extender: [name] in Treatment: 4 [name], [name] ([id number]) [id number] Page 3 of 9 Vital Signs Height(in): 64 Pulse(bpm): 96 Weight(lbs): 261 Blood Pressure(mmHg): 116/75 Body Mass Index(BMI): 44.8 Temperature(F): 98.9 Respiratory Rate(breaths/min): 20 [4:Photos:] [N/A:N/A] Right, Lateral Ankle N/A N/A Wound  Location: Trauma N/A N/A Wounding Event: Diabetic Wound/Ulcer of the Lower N/A N/A Primary Etiology: Extremity Anemia, Sleep Apnea, Type II N/A N/A Comorbid History: Diabetes, Gout, Osteoarthritis, Osteomyelitis, Neuropathy 07/27/2022 N/A N/A Date Acquired: 4 N/A N/A Weeks of Treatment: Open N/A N/A Wound Status: No N/A N/A Wound Recurrence: Yes N/A N/A Pending A mputation on Presentation: 2.1x1.9x0.2 N/A N/A Measurements L x W x D (cm) 3.134 N/A N/A A (cm) : rea 0.627 N/A N/A Volume (cm) : -8.40% N/A N/A % Reduction in A rea: -8.50% N/A N/A % Reduction in Volume: Grade 1 N/A N/A Classification: Medium N/A N/A Exudate A mount: Serosanguineous N/A N/A Exudate Type: red, brown N/A N/A Exudate Color: Distinct, outline attached N/A N/A Wound Margin: Medium (34-66%) N/A N/A Granulation A mount: Pink N/A N/A Granulation Quality: Medium (34-66%) N/A N/A Necrotic A mount: Fat Layer (Subcutaneous Tissue): Yes N/A N/A Exposed Structures: Fascia: No Tendon: No Muscle: No Joint: No Bone: No Small (1-33%) N/A N/A Epithelialization: Debridement - Excisional N/A  N/A Debridement: Pre-procedure Verification/Time Out 15:30 N/A N/A Taken: Lidocaine 4% Topical Solution N/A N/A Pain Control: Subcutaneous, Slough N/A N/A Tissue Debrided: Skin/Subcutaneous Tissue N/A N/A Level: 3.13 N/A N/A Debridement A (sq cm): rea Curette N/A N/A Instrument: Minimum N/A N/A Bleeding: Pressure N/A N/A Hemostasis A chieved: 0 N/A N/A Procedural Pain: 0 N/A N/A Post Procedural Pain: Procedure was tolerated well N/A N/A Debridement Treatment Response: 2.1x1.9x0.2 N/A N/A Post Debridement Measurements L x W x D (cm) 0.627 N/A N/A Post Debridement Volume: (cm) Excoriation: No N/A N/A Periwound Skin Texture: Induration: No Callus: No Crepitus: No Rash: No Scarring: No Maceration: No N/A N/A Periwound Skin Moisture: Dry/Scaly: No Atrophie Blanche: No N/A N/A Periwound Skin Color: Cyanosis: No Ecchymosis: No Erythema: No Hemosiderin Staining: No Mottled: No Pallor: No Matton, [name] ([id number]) [phone].pdf Page 4 of 9 Rubor: No No Abnormality N/A N/A Temperature: Debridement N/A N/A Procedures Performed: Treatment Notes Wound #4 (Ankle) Wound Laterality: Right, Lateral Cleanser Vashe 5.8 (oz) Discharge Instruction: Cleanse the wound with Vashe prior to applying a clean dressing using gauze sponges, not tissue or cotton balls. Peri-Wound Care Topical Primary Dressing Hydrofera Blue Ready Transfer Foam, 2.5x2.5 (in/in) Discharge Instruction: Apply directly to wound bed as directed Santyl Ointment Discharge Instruction: Apply nickel thick amount to wound bed as instructed Secondary Dressing Woven Gauze Sponges 2x2 in Discharge Instruction: Apply over primary dressing as directed. Zetuvit Plus Silicone Border Dressing 4x4 (in/in) Discharge Instruction: Apply silicone border over primary dressing as directed. Secured With Compression Wrap tubigrip size D one layer Discharge Instruction: apply in the morning  and remove at night. Compression Stockings Add-Ons Electronic Signature(s) Signed: 12/22/2022 4:43:58 PM By: Geralyn Corwin DO Entered By: Geralyn Corwin on 12/22/2022 16:22:21 -------------------------------------------------------------------------------- Multi-Disciplinary Care Plan Details Patient Name: Date of Service: [name]. 12/22/2022 3:00 PM Medical Record Number: [id number] Patient Account Number: [id number] Date of Birth/Sex: Treating RN: 08-11-1965 (57 y.o. [name] Primary Care Harlem Bula: [name] Other Clinician: Referring Shane Melby: Treating Kyran Connaughton/Extender: [name] in Treatment: 4 Active Inactive Necrotic Tissue Nursing Diagnoses: Knowledge deficit related to management of necrotic/devitalized tissue Goals: Necrotic/devitalized tissue will be minimized in the wound bed Date Initiated: 11/23/2022 Target Resolution Date: 01/22/2023 Goal Status: Active Patient/caregiver will verbalize understanding of reason and process for debridement of necrotic tissue Date Initiated: 11/23/2022 Target Resolution Date: 01/22/2023 PEARLEAN, SOLAND ([id number]) [phone].pdf Page 5 of 9 Goal Status: Active Interventions: Assess patient pain level pre-,  during and post procedure and prior to discharge Provide education on necrotic tissue and debridement process Treatment Activities: Biologic debridement : 11/23/2022 Excisional debridement : 11/23/2022 Notes: Nutrition Nursing Diagnoses: Potential for alteratiion in Nutrition/Potential for imbalanced nutrition Goals: Patient/caregiver agrees to and verbalizes understanding of need to obtain nutritional consultation Date Initiated: 11/23/2022 Date Inactivated: 12/22/2022 Target Resolution Date: 12/25/2022 Goal Status: Met Patient/caregiver will maintain therapeutic glucose control Date Initiated: 11/23/2022 Target Resolution Date: 01/22/2023 Goal  Status: Active Interventions: Assess HgA1c results as ordered upon admission and as needed Provide education on elevated blood sugars and impact on wound healing Provide education on nutrition Treatment Activities: Obtain HgA1c : 11/23/2022 Patient referred to Primary Care Physician for further nutritional evaluation : 11/23/2022 Notes: Pain, Acute or Chronic Nursing Diagnoses: Potential alteration in comfort, pain Goals: Patient will verbalize adequate pain control and receive pain control interventions during procedures as needed Date Initiated: 11/23/2022 Target Resolution Date: 01/22/2023 Goal Status: Active Patient/caregiver will verbalize comfort level met Date Initiated: 11/23/2022 Target Resolution Date: 01/22/2023 Goal Status: Active Interventions: Encourage patient to take pain medications as prescribed Provide education on pain management Reposition patient for comfort Treatment Activities: Administer pain control measures as ordered : 11/23/2022 Notes: Wound/Skin Impairment Nursing Diagnoses: Knowledge deficit related to ulceration/compromised skin integrity Goals: Ulcer/skin breakdown will heal within 14 weeks Date Initiated: 11/23/2022 Target Resolution Date: 03/25/2023 Goal Status: Active Interventions: Assess patient/caregiver ability to perform ulcer/skin care regimen upon admission and as needed Assess ulceration(s) every visit Provide education on ulcer and skin care Treatment Activities: Skin care regimen initiated : 11/23/2022 Topical wound management initiated : 11/23/2022 [name], [name] ([id number]) [id number].pdf Page 6 of 9 Notes: Electronic Signature(s) Signed: 12/22/2022 5:33:37 PM By: Shawn Stall RN, BSN Entered By: Shawn Stall on 12/22/2022 15:34:04 -------------------------------------------------------------------------------- Pain Assessment Details Patient Name: Date of Service: [name] NDRA Park. 12/22/2022  3:00 PM Medical Record Number: [id number] Patient Account Number: [id number] Date of Birth/Sex: Treating RN: 1966/02/16 (57 y.o. [name] Primary Care Novalynn Branaman: [name] Other Clinician: Referring Wilbert Hayashi: Treating Edon Hoadley/Extender: [name] in Treatment: 4 Active Problems Location of Pain Severity and Description of Pain Patient Has Paino No Site Locations Pain Management and Medication Current Pain Management: Electronic Signature(s) Signed: 12/22/2022 5:33:37 PM By: Shawn Stall RN, BSN Entered By: Shawn Stall on 12/22/2022 15:23:08 -------------------------------------------------------------------------------- Patient/Caregiver Education Details Patient Name: Date of Service: [name]. 8/27/2024andnbsp3:00 PM Medical Record Number: [id number] Patient Account Number: [id number] Date of Birth/Gender: Treating RN: [birth year] (57 y.o. [name] Primary Care Physician: [name] Other Clinician: Referring Physician: Treating Physician/Extender: [name] in Treatment: 4 Education Assessment Louderback, [name] ([id number]) [id number] Page 7 of 9 Education Provided To: Patient Education Topics Provided Wound/Skin Impairment: Handouts: Caring for Your Ulcer Methods: Explain/Verbal Responses: Reinforcements needed Electronic Signature(s) Signed: 12/22/2022 5:33:37 PM By: Shawn Stall RN, BSN Entered By: Shawn Stall on 12/22/2022 15:34:18 -------------------------------------------------------------------------------- Wound Assessment Details Patient Name: Date of Service: [name]. 12/22/2022 3:00 PM Medical Record Number: [id number] Patient Account Number: [id number] Date of Birth/Sex: Treating RN: [birth year] (57 y.o. [name] Primary Care Taliah Porche: [name] Other Clinician: Referring Hamed Debella: Treating Rhyatt Muska/Extender:  [name] in Treatment: 4 Wound Status Wound Number: 4 Primary Diabetic Wound/Ulcer of the Lower Extremity Etiology: Wound Location: Right, Lateral Ankle Wound Open Wounding Event: Trauma Status: Date Acquired: 07/27/2022 Comorbid Anemia, Sleep Apnea, Type II Diabetes, Gout, Osteoarthritis, Weeks Of  Treatment: 4 History: Osteomyelitis, Neuropathy Clustered Wound: No Pending Amputation On Presentation Photos Wound Measurements Length: (cm) 2.1 Width: (cm) 1.9 Depth: (cm) 0.2 Area: (cm) 3.134 Volume: (cm) 0.627 % Reduction in Area: -8.4% % Reduction in Volume: -8.5% Epithelialization: Small (1-33%) Tunneling: No Undermining: No Wound Description Classification: Grade 1 Wound Margin: Distinct, outline attached Exudate Amount: Medium Exudate Type: Serosanguineous Exudate Color: red, brown Foul Odor After Cleansing: No Slough/Fibrino Yes Wound Bed Granulation Amount: Medium (34-66%) Exposed Structure Granulation Quality: Pink Fascia Exposed: No Necrotic Amount: Medium (34-66%) Fat Layer (Subcutaneous Tissue) Exposed: Yes Necrotic Quality: Adherent Slough Tendon Exposed: No Burkley, [name] ([id number]) [id number] Page 8 of 9 Muscle Exposed: No Joint Exposed: No Bone Exposed: No Periwound Skin Texture Texture Color No Abnormalities Noted: No No Abnormalities Noted: No Callus: No Atrophie Blanche: No Crepitus: No Cyanosis: No Excoriation: No Ecchymosis: No Induration: No Erythema: No Rash: No Hemosiderin Staining: No Scarring: No Mottled: No Pallor: No Moisture Rubor: No No Abnormalities Noted: No Dry / Scaly: No Temperature / Pain Maceration: No Temperature: No Abnormality Treatment Notes Wound #4 (Ankle) Wound Laterality: Right, Lateral Cleanser Vashe 5.8 (oz) Discharge Instruction: Cleanse the wound with Vashe prior to applying a clean dressing using gauze sponges, not tissue or cotton  balls. Peri-Wound Care Topical Primary Dressing Hydrofera Blue Ready Transfer Foam, 2.5x2.5 (in/in) Discharge Instruction: Apply directly to wound bed as directed Santyl Ointment Discharge Instruction: Apply nickel thick amount to wound bed as instructed Secondary Dressing Woven Gauze Sponges 2x2 in Discharge Instruction: Apply over primary dressing as directed. Zetuvit Plus Silicone Border Dressing 4x4 (in/in) Discharge Instruction: Apply silicone border over primary dressing as directed. Secured With Compression Wrap tubigrip size D one layer Discharge Instruction: apply in the morning and remove at night. Compression Stockings Add-Ons Electronic Signature(s) Signed: 12/22/2022 5:33:37 PM By: Shawn Stall RN, BSN Entered By: Shawn Stall on 12/22/2022 15:25:39 -------------------------------------------------------------------------------- Vitals Details Patient Name: Date of Service: [name]. 12/22/2022 3:00 PM Medical Record Number: [id number] Patient Account Number: [id number] Date of Birth/Sex: Treating RN: 06-09-1965 (57 y.o. [name] Primary Care Nancy Manuele: [name] Other Clinician: Referring Paschal Blanton: Treating Parthena Fergeson/Extender: [name] in Treatment: 4 Vital Signs Tuminello, [name] ([id number]) [id number] Page 9 of 9 Time Taken: 15:22 Temperature (F): 98.9 Height (in): 64 Pulse (bpm): 96 Weight (lbs): 261 Respiratory Rate (breaths/min): 20 Body Mass Index (BMI): 44.8 Blood Pressure (mmHg): 116/75 Reference Range: 80 - 120 mg / dl Electronic Signature(s) Signed: 12/22/2022 5:33:37 PM By: Shawn Stall RN, BSN Entered By: Shawn Stall on 12/22/2022 15:22:57

## 2022-12-29 ENCOUNTER — Encounter (HOSPITAL_BASED_OUTPATIENT_CLINIC_OR_DEPARTMENT_OTHER): Payer: Commercial Managed Care - HMO | Attending: Internal Medicine | Admitting: Internal Medicine

## 2022-12-29 DIAGNOSIS — S68614A Complete traumatic transphalangeal amputation of right ring finger, initial encounter: Secondary | ICD-10-CM | POA: Diagnosis not present

## 2022-12-29 DIAGNOSIS — L97518 Non-pressure chronic ulcer of other part of right foot with other specified severity: Secondary | ICD-10-CM | POA: Insufficient documentation

## 2022-12-29 DIAGNOSIS — E1140 Type 2 diabetes mellitus with diabetic neuropathy, unspecified: Secondary | ICD-10-CM | POA: Diagnosis not present

## 2022-12-29 DIAGNOSIS — Z89512 Acquired absence of left leg below knee: Secondary | ICD-10-CM | POA: Insufficient documentation

## 2022-12-29 DIAGNOSIS — W2203XA Walked into furniture, initial encounter: Secondary | ICD-10-CM | POA: Insufficient documentation

## 2022-12-29 DIAGNOSIS — E11621 Type 2 diabetes mellitus with foot ulcer: Secondary | ICD-10-CM | POA: Diagnosis not present

## 2022-12-29 DIAGNOSIS — M1990 Unspecified osteoarthritis, unspecified site: Secondary | ICD-10-CM | POA: Insufficient documentation

## 2023-01-05 ENCOUNTER — Encounter (HOSPITAL_BASED_OUTPATIENT_CLINIC_OR_DEPARTMENT_OTHER): Payer: Commercial Managed Care - HMO | Admitting: Internal Medicine

## 2023-01-05 DIAGNOSIS — L97518 Non-pressure chronic ulcer of other part of right foot with other specified severity: Secondary | ICD-10-CM

## 2023-01-05 DIAGNOSIS — E11621 Type 2 diabetes mellitus with foot ulcer: Secondary | ICD-10-CM

## 2023-01-05 NOTE — Progress Notes (Signed)
ADALEN, DILL Park ([id number]) [id number] Page 1 of 9 Visit Report for 01/05/2023 Chief Complaint Document Details Patient Name: Date of Service: [name]. 01/05/2023 8:45 A M Medical Record Number: [id number] Patient Account Number: [id number] Date of Birth/Sex: Treating RN: Aug 21, 1965 (57 y.o. F) Primary Care Provider: Antony Haste Other Clinician: Referring Provider: Treating Provider/Extender: Avelina Laine in Treatment: 6 Information Obtained from: Patient Chief Complaint 11/23/2022; right lateral ankle wound Electronic Signature(s) Signed: 01/05/2023 1:53:07 PM By: Geralyn Corwin DO Entered By: Geralyn Corwin on 01/05/2023 09:16:24 -------------------------------------------------------------------------------- Debridement Details Patient Name: Date of Service: [name]. 01/05/2023 8:45 A M Medical Record Number: [id number] Patient Account Number: [id number] Date of Birth/Sex: Treating RN: [birth year] (57 y.o. Ardis Rowan, Lauren Primary Care Provider: Antony Haste Other Clinician: Referring Provider: Treating Provider/Extender: Avelina Laine in Treatment: 6 Debridement Performed for Assessment: Wound #4 Right,Lateral Ankle Performed By: Physician Geralyn Corwin, DO The following information was scribed by: Fonnie Mu The information was scribed for: Geralyn Corwin Debridement Type: Debridement Severity of Tissue Pre Debridement: Fat layer exposed Level of Consciousness (Pre-procedure): Awake and Alert Pre-procedure Verification/Time Out Yes - 09:12 Taken: Start Time: 09:12 Pain Control: Lidocaine Percent of Wound Bed Debrided: 100% T Area Debrided (cm): otal 2.26 Tissue and other material debrided: Viable, Non-Viable, Slough, Subcutaneous, Slough Level: Skin/Subcutaneous Tissue Debridement Description: Excisional Instrument: Curette Bleeding:  Minimum Hemostasis Achieved: Pressure End Time: 09:12 Procedural Pain: 0 Post Procedural Pain: 0 Response to Treatment: Procedure was tolerated well Level of Consciousness (Post- Awake and Alert procedure): Post Debridement Measurements of Total Wound Length: (cm) 1.8 Width: (cm) 1.6 Depth: (cm) 0.1 [name], [name] ([id number]) [id number] Page 2 of 9 Volume: (cm) 0.226 Character of Wound/Ulcer Post Debridement: Improved Severity of Tissue Post Debridement: Fat layer exposed Post Procedure Diagnosis Same as Pre-procedure Electronic Signature(s) Signed: 01/05/2023 1:53:07 PM By: Geralyn Corwin DO Signed: 01/05/2023 3:48:55 PM By: Fonnie Mu RN Entered By: Fonnie Mu on 01/05/2023 09:13:47 -------------------------------------------------------------------------------- HPI Details Patient Name: Date of Service: [name]. 01/05/2023 8:45 A M Medical Record Number: [id number] Patient Account Number: [id number] Date of Birth/Sex: Treating RN: August 11, 1965 (57 y.o. F) Primary Care Provider: Antony Haste Other Clinician: Referring Provider: Treating Provider/Extender: Avelina Laine in Treatment: 6 History of Present Illness HPI Description: ABI on Right: 1.01 Ms. 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 Most recently she had an amputation of the right ring finger. She presents today with a wound to the right lateral ankle that she has had for 3 months that was caused by her hitting the area with her wheelchair. She has been Vashe dressings to clean the wound bed followed by antibiotic ointment. ABIs in office are 1.01 on the left and she has palpable dorsalis pedis and posterior tibial artery pulses. Her foot is warm and well-perfused. 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 She has been cleaning the area with Vashe and using Neosporin to the wound bed. She currently denies signs of infection. Patient has a history of ankle surgery on 04-07-22 with right partial hardware removal and revision subtalar fusion. 8/6; patient presents for follow-up. She has been using Santyl to the wound bed and has been taking Augmentin and doxycycline without issues. She is scheduled for her  MRI towards the end of the month. She has no issues or complaints. 8/13; patient presents for follow-up. She is scheduled for MRI next week. She has been using Santyl and Hydrofera Blue to the wound bed. She is taking Augmentin and doxycycline without issues. She has been on her feet more recently and has noted slightly more swelling to the ankle and more erythema. She denies systemic signs of infection. 8/27; patient presents for follow-up. She is completed 4 weeks of oral antibiotics. She obtained her MRI of the right ankle however this has not been read yet. She denies signs of infection. Wound is smaller. She has been using Santyl and Hydrofera Blue. 9/3; patient presents for follow-up. She had an MRI completed on 8/27. Reading shows no definitive active osteomyelitis to the wound over the lateral ankle. It did notice partial tearing of the peroneus brevis and longus, partial tearing of the distal Achilles tendon and partial tearing of the tibialis posterior tendon. She has an appointment with her podiatrist based on these findings next week. She is been using Santyl and Hydrofera Blue to the wound bed. Along with her compression stockings. Wound is smaller. 9/10; patient presents for follow-up. She has been using Santyl and Hydrofera Blue to the wound bed along with her compression stockings daily. Wound is smaller. She has no issues or complaints. Insurance is asking for more recent hemoglobin A1c for potential approval of skin substitute. Patient is going to try and  obtain this from her PCP. Electronic Signature(s) Signed: 01/05/2023 1:53:07 PM By: Geralyn Corwin DO Entered By: Geralyn Corwin on 01/05/2023 09:18:19 -------------------------------------------------------------------------------- Physical Exam Details Patient Name: Date of Service: [name] [name] NDRA Park. 01/05/2023 8:45 A M Medical Record Number: [id number] Patient Account Number: [id number] Date of Birth/Sex: Treating RN: Apr 17, 1966 (57 y.o. F) Primary Care Provider: Antony Haste Other Clinician: Referring Provider: Treating Provider/Extender: Jaylei, [name] ([id number]) [id number] Page 3 of 9 Weeks in Treatment: 6 Constitutional respirations regular, non-labored and within target range for patient.. Cardiovascular 2+ dorsalis pedis/posterior tibialis pulses. Psychiatric pleasant and cooperative. Notes Right foot: T the lateral ankle there is an open wound with granulation tissue and slough to the wound bed. No purulent drainage. good edema control. No signs o of surrounding infection including increased warmth, erythema or purulent drainage. Electronic Signature(s) Signed: 01/05/2023 1:53:07 PM By: Geralyn Corwin DO Entered By: Geralyn Corwin on 01/05/2023 09:18:42 -------------------------------------------------------------------------------- Physician Orders Details Patient Name: Date of Service: [name]. 01/05/2023 8:45 A M Medical Record Number: [id number] Patient Account Number: [id number] Date of Birth/Sex: Treating RN: 1965-11-02 (57 y.o. Toniann Fail Primary Care Provider: Antony Haste Other Clinician: Referring Provider: Treating Provider/Extender: Avelina Laine in Treatment: 6 Verbal / Phone Orders: No Diagnosis Coding Follow-up Appointments ppointment in 1 week. - w/ Dr. Leanord Hawking Tuesday 01/12/23 @ 3:15 Rm # 9 (already has appt.) Return  A Anesthetic (In clinic) Topical Lidocaine 4% applied to wound bed Bathing/ Shower/ Hygiene May shower with protection but do not get wound dressing(s) wet. Protect dressing(s) with water repellant cover (for example, large plastic bag) or a cast cover and may then take shower. Edema Control - Lymphedema / SCD / Other Elevate legs to the level of the heart or above for 30 minutes daily and/or when sitting for 3-4 times a day throughout the day. Avoid standing for long periods of time. Patient to wear own compression stockings every day. Wound Treatment Wound #4 - Ankle  Wound Laterality: Right, Lateral Cleanser: Vashe 5.8 (oz) Discharge Instructions: Cleanse the wound with Vashe prior to applying a clean dressing using gauze sponges, not tissue or cotton balls. Topical: Santyl Collagenase Ointment, 30 (gm), tube Prim Dressing: Hydrofera Blue Ready Transfer Foam, 4x5 (in/in) ary Discharge Instructions: Apply to wound bed as instructed Secondary Dressing: Zetuvit Plus 4x8 in Discharge Instructions: Apply over primary dressing as directed. Electronic Signature(s) Signed: 01/05/2023 1:53:07 PM By: Geralyn Corwin DO Entered By: Geralyn Corwin on 01/05/2023 09:18:56 [name], [name] ([id number]) [id number] Page 4 of 9 -------------------------------------------------------------------------------- Problem List Details Patient Name: Date of Service: [name]. 01/05/2023 8:45 A M Medical Record Number: [id number] Patient Account Number: [id number] Date of Birth/Sex: Treating RN: 1965-10-22 (57 y.o. 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 01/05/2023 8:45 A M Medical Record Number: [id number] Patient Account Number: [id number] Date of Birth/Sex: Treating RN: 1966-01-26 (57 y.o. F) Primary Care Provider: Antony Haste Other Clinician: Referring Provider: Treating Provider/Extender: Avelina Laine in Treatment: 6 Subjective Chief Complaint Information obtained from Patient 11/23/2022; right lateral ankle wound History of Present Illness (HPI) ABI on Right: 1.01 Ms. 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 Most recently she had an amputation of the right ring finger. She presents today with a wound to the right lateral ankle that she has Kulik, [name] ([id number]) [id number].pdf Page 5 of 9 had for 3 months that was caused by her hitting the area with her wheelchair. She has been Vashe dressings to clean the wound bed followed by antibiotic ointment. ABIs in office are 1.01 on the left and she has palpable dorsalis pedis and posterior tibial artery pulses. Her foot is warm and well-perfused. 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 She has been cleaning the area with Vashe and using Neosporin to the wound bed. She currently denies signs of infection.  Patient has a history of ankle surgery on 04-07-22 with right partial hardware removal and revision subtalar fusion. 8/6; patient presents for follow-up. She has been using Santyl to the wound bed and has been taking Augmentin and doxycycline without issues. She is scheduled for her MRI towards the end of the month. She has no issues or complaints. 8/13; patient presents for follow-up. She is scheduled for MRI next week. She has been using Santyl and Hydrofera Blue to the wound bed. She is taking Augmentin and doxycycline without issues. She has been on her feet more recently and has noted slightly more swelling to the ankle and more erythema. She denies systemic signs of infection. 8/27; patient presents for follow-up. She is completed 4 weeks of oral antibiotics. She obtained her MRI of the  right ankle however this has not been read yet. She denies signs of infection. Wound is smaller. She has been using Santyl and Hydrofera Blue. 9/3; patient presents for follow-up. She had an MRI completed on 8/27. Reading shows no definitive active osteomyelitis to the wound over the lateral ankle. It did notice partial tearing of the peroneus brevis and longus, partial tearing of the distal Achilles tendon and partial tearing of the tibialis posterior tendon. She has an appointment with her podiatrist based on these findings next week. She is been using Santyl and Hydrofera Blue to the wound bed. Along with her compression stockings. Wound is smaller. 9/10; patient presents for follow-up. She has been using Santyl and Hydrofera Blue to the wound bed along with her compression stockings daily. Wound is smaller. She has no issues or complaints. Insurance is asking for more recent hemoglobin A1c for potential approval of skin substitute. Patient is going to try and obtain this from her PCP. Patient History Family History Cancer - Mother, Diabetes - Mother, Stroke - Father, No family history of Heart Disease,  Hereditary Spherocytosis, Hypertension, Kidney Disease, Lung Disease, Seizures, Thyroid Problems, Tuberculosis. Social History Never smoker, Marital Status - Married, Alcohol Use - Rarely, Drug Use - No History, Caffeine Use - Rarely - coke. Medical History Eyes Denies history of Cataracts, Glaucoma, Optic Neuritis Ear/Nose/Mouth/Throat Denies history of Chronic sinus problems/congestion, Middle ear problems Hematologic/Lymphatic Patient has history of Anemia Denies history of Hemophilia, Human Immunodeficiency Virus, Lymphedema, Sickle Cell Disease Respiratory Patient has history of Sleep Apnea Denies history of Aspiration, Asthma, Chronic Obstructive Pulmonary Disease (COPD), Pneumothorax, Tuberculosis Cardiovascular Denies history of Angina, Arrhythmia, Congestive Heart Failure, Coronary Artery Disease, Deep Vein Thrombosis, Hypertension, Hypotension, Myocardial Infarction, Peripheral Arterial Disease, Peripheral Venous Disease, Phlebitis, Vasculitis Gastrointestinal Denies history of Cirrhosis , Colitis, Crohns, Hepatitis A, Hepatitis B, Hepatitis C Endocrine Patient has history of Type II Diabetes - 3/24 HgbA1c 12.2 Denies history of Type I Diabetes Genitourinary Denies history of End Stage Renal Disease Immunological Denies history of Lupus Erythematosus, Raynauds, Scleroderma Integumentary (Skin) Denies history of History of Burn Musculoskeletal Patient has history of Gout, Osteoarthritis, Osteomyelitis - fingers Denies history of Rheumatoid Arthritis Neurologic Patient has history of Neuropathy Denies history of Dementia, Quadriplegia, Paraplegia, Seizure Disorder Oncologic Denies history of Received Chemotherapy, Received Radiation Psychiatric Denies history of Anorexia/bulimia, Confinement Anxiety Hospitalization/Surgery History - left ankle reconstruction. - left ankle reconstruction. - left foot surgery (charcots). - right knee septic. - right knee replacement. -  left knee replacement. - 11/05/2022 right ring finger amputation. - 07/02/2022 left middle finger amputation. - 08/03/2020 IandD abscess. - 09/2017 L BKA. - 2/19 right finger amputation. - right ankle reconstruction x3 surgeries 12/23. Medical A Surgical History Notes nd Respiratory acute bacterial bronchitis 05/16/2022 RSV Gastrointestinal GERD Musculoskeletal Charcot Joint right foot Objective [name], [name] ([id number]) [phone].pdf Page 6 of 9 Constitutional respirations regular, non-labored and within target range for patient.. Vitals Time Taken: 8:31 AM, Height: 64 in, Weight: 261 lbs, BMI: 44.8, Temperature: 98.4 F, Pulse: 86 bpm, Respiratory Rate: 20 breaths/min, Blood Pressure: 135/76 mmHg, Capillary Blood Glucose: 135 mg/dl. Cardiovascular 2+ dorsalis pedis/posterior tibialis pulses. Psychiatric pleasant and cooperative. General Notes: Right foot: T the lateral ankle there is an open wound with granulation tissue and slough to the wound bed. No purulent drainage. good edema o control. No signs of surrounding infection including increased warmth, erythema or purulent drainage. Integumentary (Hair, Skin) Wound #4 status is Open. Original cause of wound was Trauma. The  date acquired was: 07/27/2022. The wound has been in treatment 6 weeks. The wound is located on the Right,Lateral Ankle. The wound measures 1.8cm length x 1.6cm width x 0.1cm depth; 2.262cm^2 area and 0.226cm^3 volume. There is Fat Layer (Subcutaneous Tissue) exposed. There is no tunneling or undermining noted. There is a medium amount of serosanguineous drainage noted. The wound margin is distinct with the outline attached to the wound base. There is large (67-100%) pink granulation within the wound bed. There is a small (1-33%) amount of necrotic tissue within the wound bed including Adherent Slough. The periwound skin appearance did not exhibit: Callus, Crepitus, Excoriation,  Induration, Rash, Scarring, Dry/Scaly, Maceration, Atrophie Blanche, Cyanosis, Ecchymosis, Hemosiderin Staining, Mottled, Pallor, Rubor, Erythema. Periwound temperature was noted as No Abnormality. 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 I debrided nonviable tissue. I recommended continue the course with Santyl and Hydrofera Blue. Continue compression stockings daily. Follow-up with PCP for diabetes management and up-to-date hemoglobin A1c. Procedures Wound #4 Pre-procedure diagnosis of Wound #4 is a Diabetic Wound/Ulcer of the Lower Extremity located on the Right,Lateral Ankle .Severity of Tissue Pre Debridement is: Fat layer exposed. There was a Excisional Skin/Subcutaneous Tissue Debridement with a total area of 2.26 sq cm performed by Geralyn Corwin, DO. With the following instrument(s): Curette to remove Viable and Non-Viable tissue/material. Material removed includes Subcutaneous Tissue and Slough and after achieving pain control using Lidocaine. No specimens were taken. A time out was conducted at 09:12, prior to the start of the procedure. A Minimum amount of bleeding was controlled with Pressure. The procedure was tolerated well with a pain level of 0 throughout and a pain level of 0 following the procedure. Post Debridement Measurements: 1.8cm length x 1.6cm width x 0.1cm depth; 0.226cm^3 volume. Character of Wound/Ulcer Post Debridement is improved. Severity of Tissue Post Debridement is: Fat layer exposed. Post procedure Diagnosis Wound #4: Same as Pre-Procedure Plan Follow-up Appointments: Return Appointment in 1 week. - w/ Dr. Leanord Hawking Tuesday 01/12/23 @  3:15 Rm # 9 (already has appt.) Anesthetic: (In clinic) Topical Lidocaine 4% applied to wound bed Bathing/ Shower/ Hygiene: May shower with protection but do not get wound dressing(s) wet. Protect dressing(s) with water repellant cover (for example, large plastic bag) or a cast cover and may then take shower. Edema Control - Lymphedema / SCD / Other: Elevate legs to the level of the heart or above for 30 minutes daily and/or when sitting for 3-4 times a day throughout the day. Avoid standing for long periods of time. Patient to wear own compression stockings every day. WOUND #4: - Ankle Wound Laterality: Right, Lateral Cleanser: Vashe 5.8 (oz) Discharge Instructions: Cleanse the wound with Vashe prior to applying a clean dressing using gauze sponges, not tissue or cotton balls. Topical: Santyl Collagenase Ointment, 30 (gm), tube Prim Dressing: Hydrofera Blue Ready Transfer Foam, 4x5 (in/in) ary Discharge Instructions: Apply to wound bed as instructed Secondary Dressing: Zetuvit Plus 4x8 in Discharge Instructions: Apply over primary dressing as directed. [name], NARCISSE Park ([id number]) [id number] Page 7 of 9 1. In office sharp debridement 2. Santyl and Hydrofera Blue 3. Compression stockings daily 4. Follow-up in 1 week Electronic Signature(s) Signed: 01/05/2023 5:36:30 PM By: Shawn Stall RN, BSN Signed:  01/07/2023 3:50:21 PM By: Geralyn Corwin DO Previous Signature: 01/05/2023 1:53:07 PM Version By: Geralyn Corwin DO Entered By: Shawn Stall on 01/05/2023 17:35:18 -------------------------------------------------------------------------------- HxROS Details Patient Name: Date of Service: [name]. 01/05/2023 8:45 A M Medical Record Number: [id number] Patient Account Number: [id number] Date of Birth/Sex: Treating RN: Jun 05, 1965 (57 y.o. F) Primary Care Provider: Antony Haste Other Clinician: Referring Provider: Treating  Provider/Extender: Avelina Laine in Treatment: 6 Eyes Medical History: Negative for: Cataracts; Glaucoma; Optic Neuritis Ear/Nose/Mouth/Throat Medical History: Negative for: Chronic sinus problems/congestion; Middle ear problems Hematologic/Lymphatic Medical History: Positive for: Anemia Negative for: Hemophilia; Human Immunodeficiency Virus; Lymphedema; Sickle Cell Disease Respiratory Medical History: Positive for: Sleep Apnea Negative for: Aspiration; Asthma; Chronic Obstructive Pulmonary Disease (COPD); Pneumothorax; Tuberculosis Past Medical History Notes: acute bacterial bronchitis 05/16/2022 RSV Cardiovascular Medical History: Negative for: Angina; Arrhythmia; Congestive Heart Failure; Coronary Artery Disease; Deep Vein Thrombosis; Hypertension; Hypotension; Myocardial Infarction; Peripheral Arterial Disease; Peripheral Venous Disease; Phlebitis; Vasculitis Gastrointestinal Medical History: Negative for: Cirrhosis ; Colitis; Crohns; Hepatitis A; Hepatitis B; Hepatitis C Past Medical History Notes: GERD Endocrine Medical History: Positive for: Type II Diabetes - 3/24 HgbA1c 12.2 Negative for: Type I Diabetes Time with diabetes: 1 year ago Treated with: Insulin, Diet Blood sugar tested every day: Yes Tested : x3 a day [name], [name] ([id number]) [phone].pdf Page 8 of 9 Genitourinary Medical History: Negative for: End Stage Renal Disease Immunological Medical History: Negative for: Lupus Erythematosus; Raynauds; Scleroderma Integumentary (Skin) Medical History: Negative for: History of Burn Musculoskeletal Medical History: Positive for: Gout; Osteoarthritis; Osteomyelitis - fingers Negative for: Rheumatoid Arthritis Past Medical History Notes: Charcot Joint right foot Neurologic Medical History: Positive for: Neuropathy Negative for: Dementia; Quadriplegia; Paraplegia; Seizure  Disorder Oncologic Medical History: Negative for: Received Chemotherapy; Received Radiation Psychiatric Medical History: Negative for: Anorexia/bulimia; Confinement Anxiety Immunizations Pneumococcal Vaccine: Received Pneumococcal Vaccination: No Immunization Notes: last tetanus shot 6 years ago Implantable Devices No devices added Hospitalization / Surgery History Type of Hospitalization/Surgery left ankle reconstruction left ankle reconstruction left foot surgery (charcots) right knee septic right knee replacement left knee replacement 11/05/2022 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/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 IandD abscess 09/2017 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 right ankle reconstruction x3 surgeries 12/23 Family and Social History Cancer: Yes - Mother; Diabetes: Yes - Mother; Heart Disease: No; Hereditary Spherocytosis: No; Hypertension: No; Kidney Disease: No; Lung Disease: No; Seizures: No; Stroke: Yes - Father; Thyroid Problems: No; Tuberculosis: No; Never smoker; Marital Status - Married; Alcohol Use: Rarely; Drug Use: No History; Caffeine Use: Rarely - coke; Financial Concerns: No; Food, Clothing or Shelter Needs: No; Support System Lacking: No; Transportation Concerns: No Electronic Signature(s) Signed: 01/05/2023 1:53:07 PM By: Geralyn Corwin DO Entered By: Geralyn Corwin on 01/05/2023 09:18:26 [name], [name] ([id number]) [id number] Page 9 of 9 -------------------------------------------------------------------------------- SuperBill Details Patient Name: Date of Service: [name]. 01/05/2023 Medical Record Number: [id number] Patient Account Number: [id number] Date of Birth/Sex: Treating RN: [birth year] (57 y.o. 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, initial encounter L97.518 Non-pressure chronic ulcer of other part of right foot with other specified severity Facility Procedures : CPT4 Code: 20254270 Description: 11042 - DEB SUBQ TISSUE 20  SQ CM/< ICD-10 Diagnosis Description L97.518 Non-pressure chronic ulcer of other part of right foot with other specified sev E11.621 Type 2 diabetes mellitus with foot ulcer Modifier: erity Quantity: 1 Physician Procedures : CPT4 Code Description Modifier [id number] 11042 - WC PHYS SUBQ TISS 20 SQ CM ICD-10 Diagnosis Description L97.518 Non-pressure chronic ulcer of other part of right foot with other specified severity E11.621 Type 2 diabetes mellitus with foot ulcer Quantity: 1 Electronic Signature(s) Signed: 01/05/2023 1:53:07 PM By: Geralyn Corwin DO Entered By: Geralyn Corwin on 01/05/2023 09:19:59

## 2023-01-06 NOTE — Progress Notes (Signed)
MADRA, PARRON Park ([id number]) [id number] Page 1 of 7 Visit Report for 12/29/2022 Arrival Information Details Patient Name: Date of Service: [name]. 12/29/2022 2:30 PM Medical Record Number: [id number] Patient Account Number: [id number] Date of Birth/Sex: Treating RN: [birth year] (57 y.o. [name] Primary Care Shia Eber: Antony Haste Other Clinician: Referring Vaughn Beaumier: Treating Elwood Bazinet/Extender: Avelina Laine in Treatment: 5 Visit Information History Since Last Visit All ordered tests and consults were completed: Yes Patient Arrived: Ambulatory Added or deleted any medications: No Arrival Time: 15:33 Any new allergies or adverse reactions: No Accompanied By: self Had a fall or experienced change in No Transfer Assistance: None activities of daily living that may affect Patient Requires Transmission-Based Precautions: No risk of falls: Patient Has Alerts: No Signs or symptoms of abuse/neglect since last visito No Hospitalized since last visit: No Implantable device outside of the clinic excluding No cellular tissue based products placed in the center since last visit: Has Dressing in Place as Prescribed: Yes Pain Present Now: No Electronic Signature(s) Signed: 01/06/2023 7:59:39 AM By: Brenton Grills Entered By: Brenton Grills on 12/29/2022 15:34:47 -------------------------------------------------------------------------------- Encounter Discharge Information Details Patient Name: Date of Service: [name]. 12/29/2022 2:30 PM Medical Record Number: [id number] Patient Account Number: [id number] Date of Birth/Sex: Treating RN: Apr 21, 1966 (57 y.o. [name] Primary Care Rahshawn Remo: Antony Haste Other Clinician: Referring Kiira Brach: Treating Raahil Ong/Extender: Avelina Laine in Treatment: 5 Encounter Discharge Information Items Post Procedure Vitals Discharge  Condition: Stable Temperature (F): 98.3 Ambulatory Status: Ambulatory Pulse (bpm): 83 Discharge Destination: Home Respiratory Rate (breaths/min): 18 Transportation: Private Auto Blood Pressure (mmHg): 123/67 Accompanied By: Self Schedule Follow-up Appointment: Yes Clinical Summary of Care: Patient Declined Electronic Signature(s) Signed: 01/06/2023 7:59:39 AM By: Brenton Grills Entered By: Brenton Grills on 12/29/2022 16:29:39 Park, [name] Braver ([id number]) [id number] Page 2 of 7 -------------------------------------------------------------------------------- Lower Extremity Assessment Details Patient Name: Date of Service: [name]. 12/29/2022 2:30 PM Medical Record Number: [id number] Patient Account Number: [id number] Date of Birth/Sex: Treating RN: 06-15-1965 (57 y.o. [name] Primary Care Samamtha Tiegs: Antony Haste Other Clinician: Referring Iyanah Demont: Treating Khiana Camino/Extender: Avelina Laine in Treatment: 5 Edema Assessment Assessed: [name]: No] [name]: No] Edema: [Left: Ye] [Right: s] Calf Left: Right: Point of Measurement: 34.5 cm From Medial Instep 41 cm Ankle Left: Right: Point of Measurement: 12.5 cm From Medial Instep 26 cm Vascular Assessment Pulses: Dorsalis Pedis Palpable: [Right:Yes] Extremity colors, hair growth, and conditions: Extremity Color: [Right:Normal] Hair Growth on Extremity: [Right:No] Temperature of Extremity: [Right:Warm] Capillary Refill: [Right:< 3 seconds] Dependent Rubor: [Right:No No] Toe Nail Assessment Left: Right: Thick: No Discolored: No Deformed: No Improper Length and Hygiene: No Electronic Signature(s) Signed: 01/06/2023 7:59:39 AM By: Brenton Grills Entered By: Brenton Grills on 12/29/2022 15:37:08 -------------------------------------------------------------------------------- Multi Wound Chart Details Patient Name: Date of Service: [name] Draft NDRA  Park. 12/29/2022 2:30 PM Medical Record Number: [id number] Patient Account Number: [id number] Date of Birth/Sex: Treating RN: 07/23/1965 (57 y.o. F) Primary Care Leianne Callins: Antony Haste Other Clinician: Referring Jeanice Dempsey: Treating Jerrico Covello/Extender: Avelina Laine in Treatment: 5 Vital Signs Height(in): 64 Pulse(bpm): 91 Weight(lbs): 261 Blood Pressure(mmHg): 115/70 Body Mass Index(BMI): 44.8 Park, [name] ([id number]) [phone].pdf Page 3 of 7 Temperature(F): 99 Respiratory Rate(breaths/min): 18 [4:Photos: No Photos Right, Lateral Ankle Wound Location: Trauma Wounding Event: Diabetic Wound/Ulcer of the Lower Primary Etiology: Extremity Anemia, Sleep Apnea, Type II Comorbid History: Diabetes, Gout, Osteoarthritis, Osteomyelitis, Neuropathy  07/27/2022 Date  Acquired: 5 Weeks of Treatment: Open Wound Status: No Wound Recurrence: Yes Pending A mputation on Presentation: 2x1.7x0.1 Measurements L x W x D (cm) 2.67 A (cm) : rea 0.267 Volume (cm) : 7.60% % Reduction in A rea: 53.80% % Reduction in Volume: Grade 1  Classification: Medium Exudate A mount: Serosanguineous Exudate Type: red, brown Exudate Color: Distinct, outline attached Wound Margin: Medium (34-66%) Granulation A mount: Pink Granulation Quality: Medium (34-66%) Necrotic A mount: Fat Layer  (Subcutaneous Tissue): Yes N/A Exposed Structures: Fascia: No Tendon: No Muscle: No Joint: No Bone: No Small (1-33%) Epithelialization: Excoriation: No Periwound Skin Texture: Induration: No Callus: No Crepitus: No Rash: No Scarring: No Maceration: No  Periwound Skin Moisture: Dry/Scaly: No Atrophie Blanche: No Periwound Skin Color: Cyanosis: No Ecchymosis: No Erythema: No Hemosiderin Staining: No Mottled: No Pallor: No Rubor: No No Abnormality Temperature:] [N/A:N/A N/A N/A N/A N/A N/A N/A N/A N/A N/A  N/A N/A N/A N/A N/A N/A N/A N/A N/A N/A N/A N/A N/A N/A N/A N/A N/A N/A] Treatment Notes Electronic  Signature(s) Signed: 12/29/2022 4:31:18 PM By: Geralyn Corwin DO Entered By: Geralyn Corwin on 12/29/2022 15:50:06 -------------------------------------------------------------------------------- Multi-Disciplinary Care Plan Details Patient Name: Date of Service: [name]. 12/29/2022 2:30 PM Medical Record Number: [id number] Patient Account Number: [id number] Date of Birth/Sex: Treating RN: March 29, 1966 (57 y.o. [name] Primary Care Paislie Tessler: Antony Haste Other Clinician: Referring Shirlean Berman: Treating Najmah Carradine/Extender: Avelina Laine in Treatment: 5 Mcgahan, [name] Braver ([id number]) [id number] Page 4 of 7 Active Inactive Necrotic Tissue Nursing Diagnoses: Knowledge deficit related to management of necrotic/devitalized tissue Goals: Necrotic/devitalized tissue will be minimized in the wound bed Date Initiated: 11/23/2022 Target Resolution Date: 01/22/2023 Goal Status: Active Patient/caregiver will verbalize understanding of reason and process for debridement of necrotic tissue Date Initiated: 11/23/2022 Target Resolution Date: 01/22/2023 Goal Status: Active Interventions: Assess patient pain level pre-, during and post procedure and prior to discharge Provide education on necrotic tissue and debridement process Treatment Activities: Biologic debridement : 11/23/2022 Excisional debridement : 11/23/2022 Notes: Nutrition Nursing Diagnoses: Potential for alteratiion in Nutrition/Potential for imbalanced nutrition Goals: Patient/caregiver agrees to and verbalizes understanding of need to obtain nutritional consultation Date Initiated: 11/23/2022 Date Inactivated: 12/22/2022 Target Resolution Date: 12/25/2022 Goal Status: Met Patient/caregiver will maintain therapeutic glucose control Date Initiated: 11/23/2022 Target Resolution Date: 01/22/2023 Goal Status: Active Interventions: Assess HgA1c results as ordered upon  admission and as needed Provide education on elevated blood sugars and impact on wound healing Provide education on nutrition Treatment Activities: Obtain HgA1c : 11/23/2022 Patient referred to Primary Care Physician for further nutritional evaluation : 11/23/2022 Notes: Pain, Acute or Chronic Nursing Diagnoses: Potential alteration in comfort, pain Goals: Patient will verbalize adequate pain control and receive pain control interventions during procedures as needed Date Initiated: 11/23/2022 Target Resolution Date: 01/22/2023 Goal Status: Active Patient/caregiver will verbalize comfort level met Date Initiated: 11/23/2022 Target Resolution Date: 01/22/2023 Goal Status: Active Interventions: Encourage patient to take pain medications as prescribed Provide education on pain management Reposition patient for comfort Treatment Activities: Administer pain control measures as ordered : 11/23/2022 Notes: Wound/Skin Impairment Nursing Diagnoses: Knowledge deficit related to ulceration/compromised skin integrity Park, [name] ([id number]) [id number] Page 5 of 7 Goals: Ulcer/skin breakdown will heal within 14 weeks Date Initiated: 11/23/2022 Target Resolution Date: 03/25/2023 Goal Status: Active Interventions: Assess patient/caregiver ability to perform ulcer/skin care regimen upon admission and as needed Assess ulceration(s) every visit Provide education on ulcer and skin care Treatment  Activities: Skin care regimen initiated : 11/23/2022 Topical wound management initiated : 11/23/2022 Notes: Electronic Signature(s) Signed: 01/06/2023 7:59:39 AM By: Brenton Grills Entered By: Brenton Grills on 12/29/2022 15:42:42 -------------------------------------------------------------------------------- Pain Assessment Details Patient Name: Date of Service: [name]. 12/29/2022 2:30 PM Medical Record Number: [id number] Patient Account Number:  [id number] Date of Birth/Sex: Treating RN: [birth year] (57 y.o. [name] Primary Care Clevland Cork: Antony Haste Other Clinician: Referring Dezaria Methot: Treating Channing Yeager/Extender: Avelina Laine in Treatment: 5 Active Problems Location of Pain Severity and Description of Pain Patient Has Paino No Site Locations Pain Management and Medication Current Pain Management: Electronic Signature(s) Signed: 01/06/2023 7:59:39 AM By: Brenton Grills Entered By: Brenton Grills on 12/29/2022 15:36:21 Melito, [name] Braver ([id number]) [id number] Page 6 of 7 -------------------------------------------------------------------------------- Patient/Caregiver Education Details Patient Name: Date of Service: [name] [name] NDRA Park. 9/3/2024andnbsp2:30 PM Medical Record Number: [id number] Patient Account Number: [id number] Date of Birth/Gender: Treating RN: 08/02/1965 (57 y.o. [name] Primary Care Physician: Antony Haste Other Clinician: Referring Physician: Treating Physician/Extender: Avelina Laine in Treatment: 5 Education Assessment Education Provided To: Patient Education Topics Provided Wound/Skin Impairment: Methods: Explain/Verbal Responses: State content correctly Electronic Signature(s) Signed: 01/06/2023 7:59:39 AM By: Brenton Grills Entered By: Brenton Grills on 12/29/2022 16:30:13 -------------------------------------------------------------------------------- Wound Assessment Details Patient Name: Date of Service: [name] [name] NDRA Park. 12/29/2022 2:30 PM Medical Record Number: [id number] Patient Account Number: [id number] Date of Birth/Sex: Treating RN: [birth year] (57 y.o. [name] Primary Care Ayame Rena: Antony Haste Other Clinician: Referring Billie Trager: Treating Charleene Callegari/Extender: Avelina Laine in Treatment: 5 Wound Status Wound Number: 4 Primary Diabetic  Wound/Ulcer of the Lower Extremity Etiology: Wound Location: Right, Lateral Ankle Wound Open Wounding Event: Trauma Status: Date Acquired: 07/27/2022 Comorbid Anemia, Sleep Apnea, Type II Diabetes, Gout, Osteoarthritis, Weeks Of Treatment: 5 History: Osteomyelitis, Neuropathy Clustered Wound: No Pending Amputation On Presentation Wound Measurements Length: (cm) 2 Width: (cm) 1.7 Depth: (cm) 0.1 Area: (cm) 2.67 Volume: (cm) 0.267 % Reduction in Area: 7.6% % Reduction in Volume: 53.8% Epithelialization: Small (1-33%) Tunneling: No Undermining: No Wound Description Classification: Grade 1 Wound Margin: Distinct, outline attached Exudate Amount: Medium Exudate Type: Serosanguineous Exudate Color: red, brown Foul Odor After Cleansing: No Slough/Fibrino Yes Wound Bed Granulation Amount: Medium (34-66%) Exposed Structure Park, [name] ([id number]) [id number] Page 7 of 7 Granulation Quality: Pink Fascia Exposed: No Necrotic Amount: Medium (34-66%) Fat Layer (Subcutaneous Tissue) Exposed: Yes Necrotic Quality: Adherent Slough Tendon Exposed: No Muscle Exposed: No Joint Exposed: No Bone Exposed: No Periwound Skin Texture Texture Color No Abnormalities Noted: No No Abnormalities Noted: No Callus: No Atrophie Blanche: No Crepitus: No Cyanosis: No Excoriation: No Ecchymosis: No Induration: No Erythema: No Rash: No Hemosiderin Staining: No Scarring: No Mottled: No Pallor: No Moisture Rubor: No No Abnormalities Noted: No Dry / Scaly: No Temperature / Pain Maceration: No Temperature: No Abnormality Electronic Signature(s) Signed: 01/06/2023 7:59:39 AM By: Brenton Grills Entered By: Brenton Grills on 12/29/2022 15:38:26 -------------------------------------------------------------------------------- Vitals Details Patient Name: Date of Service: [name]. 12/29/2022 2:30 PM Medical Record Number: [id number] Patient Account  Number: [id number] Date of Birth/Sex: Treating RN: 1965/11/26 (57 y.o. [name] Primary Care Rucker Pridgeon: Antony Haste Other Clinician: Referring Alayssa Flinchum: Treating Ethel Meisenheimer/Extender: Avelina Laine in Treatment: 5 Vital Signs Time Taken: 15:35 Temperature (F): 99 Height (in): 64 Pulse (bpm): 91 Weight (lbs): 261 Respiratory Rate (breaths/min): 18 Body Mass Index (BMI): 44.8 Blood Pressure (  mmHg): 115/70 Reference Range: 80 - 120 mg / dl Electronic Signature(s) Signed: 01/06/2023 7:59:39 AM By: Brenton Grills Entered By: Brenton Grills on 12/29/2022 15:35:45

## 2023-01-06 NOTE — Progress Notes (Signed)
FRANTASIA, COULIBALY Park ([id number]) [id number] Page 1 of 8 Visit Report for 01/05/2023 Arrival Information Details Patient Name: Date of Service: [name]. 01/05/2023 8:45 A M Medical Record Number: [id number] Patient Account Number: [id number] Date of Birth/Sex: Treating RN: March 22, 1966 (57 y.o. [name], [name] Primary Care Pearlean Sabina: Antony Haste Other Clinician: Referring Brenda Samano: Treating Vihaan Gloss/Extender: Avelina Laine in Treatment: 6 Visit Information History Since Last Visit Added or deleted any medications: No Patient Arrived: Ambulatory Any new allergies or adverse reactions: No Arrival Time: 08:30 Had a fall or experienced change in No Accompanied By: self activities of daily living that may affect Transfer Assistance: None risk of falls: Patient Identification Verified: Yes Signs or symptoms of abuse/neglect since last visito No Secondary Verification Process Completed: Yes Hospitalized since last visit: No Patient Requires Transmission-Based Precautions: No Implantable device outside of the clinic excluding No Patient Has Alerts: No cellular tissue based products placed in the center since last visit: Has Dressing in Place as Prescribed: Yes Pain Present Now: Yes Notes Seen podiatyist said no need for surgical intervention since she is walking and ankle fused. Electronic Signature(s) Signed: 01/05/2023 5:22:41 PM By: Shawn Stall RN, BSN Entered By: Shawn Stall on 01/05/2023 05:31:04 -------------------------------------------------------------------------------- Encounter Discharge Information Details Patient Name: Date of Service: [name]. 01/05/2023 8:45 A M Medical Record Number: [id number] Patient Account Number: [id number] Date of Birth/Sex: Treating RN: December 03, 1965 (57 y.o. [name], [name] Primary Care Layton Naves: Antony Haste Other Clinician: Referring Janayah Zavada: Treating  Tonimarie Gritz/Extender: Avelina Laine in Treatment: 6 Encounter Discharge Information Items Post Procedure Vitals Discharge Condition: Stable Temperature (F): 98.7 Ambulatory Status: Ambulatory Pulse (bpm): 74 Discharge Destination: Home Respiratory Rate (breaths/min): 17 Transportation: Private Auto Blood Pressure (mmHg): 120/80 Accompanied By: self Schedule Follow-up Appointment: Yes Clinical Summary of Care: Patient Declined Electronic Signature(s) Signed: 01/05/2023 3:48:55 PM By: Fonnie Mu RN Entered By: Fonnie Mu on 01/05/2023 06:16:52 [name], [name] ([id number]) [id number] Page 2 of 8 -------------------------------------------------------------------------------- Lower Extremity Assessment Details Patient Name: Date of Service: [name]. 01/05/2023 8:45 A M Medical Record Number: [id number] Patient Account Number: [id number] Date of Birth/Sex: Treating RN: [birth year] (57 y.o. [name] Primary Care Laquasia Pincus: Antony Haste Other Clinician: Referring Letishia Elliott: Treating Djon Tith/Extender: Avelina Laine in Treatment: 6 Edema Assessment Assessed: [name]: No] [name]: Yes] Edema: [Left: Ye] [Right: s] Calf Left: Right: Point of Measurement: 34.5 cm From Medial Instep 41 cm Ankle Left: Right: Point of Measurement: 12.5 cm From Medial Instep 26 cm Vascular Assessment Pulses: Dorsalis Pedis Palpable: [Right:Yes] Extremity colors, hair growth, and conditions: Extremity Color: [Right:Normal] Hair Growth on Extremity: [Right:No] Temperature of Extremity: [Right:Warm] Capillary Refill: [Right:< 3 seconds] Dependent Rubor: [Right:No] Blanched when Elevated: [Right:No No] Toe Nail Assessment Left: Right: Thick: Yes Discolored: Yes Deformed: Yes Improper Length and Hygiene: No Electronic Signature(s) Signed: 01/05/2023 5:22:41 PM By: Shawn Stall RN, BSN Entered  By: Shawn Stall on 01/05/2023 05:33:53 -------------------------------------------------------------------------------- Multi Wound Chart Details Patient Name: Date of Service: [name]. 01/05/2023 8:45 A M Medical Record Number: [id number] Patient Account Number: [id number] Date of Birth/Sex: Treating RN: [birth year] (57 y.o. 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 Etiology: Extremity Anemia, Sleep Apnea, Type II Comorbid History: Diabetes, Gout, Osteoarthritis, Osteomyelitis, Neuropathy 07/27/2022 Date  Acquired: 6 Weeks of Treatment: Open Wound Status: No Wound Recurrence: Yes Pending A mputation on Presentation: 1.8x1.6x0.1 Measurements L x W x D (cm) 2.262 A (cm) : rea 0.226 Volume (cm) : 21.70% % Reduction in A rea: 60.90% % Reduction in Volume:  Grade 1 Classification: Medium Exudate A mount: Serosanguineous Exudate Type: red, brown Exudate Color: Distinct, outline attached Wound Margin: Large (67-100%) Granulation A mount: Pink Granulation Quality: Small (1-33%) Necrotic A mount: Fat Layer  (Subcutaneous Tissue): Yes N/A Exposed Structures: Fascia: No Tendon: No Muscle: No Joint: No Bone: No Small (1-33%) Epithelialization: Debridement - Excisional Debridement: Pre-procedure Verification/Time Out 09:12 Taken: Lidocaine Pain Control:  Subcutaneous, Slough Tissue Debrided: Skin/Subcutaneous Tissue Level: 2.26 Debridement A (sq cm): rea Curette  Instrument: Minimum Bleeding: Pressure Hemostasis A chieved: 0 Procedural Pain: 0 Post Procedural Pain: Procedure was tolerated well Debridement  Treatment Response: 1.8x1.6x0.1 Post Debridement Measurements L x W x D (cm) 0.226 Post Debridement Volume: (cm) Excoriation: No Periwound Skin Texture: Induration: No Callus: No Crepitus: No Rash: No Scarring: No Maceration: No Periwound Skin Moisture:  Dry/Scaly: No Atrophie Blanche: No Periwound Skin Color: Cyanosis: No Ecchymosis: No Erythema: No Hemosiderin Staining: No Mottled: No Pallor: No Rubor: No No Abnormality Temperature: Debridement Procedures Performed:] [N/A:N/A N/A N/A N/A N/A N/A N/A  N/A N/A N/A N/A N/A N/A N/A N/A N/A N/A N/A N/A N/A N/A N/A N/A N/A N/A N/A N/A N/A N/A N/A N/A N/A N/A N/A N/A N/A N/A N/A N/A N/A N/A N/A N/A] Treatment Notes Electronic Signature(s) Signed: 01/05/2023 1:53:07 PM By: Geralyn Corwin DO Entered By: Geralyn Corwin on 01/05/2023 06:16:15 Brucks, Jeanee Park ([id number]) [id number] Page 4 of 8 -------------------------------------------------------------------------------- Multi-Disciplinary Care Plan Details Patient Name: Date of Service: [name]. 01/05/2023 8:45 A M Medical Record Number: [id number] Patient Account Number: [id number] Date of Birth/Sex: Treating RN: 1965/10/07 (57 y.o. [name], [name] Primary Care Andray Assefa: Antony Haste Other Clinician: Referring Brian Zeitlin: Treating Danice Dippolito/Extender: Avelina Laine in Treatment: 6 Active Inactive Necrotic Tissue Nursing Diagnoses: Knowledge deficit related to management of necrotic/devitalized tissue Goals: Necrotic/devitalized tissue will be minimized in the wound bed Date Initiated: 11/23/2022 Target Resolution Date: 01/22/2023 Goal Status: Active Patient/caregiver will verbalize understanding of reason and process for debridement of necrotic tissue Date Initiated:  11/23/2022 Target Resolution Date: 01/22/2023 Goal Status: Active Interventions: Assess patient pain level pre-, during and post procedure and prior to discharge Provide education on necrotic tissue and debridement process Treatment Activities: Biologic debridement : 11/23/2022 Excisional debridement : 11/23/2022 Notes: Nutrition Nursing Diagnoses: Potential for alteratiion in Nutrition/Potential for imbalanced nutrition Goals: Patient/caregiver agrees to and verbalizes understanding of need to obtain nutritional consultation Date Initiated: 11/23/2022 Date Inactivated: 12/22/2022 Target Resolution Date: 12/25/2022 Goal Status: Met Patient/caregiver will maintain therapeutic glucose control Date Initiated: 11/23/2022 Target Resolution Date: 01/22/2023 Goal Status: Active Interventions: Assess HgA1c results as ordered upon admission and as needed Provide education on elevated blood sugars and impact on wound healing Provide education on nutrition Treatment Activities: Obtain HgA1c : 11/23/2022 Patient referred to Primary Care Physician for further nutritional evaluation : 11/23/2022 Notes: Pain, Acute or Chronic Nursing Diagnoses: Potential alteration in comfort, pain Goals: Patient will verbalize adequate pain control and receive pain control interventions during procedures as needed Date Initiated:  11/23/2022 Target Resolution Date: 01/22/2023 Goal Status: Active Patient/caregiver will verbalize comfort level met Date Initiated: 11/23/2022 Target Resolution Date: 01/22/2023 ANJEL, NUNNALLY ([id number]) [phone].pdf Page 5 of 8 Goal Status: Active Interventions: Encourage patient to take pain medications as prescribed Provide education on pain management Reposition patient for comfort Treatment Activities: Administer pain control measures as ordered : 11/23/2022 Notes: Wound/Skin Impairment Nursing Diagnoses: Knowledge deficit related to  ulceration/compromised skin integrity Goals: Ulcer/skin breakdown will heal within 14 weeks Date Initiated: 11/23/2022 Target Resolution Date: 03/25/2023 Goal Status: Active Interventions: Assess patient/caregiver ability to perform ulcer/skin care regimen upon admission and as needed Assess ulceration(s) every visit Provide education on ulcer and skin care Treatment Activities: Skin care regimen initiated : 11/23/2022 Topical wound management initiated : 11/23/2022 Notes: Electronic Signature(s) Signed: 01/05/2023 3:48:55 PM By: Fonnie Mu RN Entered By: Fonnie Mu on 01/05/2023 06:14:33 -------------------------------------------------------------------------------- Pain Assessment Details Patient Name: Date of Service: [name]. 01/05/2023 8:45 A M Medical Record Number: [id number] Patient Account Number: [id number] Date of Birth/Sex: Treating RN: [birth year] (57 y.o. [name] Primary Care Jewett Mcgann: Antony Haste Other Clinician: Referring Shevy Yaney: Treating Yandel Zeiner/Extender: Avelina Laine in Treatment: 6 Active Problems Location of Pain Severity and Description of Pain Patient Has Paino Yes Site Locations Pain Location: Pain in Ulcers Rate the pain. Current Pain Level: 4 Mccleese, [name] ([id number]) [phone].pdf Page 6 of 8 Pain Management and Medication Current Pain Management: Medication: No Cold Application: No Rest: No Massage: No Activity: No T.E.N.S.: No Heat Application: No Leg drop or elevation: No Is the Current Pain Management Adequate: Adequate How does your wound impact your activities of daily livingo Sleep: No Bathing: No Appetite: No Relationship With Others: No Bladder Continence: No Emotions: No Bowel Continence: No Work: No Toileting: No Drive: No Dressing: No Hobbies: No Psychologist, prison and probation services) Signed: 01/05/2023 5:22:41 PM By: Shawn Stall RN,  BSN Entered By: Shawn Stall on 01/05/2023 05:31:49 -------------------------------------------------------------------------------- Patient/Caregiver Education Details Patient Name: Date of Service: [name]. 9/10/2024andnbsp8:45 A M Medical Record Number: [id number] Patient Account Number: [id number] Date of Birth/Gender: Treating RN: 08-29-1965 (57 y.o. [name], [name] Primary Care Physician: Antony Haste Other Clinician: Referring Physician: Treating Physician/Extender: Avelina Laine in Treatment: 6 Education Assessment Education Provided To: Patient Education Topics Provided Wound/Skin Impairment: Methods: Explain/Verbal Responses: Reinforcements needed, State content correctly Electronic Signature(s) Signed: 01/05/2023 3:48:55 PM By: Fonnie Mu RN Entered By: Fonnie Mu on 01/05/2023 06:14:43 -------------------------------------------------------------------------------- Wound Assessment Details Patient Name: Date of Service: [name]. 01/05/2023 8:45 A M Medical Record Number: [id number] Patient Account Number: [id number] Date of Birth/Sex: Treating RN: Apr 22, 1966 (57 y.o. [name] Primary Care Eriberto Felch: Antony Haste Other Clinician: Referring Irene Mitcham: Treating Terryl Niziolek/Extender: Avelina Laine in Treatment: 6 Wound Status Wound Number: 4 Primary Diabetic Wound/Ulcer of the Lower Extremity Etiology: [name], [name] ([id number]) [id number] Page 7 of 8 Etiology: Wound Location: Right, Lateral Ankle Wound Open Wounding Event: Trauma Status: Date Acquired: 07/27/2022 Comorbid Anemia, Sleep Apnea, Type II Diabetes, Gout, Osteoarthritis, Weeks Of Treatment: 6 History: Osteomyelitis, Neuropathy Clustered Wound: No Pending Amputation On Presentation Wound Measurements Length: (cm) 1.8 Width: (cm) 1.6 Depth: (cm) 0.1 Area: (cm)  2.262 Volume: (cm) 0.226 % Reduction in Area: 21.7% % Reduction in Volume: 60.9% Epithelialization: Small (1-33%) Tunneling: No Undermining: No Wound Description Classification: Grade 1 Wound Margin: Distinct, outline attached Exudate Amount: Medium Exudate Type: Serosanguineous Exudate Color: red, brown Foul  Odor After Cleansing: No Slough/Fibrino Yes Wound Bed Granulation Amount: Large (67-100%) Exposed Structure Granulation Quality: Pink Fascia Exposed: No Necrotic Amount: Small (1-33%) Fat Layer (Subcutaneous Tissue) Exposed: Yes Necrotic Quality: Adherent Slough Tendon Exposed: No Muscle Exposed: No Joint Exposed: No Bone Exposed: No Periwound Skin Texture Texture Color No Abnormalities Noted: No No Abnormalities Noted: No Callus: No Atrophie Blanche: No Crepitus: No Cyanosis: No Excoriation: No Ecchymosis: No Induration: No Erythema: No Rash: No Hemosiderin Staining: No Scarring: No Mottled: No Pallor: No Moisture Rubor: No No Abnormalities Noted: No Dry / Scaly: No Temperature / Pain Maceration: No Temperature: No Abnormality Treatment Notes Wound #4 (Ankle) Wound Laterality: Right, Lateral Cleanser Vashe 5.8 (oz) Discharge Instruction: Cleanse the wound with Vashe prior to applying a clean dressing using gauze sponges, not tissue or cotton balls. Peri-Wound Care Topical Santyl Collagenase Ointment, 30 (gm), tube Primary Dressing Hydrofera Blue Ready Transfer Foam, 4x5 (in/in) Discharge Instruction: Apply to wound bed as instructed Secondary Dressing Zetuvit Plus 4x8 in Discharge Instruction: Apply over primary dressing as directed. Secured With Compression Wrap Compression Stockings Facilities manager) Signed: 01/05/2023 5:22:41 PM By: Shawn Stall RN, BSN [name], [name] ([id number]) [id number] Page 8 of 8 Entered By: Shawn Stall on 01/05/2023  05:34:52 -------------------------------------------------------------------------------- Vitals Details Patient Name: Date of Service: [name] [name] NDR[facility] Park. 01/05/2023 8:45 A M Medical Record Number: [id number] Patient Account Number: [id number] Date of Birth/Sex: Treating RN: Dec 13, 1965 (57 y.o. [name], [name] Primary Care Franshesca Chipman: Antony Haste Other Clinician: Referring Kajsa Butrum: Treating Kortlynn Poust/Extender: Avelina Laine in Treatment: 6 Vital Signs Time Taken: 08:31 Temperature (F): 98.4 Height (in): 64 Pulse (bpm): 86 Weight (lbs): 261 Respiratory Rate (breaths/min): 20 Body Mass Index (BMI): 44.8 Blood Pressure (mmHg): 135/76 Capillary Blood Glucose (mg/dl): 401 Reference Range: 80 - 120 mg / dl Electronic Signature(s) Signed: 01/05/2023 5:22:41 PM By: Shawn Stall RN, BSN Entered By: Shawn Stall on 01/05/2023 05:31:26

## 2023-01-06 NOTE — Progress Notes (Signed)
[name], [name] ([id number]) [id number] Page 1 of 9 Visit Report for 12/29/2022 Chief Complaint Document Details Patient Name: Date of Service: [name]. 12/29/2022 2:30 PM Medical Record Number: [id number] Patient Account Number: [id number] Date of Birth/Sex: Treating RN: Park[birth year] (57 y.o. F) Primary Care Provider: Antony Park Other Clinician: Referring Provider: Treating Provider/Extender: [name] in Treatment: 5 Information Obtained from: Patient Chief Complaint 11/23/2022; right lateral ankle wound Electronic Signature(s) Signed: 12/29/2022 4:31:18 PM By: [name] Entered By: [name] Corwin on 12/29/2022 15:51:39 -------------------------------------------------------------------------------- Debridement Details Patient Name: Date of Service: [name]. 12/29/2022 2:30 PM Medical Record Number: [id number] Patient Account Number: [id number] Date of Birth/Sex: Treating RN: [name] (57 y.o. [name] Primary Care Provider: Antony Park Other Clinician: Referring Provider: Treating Provider/Extender: [name] in Treatment: 5 Debridement Performed for Assessment: Wound #4 Right,Lateral Ankle Performed By: Physician [name] Corwin, Park Debridement Type: Debridement Severity of Tissue Pre Debridement: Fat layer exposed Level of Consciousness (Pre-procedure): Awake and Alert Pre-procedure Verification/Time Out Yes - 15:52 Taken: Start Time: 15:53 Pain Control: Lidocaine 4% T opical Solution Percent of Wound Bed Debrided: 100% T Area Debrided (cm): otal 2.67 Tissue and other material debrided: Non-Viable, Slough, Slough Level: Non-Viable Tissue Debridement Description: Selective/Open Wound Instrument: Curette Bleeding: Minimum Hemostasis Achieved: Pressure Response to Treatment: Procedure was tolerated well Level of Consciousness (Post-  Awake and Alert procedure): Post Debridement Measurements of Total Wound Length: (cm) 2 Width: (cm) 1.7 Depth: (cm) 0.1 Volume: (cm) 0.267 Character of Wound/Ulcer Post Debridement: Improved Severity of Tissue Post Debridement: Fat layer exposed Post Procedure Diagnosis [name], [name] ([id number]) [id number] Page 2 of 9 Same as Pre-procedure Notes Scribed for Dr [name] by [name] Grills RN Electronic Signature(s) Signed: 12/29/2022 4:31:18 PM By: [name] Signed: 01/06/2023 7:59:39 AM By: [name] Entered By: [name] on 12/29/2022 15:53:45 -------------------------------------------------------------------------------- HPI Details Patient Name: Date of Service: [name]. 12/29/2022 2:30 PM Medical Record Number: [id number] Patient Account Number: [id number] Date of Birth/Sex: Treating RN: Park-02-02 (57 y.o. 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 Most recently she had an amputation of the right ring finger. She presents today with [name] wound to the right lateral ankle that she has had for 3 months that was caused by her hitting the area with her wheelchair. She has been Vashe dressings to clean the wound bed followed by antibiotic ointment. ABIs in office are 1.01 on the left and she has palpable dorsalis pedis and posterior tibial artery pulses. Her foot is warm and well-perfused. 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 She has been cleaning the area with Vashe and using Neosporin to the wound bed. She currently denies signs of  infection. Patient has [name] history of ankle surgery on 04-07-22 with right partial hardware removal and revision subtalar fusion. 8/6; patient presents for follow-up. She has been using Santyl to the wound bed and has been taking Augmentin and doxycycline without issues. She is scheduled for her MRI towards the end of the month. She has no issues or complaints. 8/13; patient presents for  follow-up. She is scheduled for MRI next week. She has been using Santyl and Hydrofera Blue to the wound bed. She is taking Augmentin and doxycycline without issues. She has been on her feet more recently and has noted slightly more swelling to the ankle and more erythema. She denies systemic signs of infection. 8/27; patient presents for follow-up. She is completed 4 weeks of oral antibiotics. She obtained her MRI of the right ankle however this has not been read yet. She denies signs of infection. Wound is smaller. She has been using Santyl and Hydrofera Blue. 9/3; patient presents for follow-up. She had an MRI completed on 8/27. Reading shows no definitive active osteomyelitis to the wound over the lateral ankle. It did notice partial tearing of the peroneus brevis and longus, partial tearing of the distal Achilles tendon and partial tearing of the tibialis posterior tendon. She has an appointment with her podiatrist based on these findings next week. She is been using Santyl and Hydrofera Blue to the wound bed. Along with her compression stockings. Wound is smaller. Electronic Signature(s) Signed: 12/29/2022 4:31:18 PM By: [name] Entered By: [name] Corwin on 12/29/2022 15:54:38 -------------------------------------------------------------------------------- Physical Exam Details Patient Name: Date of Service: [name] [name]. 12/29/2022 2:30 PM Medical Record Number: [id number] Patient Account Number: [id number] Date of Birth/Sex: Treating RN: [birth year] Park (57 y.o. F) Primary Care Provider:  Antony Park Other Clinician: Referring Provider: Treating Provider/Extender: [name] in Treatment: 5 Constitutional respirations regular, non-labored and within target range for patient.Marland Kitchen [name] ([id number]) [id number] Page 3 of 9 Cardiovascular 2+ dorsalis pedis/posterior tibialis pulses. Psychiatric pleasant and cooperative. Notes Right foot: T the lateral ankle there is an open wound with granulation tissue and slough to the wound bed. No purulent drainage. good edema control. No signs o of surrounding infection including increased warmth, erythema or purulent drainage. Electronic Signature(s) Signed: 12/29/2022 4:31:18 PM By: [name] Entered By: [name] Corwin on 12/29/2022 15:57:02 -------------------------------------------------------------------------------- Physician Orders Details Patient Name: Date of Service: [name]. 12/29/2022 2:30 PM Medical Record Number: [id number] Patient Account Number: [id number] Date of Birth/Sex: Treating RN: [birth year] (57 y.o. 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 - Please make appt. Return [name] Anesthetic (In clinic) Topical Lidocaine 4% applied to wound bed Bathing/ Shower/ Hygiene May shower with protection but Park not get wound dressing(s) wet. Protect dressing(s) with water repellant cover (for example,  large plastic bag) or [name] cast cover and may then take shower. Edema Control - Lymphedema / SCD / Other Elevate legs to the level of the heart or above for 30 minutes daily and/or when sitting for 3-4 times [name] day throughout the day. Avoid standing for long periods of time. Patient to wear own compression stockings every day. Wound Treatment Wound #4 - Ankle Wound Laterality: Right, Lateral Topical: Santyl Collagenase Ointment, 30 (gm), tube Prim Dressing: Hydrofera Blue Ready Transfer Foam, 4x5 (in/in) ary Discharge Instructions: Apply to wound bed as instructed Secondary Dressing: Zetuvit Plus 4x8 in Discharge Instructions: Apply over primary dressing  as directed. Electronic Signature(s) Signed: 12/29/2022 4:31:18 PM By: [name] Signed: 01/06/2023 7:59:39 AM By: [name] Entered By: [name] on 12/29/2022 16:05:03 [name], [name] ([id number]) [id number] Page 4 of 9 -------------------------------------------------------------------------------- Problem List Details Patient Name: Date of Service: [name]. 12/29/2022 2:30 PM Medical Record Number: [id number] Patient Account Number: [id number] Date of Birth/Sex: Treating RN: 10-May-Park (57 y.o. 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 12/29/2022 2:30 PM Medical Record Number: [id number] Patient Account Number: [id number] Date of Birth/Sex: Treating RN: Park-01-[birth year] (57 y.o. F) Primary Care Provider: Antony Park Other Clinician: Referring Provider: Treating Provider/Extender: [name] in Treatment: 5 Subjective Chief Complaint Information obtained from Patient 11/23/2022; right lateral ankle wound History of Present Illness (HPI) ABI on Right: 1.01 Ms. 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 Most recently she had an amputation of the right ring finger. She presents today with [name] wound to the right lateral ankle that she has Cimo, Marlita Park ([id number]) [phone].pdf Page 5 of 9 had for 3 months that was caused by her hitting the area with her wheelchair. She has been Vashe dressings to clean the wound bed followed by antibiotic ointment. ABIs in office are 1.01 on the left and she has palpable dorsalis pedis and posterior tibial artery pulses. Her foot is warm and well-perfused. 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 She has been cleaning the area with Vashe and using Neosporin to the wound bed. She currently denies signs of infection. Patient has [name] history of ankle surgery on 04-07-22 with right partial hardware removal and revision subtalar fusion. 8/6; patient presents for follow-up. She has been using Santyl to the wound bed and has been taking Augmentin and doxycycline without issues. She is scheduled for her MRI towards the end of the month. She has no issues or  complaints. 8/13; patient presents for follow-up. She is scheduled for MRI next week. She has been using Santyl and Hydrofera Blue to the wound bed. She is taking Augmentin and doxycycline without issues. She has been on her feet more recently and has noted slightly more swelling to the ankle and more erythema. She denies systemic signs of infection. 8/27; patient presents for follow-up. She is completed 4 weeks of oral antibiotics. She obtained her MRI of the right ankle however this has not been read yet. She denies signs of infection. Wound is smaller. She has been using Santyl and Hydrofera Blue. 9/3; patient presents for follow-up. She had an MRI completed on 8/27. Reading shows no definitive active osteomyelitis to the wound over the lateral ankle. It did notice partial tearing of the peroneus brevis and longus,  partial tearing of the distal Achilles tendon and partial tearing of the tibialis posterior tendon. She has an appointment with her podiatrist based on these findings next week. She is been using Santyl and Hydrofera Blue to the wound bed. Along with her compression stockings. Wound is smaller. Patient History Family History Cancer - Mother, Diabetes - Mother, Stroke - Father, No family history of Heart Disease, Hereditary Spherocytosis, Hypertension, Kidney Disease, Lung Disease, Seizures, Thyroid Problems, Tuberculosis. Social History Never smoker, Marital Status - Married, Alcohol Use - Rarely, Drug Use - No History, Caffeine Use - Rarely - coke. Medical History Eyes Denies history of Cataracts, Glaucoma, Optic Neuritis Ear/Nose/Mouth/Throat Denies history of Chronic sinus problems/congestion, Middle ear problems Hematologic/Lymphatic Patient has history of Anemia Denies history of Hemophilia, Human Immunodeficiency Virus, Lymphedema, Sickle Cell Disease Respiratory Patient has history of Sleep Apnea Denies history of Aspiration, Asthma, Chronic Obstructive Pulmonary  Disease (COPD), Pneumothorax, Tuberculosis Cardiovascular Denies history of Angina, Arrhythmia, Congestive Heart Failure, Coronary Artery Disease, Deep Vein Thrombosis, Hypertension, Hypotension, Myocardial Infarction, Peripheral Arterial Disease, Peripheral Venous Disease, Phlebitis, Vasculitis Gastrointestinal Denies history of Cirrhosis , Colitis, Crohns, Hepatitis [name], Hepatitis B, Hepatitis C Endocrine Patient has history of Type II Diabetes - 3/24 HgbA1c 12.2 Denies history of Type I Diabetes Genitourinary Denies history of End Stage Renal Disease Immunological Denies history of Lupus Erythematosus, Raynauds, Scleroderma Integumentary (Skin) Denies history of History of Burn Musculoskeletal Patient has history of Gout, Osteoarthritis, Osteomyelitis - fingers Denies history of Rheumatoid Arthritis Neurologic Patient has history of Neuropathy Denies history of Dementia, Quadriplegia, Paraplegia, Seizure Disorder Oncologic Denies history of Received Chemotherapy, Received Radiation Psychiatric Denies history of Anorexia/bulimia, Confinement Anxiety Hospitalization/Surgery History - left ankle reconstruction. - left ankle reconstruction. - left foot surgery (charcots). - right knee septic. - right knee replacement. - left knee replacement. - 11/05/2022 right ring finger amputation. - 07/02/2022 left middle finger amputation. - 08/03/2020 IandD abscess. - 09/2017 L BKA. - 2/19 right finger amputation. - right ankle reconstruction x3 surgeries 12/23. Medical [name] Surgical History Notes nd Respiratory acute bacterial bronchitis 05/16/2022 RSV Gastrointestinal GERD Musculoskeletal Charcot Joint right foot Objective Constitutional respirations regular, non-labored and within target range for patient.Marland Kitchen [name], [name] ([id number]) [id number] Page 6 of 9 Vitals Time Taken: 3:35 PM, Height: 64 in, Weight: 261 lbs, BMI: 44.8, Temperature: 99 F, Pulse: 91 bpm,  Respiratory Rate: 18 breaths/min, Blood Pressure: 115/70 mmHg. Cardiovascular 2+ dorsalis pedis/posterior tibialis pulses. Psychiatric pleasant and cooperative. General Notes: Right foot: T the lateral ankle there is an open wound with granulation tissue and slough to the wound bed. No purulent drainage. good edema o control. No signs of surrounding infection including increased warmth, erythema or purulent drainage. Integumentary (Hair, Skin) Wound #4 status is Open. Original cause of wound was Trauma. The date acquired was: 07/27/2022. The wound has been in treatment 5 weeks. The wound is located on the Right,Lateral Ankle. The wound measures 2cm length x 1.7cm width x 0.1cm depth; 2.67cm^2 area and 0.267cm^3 volume. There is Fat Layer (Subcutaneous Tissue) exposed. There is no tunneling or undermining noted. There is [name] medium amount of serosanguineous drainage noted. The wound margin is distinct with the outline attached to the wound base. There is medium (34-66%) pink granulation within the wound bed. There is [name] medium (34-66%) amount of necrotic tissue within the wound bed including Adherent Slough. The periwound skin appearance did not exhibit: Callus, Crepitus, Excoriation, Induration, Rash, Scarring, Dry/Scaly, Maceration, Atrophie Blanche, Cyanosis, Ecchymosis, Hemosiderin Staining, Mottled, Pallor, Rubor, Erythema.  Periwound temperature was noted as No Abnormality. 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 No signs of active osteomyelitis on MRI. Patient is following up with her podiatrist based on the incidental findings noted on MRI. For now I recommended  continuing Santyl and Hydrofera Blue under compression stocking daily. Procedures Wound #4 Pre-procedure diagnosis of Wound #4 is [name] Diabetic Wound/Ulcer of the Lower Extremity located on the Right,Lateral Ankle .Severity of Tissue Pre Debridement is: Fat layer exposed. There was [name] Selective/Open Wound Non-Viable Tissue Debridement with [name] total area of 2.67 sq cm performed by [name] Corwin, Park. With the following instrument(s): Curette to remove Non-Viable tissue/material. Material removed includes [facility] after achieving pain control using Lidocaine 4% Topical Solution. No specimens were taken. [name] time out was conducted at 15:52, prior to the start of the procedure. [name] Minimum amount of bleeding was controlled with Pressure. The procedure was tolerated well. Post Debridement Measurements: 2cm length x 1.7cm width x 0.1cm depth; 0.267cm^3 volume. Character of Wound/Ulcer Post Debridement is improved. Severity of Tissue Post Debridement is: Fat layer exposed. Post procedure Diagnosis Wound #4: Same as Pre-Procedure General Notes: Scribed for Dr [name] by [name] Grills RN. Plan Follow-up Appointments: Return Appointment in 1 week. - Please make appt. Anesthetic: (In clinic) Topical Lidocaine 4% applied to wound bed Bathing/ Shower/ Hygiene: May shower with protection but Park not get wound dressing(s) wet. Protect dressing(s) with water repellant cover (for example, large plastic bag) or [name] cast cover and may then take shower. Edema Control - Lymphedema / SCD / Other: Elevate legs to the level of the heart or above for 30 minutes daily and/or when sitting for 3-4 times [name] day throughout the day. Avoid standing for long periods of time. Patient to wear own compression stockings every day. WOUND #4: - Ankle Wound Laterality: Right, Lateral Topical: Santyl Collagenase Ointment, 30 (gm), tube Prim Dressing: Hydrofera Blue Ready Transfer Foam, 4x5 (in/in) ary Discharge Instructions: Apply to wound  bed as instructed Secondary Dressing: Zetuvit Plus 4x8 in Discharge Instructions: Apply over primary dressing as directed. 1. Santyl and Hydrofera Blue 2. Compression stockings daily 3. Follow-up in 1 week [name], [name] ([id number]) [phone].pdf Page 7 of 9 Electronic Signature(s) Signed: 12/30/2022 6:09:26 PM By: Shawn Stall RN, BSN Signed: 12/31/2022 5:29:38 PM By: [name] Previous Signature: 12/29/2022 4:31:18 PM Version By: [name] Entered By: Shawn Stall on 12/30/2022 10:35:19 -------------------------------------------------------------------------------- HxROS Details Patient Name: Date of Service: [name], [name] LEXA NDRA Park. 12/29/2022 2:30 PM Medical Record Number: [id number] Patient Account Number: [id number] Date of Birth/Sex: Treating RN: 10[birth year]-Park (57 y.o. F) Primary Care Provider: Antony Park Other Clinician: Referring Provider: Treating Provider/Extender: [name] in Treatment: 5 Eyes Medical History: Negative for: Cataracts; Glaucoma; Optic Neuritis Ear/Nose/Mouth/Throat Medical History: Negative for: Chronic sinus problems/congestion; Middle ear problems Hematologic/Lymphatic Medical History: Positive for: Anemia Negative for: Hemophilia; Human Immunodeficiency Virus; Lymphedema; Sickle Cell Disease Respiratory Medical History: Positive for: Sleep Apnea Negative for: Aspiration; Asthma; Chronic Obstructive Pulmonary Disease (COPD); Pneumothorax; Tuberculosis Past Medical History Notes: acute bacterial bronchitis 05/16/2022 RSV Cardiovascular Medical History: Negative for: Angina; Arrhythmia; Congestive Heart Failure; Coronary Artery Disease; Deep Vein Thrombosis; Hypertension; Hypotension; Myocardial Infarction; Peripheral Arterial Disease; Peripheral  Venous Disease; Phlebitis; Vasculitis Gastrointestinal Medical History: Negative for: Cirrhosis ; Colitis; Crohns; Hepatitis [name];  Hepatitis B; Hepatitis C Past Medical History Notes: GERD Endocrine Medical History: Positive for: Type II Diabetes - 3/24 HgbA1c 12.2 Negative for: Type I Diabetes Time with diabetes: 1 year ago Treated with: Insulin, Diet Blood sugar tested every day: Yes Tested : x3 [name] day Genitourinary Medical History: Negative for: End Stage Renal Disease Immunological [name], [name] ([id number]) [phone].pdf Page 8 of 9 Medical History: Negative for: Lupus Erythematosus; Raynauds; Scleroderma Integumentary (Skin) Medical History: Negative for: History of Burn Musculoskeletal Medical History: Positive for: Gout; Osteoarthritis; Osteomyelitis - fingers Negative for: Rheumatoid Arthritis Past Medical History Notes: Charcot Joint right foot Neurologic Medical History: Positive for: Neuropathy Negative for: Dementia; Quadriplegia; Paraplegia; Seizure Disorder Oncologic Medical History: Negative for: Received Chemotherapy; Received Radiation Psychiatric Medical History: Negative for: Anorexia/bulimia; Confinement Anxiety Immunizations Pneumococcal Vaccine: Received Pneumococcal Vaccination: No Immunization Notes: last tetanus shot 6 years ago Implantable Devices No devices added Hospitalization / Surgery History Type of Hospitalization/Surgery left ankle reconstruction left ankle reconstruction left foot surgery (charcots) right knee septic right knee replacement left knee replacement 11/05/2022 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/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 IandD abscess 09/2017 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 right ankle reconstruction x3 surgeries 12/23 Family and Social History Cancer: Yes - Mother; Diabetes: Yes - Mother; Heart Disease: No; Hereditary Spherocytosis: No; Hypertension: No; Kidney Disease: No; Lung Disease: No; Seizures: No; Stroke: Yes - Father; Thyroid Problems: No; Tuberculosis: No; Never smoker; Marital  Status - Married; Alcohol Use: Rarely; Drug Use: No History; Caffeine Use: Rarely - coke; Financial Concerns: No; Food, Clothing or Shelter Needs: No; Support System Lacking: No; Transportation Concerns: No Electronic Signature(s) Signed: 12/29/2022 4:31:18 PM By: [name] Entered By: [name] Corwin on 12/29/2022 15:55:31 SuperBill Details -------------------------------------------------------------------------------- [name] ([id number]) [id number] Page 9 of 9 Patient Name: Date of Service: [name]. 12/29/2022 Medical Record Number: [id number] Patient Account Number: [id number] Date of Birth/Sex: Treating RN: [birth year] (57 y.o. F) Primary Care Provider: Antony Park Other Clinician: Referring Provider: Treating Provider/Extender: [name] in Treatment: 5 Diagnosis Coding ICD-10 Codes Code Description [phone] Type 2 diabetes mellitus with foot ulcer Z89.512 Acquired absence of left leg below knee X32.355D Complete traumatic transphalangeal amputation of right ring finger, initial encounter L97.518 Non-pressure chronic ulcer of other part of right foot with other specified severity Facility Procedures CPT4 Code Description Modifier Quantity [id number] [phone] - DEBRIDE WOUND 1ST 20 SQ CM OR < 1 ICD-10 Diagnosis Description E11.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

## 2023-01-12 ENCOUNTER — Encounter (HOSPITAL_BASED_OUTPATIENT_CLINIC_OR_DEPARTMENT_OTHER): Payer: Commercial Managed Care - HMO | Admitting: Internal Medicine

## 2023-01-12 DIAGNOSIS — E11621 Type 2 diabetes mellitus with foot ulcer: Secondary | ICD-10-CM | POA: Diagnosis not present

## 2023-01-13 ENCOUNTER — Encounter (HOSPITAL_COMMUNITY): Payer: Self-pay | Admitting: Orthopedic Surgery

## 2023-01-13 ENCOUNTER — Other Ambulatory Visit: Payer: Self-pay

## 2023-01-13 NOTE — Progress Notes (Signed)
PCP - [facility] Medicine Cardiologist - n/a  Chest x-ray - 04/28/22 [facility] EKG - 07/01/22 Stress Test - n/a ECHO - 09/09/17 [facility] Cardiac Cath - n/a  ICD Pacemaker/Loop - n/a  Sleep Study -  Yes CPAP - does not use CPAP  Diabetes Type 2 Do not take Metformin or Glipizide on the morning of surgery.  Hold Farxiga for 72 hours prior to surgery.  Last dose was on 01/13/23. (SDW call)  Hold Trulicity 7 days prior to procedure.  Last dose was on 01/11/23. (SDW call)      THE MORNING OF SURGERY, take 30 units of Insulin Glargine.   The day of surgery, do not take Humalog Insulin on the morning of surgery unless your CBG is greater than 220 mg/dL.  If your CBG is greater than 220 mg/dL, you may take  of your sliding scale (correction) dose of insulin.  If your blood sugar is less than 70 mg/dL, you will need to treat for low blood sugar: Treat a low blood sugar (less than 70 mg/dL) with  cup of clear juice (cranberry or apple), 4 glucose tablets, OR glucose gel. Recheck blood sugar in 15 minutes after treatment (to make sure it is greater than 70 mg/dL). If your blood sugar is not greater than 70 mg/dL on recheck, call [phone] for further instructions.  ERAS: Clear liquids til 12:45 PM DOS.  Anesthesia review: Yes  STOP now taking any Aspirin (unless otherwise instructed by your surgeon), Aleve, Naproxen, Voltaren, Ibuprofen, Motrin, Advil, Goody's, BC's, all herbal medications, fish oil, and all vitamins.   Coronavirus Screening Do you have any of the following symptoms:  Cough yes/no: No Fever (>100.40F)  yes/no: No Runny nose yes/no: No Sore throat yes/no: No Difficulty breathing/shortness of breath  yes/no: No  Have you traveled in the last 14 days and where? yes/no: No  Patient verbalized understanding of instructions that were given via phone.

## 2023-01-13 NOTE — Anesthesia Preprocedure Evaluation (Signed)
Anesthesia Evaluation  Patient identified by MRN, date of birth, ID band Patient awake    Reviewed: Allergy & Precautions, H&P , NPO status , Patient's Chart, lab work & pertinent test results  Airway Mallampati: II  TM Distance: <3 FB Neck ROM: Full    Dental no notable dental hx.    Pulmonary sleep apnea    Pulmonary exam normal breath sounds clear to auscultation       Cardiovascular negative cardio ROS Normal cardiovascular exam Rhythm:Regular Rate:Normal     Neuro/Psych negative neurological ROS  negative psych ROS   GI/Hepatic Neg liver ROS,GERD  ,,  Endo/Other  diabetes, Poorly Controlled, Type 2  Morbid obesity  Renal/GU negative Renal ROS  negative genitourinary   Musculoskeletal negative musculoskeletal ROS (+)    Abdominal   Peds negative pediatric ROS (+)  Hematology negative hematology ROS (+)   Anesthesia Other Findings   Reproductive/Obstetrics negative OB ROS                             Anesthesia Physical Anesthesia Plan  ASA: 3  Anesthesia Plan: MAC   Post-op Pain Management:    Induction: Intravenous  PONV Risk Score and Plan: 2 and Propofol infusion and Treatment may vary due to age or medical condition  Airway Management Planned: Nasal Cannula  Additional Equipment:   Intra-op Plan:   Post-operative Plan:   Informed Consent: I have reviewed the patients History and Physical, chart, labs and discussed the procedure including the risks, benefits and alternatives for the proposed anesthesia with the patient or authorized representative who has indicated his/her understanding and acceptance.     Dental advisory given  Plan Discussed with: CRNA and Surgeon  Anesthesia Plan Comments: (PAT note written 01/13/2023 by Shonna Chock, PA-C.  )       Anesthesia Quick Evaluation

## 2023-01-13 NOTE — Progress Notes (Addendum)
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  She is s/p right ring finger amputation on 11/05/22. See my previously Anesthesia APP note for Date of Service 11/04/22.   Appears she was just evaluated by [name] on 01/12/23 with surgery posted for 01/14/23 for osteomyelitis. Given late posting, last Trulicity is documented as taking on 01/11/23. Last Marcelline Deist 01/13/23. I did leave a voice message with [name] at [name] office regarding last Trulicity date and anesthesia considerations. (UPDATE 01/13/23 3:10 PM: Message received indicating case was urgent and should not be delayed.)  Anesthesia team to evaluate on the day of surgery.  VS: Ht 5\' 4"  (1.626 m)   Wt 120.2 kg   LMP 04/26/2016   BMI 45.49 kg/m  BP Readings from Last 3 Encounters:  11/05/22 124/68  07/06/22 120/64  03/18/22 108/82   Pulse Readings from Last 3 Encounters:  11/05/22 74  07/06/22 81  03/18/22 84    PROVIDERS: [name] Inch, MD is PCP    LABS: For day of surgery as indicated.  Last results in [facility] include: Lab Results  Component Value Date   WBC 9.4 11/05/2022   HGB 10.9 (L) 11/05/2022   HCT 35.9 (L) 11/05/2022   PLT 280 11/05/2022   GLUCOSE 157 (H) 11/05/2022   ALT 24 07/01/2022   AST 30 07/01/2022   NA 140 11/05/2022   K 4.3 11/05/2022   CL 101 11/05/2022   CREATININE 1.03 (H) 11/05/2022   BUN 19 11/05/2022   CO2 21 (L) 11/05/2022  A1c 8.8% on 08/05/2022 ([facility])  IMAGES:   EKG: 07/01/22: Sinus rhythm Low voltage, precordial leads No significant change since last tracing 12 July 2017 Confirmed by [name] [phone]) on  07/02/2022 10:46:21 AM     CV: Echo 09/09/17 ([facility]): NORMAL LEFT VENTRICULAR SYSTOLIC FUNCTION WITH MILD LVH  NORMAL RIGHT VENTRICULAR SYSTOLIC FUNCTION  VALVULAR REGURGITATION: MILD MR, MILD PR, MILD TR  VALVULAR STENOSIS: TRIVIAL AS  NO OBVIOUS SIGN OF ENDOCARDITIS IN THE SETTING OF POOR SOUND TRANSMISSION  NO PRIOR STUDY FOR COMPARISON    US Carotid 03/17/12: Summary:  Bilateral: mild soft plaque noted origin ICA. No signifcant  ICA stenosis. Vertebral artery flow is antegrade.     Past Medical History:  Diagnosis Date   Acute bacterial bronchitis 05/16/2022   in [facility]   Anemia    hx   Arthritis    Charcot's joint of right foot    Hx - DM 2   Depression 02/21/2018   patient denies this dx - pt states that she uses Cymbalta for pain mgmt along with Voltaren   Diabetes mellitus without complication (HCC)    type 2   DOE (dyspnea on exertion) 07/12/2017   FUO (fever of unknown origin) 07/12/2017   Gastroenteritis 04/2016   GERD (gastroesophageal reflux disease)    Headache(784.0)    hormonal headache - no problems since menopause   HLD (hyperlipidemia)    crestor   Low back pain 07/12/2017   Neuromuscular disorder (HCC)    neuropathy - right foot and hands   Obese    Pleuritic chest  pain 07/12/2017   Pneumonia    hx 20+ years ago   RSV infection 03/2022   resolved   Sleep apnea    does not use CPAP   Ulcer of foot (HCC)    toe ulcer greater than a year ago    Past Surgical History:  Procedure Laterality Date   AMPUTATION Right 06/11/2017   Procedure: RIGHT LONG FINGER AMPUTATION DIGIT;  Surgeon: Mack Hook, MD;  Location: [facility] OR;  Service: Orthopedics;  Laterality: Right;   AMPUTATION Right 02/22/2018   Procedure: AMPUTATION RIGHT INDEX FINGER;  Surgeon: Knute Neu, MD;  Location: [facility]S;  Service: Plastics;  Laterality: Right;   AMPUTATION Left 07/02/2022   Procedure: AMPUTATION OF LEFT MIDDLE FINGER;  Surgeon: [name] Beards, MD;  Location: [facility]  OR;  Service: Orthopedics;  Laterality: Left;   AMPUTATION Right 11/05/2022   Procedure: Right ring finger amputation;  Surgeon: [name] Beards, MD;  Location[facility];  Service: Orthopedics;  Laterality: Right;  local 60   ANKLE FUSION Left 08/03/2014   Procedure: Left Tibiocalcaneal Fusion;  Surgeon: Nadara Mustard, MD;  Location[facility];  Service: Orthopedics;  Laterality: Left;   APPLICATION OF WOUND VAC Left 08/03/2014   Procedure: APPLICATION OF WOUND VAC;  Surgeon: Nadara Mustard, MD;  [facility];  Service: Orthopedics;  Laterality: Left;   BELOW KNEE LEG AMPUTATION Left 10/08/2017   in [facility]   I & D EXTREMITY Left 08/01/2020   Procedure: IRRIGATION AND DEBRIDEMENT EXTREMITY,PARTIAL AMPUTATION LEFT INDEX FINGER;  Surgeon: Ernest Mallick, MD;  Location: [facility];  Service: Orthopedics;  Laterality: Left;   I & D EXTREMITY Left 08/06/2020   Procedure: IRRIGATION AND DEBRIDEMENT EXTREMITY;  Surgeon: Ernest Mallick, MD;  Location: [facility];  Service: Orthopedics;  Laterality: Left;   I & D KNEE WITH POLY EXCHANGE Right 01/31/2013   Procedure: IRRIGATION AND DEBRIDEMENT KNEE WITH POLY EXCHANGE possible Antibiotic Spacer;  Surgeon: Valeria Batman, MD;  [facility];  Service: Orthopedics;  Laterality: Right;   INCISION AND DRAINAGE Right 01/31/2013   ANTIBIOTIC SPACER  RIGHT KNEE  DR [facility]    INCISION AND DRAINAGE ABSCESS Left 08/03/2020   Procedure: INCISION AND DEBRIDEMENT LEFT INDEX FINGER;  Surgeon: Ernest Mallick, MD;  Location: [facility];  Service: Orthopedics;  Laterality: Left;   KNEE ARTHROSCOPY Left    MRI  04/07/2019   in [facility]   ORIF ANKLE FRACTURE Left 03/16/2014   Procedure: Foot Excision Charcot Collapse,  Internal Fixation;  Surgeon: Nadara Mustard, MD;  [facility];  Service: Orthopedics;  Laterality: Left;   REPLACEMENT TOTAL KNEE Left    TOTAL KNEE ARTHROPLASTY  06/23/2011   Procedure: RIGHT TOTAL KNEE ARTHROPLASTY;  Surgeon: Valeria Batman,  MD;  Location: [facility] OR;  Service: Orthopedics;  Laterality: Right;  RIGHT TOTAL KNEE REPLACEMENT    MEDICATIONS: No current facility-administered medications for this encounter.    dapagliflozin propanediol (FARXIGA) 10 MG TABS tablet   glipiZIDE (GLUCOTROL XL) 10 MG 24 hr tablet   Blood Glucose Monitoring Suppl DEVI   diclofenac (VOLTAREN) 75 MG EC tablet   Dulaglutide (TRULICITY) 1.5 MG/0.5ML SOPN   DULoxetine (CYMBALTA) 60 MG capsule   gabapentin (NEURONTIN) 600 MG tablet   GLUCOSAMINE CHONDROITIN MSM PO   glucosamine-chondroitin 500-400 MG tablet   Glucose Blood (BLOOD GLUCOSE TEST STRIPS) STRP   HYDROcodone-acetaminophen (NORCO) 10-325 MG tablet   Insulin Glargine (BASAGLAR KWIKPEN) 100 UNIT/ML   insulin lispro (  HUMALOG) 100 UNIT/ML injection   Insulin Pen Needle (PEN NEEDLES) 31G X 5 MM MISC   Lancet Device MISC   Lancets MISC   metFORMIN (GLUCOPHAGE) 1000 MG tablet   Multiple Vitamin (MULTIVITAMIN WITH MINERALS) TABS   oxybutynin (DITROPAN-XL) 10 MG 24 hr tablet   pantoprazole (PROTONIX) 40 MG tablet   rosuvastatin (CRESTOR) 20 MG tablet    Shonna Chock, PA-C Surgical Short Stay/Anesthesiology [facility] Phone [phone] [facility] Phone [phone] 01/13/2023 1:15 PM

## 2023-01-14 ENCOUNTER — Other Ambulatory Visit: Payer: Self-pay

## 2023-01-14 ENCOUNTER — Ambulatory Visit (HOSPITAL_COMMUNITY)
Admission: RE | Admit: 2023-01-14 | Discharge: 2023-01-14 | Disposition: A | Discharge status: Home / Self Care | Payer: Commercial Managed Care - HMO | Attending: Orthopedic Surgery | Admitting: Orthopedic Surgery

## 2023-01-14 ENCOUNTER — Encounter (HOSPITAL_COMMUNITY)
Admission: RE | Disposition: A | Discharge status: Home / Self Care | Payer: Self-pay | Source: Home / Self Care | Attending: Orthopedic Surgery

## 2023-01-14 ENCOUNTER — Ambulatory Visit (HOSPITAL_COMMUNITY): Payer: Commercial Managed Care - HMO | Admitting: Physician Assistant

## 2023-01-14 ENCOUNTER — Encounter (HOSPITAL_COMMUNITY): Payer: Self-pay | Admitting: Orthopedic Surgery

## 2023-01-14 DIAGNOSIS — Z6841 Body Mass Index (BMI) 40.0 and over, adult: Secondary | ICD-10-CM | POA: Insufficient documentation

## 2023-01-14 DIAGNOSIS — Z7985 Long-term (current) use of injectable non-insulin antidiabetic drugs: Secondary | ICD-10-CM | POA: Diagnosis not present

## 2023-01-14 DIAGNOSIS — E1136 Type 2 diabetes mellitus with diabetic cataract: Secondary | ICD-10-CM | POA: Diagnosis not present

## 2023-01-14 DIAGNOSIS — Z7984 Long term (current) use of oral hypoglycemic drugs: Secondary | ICD-10-CM | POA: Insufficient documentation

## 2023-01-14 DIAGNOSIS — M869 Osteomyelitis, unspecified: Secondary | ICD-10-CM

## 2023-01-14 DIAGNOSIS — E1165 Type 2 diabetes mellitus with hyperglycemia: Secondary | ICD-10-CM | POA: Diagnosis not present

## 2023-01-14 DIAGNOSIS — K219 Gastro-esophageal reflux disease without esophagitis: Secondary | ICD-10-CM | POA: Diagnosis not present

## 2023-01-14 DIAGNOSIS — E11610 Type 2 diabetes mellitus with diabetic neuropathic arthropathy: Secondary | ICD-10-CM | POA: Insufficient documentation

## 2023-01-14 DIAGNOSIS — Z794 Long term (current) use of insulin: Secondary | ICD-10-CM | POA: Insufficient documentation

## 2023-01-14 DIAGNOSIS — M86142 Other acute osteomyelitis, left hand: Secondary | ICD-10-CM | POA: Diagnosis not present

## 2023-01-14 HISTORY — PX: AMPUTATION: SHX166

## 2023-01-14 LAB — CBC
HCT: 39.8 % (ref 36.0–46.0)
Hemoglobin: 12.2 g/dL (ref 12.0–15.0)
MCH: 27.2 pg (ref 26.0–34.0)
MCHC: 30.7 g/dL (ref 30.0–36.0)
MCV: 88.8 fL (ref 80.0–100.0)
Platelets: 294 10*3/uL (ref 150–400)
RBC: 4.48 MIL/uL (ref 3.87–5.11)
RDW: 15.5 % (ref 11.5–15.5)
WBC: 8.1 10*3/uL (ref 4.0–10.5)
nRBC: 0 % (ref 0.0–0.2)

## 2023-01-14 LAB — BASIC METABOLIC PANEL
Anion gap: 15 (ref 5–15)
BUN: 18 mg/dL (ref 6–20)
CO2: 21 mmol/L — ABNORMAL LOW (ref 22–32)
Calcium: 9.4 mg/dL (ref 8.9–10.3)
Chloride: 102 mmol/L (ref 98–111)
Creatinine, Ser: 0.93 mg/dL (ref 0.44–1.00)
GFR, Estimated: >60 mL/min (ref >60)
Glucose, Bld: 96 mg/dL (ref 70–99)
Potassium: 4.2 mmol/L (ref 3.5–5.1)
Sodium: 138 mmol/L (ref 135–145)

## 2023-01-14 LAB — GLUCOSE, CAPILLARY
Glucose-Capillary: 94 mg/dL (ref 70–99)
Glucose-Capillary: 87 mg/dL (ref 70–99)
Glucose-Capillary: 97 mg/dL (ref 70–99)

## 2023-01-14 SURGERY — AMPUTATION DIGIT
Anesthesia: Monitor Anesthesia Care | Site: Finger | Laterality: Left

## 2023-01-14 MED ORDER — DEXMEDETOMIDINE HCL IN NACL 400 MCG/100ML IV SOLN
INTRAVENOUS | Status: DC | PRN
Start: 2023-01-14 — End: 2023-01-14
  Administered 2023-01-14: 8 ug via INTRAVENOUS

## 2023-01-14 MED ORDER — ONDANSETRON HCL 4 MG/2ML IJ SOLN
INTRAMUSCULAR | Status: AC
  Filled 2023-01-14: qty 2

## 2023-01-14 MED ORDER — BUPIVACAINE HCL (PF) 0.25 % IJ SOLN
INTRAMUSCULAR | Status: DC | PRN
  Administered 2023-01-14: 10 mL

## 2023-01-14 MED ORDER — LIDOCAINE 2% (20 MG/ML) 5 ML SYRINGE
INTRAMUSCULAR | Status: DC | PRN
  Administered 2023-01-14: 20 mg via INTRAVENOUS

## 2023-01-14 MED ORDER — CHLORHEXIDINE GLUCONATE 0.12 % MT SOLN: 15.0000 mL | Freq: Once | OROMUCOSAL | Status: DC

## 2023-01-14 MED ORDER — BACITRACIN ZINC 500 UNIT/GM EX OINT
TOPICAL_OINTMENT | CUTANEOUS | Status: AC
  Filled 2023-01-14: qty 28.35

## 2023-01-14 MED ORDER — FENTANYL CITRATE (PF) 100 MCG/2ML IJ SOLN
INTRAMUSCULAR | Status: AC
  Filled 2023-01-14: qty 2

## 2023-01-14 MED ORDER — BUPIVACAINE HCL (PF) 0.25 % IJ SOLN
INTRAMUSCULAR | Status: AC
  Filled 2023-01-14: qty 30

## 2023-01-14 MED ORDER — FENTANYL CITRATE (PF) 100 MCG/2ML IJ SOLN
INTRAMUSCULAR | Status: DC | PRN
  Administered 2023-01-14 (×2): 50 ug via INTRAVENOUS

## 2023-01-14 MED ORDER — CHLORHEXIDINE GLUCONATE 0.12 % MT SOLN
OROMUCOSAL | Status: AC
  Filled 2023-01-14: qty 15

## 2023-01-14 MED ORDER — ORAL CARE MOUTH RINSE: 15.0000 mL | Freq: Once | OROMUCOSAL | Status: DC

## 2023-01-14 MED ORDER — MIDAZOLAM HCL 2 MG/2ML IJ SOLN
INTRAMUSCULAR | Status: DC | PRN
  Administered 2023-01-14: 2 mg via INTRAVENOUS

## 2023-01-14 MED ORDER — BACITRACIN ZINC 500 UNIT/GM EX OINT
TOPICAL_OINTMENT | CUTANEOUS | Status: DC | PRN
Start: 2023-01-14 — End: 2023-01-14
  Administered 2023-01-14: 1 via TOPICAL

## 2023-01-14 MED ORDER — PROPOFOL 10 MG/ML IV BOLUS
INTRAVENOUS | Status: DC | PRN
  Administered 2023-01-14: 30 mg via INTRAVENOUS

## 2023-01-14 MED ORDER — PROPOFOL 1000 MG/100ML IV EMUL
INTRAVENOUS | Status: AC
  Filled 2023-01-14: qty 100

## 2023-01-14 MED ORDER — AMOXICILLIN-POT CLAVULANATE 500-125 MG PO TABS: 1.0000 | ORAL_TABLET | Freq: Three times a day (TID) | ORAL | 0 refills | Status: AC

## 2023-01-14 MED ORDER — ONDANSETRON HCL 4 MG/2ML IJ SOLN
INTRAMUSCULAR | Status: DC | PRN
  Administered 2023-01-14: 4 mg via INTRAVENOUS

## 2023-01-14 MED ORDER — PROPOFOL 500 MG/50ML IV EMUL
INTRAVENOUS | Status: DC | PRN
  Administered 2023-01-14: 100 ug/kg/min via INTRAVENOUS

## 2023-01-14 MED ORDER — DOXYCYCLINE HYCLATE 50 MG PO CAPS: 50.0000 mg | ORAL_CAPSULE | Freq: Two times a day (BID) | ORAL | 0 refills | Status: AC

## 2023-01-14 MED ORDER — LIDOCAINE 2% (20 MG/ML) 5 ML SYRINGE
INTRAMUSCULAR | Status: AC
  Filled 2023-01-14: qty 5

## 2023-01-14 MED ORDER — LACTATED RINGERS IV SOLN: INTRAVENOUS | Status: DC

## 2023-01-14 MED ORDER — MIDAZOLAM HCL 2 MG/2ML IJ SOLN
INTRAMUSCULAR | Status: AC
  Filled 2023-01-14: qty 2

## 2023-01-14 SURGICAL SUPPLY — 52 items
BAG COUNTER SPONGE SURGICOUNT (BAG) ×1 IMPLANT
BAG SPNG CNTER NS LX DISP (BAG)
BNDG CMPR 5X3 KNIT ELC UNQ LF (GAUZE/BANDAGES/DRESSINGS)
BNDG CMPR 75X21 PLY HI ABS (MISCELLANEOUS) ×1
BNDG COHESIVE 1X5 TAN STRL LF (GAUZE/BANDAGES/DRESSINGS) ×1 IMPLANT
BNDG ELASTIC 2X5.8 VLCR STR LF (GAUZE/BANDAGES/DRESSINGS) ×1 IMPLANT
BNDG ELASTIC 3INX 5YD STR LF (GAUZE/BANDAGES/DRESSINGS) IMPLANT
BNDG ELASTIC 4X5.8 VLCR STR LF (GAUZE/BANDAGES/DRESSINGS) IMPLANT
BNDG GAUZE DERMACEA FLUFF 4 (GAUZE/BANDAGES/DRESSINGS) ×1 IMPLANT
BNDG GZE DERMACEA 4 6PLY (GAUZE/BANDAGES/DRESSINGS)
CORD BIPOLAR FORCEPS 12FT (ELECTRODE) ×1 IMPLANT
COVER SURGICAL LIGHT HANDLE (MISCELLANEOUS) ×1 IMPLANT
CUFF TOURN SGL QUICK 18X4 (TOURNIQUET CUFF) ×1 IMPLANT
CUFF TOURN SGL QUICK 24 (TOURNIQUET CUFF)
CUFF TRNQT CYL 24X4X16.5-23 (TOURNIQUET CUFF) IMPLANT
DRAIN PENROSE 0.25X18 (DRAIN) IMPLANT
DRAPE SURG 17X23 STRL (DRAPES) ×1 IMPLANT
DRSG ADAPTIC 3X8 NADH LF (GAUZE/BANDAGES/DRESSINGS) ×1 IMPLANT
DRSG XEROFORM 1X8 (GAUZE/BANDAGES/DRESSINGS) IMPLANT
GAUZE SPONGE 2X2 8PLY STRL LF (GAUZE/BANDAGES/DRESSINGS) IMPLANT
GAUZE SPONGE 4X4 12PLY STRL (GAUZE/BANDAGES/DRESSINGS) IMPLANT
GAUZE STRETCH 2X75IN STRL (MISCELLANEOUS) IMPLANT
GLOVE BIO SURGEON STRL SZ7 (GLOVE) ×1 IMPLANT
GLOVE SURG UNDER POLY LF SZ7 (GLOVE) ×1 IMPLANT
GOWN STRL REUS W/ TWL LRG LVL3 (GOWN DISPOSABLE) ×2 IMPLANT
GOWN STRL REUS W/ TWL XL LVL3 (GOWN DISPOSABLE) ×1 IMPLANT
GOWN STRL REUS W/TWL LRG LVL3 (GOWN DISPOSABLE) ×1
GOWN STRL REUS W/TWL XL LVL3 (GOWN DISPOSABLE) ×1
KIT BASIN OR (CUSTOM PROCEDURE TRAY) ×1 IMPLANT
KIT TURNOVER KIT B (KITS) ×1 IMPLANT
MANIFOLD NEPTUNE II (INSTRUMENTS) ×1 IMPLANT
NDL 18GX1X1/2 (RX/OR ONLY) (NEEDLE) IMPLANT
NDL HYPO 25GX1X1/2 BEV (NEEDLE) IMPLANT
NEEDLE 18GX1X1/2 (RX/OR ONLY) (NEEDLE) ×1 IMPLANT
NEEDLE HYPO 25GX1X1/2 BEV (NEEDLE) IMPLANT
NS IRRIG 1000ML POUR BTL (IV SOLUTION) ×1 IMPLANT
PACK ORTHO EXTREMITY (CUSTOM PROCEDURE TRAY) ×1 IMPLANT
PAD ARMBOARD 7.5X6 YLW CONV (MISCELLANEOUS) ×2 IMPLANT
PAD CAST 4YDX4 CTTN HI CHSV (CAST SUPPLIES) IMPLANT
PADDING CAST COTTON 4X4 STRL (CAST SUPPLIES)
SOAP 2 % CHG 4 OZ (WOUND CARE) ×1 IMPLANT
SPECIMEN JAR SMALL (MISCELLANEOUS) ×1 IMPLANT
SUT MERSILENE 4 0 P 3 (SUTURE) IMPLANT
SUT PROLENE 4 0 PS 2 18 (SUTURE) IMPLANT
SUT VICRYL RAPIDE 4/0 PS 2 (SUTURE) IMPLANT
SWAB COLLECTION DEVICE MRSA (MISCELLANEOUS) IMPLANT
SWAB CULTURE ESWAB REG 1ML (MISCELLANEOUS) IMPLANT
SYR CONTROL 10ML LL (SYRINGE) IMPLANT
TOWEL GREEN STERILE (TOWEL DISPOSABLE) ×1 IMPLANT
TOWEL GREEN STERILE FF (TOWEL DISPOSABLE) ×1 IMPLANT
TUBE CONNECTING 12X1/4 (SUCTIONS) IMPLANT
WATER STERILE IRR 1000ML POUR (IV SOLUTION) ×1 IMPLANT

## 2023-01-14 NOTE — Discharge Instructions (Signed)
Waylan Rocher, M.D. Hand Surgery  POST-OPERATIVE DISCHARGE INSTRUCTIONS   PRESCRIPTIONS: - You may have been given a prescription to be taken as directed for post-operative pain control.  You may also take over the counter ibuprofen/aleve and tylenol for pain. Take this as directed on the packaging. Do not exceed 3000 mg tylenol/acetaminophen in 24 hours.  Ibuprofen 600-800 mg (3-4) tablets by mouth every 6 hours as needed for pain.   OR  Aleve 2 tablets by mouth every 12 hours (twice daily) as needed for pain.   AND/OR  Tylenol 1000 mg (2 tablets) every 8 hours as needed for pain.  - Please use your pain medication carefully, as refills are limited and you may not be provided with one.  As stated above, please use over the counter pain medicine - it will also be helpful with decreasing your swelling.    ANESTHESIA: -After your surgery, post-surgical discomfort or pain is likely. This discomfort can last several days to a few weeks. At certain times of the day your discomfort may be more intense.   Did you receive a nerve block?   - A nerve block can provide pain relief for one hour to two days after your surgery. As long as the nerve block is working, you will experience little or no sensation in the area the surgeon operated on.  - As the nerve block wears off, you will begin to experience pain or discomfort. It is very important that you begin taking your prescribed pain medication before the nerve block fully wears off. Treating your pain at the first sign of the block wearing off will ensure your pain is better controlled and more tolerable when full-sensation returns. Do not wait until the pain is intolerable, as the medicine will be less effective. It is better to treat pain in advance than to try and catch up.   General Anesthesia:  If you did not receive a nerve block during your surgery, you will need to start taking your pain medication shortly after your surgery and  should continue to do so as prescribed by your surgeon.     ICE AND ELEVATION: - You may use ice for the first 48-72 hours, but it is not critical.   - Motion of your fingers is very important to decrease the swelling.  - Elevation, as much as possible for the next 48 hours, is critical for decreasing swelling as well as for pain relief. Elevation means when you are seated or lying down, you hand should be at or above your heart. When walking, the hand needs to be at or above the level of your elbow.  - If the bandage gets too tight, it may need to be loosened. Please contact our office and we will instruct you in how to do this.    SURGICAL BANDAGES:  - Keep your dressing and/or splint clean and dry at all times.  You can remove your dressing 7 days from now and change with a dry dressing or Band-Aids as needed thereafter. - You may place a plastic bag over your bandage to shower, but be careful, do not get your bandages wet.  - After the bandages have been removed, it is OK to get the stitches wet in a shower or with hand washing. Do Not soak or submerge the wound yet. Please do not use lotions or creams on the stitches.      HAND THERAPY:  - You may not need any. If you  do, we will begin this at your follow up visit in the clinic.    ACTIVITY AND WORK: - You are encouraged to move any fingers which are not in the bandage.  - Light use of the fingers is allowed to assist the other hand with daily hygiene and eating, but strong gripping or lifting is often uncomfortable and should be avoided.  - You might miss a variable period of time from work and hopefully this issue has been discussed prior to surgery. You may not do any heavy work with your affected hand for about 2 weeks.    [facility] Second Floor, [address] [place], [place] [address] [phone]

## 2023-01-14 NOTE — Progress Notes (Signed)
in 2 weeks she will call us if there is any difficulties Electronic Signature(s) Signed: 01/12/2023 4:34:08 PM By: Baltazar Najjar MD Entered By: Baltazar Najjar on 01/12/2023 16:19:21 -------------------------------------------------------------------------------- SuperBill Details Patient Name: Date of Service: [name] Draft NDRA R. 01/12/2023 Medical Record Number: [id number] Patient Account Number: [id number] Date of Birth/Sex: Treating RN: [birth year] (57 y.o. 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 Complete traumatic transphalangeal amputation of right ring finger, initial encounter Facility Procedures : CPT4 Code: 64332951 Description: 99213 - WOUND CARE VISIT-LEV 3 EST PT Modifier: Quantity: 1 Physician Procedures : CPT4 Code Description Modifier [id number] 99213 - WC PHYS LEVEL 3 - EST PT ICD-10 Diagnosis Description 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 01/12/2023 Medical Record Number: [id number] Patient Account Number: [id number] Date of Birth/Sex: Treating RN: [birth year] (57 y.o. 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 Complete traumatic transphalangeal amputation of right ring finger, initial encounter Facility Procedures : CPT4 Code: 64332951 Description: 99213 - WOUND CARE VISIT-LEV 3 EST PT Modifier: Quantity: 1 Physician Procedures : CPT4 Code Description Modifier [id number] 99213 - WC PHYS LEVEL 3 - EST PT ICD-10 Diagnosis Description L97.518 Non-pressure chronic ulcer of other part of right foot with other specified severity E11.621 Type 2 diabetes mellitus with foot ulcer Quantity: 1 Electronic Signature(s) Signed: 01/12/2023  4:34:08 PM By: Baltazar Najjar MD Entered By: Baltazar Najjar on 01/12/2023 16:19:51  of Birth/Sex: Treating RN: 1966-03-03 (57 y.o. F) Primary Care Provider: Antony Haste Other  Clinician: Referring Provider: Treating Provider/Extender: Murtis Sink in Treatment: 7 Constitutional Sitting or standing Blood Pressure is within target range for patient.. Pulse regular and within target range for patient.Marland Kitchen Respirations regular, non-labored and within target range.. Temperature is normal and within the target range for the patient.Marland Kitchen Appears in no distress. Notes [name], [name] R ([id number]) [id number] Page 2 of 6 Wound exam; right lateral malleolus the wound looks very satisfactory with healthy granulation is measuring smaller rims of epithelialization. No evidence of infection Electronic Signature(s) Signed: 01/12/2023 4:34:08 PM By: Baltazar Najjar MD Entered By: Baltazar Najjar on 01/12/2023 16:16:56 -------------------------------------------------------------------------------- Physician Orders Details Patient Name: Date of Service: [name] Draft NDRA R. 01/12/2023 3:15 PM Medical Record Number: [id number] Patient Account Number: [id number] Date of Birth/Sex: Treating RN: 06/02/1965 (57 y.o. [name] Primary Care Provider: Antony Haste Other Clinician: Referring Provider: Treating Provider/Extender: Murtis Sink in Treatment: 7 Verbal / Phone Orders: No Diagnosis Coding Follow-up Appointments ppointment in 2 weeks. - Dr. Leanord Hawking - Please make appointment. Return A Anesthetic (In clinic) Topical Lidocaine 4% applied to wound bed Bathing/ Shower/ Hygiene May shower with protection but do not get wound dressing(s) wet. Protect dressing(s) with water repellant cover (for example, large plastic bag) or a cast cover and may then take shower. Edema Control - Lymphedema / SCD / Other Elevate legs to the level of the heart or above for 30 minutes daily and/or when sitting for 3-4 times a day throughout the day. Avoid standing for long periods of time. Patient to wear own  compression stockings every day. Wound Treatment Wound #4 - Ankle Wound Laterality: Right, Lateral Cleanser: Vashe 5.8 (oz) 1 x Per Day/30 Days Discharge Instructions: Cleanse the wound with Vashe prior to applying a clean dressing using gauze sponges, not tissue or cotton balls. Topical: Santyl Collagenase Ointment, 30 (gm), tube 1 x Per Day/30 Days Prim Dressing: Hydrofera Blue Ready Transfer Foam, 4x5 (in/in) 1 x Per Day/30 Days ary Discharge Instructions: Apply to wound bed as instructed Secondary Dressing: Zetuvit Plus 4x8 in (DME) (Generic) 1 x Per Day/30 Days Discharge Instructions: Apply over primary dressing as directed. Laboratory Hemoglobin A (glycated HgB)/Hemoglobin.total in Blood (CHEM) - Patient needs a recent HgA1C. 1c LOINC Code: 4548-4 Convenience Name: Hb A1c -Method not specified Electronic Signature(s) Signed: 01/12/2023 4:34:08 PM By: Baltazar Najjar MD Signed: 01/14/2023 4:20:28 PM By: Brenton Grills Entered By: Brenton Grills on 01/12/2023 15:37:58 [name], [name] ([id number]) [id number] Page 3 of 6 Prescription 01/12/2023 -------------------------------------------------------------------------------- Peixoto, Jennye Moccasin MD Patient Name: Provider: 25-Jun-1965 [id number] Date of Birth: NPI#: [id number] Sex: DEA #: [phone] [id number] Phone #: License #: [id number] UPN: Patient Address: [address] Bridegroom [facility] [place], [place] [address] [address] [place], [place] [address] [phone] Allergies Rocephin Provider's Orders Hemoglobin A (glycated HgB)/Hemoglobin.total in Blood - Patient needs a recent HgA1C. 1c LOINC Code: 4548-4 Convenience Name: Hb A1c -Method not specified Hand Signature: Date(s): Electronic Signature(s) Signed: 01/12/2023 4:34:08 PM By: Baltazar Najjar MD Signed: 01/14/2023 4:20:28 PM By: Brenton Grills Entered By: Brenton Grills  on 01/12/2023 15:37:59 -------------------------------------------------------------------------------- Problem List Details Patient Name: Date of Service: [name] Draft NDRA R. 01/12/2023 3:15 PM Medical Record Number: [id number] Patient Account Number: [id number] Date of Birth/Sex: Treating RN: 1965/09/28 (57 y.o. [name] Primary Care Provider: Antony Haste  in 2 weeks she will call us if there is any difficulties Electronic Signature(s) Signed: 01/12/2023 4:34:08 PM By: Baltazar Najjar MD Entered By: Baltazar Najjar on 01/12/2023 16:19:21 -------------------------------------------------------------------------------- SuperBill Details Patient Name: Date of Service: [name] Draft NDRA R. 01/12/2023 Medical Record Number: [id number] Patient Account Number: [id number] Date of Birth/Sex: Treating RN: [birth year] (57 y.o. 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 Complete traumatic transphalangeal amputation of right ring finger, initial encounter Facility Procedures : CPT4 Code: 64332951 Description: 99213 - WOUND CARE VISIT-LEV 3 EST PT Modifier: Quantity: 1 Physician Procedures : CPT4 Code Description Modifier [id number] 99213 - WC PHYS LEVEL 3 - EST PT ICD-10 Diagnosis Description L97.518 Non-pressure chronic ulcer of other part of right foot with other specified severity E11.621 Type 2 diabetes mellitus with foot ulcer Quantity: 1 Electronic Signature(s) Signed: 01/12/2023  4:34:08 PM By: Baltazar Najjar MD Entered By: Baltazar Najjar on 01/12/2023 16:19:51  of Birth/Sex: Treating RN: 1966-03-03 (57 y.o. F) Primary Care Provider: Antony Haste Other  Clinician: Referring Provider: Treating Provider/Extender: Murtis Sink in Treatment: 7 Constitutional Sitting or standing Blood Pressure is within target range for patient.. Pulse regular and within target range for patient.Marland Kitchen Respirations regular, non-labored and within target range.. Temperature is normal and within the target range for the patient.Marland Kitchen Appears in no distress. Notes [name], [name] R ([id number]) [id number] Page 2 of 6 Wound exam; right lateral malleolus the wound looks very satisfactory with healthy granulation is measuring smaller rims of epithelialization. No evidence of infection Electronic Signature(s) Signed: 01/12/2023 4:34:08 PM By: Baltazar Najjar MD Entered By: Baltazar Najjar on 01/12/2023 16:16:56 -------------------------------------------------------------------------------- Physician Orders Details Patient Name: Date of Service: [name] Draft NDRA R. 01/12/2023 3:15 PM Medical Record Number: [id number] Patient Account Number: [id number] Date of Birth/Sex: Treating RN: 06/02/1965 (57 y.o. [name] Primary Care Provider: Antony Haste Other Clinician: Referring Provider: Treating Provider/Extender: Murtis Sink in Treatment: 7 Verbal / Phone Orders: No Diagnosis Coding Follow-up Appointments ppointment in 2 weeks. - Dr. Leanord Hawking - Please make appointment. Return A Anesthetic (In clinic) Topical Lidocaine 4% applied to wound bed Bathing/ Shower/ Hygiene May shower with protection but do not get wound dressing(s) wet. Protect dressing(s) with water repellant cover (for example, large plastic bag) or a cast cover and may then take shower. Edema Control - Lymphedema / SCD / Other Elevate legs to the level of the heart or above for 30 minutes daily and/or when sitting for 3-4 times a day throughout the day. Avoid standing for long periods of time. Patient to wear own  compression stockings every day. Wound Treatment Wound #4 - Ankle Wound Laterality: Right, Lateral Cleanser: Vashe 5.8 (oz) 1 x Per Day/30 Days Discharge Instructions: Cleanse the wound with Vashe prior to applying a clean dressing using gauze sponges, not tissue or cotton balls. Topical: Santyl Collagenase Ointment, 30 (gm), tube 1 x Per Day/30 Days Prim Dressing: Hydrofera Blue Ready Transfer Foam, 4x5 (in/in) 1 x Per Day/30 Days ary Discharge Instructions: Apply to wound bed as instructed Secondary Dressing: Zetuvit Plus 4x8 in (DME) (Generic) 1 x Per Day/30 Days Discharge Instructions: Apply over primary dressing as directed. Laboratory Hemoglobin A (glycated HgB)/Hemoglobin.total in Blood (CHEM) - Patient needs a recent HgA1C. 1c LOINC Code: 4548-4 Convenience Name: Hb A1c -Method not specified Electronic Signature(s) Signed: 01/12/2023 4:34:08 PM By: Baltazar Najjar MD Signed: 01/14/2023 4:20:28 PM By: Brenton Grills Entered By: Brenton Grills on 01/12/2023 15:37:58 [name], [name] ([id number]) [id number] Page 3 of 6 Prescription 01/12/2023 -------------------------------------------------------------------------------- Peixoto, Jennye Moccasin MD Patient Name: Provider: 25-Jun-1965 [id number] Date of Birth: NPI#: [id number] Sex: DEA #: [phone] Phone #: License #: [id number] UPN: Patient Address: [address] Br[address]room [facility] [place], [place] [address] [address] [place], [place] [address] [phone] Allergies Rocephin Provider's Orders Hemoglobin A (glycated HgB)/Hemoglobin.total in Blood - Patient needs a recent HgA1C. 1c LOINC Code: 4548-4 Convenience Name: Hb A1c -Method not specified Hand Signature: Date(s): Electronic Signature(s) Signed: 01/12/2023 4:34:08 PM By: Baltazar Najjar MD Signed: 01/14/2023 4:20:28 PM By: Brenton Grills Entered By: Brenton Grills  on 01/12/2023 15:37:59 -------------------------------------------------------------------------------- Problem List Details Patient Name: Date of Service: [name] Draft NDRA R. 01/12/2023 3:15 PM Medical Record Number: [id number] Patient Account Number: [id number] Date of Birth/Sex: Treating RN: 1965/09/28 (57 y.o. [name] Primary Care Provider: Antony Haste

## 2023-01-14 NOTE — Interval H&P Note (Signed)
History and Physical Interval Note:  01/14/2023 3:41 PM  [name]  has presented today for surgery, with the diagnosis of Left ring finger distal phalanx osteomyelitis.  The various methods of treatment have been discussed with the patient and family. After consideration of risks, benefits and other options for treatment, the patient has consented to  Procedure(s) with comments: Left ring finger amputation (Left) - local 60 as a surgical intervention.  The patient's history has been reviewed, patient examined, no change in status, stable for surgery.  I have reviewed the patient's chart and labs.  Questions were answered to the patient's satisfaction.     [name] Ritisha Deitrick

## 2023-01-14 NOTE — Progress Notes (Signed)
[name] ([id number]) [id number] Page 1 of 10 Visit Report for 01/12/2023 Arrival Information Details Patient Name: Date of Service: [name]. 01/12/2023 3:15 PM Medical Record Number: [id number] Patient Account Number: [id number] Date of Birth/Sex: Treating RN: 11/15/1965 (57 y.o. [name] Primary Care Detron Carras: Antony Haste Other Clinician: Referring Alivya Wegman: Treating Dean Goldner/Extender: Murtis Sink in Treatment: 7 Visit Information History Since Last Visit All ordered tests and consults were completed: Yes Patient Arrived: Ambulatory Added or deleted any medications: No Arrival Time: 15:13 Any new allergies or adverse reactions: No Accompanied By: son Had a fall or experienced change in No Transfer Assistance: None activities of daily living that may affect Patient Identification Verified: Yes risk of falls: Secondary Verification Process Completed: Yes Signs or symptoms of abuse/neglect since last visito No Patient Requires Transmission-Based Precautions: No Hospitalized since last visit: No Patient Has Alerts: No Implantable device outside of the clinic excluding No cellular tissue based products placed in the center since last visit: Has Dressing in Place as Prescribed: Yes Pain Present Now: No Electronic Signature(s) Signed: 01/14/2023 4:20:28 PM By: Brenton Grills Entered By: Brenton Grills on 01/12/2023 15:16:03 -------------------------------------------------------------------------------- Clinic Level of Care Assessment Details Patient Name: Date of Service: [name]. 01/12/2023 3:15 PM Medical Record Number: [id number] Patient Account Number: [id number] Date of Birth/Sex: Treating RN: 06-28-1965 (57 y.o. [name] Primary Care Nachelle Negrette: Antony Haste Other Clinician: Referring Alberta Lenhard: Treating Jaray Boliver/Extender: Murtis Sink in  Treatment: 7 Clinic Level of Care Assessment Items TOOL 4 Quantity Score X- 1 0 Use when only an EandM is performed on FOLLOW-UP visit ASSESSMENTS - Nursing Assessment / Reassessment X- 1 10 Reassessment of Co-morbidities (includes updates in patient status) X- 1 5 Reassessment of Adherence to Treatment Plan ASSESSMENTS - Wound and Skin A ssessment / Reassessment X - Simple Wound Assessment / Reassessment - one wound 1 5 X- 1 5 Complex Wound Assessment / Reassessment - multiple wounds X- 1 10 Dermatologic / Skin Assessment (not related to wound area) ASSESSMENTS - Focused Assessment []  - 0 Circumferential Edema Measurements - multi extremities []  - 0 Nutritional Assessment / Counseling / Intervention [name], [name] ([id number]) [phone].pdf Page 2 of 10 []  - 0 Lower Extremity Assessment (monofilament, tuning fork, pulses) []  - 0 Peripheral Arterial Disease Assessment (using hand held doppler) ASSESSMENTS - Ostomy and/or Continence Assessment and Care []  - 0 Incontinence Assessment and Management []  - 0 Ostomy Care Assessment and Management (repouching, etc.) PROCESS - Coordination of Care X - Simple Patient / Family Education for ongoing care 1 15 []  - 0 Complex (extensive) Patient / Family Education for ongoing care X- 1 10 Staff obtains Chiropractor, Records, T Results / Process Orders est []  - 0 Staff telephones HHA, Nursing Homes / Clarify orders / etc []  - 0 Routine Transfer to another Facility (non-emergent condition) []  - 0 Routine Hospital Admission (non-emergent condition) []  - 0 New Admissions / Manufacturing engineer / Ordering NPWT Apligraf, etc. , []  - 0 Emergency Hospital Admission (emergent condition) X- 1 10 Simple Discharge Coordination []  - 0 Complex (extensive) Discharge Coordination PROCESS - Special Needs []  - 0 Pediatric / [place] Patient Management []  - 0 Isolation Patient Management []  - 0 Hearing / Language /  Visual special needs []  - 0 Assessment of Community assistance (transportation, D/C planning, etc.) []  - 0 Additional assistance / Altered mentation []  - 0 Support Surface(s) Assessment (bed, cushion, seat, etc.) INTERVENTIONS -  [name] ([id number]) [id number] Page 1 of 10 Visit Report for 01/12/2023 Arrival Information Details Patient Name: Date of Service: [name]. 01/12/2023 3:15 PM Medical Record Number: [id number] Patient Account Number: [id number] Date of Birth/Sex: Treating RN: 11/15/1965 (57 y.o. [name] Primary Care Detron Carras: Antony Haste Other Clinician: Referring Alivya Wegman: Treating Dean Goldner/Extender: Murtis Sink in Treatment: 7 Visit Information History Since Last Visit All ordered tests and consults were completed: Yes Patient Arrived: Ambulatory Added or deleted any medications: No Arrival Time: 15:13 Any new allergies or adverse reactions: No Accompanied By: son Had a fall or experienced change in No Transfer Assistance: None activities of daily living that may affect Patient Identification Verified: Yes risk of falls: Secondary Verification Process Completed: Yes Signs or symptoms of abuse/neglect since last visito No Patient Requires Transmission-Based Precautions: No Hospitalized since last visit: No Patient Has Alerts: No Implantable device outside of the clinic excluding No cellular tissue based products placed in the center since last visit: Has Dressing in Place as Prescribed: Yes Pain Present Now: No Electronic Signature(s) Signed: 01/14/2023 4:20:28 PM By: Brenton Grills Entered By: Brenton Grills on 01/12/2023 15:16:03 -------------------------------------------------------------------------------- Clinic Level of Care Assessment Details Patient Name: Date of Service: [name]. 01/12/2023 3:15 PM Medical Record Number: [id number] Patient Account Number: [id number] Date of Birth/Sex: Treating RN: 06-28-1965 (57 y.o. [name] Primary Care Nachelle Negrette: Antony Haste Other Clinician: Referring Alberta Lenhard: Treating Jaray Boliver/Extender: Murtis Sink in  Treatment: 7 Clinic Level of Care Assessment Items TOOL 4 Quantity Score X- 1 0 Use when only an EandM is performed on FOLLOW-UP visit ASSESSMENTS - Nursing Assessment / Reassessment X- 1 10 Reassessment of Co-morbidities (includes updates in patient status) X- 1 5 Reassessment of Adherence to Treatment Plan ASSESSMENTS - Wound and Skin A ssessment / Reassessment X - Simple Wound Assessment / Reassessment - one wound 1 5 X- 1 5 Complex Wound Assessment / Reassessment - multiple wounds X- 1 10 Dermatologic / Skin Assessment (not related to wound area) ASSESSMENTS - Focused Assessment []  - 0 Circumferential Edema Measurements - multi extremities []  - 0 Nutritional Assessment / Counseling / Intervention [name], [name] ([id number]) [phone].pdf Page 2 of 10 []  - 0 Lower Extremity Assessment (monofilament, tuning fork, pulses) []  - 0 Peripheral Arterial Disease Assessment (using hand held doppler) ASSESSMENTS - Ostomy and/or Continence Assessment and Care []  - 0 Incontinence Assessment and Management []  - 0 Ostomy Care Assessment and Management (repouching, etc.) PROCESS - Coordination of Care X - Simple Patient / Family Education for ongoing care 1 15 []  - 0 Complex (extensive) Patient / Family Education for ongoing care X- 1 10 Staff obtains Chiropractor, Records, T Results / Process Orders est []  - 0 Staff telephones HHA, Nursing Homes / Clarify orders / etc []  - 0 Routine Transfer to another Facility (non-emergent condition) []  - 0 Routine Hospital Admission (non-emergent condition) []  - 0 New Admissions / Manufacturing engineer / Ordering NPWT Apligraf, etc. , []  - 0 Emergency Hospital Admission (emergent condition) X- 1 10 Simple Discharge Coordination []  - 0 Complex (extensive) Discharge Coordination PROCESS - Special Needs []  - 0 Pediatric / [place] Patient Management []  - 0 Isolation Patient Management []  - 0 Hearing / Language /  Visual special needs []  - 0 Assessment of Community assistance (transportation, D/C planning, etc.) []  - 0 Additional assistance / Altered mentation []  - 0 Support Surface(s) Assessment (bed, cushion, seat, etc.) INTERVENTIONS -  [name] ([id number]) [id number] Page 1 of 10 Visit Report for 01/12/2023 Arrival Information Details Patient Name: Date of Service: [name]. 01/12/2023 3:15 PM Medical Record Number: [id number] Patient Account Number: [id number] Date of Birth/Sex: Treating RN: 11/15/1965 (57 y.o. [name] Primary Care Detron Carras: Antony Haste Other Clinician: Referring Alivya Wegman: Treating Dean Goldner/Extender: Murtis Sink in Treatment: 7 Visit Information History Since Last Visit All ordered tests and consults were completed: Yes Patient Arrived: Ambulatory Added or deleted any medications: No Arrival Time: 15:13 Any new allergies or adverse reactions: No Accompanied By: son Had a fall or experienced change in No Transfer Assistance: None activities of daily living that may affect Patient Identification Verified: Yes risk of falls: Secondary Verification Process Completed: Yes Signs or symptoms of abuse/neglect since last visito No Patient Requires Transmission-Based Precautions: No Hospitalized since last visit: No Patient Has Alerts: No Implantable device outside of the clinic excluding No cellular tissue based products placed in the center since last visit: Has Dressing in Place as Prescribed: Yes Pain Present Now: No Electronic Signature(s) Signed: 01/14/2023 4:20:28 PM By: Brenton Grills Entered By: Brenton Grills on 01/12/2023 15:16:03 -------------------------------------------------------------------------------- Clinic Level of Care Assessment Details Patient Name: Date of Service: [name]. 01/12/2023 3:15 PM Medical Record Number: [id number] Patient Account Number: [id number] Date of Birth/Sex: Treating RN: 06-28-1965 (57 y.o. [name] Primary Care Nachelle Negrette: Antony Haste Other Clinician: Referring Alberta Lenhard: Treating Jaray Boliver/Extender: Murtis Sink in  Treatment: 7 Clinic Level of Care Assessment Items TOOL 4 Quantity Score X- 1 0 Use when only an EandM is performed on FOLLOW-UP visit ASSESSMENTS - Nursing Assessment / Reassessment X- 1 10 Reassessment of Co-morbidities (includes updates in patient status) X- 1 5 Reassessment of Adherence to Treatment Plan ASSESSMENTS - Wound and Skin A ssessment / Reassessment X - Simple Wound Assessment / Reassessment - one wound 1 5 X- 1 5 Complex Wound Assessment / Reassessment - multiple wounds X- 1 10 Dermatologic / Skin Assessment (not related to wound area) ASSESSMENTS - Focused Assessment []  - 0 Circumferential Edema Measurements - multi extremities []  - 0 Nutritional Assessment / Counseling / Intervention [name], [name] ([id number]) [phone].pdf Page 2 of 10 []  - 0 Lower Extremity Assessment (monofilament, tuning fork, pulses) []  - 0 Peripheral Arterial Disease Assessment (using hand held doppler) ASSESSMENTS - Ostomy and/or Continence Assessment and Care []  - 0 Incontinence Assessment and Management []  - 0 Ostomy Care Assessment and Management (repouching, etc.) PROCESS - Coordination of Care X - Simple Patient / Family Education for ongoing care 1 15 []  - 0 Complex (extensive) Patient / Family Education for ongoing care X- 1 10 Staff obtains Chiropractor, Records, T Results / Process Orders est []  - 0 Staff telephones HHA, Nursing Homes / Clarify orders / etc []  - 0 Routine Transfer to another Facility (non-emergent condition) []  - 0 Routine Hospital Admission (non-emergent condition) []  - 0 New Admissions / Manufacturing engineer / Ordering NPWT Apligraf, etc. , []  - 0 Emergency Hospital Admission (emergent condition) X- 1 10 Simple Discharge Coordination []  - 0 Complex (extensive) Discharge Coordination PROCESS - Special Needs []  - 0 Pediatric / [place] Patient Management []  - 0 Isolation Patient Management []  - 0 Hearing / Language /  Visual special needs []  - 0 Assessment of Community assistance (transportation, D/C planning, etc.) []  - 0 Additional assistance / Altered mentation []  - 0 Support Surface(s) Assessment (bed, cushion, seat, etc.) INTERVENTIONS -  Wound Cleansing / Measurement X - Simple Wound Cleansing - one wound 1 5 []  - 0 Complex Wound Cleansing - multiple wounds X- 1 5 Wound Imaging (photographs - any number of wounds) []  - 0 Wound Tracing (instead of photographs) X- 1 5 Simple Wound Measurement - one wound []  - 0 Complex Wound Measurement - multiple wounds INTERVENTIONS - Wound Dressings X - Small Wound Dressing one or multiple wounds 1 10 []  - 0 Medium Wound Dressing one or multiple wounds []  - 0 Large Wound Dressing one or multiple wounds []  - 0 Application of Medications - topical []  - 0 Application of Medications - injection INTERVENTIONS - Miscellaneous []  - 0 External ear exam []  - 0 Specimen Collection (cultures, biopsies, blood, body fluids, etc.) []  - 0 Specimen(s) / Culture(s) sent or taken to Lab for analysis []  - 0 Patient Transfer (multiple staff / Nurse, adult / Similar devices) []  - 0 Simple Staple / Suture removal (25 or less) []  - 0 Complex Staple / Suture removal (26 or more) []  - 0 Hypo / Hyperglycemic Management (close monitor of Blood Glucose) [name], [name] ([id number]) [id number] Page 3 of 10 []  - 0 Ankle / Brachial Index (ABI) - do not check if billed separately X- 1 5 Vital Signs Has the patient been seen at the hospital within the last three years: Yes Total Score: 100 Level Of Care: New/Established - Level 3 Electronic Signature(s) Signed: 01/14/2023 4:20:28 PM By: Brenton Grills Entered By: Brenton Grills on 01/12/2023 15:38:50 -------------------------------------------------------------------------------- Encounter Discharge Information Details Patient Name: Date of Service: [name]. 01/12/2023 3:15  PM Medical Record Number: [id number] Patient Account Number: [id number] Date of Birth/Sex: Treating RN: 1965/06/17 (57 y.o. [name] Primary Care Luz Mares: Antony Haste Other Clinician: Referring Vertis Scheib: Treating Tekeshia Klahr/Extender: Murtis Sink in Treatment: 7 Encounter Discharge Information Items Discharge Condition: Stable Ambulatory Status: Ambulatory Discharge Destination: Home Transportation: Private Auto Accompanied By: son Schedule Follow-up Appointment: Yes Clinical Summary of Care: Patient Declined Electronic Signature(s) Signed: 01/14/2023 4:20:28 PM By: Brenton Grills Entered By: Brenton Grills on 01/12/2023 15:47:57 -------------------------------------------------------------------------------- Lower Extremity Assessment Details Patient Name: Date of Service: [name]. 01/12/2023 3:15 PM Medical Record Number: [id number] Patient Account Number: [id number] Date of Birth/Sex: Treating RN: [birth year] (57 y.o. [name] Primary Care Shaylea Ucci: Antony Haste Other Clinician: Referring Adron Geisel: Treating Jasha Hodzic/Extender: Murtis Sink in Treatment: 7 Edema Assessment Assessed: [name]: No] [name]: No] Edema: [Left: Ye] [Right: s] Calf Left: Right: Point of Measurement: 34.5 cm From Medial Instep 41 cm Ankle Left: Right: Point of Measurement: 12.5 cm From Medial Instep 26 cm Vascular Assessment Left: [[id number] Page 4 of 10Right:] Pulses: Dorsalis Pedis Palpable: [phone].pdf Page 4 of 10Yes] Extremity colors, hair growth, and conditions: Extremity Color: [phone].pdf Page 4 of 10Normal] Hair Growth on Extremity: [phone].pdf Page 4 of 10No] Temperature of Extremity: [phone].pdf Page 4 of 10Warm] Capillary Refill: [phone].pdf  Page 4 of 10< 3 seconds] Dependent Rubor: [phone].pdf Page 4 of 10No No] Toe Nail Assessment Left: Right: Thick: Yes Discolored: Yes Deformed: Yes Improper Length and Hygiene: Yes Electronic Signature(s) Signed: 01/14/2023 4:20:28 PM By: Brenton Grills Entered By: Brenton Grills on 01/12/2023 15:20:28 -------------------------------------------------------------------------------- Multi Wound Chart Details Patient Name: Date of Service: [name]. 01/12/2023 3:15 PM Medical Record Number: [id number] Patient Account Number: [id number] Date of Birth/Sex: Treating RN: 1966-04-13 (57 y.o. F) Primary Care Finesse Fielder: Antony Haste Other Clinician: Referring Lazlo Tunney:  Treating Donicia Druck/Extender: Murtis Sink in Treatment: 7 Vital Signs Height(in): 64 Capillary Blood Glucose(mg/dl): 782 Weight(lbs): 956 Pulse(bpm): 97 Body Mass Index(BMI): 44.8 Blood Pressure(mmHg): 122/78 Temperature(F): 98.5 Respiratory Rate(breaths/min): 18 [4:Photos:] [N/A:N/A] Right, Lateral Ankle N/A N/A Wound Location: Trauma N/A N/A Wounding Event: Diabetic Wound/Ulcer of the Lower N/A N/A Primary Etiology: Extremity Anemia, Sleep Apnea, Type II N/A N/A Comorbid History: Diabetes, Gout, Osteoarthritis, Osteomyelitis, Neuropathy 07/27/2022 N/A N/A Date Acquired: 7 N/A N/A Weeks of Treatment: Open N/A N/A Wound Status: No N/A N/A Wound Recurrence: Yes N/A N/A Pending A mputation on Presentation: 1.2x1.2x0.1 N/A N/A Measurements L x W x D (cm) 1.131 N/A N/A A (cm) : rea 0.113 N/A N/A Volume (cm) : 60.90% N/A N/A % Reduction in Area: 80.40% N/A N/A % Reduction in Volume: [name], [name] ([id number]) [phone].pdf Page 5 of 10 Grade 1 N/A N/A Classification: Medium N/A N/A Exudate A mount: Serosanguineous N/A N/A Exudate Type: red, brown N/A N/A Exudate Color: Distinct, outline attached N/A  N/A Wound Margin: Large (67-100%) N/A N/A Granulation A mount: Pink N/A N/A Granulation Quality: Small (1-33%) N/A N/A Necrotic A mount: Fat Layer (Subcutaneous Tissue): Yes N/A N/A Exposed Structures: Fascia: No Tendon: No Muscle: No Joint: No Bone: No Small (1-33%) N/A N/A Epithelialization: Excoriation: No N/A N/A Periwound Skin Texture: Induration: No Callus: No Crepitus: No Rash: No Scarring: No Maceration: No N/A N/A Periwound Skin Moisture: Dry/Scaly: No Atrophie Blanche: No N/A N/A Periwound Skin Color: Cyanosis: No Ecchymosis: No Erythema: No Hemosiderin Staining: No Mottled: No Pallor: No Rubor: No No Abnormality N/A N/A Temperature: Treatment Notes Wound #4 (Ankle) Wound Laterality: Right, Lateral Cleanser Vashe 5.8 (oz) Discharge Instruction: Cleanse the wound with Vashe prior to applying a clean dressing using gauze sponges, not tissue or cotton balls. Peri-Wound Care Topical Santyl Collagenase Ointment, 30 (gm), tube Primary Dressing Hydrofera Blue Ready Transfer Foam, 4x5 (in/in) Discharge Instruction: Apply to wound bed as instructed Secondary Dressing Zetuvit Plus 4x8 in Discharge Instruction: Apply over primary dressing as directed. Secured With Compression Wrap Compression Stockings Facilities manager) Signed: 01/12/2023 4:34:08 PM By: Baltazar Najjar MD Entered By: Baltazar Najjar on 01/12/2023 16:14:28 -------------------------------------------------------------------------------- Multi-Disciplinary Care Plan Details Patient Name: Date of Service: [name]. 01/12/2023 3:15 PM Medical Record Number: [id number] Patient Account Number: [id number] Date of Birth/Sex: Treating RN: 1966/01/14 (57 y.o. [name] Primary Care Asmar Brozek: Antony Haste Other Clinician: IYLEE, [name] ([id number]) [id number] Page 6 of 10 Referring Jadis Mika: Treating Square Jowett/Extender: Murtis Sink in Treatment: 7 Active Inactive Necrotic Tissue Nursing Diagnoses: Knowledge deficit related to management of necrotic/devitalized tissue Goals: Necrotic/devitalized tissue will be minimized in the wound bed Date Initiated: 11/23/2022 Target Resolution Date: 01/22/2023 Goal Status: Active Patient/caregiver will verbalize understanding of reason and process for debridement of necrotic tissue Date Initiated: 11/23/2022 Target Resolution Date: 01/22/2023 Goal Status: Active Interventions: Assess patient pain level pre-, during and post procedure and prior to discharge Provide education on necrotic tissue and debridement process Treatment Activities: Biologic debridement : 11/23/2022 Excisional debridement : 11/23/2022 Notes: Nutrition Nursing Diagnoses: Potential for alteratiion in Nutrition/Potential for imbalanced nutrition Goals: Patient/caregiver agrees to and verbalizes understanding of need to obtain nutritional consultation Date Initiated: 11/23/2022 Date Inactivated: 12/22/2022 Target Resolution Date: 12/25/2022 Goal Status: Met Patient/caregiver will maintain therapeutic glucose control Date Initiated: 11/23/2022 Target Resolution Date: 01/22/2023 Goal Status: Active Interventions: Assess HgA1c results as ordered upon admission and as needed Provide education on elevated blood sugars and

## 2023-01-14 NOTE — Op Note (Signed)
   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  Patient has a history of nonhealing wounds with subsequent infection involving nearly all of her fingers.  She presents today for amputation of the ring finger through the distal phalanx.  Risks, benefits, and alternatives to surgery were again discussed with the patient in the preoperative area. The patient wishes to proceed with surgery.  Informed consent was signed after our discussion.   PREOPERATIVE DIAGNOSIS:  Left ring finger distal phalanx osteomyelitis  POSTOPERATIVE DIAGNOSIS: Same.  PROCEDURE:  Amputation of left ring finger through distal phalanx   SURGEON: Waylan Rocher, M.D.  ASSIST: None  ANESTHESIA:  Local + MAC  IV FLUIDS AND URINE: See anesthesia.  ESTIMATED BLOOD LOSS: 5 mL.  IMPLANTS: * No implants in log *   DRAINS: None  COMPLICATIONS: None  DESCRIPTION OF PROCEDURE: The patient was met in the preoperative holding area where the surgical site was marked and the informed consent form was signed.  She was brought to the operating room and transferred to the OR table. A formal timeout was performed to confirm that this was the correct patient, surgical side, surgical site, and surgical procedure.  All were present and in agreement. Following formal timeout, a local block was performed using 10 mL of 0.25% plain marcaine.  The left upper extremity was then prepped and draped in the usual and sterile fashion.   Following formal timeout, Penrose drain was placed around the base of the ring finger, pulled taut, and clamped to serve as a finger tourniquet.  A fishmouth incision was designed at the tip of the ring finger proximal to the eponychium.  The skin was incised.  The full-thickness cut flaps were developed.  The distal phalanx was transected at its proximal aspect.  The tip of  the finger was passed off the back table for pathology.  Culture swabs were taken.  There is no gross purulence.  There was a portion of the germinal matrix of the nail apparatus that was sharply excised with a 15 blade scalpel.  The wound was then thoroughly irrigated copious sterile saline.  1 dissection was used to identify the radial ulnar neurovascular bundles.  Traction neurectomies were performed.  Following thorough irrigation, the wound was closed using a 4-0 Vicryl Rapide suture in simple fashion.  The tourniquet was removed.  Hemostasis was achieved with direct pressure of the wound.  The wound was dressed with Xeroform, bacitracin ointment, 4 x 4's, Kling wrap, and 1 inch Coban.   The patient was reversed from sedation. They were transferred from the operating table to the postoperative bed.  All counts were correct x 2 at the end of the procedure.  The patient was then taken to the PACU in stable condition.   POSTOPERATIVE PLAN: She will be discharged to home on oral antibiotics.  I will see her back in the office in 10 days or so for wound check.  She is seeing infectious disease next week to talk about potential chronic antibiotic suppression given her history of recurrent infections.   Waylan Rocher, MD 4:50 PM

## 2023-01-14 NOTE — H&P (Signed)
HAND SURGERY   HPI: Patient is a 57 y.o. female who presents with an infection of the left ring finger with radiographic evidence of distal phalanx osteomyelitis.  Patient denies any changes to their medical history or new systemic symptoms today.    Past Medical History:  Diagnosis Date   Acute bacterial bronchitis 05/16/2022   in [place]   Anemia    hx   Arthritis    Charcot's joint of right foot    Hx - DM 2   Depression 02/21/2018   patient denies this dx - pt states that she uses Cymbalta for pain mgmt along with Voltaren   Diabetes mellitus without complication (HCC)    type 2   DOE (dyspnea on exertion) 07/12/2017   FUO (fever of unknown origin) 07/12/2017   Gastroenteritis 04/2016   GERD (gastroesophageal reflux disease)    Headache(784.0)    hormonal headache - no problems since menopause   HLD (hyperlipidemia)    crestor   Low back pain 07/12/2017   Neuromuscular disorder (HCC)    neuropathy - right foot and hands   Obese    Pleuritic chest pain 07/12/2017   Pneumonia    hx 20+ years ago   RSV infection 03/2022   resolved   Sleep apnea    does not use CPAP   Ulcer of foot (HCC)    toe ulcer greater than a year ago   Past Surgical History:  Procedure Laterality Date   AMPUTATION Right 06/11/2017   Procedure: RIGHT LONG FINGER AMPUTATION DIGIT;  Surgeon: Mack Hook, MD;  Location: [facility] OR;  Service: Orthopedics;  Laterality: Right;   AMPUTATION Right 02/22/2018   Procedure: AMPUTATION RIGHT INDEX FINGER;  Surgeon: Knute Neu, MD;  Location: [facility] ORS;  Service: Plastics;  Laterality: Right;   AMPUTATION Left 07/02/2022   Procedure: AMPUTATION OF LEFT MIDDLE FINGER;  Surgeon: Marlyne Beards, MD;  Location: [facility] OR;  Service: Orthopedics;  Laterality: Left;   AMPUTATION Right 11/05/2022   Procedure: Right ring finger amputation;  Surgeon: Marlyne Beards, MD;  Location: [facility] OR;  Service: Orthopedics;  Laterality: Right;  local 60   ANKLE FUSION Left  08/03/2014   Procedure: Left Tibiocalcaneal Fusion;  Surgeon: Nadara Mustard, MD;  Location: [facility] OR;  Service: Orthopedics;  Laterality: Left;   APPLICATION OF WOUND VAC Left 08/03/2014   Procedure: APPLICATION OF WOUND VAC;  Surgeon: Nadara Mustard, MD;  Location: [facility] OR;  Service: Orthopedics;  Laterality: Left;   BELOW KNEE LEG AMPUTATION Left 10/08/2017   in [place]   I & D EXTREMITY Left 08/01/2020   Procedure: IRRIGATION AND DEBRIDEMENT EXTREMITY,PARTIAL AMPUTATION LEFT INDEX FINGER;  Surgeon: Ernest Mallick, MD;  Location: [facility];  Service: Orthopedics;  Laterality: Left;   I & D EXTREMITY Left 08/06/2020   Procedure: IRRIGATION AND DEBRIDEMENT EXTREMITY;  Surgeon: Ernest Mallick, MD;  Location: [facility];  Service: Orthopedics;  Laterality: Left;   I & D KNEE WITH POLY EXCHANGE Right 01/31/2013   Procedure: IRRIGATION AND DEBRIDEMENT KNEE WITH POLY EXCHANGE possible Antibiotic Spacer;  Surgeon: Valeria Batman, MD;  Location: [facility] OR;  Service: Orthopedics;  Laterality: Right;   INCISION AND DRAINAGE Right 01/31/2013   ANTIBIOTIC SPACER  RIGHT KNEE  DR [facility]    INCISION AND DRAINAGE ABSCESS Left 08/03/2020   Procedure: INCISION AND DEBRIDEMENT LEFT INDEX FINGER;  Surgeon: Ernest Mallick, MD;  Location: [facility];  Service: Orthopedics;  Laterality: Left;   KNEE  ARTHROSCOPY Left    MRI  04/07/2019   in [place]   ORIF ANKLE FRACTURE Left 03/16/2014   Procedure: Foot Excision Charcot Collapse,  Internal Fixation;  Surgeon: Nadara Mustard, MD;  Location: [facility] OR;  Service: Orthopedics;  Laterality: Left;   REPLACEMENT TOTAL KNEE Left    TOTAL KNEE ARTHROPLASTY  06/23/2011   Procedure: RIGHT TOTAL KNEE ARTHROPLASTY;  Surgeon: Valeria Batman, MD;  Location: [facility] OR;  Service: Orthopedics;  Laterality: Right;  RIGHT TOTAL KNEE REPLACEMENT   Social History   Socioeconomic History   Marital status: Married    Spouse name: Not on file   Number of children: Not on file   Years of  education: Not on file   Highest education level: Not on file  Occupational History   Not on file  Tobacco Use   Smoking status: Never   Smokeless tobacco: Never  Vaping Use   Vaping status: Never Used  Substance and Sexual Activity   Alcohol use: Yes    Comment: occ a mixed drink/socially   Drug use: No   Sexual activity: Yes    Birth control/protection: Post-menopausal  Other Topics Concern   Not on file  Social History Narrative   Not on file   Social Determinants of Health   Financial Resource Strain: Low Risk  (08/02/2022)   Received from [facility], [facility]   Overall Financial Resource Strain (CARDIA)    Difficulty of Paying Living Expenses: Not very hard  Food Insecurity: No Food Insecurity (08/02/2022)   Received from [facility], [facility]   Hunger Vital Sign    Worried About Running Out of Food in the Last Year: Never true    Ran Out of Food in the Last Year: Never true  Transportation Needs: No Transportation Needs (08/02/2022)   Received from [facility], [facility]   PRAPARE - Transportation    Lack of Transportation (Medical): No    Lack of Transportation (Non-Medical): No  Physical Activity: Insufficiently Active (08/02/2022)   Received from [facility], [facility]   Exercise Vital Sign    Days of Exercise per Week: 3 days    Minutes of Exercise per Session: 20 min  Stress: No Stress Concern Present (08/02/2022)   Received from [facility], [facility] - Occupational Stress Questionnaire    Feeling of Stress : Not at all  Social Connections: Moderately Integrated (08/02/2022)   Received from [facility], [facility]   Social Network    How would you rate your social network (family, work, friends)?: Adequate participation with social networks   Family History  Problem Relation Age of Onset   Anesthesia problems Father    Stroke Father    Hypotension Neg Hx    Malignant hyperthermia  Neg Hx    Pseudochol deficiency Neg Hx    - negative except otherwise stated in the family history section Allergies  Allergen Reactions   Rocephin [Ceftriaxone] Shortness Of Breath and Swelling    Tolerated course of Rocephin 6/19 and multiple courses of Ancef/Keflex as well as Cefepime   Prior to Admission medications   Medication Sig Start Date End Date Taking? Authorizing Provider  dapagliflozin propanediol (FARXIGA) 10 MG TABS tablet Take 10 mg by mouth in the morning.   Yes [provider]  diclofenac (VOLTAREN) 75 MG EC tablet Take 75 mg by mouth 2 (two) times daily.  02/28/14  Yes [provider]  Dulaglutide (TRULICITY)  1.5 MG/0.5ML SOPN Inject 1.5 mg into the skin once a week. On Mondays   Yes [provider]  DULoxetine (CYMBALTA) 60 MG capsule Take 60 mg by mouth in the morning.   Yes [provider]  gabapentin (NEURONTIN) 600 MG tablet Take 600 mg by mouth See admin instructions. Take 1 tablet (600 mg) by mouth scheduled twice daily (morning & night), may take an additional dose at noon if needed for pain. 04/23/19  Yes [provider]  glipiZIDE (GLUCOTROL XL) 10 MG 24 hr tablet Take 10 mg by mouth daily with breakfast.  05/23/17  Yes [provider]  GLUCOSAMINE CHONDROITIN MSM PO Take 1 tablet by mouth in the morning.   Yes [provider]  HYDROcodone-acetaminophen (NORCO) 10-325 MG tablet Take 1 tablet by mouth 2 (two) times daily as needed (pain.).   Yes [provider]  Insulin Glargine (BASAGLAR KWIKPEN) 100 UNIT/ML Inject 50 Units into the skin daily. May substitute as needed per insurance. Patient taking differently: Inject 60 Units into the skin in the morning. May substitute as needed per insurance. 07/06/22 11/04/23 Yes Dahal, Melina Schools, MD  insulin lispro (HUMALOG) 100 UNIT/ML injection Inject 0-0.09 mLs (0-9 Units total) into the skin 3 (three) times daily with meals. CBG < 70: Implement Hypoglycemia  Standing Orders and refer to Hypoglycemia Standing Orders sidebar report CBG 70 - 120: 0 units, CBG 121 - 150: 1 unit, CBG 151 - 200: 2 units, CBG 201 - 250: 3 units, CBG 251 - 300: 5 units, CBG 301 - 350: 7 units, CBG 351 - 400: 9 units Patient taking differently: Inject 20 Units into the skin 3 (three) times daily with meals. CBG < 70: Implement Hypoglycemia Standing Orders and refer to Hypoglycemia Standing Orders sidebar report CBG 70 - 120: 0 units, CBG 121 - 150: 1 unit, CBG 151 - 200: 2 units, CBG 201 - 250: 3 units, CBG 251 - 300: 5 units, CBG 301 - 350: 7 units, CBG 351 - 400: 9 units 07/06/22 11/03/24 Yes Dahal, Melina Schools, MD  metFORMIN (GLUCOPHAGE) 1000 MG tablet Take 1,000 mg by mouth in the morning.   Yes [provider]  Multiple Vitamin (MULTIVITAMIN WITH MINERALS) TABS Take 1 tablet by mouth in the morning.   Yes [provider]  oxybutynin (DITROPAN-XL) 10 MG 24 hr tablet Take 10 mg by mouth in the morning.   Yes [provider]  pantoprazole (PROTONIX) 40 MG tablet Take 40 mg by mouth daily before breakfast. 05/13/22  Yes [provider]  rosuvastatin (CRESTOR) 20 MG tablet Take 20 mg by mouth at bedtime.   Yes [provider]  Blood Glucose Monitoring Suppl DEVI 1 each by Does not apply route 3 (three) times daily. May dispense any manufacturer covered by patient's insurance. 07/06/22   Lorin Glass, MD  glucosamine-chondroitin 500-400 MG tablet Take 2 tablets by mouth daily.     [provider]  Glucose Blood (BLOOD GLUCOSE TEST STRIPS) STRP 1 each by Does not apply route 3 (three) times daily. Use as directed to check blood sugar. May dispense any manufacturer covered by patient's insurance and fits patient's device. 07/06/22   Dahal, Melina Schools, MD  Insulin Pen Needle (PEN NEEDLES) 31G X 5 MM MISC 1 each by Does not apply route 3 (three) times daily. May dispense any manufacturer covered by patient's insurance. 07/06/22   Lorin Glass, MD   Lancet Device MISC 1 each by Does not apply route 3 (three) times daily.  May dispense any manufacturer covered by patient's insurance. 07/06/22   Lorin Glass, MD  Lancets MISC 1 each by Does not apply route 3 (three) times daily. Use as directed to check blood sugar. May dispense any manufacturer covered by patient's insurance and fits patient's device. 07/06/22   Lorin Glass, MD   No results found. - Positive ROS: All other systems have been reviewed and were otherwise negative with the exception of those mentioned in the HPI and as above.  Physical Exam: General: No acute distress, resting comfortably Cardiovascular: BUE warm and well perfused, normal rate Respiratory: Normal WOB on RA Skin: Warm and dry Neurologic: Sensation intact distally Psychiatric: Patient is at baseline mood and affect  Left Upper Extremity  Previous amputations of all fingers at various levels.  Purulent drainage from wound at tip of ring finger.  Non TTP along flexor tendon sheath or dorsally proximal to DIP joint. Limited sensation in all fingers at baseline.  Fingers warm and well perfused w/ BCR.     Assessment: 57 yo F w/ poorly controlled DM s/p multiple finger amputations presents with infection of left ring finger with radiographic osteomyelitis of the distal phalanx.   Plan: OR today for amputation of left ring finger tip. We again reviewed the risks of surgery which include bleeding, infection, damage to neurovascular structures, persistent symptoms, persistent infection, delayed wound healing, need for additional surgery.  Informed consent was signed.  All questions were answered.   Marlyne Beards, M.D. [facility] 3:38 PM

## 2023-01-14 NOTE — Transfer of Care (Signed)
Immediate Anesthesia Transfer of Care Note  Patient: [name]  Procedure(s) Performed: Left ring finger amputation (Left: Finger)  Patient Location: PACU  Anesthesia Type:General  Level of Consciousness: awake, alert , oriented, patient cooperative, and responds to stimulation  Airway & Oxygen Therapy: Patient Spontanous Breathing and Patient connected to face mask oxygen  Post-op Assessment: Report given to RN and Post -op Vital signs reviewed and stable  Post vital signs: Reviewed and stable  Last Vitals:  Vitals Value Taken Time  BP 144/92 01/14/23 1655  Temp 98.35F 01/14/23 1657    Pulse 80 01/14/23 1657  Resp 12 01/14/23 1657  SpO2 94 % 01/14/23 1657  Vitals shown include unfiled device data.   Complications: No notable events documented.

## 2023-01-15 ENCOUNTER — Encounter (HOSPITAL_COMMUNITY): Payer: Self-pay | Admitting: Orthopedic Surgery

## 2023-01-15 NOTE — Anesthesia Postprocedure Evaluation (Signed)
Anesthesia Post Note  Patient: [name]  Procedure(s) Performed: Left ring finger amputation (Left: Finger)     Patient location during evaluation: PACU Anesthesia Type: MAC Level of consciousness: awake and alert Pain management: pain level controlled Vital Signs Assessment: post-procedure vital signs reviewed and stable Respiratory status: spontaneous breathing, nonlabored ventilation, respiratory function stable and patient connected to nasal cannula oxygen Cardiovascular status: stable and blood pressure returned to baseline Postop Assessment: no apparent nausea or vomiting Anesthetic complications: no  No notable events documented.  Last Vitals:  Vitals:   01/14/23 1700 01/14/23 1715  BP: 127/69 129/74  Pulse: 73 71  Resp: 14 12  Temp:    SpO2: 94% 97%    Last Pain:  Vitals:   01/14/23 1715  PainSc: 0-No pain                 [name]

## 2023-01-18 LAB — SURGICAL PATHOLOGY

## 2023-01-19 LAB — AEROBIC/ANAEROBIC CULTURE W GRAM STAIN (SURGICAL/DEEP WOUND): Gram Stain: NONE SEEN

## 2023-01-25 ENCOUNTER — Ambulatory Visit (HOSPITAL_BASED_OUTPATIENT_CLINIC_OR_DEPARTMENT_OTHER): Payer: Commercial Managed Care - HMO | Admitting: Anesthesiology

## 2023-01-25 ENCOUNTER — Encounter (HOSPITAL_COMMUNITY)
Admission: AD | Disposition: A | Discharge status: Home / Self Care | Payer: Self-pay | Source: Ambulatory Visit | Attending: Orthopedic Surgery

## 2023-01-25 ENCOUNTER — Encounter (HOSPITAL_COMMUNITY): Payer: Self-pay | Admitting: Orthopedic Surgery

## 2023-01-25 ENCOUNTER — Other Ambulatory Visit: Payer: Self-pay

## 2023-01-25 ENCOUNTER — Ambulatory Visit (HOSPITAL_COMMUNITY)
Admission: AD | Admit: 2023-01-25 | Discharge: 2023-01-25 | Disposition: A | Discharge status: Home / Self Care | Payer: Commercial Managed Care - HMO | Source: Ambulatory Visit | Attending: Orthopedic Surgery | Admitting: Orthopedic Surgery

## 2023-01-25 ENCOUNTER — Ambulatory Visit (HOSPITAL_COMMUNITY): Payer: Commercial Managed Care - HMO | Admitting: Anesthesiology

## 2023-01-25 DIAGNOSIS — Z7985 Long-term (current) use of injectable non-insulin antidiabetic drugs: Secondary | ICD-10-CM | POA: Diagnosis not present

## 2023-01-25 DIAGNOSIS — M86142 Other acute osteomyelitis, left hand: Secondary | ICD-10-CM | POA: Diagnosis not present

## 2023-01-25 DIAGNOSIS — E1151 Type 2 diabetes mellitus with diabetic peripheral angiopathy without gangrene: Secondary | ICD-10-CM | POA: Diagnosis not present

## 2023-01-25 DIAGNOSIS — Z794 Long term (current) use of insulin: Secondary | ICD-10-CM | POA: Insufficient documentation

## 2023-01-25 DIAGNOSIS — E1165 Type 2 diabetes mellitus with hyperglycemia: Secondary | ICD-10-CM | POA: Diagnosis present

## 2023-01-25 DIAGNOSIS — Z7984 Long term (current) use of oral hypoglycemic drugs: Secondary | ICD-10-CM | POA: Insufficient documentation

## 2023-01-25 DIAGNOSIS — G4730 Sleep apnea, unspecified: Secondary | ICD-10-CM | POA: Insufficient documentation

## 2023-01-25 HISTORY — PX: AMPUTATION: SHX166

## 2023-01-25 LAB — GLUCOSE, CAPILLARY
Glucose-Capillary: 90 mg/dL (ref 70–99)
Glucose-Capillary: 97 mg/dL (ref 70–99)

## 2023-01-25 SURGERY — AMPUTATION DIGIT
Anesthesia: Monitor Anesthesia Care | Laterality: Left

## 2023-01-25 MED ORDER — ONDANSETRON HCL 4 MG/2ML IJ SOLN
INTRAMUSCULAR | Status: DC | PRN
  Administered 2023-01-25: 4 mg via INTRAVENOUS

## 2023-01-25 MED ORDER — FENTANYL CITRATE (PF) 250 MCG/5ML IJ SOLN
INTRAMUSCULAR | Status: AC
  Filled 2023-01-25: qty 5

## 2023-01-25 MED ORDER — DEXMEDETOMIDINE HCL IN NACL 80 MCG/20ML IV SOLN
INTRAVENOUS | Status: DC | PRN
  Administered 2023-01-25: 8 ug via INTRAVENOUS

## 2023-01-25 MED ORDER — ORAL CARE MOUTH RINSE: 15.0000 mL | Freq: Once | OROMUCOSAL | Status: AC

## 2023-01-25 MED ORDER — LACTATED RINGERS IV SOLN: INTRAVENOUS | Status: DC

## 2023-01-25 MED ORDER — INSULIN ASPART 100 UNIT/ML IJ SOLN: 0.0000 [IU] | INTRAMUSCULAR | Status: DC | PRN

## 2023-01-25 MED ORDER — DOXYCYCLINE HYCLATE 50 MG PO CAPS: 50.0000 mg | ORAL_CAPSULE | Freq: Two times a day (BID) | ORAL | 0 refills | Status: AC

## 2023-01-25 MED ORDER — MIDAZOLAM HCL 2 MG/2ML IJ SOLN
INTRAMUSCULAR | Status: AC
  Filled 2023-01-25: qty 2

## 2023-01-25 MED ORDER — BACITRACIN ZINC 500 UNIT/GM EX OINT
TOPICAL_OINTMENT | CUTANEOUS | Status: AC
  Filled 2023-01-25: qty 28.35

## 2023-01-25 MED ORDER — PROPOFOL 500 MG/50ML IV EMUL
INTRAVENOUS | Status: DC | PRN
  Administered 2023-01-25: 50 ug/kg/min via INTRAVENOUS

## 2023-01-25 MED ORDER — PROPOFOL 10 MG/ML IV BOLUS
INTRAVENOUS | Status: DC | PRN
  Administered 2023-01-25: 30 mg via INTRAVENOUS

## 2023-01-25 MED ORDER — ACETAMINOPHEN 10 MG/ML IV SOLN: 1000.0000 mg | Freq: Once | INTRAVENOUS | Status: DC | PRN

## 2023-01-25 MED ORDER — CHLORHEXIDINE GLUCONATE 0.12 % MT SOLN
15.0000 mL | Freq: Once | OROMUCOSAL | Status: AC
  Administered 2023-01-25: 15 mL via OROMUCOSAL
  Filled 2023-01-25: qty 15

## 2023-01-25 MED ORDER — FENTANYL CITRATE (PF) 100 MCG/2ML IJ SOLN
INTRAMUSCULAR | Status: DC | PRN
  Administered 2023-01-25: 25 ug via INTRAVENOUS

## 2023-01-25 MED ORDER — AMOXICILLIN-POT CLAVULANATE 500-125 MG PO TABS: 1.0000 | ORAL_TABLET | Freq: Two times a day (BID) | ORAL | 0 refills | Status: AC

## 2023-01-25 MED ORDER — OXYCODONE HCL 5 MG PO TABS: 5.0000 mg | ORAL_TABLET | Freq: Once | ORAL | Status: DC | PRN

## 2023-01-25 MED ORDER — VANCOMYCIN HCL IN DEXTROSE 1-5 GM/200ML-% IV SOLN
1000.0000 mg | Freq: Once | INTRAVENOUS | Status: AC
  Administered 2023-01-25: 1000 mg via INTRAVENOUS

## 2023-01-25 MED ORDER — PROPOFOL 1000 MG/100ML IV EMUL
INTRAVENOUS | Status: AC
  Filled 2023-01-25: qty 100

## 2023-01-25 MED ORDER — FENTANYL CITRATE (PF) 100 MCG/2ML IJ SOLN: 25.0000 ug | INTRAMUSCULAR | Status: DC | PRN

## 2023-01-25 MED ORDER — MIDAZOLAM HCL 2 MG/2ML IJ SOLN
INTRAMUSCULAR | Status: DC | PRN
  Administered 2023-01-25: 2 mg via INTRAVENOUS

## 2023-01-25 MED ORDER — OXYCODONE HCL 5 MG PO TABS
5.0000 mg | ORAL_TABLET | Freq: Four times a day (QID) | ORAL | 0 refills | Status: AC | PRN
Start: 2023-01-25 — End: 2023-02-01

## 2023-01-25 MED ORDER — OXYCODONE HCL 5 MG/5ML PO SOLN: 5.0000 mg | Freq: Once | ORAL | Status: DC | PRN

## 2023-01-25 MED ORDER — DEXMEDETOMIDINE HCL IN NACL 80 MCG/20ML IV SOLN
INTRAVENOUS | Status: AC
  Filled 2023-01-25: qty 20

## 2023-01-25 MED ORDER — BUPIVACAINE HCL (PF) 0.25 % IJ SOLN
INTRAMUSCULAR | Status: DC | PRN
  Administered 2023-01-25: 10 mL

## 2023-01-25 MED ORDER — BUPIVACAINE HCL (PF) 0.25 % IJ SOLN
INTRAMUSCULAR | Status: AC
  Filled 2023-01-25: qty 30

## 2023-01-25 MED ORDER — LIDOCAINE 2% (20 MG/ML) 5 ML SYRINGE
INTRAMUSCULAR | Status: DC | PRN
  Administered 2023-01-25: 50 mg via INTRAVENOUS

## 2023-01-25 MED ORDER — ONDANSETRON HCL 4 MG/2ML IJ SOLN: 4.0000 mg | Freq: Once | INTRAMUSCULAR | Status: DC | PRN

## 2023-01-25 SURGICAL SUPPLY — 47 items
BAG COUNTER SPONGE SURGICOUNT (BAG) ×1 IMPLANT
BAG SPNG CNTER NS LX DISP (BAG) ×1
BNDG CMPR 5X3 KNIT ELC UNQ LF (GAUZE/BANDAGES/DRESSINGS) ×1
BNDG CMPR 75X21 PLY HI ABS (MISCELLANEOUS)
BNDG COHESIVE 1X5 TAN STRL LF (GAUZE/BANDAGES/DRESSINGS) ×1 IMPLANT
BNDG ELASTIC 2X5.8 VLCR STR LF (GAUZE/BANDAGES/DRESSINGS) ×1 IMPLANT
BNDG ELASTIC 3INX 5YD STR LF (GAUZE/BANDAGES/DRESSINGS) IMPLANT
BNDG ELASTIC 4X5.8 VLCR STR LF (GAUZE/BANDAGES/DRESSINGS) IMPLANT
BNDG GAUZE DERMACEA FLUFF 4 (GAUZE/BANDAGES/DRESSINGS) ×1 IMPLANT
BNDG GZE DERMACEA 4 6PLY (GAUZE/BANDAGES/DRESSINGS) ×1
CORD BIPOLAR FORCEPS 12FT (ELECTRODE) ×1 IMPLANT
COVER SURGICAL LIGHT HANDLE (MISCELLANEOUS) ×1 IMPLANT
CUFF TOURN SGL QUICK 18X4 (TOURNIQUET CUFF) ×1 IMPLANT
CUFF TOURN SGL QUICK 24 (TOURNIQUET CUFF)
CUFF TRNQT CYL 24X4X16.5-23 (TOURNIQUET CUFF) IMPLANT
DRAPE SURG 17X23 STRL (DRAPES) ×1 IMPLANT
DRSG ADAPTIC 3X8 NADH LF (GAUZE/BANDAGES/DRESSINGS) ×1 IMPLANT
DRSG XEROFORM 1X8 (GAUZE/BANDAGES/DRESSINGS) IMPLANT
GAUZE SPONGE 2X2 8PLY STRL LF (GAUZE/BANDAGES/DRESSINGS) IMPLANT
GAUZE SPONGE 4X4 12PLY STRL (GAUZE/BANDAGES/DRESSINGS) IMPLANT
GAUZE STRETCH 2X75IN STRL (MISCELLANEOUS) IMPLANT
GLOVE BIO SURGEON STRL SZ7 (GLOVE) ×1 IMPLANT
GLOVE SURG UNDER POLY LF SZ7 (GLOVE) ×1 IMPLANT
GOWN STRL REUS W/ TWL LRG LVL3 (GOWN DISPOSABLE) ×2 IMPLANT
GOWN STRL REUS W/ TWL XL LVL3 (GOWN DISPOSABLE) ×1 IMPLANT
GOWN STRL REUS W/TWL LRG LVL3 (GOWN DISPOSABLE) ×2
GOWN STRL REUS W/TWL XL LVL3 (GOWN DISPOSABLE) ×1
KIT BASIN OR (CUSTOM PROCEDURE TRAY) ×1 IMPLANT
KIT TURNOVER KIT B (KITS) ×1 IMPLANT
MANIFOLD NEPTUNE II (INSTRUMENTS) ×1 IMPLANT
NDL HYPO 25GX1X1/2 BEV (NEEDLE) IMPLANT
NEEDLE HYPO 25GX1X1/2 BEV (NEEDLE) IMPLANT
NS IRRIG 1000ML POUR BTL (IV SOLUTION) ×1 IMPLANT
PACK ORTHO EXTREMITY (CUSTOM PROCEDURE TRAY) ×1 IMPLANT
PAD ARMBOARD 7.5X6 YLW CONV (MISCELLANEOUS) ×2 IMPLANT
PAD CAST 4YDX4 CTTN HI CHSV (CAST SUPPLIES) IMPLANT
PADDING CAST COTTON 4X4 STRL (CAST SUPPLIES)
SOAP 2 % CHG 4 OZ (WOUND CARE) ×1 IMPLANT
SPECIMEN JAR SMALL (MISCELLANEOUS) ×1 IMPLANT
SUT ETHILON 4 0 PS 2 18 (SUTURE) IMPLANT
SUT MERSILENE 4 0 P 3 (SUTURE) IMPLANT
SUT PROLENE 4 0 PS 2 18 (SUTURE) IMPLANT
SYR CONTROL 10ML LL (SYRINGE) IMPLANT
TOWEL GREEN STERILE (TOWEL DISPOSABLE) ×1 IMPLANT
TOWEL GREEN STERILE FF (TOWEL DISPOSABLE) ×1 IMPLANT
TUBE CONNECTING 12X1/4 (SUCTIONS) IMPLANT
WATER STERILE IRR 1000ML POUR (IV SOLUTION) ×1 IMPLANT

## 2023-01-25 NOTE — Op Note (Signed)
Date of Surgery: 01/25/2023  INDICATIONS: Patient is a 57 y.o.-year-old female with poorly controlled diabetes resulting in multiple amputations of nearly all fingers on bilateral hands.  She recently underwent amputation of the left ring finger for acute osteomyelitis.  She unfortunately developed worsening infection with wound dehiscence.  She presented to my office today and was subsequently posted for surgery this evening for infection source control.  Risks, benefits, and alternatives to surgery were again discussed with the patient in the preoperative area. The patient wishes to proceed with surgery.  Informed consent was signed after our discussion.   PREOPERATIVE DIAGNOSIS:  Left ring finger infection  POSTOPERATIVE DIAGNOSIS: Same.  PROCEDURE:  Amputation of the left ring finger through the proximal phalanx   SURGEON: Waylan Rocher, M.D.  ASSIST: None  ANESTHESIA:  Local + MAC  IV FLUIDS AND URINE: See anesthesia.  ESTIMATED BLOOD LOSS: <5 mL.  IMPLANTS: * No implants in log *   DRAINS: None  COMPLICATIONS: None  DESCRIPTION OF PROCEDURE: The patient was met in the preoperative holding area where the surgical site was marked and the consent form was signed.  The patient was then taken to the operating room and transferred to the operating table.  All bony prominences were well padded.  A tourniquet was applied to the left forearm but no inflated.  Monitored sedation was induced.  A formal time-out was performed to confirm that this was the correct patient, surgery, side, and site. Following formal timeout , a digital block was performed using 10 cc of 0.25 % plain marcaine.  The operative extremity was prepped and draped in the usual and sterile fashion.   Following a second formal timeout, 1/4 inch Penrose drain was wrapped around the base of the ring finger, pulled taut, and clamped with a hemostat.  A fishmouth incision was designed just proximal to the margin of  healthy and diseased appearing skin.  The skin was incised.  Sharp dissection was carried down through the extensor apparatus dorsally.  I then continued by incision volarly.  The flexor tendon sheath was identified.  The flexor tendons were pulled distally and transected.  A PIP disarticulation was performed.  I then used a rongeurs to remove the articular cartilage from the head of the proximal phalanx to allow for a tension-free closure.  I then used a tenotomy scissor to identify the radial and ulnar neurovascular bundles.  The radial and ulnar digital nerves were identified and traction neurectomies were performed.  The wound was then thoroughly irrigated with copious sterile saline.  The digital arteries and dorsal veins were coagulated with bipolar cautery.  The wound was then thoroughly irrigated with copious sterile saline.  The incision was closed using a 4-0 nylon suture in simple fashion.  The tourniquet was then removed.  The tip of the remaining finger was pink and well-perfused with brisk capillary refill.  The finger was then dressed with Xeroform, bacitracin ointment, 4 x 4's, Kling wrap, and a 2 inch Ace wrap.  The patient was reversed from sedation.  They were transferred from the operating table to the postoperative bed.  All counts were correct x 2 at the end of the procedure.  The patient was then taken to the PACU in stable condition.   POSTOPERATIVE PLAN: We discharged home on oral antibiotics and given a prescription for pain medication.  The previous culture was have all grown MRSA so we will provide appropriate antibiotic coverage.  I will see her back inmy  office in 7 to 10 days for a wound check.  Waylan Rocher, MD 6:41 PM

## 2023-01-25 NOTE — Anesthesia Postprocedure Evaluation (Signed)
Anesthesia Post Note  Patient: [name]  Procedure(s) Performed: AMPUTATION OF LEFT RING FINGER (Left)     Patient location during evaluation: PACU Anesthesia Type: MAC Level of consciousness: awake and alert Pain management: pain level controlled Vital Signs Assessment: post-procedure vital signs reviewed and stable Respiratory status: spontaneous breathing, nonlabored ventilation, respiratory function stable and patient connected to nasal cannula oxygen Cardiovascular status: stable and blood pressure returned to baseline Postop Assessment: no apparent nausea or vomiting Anesthetic complications: no   No notable events documented.  Last Vitals:  Vitals:   01/25/23 1845 01/25/23 1900  BP: 115/63 123/61  Pulse: 78 71  Resp: 16 15  Temp:  36.6 C  SpO2: 94% 98%    Last Pain:  Vitals:   01/25/23 1842  TempSrc:   PainSc: 0-No pain                 Collene Schlichter

## 2023-01-25 NOTE — Anesthesia Procedure Notes (Signed)
Procedure Name: MAC Date/Time: 01/25/2023 5:55 PM  Performed by: Randon Goldsmith, CRNAPre-anesthesia Checklist: Patient identified, Emergency Drugs available, Suction available and Patient being monitored Patient Re-evaluated:Patient Re-evaluated prior to induction Oxygen Delivery Method: Simple face mask Preoxygenation: Pre-oxygenation with 100% oxygen

## 2023-01-25 NOTE — Transfer of Care (Signed)
Immediate Anesthesia Transfer of Care Note  Patient: [name]  Procedure(s) Performed: AMPUTATION OF LEFT RING FINGER (Left)  Patient Location: PACU  Anesthesia Type:MAC  Level of Consciousness: awake, alert , and oriented  Airway & Oxygen Therapy: Patient Spontanous Breathing  Post-op Assessment: Report given to RN and Post -op Vital signs reviewed and stable  Post vital signs: Reviewed and stable  Last Vitals:  Vitals Value Taken Time  BP 105/59 01/25/23 1842  Temp    Pulse 76 01/25/23 1844  Resp 15 01/25/23 1844  SpO2 100 % 01/25/23 1844  Vitals shown include unfiled device data.  Last Pain:  Vitals:   01/25/23 1650  TempSrc: Oral  PainSc:          Complications: No notable events documented.

## 2023-01-25 NOTE — Interval H&P Note (Signed)
History and Physical Interval Note:  01/25/2023 5:42 PM  [name]  has presented today for surgery, with the diagnosis of Left ring finger infection.  The various methods of treatment have been discussed with the patient and family. After consideration of risks, benefits and other options for treatment, the patient has consented to  Procedure(s) with comments: AMPUTATION OF LEFT RING FINGER (Left) - local 30 room 3 as a surgical intervention.  The patient's history has been reviewed, patient examined, no change in status, stable for surgery.  I have reviewed the patient's chart and labs.  Questions were answered to the patient's satisfaction.     Toneshia Coello [employer]

## 2023-01-25 NOTE — Anesthesia Preprocedure Evaluation (Signed)
Anesthesia Evaluation  Patient identified by MRN, date of birth, ID band Patient awake    Reviewed: Allergy & Precautions, NPO status , Patient's Chart, lab work & pertinent test results, reviewed documented beta blocker date and time   History of Anesthesia Complications Negative for: history of anesthetic complications  Airway Mallampati: II  TM Distance: >3 FB Neck ROM: Full    Dental no notable dental hx.    Pulmonary sleep apnea and Continuous Positive Airway Pressure Ventilation , pneumonia, resolved, neg COPD   breath sounds clear to auscultation       Cardiovascular (-) angina + DOE  (-) CAD, (-) Past MI, (-) Cardiac Stents and (-) CABG  Rhythm:Regular Rate:Normal     Neuro/Psych  Headaches, neg Seizures PSYCHIATRIC DISORDERS  Depression     Neuromuscular disease    GI/Hepatic ,GERD  ,,(+) neg Cirrhosis        Endo/Other  diabetes, Poorly Controlled, Type 2    Renal/GU Renal disease     Musculoskeletal  (+) Arthritis ,    Abdominal  (+) + obese  Peds  Hematology  (+) Blood dyscrasia, anemia   Anesthesia Other Findings   Reproductive/Obstetrics                             Anesthesia Physical Anesthesia Plan  ASA: 3  Anesthesia Plan: MAC   Post-op Pain Management:    Induction: Intravenous  PONV Risk Score and Plan: 2 and Ondansetron  Airway Management Planned:   Additional Equipment:   Intra-op Plan:   Post-operative Plan:   Informed Consent: I have reviewed the patients History and Physical, chart, labs and discussed the procedure including the risks, benefits and alternatives for the proposed anesthesia with the patient or authorized representative who has indicated his/her understanding and acceptance.     Dental advisory given  Plan Discussed with: CRNA  Anesthesia Plan Comments:        Anesthesia Quick Evaluation

## 2023-01-25 NOTE — H&P (Signed)
HAND SURGERY   HPI: Patient is a 57 y.o. female who presents with persistent infection involving the left ring finger.  Patient denies any changes to their medical history or new systemic symptoms today.    Past Medical History:  Diagnosis Date   Acute bacterial bronchitis 05/16/2022   in [place]   Anemia    hx   Arthritis    Charcot's joint of right foot    Hx - DM 2   Depression 02/21/2018   patient denies this dx - pt states that she uses Cymbalta for pain mgmt along with Voltaren   Diabetes mellitus without complication (HCC)    type 2   DOE (dyspnea on exertion) 07/12/2017   FUO (fever of unknown origin) 07/12/2017   Gastroenteritis 04/2016   GERD (gastroesophageal reflux disease)    Headache(784.0)    hormonal headache - no problems since menopause   HLD (hyperlipidemia)    crestor   Low back pain 07/12/2017   Neuromuscular disorder (HCC)    neuropathy - right foot and hands   Obese    Pleuritic chest pain 07/12/2017   Pneumonia    hx 20+ years ago   RSV infection 03/2022   resolved   Sleep apnea    does not use CPAP   Ulcer of foot (HCC)    toe ulcer greater than a year ago   Past Surgical History:  Procedure Laterality Date   AMPUTATION Right 06/11/2017   Procedure: RIGHT LONG FINGER AMPUTATION DIGIT;  Surgeon: Mack Hook, MD;  Location: [facility] OR;  Service: Orthopedics;  Laterality: Right;   AMPUTATION Right 02/22/2018   Procedure: AMPUTATION RIGHT INDEX FINGER;  Surgeon: Knute Neu, MD;  Location: [facility] ORS;  Service: Plastics;  Laterality: Right;   AMPUTATION Left 07/02/2022   Procedure: AMPUTATION OF LEFT MIDDLE FINGER;  Surgeon: Marlyne Beards, MD;  Location: [facility] OR;  Service: Orthopedics;  Laterality: Left;   AMPUTATION Right 11/05/2022   Procedure: Right ring finger amputation;  Surgeon: Marlyne Beards, MD;  Location: [facility] OR;  Service: Orthopedics;  Laterality: Right;  local 60   AMPUTATION Left 01/14/2023   Procedure: Left ring finger amputation;   Surgeon: Marlyne Beards, MD;  Location: [facility] OR;  Service: Orthopedics;  Laterality: Left;  local 60   ANKLE FUSION Left 08/03/2014   Procedure: Left Tibiocalcaneal Fusion;  Surgeon: Nadara Mustard, MD;  Location: [facility] OR;  Service: Orthopedics;  Laterality: Left;   APPLICATION OF WOUND VAC Left 08/03/2014   Procedure: APPLICATION OF WOUND VAC;  Surgeon: Nadara Mustard, MD;  Location: [facility] OR;  Service: Orthopedics;  Laterality: Left;   BELOW KNEE LEG AMPUTATION Left 10/08/2017   in [place]   I & D EXTREMITY Left 08/01/2020   Procedure: IRRIGATION AND DEBRIDEMENT EXTREMITY,PARTIAL AMPUTATION LEFT INDEX FINGER;  Surgeon: Ernest Mallick, MD;  Location: [facility];  Service: Orthopedics;  Laterality: Left;   I & D EXTREMITY Left 08/06/2020   Procedure: IRRIGATION AND DEBRIDEMENT EXTREMITY;  Surgeon: Ernest Mallick, MD;  Location: [facility];  Service: Orthopedics;  Laterality: Left;   I & D KNEE WITH POLY EXCHANGE Right 01/31/2013   Procedure: IRRIGATION AND DEBRIDEMENT KNEE WITH POLY EXCHANGE possible Antibiotic Spacer;  Surgeon: Valeria Batman, MD;  Location: [facility] OR;  Service: Orthopedics;  Laterality: Right;   INCISION AND DRAINAGE Right 01/31/2013   ANTIBIOTIC SPACER  RIGHT KNEE  DR [facility]    INCISION AND DRAINAGE ABSCESS Left 08/03/2020   Procedure: INCISION AND DEBRIDEMENT  LEFT INDEX FINGER;  Surgeon: Ernest Mallick, MD;  Location: [facility];  Service: Orthopedics;  Laterality: Left;   KNEE ARTHROSCOPY Left    MRI  04/07/2019   in [place]   ORIF ANKLE FRACTURE Left 03/16/2014   Procedure: Foot Excision Charcot Collapse,  Internal Fixation;  Surgeon: Nadara Mustard, MD;  Location: [facility] OR;  Service: Orthopedics;  Laterality: Left;   REPLACEMENT TOTAL KNEE Left    TOTAL KNEE ARTHROPLASTY  06/23/2011   Procedure: RIGHT TOTAL KNEE ARTHROPLASTY;  Surgeon: Valeria Batman, MD;  Location[facility] OR;  Service: Orthopedics;  Laterality: Right;  RIGHT TOTAL KNEE REPLACEMENT   Social History    Socioeconomic History   Marital status: Married    Spouse name: Not on file   Number of children: Not on file   Years of education: Not on file   Highest education level: Not on file  Occupational History   Not on file  Tobacco Use   Smoking status: Never   Smokeless tobacco: Never  Vaping Use   Vaping status: Never Used  Substance and Sexual Activity   Alcohol use: Yes    Comment: occ a mixed drink/socially   Drug use: No   Sexual activity: Yes    Birth control/protection: Post-menopausal  Other Topics Concern   Not on file  Social History Narrative   Not on file   Social Determinants of Health   Financial Resource Strain: Low Risk  (08/02/2022)   Received from [facility], [facility]   Overall Financial Resource Strain (CARDIA)    Difficulty of Paying Living Expenses: Not very hard  Food Insecurity: No Food Insecurity (08/02/2022)   Received from [facility], [facility]   Hunger Vital Sign    Worried About Running Out of Food in the Last Year: Never true    Ran Out of Food in the Last Year: Never true  Transportation Needs: No Transportation Needs (08/02/2022)   Received from [facility], [facility]   PRAPARE - Transportation    Lack of Transportation (Medical): No    Lack of Transportation (Non-Medical): No  Physical Activity: Insufficiently Active (08/02/2022)   Received from [facility], [facility]   Exercise Vital Sign    Days of Exercise per Week: 3 days    Minutes of Exercise per Session: 20 min  Stress: No Stress Concern Present (08/02/2022)   Received from [facility], [facility] Occupational [facility] - Occupational Stress Questionnaire    Feeling of Stress : Not at all  Social Connections: Moderately Integrated (08/02/2022)   Received from [facility], [facility]   Social Network    How would you rate your social network (family, work, friends)?: Adequate participation with social networks   Family History   Problem Relation Age of Onset   Anesthesia problems Father    Stroke Father    Hypotension Neg Hx    Malignant hyperthermia Neg Hx    Pseudochol deficiency Neg Hx    - negative except otherwise stated in the family history section No Known Allergies Prior to Admission medications   Medication Sig Start Date End Date Taking? Authorizing Provider  amoxicillin-clavulanate (AUGMENTIN) 500-125 MG tablet Take 1 tablet by mouth 2 (two) times daily.   Yes [provider]  collagenase (SANTYL) 250 UNIT/GM ointment Apply 1 Application topically at bedtime.   Yes [provider]  dapagliflozin propanediol (FARXIGA) 10 MG TABS tablet Take 10 mg by mouth in the  morning.   Yes [provider]  diclofenac (VOLTAREN) 75 MG EC tablet Take 75 mg by mouth 2 (two) times daily.  02/28/14  Yes [provider]  doxycycline (VIBRAMYCIN) 50 MG capsule Take 50 mg by mouth 2 (two) times daily.   Yes [provider]  Dulaglutide (TRULICITY) 1.5 MG/0.5ML SOPN Inject 1.5 mg into the skin every Monday.   Yes [provider]  gabapentin (NEURONTIN) 600 MG tablet Take 600 mg by mouth See admin instructions. Take 1 tablet (600 mg) by mouth scheduled twice daily (morning & night), may take an additional dose at noon if needed for pain. 04/23/19  Yes [provider]  glipiZIDE (GLUCOTROL XL) 10 MG 24 hr tablet Take 10 mg by mouth daily with breakfast.  05/23/17  Yes [provider]  GLUCOSAMINE CHONDROITIN MSM PO Take 2 tablets by mouth in the morning.   Yes [provider]  HYDROcodone-acetaminophen (NORCO) 10-325 MG tablet Take 1 tablet by mouth daily as needed for severe pain.   Yes [provider]  Insulin Glargine (BASAGLAR KWIKPEN) 100 UNIT/ML Inject 50 Units into the skin daily. May substitute as needed per insurance. Patient taking differently: Inject 60 Units into the skin in the morning. 07/06/22 11/04/23 Yes Dahal, Melina Schools, MD   insulin lispro (HUMALOG KWIKPEN) 100 UNIT/ML KwikPen Inject 20-25 Units into the skin with breakfast, with lunch, and with evening meal.   Yes [provider]  metFORMIN (GLUCOPHAGE) 1000 MG tablet Take 1,000 mg by mouth 2 (two) times daily.   Yes [provider]  Multiple Vitamin (MULTIVITAMIN WITH MINERALS) TABS Take 1 tablet by mouth in the morning.   Yes [provider]  oxybutynin (DITROPAN-XL) 10 MG 24 hr tablet Take 10 mg by mouth in the morning.   Yes [provider]  pantoprazole (PROTONIX) 40 MG tablet Take 40 mg by mouth daily before breakfast. 05/13/22  Yes [provider]  rosuvastatin (CRESTOR) 20 MG tablet Take 20 mg by mouth at bedtime.   Yes [provider]  Blood Glucose Monitoring Suppl DEVI 1 each by Does not apply route 3 (three) times daily. May dispense any manufacturer covered by patient's insurance. 07/06/22   Lorin Glass, MD  Glucose Blood (BLOOD GLUCOSE TEST STRIPS) STRP 1 each by Does not apply route 3 (three) times daily. Use as directed to check blood sugar. May dispense any manufacturer covered by patient's insurance and fits patient's device. 07/06/22   Dahal, Melina Schools, MD  Insulin Pen Needle (PEN NEEDLES) 31G X 5 MM MISC 1 each by Does not apply route 3 (three) times daily. May dispense any manufacturer covered by patient's insurance. 07/06/22   Lorin Glass, MD  Lancet Device MISC 1 each by Does not apply route 3 (three) times daily. May dispense any manufacturer covered by patient's insurance. 07/06/22   Lorin Glass, MD  Lancets MISC 1 each by Does not apply route 3 (three) times daily. Use as directed to check blood sugar. May dispense any manufacturer covered by patient's insurance and fits patient's device. 07/06/22   Lorin Glass, MD   No results found. - Positive ROS: All other systems have been reviewed and were otherwise negative with the exception of those mentioned in the HPI and as above.  Physical  Exam: General: No acute distress, resting comfortably Cardiovascular: BUE warm and well perfused, normal rate Respiratory: Normal WOB on RA Skin: Warm and dry Neurologic: Sensation diminished at baseline Psychiatric: Patient is at baseline mood and affect  Left Upper Extremity  Dehiscence of ring finger wound with scant purulence.  Erythema from the tip of the residual finger to the level of the PIP joint.    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   Plan: OR today for revision amputation of left ring finger. We again reviewed the risks of surgery which include bleeding, infection, damage to neurovascular structures, persistent symptoms, persistent infection, need for additional surgery.  Informed consent was signed.  All questions were answered.   Marlyne Beards, M.D. [facility] 5:39 PM

## 2023-01-26 ENCOUNTER — Encounter (HOSPITAL_COMMUNITY): Payer: Self-pay | Admitting: Orthopedic Surgery

## 2023-01-26 ENCOUNTER — Encounter (HOSPITAL_BASED_OUTPATIENT_CLINIC_OR_DEPARTMENT_OTHER): Payer: Managed Care, Other (non HMO) | Attending: Internal Medicine | Admitting: Internal Medicine

## 2023-01-26 ENCOUNTER — Other Ambulatory Visit: Payer: Self-pay | Admitting: Orthopedic Surgery

## 2023-01-26 DIAGNOSIS — E11621 Type 2 diabetes mellitus with foot ulcer: Secondary | ICD-10-CM | POA: Insufficient documentation

## 2023-01-26 DIAGNOSIS — L97518 Non-pressure chronic ulcer of other part of right foot with other specified severity: Secondary | ICD-10-CM | POA: Diagnosis not present

## 2023-01-26 DIAGNOSIS — Z89512 Acquired absence of left leg below knee: Secondary | ICD-10-CM | POA: Diagnosis not present

## 2023-01-26 DIAGNOSIS — Z89021 Acquired absence of right finger(s): Secondary | ICD-10-CM | POA: Insufficient documentation

## 2023-01-26 DIAGNOSIS — Z89022 Acquired absence of left finger(s): Secondary | ICD-10-CM | POA: Diagnosis not present

## 2023-01-26 MED ORDER — SULFAMETHOXAZOLE-TRIMETHOPRIM 800-160 MG PO TABS: 1.0000 | ORAL_TABLET | Freq: Two times a day (BID) | ORAL | 0 refills | Status: AC

## 2023-01-27 LAB — SURGICAL PATHOLOGY

## 2023-01-28 NOTE — Progress Notes (Signed)
KEYON, WINNICK R ([id number]) [id number] Page 1 of 8 Visit Report for 01/26/2023 Arrival Information Details Patient Name: Date of Service: Joylene Draft NDRA R. 01/26/2023 1:15 PM Medical Record Number: [id number] Patient Account Number: [id number] Date of Birth/Sex: Treating RN: 1966-01-06 (57 y.o. F) Primary Care Solyana Nonaka: Antony Haste Other Clinician: Referring Aristea Posada: Treating Charlcie Prisco/Extender: Murtis Sink in Treatment: 9 Visit Information History Since Last Visit Added or deleted any medications: No Patient Arrived: Ambulatory Any new allergies or adverse reactions: No Arrival Time: 13:21 Had a fall or experienced change in No Accompanied By: self activities of daily living that may affect Transfer Assistance: None risk of falls: Patient Identification Verified: Yes Signs or symptoms of abuse/neglect since last visito No Secondary Verification Process Completed: Yes Hospitalized since last visit: No Patient Requires Transmission-Based Precautions: No Implantable device outside of the clinic excluding No Patient Has Alerts: No cellular tissue based products placed in the center since last visit: Has Dressing in Place as Prescribed: Yes Has Compression in Place as Prescribed: Yes Pain Present Now: No Electronic Signature(s) Signed: 01/28/2023 3:10:19 PM By: Karie Schwalbe RN Signed: 01/28/2023 3:52:59 PM By: Thayer Dallas Entered By: Thayer Dallas on 01/26/2023 10:24:58 -------------------------------------------------------------------------------- Encounter Discharge Information Details Patient Name: Date of Service: Joylene Draft NDRA R. 01/26/2023 1:15 PM Medical Record Number: [id number] Patient Account Number: [id number] Date of Birth/Sex: Treating RN: Jun 16, 1965 (57 y.o. [name] Primary Care Jeric Slagel: Antony Haste Other Clinician: Referring Aziyah Provencal: Treating Ileene Allie/Extender: Murtis Sink in Treatment: 9 Encounter Discharge Information Items Post Procedure Vitals Discharge Condition: Stable Temperature (F): 98.1 Ambulatory Status: Ambulatory Pulse (bpm): 81 Discharge Destination: Home Respiratory Rate (breaths/min): 20 Transportation: Private Auto Blood Pressure (mmHg): 109/73 Accompanied By: self Schedule Follow-up Appointment: Yes Clinical Summary of Care: Electronic Signature(s) Signed: 01/26/2023 4:57:20 PM By: Shawn Stall RN, BSN Entered By: Shawn Stall on 01/26/2023 10:55:26 Hinojosa, Ahnna R ([id number]) [id number] Page 2 of 8 -------------------------------------------------------------------------------- Lower Extremity Assessment Details Patient Name: Date of Service: Joylene Draft NDRA R. 01/26/2023 1:15 PM Medical Record Number: [id number] Patient Account Number: [id number] Date of Birth/Sex: Treating RN: 12-15-1965 (57 y.o. F) Primary Care Josua Ferrebee: Antony Haste Other Clinician: Referring Jeniyah Menor: Treating Zoii Florer/Extender: Murtis Sink in Treatment: 9 Edema Assessment Assessed: [name]: No] [name]: No] Edema: [Left: Ye] [Right: s] Calf Left: Right: Point of Measurement: 34.5 cm From Medial Instep 41.5 cm Ankle Left: Right: Point of Measurement: 12.5 cm From Medial Instep 25 cm Vascular Assessment Extremity colors, hair growth, and conditions: Extremity Color: [Right:Normal] Hair Growth on Extremity: [Right:No] Temperature of Extremity: [Right:Warm] Capillary Refill: [Right:< 3 seconds] Dependent Rubor: [Right:No No] Electronic Signature(s) Signed: 01/28/2023 3:10:19 PM By: Karie Schwalbe RN Signed: 01/28/2023 3:52:59 PM By: Thayer Dallas Entered By: Thayer Dallas on 01/26/2023 10:32:48 -------------------------------------------------------------------------------- Multi Wound Chart Details Patient Name: Date of Service: Joylene Draft NDRA R.  01/26/2023 1:15 PM Medical Record Number: [id number] Patient Account Number: [id number] Date of Birth/Sex: Treating RN: 01/17/1966 (57 y.o. F) Primary Care Keiara Sneeringer: Antony Haste Other Clinician: Referring Deziree Mokry: Treating Brynlee Pennywell/Extender: Murtis Sink in Treatment: 9 Vital Signs Height(in): 64 Capillary Blood Glucose(mg/dl): 89 Weight(lbs): 557 Pulse(bpm): 81 Body Mass Index(BMI): 44.8 Blood Pressure(mmHg): 109/73 Temperature(F): 98.1 Respiratory Rate(breaths/min): 18 [4:Photos:] [N/A:N/A] Right, Lateral Ankle N/A N/A Wound Location: Trauma N/A N/A Wounding Event: Diabetic Wound/Ulcer of the Lower N/A N/A Primary Etiology: Extremity Anemia, Sleep Apnea, Type II N/A N/A Comorbid History: Diabetes, Gout, Osteoarthritis, Osteomyelitis,  Neuropathy 07/27/2022 N/A N/A Date Acquired: 9 N/A N/A Weeks of Treatment: Open N/A N/A Wound Status: No N/A N/A Wound Recurrence: Yes N/A N/A Pending A mputation on Presentation: 0.7x0.9x0.1 N/A N/A Measurements L x W x D (cm) 0.495 N/A N/A A (cm) : rea 0.049 N/A N/A Volume (cm) : 82.90% N/A N/A % Reduction in A rea: 91.50% N/A N/A % Reduction in Volume: Grade 1 N/A N/A Classification: Medium N/A N/A Exudate A mount: Serosanguineous N/A N/A Exudate Type: red, brown N/A N/A Exudate Color: Distinct, outline attached N/A N/A Wound Margin: Large (67-100%) N/A N/A Granulation A mount: Pink N/A N/A Granulation Quality: Small (1-33%) N/A N/A Necrotic A mount: Fat Layer (Subcutaneous Tissue): Yes N/A N/A Exposed Structures: Fascia: No Tendon: No Muscle: No Joint: No Bone: No Small (1-33%) N/A N/A Epithelialization: Chemical/Enzymatic/Mechanical N/A N/A Debridement: N/A N/A N/A Instrument: None N/A N/A Bleeding: Debridement Treatment Response: Procedure was tolerated well N/A N/A Post Debridement Measurements L x 0.7x0.9x0.1 N/A N/A W x D (cm) 0.049 N/A N/A Post Debridement Volume:  (cm) Excoriation: No N/A N/A Periwound Skin Texture: Induration: No Callus: No Crepitus: No Rash: No Scarring: No Maceration: No N/A N/A Periwound Skin Moisture: Dry/Scaly: No Atrophie Blanche: No N/A N/A Periwound Skin Color: Cyanosis: No Ecchymosis: No Erythema: No Hemosiderin Staining: No Mottled: No Pallor: No Rubor: No No Abnormality N/A N/A Temperature: Debridement N/A N/A Procedures Performed: Treatment Notes Electronic Signature(s) Signed: 01/26/2023 4:26:34 PM By: Baltazar Najjar MD Entered By: Baltazar Najjar on 01/26/2023 10:54:46 -------------------------------------------------------------------------------- Multi-Disciplinary Care Plan Details Patient Name: Date of Service: Joylene Draft NDRA R. 01/26/2023 1:15 PM Medical Record Number: [id number] Patient Account Number: [id number] Date of Birth/Sex: Treating RN: 12/26/1965 (57 y.o. [name] Primary Care Emmaleah Meroney: Antony Haste Other Clinician: MERIS, REEDE ([id number]) [id number] Page 4 of 8 Referring Ahan Eisenberger: Treating Loucille Takach/Extender: Murtis Sink in Treatment: 9 Active Inactive Necrotic Tissue Nursing Diagnoses: Knowledge deficit related to management of necrotic/devitalized tissue Goals: Necrotic/devitalized tissue will be minimized in the wound bed Date Initiated: 11/23/2022 Target Resolution Date: 02/19/2023 Goal Status: Active Patient/caregiver will verbalize understanding of reason and process for debridement of necrotic tissue Date Initiated: 11/23/2022 Target Resolution Date: 02/19/2023 Goal Status: Active Interventions: Assess patient pain level pre-, during and post procedure and prior to discharge Provide education on necrotic tissue and debridement process Treatment Activities: Biologic debridement : 11/23/2022 Excisional debridement : 11/23/2022 Notes: Nutrition Nursing Diagnoses: Potential for alteratiion in  Nutrition/Potential for imbalanced nutrition Goals: Patient/caregiver agrees to and verbalizes understanding of need to obtain nutritional consultation Date Initiated: 11/23/2022 Date Inactivated: 12/22/2022 Target Resolution Date: 12/25/2022 Goal Status: Met Patient/caregiver will maintain therapeutic glucose control Date Initiated: 11/23/2022 Target Resolution Date: 02/19/2023 Goal Status: Active Interventions: Assess HgA1c results as ordered upon admission and as needed Provide education on elevated blood sugars and impact on wound healing Provide education on nutrition Treatment Activities: Obtain HgA1c : 11/23/2022 Patient referred to Primary Care Physician for further nutritional evaluation : 11/23/2022 Notes: Pain, Acute or Chronic Nursing Diagnoses: Potential alteration in comfort, pain Goals: Patient will verbalize adequate pain control and receive pain control interventions during procedures as needed Date Initiated: 11/23/2022 Target Resolution Date: 02/19/2023 Goal Status: Active Patient/caregiver will verbalize comfort level met Date Initiated: 11/23/2022 Target Resolution Date: 02/19/2023 Goal Status: Active Interventions: Encourage patient to take pain medications as prescribed Provide education on pain management Reposition patient for comfort Treatment Activities: Administer pain control measures as ordered : 11/23/2022 Notes: Lawlor, Jette R ([id number])  [phone].pdf Page 5 of 8 Wound/Skin Impairment Nursing Diagnoses: Knowledge deficit related to ulceration/compromised skin integrity Goals: Ulcer/skin breakdown will heal within 14 weeks Date Initiated: 11/23/2022 Target Resolution Date: 03/25/2023 Goal Status: Active Interventions: Assess patient/caregiver ability to perform ulcer/skin care regimen upon admission and as needed Assess ulceration(s) every visit Provide education on ulcer and skin care Treatment Activities: Skin  care regimen initiated : 11/23/2022 Topical wound management initiated : 11/23/2022 Notes: Electronic Signature(s) Signed: 01/26/2023 4:57:20 PM By: Shawn Stall RN, BSN Entered By: Shawn Stall on 01/26/2023 10:44:14 -------------------------------------------------------------------------------- Pain Assessment Details Patient Name: Date of Service: Joylene Draft NDRA R. 01/26/2023 1:15 PM Medical Record Number: [id number] Patient Account Number: [id number] Date of Birth/Sex: Treating RN: 05/07/1965 (57 y.o. F) Primary Care Birgitta Uhlir: Antony Haste Other Clinician: Referring Tajia Szeliga: Treating Honey Zakarian/Extender: Murtis Sink in Treatment: 9 Active Problems Location of Pain Severity and Description of Pain Patient Has Paino No Site Locations Pain Management and Medication Current Pain Management: Electronic Signature(s) Signed: 01/28/2023 3:10:19 PM By: Karie Schwalbe RN Signed: 01/28/2023 3:52:59 PM By: Thayer Dallas Entered By: Thayer Dallas on 01/26/2023 10:27:18 Niehaus, Fayrene R ([id number]) [id number] Page 6 of 8 -------------------------------------------------------------------------------- Patient/Caregiver Education Details Patient Name: Date of Service: [name] NDRA R. 10/1/2024andnbsp1:15 PM Medical Record Number: [id number] Patient Account Number: [id number] Date of Birth/Gender: Treating RN: 1965-06-14 (57 y.o. [name] Primary Care Physician: Antony Haste Other Clinician: Referring Physician: Treating Physician/Extender: Murtis Sink in Treatment: 9 Education Assessment Education Provided To: Patient Education Topics Provided Wound/Skin Impairment: Handouts: Caring for Your Ulcer Methods: Explain/Verbal Responses: Reinforcements needed Electronic Signature(s) Signed: 01/26/2023 4:57:20 PM By: Shawn Stall RN, BSN Entered By: Shawn Stall on 01/26/2023  10:44:25 -------------------------------------------------------------------------------- Wound Assessment Details Patient Name: Date of Service: [name] NDRA R. 01/26/2023 1:15 PM Medical Record Number: [id number] Patient Account Number: [id number] Date of Birth/Sex: Treating RN: November 27, 1965 (56 y.o. F) Primary Care Jansel Vonstein: Antony Haste Other Clinician: Referring Kyia Rhude: Treating Oshea Percival/Extender: Murtis Sink in Treatment: 9 Wound Status Wound Number: 4 Primary Diabetic Wound/Ulcer of the Lower Extremity Etiology: Wound Location: Right, Lateral Ankle Wound Open Wounding Event: Trauma Status: Date Acquired: 07/27/2022 Comorbid Anemia, Sleep Apnea, Type II Diabetes, Gout, Osteoarthritis, Weeks Of Treatment: 9 History: Osteomyelitis, Neuropathy Clustered Wound: No Pending Amputation On Presentation Photos ALYS, DULAK R ([id number]) [id number] Page 7 of 8 Wound Measurements Length: (cm) 0.7 Width: (cm) 0.9 Depth: (cm) 0.1 Area: (cm) 0.495 Volume: (cm) 0.049 % Reduction in Area: 82.9% % Reduction in Volume: 91.5% Epithelialization: Small (1-33%) Tunneling: No Undermining: No Wound Description Classification: Grade 1 Wound Margin: Distinct, outline attached Exudate Amount: Medium Exudate Type: Serosanguineous Exudate Color: red, brown Foul Odor After Cleansing: No Slough/Fibrino Yes Wound Bed Granulation Amount: Large (67-100%) Exposed Structure Granulation Quality: Pink Fascia Exposed: No Necrotic Amount: Small (1-33%) Fat Layer (Subcutaneous Tissue) Exposed: Yes Necrotic Quality: Adherent Slough Tendon Exposed: No Muscle Exposed: No Joint Exposed: No Bone Exposed: No Periwound Skin Texture Texture Color No Abnormalities Noted: No No Abnormalities Noted: No Callus: No Atrophie Blanche: No Crepitus: No Cyanosis: No Excoriation: No Ecchymosis: No Induration: No Erythema: No Rash:  No Hemosiderin Staining: No Scarring: No Mottled: No Pallor: No Moisture Rubor: No No Abnormalities Noted: No Dry / Scaly: No Temperature / Pain Maceration: No Temperature: No Abnormality Treatment Notes Wound #4 (Ankle) Wound Laterality: Right, Lateral Cleanser Vashe 5.8 (oz) Discharge Instruction: Cleanse the wound with Vashe prior to applying a clean dressing  using gauze sponges, not tissue or cotton balls. Peri-Wound Care Topical Santyl Collagenase Ointment, 30 (gm), tube Primary Dressing Hydrofera Blue Ready Transfer Foam, 4x5 (in/in) Discharge Instruction: Apply to wound bed as instructed Secondary Dressing Zetuvit Plus Silicone Border Dressing 4x4 (in/in) Discharge Instruction: Apply silicone border over primary dressing as directed. Secured With Compression Wrap Compression Stockings Facilities manager) Signed: 01/28/2023 3:10:19 PM By: Karie Schwalbe RN Signed: 01/28/2023 3:52:59 PM By: Thayer Dallas Entered By: Thayer Dallas on 01/26/2023 10:37:28 Alfieri, Garald Braver ([id number]) [id number] Page 8 of 8 -------------------------------------------------------------------------------- Vitals Details Patient Name: Date of Service: Joylene Draft NDRA R. 01/26/2023 1:15 PM Medical Record Number: [id number] Patient Account Number: [id number] Date of Birth/Sex: Treating RN: February 06, 1966 (57 y.o. F) Primary Care Moriah Shawley: Antony Haste Other Clinician: Referring Georgi Navarrete: Treating Ulric Salzman/Extender: Murtis Sink in Treatment: 9 Vital Signs Time Taken: 13:25 Temperature (F): 98.1 Height (in): 64 Pulse (bpm): 81 Weight (lbs): 261 Respiratory Rate (breaths/min): 18 Body Mass Index (BMI): 44.8 Blood Pressure (mmHg): 109/73 Capillary Blood Glucose (mg/dl): 89 Reference Range: 80 - 120 mg / dl Electronic Signature(s) Signed: 01/28/2023 3:10:19 PM By: Karie Schwalbe RN Signed: 01/28/2023 3:52:59 PM By:  Thayer Dallas Entered By: Thayer Dallas on 01/26/2023 10:27:09

## 2023-01-28 NOTE — Progress Notes (Signed)
[name], [name] ([id number]) [id number] Page 1 of 9 Visit Report for 01/26/2023 Debridement Details Patient Name: Date of Service: [name]. 01/26/2023 1:15 PM Medical Record Number: [id number] Patient Account Number: [id number] Date of Birth/Sex: Treating RN: [birth year] (57 y.o. [name] Primary Care Provider: Antony Haste Other Clinician: Referring Provider: Treating Provider/Extender: Murtis Sink in Treatment: 9 Debridement Performed for Assessment: Wound #4 Right,Lateral Ankle Performed By: Clinician Shawn Stall, RN Debridement Type: Chemical/Enzymatic/Mechanical Agent Used: Santyl Severity of Tissue Pre Debridement: Fat layer exposed Level of Consciousness (Pre-procedure): Awake and Alert Pre-procedure Verification/Time Out No Taken: Percent of Wound Bed Debrided: Bleeding: None Response to Treatment: Procedure was tolerated well Level of Consciousness (Post- Awake and Alert procedure): Post Debridement Measurements of Total Wound Length: (cm) 0.7 Width: (cm) 0.9 Depth: (cm) 0.1 Volume: (cm) 0.049 Character of Wound/Ulcer Post Debridement: Stable Severity of Tissue Post Debridement: Fat layer exposed Post Procedure Diagnosis Same as Pre-procedure Electronic Signature(s) Signed: 01/26/2023 4:26:34 PM By: Baltazar Najjar MD Signed: 01/26/2023 4:57:20 PM By: Shawn Stall RN, BSN Entered By: Shawn Stall on 01/26/2023 10:54:22 -------------------------------------------------------------------------------- HPI Details Patient Name: Date of Service: [name]. 01/26/2023 1:15 PM Medical Record Number: [id number] Patient Account Number: [id number] Date of Birth/Sex: Treating RN: 1966/03/02 (57 y.o. F) Primary Care Provider: Antony Haste Other Clinician: Referring Provider: Treating Provider/Extender: Murtis Sink in Treatment: 9 History of Present  Illness HPI Description: ABI on Right: 1.01 Ms. 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 Most recently she had an amputation of the right ring finger. She presents today with a wound to the right lateral ankle that she has had for 3 months that was caused by her hitting the area with her wheelchair. She has been Vashe dressings to clean the wound bed followed by antibiotic ointment. ABIs in office are 1.01 on the left and she has palpable dorsalis pedis and posterior tibial artery pulses. Her foot is warm and well-perfused. 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 She has been cleaning the area with Vashe and using Neosporin to the wound bed. She currently denies signs of infection. Patient has a history of ankle surgery on 04-07-22 with right partial hardware removal and revision subtalar fusion. [name], [name] ([id number]) [id number] Page 2 of 9 8/6; patient presents for follow-up. She has been using Santyl to the wound bed and has been taking Augmentin and doxycycline without issues. She is scheduled for her MRI towards the end of the month. She has no issues or complaints. 8/13; patient presents for follow-up. She is scheduled for MRI next week. She has been using Santyl and Hydrofera Blue to the wound bed. She is taking Augmentin and doxycycline without issues. She has been on her feet more recently and has noted slightly more swelling to the ankle and more erythema. She denies systemic signs of infection. 8/27; patient presents for follow-up. She is completed 4 weeks of oral antibiotics. She obtained her MRI of the right ankle however this has not been read yet. She denies signs of infection. Wound is smaller. She has been using Santyl and Hydrofera Blue. 9/3; patient presents for follow-up. She had  an MRI completed on 8/27. Reading shows no definitive active osteomyelitis to the wound over the lateral ankle. It did notice  partial tearing of the peroneus brevis and longus, partial tearing of the distal Achilles tendon and partial tearing of the tibialis posterior tendon. She has an appointment with her podiatrist based on these findings next week. She is been using Santyl and Hydrofera Blue to the wound bed. Along with her compression stockings. Wound is smaller. 9/10; patient presents for follow-up. She has been using Santyl and Hydrofera Blue to the wound bed along with her compression stockings daily. Wound is smaller. She has no issues or complaints. Insurance is asking for more recent hemoglobin A1c for potential approval of skin substitute. Patient is going to try and obtain this from her PCP. 9/17; left lateral ankle wound over the lateral malleolus. Everything looks better here healthy granulation we have been using Santyl Hydrofera Blue and border foam she is changing this every second day at home 10/1; this is a patient with a wound over the left lateral ankle/left lateral malleolus. Everything continues to look better. Healthy looking granulation and epithelialization. She has been using Santyl and Hydrofera Blue and border foam her husband changes the dressing every second day at home. Since she has been here she had surgery on her left hand that is wrapped we did not look at this today Electronic Signature(s) Signed: 01/26/2023 4:26:34 PM By: Baltazar Najjar MD Entered By: Baltazar Najjar on 01/26/2023 10:55:46 -------------------------------------------------------------------------------- Physical Exam Details Patient Name: Date of Service: [name]. 01/26/2023 1:15 PM Medical Record Number: [id number] Patient Account Number: [id number] Date of Birth/Sex: Treating RN: [birth year] (57 y.o. F) Primary Care Provider: Antony Haste Other Clinician: Referring  Provider: Treating Provider/Extender: Murtis Sink in Treatment: 9 Constitutional Sitting or standing Blood Pressure is within target range for patient.. Pulse regular and within target range for patient.Marland Kitchen Respirations regular, non-labored and within target range.. Temperature is normal and within the target range for the patient.Marland Kitchen Appears in no distress. Notes Wound exam; the patient has a improving wound on the left lateral malleolus we are using Santyl and Hydrofera Blue and border foam changing every second day the wound looks excellent. Pedal pulses palpable. There is no uncontrolled edema Electronic Signature(s) Signed: 01/26/2023 4:26:34 PM By: Baltazar Najjar MD Entered By: Baltazar Najjar on 01/26/2023 10:58:45 -------------------------------------------------------------------------------- Physician Orders Details Patient Name: Date of Service: [name]. 01/26/2023 1:15 PM Medical Record Number: [id number] Patient Account Number: [id number] Date of Birth/Sex: Treating RN: 1966-01-30 (57 y.o. [name] Primary Care Provider: Antony Haste Other Clinician: Referring Provider: Treating Provider/Extender: Murtis Sink in Treatment: 9 The following information was scribed by: Shawn Stall The information was scribed for: Baltazar Najjar Verbal / Phone Orders: No Sanon, Garald Braver ([id number]) [id number] Page 3 of 9 Diagnosis Coding ICD-10 Coding Code Description E11.621 Type 2 diabetes mellitus with foot ulcer L97.518 Non-pressure chronic ulcer of other part of right foot with other specified severity Z89.512 Acquired absence of left leg below knee O53.664Q 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 - Dr. Leanord Hawking 02/09/2023 1115 Return A Return appointment in 1 month. - Dr. Leanord Hawking -front  office to schedule an appt for later in October. Anesthetic (In clinic) Topical Lidocaine 4% applied to wound bed Bathing/ Shower/ Hygiene May shower with protection but do not get wound dressing(s) wet. Protect dressing(s) with water repellant cover (for example, large plastic bag) or a cast cover and may then take shower. Edema Control - Lymphedema / SCD /  Other Elevate legs to the level of the heart or above for 30 minutes daily and/or when sitting for 3-4 times a day throughout the day. Avoid standing for long periods of time. Patient to wear own compression stockings every day. Wound Treatment Wound #4 - Ankle Wound Laterality: Right, Lateral Cleanser: Vashe 5.8 (oz) 1 x Per Day/30 Days Discharge Instructions: Cleanse the wound with Vashe prior to applying a clean dressing using gauze sponges, not tissue or cotton balls. Topical: Santyl Collagenase Ointment, 30 (gm), tube 1 x Per Day/30 Days Prim Dressing: Hydrofera Blue Ready Transfer Foam, 4x5 (in/in) 1 x Per Day/30 Days ary Discharge Instructions: Apply to wound bed as instructed Secondary Dressing: Zetuvit Plus Silicone Border Dressing 4x4 (in/in) (DME) (Generic) 1 x Per Day/30 Days Discharge Instructions: Apply silicone border over primary dressing as directed. Secondary Dressing: tegaderm (DME) (Generic) 1 x Per Day/30 Days Electronic Signature(s) Signed: 01/26/2023 4:26:34 PM By: Baltazar Najjar MD Signed: 01/26/2023 4:57:20 PM By: Shawn Stall RN, BSN Entered By: Shawn Stall on 01/26/2023 10:56:38 -------------------------------------------------------------------------------- Problem List Details Patient Name: Date of Service: [name]. 01/26/2023 1:15 PM Medical Record Number: [id number] Patient Account Number: [id number] Date of Birth/Sex: Treating RN: [birth year] (57 y.o. 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 01/26/2023 1:15 PM Medical Record Number: [id number] Patient Account Number: [id number] Date of Birth/Sex: Treating RN: [birth year] (57 y.o. F) Primary Care Provider: Antony Haste Other Clinician: Referring Provider: Treating Provider/Extender: Murtis Sink in Treatment: 9 Subjective History of Present Illness (HPI) ABI on Right: 1.01 Ms. 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 Most recently she had an amputation of the right ring finger. She presents today with a wound to the right lateral ankle that she has had for 3 months that was caused by her hitting the area with her wheelchair. She has been Vashe dressings to clean the wound bed followed by antibiotic ointment. ABIs in office are 1.01 on the left and she has palpable dorsalis pedis and posterior tibial artery pulses. Her foot is warm and well-perfused. 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 She has been cleaning the area with Vashe and using Neosporin to the wound bed. She currently denies signs of infection. Patient has a history of ankle surgery on 04-07-22 with right partial hardware removal and revision subtalar fusion. 8/6; patient presents for follow-up. She has been using Santyl to the wound bed and has been taking Augmentin and doxycycline without issues. She is scheduled for her MRI towards the end of the month. She has no issues or complaints. 8/13; patient presents for  follow-up. She is scheduled for MRI next week. She has been using Santyl and Hydrofera Blue to the wound bed. She is taking Augmentin and doxycycline without issues. She has been on her feet more recently and has noted slightly more swelling to the ankle and more erythema. She denies systemic signs of infection. 8/27; patient presents for follow-up. She is completed 4 weeks of oral antibiotics. She obtained her MRI of the right ankle however this has not been read yet. She denies signs of infection. Wound is smaller. She has been using Santyl and Hydrofera Blue. 9/3; patient presents for follow-up. She had an MRI completed on 8/27. Reading shows no definitive active osteomyelitis to the wound over the lateral ankle. It did notice partial tearing of the peroneus brevis and longus, partial tearing of the distal Achilles tendon and partial tearing of the tibialis posterior tendon. She has an appointment with her podiatrist based on these findings next week. She is been using Santyl and Hydrofera Blue to the wound bed. Along with her compression stockings. Wound is smaller. 9/10; patient presents for follow-up. She has been using Santyl and Hydrofera Blue to the wound bed along with her compression stockings daily. Wound is smaller. She has no issues or complaints. Insurance is asking for more recent hemoglobin A1c for potential approval of skin substitute. Patient is going to try and obtain  this from her PCP. 9/17; left lateral ankle wound over the lateral malleolus. Everything looks better here healthy granulation we have been using Santyl Hydrofera Blue and border foam she is changing this every second day at home 10/1; this is a patient with a wound over the left lateral ankle/left lateral malleolus. Everything continues to look better. Healthy looking granulation and epithelialization. She has been using Santyl and Hydrofera Blue and border foam her husband changes the dressing every second day at home. Since she has been here she had surgery on her left hand that is wrapped we did not look at this today Patient History Family History Cancer - Mother, Diabetes - Mother, Stroke - Father, No family history of Heart Disease, Hereditary Spherocytosis, Hypertension, Kidney Disease, Lung Disease, Seizures, Thyroid Problems, Tuberculosis. [name], [name] ([id number]) [id number] Page 5 of 9 Social History Never smoker, Marital Status - Married, Alcohol Use - Rarely, Drug Use - No History, Caffeine Use - Rarely - coke. Medical History Eyes Denies history of Cataracts, Glaucoma, Optic Neuritis Ear/Nose/Mouth/Throat Denies history of Chronic sinus problems/congestion, Middle ear problems Hematologic/Lymphatic Patient has history of Anemia Denies history of Hemophilia, Human Immunodeficiency Virus, Lymphedema, Sickle Cell Disease Respiratory Patient has history of Sleep Apnea Denies history of Aspiration, Asthma, Chronic Obstructive Pulmonary Disease (COPD), Pneumothorax, Tuberculosis Cardiovascular Denies history of Angina, Arrhythmia, Congestive Heart Failure, Coronary Artery Disease, Deep Vein Thrombosis, Hypertension, Hypotension, Myocardial Infarction, Peripheral Arterial Disease, Peripheral Venous Disease, Phlebitis, Vasculitis Gastrointestinal Denies history of Cirrhosis , Colitis, Crohns, Hepatitis A, Hepatitis B, Hepatitis  C Endocrine Patient has history of Type II Diabetes - 3/24 HgbA1c 12.2 Denies history of Type I Diabetes Genitourinary Denies history of End Stage Renal Disease Immunological Denies history of Lupus Erythematosus, Raynauds, Scleroderma Integumentary (Skin) Denies history of History of Burn Musculoskeletal Patient has history of Gout, Osteoarthritis, Osteomyelitis - fingers Denies history of Rheumatoid Arthritis Neurologic Patient has history of Neuropathy Denies history of Dementia, Quadriplegia, Paraplegia, Seizure Disorder Oncologic Denies history of Received Chemotherapy, Received Radiation Psychiatric Denies history of Anorexia/bulimia, Confinement Anxiety Hospitalization/Surgery History - left ankle reconstruction. - left ankle reconstruction. - left  foot surgery (charcots). - right knee septic. - right knee replacement. - left knee replacement. - 11/05/2022 right ring finger amputation. - 07/02/2022 left middle finger amputation. - 08/03/2020 IandD abscess. - 09/2017 L BKA. - 2/19 right finger amputation. - right ankle reconstruction x3 surgeries 12/23. - left ring finger 01/12/23. Medical A Surgical History Notes nd Respiratory acute bacterial bronchitis 05/16/2022 RSV Gastrointestinal GERD Musculoskeletal Charcot Joint right foot Objective Constitutional Sitting or standing Blood Pressure is within target range for patient.. Pulse regular and within target range for patient.Marland Kitchen Respirations regular, non-labored and within target range.. Temperature is normal and within the target range for the patient.Marland Kitchen Appears in no distress. Vitals Time Taken: 1:25 PM, Height: 64 in, Weight: 261 lbs, BMI: 44.8, Temperature: 98.1 F, Pulse: 81 bpm, Respiratory Rate: 18 breaths/min, Blood Pressure: 109/73 mmHg, Capillary Blood Glucose: 89 mg/dl. General Notes: Wound exam; the patient has a improving wound on the left lateral malleolus we are using Santyl and Hydrofera Blue and border foam  changing every second day the wound looks excellent. Pedal pulses palpable. There is no uncontrolled edema Integumentary (Hair, Skin) Wound #4 status is Open. Original cause of wound was Trauma. The date acquired was: 07/27/2022. The wound has been in treatment 9 weeks. The wound is located on the Right,Lateral Ankle. The wound measures 0.7cm length x 0.9cm width x 0.1cm depth; 0.495cm^2 area and 0.049cm^3 volume. There is Fat Layer (Subcutaneous Tissue) exposed. There is no tunneling or undermining noted. There is a medium amount of serosanguineous drainage noted. The wound margin is distinct with the outline attached to the wound base. There is large (67-100%) pink granulation within the wound bed. There is a small (1-33%) amount of necrotic tissue within the wound bed including Adherent Slough. The periwound skin appearance did not exhibit: Callus, Crepitus, Excoriation, Induration, Rash, Scarring, Dry/Scaly, Maceration, Atrophie Blanche, Cyanosis, Ecchymosis, Hemosiderin Staining, Mottled, Pallor, Rubor, Erythema. Periwound temperature was noted as No Abnormality. 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 There was a Chemical/Enzymatic/Mechanical debridement performed by Shawn Stall, RN.Marland Kitchen Agent used was [employer]. There was no bleeding. The procedure was tolerated well. Post Debridement Measurements: 0.7cm length x 0.9cm width x 0.1cm depth; 0.049cm^3 volume. Character of  Wound/Ulcer Post Debridement is stable. Severity of Tissue Post Debridement is: Fat layer exposed. Post procedure Diagnosis Wound #4: Same as Pre-Procedure Plan Follow-up Appointments: Return Appointment in 2 weeks. - Dr. Leanord Hawking 02/09/2023 1115 Return appointment in 1 month. - Dr. Leanord Hawking -front office to schedule an appt for later in October. Anesthetic: (In clinic) Topical Lidocaine 4% applied to wound bed Bathing/ Shower/ Hygiene: May shower with protection but do not get wound dressing(s) wet. Protect dressing(s) with water repellant cover (for example, large plastic bag) or a cast cover and may then take shower. Edema Control - Lymphedema / SCD / Other: Elevate legs to the level of the heart or above for 30 minutes daily and/or when sitting for 3-4 times a day throughout the day. Avoid standing for long periods of time. Patient to wear own compression stockings every day. WOUND #4: -  Ankle Wound Laterality: Right, Lateral Cleanser: Vashe 5.8 (oz) 1 x Per Day/30 Days Discharge Instructions: Cleanse the wound with Vashe prior to applying a clean dressing using gauze sponges, not tissue or cotton balls. Topical: Santyl Collagenase Ointment, 30 (gm), tube 1 x Per Day/30 Days Prim Dressing: Hydrofera Blue Ready Transfer Foam, 4x5 (in/in) 1 x Per Day/30 Days ary Discharge Instructions: Apply to wound bed as instructed Secondary Dressing: Zetuvit Plus Silicone Border Dressing 4x4 (in/in) (DME) (Generic) 1 x Per Day/30 Days Discharge Instructions: Apply silicone border over primary dressing as directed. Secondary Dressing: tegaderm (DME) (Generic) 1 x Per Day/30 Days 1. Her wound looks as though it is progressing towards closure continue the Santyl Hydrofera Blue based dressings 2. She does not have an arterial or an obvious venous issue. She is careful about pressure relief over the wound bed. Electronic Signature(s) Signed: 01/26/2023 4:26:34 PM By: Baltazar Najjar MD Entered By: Baltazar Najjar on 01/26/2023 10:59:23 -------------------------------------------------------------------------------- HxROS Details Patient Name: Date of Service: [name]. 01/26/2023 1:15 PM Medical Record Number: [id number] Patient Account Number: [id number] Date of Birth/Sex: Treating RN: 27-Jul-1965 (57 y.o. F) Primary Care Provider: Antony Haste Other Clinician: Referring Provider: Treating Provider/Extender: Murtis Sink in Treatment: 9 Eyes Medical History: Negative for: Cataracts; Glaucoma; Optic Neuritis [name], [name] ([id number]) [phone].pdf Page 7 of 9 Ear/Nose/Mouth/Throat Medical History: Negative for: Chronic sinus problems/congestion; Middle ear problems Hematologic/Lymphatic Medical History: Positive for: Anemia Negative for: Hemophilia; Human Immunodeficiency Virus; Lymphedema; Sickle Cell Disease Respiratory Medical History: Positive for: Sleep Apnea Negative for: Aspiration; Asthma; Chronic Obstructive Pulmonary Disease (COPD); Pneumothorax; Tuberculosis Past Medical History Notes: acute bacterial bronchitis 05/16/2022 RSV Cardiovascular Medical History: Negative for: Angina; Arrhythmia; Congestive Heart Failure; Coronary Artery Disease; Deep Vein Thrombosis; Hypertension; Hypotension; Myocardial Infarction; Peripheral Arterial Disease; Peripheral Venous Disease; Phlebitis; Vasculitis Gastrointestinal Medical History: Negative for: Cirrhosis ; Colitis; Crohns; Hepatitis A; Hepatitis B; Hepatitis C Past Medical History Notes: GERD Endocrine Medical History: Positive for: Type II Diabetes - 3/24 HgbA1c 12.2 Negative for: Type I Diabetes Time with diabetes: 1 year ago Treated with: Insulin, Diet Blood sugar tested every day: Yes Tested : x3 a day Genitourinary Medical History: Negative for: End Stage Renal Disease Immunological Medical History: Negative for: Lupus Erythematosus; Raynauds;  Scleroderma Integumentary (Skin) Medical History: Negative for: History of Burn Musculoskeletal Medical History: Positive for: Gout; Osteoarthritis; Osteomyelitis - fingers Negative for: Rheumatoid Arthritis Past Medical History Notes: Charcot Joint right foot Neurologic Medical History: Positive for: Neuropathy Negative for: Dementia; Quadriplegia; Paraplegia; Seizure Disorder Oncologic Medical History: Negative for: Received Chemotherapy; Received Radiation Psychiatric Medical History: Negative for: [name], [name] ([id number]) [id number] Page 8 of 9 Immunizations Pneumococcal Vaccine: Received Pneumococcal Vaccination: No Immunization Notes: last tetanus shot 6 years ago Implantable Devices No devices added Hospitalization / Surgery History Type of Hospitalization/Surgery left ankle reconstruction left ankle reconstruction left foot surgery (charcots) right knee septic right knee replacement left knee replacement 11/05/2022 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/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 IandD abscess 09/2017 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 right ankle reconstruction x3 surgeries 12/23 left ring finger 01/12/23 Family and Social History Cancer: Yes - Mother; Diabetes: Yes - Mother; Heart Disease: No; Hereditary Spherocytosis: No; Hypertension: No; Kidney Disease: No; Lung Disease: No; Seizures: No; Stroke: Yes - Father; Thyroid Problems: No; Tuberculosis: No; Never smoker; Marital Status - Married; Alcohol Use: Rarely; Drug Use: No History; Caffeine Use: Rarely - coke; Financial Concerns: No; Food, Clothing  or Shelter Needs: No; Support System Lacking: No; Transportation Concerns: No Electronic Signature(s) Signed: 01/26/2023 4:26:34 PM By: Baltazar Najjar MD Signed: 01/28/2023 3:10:19 PM By: Karie Schwalbe RN Signed: 01/28/2023 3:52:59 PM By: Thayer Dallas Entered By: Thayer Dallas  on 01/26/2023 10:19:57 -------------------------------------------------------------------------------- SuperBill Details Patient Name: Date of Service: [name]. 01/26/2023 Medical Record Number: [id number] Patient Account Number: [id number] Date of Birth/Sex: Treating RN: December 17, 1965 (57 y.o. 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, initial encounter Facility Procedures : CPT4 Code: 42595638 Description: 75643 - DEBRIDE W/O ANES NON SELECT Modifier: Quantity: 1 Physician Procedures : CPT4 Code Description Modifier [id number] - WC PHYS LEVEL 3 - EST PT [name], [name] ([id number]) [id number] ICD-10 Diagnosis Description L97.518 Non-pressure chronic ulcer of other part of right foot with other specified  severity E11.621 Type 2 diabetes mellitus with foot ulcer Quantity: 1 1227.pdf Page 9 of 9 Electronic Signature(s) Signed: 01/26/2023 4:26:34 PM By: Baltazar Najjar MD Entered By: Baltazar Najjar on 01/26/2023 10:59:47

## 2023-02-09 ENCOUNTER — Encounter (HOSPITAL_BASED_OUTPATIENT_CLINIC_OR_DEPARTMENT_OTHER): Payer: Managed Care, Other (non HMO) | Admitting: Internal Medicine

## 2023-02-10 ENCOUNTER — Ambulatory Visit: Payer: Managed Care, Other (non HMO) | Admitting: Infectious Diseases

## 2023-02-10 ENCOUNTER — Encounter: Payer: Self-pay | Admitting: Infectious Diseases

## 2023-02-10 VITALS — BP 156/92 | HR 85 | Temp 98.2°F | Ht 64.0 in | Wt 275.0 lb

## 2023-02-10 DIAGNOSIS — B999 Unspecified infectious disease: Secondary | ICD-10-CM | POA: Diagnosis not present

## 2023-02-10 MED ORDER — SULFAMETHOXAZOLE-TRIMETHOPRIM 800-160 MG PO TABS
1.0000 | ORAL_TABLET | Freq: Every day | ORAL | 3 refills | Status: DC
Start: 2023-02-10 — End: 2023-11-25

## 2023-02-10 MED ORDER — MUPIROCIN 2 % EX OINT
1.0000 | TOPICAL_OINTMENT | Freq: Two times a day (BID) | CUTANEOUS | 3 refills | Status: AC
Start: 2023-02-10 — End: 2023-03-26

## 2023-02-10 NOTE — Progress Notes (Signed)
Subjective:    Patient ID: [name], female  DOB: Aug 05, 1965, 57 y.o.        MRN: [id number]   HPI 57 yo F with DM2, obesity, left total knee replacement 2007, right total knee replacement 2012. She came to [facility] on January 30, 2013. She underwent irrigation and debridement of her right total knee replacement with exchange of parts. Cipro was added to her antibiotic regimen on October 8. She was found to have group B strep in her joint culture from her outpatient aspiration. On October 9 her antibiotics were changed to ceftriaxone 2 g once daily and she was discharged home. 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    Was planned to stop her anbx at 1 year (Oct 2015) however at ID f/u on 11-17 was continued on anbx through her foot surgery.    After L foot surgery 01-25-15 (5 bones removed, rod from great toe to ankle), she had fevers. She had 2nd anbx added. She had repeat aspirate of her L knee that was clear. She had Cx sent from her foot that was (-).   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    She had further repair of her ankle on July 10, 2016.    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     She was seen in ID on 10-19-16 and was noted to have continued issues with wound. Her anbx were extended to 12-08-16.     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 She was treated anbx, mupirocin.    07-2017, she had L BKA and TMR 09-13-17 at [facility]. She had MSSA bacteremia at that time, was treated with nfcillin --> cefazolin. Her TTE was (-). She completed her anbx on 10-19-17.    In hospital 10-28 to 02-23-18 with R index finger osteo that failed outpt anbx (keflex). She got better initially but then worsened. She was d/c home with bactrim for 10 days.    She was seen 05-2018 after she removed the skin from her distal L 3rd digit.   She has been seen by rheum at [facility] for OA.      She was in [facility] 04-07-19 with I & D of R foot, She was d/c home on vanco/invanz to end on 05-19-19. Her Cx was mixed G+.  Her course was complicated by Cr 1.29 and Vanco Tr 27.3 on 04-26-19.   She had reconstructive surgery of her R foot 07-04-19. She was continued on keflex through her surgery.  She was seen in surgery 08-04-19 and had sutures out, fiberglass cast applied.  She is having f/u at [facility] today. She is awaiting f/u xrays.  Her wounds are closed (1 small scab is all that remains).   Last A1C was 6.0% per pt.  Had optho 2020. Knows she is due, but can't get in with a wheelchair.   She was adm 9-19 and then 9-30 with L 3rd digit osteo. Her second adm was due to wound non-healing and persistent osteo. She had further debridement/amputation with second surgery.  Her Cx grew MRSA (R- Tet, Emycin, Clinda, Ox).  Sugars have been good.  On lots of diabetic medications after 3 fingers needing surgery this year.  Doesn't eat a lot of carbs, does eat lots  of fresh veg and fruits, meat/poultry/fish.   She just completed a course of bactrim/keflex yesterday.   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    Health Maintenance  Topic Date Due   FOOT EXAM  Never done   OPHTHALMOLOGY EXAM  Never done   Diabetic kidney evaluation - Urine ACR  Never done   Hepatitis C Screening  Never done   Cervical Cancer Screening (HPV/Pap Cotest)  Never done   Colonoscopy  Never done   MAMMOGRAM  Never done   Zoster Vaccines- Shingrix (1 of 2) Never done   COVID-19 Vaccine (3 - 2023-24 season) 12/27/2022   HEMOGLOBIN A1C  01/02/2023   INFLUENZA VACCINE  07/26/2023 (Originally 11/26/2022)   Diabetic kidney evaluation - eGFR measurement  01/14/2024   DTaP/Tdap/Td (3 - Td or Tdap) 02/22/2028   HIV Screening  Completed   HPV VACCINES  Aged Out      Review of Systems  Constitutional:  Negative for chills, fever and weight loss.  Eyes:   Negative for blurred vision.  Respiratory:  Negative for cough and shortness of breath.   Gastrointestinal:  Negative for constipation and diarrhea.  Genitourinary:  Negative for dysuria.  Neurological:  Positive for sensory change.    Please see HPI. All other systems reviewed and negative.     Objective:  Physical Exam Vitals reviewed.  Constitutional:      Appearance: Normal appearance. She is obese.  HENT:     Mouth/Throat:     Mouth: Mucous membranes are moist.  Eyes:     Extraocular Movements: Extraocular movements intact.     Pupils: Pupils are equal, round, and reactive to light.  Cardiovascular:     Rate and Rhythm: Normal rate and regular rhythm.     Heart sounds: Murmur heard.  Pulmonary:     Effort: Pulmonary effort is normal.     Breath sounds: Normal breath sounds.  Abdominal:     General: Bowel sounds are normal. There is no distension.     Palpations: Abdomen is soft.     Tenderness: There is no abdominal tenderness.  Musculoskeletal:        General: Swelling present.       Arms:     Cervical back: Normal range of motion and neck supple.     Right lower leg: Edema present.       Legs:     Comments: Wound wrapped. No proximal erythema or streaking.  Multiple wounds on finger tips. Please see photos. No d/c from wounds.   Neurological:     Mental Status: She is alert.          Assessment & Plan:

## 2023-02-10 NOTE — Assessment & Plan Note (Signed)
Will work on decolonizing her and preventing worsening of her infections.  Will give her low dose bactrim chronically. She will continue her weekly CHG baths Will add mupirocin for the first 3 days of the month, every month.  Rtc in 1 month.  Doesn't want flu shot, has gotten COVID vax.

## 2023-02-16 ENCOUNTER — Encounter (HOSPITAL_BASED_OUTPATIENT_CLINIC_OR_DEPARTMENT_OTHER): Payer: Managed Care, Other (non HMO) | Admitting: Internal Medicine

## 2023-02-16 DIAGNOSIS — E11621 Type 2 diabetes mellitus with foot ulcer: Secondary | ICD-10-CM | POA: Diagnosis not present

## 2023-02-19 ENCOUNTER — Encounter (HOSPITAL_BASED_OUTPATIENT_CLINIC_OR_DEPARTMENT_OTHER): Payer: Managed Care, Other (non HMO) | Admitting: General Surgery

## 2023-02-19 DIAGNOSIS — E11621 Type 2 diabetes mellitus with foot ulcer: Secondary | ICD-10-CM | POA: Diagnosis not present

## 2023-02-19 NOTE — Progress Notes (Signed)
DANEISHA, WAMBACH R ([id number]) [id number] Page 1 of 3 Visit Report for 02/19/2023 Arrival Information Details Patient Name: Date of Service: [name] Draft NDRA R. 02/19/2023 8:00 A M Medical Record Number: [id number] Patient Account Number: [id number] Date of Birth/Sex: Treating RN: 1965/09/25 (57 y.o. [name], Millard.Loa Primary Care Ravleen Ries: Antony Haste Other Clinician: Referring Ronita Hargreaves: Treating Tahjanae Blankenburg/Extender: Molli Knock in Treatment: 12 Visit Information History Since Last Visit Added or deleted any medications: No Patient Arrived: Ambulatory Any new allergies or adverse reactions: No Arrival Time: 08:30 Had a fall or experienced change in No Accompanied By: self activities of daily living that may affect Transfer Assistance: None risk of falls: Patient Identification Verified: Yes Signs or symptoms of abuse/neglect since last visito No Secondary Verification Process Completed: Yes Hospitalized since last visit: No Patient Requires Transmission-Based Precautions: No Implantable device outside of the clinic excluding No Patient Has Alerts: No cellular tissue based products placed in the center since last visit: Has Dressing in Place as Prescribed: Yes Pain Present Now: No Electronic Signature(s) Signed: 02/19/2023 2:17:02 PM By: Shawn Stall RN, BSN Entered By: Shawn Stall on 02/19/2023 05:45:10 -------------------------------------------------------------------------------- Encounter Discharge Information Details Patient Name: Date of Service: [name] Draft NDRA R. 02/19/2023 8:00 A M Medical Record Number: [id number] Patient Account Number: [id number] Date of Birth/Sex: Treating RN: [birth year] (57 y.o. [name] Primary Care Sarha Bartelt: Antony Haste Other Clinician: Referring Whitnie Deleon: Treating Percell Lamboy/Extender: Molli Knock in Treatment: 12 Encounter Discharge  Information Items Post Procedure Vitals Discharge Condition: Stable Unable to obtain vitals Reason: did not obtain- nurse visit with santyl. Ambulatory Status: Ambulatory Discharge Destination: Home Transportation: Private Auto Accompanied By: self Schedule Follow-up Appointment: Yes Clinical Summary of Care: Electronic Signature(s) Signed: 02/19/2023 2:17:02 PM By: Shawn Stall RN, BSN Entered By: Shawn Stall on 02/19/2023 05:47:35 Boch, Garald Braver ([id number]) [id number] Page 2 of 3 -------------------------------------------------------------------------------- Wound Assessment Details Patient Name: Date of Service: [name] Draft NDRA R. 02/19/2023 8:00 A M Medical Record Number: [id number] Patient Account Number: [id number] Date of Birth/Sex: Treating RN: Mar 15, 1966 (57 y.o. [name], Millard.Loa Primary Care Keaghan Bowens: Antony Haste Other Clinician: Referring Barett Whidbee: Treating Chantal Worthey/Extender: Molli Knock in Treatment: 12 Wound Status Wound Number: 4 Primary Diabetic Wound/Ulcer of the Lower Extremity Etiology: Wound Location: Right, Lateral Ankle Wound Open Wounding Event: Trauma Status: Date Acquired: 07/27/2022 Comorbid Anemia, Sleep Apnea, Type II Diabetes, Gout, Osteoarthritis, Weeks Of Treatment: 12 History: Osteomyelitis, Neuropathy Clustered Wound: No Pending Amputation On Presentation Wound Measurements Length: (cm) 1.8 Width: (cm) 1.8 Depth: (cm) 0.1 Area: (cm) 2.545 Volume: (cm) 0.254 % Reduction in Area: 11.9% % Reduction in Volume: 56.1% Epithelialization: Small (1-33%) Tunneling: No Undermining: No Wound Description Classification: Grade 1 Wound Margin: Distinct, outline attached Exudate Amount: Medium Exudate Type: Serosanguineous Exudate Color: red, brown Foul Odor After Cleansing: No Slough/Fibrino Yes Wound Bed Granulation Amount: Small (1-33%) Exposed Structure Granulation Quality:  Red, Pink Fascia Exposed: No Necrotic Amount: Large (67-100%) Fat Layer (Subcutaneous Tissue) Exposed: Yes Necrotic Quality: Adherent Slough Tendon Exposed: No Muscle Exposed: No Joint Exposed: No Bone Exposed: No Periwound Skin Texture Texture Color No Abnormalities Noted: No No Abnormalities Noted: No Callus: No Atrophie Blanche: No Crepitus: No Cyanosis: No Excoriation: No Ecchymosis: No Induration: No Erythema: Yes Rash: No Erythema Location: Circumferential Scarring: No Erythema Measurement: Marked Hemosiderin Staining: No Moisture Mottled: No No Abnormalities Noted: No Pallor: No Dry / Scaly: No Rubor: No Maceration: No Temperature / Pain Temperature:  No Abnormality Treatment Notes Wound #4 (Ankle) Wound Laterality: Right, Lateral Cleanser Vashe 5.8 (oz) Discharge Instruction: Cleanse the wound with Vashe prior to applying a clean dressing using gauze sponges, not tissue or cotton balls. Peri-Wound Care Topical Bonczek, Dannisha R ([id number]) [phone].pdf Page 3 of 3 Santyl Collagenase Ointment, 30 (gm), tube Primary Dressing Hydrofera Blue Ready Transfer Foam, 4x5 (in/in) Discharge Instruction: Apply to wound bed as instructed Secondary Dressing Zetuvit Plus Silicone Border Dressing 4x4 (in/in) Discharge Instruction: Apply silicone border over primary dressing as directed. tegaderm Secured With Compression Wrap Compression Stockings Facilities manager) Signed: 02/19/2023 2:17:02 PM By: Shawn Stall RN, BSN Entered By: Shawn Stall on 02/19/2023 05:45:35

## 2023-02-19 NOTE — Progress Notes (Signed)
HAITI, FLINN R ([id number]) [id number] Page 1 of 2 Visit Report for 02/19/2023 Debridement Details Patient Name: Date of Service: [name] Draft NDRA R. 02/19/2023 8:00 A M Medical Record Number: [id number] Patient Account Number: [id number] Date of Birth/Sex: Treating RN: March 20, 1966 (57 y.o. [name] Primary Care Provider: Antony Haste Other Clinician: Referring Provider: Treating Provider/Extender: Molli Knock in Treatment: 12 Debridement Performed for Assessment: Wound #4 Right,Lateral Ankle Performed By: Clinician Shawn Stall, RN Debridement Type: Chemical/Enzymatic/Mechanical Agent Used: Santyl Severity of Tissue Pre Debridement: Fat layer exposed Level of Consciousness (Pre-procedure): Awake and Alert Pre-procedure Verification/Time Out No Taken: Percent of Wound Bed Debrided: Bleeding: None Response to Treatment: Procedure was tolerated well Level of Consciousness (Post- Awake and Alert procedure): Post Debridement Measurements of Total Wound Length: (cm) 1.8 Width: (cm) 1.8 Depth: (cm) 0.1 Volume: (cm) 0.254 Character of Wound/Ulcer Post Debridement: Requires Further Debridement Severity of Tissue Post Debridement: Fat layer exposed Electronic Signature(s) Signed: 02/19/2023 9:41:50 AM By: Duanne Guess MD FACS Signed: 02/19/2023 2:17:02 PM By: Shawn Stall RN, BSN Entered By: Shawn Stall on 02/19/2023 05:46:29 -------------------------------------------------------------------------------- SuperBill Details Patient Name: Date of Service: [name] Draft NDRA R. 02/19/2023 Medical Record Number: [id number] Patient Account Number: [id number] Date of Birth/Sex: Treating RN: 13-Dec-1965 (57 y.o. 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

## 2023-02-22 ENCOUNTER — Ambulatory Visit (HOSPITAL_BASED_OUTPATIENT_CLINIC_OR_DEPARTMENT_OTHER): Payer: Commercial Managed Care - HMO | Admitting: Internal Medicine

## 2023-02-23 ENCOUNTER — Ambulatory Visit (HOSPITAL_BASED_OUTPATIENT_CLINIC_OR_DEPARTMENT_OTHER): Payer: Commercial Managed Care - HMO | Admitting: Internal Medicine

## 2023-03-02 ENCOUNTER — Encounter (HOSPITAL_BASED_OUTPATIENT_CLINIC_OR_DEPARTMENT_OTHER): Payer: Commercial Managed Care - HMO | Attending: Internal Medicine | Admitting: Internal Medicine

## 2023-03-02 DIAGNOSIS — G4730 Sleep apnea, unspecified: Secondary | ICD-10-CM | POA: Insufficient documentation

## 2023-03-02 DIAGNOSIS — E11621 Type 2 diabetes mellitus with foot ulcer: Secondary | ICD-10-CM | POA: Insufficient documentation

## 2023-03-02 DIAGNOSIS — E1151 Type 2 diabetes mellitus with diabetic peripheral angiopathy without gangrene: Secondary | ICD-10-CM | POA: Diagnosis not present

## 2023-03-02 DIAGNOSIS — L97518 Non-pressure chronic ulcer of other part of right foot with other specified severity: Secondary | ICD-10-CM | POA: Diagnosis not present

## 2023-03-02 DIAGNOSIS — E1140 Type 2 diabetes mellitus with diabetic neuropathy, unspecified: Secondary | ICD-10-CM | POA: Insufficient documentation

## 2023-03-02 DIAGNOSIS — D649 Anemia, unspecified: Secondary | ICD-10-CM | POA: Insufficient documentation

## 2023-03-02 DIAGNOSIS — M1990 Unspecified osteoarthritis, unspecified site: Secondary | ICD-10-CM | POA: Diagnosis not present

## 2023-03-03 NOTE — Progress Notes (Signed)
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 8 (in/in) (DME) (Generic) 1 x Per Day/30 Days WOUND #5: - Hand - 5th Digit Wound Laterality: Plantar Cleanser: Vashe 5.8 (oz) 1 x Per Day/30 Days Discharge Instructions: Cleanse the wound with Vashe prior to applying a clean dressing using gauze sponges, not tissue or cotton balls. Topical: Santyl Collagenase Ointment, 30 (gm), tube 1 x Per Day/30 Days Prim Dressing: Hydrofera Blue Ready Transfer Foam, 2.5x2.5 (in/in) (DME) (Generic) 1 x Per Day/30 Days ary Discharge Instructions: Apply directly to wound bed as directed Secondary Dressing: Woven Gauze Sponges 2x2 in (DME) (Generic) 1 x Per Day/30 Days Discharge Instructions: Apply over primary dressing as directed. Secured With: 32M Medipore H Soft Cloth Surgical T ape, 4 x 10 (in/yd) (DME) (Generic) 1 x Per Day/30 Days Discharge Instructions: Secure with tape as directed. 1. The right lateral ankle wound looks better this week clean surface. She seems to have benefited from the linezolid the degree of erythema  has resolved. Still going to use Hydrofera Blue #2 palmar right thumb. This required debridement. Fortunately I do not see any evidence of infection here. We are going to use the Santyl and [facility] she has on hand in this area as well. This does not probe deeply. 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 03/02/2023 Medical Record Number: [id number] Patient Account Number: [id number] Date of Birth/Sex: Treating RN: 1966/02/15 (57 y.o. 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 traumatic transphalangeal amputation of right ring finger, initial encounter Facility Procedures : CPT4 Code: 35009381 Description: 99213 - WOUND CARE VISIT-LEV 3 EST PT Modifier: Quantity: 1 : CPT4 Code: 82993716 Description: 97602 - DEBRIDE W/O ANES NON SELECT Modifier: 59 Quantity: 1 : CPT4 Code: 96789381 Description: 16020 - BURN DRSG W/O ANESTH-SM ICD-10 Diagnosis Description O17.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

## 2023-03-05 NOTE — Progress Notes (Signed)
[name], Park Park ([id number]) [id number] Page 1 of 11 Visit Report for 03/02/2023 Arrival Information Details Patient Name: Date of Service: [name] Draft NDRA Park. 03/02/2023 3:30 PM Medical Record Number: [id number] Patient Account Number: [id number] Date of Birth/Sex: Treating RN: [birth year] (57 y.o. F) Primary Care [name]: [name] Other Clinician: Referring [name]: Treating [name]/Extender: [name] in Treatment: 14 Visit Information History Since Last Visit Added or deleted any medications: No Patient Arrived: Ambulatory Any new allergies or adverse reactions: No Arrival Time: 15:42 Had a fall or experienced change in No Accompanied By: self activities of daily living that may affect Transfer Assistance: None risk of falls: Patient Identification Verified: Yes Signs or symptoms of abuse/neglect since last visito No Secondary Verification Process Completed: Yes Hospitalized since last visit: No Patient Requires Transmission-Based Precautions: No Implantable device outside of the clinic excluding No Patient Has Alerts: No cellular tissue based products placed in the center since last visit: Has Dressing in Place as Prescribed: Yes Pain Present Now: Yes Electronic Signature(s) Signed: 03/05/2023 12:52:25 PM By: Thayer Dallas Entered By: Thayer Dallas on 03/02/2023 15:44:44 -------------------------------------------------------------------------------- Clinic Level of Care Assessment Details Patient Name: Date of Service: [name] NDRA Park. 03/02/2023 3:30 PM Medical Record Number: [id number] Patient Account Number: [id number] Date of Birth/Sex: Treating RN: 03/21/1966 (57 y.o. [name], Millard.Loa Primary Care [name]: [name] Other Clinician: Referring [name]: Treating [name]/Extender: [name] in Treatment: 14 Clinic Level of Care Assessment Items TOOL 3  Quantity Score X- 1 0 Use when EandM and Procedure is performed on FOLLOW-UP visit ASSESSMENTS - Nursing Assessment / Reassessment X- 1 10 Reassessment of Co-morbidities (includes updates in patient status) X- 1 5 Reassessment of Adherence to Treatment Plan ASSESSMENTS - Wound and Skin Assessment / Reassessment []  - Points for Wound Assessment can only be taken for a new wound of unknown or different etiology and a procedure is 0 NOT performed to that wound X- 1 5 Simple Wound Assessment / Reassessment - one wound []  - 0 Complex Wound Assessment / Reassessment - multiple wounds []  - 0 Dermatologic / Skin Assessment (not related to wound area) ASSESSMENTS - Focused Assessment []  - 0 Circumferential Edema Measurements - multi extremities [name], [name] ([id number]) [id number] Page 2 of 11 []  - 0 Nutritional Assessment / Counseling / Intervention []  - 0 Lower Extremity Assessment (monofilament, tuning fork, pulses) []  - 0 Peripheral Arterial Disease Assessment (using hand held doppler) ASSESSMENTS - Ostomy and/or Continence Assessment and Care []  - 0 Incontinence Assessment and Management []  - 0 Ostomy Care Assessment and Management (repouching, etc.) PROCESS - Coordination of Care []  - Points for Discharge Coordination can only be taken for a new wound of unknown or different etiology and a procedure 0 is NOT performed to that wound X- 1 15 Simple Patient / Family Education for ongoing care X- 1 20 Complex (extensive) Patient / Family Education for ongoing care []  - 0 Staff obtains Chiropractor, Records, T Results / Process Orders est []  - 0 Staff telephones HHA, Nursing Homes / Clarify orders / etc []  - 0 Routine Transfer to another Facility (non-emergent condition) []  - 0 Routine Hospital Admission (non-emergent condition) []  - 0 New Admissions / Manufacturing engineer / Ordering NPWT Apligraf, etc. , []  - 0 Emergency Hospital  Admission (emergent condition) X- 1 10 Simple Discharge Coordination []  - 0 Complex (extensive) Discharge Coordination PROCESS - Special Needs []  - 0 Pediatric / [place]ran Patient Management []  -  0 Isolation Patient Management []  - 0 Hearing / Language / Visual special needs []  - 0 Assessment of Community assistance (transportation, D/C planning, etc.) []  - 0 Additional assistance / Altered mentation []  - 0 Support Surface(s) Assessment (bed, cushion, seat, etc.) INTERVENTIONS - Wound Cleansing / Measurement []  - Points for Wound Cleaning / Measurement, Wound Dressing, Specimen Collection and Specimen taken to lab can only 0 be taken for a new wound of unknown or different etiology and a procedure is NOT performed to that wound X- 1 5 Simple Wound Cleansing - one wound []  - 0 Complex Wound Cleansing - multiple wounds X- 1 5 Wound Imaging (photographs - any number of wounds) []  - 0 Wound Tracing (instead of photographs) X- 1 5 Simple Wound Measurement - one wound []  - 0 Complex Wound Measurement - multiple wounds INTERVENTIONS - Wound Dressings X - Small Wound Dressing one or multiple wounds 1 10 []  - 0 Medium Wound Dressing one or multiple wounds []  - 0 Large Wound Dressing one or multiple wounds INTERVENTIONS - Miscellaneous []  - 0 External ear exam []  - 0 Specimen Collection (cultures, biopsies, blood, body fluids, etc.) []  - 0 Specimen(s) / Culture(s) sent or taken to Lab for analysis []  - 0 Patient Transfer (multiple staff / Michiel Sites Lift / Similar devices) []  - 0 Simple Staple / Suture removal (25 or less) [name], [name] ([id number]) [id number] Page 3 of 11 []  - 0 Complex Staple / Suture removal (26 or more) []  - 0 Hypo / Hyperglycemic Management (close monitor of Blood Glucose) []  - 0 Ankle / Brachial Index (ABI) - do not check if billed separately X- 1 5 Vital Signs Has the patient been seen at the hospital within the last  three years: Yes Total Score: 95 Level Of Care: New/Established - Level 3 Electronic Signature(s) Signed: 03/02/2023 4:47:30 PM By: Shawn Stall RN, BSN Entered By: Shawn Stall on 03/02/2023 16:14:06 -------------------------------------------------------------------------------- Encounter Discharge Information Details Patient Name: Date of Service: [name] Draft NDRA Park. 03/02/2023 3:30 PM Medical Record Number: [id number] Patient Account Number: [id number] Date of Birth/Sex: Treating RN: Sep 13, 1965 (57 y.o. [name] Primary Care Gadge Hermiz: [name] Other Clinician: Referring Lealand Elting: Treating Marikay Roads/Extender: [name] in Treatment: 14 Encounter Discharge Information Items Post Procedure Vitals Discharge Condition: Stable Temperature (F): 98.4 Ambulatory Status: Ambulatory Pulse (bpm): 93 Discharge Destination: Home Respiratory Rate (breaths/min): 20 Transportation: Private Auto Blood Pressure (mmHg): 123/77 Accompanied By: self Schedule Follow-up Appointment: Yes Clinical Summary of Care: Electronic Signature(s) Signed: 03/02/2023 4:47:30 PM By: Shawn Stall RN, BSN Entered By: Shawn Stall on 03/02/2023 16:19:12 -------------------------------------------------------------------------------- Lower Extremity Assessment Details Patient Name: Date of Service: [name] NDRA Park. 03/02/2023 3:30 PM Medical Record Number: [id number] Patient Account Number: [id number] Date of Birth/Sex: Treating RN: 03-31-1966 (57 y.o. F) Primary Care Jemimah Cressy: [name] Other Clinician: Referring Raesean Bartoletti: Treating Danyale Ridinger/Extender: [name] in Treatment: 14 Edema Assessment Assessed: [name]: No] [name]: No] Edema: [Left: Ye] [Right: s] Calf Left: Right: Point of Measurement: 34.5 cm From Medial Instep 41.5 cm Ankle Left: Right: Point of Measurement: 12.5 cm From Medial Instep 25.5 cm Silvey, [name]  ([id number]) [id number] Page 4 of 11 Vascular Assessment Extremity colors, hair growth, and conditions: Extremity Color: [Right:Normal] Hair Growth on Extremity: [Right:No] Temperature of Extremity: [Right:Warm] Capillary Refill: [Right:< 3 seconds] Dependent Rubor: [Right:No No] Electronic Signature(s) Signed: 03/05/2023 12:52:25 PM By: Thayer Dallas Entered By: Thayer Dallas on 03/02/2023 15:51:17 -------------------------------------------------------------------------------- Multi  Wound Chart Details Patient Name: Date of Service: [name] Draft NDRA Park. 03/02/2023 3:30 PM Medical Record Number: [id number] Patient Account Number: [id number] Date of Birth/Sex: Treating RN: 1966/04/15 (57 y.o. F) Primary Care Faydra Korman: [name] Other Clinician: Referring Nyriah Coote: Treating Lorena Benham/Extender: [name] in Treatment: 14 Vital Signs Height(in): 64 Pulse(bpm): 93 Weight(lbs): 261 Blood Pressure(mmHg): 123/77 Body Mass Index(BMI): 44.8 Temperature(F): 98.4 Respiratory Rate(breaths/min): 18 [4:Photos:] [N/A:N/A] Right, Lateral Ankle Plantar Hand - 5th Digit N/A Wound Location: Trauma Thermal Burn N/A Wounding Event: Diabetic Wound/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 Burn N/A Primary Etiology: Extremity Anemia, Sleep Apnea, Type II Anemia, Sleep Apnea, Type II N/A Comorbid History: Diabetes, Gout, Osteoarthritis, Diabetes, Gout, Osteoarthritis, Osteomyelitis, Neuropathy Osteomyelitis, Neuropathy 07/27/2022 12/28/2022 N/A Date Acquired: 14 0 N/A Weeks of Treatment: Open Open N/A Wound Status: No No N/A Wound Recurrence: Yes No N/A Pending A mputation on Presentation: 1.5x1.4x0.1 1.7x1.5x0.1 N/A Measurements L x W x D (cm) 1.649 2.003 N/A A (cm) : rea 0.165 0.2 N/A Volume (cm) : 42.90% N/A N/A % Reduction in A rea: 71.50% N/A N/A % Reduction in Volume: Grade 1 Unclassifiable N/A Classification: Medium Medium  N/A Exudate A mount: Serosanguineous Serosanguineous N/A Exudate Type: red, brown red, brown N/A Exudate Color: Distinct, outline attached Distinct, outline attached N/A Wound Margin: Small (1-33%) None Present (0%) N/A Granulation A mount: Red, Pink N/A N/A Granulation Quality: Large (67-100%) Large (67-100%) N/A Necrotic A mount: Fat Layer (Subcutaneous Tissue): Yes Fat Layer (Subcutaneous Tissue): Yes N/A Exposed Structures: Dalziel, Valerie Park ([id number]) [id number] Page 5 of 11 Fascia: No Fascia: No Tendon: No Tendon: No Muscle: No Muscle: No Joint: No Joint: No Bone: No Bone: No Small (1-33%) None N/A Epithelialization: Chemical/Enzymatic/Mechanical N/A N/A Debridement: N/A N/A N/A Instrument: None N/A N/A Bleeding: Debridement Treatment Response: Procedure was tolerated well N/A N/A Post Debridement Measurements L x 1.5x1.4x0.1 N/A N/A W x D (cm) 0.165 N/A N/A Post Debridement Volume: (cm) Excoriation: No Excoriation: No N/A Periwound Skin Texture: Induration: No Induration: No Callus: No Callus: No Crepitus: No Crepitus: No Rash: No Rash: No Scarring: No Scarring: No Maceration: No Maceration: No N/A Periwound Skin Moisture: Dry/Scaly: No Dry/Scaly: No Erythema: Yes Atrophie Blanche: No N/A Periwound Skin Color: Atrophie Blanche: No Cyanosis: No Cyanosis: No Ecchymosis: No Ecchymosis: No Erythema: No Hemosiderin Staining: No Hemosiderin Staining: No Mottled: No Mottled: No Pallor: No Pallor: No Rubor: No Rubor: No Circumferential N/A N/A Erythema Location: Marked N/A N/A Erythema Measurement: No Abnormality N/A N/A Temperature: Debridement Dressings and/or debridement of N/A Procedures Performed: burns; small Treatment Notes Electronic Signature(s) Signed: 03/02/2023 4:32:14 PM By: Baltazar Najjar MD Entered By: Baltazar Najjar on 03/02/2023  16:18:45 -------------------------------------------------------------------------------- Multi-Disciplinary Care Plan Details Patient Name: Date of Service: [name] Draft NDRA Park. 03/02/2023 3:30 PM Medical Record Number: [id number] Patient Account Number: [id number] Date of Birth/Sex: Treating RN: Jan 16, 1966 (58 y.o. [name] Primary Care Darrius Montano: [name] Other Clinician: Referring Rudy Domek: Treating Jahmere Bramel/Extender: [name] in Treatment: 14 Active Inactive Necrotic Tissue Nursing Diagnoses: Knowledge deficit related to management of necrotic/devitalized tissue Goals: Necrotic/devitalized tissue will be minimized in the wound bed Date Initiated: 11/23/2022 Target Resolution Date: 03/19/2023 Goal Status: Active Patient/caregiver will verbalize understanding of reason and process for debridement of necrotic tissue Date Initiated: 11/23/2022 Target Resolution Date: 03/19/2023 Goal Status: Active Interventions: Assess patient pain level pre-, during and post procedure and prior to discharge Provide education on necrotic tissue and debridement process Treatment Activities:  [name], [name] ([id number]) [id number] Page 6 of 11 Biologic debridement : 11/23/2022 Excisional debridement : 11/23/2022 Notes: Nutrition Nursing Diagnoses: Potential for alteratiion in Nutrition/Potential for imbalanced nutrition Goals: Patient/caregiver agrees to and verbalizes understanding of need to obtain nutritional consultation Date Initiated: 11/23/2022 Date Inactivated: 12/22/2022 Target Resolution Date: 12/25/2022 Goal Status: Met Patient/caregiver will maintain therapeutic glucose control Date Initiated: 11/23/2022 Target Resolution Date: 03/19/2023 Goal Status: Active Interventions: Assess HgA1c results as ordered upon admission and as needed Provide education on elevated blood sugars and impact on wound healing Provide  education on nutrition Treatment Activities: Obtain HgA1c : 11/23/2022 Patient referred to Primary Care Physician for further nutritional evaluation : 11/23/2022 Notes: Pain, Acute or Chronic Nursing Diagnoses: Potential alteration in comfort, pain Goals: Patient will verbalize adequate pain control and receive pain control interventions during procedures as needed Date Initiated: 11/23/2022 Target Resolution Date: 03/19/2023 Goal Status: Active Patient/caregiver will verbalize comfort level met Date Initiated: 11/23/2022 Target Resolution Date: 03/19/2023 Goal Status: Active Interventions: Encourage patient to take pain medications as prescribed Provide education on pain management Reposition patient for comfort Treatment Activities: Administer pain control measures as ordered : 11/23/2022 Notes: Wound/Skin Impairment Nursing Diagnoses: Knowledge deficit related to ulceration/compromised skin integrity Goals: Ulcer/skin breakdown will heal within 14 weeks Date Initiated: 11/23/2022 Target Resolution Date: 04/23/2023 Goal Status: Active Interventions: Assess patient/caregiver ability to perform ulcer/skin care regimen upon admission and as needed Assess ulceration(s) every visit Provide education on ulcer and skin care Treatment Activities: Skin care regimen initiated : 11/23/2022 Topical wound management initiated : 11/23/2022 Notes: Electronic Signature(s) Signed: 03/02/2023 4:47:30 PM By: Shawn Stall RN, BSN Entered By: Shawn Stall on 03/02/2023 16:05:24 [name], [name] ([id number]) [id number] Page 7 of 11 -------------------------------------------------------------------------------- Pain Assessment Details Patient Name: Date of Service: [name] Draft NDRA Park. 03/02/2023 3:30 PM Medical Record Number: [id number] Patient Account Number: [id number] Date of Birth/Sex: Treating RN: June 29, 1965 (57 y.o. F) Primary Care Ulyana Pitones: [name]  Other Clinician: Referring Sharetta Ricchio: Treating Laycie Schriner/Extender: [name] in Treatment: 14 Active Problems Location of Pain Severity and Description of Pain Patient Has Paino Yes Site Locations Pain Location: Generalized Pain Rate the pain. Current Pain Level: 4 Pain Management and Medication Current Pain Management: Electronic Signature(s) Signed: 03/05/2023 12:52:25 PM By: Thayer Dallas Entered By: Thayer Dallas on 03/02/2023 15:45:17 -------------------------------------------------------------------------------- Patient/Caregiver Education Details Patient Name: Date of Service: [name] Draft NDRA Park. 11/5/2024andnbsp3:30 PM Medical Record Number: [id number] Patient Account Number: [id number] Date of Birth/Gender: Treating RN: 1965/05/05 (57 y.o. [name] Primary Care Physician: [name] Other Clinician: Referring Physician: Treating Physician/Extender: [name] in Treatment: 14 Education Assessment Education Provided To: Patient Education Topics Provided Wound/Skin Impairment: Handouts: Caring for Your Ulcer Methods: Explain/Verbal [name], [name] ([id number]) [id number] Page 8 of 11 Responses: Reinforcements needed Electronic Signature(s) Signed: 03/02/2023 4:47:30 PM By: Shawn Stall RN, BSN Entered By: Shawn Stall on 03/02/2023 16:05:36 -------------------------------------------------------------------------------- Wound Assessment Details Patient Name: Date of Service: [name] NDRA Park. 03/02/2023 3:30 PM Medical Record Number: [id number] Patient Account Number: [id number] Date of Birth/Sex: Treating RN: [birth year] (57 y.o. F) Primary Care Fransheska Willingham: [name] Other Clinician: Referring Jerico Grisso: Treating Keltin Baird/Extender: [name] in Treatment: 14 Wound Status Wound Number: 4 Primary Diabetic Wound/Ulcer of the Lower  Extremity Etiology: Wound Location: Right, Lateral Ankle Wound Open Wounding Event: Trauma Status: Date Acquired: 07/27/2022 Comorbid Anemia, Sleep Apnea, Type II Diabetes, Gout, Osteoarthritis, Weeks Of Treatment: 14 History: Osteomyelitis,  Neuropathy Clustered Wound: No Pending Amputation On Presentation Photos Wound Measurements Length: (cm) 1.5 Width: (cm) 1.4 Depth: (cm) 0.1 Area: (cm) 1.649 Volume: (cm) 0.165 % Reduction in Area: 42.9% % Reduction in Volume: 71.5% Epithelialization: Small (1-33%) Tunneling: No Undermining: No Wound Description Classification: Grade 1 Wound Margin: Distinct, outline attached Exudate Amount: Medium Exudate Type: Serosanguineous Exudate Color: red, brown Foul Odor After Cleansing: No Slough/Fibrino Yes Wound Bed Granulation Amount: Small (1-33%) Exposed Structure Granulation Quality: Red, Pink Fascia Exposed: No Necrotic Amount: Large (67-100%) Fat Layer (Subcutaneous Tissue) Exposed: Yes Necrotic Quality: Adherent Slough Tendon Exposed: No Muscle Exposed: No Joint Exposed: No Bone Exposed: No Periwound Skin Texture Texture Color No Abnormalities Noted: No No Abnormalities Noted: No [name], [name] ([id number]) [id number] Page 9 of 11 Callus: No Atrophie Blanche: No Crepitus: No Cyanosis: No Excoriation: No Ecchymosis: No Induration: No Erythema: Yes Rash: No Erythema Location: Circumferential Scarring: No Erythema Measurement: Marked Hemosiderin Staining: No Moisture Mottled: No No Abnormalities Noted: No Pallor: No Dry / Scaly: No Rubor: No Maceration: No Temperature / Pain Temperature: No Abnormality Treatment Notes Wound #4 (Ankle) Wound Laterality: Right, Lateral Cleanser Vashe 5.8 (oz) Discharge Instruction: Cleanse the wound with Vashe prior to applying a clean dressing using gauze sponges, not tissue or cotton balls. Peri-Wound Care Topical Santyl Collagenase Ointment,  30 (gm), tube Primary Dressing Hydrofera Blue Ready Transfer Foam, 2.5x2.5 (in/in) Discharge Instruction: Apply directly to wound bed as directed Secondary Dressing Zetuvit Plus Silicone Border Dressing 4x4 (in/in) Discharge Instruction: Apply silicone border over primary dressing as directed. Tegaderm Absorbent Dressing, Square, 7.87 x 8 (in/in) Secured With Compression Wrap Compression Stockings Add-Ons Electronic Signature(s) Signed: 03/05/2023 12:52:25 PM By: Thayer Dallas Entered By: Thayer Dallas on 03/02/2023 15:53:41 -------------------------------------------------------------------------------- Wound Assessment Details Patient Name: Date of Service: [name] NDRA Park. 03/02/2023 3:30 PM Medical Record Number: [id number] Patient Account Number: [id number] Date of Birth/Sex: Treating RN: [birth year] (57 y.o. F) Primary Care Vada Yellen: [name] Other Clinician: Referring Phenix Vandermeulen: Treating Brylinn Teaney/Extender: [name] in Treatment: 14 Wound Status Wound Number: 5 Primary 2nd degree Burn Etiology: Wound Location: Plantar Hand - 5th Digit Wound Open Wounding Event: Thermal Burn Status: Date Acquired: 12/28/2022 Comorbid Anemia, Sleep Apnea, Type II Diabetes, Gout, Osteoarthritis, Weeks Of Treatment: 0 History: Osteomyelitis, Neuropathy Clustered Wound: No Photos [name], [name] ([id number]) [id number] Page 10 of 11 Wound Measurements Length: (cm) 1.7 Width: (cm) 1.5 Depth: (cm) 0.1 Area: (cm) 2.003 Volume: (cm) 0.2 % Reduction in Area: % Reduction in Volume: Epithelialization: None Tunneling: No Undermining: No Wound Description Classification: Unclassifiable Wound Margin: Distinct, outline attached Exudate Amount: Medium Exudate Type: Serosanguineous Exudate Color: red, brown Foul Odor After Cleansing: No Slough/Fibrino Yes Wound Bed Granulation Amount: None Present (0%) Exposed  Structure Necrotic Amount: Large (67-100%) Fascia Exposed: No Necrotic Quality: Adherent Slough Fat Layer (Subcutaneous Tissue) Exposed: Yes Tendon Exposed: No Muscle Exposed: No Joint Exposed: No Bone Exposed: No Periwound Skin Texture Texture Color No Abnormalities Noted: No No Abnormalities Noted: No Callus: No Atrophie Blanche: No Crepitus: No Cyanosis: No Excoriation: No Ecchymosis: No Induration: No Erythema: No Rash: No Hemosiderin Staining: No Scarring: No Mottled: No Pallor: No Moisture Rubor: No No Abnormalities Noted: No Dry / Scaly: No Maceration: No Treatment Notes Wound #5 (Hand - 5th Digit) Wound Laterality: Plantar Cleanser Vashe 5.8 (oz) Discharge Instruction: Cleanse the wound with Vashe prior to applying a clean dressing using gauze sponges, not tissue or cotton balls. Peri-Wound Care Topical Santyl Collagenase Ointment,  30 (gm), tube Primary Dressing Hydrofera Blue Ready Transfer Foam, 2.5x2.5 (in/in) Discharge Instruction: Apply directly to wound bed as directed Secondary Dressing Woven Gauze Sponges 2x2 in Discharge Instruction: Apply over primary dressing as directed. Secured With 103M Medipore H Soft Cloth Surgical T ape, 4 x 10 (in/yd) Discharge Instruction: Secure with tape as directed. [name], [name] ([id number]) [id number] Page 11 of 11 Compression Wrap Compression Stockings Add-Ons Electronic Signature(s) Signed: 03/02/2023 4:47:30 PM By: Shawn Stall RN, BSN Entered By: Shawn Stall on 03/02/2023 16:09:39 -------------------------------------------------------------------------------- Vitals Details Patient Name: Date of Service: [name] Draft NDRA Park. 03/02/2023 3:30 PM Medical Record Number: [id number] Patient Account Number: [id number] Date of Birth/Sex: Treating RN: 09-20-1965 (57 y.o. F) Primary Care Jla Reynolds: [name] Other Clinician: Referring Jalin Alicea: Treating Larnie Heart/Extender:  [name] in Treatment: 14 Vital Signs Time Taken: 15:52 Temperature (F): 98.4 Height (in): 64 Pulse (bpm): 93 Weight (lbs): 261 Respiratory Rate (breaths/min): 18 Body Mass Index (BMI): 44.8 Blood Pressure (mmHg): 123/77 Reference Range: 80 - 120 mg / dl Electronic Signature(s) Signed: 03/05/2023 12:52:25 PM By: Thayer Dallas Entered By: Thayer Dallas on 03/02/2023 15:55:21

## 2023-03-10 ENCOUNTER — Encounter: Payer: Commercial Managed Care - HMO | Admitting: Infectious Diseases

## 2023-03-17 ENCOUNTER — Encounter (HOSPITAL_BASED_OUTPATIENT_CLINIC_OR_DEPARTMENT_OTHER): Payer: Commercial Managed Care - HMO | Admitting: Internal Medicine

## 2023-03-17 DIAGNOSIS — E11621 Type 2 diabetes mellitus with foot ulcer: Secondary | ICD-10-CM | POA: Diagnosis not present

## 2023-03-18 NOTE — Progress Notes (Signed)
DANESHIA, LAPORTE R ([id number]) [id number] Page 1 of 10 Visit Report for 03/17/2023 Arrival Information Details Patient Name: Date of Service: [name] Draft NDRA R. 03/17/2023 3:00 PM Medical Record Number: [id number] Patient Account Number: [id number] Date of Birth/Sex: Treating RN: Jan 24, 1966 (57 y.o. [name] Primary Care Amaris Delafuente: Antony Haste Other Clinician: Referring Mykell Genao: Treating Nikol Lemar/Extender: Murtis Sink in Treatment: 16 Visit Information History Since Last Visit Added or deleted any medications: No Patient Arrived: Ambulatory Any new allergies or adverse reactions: No Arrival Time: 15:35 Had a fall or experienced change in No Accompanied By: self activities of daily living that may affect Transfer Assistance: None risk of falls: Patient Identification Verified: Yes Signs or symptoms of abuse/neglect since last visito No Patient Requires Transmission-Based Precautions: No Hospitalized since last visit: No Patient Has Alerts: No Implantable device outside of the clinic excluding No cellular tissue based products placed in the center since last visit: Has Dressing in Place as Prescribed: Yes Pain Present Now: No Electronic Signature(s) Signed: 03/17/2023 5:21:16 PM By: Karie Schwalbe RN Entered By: Karie Schwalbe on 03/17/2023 16:22:34 -------------------------------------------------------------------------------- Encounter Discharge Information Details Patient Name: Date of Service: [name] Draft NDRA R. 03/17/2023 3:00 PM Medical Record Number: [id number] Patient Account Number: [id number] Date of Birth/Sex: Treating RN: November 09, 1965 (57 y.o. [name] Primary Care Moritz Lever: Antony Haste Other Clinician: Referring Shakaya Bhullar: Treating Johntavius Shepard/Extender: Murtis Sink in Treatment: 16 Encounter Discharge Information Items Discharge Condition:  Stable Ambulatory Status: Ambulatory Discharge Destination: Home Transportation: Private Auto Accompanied By: self Schedule Follow-up Appointment: Yes Clinical Summary of Care: Patient Declined Electronic Signature(s) Signed: 03/17/2023 5:21:16 PM By: Karie Schwalbe RN Entered By: Karie Schwalbe on 03/17/2023 16:27:34 Godsil, Garald Braver ([id number]) [id number] Page 2 of 10 -------------------------------------------------------------------------------- Lower Extremity Assessment Details Patient Name: Date of Service: [name] Draft NDRA R. 03/17/2023 3:00 PM Medical Record Number: [id number] Patient Account Number: [id number] Date of Birth/Sex: Treating RN: 09-Nov-1965 (57 y.o. [name] Primary Care Aulton Routt: Antony Haste Other Clinician: Referring Ruey Storer: Treating Jazmene Racz/Extender: Murtis Sink in Treatment: 16 Edema Assessment Assessed: [name]: No] [name]: No] Edema: [Left: Ye] [Right: s] Calf Left: Right: Point of Measurement: 34.5 cm From Medial Instep 41.5 cm Ankle Left: Right: Point of Measurement: 12.5 cm From Medial Instep 25.5 cm Vascular Assessment Pulses: Dorsalis Pedis Palpable: [Right:Yes] Extremity colors, hair growth, and conditions: Extremity Color: [Right:Normal] Hair Growth on Extremity: [Right:No] Temperature of Extremity: [Right:Warm] Capillary Refill: [Right:< 3 seconds] Dependent Rubor: [Right:No No] Electronic Signature(s) Signed: 03/17/2023 5:21:16 PM By: Karie Schwalbe RN Entered By: Karie Schwalbe on 03/17/2023 16:24:06 -------------------------------------------------------------------------------- Multi Wound Chart Details Patient Name: Date of Service: [name] Draft NDRA R. 03/17/2023 3:00 PM Medical Record Number: [id number] Patient Account Number: [id number] Date of Birth/Sex: Treating RN: 1966-03-21 (57 y.o. F) Primary Care Taylormarie Register: Antony Haste Other  Clinician: Referring [name]: Treating Derrick Tiegs/Extender: Murtis Sink in Treatment: 16 [4:Photos:] [N/A:N/A] Cedar, Garald Braver ([id number]) [4:Right, Lateral Ankle Wound Location: Trauma Wounding Event: Diabetic Wound/Ulcer of the Lower Primary Etiology: Extremity Anemia, Sleep Apnea, Type II Comorbid History: Diabetes, Gout, Osteoarthritis, Osteomyelitis, Neuropathy 07/27/2022 Date Acquired:  16 Weeks of Treatment: Open Wound Status: No Wound Recurrence: Yes Pending A mputation on Presentation: 1.4x1x0.1 Measurements L x W x D (cm) 1.1 A (cm) : rea 0.11 Volume (cm) : 61.90% % Reduction in A rea: 81.00% % Reduction in Volume: Starting Position  1 (o'clock): Ending Position 1 (o'clock): Maximum Distance 1 (  cm): No Undermining: Grade 1 Classification: Medium Exudate A mount: Serosanguineous Exudate Type: red, brown Exudate Color: Distinct, outline attached Wound Margin: Small (1-33%) Granulation  A mount: Red, Pink Granulation Quality: Large (67-100%) Necrotic A mount: Fat Layer (Subcutaneous Tissue): Yes Fat Layer (Subcutaneous Tissue): Yes N/A Exposed Structures: Fascia: No Tendon: No Muscle: No Joint: No Bone: No Small (1-33%)  Epithelialization: N/A Debridement: Pre-procedure Verification/Time Out N/A Taken: N/A Pain Control: N/A Tissue Debrided: N/A Level: N/A Debridement A (sq cm): rea N/A Instrument: N/A Bleeding: N/A Hemostasis A chieved: N/A Debridement Treatment  Response: N/A Post Debridement Measurements L x W x D (cm) N/A Post Debridement Volume: (cm) Excoriation: No Periwound Skin Texture: Induration: No Callus: No Crepitus: No Rash: No Scarring: No Maceration: No Periwound Skin Moisture: Dry/Scaly: No  Erythema: Yes Periwound Skin Color: Atrophie Blanche: No Cyanosis: No Ecchymosis: No Hemosiderin Staining: No Mottled: No Pallor: No Rubor: No Circumferential Erythema Location: Marked Erythema Measurement: No Abnormality Temperature: N/A Procedures  Performed:]  [5:Right, Plantar Hand - 1st Digit Thermal Burn 2nd degree Burn Anemia, Sleep Apnea, Type II Diabetes, Gout, Osteoarthritis, Osteomyelitis, Neuropathy 12/28/2022 2 Open No No 1.6x1.1x0.1 1.382 0.138 31.00% 31.00% 12 6 0.2 Yes Full Thickness  Without Exposed Support Structures Medium Serosanguineous red, brown Distinct, outline attached Small (1-33%) Pink Large (67-100%) Fascia: No Tendon: No Muscle: No Joint: No Bone: No None Debridement - Excisional 15:57 Lidocaine 4% T opical Solution  Subcutaneous Skin/Subcutaneous Tissue 1.38 Curette Minimum Silver Nitrate Procedure was tolerated well 1.6x1.1x0.1 0.138 Excoriation: No Induration: No Callus: No Crepitus: No Rash: No Scarring: No Maceration: No Dry/Scaly: No Atrophie Blanche: No  Cyanosis: No Ecchymosis: No Erythema: No Hemosiderin Staining: No Mottled: No Pallor: No Rubor: No N/A N/A N/A Debridement] [N/A:[id number] Page 3 of 10 N/A N/A N/A N/A N/A N/A N/A N/A N/A N/A N/A N/A N/A N/A N/A N/A N/A N/A  N/A N/A N/A N/A N/A N/A N/A N/A N/A N/A N/A N/A N/A N/A N/A N/A N/A N/A N/A N/A N/A N/A N/A N/A N/A] Treatment Notes Electronic Signature(s) Signed: 03/17/2023 4:37:54 PM By: Baltazar Najjar MD Entered By: Baltazar Najjar on 03/17/2023 16:08:38 Canales, Garald Braver ([id number]) [id number] Page 4 of 10 -------------------------------------------------------------------------------- Multi-Disciplinary Care Plan Details Patient Name: Date of Service: [name] Draft NDRA R. 03/17/2023 3:00 PM Medical Record Number: [id number] Patient Account Number: [id number] Date of Birth/Sex: Treating RN: 16-Sep-1965 (57 y.o. [name] Primary Care Elwanda Moger: Antony Haste Other Clinician: Referring Vimal Derego: Treating Nayleah Gamel/Extender: Murtis Sink in Treatment: 16 Active Inactive Necrotic Tissue Nursing Diagnoses: Knowledge deficit related to management of necrotic/devitalized  tissue Goals: Necrotic/devitalized tissue will be minimized in the wound bed Date Initiated: 11/23/2022 Target Resolution Date: 04/27/2023 Goal Status: Active Patient/caregiver will verbalize understanding of reason and process for debridement of necrotic tissue Date Initiated: 11/23/2022 Target Resolution Date: 04/27/2023 Goal Status: Active Interventions: Assess patient pain level pre-, during and post procedure and prior to discharge Provide education on necrotic tissue and debridement process Treatment Activities: Biologic debridement : 11/23/2022 Excisional debridement : 11/23/2022 Notes: Nutrition Nursing Diagnoses: Potential for alteratiion in Nutrition/Potential for imbalanced nutrition Goals: Patient/caregiver agrees to and verbalizes understanding of need to obtain nutritional consultation Date Initiated: 11/23/2022 Date Inactivated: 12/22/2022 Target Resolution Date: 12/25/2022 Goal Status: Met Patient/caregiver will maintain therapeutic glucose control Date Initiated: 11/23/2022 Target Resolution Date: 04/27/2023 Goal Status: Active Interventions: Assess HgA1c results as ordered upon admission and as needed Provide education on elevated blood sugars and impact on wound healing Provide  education on nutrition Treatment Activities: Obtain HgA1c : 11/23/2022 Patient referred to Primary Care Physician for further nutritional evaluation : 11/23/2022 Notes: Pain, Acute or Chronic Nursing Diagnoses: Potential alteration in comfort, pain Goals: Patient will verbalize adequate pain control and receive pain control interventions during procedures as needed Date Initiated: 11/23/2022 Target Resolution Date: 04/27/2023 Goal Status: Active Patient/caregiver will verbalize comfort level met Date Initiated: 11/23/2022 Target Resolution Date: 04/27/2023 Goal Status: Active Interventions: Nations, Ricarda R ([id number]) [id number] Page 5 of 10 Encourage  patient to take pain medications as prescribed Provide education on pain management Reposition patient for comfort Treatment Activities: Administer pain control measures as ordered : 11/23/2022 Notes: Wound/Skin Impairment Nursing Diagnoses: Knowledge deficit related to ulceration/compromised skin integrity Goals: Ulcer/skin breakdown will heal within 14 weeks Date Initiated: 11/23/2022 Target Resolution Date: 04/27/2023 Goal Status: Active Interventions: Assess patient/caregiver ability to perform ulcer/skin care regimen upon admission and as needed Assess ulceration(s) every visit Provide education on ulcer and skin care Treatment Activities: Skin care regimen initiated : 11/23/2022 Topical wound management initiated : 11/23/2022 Notes: Electronic Signature(s) Signed: 03/17/2023 5:21:16 PM By: Karie Schwalbe RN Entered By: Karie Schwalbe on 03/17/2023 16:25:06 -------------------------------------------------------------------------------- Pain Assessment Details Patient Name: Date of Service: [name] Draft NDRA R. 03/17/2023 3:00 PM Medical Record Number: [id number] Patient Account Number: [id number] Date of Birth/Sex: Treating RN: [birth year] (57 y.o. [name] Primary Care Nghia Mcentee: Antony Haste Other Clinician: Referring Jhoselin Crume: Treating Anneli Bing/Extender: Murtis Sink in Treatment: 16 Active Problems Location of Pain Severity and Description of Pain Patient Has Paino No Site Locations Pain Management and Medication Current Pain Management: KOLE, TROIA R ([id number]) [id number] Page 6 of 10 Electronic Signature(s) Signed: 03/17/2023 5:21:16 PM By: Karie Schwalbe RN Entered By: Karie Schwalbe on 03/17/2023 16:23:59 -------------------------------------------------------------------------------- Patient/Caregiver Education Details Patient Name: Date of Service: [name] Draft NDRA R.  11/20/2024andnbsp3:00 PM Medical Record Number: [id number] Patient Account Number: [id number] Date of Birth/Gender: Treating RN: [birth year] (57 y.o. [name] Primary Care Physician: Antony Haste Other Clinician: Referring Physician: Treating Physician/Extender: Murtis Sink in Treatment: 16 Education Assessment Education Provided To: Patient Education Topics Provided Wound/Skin Impairment: Methods: Explain/Verbal Responses: State content correctly [employer]) Signed: 03/17/2023 5:21:16 PM By: Karie Schwalbe RN Entered By: Karie Schwalbe on 03/17/2023 16:25:37 -------------------------------------------------------------------------------- Wound Assessment Details Patient Name: Date of Service: [name] NDRA R. 03/17/2023 3:00 PM Medical Record Number: [id number] Patient Account Number: [id number] Date of Birth/Sex: Treating RN: 1965-05-21 (57 y.o. [name] Primary Care Wylene Weissman: Antony Haste Other Clinician: Referring Denora Wysocki: Treating Eathel Pajak/Extender: Murtis Sink in Treatment: 16 Wound Status Wound Number: 4 Primary Diabetic Wound/Ulcer of the Lower Extremity Etiology: Wound Location: Right, Lateral Ankle Wound Open Wounding Event: Trauma Status: Date Acquired: 07/27/2022 Comorbid Anemia, Sleep Apnea, Type II Diabetes, Gout, Osteoarthritis, Weeks Of Treatment: 16 History: Osteomyelitis, Neuropathy Clustered Wound: No Pending Amputation On Presentation Photos DILYN, MCDAVITT R ([id number]) [id number] Page 7 of 10 Wound Measurements Length: (cm) Width: (cm) Depth: (cm) Area: (cm) Volume: (cm) 1.4 % Reduction in Area: 61.9% 1 % Reduction in Volume: 81% 0.1 Epithelialization: Small (1-33%) 1.1 Tunneling: No 0.11 Undermining: No Wound Description Classification: Grade 1 Wound Margin: Distinct, outline attached Exudate Amount: Medium Exudate  Type: Serosanguineous Exudate Color: red, brown Foul Odor After Cleansing: No Slough/Fibrino Yes Wound Bed Granulation Amount: Small (1-33%) Exposed Structure Granulation Quality: Red, Pink Fascia Exposed: No Necrotic Amount: Large (67-100%) Fat Layer (Subcutaneous Tissue) Exposed: Yes Necrotic Quality:  Adherent Slough Tendon Exposed: No Muscle Exposed: No Joint Exposed: No Bone Exposed: No Periwound Skin Texture Texture Color No Abnormalities Noted: No No Abnormalities Noted: No Callus: No Atrophie Blanche: No Crepitus: No Cyanosis: No Excoriation: No Ecchymosis: No Induration: No Erythema: Yes Rash: No Erythema Location: Circumferential Scarring: No Erythema Measurement: Marked Hemosiderin Staining: No Moisture Mottled: No No Abnormalities Noted: No Pallor: No Dry / Scaly: No Rubor: No Maceration: No Temperature / Pain Temperature: No Abnormality Treatment Notes Wound #4 (Ankle) Wound Laterality: Right, Lateral Cleanser Vashe 5.8 (oz) Discharge Instruction: Cleanse the wound with Vashe prior to applying a clean dressing using gauze sponges, not tissue or cotton balls. Peri-Wound Care Topical Santyl Collagenase Ointment, 30 (gm), tube Primary Dressing Hydrofera Blue Ready Transfer Foam, 2.5x2.5 (in/in) Discharge Instruction: Apply directly to wound bed as directed Secondary Dressing Zetuvit Plus Silicone Border Dressing 4x4 (in/in) Discharge Instruction: Apply silicone border over primary dressing as directed. Tegaderm Absorbent Dressing, Square, 7.87 x 8 (in/in) Secured With GIOVANNY, ESCHRICH R ([id number]) [id number] Page 8 of 10 Compression Wrap Compression Stockings Add-Ons Electronic Signature(s) Signed: 03/17/2023 5:21:16 PM By: Karie Schwalbe RN Entered By: Karie Schwalbe on 03/17/2023 15:50:26 -------------------------------------------------------------------------------- Wound Assessment Details Patient Name: Date  of Service: [name] Draft NDRA R. 03/17/2023 3:00 PM Medical Record Number: [id number] Patient Account Number: [id number] Date of Birth/Sex: Treating RN: [birth year] (57 y.o. [name] Primary Care Telesa Jeancharles: Antony Haste Other Clinician: Referring Devyn Griffing: Treating Edoardo Laforte/Extender: Murtis Sink in Treatment: 16 Wound Status Wound Number: 5 Primary 2nd degree Burn Etiology: Wound Location: Right, Plantar Hand - 1st Digit Wound Open Wounding Event: Thermal Burn Status: Date Acquired: 12/28/2022 Comorbid Anemia, Sleep Apnea, Type II Diabetes, Gout, Osteoarthritis, Weeks Of Treatment: 2 History: Osteomyelitis, Neuropathy Clustered Wound: No Photos Wound Measurements Length: (cm) 1.6 Width: (cm) 1.1 Depth: (cm) 0.1 Area: (cm) 1.382 Volume: (cm) 0.138 % Reduction in Area: 31% % Reduction in Volume: 31% Epithelialization: None Tunneling: No Undermining: Yes Starting Position (o'clock): 12 Ending Position (o'clock): 6 Maximum Distance: (cm) 0.2 Wound Description Classification: Full Thickness Without Exposed Suppor Wound Margin: Distinct, outline attached Exudate Amount: Medium Exudate Type: Serosanguineous Exudate Color: red, brown t Structures Foul Odor After Cleansing: No Slough/Fibrino Yes Wound Bed Granulation Amount: Small (1-33%) Exposed Structure Granulation Quality: Pink Fascia Exposed: No Necrotic Amount: Large (67-100%) Fat Layer (Subcutaneous Tissue) Exposed: Yes Necrotic Quality: Adherent Slough Tendon Exposed: No Muscle Exposed: No Joint Exposed: No Lynds, Brittish R ([id number]) [id number] Page 9 of 10 Bone Exposed: No Periwound Skin Texture Texture Color No Abnormalities Noted: No No Abnormalities Noted: No Callus: No Atrophie Blanche: No Crepitus: No Cyanosis: No Excoriation: No Ecchymosis: No Induration: No Erythema: No Rash: No Hemosiderin Staining: No Scarring: No Mottled:  No Pallor: No Moisture Rubor: No No Abnormalities Noted: No Dry / Scaly: No Maceration: No Treatment Notes Wound #5 (Hand - 1st Digit) Wound Laterality: Plantar, Right Cleanser Vashe 5.8 (oz) Discharge Instruction: Cleanse the wound with Vashe prior to applying a clean dressing using gauze sponges, not tissue or cotton balls. Peri-Wound Care Topical Santyl Collagenase Ointment, 30 (gm), tube Primary Dressing Hydrofera Blue Ready Transfer Foam, 2.5x2.5 (in/in) Discharge Instruction: Apply directly to wound bed as directed Secondary Dressing Woven Gauze Sponges 2x2 in Discharge Instruction: Apply over primary dressing as directed. Secured With 54M Medipore H Soft Cloth Surgical T ape, 4 x 10 (in/yd) Discharge Instruction: Secure with tape as directed. Compression Wrap Compression Stockings Add-Ons Electronic Signature(s) Signed: 03/17/2023 5:21:16 PM By: Baruch Merl,  Randa Evens RN Entered By: Karie Schwalbe on 03/17/2023 16:02:10 -------------------------------------------------------------------------------- Vitals Details Patient Name: Date of Service: [name] Draft NDRA R. 03/17/2023 3:00 PM Medical Record Number: [id number] Patient Account Number: [id number] Date of Birth/Sex: Treating RN: 1966/03/24 (57 y.o. [name] Primary Care Akshith Moncus: Antony Haste Other Clinician: Referring Gorman Safi: Treating Shaana Acocella/Extender: Murtis Sink in Treatment: 16 Vital Signs Time Taken: 15:47 Temperature (F): 98.7 Height (in): 64 Pulse (bpm): 71 Weight (lbs): 261 Respiratory Rate (breaths/min): 16 Body Mass Index (BMI): 44.8 Blood Pressure (mmHg): 110/67 Reference Range: 80 - 120 mg / dl Feld, Vannary R ([id number]) [id number] Page 10 of 10 Electronic Signature(s) Signed: 03/17/2023 5:21:16 PM By: Karie Schwalbe RN Entered By: Karie Schwalbe on 03/17/2023 16:23:02
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
## 2023-03-23 ENCOUNTER — Ambulatory Visit: Payer: Managed Care, Other (non HMO) | Admitting: Infectious Diseases

## 2023-03-23 VITALS — BP 124/61 | HR 80 | Temp 98.4°F | Ht 64.0 in | Wt 267.5 lb

## 2023-03-23 DIAGNOSIS — Z794 Long term (current) use of insulin: Secondary | ICD-10-CM | POA: Diagnosis not present

## 2023-03-23 DIAGNOSIS — B999 Unspecified infectious disease: Secondary | ICD-10-CM

## 2023-03-23 DIAGNOSIS — E1165 Type 2 diabetes mellitus with hyperglycemia: Secondary | ICD-10-CM | POA: Diagnosis not present

## 2023-03-23 DIAGNOSIS — S91309D Unspecified open wound, unspecified foot, subsequent encounter: Secondary | ICD-10-CM

## 2023-03-23 NOTE — Progress Notes (Signed)
Subjective:    Patient ID: [name], female  DOB: 1966/04/16, 57 y.o.        MRN: [id number]   HPI 57 yo F with DM2, obesity, left total knee replacement 2007, right total knee replacement 2012. She came to [facility] on January 30, 2013. She underwent irrigation and debridement of her right total knee replacement with exchange of parts. Cipro was added to her antibiotic regimen on October 8. She was found to have group B strep in her joint culture from her outpatient aspiration. On October 9 her antibiotics were changed to ceftriaxone 2 g once daily and she was discharged home. 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    Was planned to stop her anbx at 1 year (Oct 2015) however at ID f/u on 11-17 was continued on anbx through her foot surgery.    After L foot surgery 01-25-15 (5 bones removed, rod from great toe to ankle), she had fevers. She had 2nd anbx added. She had repeat aspirate of her L knee that was clear. She had Cx sent from her foot that was (-).   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    She had further repair of her ankle on July 10, 2016.    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     She was seen in ID on 10-19-16 and was noted to have continued issues with wound. Her anbx were extended to 12-08-16.     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 She was treated anbx, mupirocin.    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 She had MSSA bacteremia at that time, was treated with nfcillin --> cefazolin. Her TTE was (-). She completed her anbx on 10-19-17.    In hospital 10-28 to 02-23-18 with R index finger osteo that failed outpt anbx (keflex). She got better initially but then worsened. She was d/c home with bactrim for 10 days.    She was seen 05-2018 after she removed the skin from her distal L 3rd digit.   She has been seen by rheum at [facility] for OA.      She was in [facility] 04-07-19 with I & D of R foot, She was d/c home on vanco/invanz to end on 05-19-19. Her Cx was mixed G+.  Her course was complicated by Cr 1.29 and Vanco Tr 27.3 on 04-26-19.   She had reconstructive surgery of her R foot 07-04-19. She was continued on keflex through her surgery.  She was seen in surgery 08-04-19 and had sutures out, fiberglass cast applied.  She is having f/u at [facility] today. She is awaiting f/u xrays.  Her wounds are closed (1 small scab is all that remains).    Last A1C was 6.0% per pt.  Had optho 2020. Knows she is due, but can't get in with a wheelchair.    She was adm 9-19 and then 9-30 with L 3rd digit osteo. Her second adm was due to wound non-healing and persistent osteo. She had further debridement/amputation with second surgery.  Her Cx grew MRSA (R- Tet, Emycin, Clinda, Ox). I saw her in f/u 01-2023- started decolonization protocol- mupirocin, bactrim low dose daily, and CHG baths.  Today "I don't feel good". Thumb and ankle  wounds are healing.  Has not had problems with CHG baths, monthly mupirocin.   FSG have been irregular due to irregular insulin supply (229 this am after OJ). Mid-day mid 100s.  Last A1C 6.0% (01-26-23)   Health Maintenance  Topic Date Due   FOOT EXAM  Never done   OPHTHALMOLOGY EXAM  Never done   Diabetic kidney evaluation - Urine ACR  Never done   Hepatitis C Screening  Never done   Cervical Cancer Screening (HPV/Pap Cotest)  Never done   Colonoscopy  Never done   MAMMOGRAM  Never done   Zoster Vaccines- Shingrix (1 of 2) Never done   COVID-19 Vaccine (3 - 2023-24 season) 12/27/2022   HEMOGLOBIN A1C  01/02/2023   INFLUENZA VACCINE  07/26/2023 (Originally 11/26/2022)   Diabetic kidney evaluation - eGFR measurement  01/14/2024   DTaP/Tdap/Td (3 - Td or Tdap) 02/22/2028   HIV Screening  Completed   HPV VACCINES  Aged Out      Review of Systems  Constitutional:   Negative for chills, fever and weight loss.  Respiratory:  Negative for cough and shortness of breath.   Gastrointestinal:  Positive for constipation (prn miralax. due to opioids.). Negative for diarrhea.  Genitourinary:  Negative for dysuria.  Musculoskeletal:        Wounds    Please see HPI. All other systems reviewed and negative.     Objective:  Physical Exam Vitals reviewed.  Constitutional:      General: She is not in acute distress.    Appearance: She is not toxic-appearing.  Musculoskeletal:     Right lower leg: Edema present.  Neurological:     Mental Status: She is alert.             Assessment & Plan:

## 2023-03-23 NOTE — Assessment & Plan Note (Signed)
Her glc appear to be improved via her A1C.  Appreciate her PCP mgmt.

## 2023-03-23 NOTE — Assessment & Plan Note (Signed)
Her R ankle wound is clean, no d/c. No heat.  Her digit wounds are as well.  Will check her IG levels.  Will continue her daily bactrim, monthly mupirocin, weekly CHG.  Will see her back in 3 months.

## 2023-03-24 LAB — IGG, IGA, IGM
IgA/Immunoglobulin A, Serum: 302 mg/dL (ref 87–352)
IgG (Immunoglobin G), Serum: 1116 mg/dL (ref 586–1602)
IgM (Immunoglobulin M), Srm: 108 mg/dL (ref 26–217)

## 2023-03-29 ENCOUNTER — Encounter (HOSPITAL_BASED_OUTPATIENT_CLINIC_OR_DEPARTMENT_OTHER): Payer: Commercial Managed Care - HMO | Attending: Internal Medicine | Admitting: Internal Medicine

## 2023-03-29 DIAGNOSIS — Z89512 Acquired absence of left leg below knee: Secondary | ICD-10-CM | POA: Insufficient documentation

## 2023-03-29 DIAGNOSIS — Z792 Long term (current) use of antibiotics: Secondary | ICD-10-CM | POA: Insufficient documentation

## 2023-03-29 DIAGNOSIS — E1140 Type 2 diabetes mellitus with diabetic neuropathy, unspecified: Secondary | ICD-10-CM | POA: Insufficient documentation

## 2023-03-29 DIAGNOSIS — E11621 Type 2 diabetes mellitus with foot ulcer: Secondary | ICD-10-CM | POA: Insufficient documentation

## 2023-03-29 DIAGNOSIS — G4730 Sleep apnea, unspecified: Secondary | ICD-10-CM | POA: Insufficient documentation

## 2023-03-29 DIAGNOSIS — D649 Anemia, unspecified: Secondary | ICD-10-CM | POA: Diagnosis not present

## 2023-03-29 DIAGNOSIS — M1990 Unspecified osteoarthritis, unspecified site: Secondary | ICD-10-CM | POA: Diagnosis not present

## 2023-03-29 DIAGNOSIS — L97518 Non-pressure chronic ulcer of other part of right foot with other specified severity: Secondary | ICD-10-CM | POA: Diagnosis not present

## 2023-03-29 DIAGNOSIS — E1151 Type 2 diabetes mellitus with diabetic peripheral angiopathy without gangrene: Secondary | ICD-10-CM | POA: Insufficient documentation
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
## 2023-03-30 NOTE — Progress Notes (Signed)
[name], Park Park ([id number]) [id number] Page 1 of 11 Visit Report for 03/29/2023 Arrival Information Details Patient Name: Date of Service: [name]. 03/29/2023 8:00 A M Medical Record Number: [id number] Patient Account Number: [id number] Date of Birth/Sex: Treating RN: [birth year] (57 y.o. [name], Millard.Loa Primary Care Malesha Suliman: Antony Haste Other Clinician: Referring Aysha Livecchi: Treating Siren Porrata/Extender: Murtis Sink in Treatment: 18 Visit Information History Since Last Visit Added or deleted any medications: No Patient Arrived: Ambulatory Any new allergies or adverse reactions: No Arrival Time: 08:02 Had a fall or experienced change in No Accompanied By: self activities of daily living that may affect Transfer Assistance: None risk of falls: Patient Identification Verified: Yes Signs or symptoms of abuse/neglect since last visito No Secondary Verification Process Completed: Yes Hospitalized since last visit: No Patient Requires Transmission-Based Precautions: No Implantable device outside of the clinic excluding No Patient Has Alerts: No cellular tissue based products placed in the center since last visit: Has Dressing in Place as Prescribed: Yes Pain Present Now: Yes Electronic Signature(s) Signed: 03/29/2023 5:10:34 PM By: Shawn Stall RN, BSN Entered By: Shawn Stall on 03/29/2023 08:06:14 -------------------------------------------------------------------------------- Encounter Discharge Information Details Patient Name: Date of Service: [name]. 03/29/2023 8:00 A M Medical Record Number: [id number] Patient Account Number: [id number] Date of Birth/Sex: Treating RN: 1965-11-11 (57 y.o. [name] Primary Care Gowri Suchan: Antony Haste Other Clinician: Referring Thora Scherman: Treating Reynalda Canny/Extender: Murtis Sink in Treatment: 18 Encounter Discharge Information  Items Post Procedure Vitals Discharge Condition: Stable Temperature (F): 98.6 Ambulatory Status: Ambulatory Pulse (bpm): 82 Discharge Destination: Home Respiratory Rate (breaths/min): 20 Transportation: Private Auto Blood Pressure (mmHg): 128/77 Accompanied By: self Schedule Follow-up Appointment: Yes Clinical Summary of Care: Electronic Signature(s) Signed: 03/29/2023 5:10:34 PM By: Shawn Stall RN, BSN Entered By: Shawn Stall on 03/29/2023 08:33:16 [name] ([id number]) [id number] Page 2 of 11 -------------------------------------------------------------------------------- Lower Extremity Assessment Details Patient Name: Date of Service: [name]. 03/29/2023 8:00 A M Medical Record Number: [id number] Patient Account Number: [id number] Date of Birth/Sex: Treating RN: Jul 11, 1965 (57 y.o. [name] Primary Care Colbert Curenton: Antony Haste Other Clinician: Referring Aunisty Reali: Treating Loraina Stauffer/Extender: Murtis Sink in Treatment: 18 Edema Assessment Assessed: [name]: No] [name]: Yes] Edema: [Left: N] [Right: o] Calf Left: Right: Point of Measurement: 34.5 cm From Medial Instep 41 cm Ankle Left: Right: Point of Measurement: 12.5 cm From Medial Instep 24 cm Vascular Assessment Pulses: Dorsalis Pedis Palpable: [Right:Yes] Extremity colors, hair growth, and conditions: Extremity Color: [Right:Normal] Hair Growth on Extremity: [Right:No] Temperature of Extremity: [Right:Warm] Capillary Refill: [Right:< 3 seconds] Dependent Rubor: [Right:No] Blanched when Elevated: [Right:No No] Toe Nail Assessment Left: Right: Thick: Yes Discolored: Yes Deformed: Yes Improper Length and Hygiene: No Electronic Signature(s) Signed: 03/29/2023 5:10:34 PM By: Shawn Stall RN, BSN Entered By: Shawn Stall on 03/29/2023  08:11:05 -------------------------------------------------------------------------------- Multi Wound Chart Details Patient Name: Date of Service: [name]. 03/29/2023 8:00 A M Medical Record Number: [id number] Patient Account Number: [id number] Date of Birth/Sex: Treating RN: 1965-06-02 (57 y.o. F) Primary Care Shanea Karney: Antony Haste Other Clinician: Referring Bergen Melle: Treating Emrie Gayle/Extender: Murtis Sink in Treatment: 18 Vital Signs Height(in): 64 Capillary Blood Glucose(mg/dl): 416 Weight(lbs): 606 Pulse(bpm): 82 [name] ([id number]) [phone].pdf Page 3 of 11 Body Mass Index(BMI): 44.8 Blood Pressure(mmHg): 128/77 Temperature(F): 98.6 Respiratory Rate(breaths/min): 20 [4:Photos:] [N/A:N/A] Right, Lateral Ankle Right, Plantar Hand - 1st Digit N/A Wound Location:  Trauma Thermal Burn N/A Wounding Event: Diabetic Wound/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 Burn N/A Primary Etiology: Extremity Anemia, Sleep Apnea, Type II Anemia, Sleep Apnea, Type II N/A Comorbid History: Diabetes, Gout, Osteoarthritis, Diabetes, Gout, Osteoarthritis, Osteomyelitis, Neuropathy Osteomyelitis, Neuropathy 07/27/2022 12/28/2022 N/A Date Acquired: 18 3 N/A Weeks of Treatment: Open Open N/A Wound Status: No No N/A Wound Recurrence: Yes No N/A Pending A mputation on Presentation: 1.9x1x0.1 1.2x0.9x0.1 N/A Measurements L x W x D (cm) 1.492 0.848 N/A A (cm) : rea 0.149 0.085 N/A Volume (cm) : 48.40% 57.70% N/A % Reduction in A rea: 74.20% 57.50% N/A % Reduction in Volume: Grade 1 Full Thickness Without Exposed N/A Classification: Support Structures Medium Medium N/A Exudate A mount: Serosanguineous Serosanguineous N/A Exudate Type: red, brown red, brown N/A Exudate Color: Distinct, outline attached Thickened N/A Wound Margin: Large (67-100%) Large (67-100%) N/A Granulation A mount: Red, Pink Pink  N/A Granulation Quality: Small (1-33%) Small (1-33%) N/A Necrotic A mount: Fat Layer (Subcutaneous Tissue): Yes Fat Layer (Subcutaneous Tissue): Yes N/A Exposed Structures: Fascia: No Fascia: No Tendon: No Tendon: No Muscle: No Muscle: No Joint: No Joint: No Bone: No Bone: No Small (1-33%) None N/A Epithelialization: N/A Chemical/Enzymatic/Mechanical N/A Debridement: N/A N/A N/A Instrument: N/A None N/A Bleeding: Debridement Treatment Response: N/A Procedure was tolerated well N/A Post Debridement Measurements L x N/A 1.2x0.9x0.1 N/A W x D (cm) N/A 0.085 N/A Post Debridement Volume: (cm) Excoriation: No Callus: Yes N/A Periwound Skin Texture: Induration: No Excoriation: No Callus: No Induration: No Crepitus: No Crepitus: No Rash: No Rash: No Scarring: No Scarring: No Maceration: No Dry/Scaly: Yes N/A Periwound Skin Moisture: Dry/Scaly: No Maceration: No Erythema: Yes Atrophie Blanche: No N/A Periwound Skin Color: Atrophie Blanche: No Cyanosis: No Cyanosis: No Ecchymosis: No Ecchymosis: No Erythema: No Hemosiderin Staining: No Hemosiderin Staining: No Mottled: No Mottled: No Pallor: No Pallor: No Rubor: No Rubor: No Circumferential N/A N/A Erythema Location: Marked N/A N/A Erythema Measurement: No Change N/A N/A Erythema Change: No Abnormality No Abnormality N/A Temperature: N/A Debridement N/A Procedures Performed: Treatment Notes Wound #4 (Ankle) Wound Laterality: Right, Lateral Cleanser Carignan, [name] ([id number]) [id number] Page 4 of 11 Vashe 5.8 (oz) Discharge Instruction: Cleanse the wound with Vashe prior to applying a clean dressing using gauze sponges, not tissue or cotton balls. Peri-Wound Care Topical Mupirocin Ointment Discharge Instruction: Apply Mupirocin (Bactroban) as instructed Primary Dressing Maxorb Extra Ag+ Alginate Dressing, 2x2 (in/in) Discharge Instruction: Apply to wound bed as  instructed Secondary Dressing Woven Gauze Sponges 2x2 in Discharge Instruction: Apply over primary dressing as directed. Zetuvit Plus Silicone Border Dressing 4x4 (in/in) Discharge Instruction: Apply silicone border over primary dressing as directed. Tegaderm Absorbent Dressing, Square, 7.87 x 8 (in/in) Secured With Compression Wrap Compression Stockings Add-Ons Wound #5 (Hand - 1st Digit) Wound Laterality: Plantar, Right Cleanser Vashe 5.8 (oz) Discharge Instruction: Cleanse the wound with Vashe prior to applying a clean dressing using gauze sponges, not tissue or cotton balls. Peri-Wound Care Topical Santyl Collagenase Ointment, 30 (gm), tube Primary Dressing Hydrofera Blue Ready Transfer Foam, 2.5x2.5 (in/in) Discharge Instruction: Apply directly to wound bed as directed Secondary Dressing Woven Gauze Sponges 2x2 in Discharge Instruction: Apply over primary dressing as directed. Secured With 1M Medipore H Soft Cloth Surgical T ape, 4 x 10 (in/yd) Discharge Instruction: Secure with tape as directed. Compression Wrap Compression Stockings Add-Ons Electronic Signature(s) Signed: 03/29/2023 5:24:09 PM By: Baltazar Najjar MD Entered By: Baltazar Najjar on 03/29/2023 09:02:29 -------------------------------------------------------------------------------- Multi-Disciplinary Care Plan Details Patient Name: Date of  Service: [name]. 03/29/2023 8:00 A M Medical Record Number: [id number] Patient Account Number: [id number] Date of Birth/Sex: Treating RN: 1965-12-12 (57 y.o. [name] Primary Care Ruffus Kamaka: Antony Haste Other Clinician: Referring Reka Wist: Treating Adianna Darwin/Extender: Tyresa, Voorhe[place], Garald Braver ([id number]) [id number] Page 5 of 11 Weeks in Treatment: 18 Active Inactive Necrotic Tissue Nursing Diagnoses: Knowledge deficit related to management of necrotic/devitalized  tissue Goals: Necrotic/devitalized tissue will be minimized in the wound bed Date Initiated: 11/23/2022 Target Resolution Date: 04/27/2023 Goal Status: Active Patient/caregiver will verbalize understanding of reason and process for debridement of necrotic tissue Date Initiated: 11/23/2022 Target Resolution Date: 04/27/2023 Goal Status: Active Interventions: Assess patient pain level pre-, during and post procedure and prior to discharge Provide education on necrotic tissue and debridement process Treatment Activities: Biologic debridement : 11/23/2022 Excisional debridement : 11/23/2022 Notes: Nutrition Nursing Diagnoses: Potential for alteratiion in Nutrition/Potential for imbalanced nutrition Goals: Patient/caregiver agrees to and verbalizes understanding of need to obtain nutritional consultation Date Initiated: 11/23/2022 Date Inactivated: 12/22/2022 Target Resolution Date: 12/25/2022 Goal Status: Met Patient/caregiver will maintain therapeutic glucose control Date Initiated: 11/23/2022 Target Resolution Date: 04/27/2023 Goal Status: Active Interventions: Assess HgA1c results as ordered upon admission and as needed Provide education on elevated blood sugars and impact on wound healing Provide education on nutrition Treatment Activities: Obtain HgA1c : 11/23/2022 Patient referred to Primary Care Physician for further nutritional evaluation : 11/23/2022 Notes: Pain, Acute or Chronic Nursing Diagnoses: Potential alteration in comfort, pain Goals: Patient will verbalize adequate pain control and receive pain control interventions during procedures as needed Date Initiated: 11/23/2022 Target Resolution Date: 04/27/2023 Goal Status: Active Patient/caregiver will verbalize comfort level met Date Initiated: 11/23/2022 Target Resolution Date: 04/27/2023 Goal Status: Active Interventions: Encourage patient to take pain medications as prescribed Provide education on pain  management Reposition patient for comfort Treatment Activities: Administer pain control measures as ordered : 11/23/2022 Notes: Wound/Skin Impairment Batalla, Esmirna Park ([id number]) [phone].pdf Page 6 of 11 Nursing Diagnoses: Knowledge deficit related to ulceration/compromised skin integrity Goals: Ulcer/skin breakdown will heal within 14 weeks Date Initiated: 11/23/2022 Target Resolution Date: 04/27/2023 Goal Status: Active Interventions: Assess patient/caregiver ability to perform ulcer/skin care regimen upon admission and as needed Assess ulceration(s) every visit Provide education on ulcer and skin care Treatment Activities: Skin care regimen initiated : 11/23/2022 Topical wound management initiated : 11/23/2022 Notes: Electronic Signature(s) Signed: 03/29/2023 5:10:34 PM By: Shawn Stall RN, BSN Entered By: Shawn Stall on 03/29/2023 08:20:40 -------------------------------------------------------------------------------- Pain Assessment Details Patient Name: Date of Service: [name]. 03/29/2023 8:00 A M Medical Record Number: [id number] Patient Account Number: [id number] Date of Birth/Sex: Treating RN: Jul 16, 1965 (57 y.o. [name] Primary Care Shanea Karney: Antony Haste Other Clinician: Referring Zaviyar Rahal: Treating Lorane Cousar/Extender: Murtis Sink in Treatment: 18 Active Problems Location of Pain Severity and Description of Pain Patient Has Paino Yes Site Locations Pain Location: Pain in Ulcers Rate the pain. Current Pain Level: 7 Worst Pain Level: 10 Least Pain Level: 1 Tolerable Pain Level: 8 Character of Pain Describe the Pain: Burning Pain Management and Medication Current Pain Management: Medication: No Cold Application: No Rest: No Massage: No Activity: No T.E.N.S.: No Heat Application: No Leg drop or elevation: No Is the Current Pain Management Adequate: Adequate [name], [name]  ([id number]) [id number] Page 7 of 11 How does your wound impact your activities of daily livingo Sleep: No Bathing: No Appetite: No Relationship With Others: No Bladder Continence: No Emotions: No  Bowel Continence: No Work: No Toileting: No Drive: No Dressing: No Hobbies: No Psychologist, prison and probation services) Signed: 03/29/2023 5:10:34 PM By: Shawn Stall RN, BSN Entered By: Shawn Stall on 03/29/2023 08:06:50 -------------------------------------------------------------------------------- Patient/Caregiver Education Details Patient Name: Date of Service: [name]. 12/2/2024andnbsp8:00 A M Medical Record Number: [id number] Patient Account Number: [id number] Date of Birth/Gender: Treating RN: August 22, 1965 (57 y.o. [name] Primary Care Physician: Antony Haste Other Clinician: Referring Physician: Treating Physician/Extender: Murtis Sink in Treatment: 18 Education Assessment Education Provided To: Patient Education Topics Provided Wound/Skin Impairment: Handouts: Caring for Your Ulcer Methods: Explain/Verbal Responses: Reinforcements needed Electronic Signature(s) Signed: 03/29/2023 5:10:34 PM By: Shawn Stall RN, BSN Entered By: Shawn Stall on 03/29/2023 08:20:54 -------------------------------------------------------------------------------- Wound Assessment Details Patient Name: Date of Service: [name]. 03/29/2023 8:00 A M Medical Record Number: [id number] Patient Account Number: [id number] Date of Birth/Sex: Treating RN: January 01, 1966 (57 y.o. [name] Primary Care Geana Walts: Antony Haste Other Clinician: Referring Knight Oelkers: Treating Dalayah Deahl/Extender: Murtis Sink in Treatment: 18 Wound Status Wound Number: 4 Primary Diabetic Wound/Ulcer of the Lower Extremity Etiology: Wound Location: Right, Lateral Ankle Wound Open Wounding Event:  Trauma Status: Date Acquired: 07/27/2022 Comorbid Anemia, Sleep Apnea, Type II Diabetes, Gout, Osteoarthritis, Weeks Of Treatment: 18 History: Osteomyelitis, Neuropathy Clustered Wound: No Pending Amputation On Presentation Photos [name], [name] ([id number]) [phone].pdf Page 8 of 11 Wound Measurements Length: (cm) 1.9 Width: (cm) 1 Depth: (cm) 0.1 Area: (cm) 1.492 Volume: (cm) 0.149 % Reduction in Area: 48.4% % Reduction in Volume: 74.2% Epithelialization: Small (1-33%) Tunneling: No Undermining: No Wound Description Classification: Grade 1 Wound Margin: Distinct, outline attached Exudate Amount: Medium Exudate Type: Serosanguineous Exudate Color: red, brown Foul Odor After Cleansing: No Slough/Fibrino Yes Wound Bed Granulation Amount: Large (67-100%) Exposed Structure Granulation Quality: Red, Pink Fascia Exposed: No Necrotic Amount: Small (1-33%) Fat Layer (Subcutaneous Tissue) Exposed: Yes Necrotic Quality: Adherent Slough Tendon Exposed: No Muscle Exposed: No Joint Exposed: No Bone Exposed: No Periwound Skin Texture Texture Color No Abnormalities Noted: No No Abnormalities Noted: No Callus: No Atrophie Blanche: No Crepitus: No Cyanosis: No Excoriation: No Ecchymosis: No Induration: No Erythema: Yes Rash: No Erythema Location: Circumferential Scarring: No Erythema Measurement: Marked Erythema Change: No Change Moisture Hemosiderin Staining: No No Abnormalities Noted: No Mottled: No Dry / Scaly: No Pallor: No Maceration: No Rubor: No Temperature / Pain Temperature: No Abnormality Treatment Notes Wound #4 (Ankle) Wound Laterality: Right, Lateral Cleanser Vashe 5.8 (oz) Discharge Instruction: Cleanse the wound with Vashe prior to applying a clean dressing using gauze sponges, not tissue or cotton balls. Peri-Wound Care Topical Mupirocin Ointment Discharge Instruction: Apply Mupirocin (Bactroban) as  instructed Primary Dressing Maxorb Extra Ag+ Alginate Dressing, 2x2 (in/in) Discharge Instruction: Apply to wound bed as instructed Secondary Dressing Woven Gauze Sponges 2x2 in Discharge Instruction: Apply over primary dressing as directed. [name], [name] ([id number]) [id number] Page 9 of 11 Zetuvit Plus Silicone Border Dressing 4x4 (in/in) Discharge Instruction: Apply silicone border over primary dressing as directed. Tegaderm Absorbent Dressing, Square, 7.87 x 8 (in/in) Secured With Compression Wrap Compression Stockings Facilities manager) Signed: 03/29/2023 5:10:34 PM By: Shawn Stall RN, BSN Entered By: Shawn Stall on 03/29/2023 08:15:27 -------------------------------------------------------------------------------- Wound Assessment Details Patient Name: Date of Service: [name]. 03/29/2023 8:00 A M Medical Record Number: [id number] Patient Account Number: [id number] Date of Birth/Sex: Treating RN: 04-13-1966 (57 y.o. [name] Primary Care Suhana Wilner: Antony Haste Other Clinician: Referring  Mikiyah Glasner: Treating Keyion Knack/Extender: Murtis Sink in Treatment: 18 Wound Status Wound Number: 5 Primary 2nd degree Burn Etiology: Wound Location: Right, Plantar Hand - 1st Digit Wound Open Wounding Event: Thermal Burn Status: Date Acquired: 12/28/2022 Comorbid Anemia, Sleep Apnea, Type II Diabetes, Gout, Osteoarthritis, Weeks Of Treatment: 3 History: Osteomyelitis, Neuropathy Clustered Wound: No Photos Wound Measurements Length: (cm) 1.2 Width: (cm) 0.9 Depth: (cm) 0.1 Area: (cm) 0.848 Volume: (cm) 0.085 % Reduction in Area: 57.7% % Reduction in Volume: 57.5% Epithelialization: None Tunneling: No Undermining: No Wound Description Classification: Full Thickness Without Exposed Support Structures Wound Margin: Thickened Exudate Amount: Medium Exudate Type: Serosanguineous Exudate  Color: red, brown Foul Odor After Cleansing: No Slough/Fibrino Yes Wound Bed Granulation Amount: Large (67-100%) Exposed Structure Granulation Quality: Pink Fascia Exposed: No Necrotic Amount: Small (1-33%) Fat Layer (Subcutaneous Tissue) Exposed: Yes Necrotic Quality: Adherent Slough Tendon Exposed: No [name], [name] ([id number]) [id number] Page 10 of 11 Muscle Exposed: No Joint Exposed: No Bone Exposed: No Periwound Skin Texture Texture Color No Abnormalities Noted: No No Abnormalities Noted: No Callus: Yes Atrophie Blanche: No Crepitus: No Cyanosis: No Excoriation: No Ecchymosis: No Induration: No Erythema: No Rash: No Hemosiderin Staining: No Scarring: No Mottled: No Pallor: No Moisture Rubor: No No Abnormalities Noted: No Dry / Scaly: Yes Temperature / Pain Maceration: No Temperature: No Abnormality Treatment Notes Wound #5 (Hand - 1st Digit) Wound Laterality: Plantar, Right Cleanser Vashe 5.8 (oz) Discharge Instruction: Cleanse the wound with Vashe prior to applying a clean dressing using gauze sponges, not tissue or cotton balls. Peri-Wound Care Topical Santyl Collagenase Ointment, 30 (gm), tube Primary Dressing Hydrofera Blue Ready Transfer Foam, 2.5x2.5 (in/in) Discharge Instruction: Apply directly to wound bed as directed Secondary Dressing Woven Gauze Sponges 2x2 in Discharge Instruction: Apply over primary dressing as directed. Secured With 34M Medipore H Soft Cloth Surgical T ape, 4 x 10 (in/yd) Discharge Instruction: Secure with tape as directed. Compression Wrap Compression Stockings Add-Ons Electronic Signature(s) Signed: 03/29/2023 5:10:34 PM By: Shawn Stall RN, BSN Entered By: Shawn Stall on 03/29/2023 08:17:15 -------------------------------------------------------------------------------- Vitals Details Patient Name: Date of Service: [name]. 03/29/2023 8:00 A M Medical Record Number:  [id number] Patient Account Number: [id number] Date of Birth/Sex: Treating RN: October 28, 1965 (57 y.o. [name], Millard.Loa Primary Care Demarrion Meiklejohn: Antony Haste Other Clinician: Referring Xavi Tomasik: Treating Hermena Swint/Extender: Murtis Sink in Treatment: 18 Vital Signs Time Taken: 08:05 Temperature (F): 98.6 Height (in): 64 Pulse (bpm): 82 Weight (lbs): 261 Respiratory Rate (breaths/min): 20 Body Mass Index (BMI): 44.8 Blood Pressure (mmHg): 128/77 [name], [name] ([id number]) [id number] Page 11 of 11 Capillary Blood Glucose (mg/dl): 628 Reference Range: 80 - 120 mg / dl Electronic Signature(s) Signed: 03/29/2023 5:10:34 PM By: Shawn Stall RN, BSN Entered By: Shawn Stall on 03/29/2023 08:06:31

## 2023-03-31 ENCOUNTER — Telehealth: Payer: Self-pay | Admitting: Infectious Diseases

## 2023-03-31 NOTE — Telephone Encounter (Signed)
Called [name] about her IgG levels (normal) Left VM.

## 2023-04-05 ENCOUNTER — Encounter (HOSPITAL_BASED_OUTPATIENT_CLINIC_OR_DEPARTMENT_OTHER): Payer: Commercial Managed Care - HMO | Admitting: Internal Medicine

## 2023-04-05 DIAGNOSIS — E11621 Type 2 diabetes mellitus with foot ulcer: Secondary | ICD-10-CM | POA: Diagnosis not present

## 2023-04-05 NOTE — Progress Notes (Signed)
JACQUISE, GAGE R ([id number]) [id number] Page 1 of 10 Visit Report for 04/05/2023 Arrival Information Details Patient Name: Date of Service: [name] Draft NDRA R. 04/05/2023 3:30 PM Medical Record Number: [id number] Patient Account Number: [id number] Date of Birth/Sex: Treating RN: [birth year] (57 y.o. F) Primary Care Rahel Carlton: Antony Haste Other Clinician: Referring Chrystle Murillo: Treating Jearline Hirschhorn/Extender: Murtis Sink in Treatment: 19 Visit Information History Since Last Visit Added or deleted any medications: No Patient Arrived: Ambulatory Any new allergies or adverse reactions: No Arrival Time: 15:34 Had a fall or experienced change in No Accompanied By: husband activities of daily living that may affect Transfer Assistance: None risk of falls: Patient Identification Verified: Yes Signs or symptoms of abuse/neglect since last visito No Secondary Verification Process Completed: Yes Hospitalized since last visit: No Patient Requires Transmission-Based Precautions: No Implantable device outside of the clinic excluding No Patient Has Alerts: No cellular tissue based products placed in the center since last visit: Pain Present Now: Yes Electronic Signature(s) Signed: 04/05/2023 4:28:13 PM By: Dayton Scrape Entered By: Dayton Scrape on 04/05/2023 15:35:19 -------------------------------------------------------------------------------- Encounter Discharge Information Details Patient Name: Date of Service: [name] Draft NDRA R. 04/05/2023 3:30 PM Medical Record Number: [id number] Patient Account Number: [id number] Date of Birth/Sex: Treating RN: [birth year] (57 y.o. [name] Primary Care Nehal Shives: Antony Haste Other Clinician: Referring Jorja Empie: Treating Kanani Mowbray/Extender: Murtis Sink in Treatment: 19 Encounter Discharge Information Items Discharge Condition: Stable Ambulatory Status:  Ambulatory Discharge Destination: Home Transportation: Private Auto Accompanied By: husband Schedule Follow-up Appointment: Yes Clinical Summary of Care: Electronic Signature(s) Signed: 04/05/2023 4:19:11 PM By: Shawn Stall RN, BSN Entered By: Shawn Stall on 04/05/2023 16:07:09 Housley, Adley R ([id number]) [id number] Page 2 of 10 -------------------------------------------------------------------------------- Lower Extremity Assessment Details Patient Name: Date of Service: [name] Draft NDRA R. 04/05/2023 3:30 PM Medical Record Number: [id number] Patient Account Number: [id number] Date of Birth/Sex: Treating RN: 1965/11/15 (57 y.o. [name] Primary Care Yitta Gongaware: Antony Haste Other Clinician: Referring Cassity Christian: Treating Arren Laminack/Extender: Murtis Sink in Treatment: 19 Edema Assessment Assessed: [name]: No] [name]: Yes] Edema: [Left: N] [Right: o] Calf Left: Right: Point of Measurement: 34.5 cm From Medial Instep 41 cm Ankle Left: Right: Point of Measurement: 12.5 cm From Medial Instep 24 cm Vascular Assessment Pulses: Dorsalis Pedis Palpable: [Right:Yes] Extremity colors, hair growth, and conditions: Extremity Color: [Right:Normal] Hair Growth on Extremity: [Right:No] Temperature of Extremity: [Right:Warm] Capillary Refill: [Right:< 3 seconds] Dependent Rubor: [Right:No No] Toe Nail Assessment Left: Right: Thick: No Discolored: No Deformed: No Improper Length and Hygiene: No Electronic Signature(s) Signed: 04/05/2023 4:19:11 PM By: Shawn Stall RN, BSN Entered By: Shawn Stall on 04/05/2023 15:51:11 -------------------------------------------------------------------------------- Multi Wound Chart Details Patient Name: Date of Service: [name] Draft NDRA R. 04/05/2023 3:30 PM Medical Record Number: [id number] Patient Account Number: [id number] Date of Birth/Sex: Treating RN: 10/17/1965 (57 y.o.  F) Primary Care Saniyyah Elster: Antony Haste Other Clinician: Referring Geovana Gebel: Treating Bobette Leyh/Extender: Murtis Sink in Treatment: 19 Vital Signs Height(in): 64 Capillary Blood Glucose(mg/dl): 416 Weight(lbs): 606 Pulse(bpm): 90 Body Mass Index(BMI): 44.8 Blood Pressure(mmHg): 111/61 Park, [name] R ([id number]) [id number] Page 3 of 10 Temperature(F): 98.6 Respiratory Rate(breaths/min): 20 [4:Photos:] [N/A:N/A] Right, Lateral Ankle Right, Plantar Hand - 1st Digit N/A Wound Location: Trauma Thermal Burn N/A Wounding Event: Diabetic Wound/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 Burn N/A Primary Etiology: Extremity Anemia, Sleep Apnea, Type II Anemia, Sleep Apnea, Type II N/A Comorbid History: Diabetes, Gout, Osteoarthritis, Diabetes, Gout,  Osteoarthritis, Osteomyelitis, Neuropathy Osteomyelitis, Neuropathy 07/27/2022 12/28/2022 N/A Date Acquired: 19 4 N/A Weeks of Treatment: Open Open N/A Wound Status: No No N/A Wound Recurrence: Yes No N/A Pending A mputation on Presentation: 0.2x1.2x0.1 1.3x0.9x0.1 N/A Measurements L x W x D (cm) 0.188 0.919 N/A A (cm) : rea 0.019 0.092 N/A Volume (cm) : 93.50% 54.10% N/A % Reduction in A rea: 96.70% 54.00% N/A % Reduction in Volume: 10 Starting Position 1 (o'clock): 2 Ending Position 1 (o'clock): 0.4 Maximum Distance 1 (cm): No Yes N/A Undermining: Grade 1 Full Thickness Without Exposed N/A Classification: Support Structures Medium Medium N/A Exudate Amount: Serosanguineous Serosanguineous N/A Exudate Type: red, brown red, brown N/A Exudate Color: Distinct, outline attached Thickened N/A Wound Margin: Large (67-100%) Large (67-100%) N/A Granulation Amount: Red, Pink Pink N/A Granulation Quality: None Present (0%) Small (1-33%) N/A Necrotic Amount: Fat Layer (Subcutaneous Tissue): Yes Fat Layer (Subcutaneous Tissue): Yes N/A Exposed Structures: Fascia: No Fascia:  No Tendon: No Tendon: No Muscle: No Muscle: No Joint: No Joint: No Bone: No Bone: No Large (67-100%) None N/A Epithelialization: Excoriation: No Callus: Yes N/A Periwound Skin Texture: Induration: No Excoriation: No Callus: No Induration: No Crepitus: No Crepitus: No Rash: No Rash: No Scarring: No Scarring: No Maceration: No Dry/Scaly: Yes N/A Periwound Skin Moisture: Dry/Scaly: No Maceration: No Atrophie Blanche: No Atrophie Blanche: No N/A Periwound Skin Color: Cyanosis: No Cyanosis: No Ecchymosis: No Ecchymosis: No Erythema: No Erythema: No Hemosiderin Staining: No Hemosiderin Staining: No Mottled: No Mottled: No Pallor: No Pallor: No Rubor: No Rubor: No No Abnormality No Abnormality N/A Temperature: N/A Dressings and/or debridement of N/A Procedures Performed: burns; small Treatment Notes Wound #4 (Ankle) Wound Laterality: Right, Lateral Cleanser Vashe 5.8 (oz) Discharge Instruction: Cleanse the wound with Vashe prior to applying a clean dressing using gauze sponges, not tissue or cotton balls. Peri-Wound Care Topical Langstaff, Jeraldin R ([id number]) [id number] Page 4 of 10 Mupirocin Ointment Discharge Instruction: Apply Mupirocin (Bactroban) as instructed Primary Dressing Maxorb Extra Ag+ Alginate Dressing, 2x2 (in/in) Discharge Instruction: Apply to wound bed as instructed Secondary Dressing Woven Gauze Sponges 2x2 in Discharge Instruction: Apply over primary dressing as directed. Zetuvit Plus Silicone Border Dressing 4x4 (in/in) Discharge Instruction: Apply silicone border over primary dressing as directed. Tegaderm Absorbent Dressing, Square, 7.87 x 8 (in/in) Secured With Compression Wrap Compression Stockings Add-Ons Wound #5 (Hand - 1st Digit) Wound Laterality: Plantar, Right Cleanser Vashe 5.8 (oz) Discharge Instruction: Cleanse the wound with Vashe prior to applying a clean dressing using gauze sponges,  not tissue or cotton balls. Peri-Wound Care Topical Mupirocin Ointment Discharge Instruction: Apply Mupirocin (Bactroban) as instructed Primary Dressing Maxorb Extra Ag+ Alginate Dressing, 2x2 (in/in) Discharge Instruction: Apply to wound bed as instructed Secondary Dressing Woven Gauze Sponges 2x2 in Discharge Instruction: Apply over primary dressing as directed. Secured With 67M Medipore H Soft Cloth Surgical T ape, 4 x 10 (in/yd) Discharge Instruction: Secure with tape as directed. Compression Wrap Compression Stockings Add-Ons Electronic Signature(s) Signed: 04/05/2023 4:43:54 PM By: Baltazar Najjar MD Entered By: Baltazar Najjar on 04/05/2023 16:35:08 -------------------------------------------------------------------------------- Multi-Disciplinary Care Plan Details Patient Name: Date of Service: [name] Draft NDRA R. 04/05/2023 3:30 PM Medical Record Number: [id number] Patient Account Number: [id number] Date of Birth/Sex: Treating RN: April 03, 1966 (57 y.o. [name] Primary Care Jasmaine Rochel: Antony Haste Other Clinician: Referring Merl Bommarito: Treating Atif Chapple/Extender: Murtis Sink in Treatment: [address] [place] R ([id number]) [id number] Page 5 of 10 Active Inactive Necrotic Tissue Nursing Diagnoses: Knowledge deficit related to management  of necrotic/devitalized tissue Goals: Necrotic/devitalized tissue will be minimized in the wound bed Date Initiated: 11/23/2022 Target Resolution Date: 04/27/2023 Goal Status: Active Patient/caregiver will verbalize understanding of reason and process for debridement of necrotic tissue Date Initiated: 11/23/2022 Target Resolution Date: 04/27/2023 Goal Status: Active Interventions: Assess patient pain level pre-, during and post procedure and prior to discharge Provide education on necrotic tissue and debridement process Treatment Activities: Biologic debridement :  11/23/2022 Excisional debridement : 11/23/2022 Notes: Nutrition Nursing Diagnoses: Potential for alteratiion in Nutrition/Potential for imbalanced nutrition Goals: Patient/caregiver agrees to and verbalizes understanding of need to obtain nutritional consultation Date Initiated: 11/23/2022 Date Inactivated: 12/22/2022 Target Resolution Date: 12/25/2022 Goal Status: Met Patient/caregiver will maintain therapeutic glucose control Date Initiated: 11/23/2022 Target Resolution Date: 04/27/2023 Goal Status: Active Interventions: Assess HgA1c results as ordered upon admission and as needed Provide education on elevated blood sugars and impact on wound healing Provide education on nutrition Treatment Activities: Obtain HgA1c : 11/23/2022 Patient referred to Primary Care Physician for further nutritional evaluation : 11/23/2022 Notes: Wound/Skin Impairment Nursing Diagnoses: Knowledge deficit related to ulceration/compromised skin integrity Goals: Ulcer/skin breakdown will heal within 14 weeks Date Initiated: 11/23/2022 Target Resolution Date: 04/27/2023 Goal Status: Active Interventions: Assess patient/caregiver ability to perform ulcer/skin care regimen upon admission and as needed Assess ulceration(s) every visit Provide education on ulcer and skin care Treatment Activities: Skin care regimen initiated : 11/23/2022 Topical wound management initiated : 11/23/2022 Notes: Electronic Signature(s) Signed: 04/05/2023 4:19:11 PM By: Shawn Stall RN, BSN Entered By: Shawn Stall on 04/05/2023 15:52:19 Casserly, Makaya R ([id number]) [id number] Page 6 of 10 -------------------------------------------------------------------------------- Pain Assessment Details Patient Name: Date of Service: [name] Draft NDRA R. 04/05/2023 3:30 PM Medical Record Number: [id number] Patient Account Number: [id number] Date of Birth/Sex: Treating RN: Nov 26, 1965 (57 y.o. F) Primary Care  Lashun Ramseyer: Antony Haste Other Clinician: Referring Daksh Coates: Treating Santresa Levett/Extender: Murtis Sink in Treatment: 19 Active Problems Location of Pain Severity and Description of Pain Patient Has Paino Yes Site Locations Rate the pain. Current Pain Level: 6 Worst Pain Level: 10 Least Pain Level: 0 Tolerable Pain Level: 2 Pain Management and Medication Current Pain Management: Electronic Signature(s) Signed: 04/05/2023 4:28:13 PM By: Dayton Scrape Entered By: Dayton Scrape on 04/05/2023 15:35:57 -------------------------------------------------------------------------------- Patient/Caregiver Education Details Patient Name: Date of Service: [name] Draft NDRA R. 12/9/2024andnbsp3:30 PM Medical Record Number: [id number] Patient Account Number: [id number] Date of Birth/Gender: Treating RN: 24-Aug-1965 (57 y.o. [name] Primary Care Physician: Antony Haste Other Clinician: Referring Physician: Treating Physician/Extender: Murtis Sink in Treatment: 19 Education Assessment Education Provided To: Patient Education Topics Provided Wound/Skin Impairment: Handouts: Caring for Your Ulcer Methods: Explain/Verbal Responses: Reinforcements needed ROSETTA, NEVERSON R ([id number]) [id number] Page 7 of 10 Electronic Signature(s) Signed: 04/05/2023 4:19:11 PM By: Shawn Stall RN, BSN Entered By: Shawn Stall on 04/05/2023 15:52:33 -------------------------------------------------------------------------------- Wound Assessment Details Patient Name: Date of Service: [name] NDRA R. 04/05/2023 3:30 PM Medical Record Number: [id number] Patient Account Number: [id number] Date of Birth/Sex: Treating RN: [birth year] (57 y.o. F) Primary Care Avamae Dehaan: Antony Haste Other Clinician: Referring Omari Mcmanaway: Treating Elizandro Laura/Extender: Murtis Sink in Treatment: 19 Wound  Status Wound Number: 4 Primary Diabetic Wound/Ulcer of the Lower Extremity Etiology: Wound Location: Right, Lateral Ankle Wound Open Wounding Event: Trauma Status: Date Acquired: 07/27/2022 Comorbid Anemia, Sleep Apnea, Type II Diabetes, Gout, Osteoarthritis, Weeks Of Treatment: 19 History: Osteomyelitis, Neuropathy Clustered Wound: No Pending Amputation On Presentation Photos Wound Measurements Length: (cm)  0.2 Width: (cm) 1.2 Depth: (cm) 0.1 Area: (cm) 0.188 Volume: (cm) 0.019 % Reduction in Area: 93.5% % Reduction in Volume: 96.7% Epithelialization: Large (67-100%) Tunneling: No Undermining: No Wound Description Classification: Grade 1 Wound Margin: Distinct, outline attached Exudate Amount: Medium Exudate Type: Serosanguineous Exudate Color: red, brown Foul Odor After Cleansing: No Slough/Fibrino No Wound Bed Granulation Amount: Large (67-100%) Exposed Structure Granulation Quality: Red, Pink Fascia Exposed: No Necrotic Amount: None Present (0%) Fat Layer (Subcutaneous Tissue) Exposed: Yes Tendon Exposed: No Muscle Exposed: No Joint Exposed: No Bone Exposed: No Periwound Skin Texture Texture Color No Abnormalities Noted: No No Abnormalities Noted: No Callus: No Atrophie Blanche: No Smaldone, Poetry R ([id number]) [id number] Page 8 of 10 Crepitus: No Cyanosis: No Excoriation: No Ecchymosis: No Induration: No Erythema: No Rash: No Hemosiderin Staining: No Scarring: No Mottled: No Pallor: No Moisture Rubor: No No Abnormalities Noted: No Dry / Scaly: No Temperature / Pain Maceration: No Temperature: No Abnormality Treatment Notes Wound #4 (Ankle) Wound Laterality: Right, Lateral Cleanser Vashe 5.8 (oz) Discharge Instruction: Cleanse the wound with Vashe prior to applying a clean dressing using gauze sponges, not tissue or cotton balls. Peri-Wound Care Topical Mupirocin Ointment Discharge Instruction: Apply Mupirocin  (Bactroban) as instructed Primary Dressing Maxorb Extra Ag+ Alginate Dressing, 2x2 (in/in) Discharge Instruction: Apply to wound bed as instructed Secondary Dressing Woven Gauze Sponges 2x2 in Discharge Instruction: Apply over primary dressing as directed. Zetuvit Plus Silicone Border Dressing 4x4 (in/in) Discharge Instruction: Apply silicone border over primary dressing as directed. Tegaderm Absorbent Dressing, Square, 7.87 x 8 (in/in) Secured With Compression Wrap Compression Stockings Facilities manager) Signed: 04/05/2023 4:19:11 PM By: Shawn Stall RN, BSN Entered By: Shawn Stall on 04/05/2023 15:51:26 -------------------------------------------------------------------------------- Wound Assessment Details Patient Name: Date of Service: [name] NDRA R. 04/05/2023 3:30 PM Medical Record Number: [id number] Patient Account Number: [id number] Date of Birth/Sex: Treating RN: Oct 18, 1965 (57 y.o. F) Primary Care Radley Teston: Antony Haste Other Clinician: Referring Kenlei Safi: Treating Aloise Copus/Extender: Murtis Sink in Treatment: 19 Wound Status Wound Number: 5 Primary 2nd degree Burn Etiology: Wound Location: Right, Plantar Hand - 1st Digit Wound Open Wounding Event: Thermal Burn Status: Date Acquired: 12/28/2022 Comorbid Anemia, Sleep Apnea, Type II Diabetes, Gout, Osteoarthritis, Weeks Of Treatment: 4 History: Osteomyelitis, Neuropathy Clustered Wound: No Photos Shukla, Donnah R ([id number]) [id number] Page 9 of 10 Wound Measurements Length: (cm) 1.3 Width: (cm) 0.9 Depth: (cm) 0.1 Area: (cm) 0.919 Volume: (cm) 0.092 % Reduction in Area: 54.1% % Reduction in Volume: 54% Epithelialization: None Tunneling: No Undermining: Yes Starting Position (o'clock): 10 Ending Position (o'clock): 2 Maximum Distance: (cm) 0.4 Wound Description Classification: Full Thickness Without Exposed Support  Structures Wound Margin: Thickened Exudate Amount: Medium Exudate Type: Serosanguineous Exudate Color: red, brown Foul Odor After Cleansing: No Slough/Fibrino Yes Wound Bed Granulation Amount: Large (67-100%) Exposed Structure Granulation Quality: Pink Fascia Exposed: No Necrotic Amount: Small (1-33%) Fat Layer (Subcutaneous Tissue) Exposed: Yes Necrotic Quality: Adherent Slough Tendon Exposed: No Muscle Exposed: No Joint Exposed: No Bone Exposed: No Periwound Skin Texture Texture Color No Abnormalities Noted: No No Abnormalities Noted: No Callus: Yes Atrophie Blanche: No Crepitus: No Cyanosis: No Excoriation: No Ecchymosis: No Induration: No Erythema: No Rash: No Hemosiderin Staining: No Scarring: No Mottled: No Pallor: No Moisture Rubor: No No Abnormalities Noted: No Dry / Scaly: Yes Temperature / Pain Maceration: No Temperature: No Abnormality Treatment Notes Wound #5 (Hand - 1st Digit) Wound Laterality: Plantar, Right Cleanser Vashe 5.8 (oz) Discharge Instruction: Cleanse the wound  with Vashe prior to applying a clean dressing using gauze sponges, not tissue or cotton balls. Peri-Wound Care Topical Mupirocin Ointment Discharge Instruction: Apply Mupirocin (Bactroban) as instructed Primary Dressing Maxorb Extra Ag+ Alginate Dressing, 2x2 (in/in) Discharge Instruction: Apply to wound bed as instructed Secondary Dressing Woven Gauze Sponges 2x2 in Mastro, Elysha R ([id number]) [id number] Page 10 of 10 Discharge Instruction: Apply over primary dressing as directed. Secured With 15M Medipore H Soft Cloth Surgical T ape, 4 x 10 (in/yd) Discharge Instruction: Secure with tape as directed. Compression Wrap Compression Stockings Add-Ons Electronic Signature(s) Signed: 04/05/2023 4:19:11 PM By: Shawn Stall RN, BSN Entered By: Shawn Stall on 04/05/2023  15:51:51 -------------------------------------------------------------------------------- Vitals Details Patient Name: Date of Service: [name] Draft NDRA R. 04/05/2023 3:30 PM Medical Record Number: [id number] Patient Account Number: [id number] Date of Birth/Sex: Treating RN: [birth year] (57 y.o. F) Primary Care Iann Rodier: Antony Haste Other Clinician: Referring Demya Scruggs: Treating Stratton Villwock/Extender: Murtis Sink in Treatment: 19 Vital Signs Time Taken: 03:35 Temperature (F): 98.6 Height (in): 64 Pulse (bpm): 90 Weight (lbs): 261 Respiratory Rate (breaths/min): 20 Body Mass Index (BMI): 44.8 Blood Pressure (mmHg): 111/61 Capillary Blood Glucose (mg/dl): 010 Reference Range: 80 - 120 mg / dl Electronic Signature(s) Signed: 04/05/2023 4:28:13 PM By: Dayton Scrape Entered By: Dayton Scrape on 04/05/2023 15:35:42

## 2023-04-05 NOTE — Progress Notes (Signed)
SHEMEKA, [name] ([id number]) [id number] Page 1 of 8 Visit Report for 04/05/2023 HPI Details Patient Name: Date of Service: [name]. 04/05/2023 3:30 PM Medical Record Number: [id number] Patient Account Number: [id number] Date of Birth/Sex: Treating RN: March 24, 1966 (57 y.o. F) Primary Care Provider: Antony Haste Other Clinician: Referring Provider: Treating Provider/Extender: Murtis Sink in Treatment: 19 History of Present Illness HPI Description: ABI on Right: 1.01 Ms. 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 Most recently she had an amputation of the right ring finger. She presents today with a wound to the right lateral ankle that she has had for 3 months that was caused by her hitting the area with her wheelchair. She has been Vashe dressings to clean the wound bed followed by antibiotic ointment. ABIs in office are 1.01 on the left and she has palpable dorsalis pedis and posterior tibial artery pulses. Her foot is warm and well-perfused. 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 She has been cleaning the area with Vashe and using Neosporin to the wound bed. She currently denies signs of infection. Patient has a history of ankle surgery on 04-07-22 with right partial hardware removal and revision subtalar fusion. 8/6; patient presents for follow-up. She has been using Santyl to the wound bed and has been taking Augmentin and doxycycline without issues. She is scheduled for her MRI towards the end of the month. She has no issues or complaints. 8/13; patient presents for follow-up. She is scheduled for MRI next week. She has been using Santyl and Hydrofera Blue to the wound bed. She is taking Augmentin and doxycycline without issues. She has been on her feet more  recently and has noted slightly more swelling to the ankle and more erythema. She denies systemic signs of infection. 8/27; patient presents for follow-up. She is completed 4 weeks of oral antibiotics. She obtained her MRI of the right ankle however this has not been read yet. She denies signs of infection. Wound is smaller. She has been using Santyl and Hydrofera Blue. 9/3; patient presents for follow-up. She had an MRI completed on 8/27. Reading shows no definitive active osteomyelitis to the wound over the lateral ankle. It did notice partial tearing of the peroneus brevis and longus, partial tearing of the distal Achilles tendon and partial tearing of the tibialis posterior tendon. She has an appointment with her podiatrist based on these findings next week. She is been using Santyl and Hydrofera Blue to the wound bed. Along with her compression stockings. Wound is smaller. 9/10; patient presents for follow-up. She has been using Santyl and Hydrofera Blue to the wound bed along with her compression stockings daily. Wound is smaller. She has no issues or complaints. Insurance is asking for more recent hemoglobin A1c for potential approval of skin substitute. Patient is going to try and obtain this from her PCP. 9/17; left lateral ankle wound over the lateral malleolus. Everything looks better here healthy granulation we have been using Santyl Hydrofera Blue and border foam she is changing this every second day at home 10/1; this is a patient with a wound over the left lateral ankle/left lateral malleolus. Everything continues to look better. Healthy looking granulation and epithelialization. She has been using Santyl and Hydrofera Blue and border foam her husband changes the dressing every second day  at home. Since she has been here she had surgery on her left hand that is wrapped we did not look at this today 10/22; 3-week hiatus. Area over the left lateral ankle left lateral malleolus. She tells  me that up until 2 or 3 days ago the wound was continuing to get tract and everything was fine. She had a series of appointments which caused her to miss our follow-up. The wound got down to the size of the tip of her little finger then 3 days ago she developed an increase in wound size throbbing pain and erythema around the wound. She is relatively insensate but she still has a deep throbbing discomfort. She had been using Santyl and Hydrofera Blue as the primary dressings. 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 She follows with Dr. Ninetta Lights and is on a trimethoprim/sulfamethoxazole DS 1 tablet daily for MRSA prophylaxis. 11/5; 2-week follow-up. The area on the left lateral ankle I was concerned about last time looks better the erythema has receded the wound is smaller and she is not in any pain. I gave her a 10-day course of Nuzyra which she is just finishing. It took her a while to get this through her insurance had to go through a prior authorization. She has been using Santyl and Hydrofera Blue on this area She comes in today with an area on her palmar surface of her right thumb. Nonviable surface. She says she had this started about 2 months ago when she noticed a blister on her thumb she thinks she burned herself while cooking although she is not certain. She has a history of multiple amputations in her hand apparently secondary to MRSA infections 11/20; 2-week follow-up. The left lateral ankle actually looks quite good. We have been using Hydrofera Blue with underlying Santyl and the wound looks better. I have given her a course of linezolid I believe and the area of infection here looks resolved. She also has a difficult area on the remanent of her right thumb on the plantar aspect of the inner phalangeal joint. She is also been using Santyl and Hydrofera Blue in this area Of course she has a history of recurrent MRSA infections and she is on longstanding prophylaxis with a  single Septra DS daily. 12/2; the patient's right thumb wound actually does not look too bad. There is a rim of epithelialization some callus. We have been using Hydrofera Blue and Santyl. The area on the right lateral ankle however has unchanged appearance. However there is again erythema around this which I have marked. I gave her a course of linezolid for this a few weeks ago which seemed to help. She of course has the disastrous course/history of MRSA for which she is on prophylactic Septra 12/9; the patient's thumb had undermining and some fibrinous debris on the surface required a debridement today. The area on the right lateral ankle much [name], [name] ([id number]) [phone].pdf Page 2 of 8 better than last week. There is no additional erythema around this area at the wound is measuring quite a bit smaller. Electronic Signature(s) Signed: 04/05/2023 4:43:54 PM By: Baltazar Najjar MD Entered By: Baltazar Najjar on 04/05/2023 16:36:21 -------------------------------------------------------------------------------- Dressings and/or debridement of burns; small Details Patient Name: Date of Service: [name] NDRA Park. 04/05/2023 3:30 PM Medical Record Number: [id number] Patient Account Number: [id number] Date of Birth/Sex: Treating RN: [birth year] (57 y.o. F) Primary Care Provider: Antony Haste Other Clinician: Referring Provider: Treating Provider/Extender: Cloria Spring  Weeks in Treatment: 19 Procedure Performed for: Wound #5 Right,Plantar Hand - 1st Digit Performed By: Physician Maxwell Caul., MD The following information was scribed by: Shawn Stall The information was scribed for: Baltazar Najjar Post Procedure Diagnosis Same as Pre-procedure Notes curette size 3 used to debride wound. Electronic Signature(s) Signed: 04/05/2023 4:43:54 PM By: Baltazar Najjar MD Previous Signature: 04/05/2023 4:19:11 PM Version By: Shawn Stall RN, BSN Entered By: Baltazar Najjar on 04/05/2023 16:35:18 -------------------------------------------------------------------------------- Physical Exam Details Patient Name: Date of Service: [name]. 04/05/2023 3:30 PM Medical Record Number: [id number] Patient Account Number: [id number] Date of Birth/Sex: Treating RN: [birth year] (57 y.o. F) Primary Care Provider: Antony Haste Other Clinician: Referring Provider: Treating Provider/Extender: Murtis Sink in Treatment: 19 Constitutional Sitting or standing Blood Pressure is within target range for patient.. Pulse regular and within target range for patient.Marland Kitchen Respirations regular, non-labored and within target range.. Temperature is normal and within the target range for the patient.Marland Kitchen Appears in no distress. Notes Wound exam; right lateral ankle. Much improved wound dimensions. Base of the wound looks healthy. Somewhat dry no evidence of infection this does not require debridement Plantar aspect of the remanent of her right thumb. Dry fibrinous debris on the surface and overhanging skin I used a #3 curette to remove the overhanging and take some of the subcutaneous tissue off this area. Minimal bleeding. There is no probing to bone. Peripheral pulses are palpable at the radial pulses bilaterally Electronic Signature(s) Signed: 04/05/2023 4:43:54 PM By: Baltazar Najjar MD Entered By: Baltazar Najjar on 04/05/2023 16:37:40 [name], [name] ([id number]) [id number] Page 3 of 8 -------------------------------------------------------------------------------- Physician Orders Details Patient Name: Date of Service: [name]. 04/05/2023 3:30 PM Medical Record Number: [id number] Patient Account Number: [id number] Date of Birth/Sex: Treating RN: July 20, 1965 (57 y.o. 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 - Dr. Leanord Hawking - 04/13/23 at 0815am Return A ****skip week of Christmas**** ppointment in 2 weeks. - Dr. Leanord Hawking 04/29/2023 2pm Return A Anesthetic (In clinic) Topical Lidocaine 4% applied to wound bed Bathing/ Shower/ Hygiene May shower with protection but do not get wound dressing(s) wet. Protect dressing(s) with water repellant cover (for example, large plastic bag) or a cast cover and may then take shower. Edema Control - Orders / Instructions Elevate legs to the level of the heart or above for 30 minutes daily and/or when sitting for 3-4 times a day throughout the day. Avoid standing for long periods of time. Patient to wear own compression stockings every day. Exercise regularly Moisturize legs daily. Compression stocking or Garment 20-30 mm/Hg pressure to: Wound Treatment Wound #4 - Ankle Wound Laterality: Right, Lateral Cleanser: Vashe 5.8 (oz) 1 x Per Day/30 Days Discharge Instructions: Cleanse the wound with Vashe prior to applying a clean dressing using gauze sponges, not tissue or cotton balls. Topical: Mupirocin Ointment 1 x Per Day/30 Days Discharge Instructions: Apply Mupirocin (Bactroban) as instructed Prim Dressing: Maxorb Extra Ag+ Alginate  Dressing, 2x2 (in/in) (DME) (Generic) 1 x Per Day/30 Days ary Discharge Instructions: Apply to wound bed as instructed Secondary Dressing: Woven Gauze  Sponges 2x2 in (DME) (Generic) 1 x Per Day/30 Days Discharge Instructions: Apply over primary dressing as directed. Secondary Dressing: Zetuvit Plus Silicone Border Dressing 4x4 (in/in) (DME) (Generic) 1 x Per Day/30 Days Discharge Instructions: Apply silicone border over primary dressing as directed. Secondary Dressing: Tegaderm Absorbent Dressing, Square, 7.87 x 8 (in/in) (DME) (Generic) 1 x Per Day/30 Days Wound #5 - Hand - 1st Digit Wound Laterality: Plantar, Right Cleanser: Vashe 5.8 (oz) 1 x Per Day/30 Days Discharge Instructions: Cleanse the wound with Vashe prior to applying a clean dressing using gauze sponges, not tissue or cotton balls. Topical: Mupirocin Ointment 1 x Per Day/30 Days Discharge Instructions: Apply Mupirocin (Bactroban) as instructed Prim Dressing: Maxorb Extra Ag+ Alginate Dressing, 2x2 (in/in) (DME) (Generic) 1 x Per Day/30 Days ary Discharge Instructions: Apply to wound bed as instructed [name], [name] ([id number]) [id number] Page 4 of 8 Secondary Dressing: Woven Gauze Sponges 2x2 in (DME) (Generic) 1 x Per Day/30 Days Discharge Instructions: Apply over primary dressing as directed. Secured With: 72M Medipore H Soft Cloth Surgical T ape, 4 x 10 (in/yd) (DME) (Generic) 1 x Per Day/30 Days Discharge Instructions: Secure with tape as directed. Electronic Signature(s) Signed: 04/05/2023 4:19:11 PM By: Shawn Stall RN, BSN Signed: 04/05/2023 4:43:54 PM By: Baltazar Najjar MD Entered By: Shawn Stall on 04/05/2023 16:06:24 -------------------------------------------------------------------------------- Problem List Details Patient Name: Date of Service: [name]. 04/05/2023 3:30 PM Medical Record Number: [id number] Patient Account Number: [id number] Date of Birth/Sex: Treating RN: [birth year] (57 y.o. [name] Primary Care Provider: Antony Haste Other Clinician: Referring Provider: Treating Provider/Extender:  Murtis Sink in Treatment: 19 Active Problems ICD-10 Encounter Code Description Active Date MDM Diagnosis E11.621 Type 2 diabetes mellitus with foot ulcer 11/23/2022 No Yes L97.518 Non-pressure chronic ulcer of other part of right foot with other specified 11/23/2022 No Yes severity Z89.512 Acquired absence of left leg below knee 11/23/2022 No Yes T23.211D Burn of second degree of right thumb (nail), subsequent encounter 03/02/2023 No Yes Inactive Problems ICD-10 Code Description Active Date Inactive Date S68.614A 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 04/05/2023 3:30 PM Medical Record Number: [id number] Patient Account Number: [id number] Date of Birth/Sex: Treating RN: 1966-02-22 (57 y.o. F) Primary Care Provider: Antony Haste Other Clinician: Referring Provider: Treating Provider/Extender: Murtis Sink in Treatment: 19 Subjective History of Present Illness (HPI) ABI on Right: 1.01 Ms. 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 Most recently she had an amputation of the right ring finger. She presents today with a wound to the right lateral ankle that she has had for 3 months that was caused by her hitting the area with her wheelchair. She has been Vashe dressings to clean the  wound bed followed by antibiotic ointment. ABIs in office are 1.01 on the left and she has palpable dorsalis pedis and posterior tibial artery  pulses. Her foot is warm and well-perfused. 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 She has been cleaning the area with Vashe and using Neosporin to the wound bed. She currently denies signs of infection. Patient has a history of ankle surgery on 04-07-22 with right partial hardware removal and revision subtalar fusion. 8/6; patient presents for follow-up. She has been using Santyl to the wound bed and has been taking Augmentin and doxycycline without issues. She is scheduled for her MRI towards the end of the month. She has no issues or complaints. 8/13; patient presents for follow-up. She is scheduled for MRI next week. She has been using Santyl and Hydrofera Blue to the wound bed. She is taking Augmentin and doxycycline without issues. She has been on her feet more recently and has noted slightly more swelling to the ankle and more erythema. She denies systemic signs of infection. 8/27; patient presents for follow-up. She is completed 4 weeks of oral antibiotics. She obtained her MRI of the right ankle however this has not been read yet. She denies signs of infection. Wound is smaller. She has been using Santyl and Hydrofera Blue. 9/3; patient presents for follow-up. She had an MRI completed on 8/27. Reading shows no definitive active osteomyelitis to the wound over the lateral ankle. It did notice partial tearing of the peroneus brevis and longus, partial tearing of the distal Achilles tendon and partial tearing of the tibialis posterior tendon. She has an appointment with her podiatrist based on these findings next week. She is been using Santyl and Hydrofera Blue to the wound bed. Along with her compression stockings. Wound is smaller. 9/10; patient presents for follow-up. She has been using Santyl and Hydrofera Blue to the wound  bed along with her compression stockings daily. Wound is smaller. She has no issues or complaints. Insurance is asking for more recent hemoglobin A1c for potential approval of skin substitute. Patient is going to try and obtain this from her PCP. 9/17; left lateral ankle wound over the lateral malleolus. Everything looks better here healthy granulation we have been using Santyl Hydrofera Blue and border foam she is changing this every second day at home 10/1; this is a patient with a wound over the left lateral ankle/left lateral malleolus. Everything continues to look better. Healthy looking granulation and epithelialization. She has been using Santyl and Hydrofera Blue and border foam her husband changes the dressing every second day at home. Since she has been here she had surgery on her left hand that is wrapped we did not look at this today 10/22; 3-week hiatus. Area over the left lateral ankle left lateral malleolus. She tells me that up until 2 or 3 days ago the wound was continuing to get tract and everything was fine. She had a series of appointments which caused her to miss our follow-up. The wound got down to the size of the tip of her little finger then 3 days ago she developed an increase in wound size throbbing pain and erythema around the wound. She is relatively insensate but she still has a deep throbbing discomfort. She had been using Santyl and Hydrofera Blue as the primary dressings. 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 She follows with Dr. Ninetta Lights and is on a trimethoprim/sulfamethoxazole DS 1 tablet daily for MRSA prophylaxis. 11/5; 2-week follow-up. The area on the left lateral ankle I was concerned about last time looks better the erythema has receded the wound is  smaller and she is not in any pain. I gave her a 10-day course of Nuzyra which she is just finishing. It took her a while to get this through her insurance had to go through a prior authorization.  She has been using Santyl and Hydrofera Blue on this area She comes in today with an area on her palmar surface of her right thumb. Nonviable surface. She says she had this started about 2 months ago when she noticed a blister on her thumb she thinks she burned herself while cooking although she is not certain. She has a history of multiple amputations in her hand apparently secondary to MRSA infections 11/20; 2-week follow-up. The left lateral ankle actually looks quite good. We have been using Hydrofera Blue with underlying Santyl and the wound looks better. I have given her a course of linezolid I believe and the area of infection here looks resolved. She also has a difficult area on the remanent of her right thumb on the plantar aspect of the inner phalangeal joint. She is also been using Santyl and Hydrofera Blue in this area Of course she has a history of recurrent MRSA infections and she is on longstanding prophylaxis with a single Septra DS daily. 12/2; the patient's right thumb wound actually does not look too bad. There is a rim of epithelialization some callus. We have been using Hydrofera Blue and Santyl. The area on the right lateral ankle however has unchanged appearance. However there is again erythema around this which I have marked. I gave her a course of linezolid for this a few weeks ago which seemed to help. She of course has the disastrous course/history of MRSA for which she is on prophylactic Septra 12/9; the patient's thumb had undermining and some fibrinous debris on the surface required a debridement today. The area on the right lateral ankle much better than last week. There is no additional erythema around this area at the wound is measuring quite a bit smaller. [name], [name] ([id number]) [id number] Page 6 of 8 Objective Constitutional Sitting or standing Blood Pressure is within target range for patient.. Pulse regular and within target  range for patient.Marland Kitchen Respirations regular, non-labored and within target range.. Temperature is normal and within the target range for the patient.Marland Kitchen Appears in no distress. Vitals Time Taken: 3:35 AM, Height: 64 in, Weight: 261 lbs, BMI: 44.8, Temperature: 98.6 F, Pulse: 90 bpm, Respiratory Rate: 20 breaths/min, Blood Pressure: 111/61 mmHg, Capillary Blood Glucose: 142 mg/dl. General Notes: Wound exam; right lateral ankle. Much improved wound dimensions. Base of the wound looks healthy. Somewhat dry no evidence of infection this does not require debridement Plantar aspect of the remanent of her right thumb. Dry fibrinous debris on the surface and overhanging skin I used a #3 curette to remove the overhanging and take some of the subcutaneous tissue off this area. Minimal bleeding. There is no probing to bone. Peripheral pulses are palpable at the radial pulses bilaterally Integumentary (Hair, Skin) Wound #4 status is Open. Original cause of wound was Trauma. The date acquired was: 07/27/2022. The wound has been in treatment 19 weeks. The wound is located on the Right,Lateral Ankle. The wound measures 0.2cm length x 1.2cm width x 0.1cm depth; 0.188cm^2 area and 0.019cm^3 volume. There is Fat Layer (Subcutaneous Tissue) exposed. There is no tunneling or undermining noted. There is a medium amount of serosanguineous drainage noted. The wound margin is distinct with the outline attached to the wound base. There is large (67-100%) red,  pink granulation within the wound bed. There is no necrotic tissue within the wound bed. The periwound skin appearance did not exhibit: Callus, Crepitus, Excoriation, Induration, Rash, Scarring, Dry/Scaly, Maceration, Atrophie Blanche, Cyanosis, Ecchymosis, Hemosiderin Staining, Mottled, Pallor, Rubor, Erythema. Periwound temperature was noted as No Abnormality. Wound #5 status is Open. Original cause of wound was Thermal Burn. The date acquired was: 12/28/2022. The wound has  been in treatment 4 weeks. The wound is located on the Right,Plantar Hand - 1st Digit. The wound measures 1.3cm length x 0.9cm width x 0.1cm depth; 0.919cm^2 area and 0.092cm^3 volume. There is Fat Layer (Subcutaneous Tissue) exposed. There is no tunneling noted, however, there is undermining starting at 10:00 and ending at 2:00 with a maximum distance of 0.4cm. There is a medium amount of serosanguineous drainage noted. The wound margin is thickened. There is large (67-100%) pink granulation within the wound bed. There is a small (1-33%) amount of necrotic tissue within the wound bed including Adherent Slough. The periwound skin appearance exhibited: Callus, Dry/Scaly. The periwound skin appearance did not exhibit: Crepitus, Excoriation, Induration, Rash, Scarring, Maceration, Atrophie Blanche, Cyanosis, Ecchymosis, Hemosiderin Staining, Mottled, Pallor, Rubor, Erythema. Periwound temperature was noted as No Abnormality. 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 An Dressings and/or debridement of burns; small procedure was performed by Maxwell Caul., MD. Post procedure Diagnosis Wound #5: Same as Pre-Procedure Notes: curette size 3 used to debride wound. Plan Follow-up Appointments: Return Appointment in 1 week. - Dr. Leanord Hawking - 04/13/23 at 0815am ****skip week of Christmas**** Return Appointment in 2 weeks. - Dr. Leanord Hawking 04/29/2023 2pm Anesthetic: (In clinic) Topical Lidocaine 4% applied to wound bed Bathing/ Shower/ Hygiene: May shower with protection but do not get wound dressing(s) wet. Protect dressing(s) with water repellant cover (for  example, large plastic bag) or a cast cover and may then take shower. Edema Control - Orders / Instructions: Elevate legs to the level of the heart or above for 30 minutes daily and/or when sitting for 3-4 times a day throughout the day. Avoid standing for long periods of time. Patient to wear own compression stockings every day. Exercise regularly Moisturize legs daily. Compression stocking or Garment 20-30 mm/Hg pressure to: WOUND #4: - Ankle Wound Laterality: Right, Lateral Cleanser: Vashe 5.8 (oz) 1 x Per Day/30 Days Discharge Instructions: Cleanse the wound with Vashe prior to applying a clean dressing using gauze sponges, not tissue or cotton balls. [name], [name] ([id number]) [id number] Page 7 of 8 Topical: Mupirocin Ointment 1 x Per Day/30 Days Discharge Instructions: Apply Mupirocin (Bactroban) as instructed Prim Dressing: Maxorb Extra Ag+ Alginate Dressing, 2x2 (in/in) (DME) (Generic) 1 x Per Day/30 Days ary Discharge Instructions: Apply to wound bed as instructed Secondary Dressing: Woven Gauze Sponges 2x2 in (DME) (Generic) 1 x Per Day/30 Days Discharge Instructions: Apply over primary dressing as directed. Secondary Dressing: Zetuvit Plus Silicone Border Dressing 4x4 (in/in) (DME) (Generic) 1 x Per Day/30 Days Discharge Instructions: Apply silicone border over primary dressing as directed. Secondary Dressing: T egaderm Absorbent Dressing, Square, 7.87 x 8 (in/in) (DME) (Generic) 1 x Per Day/30 Days WOUND #5: - Hand - 1st Digit Wound Laterality: Plantar, Right Cleanser: Vashe 5.8 (  oz) 1 x Per Day/30 Days Discharge Instructions: Cleanse the wound with Vashe prior to applying a clean dressing using gauze sponges, not tissue or cotton balls. Topical: Mupirocin Ointment 1 x Per Day/30 Days Discharge Instructions: Apply Mupirocin (Bactroban) as instructed Prim Dressing: Maxorb Extra Ag+ Alginate Dressing, 2x2 (in/in) (DME) (Generic) 1 x Per Day/30  Days ary Discharge Instructions: Apply to wound bed as instructed Secondary Dressing: Woven Gauze Sponges 2x2 in (DME) (Generic) 1 x Per Day/30 Days Discharge Instructions: Apply over primary dressing as directed. Secured With: 8M Medipore H Soft Cloth Surgical T ape, 4 x 10 (in/yd) (DME) (Generic) 1 x Per Day/30 Days Discharge Instructions: Secure with tape as directed. 1. We switched to silver alginate and mupirocin on the right ankle with marked improvement we added this to the right thumb today. 2. No need for systemic antibiotics that I can see 3. Hopefully the right ankle will continue to improve. Almost shocking improvement today Electronic Signature(s) Signed: 04/05/2023 4:43:54 PM By: Baltazar Najjar MD Entered By: Baltazar Najjar on 04/05/2023 16:38:43 -------------------------------------------------------------------------------- SuperBill Details Patient Name: Date of Service: [name]. 04/05/2023 Medical Record Number: [id number] Patient Account Number: [id number] Date of Birth/Sex: Treating RN: 07-21-1965 (57 y.o. [name] Primary Care Provider: Antony Haste Other Clinician: Referring Provider: Treating Provider/Extender: Murtis Sink in Treatment: 19 Diagnosis Coding ICD-10 Codes Code Description E11.621 Type 2 diabetes mellitus with foot ulcer L97.518 Non-pressure chronic ulcer of other part of right foot with other specified severity Z89.512 Acquired absence of left leg below knee T23.211D Burn of second degree of right thumb (nail), subsequent encounter Facility Procedures : CPT4 Code: [id number] Description: 16020 - BURN DRSG W/O ANESTH-SM ICD-10 Diagnosis Description T23.211D Burn of second degree of right thumb (nail), subsequent encounter Modifier: Quantity: 1 Physician Procedures : CPT4 Code Description Modifier [id number] 16020 - WC PHYS DRESS/DEBRID SM,<5% TOT BODY SURF ICD-10 Diagnosis Description T23.211D Burn  of second degree of right thumb (nail), subsequent encounter Quantity: 1 Electronic Signature(s) Signed: 04/05/2023 4:43:54 PM By: Baltazar Najjar MD Previous Signature: 04/05/2023 4:19:11 PM Version By: Shawn Stall RN, BSN [place], [place] ([id number]) [id number] Page 8 of 8 Previous Signature: 04/05/2023 4:19:11 PM Version By: Shawn Stall RN, BSN Entered By: Baltazar Najjar on 04/05/2023 16:39:19

## 2023-04-12 ENCOUNTER — Ambulatory Visit (HOSPITAL_BASED_OUTPATIENT_CLINIC_OR_DEPARTMENT_OTHER): Payer: Commercial Managed Care - HMO | Admitting: Internal Medicine

## 2023-04-13 ENCOUNTER — Encounter (HOSPITAL_BASED_OUTPATIENT_CLINIC_OR_DEPARTMENT_OTHER): Payer: Commercial Managed Care - HMO | Admitting: Internal Medicine

## 2023-04-13 DIAGNOSIS — E11621 Type 2 diabetes mellitus with foot ulcer: Secondary | ICD-10-CM | POA: Diagnosis not present
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
## 2023-04-15 NOTE — Progress Notes (Signed)
ANDRALYN, TOUSANT Park ([id number]) [id number] Page 1 of 11 Visit Report for 04/13/2023 Arrival Information Details Patient Name: Date of Service: [name]. 04/13/2023 8:15 A M Medical Record Number: [id number] Patient Account Number: [id number] Date of Birth/Sex: Treating RN: 1965-10-10 (57 y.o. [name], [name] Primary Care [name]: [name] Other Clinician: Referring [name]: Treating [name]: [name] in Treatment: 20 Visit Information History Since Last Visit Added or deleted any medications: No Patient Arrived: Ambulatory Any new allergies or adverse reactions: No Arrival Time: 08:18 Had a fall or experienced change in No Accompanied By: self activities of daily living that may affect Transfer Assistance: None risk of falls: Patient Identification Verified: Yes Signs or symptoms of abuse/neglect since last visito No Secondary Verification Process Completed: Yes Hospitalized since last visit: No Patient Requires Transmission-Based Precautions: No Implantable device outside of the clinic excluding No Patient Has Alerts: No cellular tissue based products placed in the center since last visit: Has Dressing in Place as Prescribed: Yes Pain Present Now: No Electronic Signature(s) Signed: 04/13/2023 4:32:11 PM By: [name] Mu RN Entered By: [name] on 04/13/2023 08:18:52 -------------------------------------------------------------------------------- Clinic Level of Care Assessment Details Patient Name: Date of Service: [name]. 04/13/2023 8:15 A M Medical Record Number: [id number] Patient Account Number: [id number] Date of Birth/Sex: Treating RN: Jun 14, 1965 (57 y.o. [name], [name] Primary Care [name]: [name] Other Clinician: Referring [name]: Treating [name]: [name] in Treatment: 20 Clinic Level of  Care Assessment Items TOOL 4 Quantity Score X- 1 0 Use when only an EandM is performed on FOLLOW-UP visit ASSESSMENTS - Nursing Assessment / Reassessment X- 1 10 Reassessment of Co-morbidities (includes updates in patient status) X- 1 5 Reassessment of Adherence to Treatment Plan ASSESSMENTS - Wound and Skin A ssessment / Reassessment X - Simple Wound Assessment / Reassessment - one wound 1 5 []  - 0 Complex Wound Assessment / Reassessment - multiple wounds []  - 0 Dermatologic / Skin Assessment (not related to wound area) ASSESSMENTS - Focused Assessment []  - 0 Circumferential Edema Measurements - multi extremities []  - 0 Nutritional Assessment / Counseling / Intervention [name], [name] ([id number]) [id number] Page 2 of 11 []  - 0 Lower Extremity Assessment (monofilament, tuning fork, pulses) []  - 0 Peripheral Arterial Disease Assessment (using hand held doppler) ASSESSMENTS - Ostomy and/or Continence Assessment and Care []  - 0 Incontinence Assessment and Management []  - 0 Ostomy Care Assessment and Management (repouching, etc.) PROCESS - Coordination of Care X - Simple Patient / Family Education for ongoing care 1 15 []  - 0 Complex (extensive) Patient / Family Education for ongoing care X- 1 10 Staff obtains Chiropractor, Records, T Results / Process Orders est []  - 0 Staff telephones HHA, Nursing Homes / Clarify orders / etc []  - 0 Routine Transfer to another Facility (non-emergent condition) []  - 0 Routine Hospital Admission (non-emergent condition) []  - 0 New Admissions / Manufacturing engineer / Ordering NPWT Apligraf, etc. , []  - 0 Emergency Hospital Admission (emergent condition) X- 1 10 Simple Discharge Coordination []  - 0 Complex (extensive) Discharge Coordination PROCESS - Special Needs []  - 0 Pediatric / Nam Patient Management []  - 0 Isolation Patient Management []  - 0 Hearing / Language / Visual special needs []  -  0 Assessment of Community assistance (transportation, D/C planning, etc.) []  - 0 Additional assistance / Altered mentation []  - 0 Support Surface(s) Assessment (bed, cushion, seat, etc.) INTERVENTIONS - Wound Cleansing / Measurement  X - Simple Wound Cleansing - one wound 1 5 []  - 0 Complex Wound Cleansing - multiple wounds X- 1 5 Wound Imaging (photographs - any number of wounds) []  - 0 Wound Tracing (instead of photographs) X- 1 5 Simple Wound Measurement - one wound []  - 0 Complex Wound Measurement - multiple wounds INTERVENTIONS - Wound Dressings X - Small Wound Dressing one or multiple wounds 1 10 []  - 0 Medium Wound Dressing one or multiple wounds []  - 0 Large Wound Dressing one or multiple wounds []  - 0 Application of Medications - topical []  - 0 Application of Medications - injection INTERVENTIONS - Miscellaneous []  - 0 External ear exam []  - 0 Specimen Collection (cultures, biopsies, blood, body fluids, etc.) []  - 0 Specimen(s) / Culture(s) sent or taken to Lab for analysis []  - 0 Patient Transfer (multiple staff / Nurse, adult / Similar devices) []  - 0 Simple Staple / Suture removal (25 or less) []  - 0 Complex Staple / Suture removal (26 or more) []  - 0 Hypo / Hyperglycemic Management (close monitor of Blood Glucose) Harbaugh, [name] ([id number]) [id number] Page 3 of 11 []  - 0 Ankle / Brachial Index (ABI) - do not check if billed separately X- 1 5 Vital Signs Has the patient been seen at the hospital within the last three years: Yes Total Score: 85 Level Of Care: New/Established - Level 3 Electronic Signature(s) Signed: 04/13/2023 4:32:11 PM By: [name] Mu RN Entered By: [name] on 04/13/2023 08:51:00 -------------------------------------------------------------------------------- Complex / Palliative Patient Assessment Details Patient Name: Date of Service: [name]. 04/13/2023 8:15 A M Medical  Record Number: [id number] Patient Account Number: [id number] Date of Birth/Sex: Treating RN: [birth year] (58 y.o. [name] Primary Care [name]: [name] Other Clinician: Referring [name]: Treating [name]: [name] in Treatment: 20 Complex Wound Management Criteria Patient has remarkable or complex co-morbidities requiring medications or treatments that extend wound healing times. Examples: Diabetes mellitus with chronic renal failure or end stage renal disease requiring dialysis Advanced or poorly controlled rheumatoid arthritis Diabetes mellitus and end stage chronic obstructive pulmonary disease Active cancer with current chemo- or radiation therapy DM TYPE II, anemia, SA, charcot, gout, OA, hx CRO/MRSA/amputations of fingers, Left BKA Palliative Wound Management Criteria Care Approach Wound Care Plan: Complex Wound Management Electronic Signature(s) Signed: 04/13/2023 8:35:43 AM By: Shawn Stall RN, BSN Signed: 04/13/2023 4:32:13 PM By: Baltazar Najjar MD Entered By: Shawn Stall on 04/13/2023 08:35:43 -------------------------------------------------------------------------------- Encounter Discharge Information Details Patient Name: Date of Service: [name]. 04/13/2023 8:15 A M Medical Record Number: [id number] Patient Account Number: [id number] Date of Birth/Sex: Treating RN: 07-Oct-1965 (57 y.o. [name], [name] Primary Care Torres Hardenbrook: [name] Other Clinician: Referring Janiaya Ryser: Treating Allicia Culley/Extender: [name] in Treatment: 20 Encounter Discharge Information Items Discharge Condition: Stable Ambulatory Status: Ambulatory Discharge Destination: Home Transportation: Private Auto Accompanied By: self Schedule Follow-up Appointment: Yes Clinical Summary of Care: Patient Declined Electronic Signature(s) Signed: 04/13/2023 8:51:43 AM By: [name] Mu  RN Buer, [name] ([id number]) By: [name] Mu RN [phone].pdf Page 4 of 11 Signed: 04/13/2023 8:51:43 AM Entered By: [name] on 04/13/2023 08:51:43 -------------------------------------------------------------------------------- Lower Extremity Assessment Details Patient Name: Date of Service: [name]. 04/13/2023 8:15 A M Medical Record Number: [id number] Patient Account Number: [id number] Date of Birth/Sex: Treating RN: 02-12-1966 (57 y.o. [name], [name] Primary Care Catalino Plascencia: [name] Other Clinician: Referring Jahad Old: Treating Braylee Bosher/Extender: Baltazar Najjar  [name] Weeks in Treatment: 20 Edema Assessment Assessed: [Left: No] [Right: Yes] Edema: [Left: N] [Right: o] Calf Left: Right: Point of Measurement: 34.5 cm From Medial Instep 41 cm Ankle Left: Right: Point of Measurement: 12.5 cm From Medial Instep 24 cm Vascular Assessment Pulses: Dorsalis Pedis Palpable: [Right:Yes] Posterior Tibial Palpable: [Right:Yes] Extremity colors, hair growth, and conditions: Extremity Color: [Right:Normal] Hair Growth on Extremity: [Right:No] Temperature of Extremity: [Right:Warm] Capillary Refill: [Right:< 3 seconds] Dependent Rubor: [Right:No No] Electronic Signature(s) Signed: 04/13/2023 4:32:11 PM By: [name] Mu RN Entered By: [name] on 04/13/2023 08:22:04 -------------------------------------------------------------------------------- Multi Wound Chart Details Patient Name: Date of Service: [name]. 04/13/2023 8:15 A M Medical Record Number: [id number] Patient Account Number: [id number] Date of Birth/Sex: Treating RN: 1965-09-03 (57 y.o. F) Primary Care Arieona Swaggerty: [name] Other Clinician: Referring Zela Sobieski: Treating [name]: [name] in Treatment: 20 Vital Signs Height(in): 64 Capillary Blood Glucose(mg/dl):  841 Weight(lbs): 324 Pulse(bpm): 86 Body Mass Index(BMI): 44.8 Blood Pressure(mmHg): 132/77 Temperature(F): 98.2 Hedeen, [name] ([id number]) [id number] Page 5 of 11 Respiratory Rate(breaths/min): 17 [4:Photos:] [N/A:N/A] Right, Lateral Ankle Right, Plantar Hand - 1st Digit N/A Wound Location: Trauma Thermal Burn N/A Wounding Event: Diabetic Wound/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 Burn N/A Primary Etiology: Extremity Anemia, Sleep Apnea, Type II Anemia, Sleep Apnea, Type II N/A Comorbid History: Diabetes, Gout, Osteoarthritis, Diabetes, Gout, Osteoarthritis, Osteomyelitis, Neuropathy Osteomyelitis, Neuropathy 07/27/2022 12/28/2022 N/A Date Acquired: 20 6 N/A Weeks of Treatment: Open Open N/A Wound Status: No No N/A Wound Recurrence: Yes No N/A Pending A mputation on Presentation: 0.2x0.6x0.1 1.2x0.9x0.1 N/A Measurements L x W x D (cm) 0.094 0.848 N/A A (cm) : rea 0.009 0.085 N/A Volume (cm) : 96.70% 57.70% N/A % Reduction in A rea: 98.40% 57.50% N/A % Reduction in Volume: Grade 1 Full Thickness Without Exposed N/A Classification: Support Structures Medium Medium N/A Exudate Amount: Serosanguineous Serosanguineous N/A Exudate Type: red, brown red, brown N/A Exudate Color: Distinct, outline attached Thickened N/A Wound Margin: Large (67-100%) Large (67-100%) N/A Granulation Amount: Red, Pink Pink N/A Granulation Quality: None Present (0%) Small (1-33%) N/A Necrotic Amount: Fat Layer (Subcutaneous Tissue): Yes Fat Layer (Subcutaneous Tissue): Yes N/A Exposed Structures: Fascia: No Fascia: No Tendon: No Tendon: No Muscle: No Muscle: No Joint: No Joint: No Bone: No Bone: No Large (67-100%) None N/A Epithelialization: Excoriation: No Callus: Yes N/A Periwound Skin Texture: Induration: No Excoriation: No Callus: No Induration: No Crepitus: No Crepitus: No Rash: No Rash: No Scarring: No Scarring: No Maceration:  No Dry/Scaly: Yes N/A Periwound Skin Moisture: Dry/Scaly: No Maceration: No Atrophie Blanche: No Atrophie Blanche: No N/A Periwound Skin Color: Cyanosis: No Cyanosis: No Ecchymosis: No Ecchymosis: No Erythema: No Erythema: No Hemosiderin Staining: No Hemosiderin Staining: No Mottled: No Mottled: No Pallor: No Pallor: No Rubor: No Rubor: No No Abnormality No Abnormality N/A Temperature: Treatment Notes Electronic Signature(s) Signed: 04/13/2023 4:32:13 PM By: Baltazar Najjar MD Entered By: Baltazar Najjar on 04/13/2023 08:37:29 Multi-Disciplinary Care Plan Details -------------------------------------------------------------------------------- [name] ([id number]) [id number] Page 6 of 11 Patient Name: Date of Service: [name]. 04/13/2023 8:15 A M Medical Record Number: [id number] Patient Account Number: [id number] Date of Birth/Sex: Treating RN: [birth year] (57 y.o. [name], [name] Primary Care Ascencion Stegner: [name] Other Clinician: Referring Carols Clemence: Treating Sirius Woodford/Extender: [name] in Treatment: 20 Active Inactive Necrotic Tissue Nursing Diagnoses: Knowledge deficit related to management of necrotic/devitalized tissue Goals: Necrotic/devitalized tissue will be minimized in the wound  bed Date Initiated: 11/23/2022 Target Resolution Date: 04/27/2023 Goal Status: Active Patient/caregiver will verbalize understanding of reason and process for debridement of necrotic tissue Date Initiated: 11/23/2022 Target Resolution Date: 04/27/2023 Goal Status: Active Interventions: Assess patient pain level pre-, during and post procedure and prior to discharge Provide education on necrotic tissue and debridement process Treatment Activities: Biologic debridement : 11/23/2022 Excisional debridement : 11/23/2022 Notes: Nutrition Nursing Diagnoses: Potential for alteratiion in  Nutrition/Potential for imbalanced nutrition Goals: Patient/caregiver agrees to and verbalizes understanding of need to obtain nutritional consultation Date Initiated: 11/23/2022 Date Inactivated: 12/22/2022 Target Resolution Date: 12/25/2022 Goal Status: Met Patient/caregiver will maintain therapeutic glucose control Date Initiated: 11/23/2022 Target Resolution Date: 04/27/2023 Goal Status: Active Interventions: Assess HgA1c results as ordered upon admission and as needed Provide education on elevated blood sugars and impact on wound healing Provide education on nutrition Treatment Activities: Obtain HgA1c : 11/23/2022 Patient referred to Primary Care Physician for further nutritional evaluation : 11/23/2022 Notes: Wound/Skin Impairment Nursing Diagnoses: Knowledge deficit related to ulceration/compromised skin integrity Goals: Ulcer/skin breakdown will heal within 14 weeks Date Initiated: 11/23/2022 Target Resolution Date: 04/27/2023 Goal Status: Active Interventions: Assess patient/caregiver ability to perform ulcer/skin care regimen upon admission and as needed Assess ulceration(s) every visit Provide education on ulcer and skin care Treatment Activities: Skin care regimen initiated : 11/23/2022 Topical wound management initiated : 11/23/2022 [name], [name] ([id number]) [id number] Page 7 of 11 Notes: Electronic Signature(s) Signed: 04/13/2023 4:32:11 PM By: [name] Mu RN Entered By: [name] on 04/13/2023 08:32:31 -------------------------------------------------------------------------------- Pain Assessment Details Patient Name: Date of Service: [name]. 04/13/2023 8:15 A M Medical Record Number: [id number] Patient Account Number: [id number] Date of Birth/Sex: Treating RN: 01/29/1966 (57 y.o. [name], [name] Primary Care Dhanya Bogle: [name] Other Clinician: Referring Meiling Hendriks: Treating Hakop Humbarger/Extender:  [name] in Treatment: 20 Active Problems Location of Pain Severity and Description of Pain Patient Has Paino Yes Site Locations Pain Location: Generalized Pain, Pain in Ulcers With Dressing Change: Yes Duration of the Pain. Constant / Intermittento Intermittent Rate the pain. Current Pain Level: 6 Worst Pain Level: 10 Least Pain Level: 0 Tolerable Pain Level: 6 Character of Pain Describe the Pain: Aching Pain Management and Medication Current Pain Management: Medication: No Cold Application: No Rest: No Massage: No Activity: No T.E.N.S.: No Heat Application: No Leg drop or elevation: No Is the Current Pain Management Adequate: Adequate How does your wound impact your activities of daily livingo Sleep: No Bathing: No Appetite: No Relationship With Others: No Bladder Continence: No Emotions: No Bowel Continence: No Work: No Toileting: No Drive: No Dressing: No Hobbies: No Electronic Signature(s) Signed: 04/13/2023 4:32:11 PM By: [name] Mu RN Entered By: [name] on 04/13/2023 08:21:56 Daleo, [name] ([id number]) [id number] Page 8 of 11 -------------------------------------------------------------------------------- Patient/Caregiver Education Details Patient Name: Date of Service: [name]. 12/17/2024andnbsp8:15 A M Medical Record Number: [id number] Patient Account Number: [id number] Date of Birth/Gender: Treating RN: 12-20-1965 (57 y.o. Toniann Fail Primary Care Physician: [name] Other Clinician: Referring Physician: Treating Physician/Extender: [name] in Treatment: 20 Education Assessment Education Provided To: Patient Education Topics Provided Wound/Skin Impairment: Methods: Explain/Verbal Responses: Reinforcements needed, State content correctly [employer]) Signed: 04/13/2023 4:32:11 PM By: [name] Mu RN Entered By: [name] on 04/13/2023 08:32:53 -------------------------------------------------------------------------------- Wound Assessment Details Patient Name: Date of Service: [name]. 04/13/2023 8:15 A M Medical Record Number: [id number] Patient Account Number: [id number] Date  of Birth/Sex: Treating RN: 04/05/1966 (57 y.o. [name], [name] Primary Care Jerrik Housholder: [name] Other Clinician: Referring Lorrin Bodner: Treating Lindsay Soulliere/Extender: [name] in Treatment: 20 Wound Status Wound Number: 4 Primary Diabetic Wound/Ulcer of the Lower Extremity Etiology: Wound Location: Right, Lateral Ankle Wound Open Wounding Event: Trauma Status: Date Acquired: 07/27/2022 Comorbid Anemia, Sleep Apnea, Type II Diabetes, Gout, Osteoarthritis, Weeks Of Treatment: 20 History: Osteomyelitis, Neuropathy Clustered Wound: No Pending Amputation On Presentation Photos Wound Measurements Length: (cm) 0.2 Width: (cm) 0.6 Depth: (cm) 0.1 Area: (cm) 0.0 Cassell, Lekeshia Park ([id number]) Volume: (cm) % Reduction in Area: 96.7% % Reduction in Volume: 98.4% Epithelialization: Large (67-100%) 94 Tunneling: No [id number] Page 9 of 11 0.009 Undermining: No Wound Description Classification: Grade 1 Wound Margin: Distinct, outline attached Exudate Amount: Medium Exudate Type: Serosanguineous Exudate Color: red, brown Foul Odor After Cleansing: No Slough/Fibrino No Wound Bed Granulation Amount: Large (67-100%) Exposed Structure Granulation Quality: Red, Pink Fascia Exposed: No Necrotic Amount: None Present (0%) Fat Layer (Subcutaneous Tissue) Exposed: Yes Tendon Exposed: No Muscle Exposed: No Joint Exposed: No Bone Exposed: No Periwound Skin Texture Texture Color No Abnormalities Noted: No No Abnormalities Noted: No Callus: No Atrophie Blanche: No Crepitus: No Cyanosis: No Excoriation:  No Ecchymosis: No Induration: No Erythema: No Rash: No Hemosiderin Staining: No Scarring: No Mottled: No Pallor: No Moisture Rubor: No No Abnormalities Noted: No Dry / Scaly: No Temperature / Pain Maceration: No Temperature: No Abnormality Treatment Notes Wound #4 (Ankle) Wound Laterality: Right, Lateral Cleanser Vashe 5.8 (oz) Discharge Instruction: Cleanse the wound with Vashe prior to applying a clean dressing using gauze sponges, not tissue or cotton balls. Peri-Wound Care Topical Mupirocin Ointment Discharge Instruction: Apply Mupirocin (Bactroban) as instructed Primary Dressing Maxorb Extra Ag+ Alginate Dressing, 2x2 (in/in) Discharge Instruction: Apply to wound bed as instructed Secondary Dressing Woven Gauze Sponges 2x2 in Discharge Instruction: Apply over primary dressing as directed. Zetuvit Plus Silicone Border Dressing 4x4 (in/in) Discharge Instruction: Apply silicone border over primary dressing as directed. Tegaderm Absorbent Dressing, Square, 7.87 x 8 (in/in) Secured With Compression Wrap Compression Stockings Facilities manager) Signed: 04/13/2023 4:32:11 PM By: [name] Mu RN Signed: 04/14/2023 5:35:56 PM By: Shawn Stall RN, BSN Entered By: Shawn Stall on 04/13/2023 08:28:24 Heupel, [name] ([id number]) [id number] Page 10 of 11 -------------------------------------------------------------------------------- Wound Assessment Details Patient Name: Date of Service: [name]. 04/13/2023 8:15 A M Medical Record Number: [id number] Patient Account Number: [id number] Date of Birth/Sex: Treating RN: [birth year] (57 y.o. [name], [name] Primary Care Javian Nudd: [name] Other Clinician: Referring Sharnette Kitamura: Treating Antonea Gaut/Extender: [name] in Treatment: 20 Wound Status Wound Number: 5 Primary 2nd degree Burn Etiology: Wound Location: Right, Plantar Hand  - 1st Digit Wound Open Wounding Event: Thermal Burn Status: Date Acquired: 12/28/2022 Comorbid Anemia, Sleep Apnea, Type II Diabetes, Gout, Osteoarthritis, Weeks Of Treatment: 6 History: Osteomyelitis, Neuropathy Clustered Wound: No Photos Wound Measurements Length: (cm) 1.2 Width: (cm) 0.9 Depth: (cm) 0.1 Area: (cm) 0.848 Volume: (cm) 0.085 % Reduction in Area: 57.7% % Reduction in Volume: 57.5% Epithelialization: None Tunneling: No Undermining: No Wound Description Classification: Full Thickness Without Exposed Suppor Wound Margin: Thickened Exudate Amount: Medium Exudate Type: Serosanguineous Exudate Color: red, brown t Structures Foul Odor After Cleansing: No Slough/Fibrino Yes Wound Bed Granulation Amount: Large (67-100%) Exposed Structure Granulation Quality: Pink Fascia Exposed: No Necrotic Amount: Small (1-33%) Fat Layer (Subcutaneous Tissue) Exposed: Yes Necrotic Quality: Adherent Slough Tendon Exposed: No Muscle Exposed: No Joint Exposed:  No Bone Exposed: No Periwound Skin Texture Texture Color No Abnormalities Noted: No No Abnormalities Noted: No Callus: Yes Atrophie Blanche: No Crepitus: No Cyanosis: No Excoriation: No Ecchymosis: No Induration: No Erythema: No Rash: No Hemosiderin Staining: No Scarring: No Mottled: No Pallor: No Moisture Rubor: No No Abnormalities Noted: No Dry / Scaly: Yes Temperature / Pain Maceration: No Temperature: No Abnormality Bainter, Larena Park ([id number]) [id number] Page 11 of 11 Treatment Notes Wound #5 (Hand - 1st Digit) Wound Laterality: Plantar, Right Cleanser Vashe 5.8 (oz) Discharge Instruction: Cleanse the wound with Vashe prior to applying a clean dressing using gauze sponges, not tissue or cotton balls. Peri-Wound Care Topical Mupirocin Ointment Discharge Instruction: Apply Mupirocin (Bactroban) as instructed Primary Dressing Maxorb Extra Ag+ Alginate Dressing, 2x2  (in/in) Discharge Instruction: Apply to wound bed as instructed Secondary Dressing Woven Gauze Sponges 2x2 in Discharge Instruction: Apply over primary dressing as directed. Secured With 34M Medipore H Soft Cloth Surgical T ape, 4 x 10 (in/yd) Discharge Instruction: Secure with tape as directed. Compression Wrap Compression Stockings Add-Ons Electronic Signature(s) Signed: 04/13/2023 4:32:11 PM By: [name] Mu RN Signed: 04/14/2023 5:35:56 PM By: Shawn Stall RN, BSN Entered By: Shawn Stall on 04/13/2023 08:28:43 -------------------------------------------------------------------------------- Vitals Details Patient Name: Date of Service: [name]. 04/13/2023 8:15 A M Medical Record Number: [id number] Patient Account Number: [id number] Date of Birth/Sex: Treating RN: [birth year] (57 y.o. [name], [name] Primary Care Davey Limas: [name] Other Clinician: Referring Brinlynn Gorton: Treating Oreoluwa Gilmer/Extender: [name] in Treatment: 20 Vital Signs Time Taken: 08:18 Temperature (F): 98.2 Height (in): 64 Pulse (bpm): 86 Weight (lbs): 261 Respiratory Rate (breaths/min): 17 Body Mass Index (BMI): 44.8 Blood Pressure (mmHg): 132/77 Capillary Blood Glucose (mg/dl): 737 Reference Range: 80 - 120 mg / dl Electronic Signature(s) Signed: 04/13/2023 4:32:11 PM By: [name] Mu RN Entered By: [name] on 04/13/2023 08:21:32

## 2023-04-29 ENCOUNTER — Encounter (HOSPITAL_BASED_OUTPATIENT_CLINIC_OR_DEPARTMENT_OTHER): Payer: Medicaid Other | Attending: Internal Medicine | Admitting: Internal Medicine

## 2023-04-29 DIAGNOSIS — E11621 Type 2 diabetes mellitus with foot ulcer: Secondary | ICD-10-CM | POA: Insufficient documentation

## 2023-04-29 DIAGNOSIS — G4730 Sleep apnea, unspecified: Secondary | ICD-10-CM | POA: Insufficient documentation

## 2023-04-29 DIAGNOSIS — L97518 Non-pressure chronic ulcer of other part of right foot with other specified severity: Secondary | ICD-10-CM | POA: Diagnosis not present

## 2023-04-29 DIAGNOSIS — M1990 Unspecified osteoarthritis, unspecified site: Secondary | ICD-10-CM | POA: Diagnosis not present

## 2023-04-29 DIAGNOSIS — Z89512 Acquired absence of left leg below knee: Secondary | ICD-10-CM | POA: Insufficient documentation

## 2023-04-29 DIAGNOSIS — T23211A Burn of second degree of right thumb (nail), initial encounter: Secondary | ICD-10-CM | POA: Diagnosis not present

## 2023-04-29 DIAGNOSIS — D649 Anemia, unspecified: Secondary | ICD-10-CM | POA: Diagnosis not present

## 2023-04-29 DIAGNOSIS — E1140 Type 2 diabetes mellitus with diabetic neuropathy, unspecified: Secondary | ICD-10-CM | POA: Diagnosis not present

## 2023-04-30 NOTE — Progress Notes (Signed)
 LAURIANN, MILILLO Park ([id number]) [id number] Page 1 of 10 Visit Report for 04/29/2023 Arrival Information Details Patient Name: Date of Service: [name], [name]. 04/29/2023 2:00 PM Medical Record Number: [id number] Patient Account Number: [id number] Date of Birth/Sex: Treating RN: 07/23/1965 (58 y.o. F) Primary Care Dewaun Kinzler: Sophronia Sharper Other Clinician: Referring Samuel Mcpeek: Treating Shahrukh Pasch/Extender: Rufus Sharper Sophronia Sharper Devra in Treatment: 22 Visit Information History Since Last Visit Added or deleted any medications: No Patient Arrived: Ambulatory Any new allergies or adverse reactions: No Arrival Time: 14:06 Had [name] fall or experienced change in No Accompanied By: husband activities of daily living that may affect Transfer Assistance: None risk of falls: Patient Identification Verified: Yes Signs or symptoms of abuse/neglect since last visito No Secondary Verification Process Completed: Yes Hospitalized since last visit: No Patient Requires Transmission-Based Precautions: No Implantable device outside of the clinic excluding No Patient Has Alerts: No cellular tissue based products placed in the center since last visit: Has Dressing in Place as Prescribed: Yes Pain Present Now: No Electronic Signature(s) Signed: 04/29/2023 3:57:02 PM By: Wyn Iha Entered By: Wyn Iha on 04/29/2023 14:07:20 -------------------------------------------------------------------------------- Encounter Discharge Information Details Patient Name: Date of Service: [name] JONELLE DELENA LORAIN NDRA Park. 04/29/2023 2:00 PM Medical Record Number: [id number] Patient Account Number: [id number] Date of Birth/Sex: Treating RN: June 17, 1965 (58 y.o. [name] Primary Care Hikaru Delorenzo: Sophronia Sharper Other Clinician: Referring Marijah Larranaga: Treating Selam Pietsch/Extender: Rufus Sharper Sophronia Sharper Devra in Treatment: 22 Encounter Discharge Information Items Discharge  Condition: Stable Ambulatory Status: Ambulatory Discharge Destination: Home Transportation: Private Auto Accompanied By: husband Schedule Follow-up Appointment: Yes Clinical Summary of Care: Electronic Signature(s) Signed: 04/30/2023 12:56:04 PM By: Drury Nestle RN, BSN Entered By: Drury Park on 04/29/2023 14:24:56 Sutcliffe, Shanera Park ([id number]) [id number] Page 2 of 10 -------------------------------------------------------------------------------- Lower Extremity Assessment Details Patient Name: Date of Service: [name], [name]. 04/29/2023 2:00 PM Medical Record Number: [id number] Patient Account Number: [id number] Date of Birth/Sex: Treating RN: 1965-06-11 (58 y.o. F) Primary Care Leauna Sharber: Sophronia Sharper Other Clinician: Referring Lisbeth Puller: Treating Jaystin Mcgarvey/Extender: Rufus Sharper Sophronia Sharper Devra in Treatment: 22 Edema Assessment Assessed: Colletta: No] Glenis: No] Edema: [Left: N] [Right: o] Calf Left: Right: Point of Measurement: 34.5 cm From Medial Instep 42.4 cm Ankle Left: Right: Point of Measurement: 12.5 cm From Medial Instep 25.3 cm Vascular Assessment Extremity colors, hair growth, and conditions: Extremity Color: [Right:Normal] Hair Growth on Extremity: [Right:No] Temperature of Extremity: [Right:Warm] Capillary Refill: [Right:< 3 seconds] Dependent Rubor: [Right:No No] Electronic Signature(s) Signed: 04/29/2023 3:57:02 PM By: Wyn Iha Entered By: Wyn Iha on 04/29/2023 14:08:20 -------------------------------------------------------------------------------- Multi Wound Chart Details Patient Name: Date of Service: [name], [name]. 04/29/2023 2:00 PM Medical Record Number: [id number] Patient Account Number: [id number] Date of Birth/Sex: Treating RN: Jun 06, 1965 (58 y.o. F) Primary Care Gerardo Territo: Sophronia Sharper Other Clinician: Referring Edy Mcbane: Treating Micheale Schlack/Extender: Rufus Sharper Sophronia Sharper Devra in Treatment: 22 Vital Signs Height(in): 64 Capillary Blood Glucose(mg/dl): 834 Weight(lbs): 738 Pulse(bpm): 89 Body Mass Index(BMI): 44.8 Blood Pressure(mmHg): 102/60 Temperature(F): 98.4 Respiratory Rate(breaths/min): 18 [4:Photos:] [N/[name]:N/[name] [id number] Page 3 of 10] Right, Lateral Ankle Right, Plantar Hand - 1st Digit N/[name] Wound Location: Trauma Thermal Burn N/[name] Wounding Event: Diabetic Wound/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 Burn N/[name] Primary Etiology: Extremity Anemia, Sleep Apnea, Type II Anemia, Sleep Apnea, Type II N/[name] Comorbid History: Diabetes, Gout, Osteoarthritis, Diabetes, Gout, Osteoarthritis, Osteomyelitis, Neuropathy Osteomyelitis, Neuropathy 07/27/2022 12/28/2022 N/[name] Date Acquired: 22 8 N/[name] Weeks of Treatment: Open Open N/[name] Wound Status: No  No N/[name] Wound Recurrence: Yes No N/[name] Pending [name] mputation on Presentation: 0.4x0.2x0.1 0.4x0.2x0.2 N/[name] Measurements L x W x D (cm) 0.063 0.063 N/[name] [name] (cm) : rea 0.006 0.013 N/[name] Volume (cm) : 97.80% 96.90% N/[name] % Reduction in [name] rea: 99.00% 93.50% N/[name] % Reduction in Volume: Grade 1 Full Thickness Without Exposed N/[name] Classification: Support Structures Medium Medium N/[name] Exudate Amount: Serosanguineous Serosanguineous N/[name] Exudate Type: red, brown red, brown N/[name] Exudate Color: Distinct, outline attached Thickened N/[name] Wound Margin: Large (67-100%) Large (67-100%) N/[name] Granulation Amount: Red, Pink Pink N/[name] Granulation Quality: None Present (0%) Small (1-33%) N/[name] Necrotic Amount: Fat Layer (Subcutaneous Tissue): Yes Fat Layer (Subcutaneous Tissue): Yes N/[name] Exposed Structures: Fascia: No Fascia: No Tendon: No Tendon: No Muscle: No Muscle: No Joint: No Joint: No Bone: No Bone: No Large (67-100%) None N/[name] Epithelialization: Excoriation: No Callus: Yes N/[name] Periwound Skin Texture: Induration: No Excoriation: No Callus: No Induration: No Crepitus: No Crepitus: No Rash:  No Rash: No Scarring: No Scarring: No Maceration: No Dry/Scaly: Yes N/[name] Periwound Skin Moisture: Dry/Scaly: No Maceration: No Atrophie Blanche: No Atrophie Blanche: No N/[name] Periwound Skin Color: Cyanosis: No Cyanosis: No Ecchymosis: No Ecchymosis: No Erythema: No Erythema: No Hemosiderin Staining: No Hemosiderin Staining: No Mottled: No Mottled: No Pallor: No Pallor: No Rubor: No Rubor: No No Abnormality No Abnormality N/[name] Temperature: N/[name] Dressings and/or debridement of N/[name] Procedures Performed: burns; small Treatment Notes Wound #4 (Ankle) Wound Laterality: Right, Lateral Cleanser Vashe 5.8 (oz) Discharge Instruction: Cleanse the wound with Vashe prior to applying [name] clean dressing using gauze sponges, not tissue or cotton balls. Peri-Wound Care Topical Mupirocin  Ointment Discharge Instruction: Apply Mupirocin  (Bactroban ) as instructed Primary Dressing Maxorb Extra Ag+ Alginate Dressing, 2x2 (in/in) Discharge Instruction: Apply to wound bed as instructed Secondary Dressing Woven Gauze Sponges 2x2 in Discharge Instruction: Apply over primary dressing as directed. Zetuvit Plus Silicone Border Dressing 4x4 (in/in) Discharge Instruction: Apply silicone border over primary dressing as directed. [name], [name] ([id number]) [id number] Page 4 of 10 Tegaderm Absorbent Dressing, Square, 7.87 x 8 (in/in) Secured With Compression Wrap Compression Stockings Add-Ons Wound #5 (Hand - 1st Digit) Wound Laterality: Plantar, Right Cleanser Vashe 5.8 (oz) Discharge Instruction: Cleanse the wound with Vashe prior to applying [name] clean dressing using gauze sponges, not tissue or cotton balls. Peri-Wound Care Topical Mupirocin  Ointment Discharge Instruction: Apply Mupirocin  (Bactroban ) as instructed Primary Dressing Maxorb Extra Ag+ Alginate Dressing, 2x2 (in/in) Discharge Instruction: Apply to wound bed as instructed Secondary Dressing Woven  Gauze Sponges 2x2 in Discharge Instruction: Apply over primary dressing as directed. Secured With 31M Medipore H Soft Cloth Surgical T ape, 4 x 10 (in/yd) Discharge Instruction: Secure with tape as directed. Compression Wrap Compression Stockings Add-Ons Electronic Signature(s) Signed: 04/29/2023 4:03:36 PM By: Rufus Sharper MD Entered By: Rufus Sharper on 04/29/2023 14:29:52 -------------------------------------------------------------------------------- Multi-Disciplinary Care Plan Details Patient Name: Date of Service: [name], [name]. 04/29/2023 2:00 PM Medical Record Number: [id number] Patient Account Number: [id number] Date of Birth/Sex: Treating RN: [birth year] (58 y.o. [name] Primary Care Heiress Williamson: Sophronia Sharper Other Clinician: Referring Kyrstal Monterrosa: Treating Areeb Corron/Extender: Rufus Sharper Sophronia Sharper Devra in Treatment: 22 Active Inactive Necrotic Tissue Nursing Diagnoses: Knowledge deficit related to management of necrotic/devitalized tissue Goals: Necrotic/devitalized tissue will be minimized in the wound bed Date Initiated: 11/23/2022 Target Resolution Date: 05/28/2023 Goal Status: Active Patient/caregiver will verbalize understanding of reason and process for debridement of necrotic tissue Date Initiated: 11/23/2022 Date Inactivated: 04/29/2023 Target Resolution Date: 04/29/2023 [name], [name] ([id number])  [id number] Page 5 of 10 Goal Status: Met Interventions: Assess patient pain level pre-, during and post procedure and prior to discharge Provide education on necrotic tissue and debridement process Treatment Activities: Biologic debridement : 11/23/2022 Excisional debridement : 11/23/2022 Notes: Nutrition Nursing Diagnoses: Potential for alteratiion in Nutrition/Potential for imbalanced nutrition Goals: Patient/caregiver agrees to and verbalizes understanding of need to obtain nutritional consultation Date  Initiated: 11/23/2022 Date Inactivated: 12/22/2022 Target Resolution Date: 12/25/2022 Goal Status: Met Patient/caregiver will maintain therapeutic glucose control Date Initiated: 11/23/2022 Target Resolution Date: 06/25/2023 Goal Status: Active Interventions: Assess HgA1c results as ordered upon admission and as needed Provide education on elevated blood sugars and impact on wound healing Provide education on nutrition Treatment Activities: Obtain HgA1c : 11/23/2022 Patient referred to Primary Care Physician for further nutritional evaluation : 11/23/2022 Notes: Wound/Skin Impairment Nursing Diagnoses: Knowledge deficit related to ulceration/compromised skin integrity Goals: Ulcer/skin breakdown will heal within 14 weeks Date Initiated: 11/23/2022 Target Resolution Date: 04/27/2023 Goal Status: Active Interventions: Assess patient/caregiver ability to perform ulcer/skin care regimen upon admission and as needed Assess ulceration(s) every visit Provide education on ulcer and skin care Treatment Activities: Skin care regimen initiated : 11/23/2022 Topical wound management initiated : 11/23/2022 Notes: Electronic Signature(s) Signed: 04/30/2023 12:56:04 PM By: Drury Nestle RN, BSN Entered By: Drury Park on 04/29/2023 14:13:58 -------------------------------------------------------------------------------- Pain Assessment Details Patient Name: Date of Service: [name], [name]. 04/29/2023 2:00 PM Medical Record Number: [id number] Patient Account Number: [id number] Date of Birth/Sex: Treating RN: [birth year] (58 y.o. F) Primary Care Camil Hausmann: Sophronia Sharper Other Clinician: Referring Ayodele Hartsock: Treating Effrey Davidow/Extender: Rufus Sharper Sophronia Sharper Devra in Treatment: [address], [place] ([id number]) [id number] Page 6 of 10 Active Problems Location of Pain Severity and Description of Pain Patient Has Paino No Site Locations Pain Management and  Medication Current Pain Management: Electronic Signature(s) Signed: 04/29/2023 3:57:02 PM By: Wyn Iha Entered By: Wyn Iha on 04/29/2023 14:07:59 -------------------------------------------------------------------------------- Patient/Caregiver Education Details Patient Name: Date of Service: [name] JONELLE DELENA LORAIN NDRA Park. 1/2/2025andnbsp2:00 PM Medical Record Number: [id number] Patient Account Number: [id number] Date of Birth/Gender: Treating RN: 1965-12-20 (57 y.o. [name] Primary Care Physician: Sophronia Sharper Other Clinician: Referring Physician: Treating Physician/Extender: Rufus Sharper Sophronia Sharper Devra in Treatment: 22 Education Assessment Education Provided To: Patient Education Topics Provided Elevated Blood Sugar/ Impact on Healing: Handouts: Elevated Blood Sugars: How Do They Affect Wound Healing Methods: Explain/Verbal Responses: Reinforcements needed Electronic Signature(s) Signed: 04/30/2023 12:56:04 PM By: Drury Nestle RN, BSN Entered By: Drury Park on 04/29/2023 14:14:07 Kelner, Declyn Park ([id number]) [id number] Page 7 of 10 -------------------------------------------------------------------------------- Wound Assessment Details Patient Name: Date of Service: [name], [name]. 04/29/2023 2:00 PM Medical Record Number: [id number] Patient Account Number: [id number] Date of Birth/Sex: Treating RN: 09/29/1965 (58 y.o. F) Primary Care Bergen Melle: Sophronia Sharper Other Clinician: Referring Ameisha Mcclellan: Treating Javonne Louissaint/Extender: Rufus Sharper Sophronia Sharper Devra in Treatment: 22 Wound Status Wound Number: 4 Primary Diabetic Wound/Ulcer of the Lower Extremity Etiology: Wound Location: Right, Lateral Ankle Wound Open Wounding Event: Trauma Status: Date Acquired: 07/27/2022 Comorbid Anemia, Sleep Apnea, Type II Diabetes, Gout, Osteoarthritis, Weeks Of Treatment: 22 History: Osteomyelitis, Neuropathy Clustered  Wound: No Pending Amputation On Presentation Photos Wound Measurements Length: (cm) 0. Width: (cm) 0. Depth: (cm) 0. Area: (cm) 0 Volume: (cm) 0 4 % Reduction in Area: 97.8% 2 % Reduction in Volume: 99% 1 Epithelialization: Large (67-100%) .063 Tunneling: No .006 Undermining: No Wound Description Classification: Grade 1 Wound Margin: Distinct, outline attached Exudate Amount: Medium  Exudate Type: Serosanguineous Exudate Color: red, brown Foul Odor After Cleansing: No Slough/Fibrino No Wound Bed Granulation Amount: Large (67-100%) Exposed Structure Granulation Quality: Red, Pink Fascia Exposed: No Necrotic Amount: None Present (0%) Fat Layer (Subcutaneous Tissue) Exposed: Yes Tendon Exposed: No Muscle Exposed: No Joint Exposed: No Bone Exposed: No Periwound Skin Texture Texture Color No Abnormalities Noted: No No Abnormalities Noted: No Callus: No Atrophie Blanche: No Crepitus: No Cyanosis: No Excoriation: No Ecchymosis: No Induration: No Erythema: No Rash: No Hemosiderin Staining: No Scarring: No Mottled: No Pallor: No Moisture Rubor: No No Abnormalities Noted: No Dry / Scaly: No Temperature / Pain Maceration: No Temperature: No Abnormality Treatment Notes Wound #4 (Ankle) Wound Laterality: Right, Lateral [name], [name] ([id number]) [id number] Page 8 of 10 Cleanser Vashe 5.8 (oz) Discharge Instruction: Cleanse the wound with Vashe prior to applying [name] clean dressing using gauze sponges, not tissue or cotton balls. Peri-Wound Care Topical Mupirocin  Ointment Discharge Instruction: Apply Mupirocin  (Bactroban ) as instructed Primary Dressing Maxorb Extra Ag+ Alginate Dressing, 2x2 (in/in) Discharge Instruction: Apply to wound bed as instructed Secondary Dressing Woven Gauze Sponges 2x2 in Discharge Instruction: Apply over primary dressing as directed. Zetuvit Plus Silicone Border Dressing 4x4 (in/in) Discharge  Instruction: Apply silicone border over primary dressing as directed. Tegaderm Absorbent Dressing, Square, 7.87 x 8 (in/in) Secured With Compression Wrap Compression Stockings Add-Ons Electronic Signature(s) Signed: 04/29/2023 3:57:02 PM By: Wyn Iha Entered By: Wyn Iha on 04/29/2023 14:11:22 -------------------------------------------------------------------------------- Wound Assessment Details Patient Name: Date of Service: [name], [name]. 04/29/2023 2:00 PM Medical Record Number: [id number] Patient Account Number: [id number] Date of Birth/Sex: Treating RN: 05/25/1965 (58 y.o. F) Primary Care Placida Cambre: Sophronia Sharper Other Clinician: Referring Yarisa Lynam: Treating Shamel Germond/Extender: Rufus Sharper Sophronia Sharper Devra in Treatment: 22 Wound Status Wound Number: 5 Primary 2nd degree Burn Etiology: Wound Location: Right, Plantar Hand - 1st Digit Wound Open Wounding Event: Thermal Burn Status: Date Acquired: 12/28/2022 Comorbid Anemia, Sleep Apnea, Type II Diabetes, Gout, Osteoarthritis, Weeks Of Treatment: 8 History: Osteomyelitis, Neuropathy Clustered Wound: No Photos Wound Measurements Length: (cm) 0.4 Width: (cm) 0.2 [name], [name] ([id number]) Depth: (cm) 0.2 Area: (cm) 0.063 Volume: (cm) 0.013 % Reduction in Area: 96.9% % Reduction in Volume: 93.5% [id number] Page 9 of 10 Epithelialization: None Tunneling: No Undermining: No Wound Description Classification: Full Thickness Without Exposed Support Structures Wound Margin: Thickened Exudate Amount: Medium Exudate Type: Serosanguineous Exudate Color: red, brown Foul Odor After Cleansing: No Slough/Fibrino Yes Wound Bed Granulation Amount: Large (67-100%) Exposed Structure Granulation Quality: Pink Fascia Exposed: No Necrotic Amount: Small (1-33%) Fat Layer (Subcutaneous Tissue) Exposed: Yes Necrotic Quality: Adherent Slough Tendon Exposed: No Muscle Exposed:  No Joint Exposed: No Bone Exposed: No Periwound Skin Texture Texture Color No Abnormalities Noted: No No Abnormalities Noted: No Callus: Yes Atrophie Blanche: No Crepitus: No Cyanosis: No Excoriation: No Ecchymosis: No Induration: No Erythema: No Rash: No Hemosiderin Staining: No Scarring: No Mottled: No Pallor: No Moisture Rubor: No No Abnormalities Noted: No Dry / Scaly: Yes Temperature / Pain Maceration: No Temperature: No Abnormality Treatment Notes Wound #5 (Hand - 1st Digit) Wound Laterality: Plantar, Right Cleanser Vashe 5.8 (oz) Discharge Instruction: Cleanse the wound with Vashe prior to applying [name] clean dressing using gauze sponges, not tissue or cotton balls. Peri-Wound Care Topical Mupirocin  Ointment Discharge Instruction: Apply Mupirocin  (Bactroban ) as instructed Primary Dressing Maxorb Extra Ag+ Alginate Dressing, 2x2 (in/in) Discharge Instruction: Apply to wound bed as instructed Secondary Dressing Woven Gauze Sponges 2x2 in Discharge Instruction: Apply over primary  dressing as directed. Secured With 39M Medipore H Soft Cloth Surgical T ape, 4 x 10 (in/yd) Discharge Instruction: Secure with tape as directed. Compression Wrap Compression Stockings Add-Ons Electronic Signature(s) Signed: 04/29/2023 3:57:02 PM By: Wyn Iha Entered By: Wyn Iha on 04/29/2023 14:12:03 [name], [name] ([id number]) [id number] Page 10 of 10 -------------------------------------------------------------------------------- Vitals Details Patient Name: Date of Service: [name], [name]. 04/29/2023 2:00 PM Medical Record Number: [id number] Patient Account Number: [id number] Date of Birth/Sex: Treating RN: [birth year] (58 y.o. F) Primary Care Kiyra Slaubaugh: Sophronia Sharper Other Clinician: Referring Autumm Hattery: Treating Kemari Mares/Extender: Rufus Sharper Sophronia Sharper Devra in Treatment: 22 Vital Signs Time Taken: 14:07 Temperature (F):  98.4 Height (in): 64 Pulse (bpm): 89 Weight (lbs): 261 Respiratory Rate (breaths/min): 18 Body Mass Index (BMI): 44.8 Blood Pressure (mmHg): 102/60 Capillary Blood Glucose (mg/dl): 834 Reference Range: 80 - 120 mg / dl Electronic Signature(s) Signed: 04/29/2023 3:57:02 PM By: Wyn Iha Entered By: Wyn Iha on 04/29/2023 14:07:52

## 2023-04-30 NOTE — Progress Notes (Signed)
 [name], [name] ([id number]) [id number] Page 1 of 8 Visit Report for 04/29/2023 HPI Details Patient Name: Date of Service: [name], [name]. 04/29/2023 2:00 PM Medical Record Number: [id number] Patient Account Number: [id number] Date of Birth/Sex: Treating RN: 02/13/1966 (58 y.o. F) Primary Care Provider: Sophronia Sharper Other Clinician: Referring Provider: Treating Provider/Extender: Rufus Sharper Sophronia Sharper Devra in Treatment: 22 History of Present Illness HPI Description: ABI on Right: 1.01 Ms. 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 Most recently she had an amputation of the right ring finger. She presents today with [name] wound to the right lateral ankle that she has had for 3 months that was caused by her hitting the area with her wheelchair. She has been Vashe dressings to clean the wound bed followed by antibiotic ointment. ABIs in office are 1.01 on the left and she has palpable dorsalis pedis and posterior tibial artery pulses. Her foot is warm and well-perfused. 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 She has been cleaning the area with Vashe and using Neosporin to the wound bed. She currently denies signs of infection. Patient has [name] history of ankle surgery on 04-07-22 with right partial hardware removal and revision subtalar fusion. 8/6; patient presents for follow-up. She has been using Santyl to the wound bed and has been taking Augmentin  and doxycycline  without issues. She is scheduled for her MRI towards the end of the month. She has no issues or complaints. 8/13; patient presents for follow-up. She is scheduled for MRI next week. She has been using Santyl and Hydrofera Blue to the wound bed. She is taking Augmentin  and doxycycline  without issues. She has been on her feet more  recently and has noted slightly more swelling to the ankle and more erythema. She denies systemic signs of infection. 8/27; patient presents for follow-up. She is completed 4 weeks of oral antibiotics. She obtained her MRI of the right ankle however this has not been read yet. She denies signs of infection. Wound is smaller. She has been using Santyl and Hydrofera Blue. 9/3; patient presents for follow-up. She had an MRI completed on 8/27. Reading shows no definitive active osteomyelitis to the wound over the lateral ankle. It did notice partial tearing of the peroneus brevis and longus, partial tearing of the distal Achilles tendon and partial tearing of the tibialis posterior tendon. She has an appointment with her podiatrist based on these findings next week. She is been using Santyl and Hydrofera Blue to the wound bed. Along with her compression stockings. Wound is smaller. 9/10; patient presents for follow-up. She has been using Santyl and Hydrofera Blue to the wound bed along with her compression stockings daily. Wound is smaller. She has no issues or complaints. Insurance is asking for more recent hemoglobin A1c for potential approval of skin substitute. Patient is going to try and obtain this from her PCP. 9/17; left lateral ankle wound over the lateral malleolus. Everything looks better here healthy granulation we have been using Santyl Hydrofera Blue and border foam she is changing this every second day at home 10/1; this is [name] patient with [name] wound over the left lateral ankle/left lateral malleolus. Everything continues to look better. Healthy looking granulation and epithelialization. She has been using Santyl and Hydrofera Blue and border foam her husband changes the dressing every second day  at home. Since she has been here she had surgery on her left hand that is wrapped we did not look at this today 10/22; 3-week hiatus. Area over the left lateral ankle left lateral malleolus. She tells  me that up until 2 or 3 days ago the wound was continuing to get tract and everything was fine. She had [name] series of appointments which caused her to miss our follow-up. The wound got down to the size of the tip of her little finger then 3 days ago she developed an increase in wound size throbbing pain and erythema around the wound. She is relatively insensate but she still has [name] deep throbbing discomfort. She had been using Santyl and Hydrofera Blue as the primary dressings. 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 She follows with Dr. Eben and is on [name] trimethoprim /sulfamethoxazole  DS 1 tablet daily for MRSA prophylaxis. 11/5; 2-week follow-up. The area on the left lateral ankle I was concerned about last time looks better the erythema has receded the wound is smaller and she is not in any pain. I gave her [name] 10-day course of Nuzyra which she is just finishing. It took her [name] while to get this through her insurance had to go through [name] prior authorization. She has been using Santyl and Hydrofera Blue on this area She comes in today with an area on her palmar surface of her right thumb. Nonviable surface. She says she had this started about 2 months ago when she noticed [name] blister on her thumb she thinks she burned herself while cooking although she is not certain. She has [name] history of multiple amputations in her hand apparently secondary to MRSA infections 11/20; 2-week follow-up. The left lateral ankle actually looks quite good. We have been using Hydrofera Blue with underlying Santyl and the wound looks better. I have given her [name] course of linezolid  I believe and the area of infection here looks resolved. She also has [name] difficult area on the remanent of her right thumb on the plantar aspect of the inner phalangeal joint. She is also been using Santyl and Hydrofera Blue in this area Of course she has [name] history of recurrent MRSA infections and she is on longstanding prophylaxis with [name]  single Septra  DS daily. 12/2; the patient's right thumb wound actually does not look too bad. There is [name] rim of epithelialization some callus. We have been using Hydrofera Blue and Santyl. The area on the right lateral ankle however has unchanged appearance. However there is again erythema around this which I have marked. I gave her [name] course of linezolid  for this [name] few weeks ago which seemed to help. She of course has the disastrous course/history of MRSA for which she is on prophylactic Septra  12/9; the patient's thumb had undermining and some fibrinous debris on the surface required [name] debridement today. The area on the right lateral ankle much Coulson, Britlyn Park ([id number]) [phone].pdf Page 2 of 8 better than last week. There is no additional erythema around this area at the wound is measuring quite [name] bit smaller. 12/17; both wounds have improved. We have been using mupirocin  and silver alginate. No evidence of surrounding infection. In spite of her disastrous history with MRSA there is no evidence that this is active. We are treating the remanent of her right thumb on the plantar aspect of the interphalangeal joint which was [name] burn injury and the right lateral ankle. She developed erythema and some tenderness around the wound on  the right lateral ankle which I treated empirically for MRSA with linezolid . This has improved 05/29/2023. The patient has [name] wound on her right lateral malleolus as well as the remanent of her right thumb. Both look improved especially in the ankle wound. We are using mupirocin  and silver alginate to both wound areas Electronic Signature(s) Signed: 04/29/2023 4:03:36 PM By: Rufus Sharper MD Entered By: Rufus Sharper on 04/29/2023 14:36:00 -------------------------------------------------------------------------------- Dressings and/or debridement of burns; small Details Patient Name: Date of Service: [name], [name]. 04/29/2023 2:00  PM Medical Record Number: [id number] Patient Account Number: [id number] Date of Birth/Sex: Treating RN: 07/13/1965 (58 y.o. F) Primary Care Provider: Sophronia Sharper Other Clinician: Referring Provider: Treating Provider/Extender: Rufus Sharper Sophronia Sharper Devra in Treatment: 22 Procedure Performed for: Wound #5 Right,Plantar Hand - 1st Digit Performed By: Physician Rufus Sharper MATSU., MD The following information was scribed by: Drury Nestle The information was scribed for: Rufus Sharper Scull Procedure Diagnosis Same as Pre-procedure Notes curette #3 used to debride callous and slough. Electronic Signature(s) Signed: 04/29/2023 4:03:36 PM By: Rufus Sharper MD Entered By: Rufus Sharper on 04/29/2023 14:30:07 -------------------------------------------------------------------------------- Physical Exam Details Patient Name: Date of Service: [name], [name]. 04/29/2023 2:00 PM Medical Record Number: [id number] Patient Account Number: [id number] Date of Birth/Sex: Treating RN: 1966/04/24 (58 y.o. F) Primary Care Provider: Sophronia Sharper Other Clinician: Referring Provider: Treating Provider/Extender: Rufus Sharper Sophronia Sharper Devra in Treatment: 22 Constitutional Sitting or standing Blood Pressure is within target range for patient.. Pulse regular and within target range for patient.SABRA Respirations regular, non-labored and within target range.. Temperature is normal and within the target range for the patient.SABRA Appears in no distress. Notes Wound exam; right lateral ankle this is much improved and contracting. No debridement is required. On the remanent of her right thumb plantar aspect undermining removed with [name] #3 curette. Surface of this still looks better. Electronic Signature(s) Signed: 04/29/2023 4:03:36 PM By: Rufus Sharper MD Lender, [name] ([id number]) [id number] Page 3 of 8 Entered By: Rufus Sharper on 04/29/2023  14:36:47 -------------------------------------------------------------------------------- Physician Orders Details Patient Name: Date of Service: [name], [name]. 04/29/2023 2:00 PM Medical Record Number: [id number] Patient Account Number: [id number] Date of Birth/Sex: Treating RN: 1966/01/06 (58 y.o. 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 - Dr Rosan (front office to schedule) Return [name] Anesthetic (In clinic) Topical Lidocaine  4% applied to wound bed Bathing/ Shower/ Hygiene May shower with protection but do not get wound dressing(s) wet. Protect dressing(s) with water repellant cover (for example, large plastic bag) or [name] cast cover and may then take shower. Edema Control - Orders / Instructions Elevate legs to the level of the heart or above for 30 minutes daily and/or when sitting for 3-4 times [name] day throughout the day. Avoid standing for long periods of time. Patient to wear own compression stockings every day. Exercise regularly Moisturize legs daily. Compression stocking or Garment 20-30 mm/Hg pressure to: Wound Treatment Wound #4 -  Ankle Wound Laterality: Right, Lateral Cleanser: Vashe 5.8 (oz) 1 x Per Day/30 Days Discharge Instructions: Cleanse the wound with Vashe prior to applying [name] clean dressing using gauze sponges, not tissue or cotton  balls. Topical: Mupirocin  Ointment 1 x Per Day/30 Days Discharge Instructions: Apply Mupirocin  (Bactroban ) as instructed Prim Dressing: Maxorb Extra Ag+ Alginate Dressing, 2x2 (in/in) (Generic) 1 x Per Day/30 Days ary Discharge Instructions: Apply to wound bed as instructed Secondary Dressing: Woven Gauze Sponges 2x2 in (Generic) 1 x Per Day/30 Days Discharge Instructions: Apply over primary dressing as directed. Secondary Dressing: Zetuvit Plus Silicone Border Dressing 4x4 (in/in) (Generic) 1 x Per Day/30 Days Discharge Instructions: Apply silicone border over primary dressing as directed. Secondary Dressing: Tegaderm Absorbent Dressing, Square, 7.87 x 8 (in/in) (Generic) 1 x Per Day/30 Days Wound #5 - Hand - 1st Digit Wound Laterality: Plantar, Right Cleanser: Vashe 5.8 (oz) 1 x Per Day/30 Days Discharge Instructions: Cleanse the wound with Vashe prior to applying [name] clean dressing using gauze sponges, not tissue or cotton balls. Topical: Mupirocin  Ointment 1 x Per Day/30 Days Discharge Instructions: Apply Mupirocin  (Bactroban ) as instructed Prim Dressing: Maxorb Extra Ag+ Alginate Dressing, 2x2 (in/in) (Generic) 1 x Per Day/30 Days ary Lua, Liesa Park ([id number]) [phone].pdf Page 4 of 8 Discharge Instructions: Apply to wound bed as instructed Secondary Dressing: Woven Gauze Sponges 2x2 in (Generic) 1 x Per Day/30 Days Discharge Instructions: Apply over primary dressing as directed. Secured With: 81M Medipore H Soft Cloth Surgical T ape, 4 x 10 (in/yd) (Generic) 1 x Per Day/30 Days Discharge Instructions: Secure with tape as directed. Electronic Signature(s) Signed: 04/29/2023 4:03:36 PM By: Rufus Sharper MD Signed: 04/30/2023 12:56:04 PM By: Drury Nestle RN, BSN Entered By: Drury Nestle on 04/29/2023 14:23:00 -------------------------------------------------------------------------------- Problem List Details Patient Name: Date of Service: [name], [name] LEXA  NDRA Park. 04/29/2023 2:00 PM Medical Record Number: [id number] Patient Account Number: [id number] Date of Birth/Sex: Treating RN: April 03, 1966 (58 y.o. JEANELL Drury Nestle Primary Care Provider: Sophronia Sharper Other Clinician: Referring Provider: Treating Provider/Extender: Rufus Sharper Sophronia Sharper Devra in Treatment: 22 Active Problems ICD-10 Encounter Code Description Active Date MDM Diagnosis E11.621 Type 2 diabetes mellitus with foot ulcer 11/23/2022 No Yes L97.518 Non-pressure chronic ulcer of other part of right foot with other specified 11/23/2022 No Yes severity Z89.512 Acquired absence of left leg below knee 11/23/2022 No Yes T23.211D Burn of second degree of right thumb (nail), subsequent encounter 03/02/2023 No Yes Inactive Problems ICD-10 Code Description Active Date Inactive Date S68.614A 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 04/29/2023 2:00 PM Medical Record Number: [id number] Patient Account Number: [id number] Date of Birth/Sex: Treating RN: 08-23-1965 (58 y.o. F) Primary Care Provider: Sophronia Sharper Other Clinician: Referring Provider: Treating Provider/Extender: Rufus Sharper Sophronia Sharper Devra in Treatment: 22 Subjective History of Present Illness (HPI) ABI on Right: 1.01 Ms. 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 Most recently she had an amputation of the right ring finger.  She presents today with [name] wound to the right lateral ankle that she has had for 3 months that was caused by her  hitting the area with her wheelchair. She has been Vashe dressings to clean the wound bed followed by antibiotic ointment. ABIs in office are 1.01 on the left and she has palpable dorsalis pedis and posterior tibial artery pulses. Her foot is warm and well-perfused. 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 She has been cleaning the area with Vashe and using Neosporin to the wound bed. She currently denies signs of infection. Patient has [name] history of ankle surgery on 04-07-22 with right partial hardware removal and revision subtalar fusion. 8/6; patient presents for follow-up. She has been using Santyl to the wound bed and has been taking Augmentin  and doxycycline  without issues. She is scheduled for her MRI towards the end of the month. She has no issues or complaints. 8/13; patient presents for follow-up. She is scheduled for MRI next week. She has been using Santyl and Hydrofera Blue to the wound bed. She is taking Augmentin  and doxycycline  without issues. She has been on her feet more recently and has noted slightly more swelling to the ankle and more erythema. She denies systemic signs of infection. 8/27; patient presents for follow-up. She is completed 4 weeks of oral antibiotics. She obtained her MRI of the right ankle however this has not been read yet. She denies signs of infection. Wound is smaller. She has been using Santyl and Hydrofera Blue. 9/3; patient presents for follow-up. She had an MRI completed on 8/27. Reading shows no definitive active osteomyelitis to the wound over the lateral ankle. It did notice partial tearing of the peroneus brevis and longus, partial tearing of the distal Achilles tendon and partial tearing of the tibialis posterior tendon. She has an appointment with her podiatrist based on these findings next week. She is been using Santyl  and Hydrofera Blue to the wound bed. Along with her compression stockings. Wound is smaller. 9/10; patient presents for follow-up. She has been using Santyl and Hydrofera Blue to the wound bed along with her compression stockings daily. Wound is smaller. She has no issues or complaints. Insurance is asking for more recent hemoglobin A1c for potential approval of skin substitute. Patient is going to try and obtain this from her PCP. 9/17; left lateral ankle wound over the lateral malleolus. Everything looks better here healthy granulation we have been using Santyl Hydrofera Blue and border foam she is changing this every second day at home 10/1; this is [name] patient with [name] wound over the left lateral ankle/left lateral malleolus. Everything continues to look better. Healthy looking granulation and epithelialization. She has been using Santyl and Hydrofera Blue and border foam her husband changes the dressing every second day at home. Since she has been here she had surgery on her left hand that is wrapped we did not look at this today 10/22; 3-week hiatus. Area over the left lateral ankle left lateral malleolus. She tells me that up until 2 or 3 days ago the wound was continuing to get tract and everything was fine. She had [name] series of appointments which caused her to miss our follow-up. The wound got down to the size of the tip of her little finger then 3 days ago she developed an increase in wound size throbbing pain and erythema around the wound. She is relatively insensate but she still has [name] deep throbbing discomfort. She had been using Santyl and Hydrofera Blue as the primary dressings. 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 She follows with Dr.  Hatcher and is on [name] trimethoprim /sulfamethoxazole  DS 1 tablet daily for MRSA prophylaxis. 11/5; 2-week follow-up. The area on the left lateral ankle I was concerned about last time looks better the erythema has receded the wound is smaller  and she is not in any pain. I gave her [name] 10-day course of Nuzyra which she is just finishing. It took her [name] while to get this through her insurance had to go through [name] prior authorization. She has been using Santyl and Hydrofera Blue on this area She comes in today with an area on her palmar surface of her right thumb. Nonviable surface. She says she had this started about 2 months ago when she noticed [name] blister on her thumb she thinks she burned herself while cooking although she is not certain. She has [name] history of multiple amputations in her hand apparently secondary to MRSA infections 11/20; 2-week follow-up. The left lateral ankle actually looks quite good. We have been using Hydrofera Blue with underlying Santyl and the wound looks better. I have given her [name] course of linezolid  I believe and the area of infection here looks resolved. She also has [name] difficult area on the remanent of her right thumb on the plantar aspect of the inner phalangeal joint. She is also been using Santyl and Hydrofera Blue in this area Of course she has [name] history of recurrent MRSA infections and she is on longstanding prophylaxis with [name] single Septra  DS daily. 12/2; the patient's right thumb wound actually does not look too bad. There is [name] rim of epithelialization some callus. We have been using Hydrofera Blue and Santyl. The area on the right lateral ankle however has unchanged appearance. However there is again erythema around this which I have marked. I gave her [name] course of linezolid  for this [name] few weeks ago which seemed to help. She of course has the disastrous course/history of MRSA for which she is on prophylactic Septra  12/9; the patient's thumb had undermining and some fibrinous debris on the surface required [name] debridement today. The area on the right lateral ankle much better than last week. There is no additional erythema around this area at the wound is measuring quite [name] bit smaller. 12/17; both wounds have  improved. We have been using mupirocin  and silver alginate. No evidence of surrounding infection. In spite of her disastrous history with MRSA there is no evidence that this is active. We are treating the remanent of her right thumb on the plantar aspect of the interphalangeal joint which was [name] burn injury and the right lateral ankle. She developed erythema and some tenderness around the wound on the right lateral ankle which I treated empirically [name], [name] ([id number]) [phone].pdf Page 6 of 8 for MRSA with linezolid . This has improved 05/29/2023. The patient has [name] wound on her right lateral malleolus as well as the remanent of her right thumb. Both look improved especially in the ankle wound. We are using mupirocin  and silver alginate to both wound areas Objective Constitutional Sitting or standing Blood Pressure is within target range for patient.. Pulse regular and within target range for patient.SABRA Respirations regular, non-labored and within target range.. Temperature is normal and within the target range for the patient.SABRA Appears in no distress. Vitals Time Taken: 2:07 PM, Height: 64 in, Weight: 261 lbs, BMI: 44.8, Temperature: 98.4 F, Pulse: 89 bpm, Respiratory Rate: 18 breaths/min, Blood Pressure: 102/60 mmHg, Capillary Blood Glucose: 165 mg/dl. General Notes: Wound exam; right lateral ankle this is much improved  and contracting. No debridement is required. On the remanent of her right thumb plantar aspect undermining removed with [name] #3 curette. Surface of this still looks better. Integumentary (Hair, Skin) Wound #4 status is Open. Original cause of wound was Trauma. The date acquired was: 07/27/2022. The wound has been in treatment 22 weeks. The wound is located on the Right,Lateral Ankle. The wound measures 0.4cm length x 0.2cm width x 0.1cm depth; 0.063cm^2 area and 0.006cm^3 volume. There is Fat Layer (Subcutaneous Tissue) exposed. There is no tunneling or  undermining noted. There is [name] medium amount of serosanguineous drainage noted. The wound margin is distinct with the outline attached to the wound base. There is large (67-100%) red, pink granulation within the wound bed. There is no necrotic tissue within the wound bed. The periwound skin appearance did not exhibit: Callus,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

## 2023-05-04 DIAGNOSIS — E1169 Type 2 diabetes mellitus with other specified complication: Secondary | ICD-10-CM | POA: Diagnosis not present

## 2023-05-04 DIAGNOSIS — G4733 Obstructive sleep apnea (adult) (pediatric): Secondary | ICD-10-CM | POA: Diagnosis not present

## 2023-05-04 DIAGNOSIS — Z1211 Encounter for screening for malignant neoplasm of colon: Secondary | ICD-10-CM | POA: Diagnosis not present

## 2023-05-04 DIAGNOSIS — E042 Nontoxic multinodular goiter: Secondary | ICD-10-CM | POA: Diagnosis not present

## 2023-05-04 DIAGNOSIS — E118 Type 2 diabetes mellitus with unspecified complications: Secondary | ICD-10-CM | POA: Diagnosis not present

## 2023-05-04 DIAGNOSIS — N1832 Chronic kidney disease, stage 3b: Secondary | ICD-10-CM | POA: Diagnosis not present

## 2023-05-04 DIAGNOSIS — E785 Hyperlipidemia, unspecified: Secondary | ICD-10-CM | POA: Diagnosis not present

## 2023-05-04 DIAGNOSIS — G894 Chronic pain syndrome: Secondary | ICD-10-CM | POA: Diagnosis not present

## 2023-05-12 ENCOUNTER — Encounter (HOSPITAL_BASED_OUTPATIENT_CLINIC_OR_DEPARTMENT_OTHER): Payer: Medicaid Other | Admitting: Internal Medicine

## 2023-05-12 DIAGNOSIS — T23211D Burn of second degree of right thumb (nail), subsequent encounter: Secondary | ICD-10-CM | POA: Diagnosis not present

## 2023-05-12 DIAGNOSIS — E11621 Type 2 diabetes mellitus with foot ulcer: Secondary | ICD-10-CM

## 2023-05-12 DIAGNOSIS — L97518 Non-pressure chronic ulcer of other part of right foot with other specified severity: Secondary | ICD-10-CM | POA: Diagnosis not present

## 2023-05-12 DIAGNOSIS — G4730 Sleep apnea, unspecified: Secondary | ICD-10-CM | POA: Diagnosis not present

## 2023-05-12 DIAGNOSIS — M1990 Unspecified osteoarthritis, unspecified site: Secondary | ICD-10-CM | POA: Diagnosis not present

## 2023-05-12 DIAGNOSIS — E1140 Type 2 diabetes mellitus with diabetic neuropathy, unspecified: Secondary | ICD-10-CM | POA: Diagnosis not present

## 2023-05-12 DIAGNOSIS — Z89512 Acquired absence of left leg below knee: Secondary | ICD-10-CM | POA: Diagnosis not present

## 2023-05-12 DIAGNOSIS — D649 Anemia, unspecified: Secondary | ICD-10-CM | POA: Diagnosis not present

## 2023-05-13 DIAGNOSIS — H5213 Myopia, bilateral: Secondary | ICD-10-CM | POA: Diagnosis not present
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
## 2023-05-18 DIAGNOSIS — Z1211 Encounter for screening for malignant neoplasm of colon: Secondary | ICD-10-CM | POA: Diagnosis not present

## 2023-05-18 NOTE — Progress Notes (Signed)
SMYA, MCGARRIGLE Park ([id number]) [id number] Page 1 of 10 Visit Report for 05/12/2023 Arrival Information Details Patient Name: Date of Service: [name] Draft NDRA Park. 05/12/2023 9:00 A M Medical Record Number: [id number] Patient Account Number: [id number] Date of Birth/Sex: Treating RN: [birth year] (58 y.o. F) Primary Care Maclovio Henson: Antony Haste Other Clinician: Referring Jazariah Teall: Treating Ioanna Colquhoun/Extender: Avelina Laine in Treatment: 24 Visit Information History Since Last Visit Added or deleted any medications: No Patient Arrived: Ambulatory Any new allergies or adverse reactions: No Arrival Time: 09:11 Had a fall or experienced change in No Accompanied By: self activities of daily living that may affect Transfer Assistance: None risk of falls: Patient Identification Verified: Yes Signs or symptoms of abuse/neglect since last visito No Secondary Verification Process Completed: Yes Hospitalized since last visit: No Patient Requires Transmission-Based Precautions: No Implantable device outside of the clinic excluding No Patient Has Alerts: No cellular tissue based products placed in the center since last visit: Has Dressing in Place as Prescribed: Yes Pain Present Now: No Electronic Signature(s) Signed: 05/18/2023 4:04:36 PM By: Thayer Dallas Entered By: Thayer Dallas on 05/12/2023 09:14:30 -------------------------------------------------------------------------------- Encounter Discharge Information Details Patient Name: Date of Service: [name] Draft NDRA Park. 05/12/2023 9:00 A M Medical Record Number: [id number] Patient Account Number: [id number] Date of Birth/Sex: Treating RN: 09-05-1965 (58 y.o. [name] Primary Care Berlie Persky: Antony Haste Other Clinician: Referring Rayden Scheper: Treating [employer] Null/Extender: Avelina Laine in Treatment: 24 Encounter Discharge Information Items Discharge  Condition: Stable Ambulatory Status: Ambulatory Discharge Destination: Home Transportation: Private Auto Accompanied By: self Schedule Follow-up Appointment: Yes Clinical Summary of Care: Patient Declined Electronic Signature(s) Signed: 05/12/2023 5:19:30 PM By: Karie Schwalbe RN Entered By: Karie Schwalbe on 05/12/2023 09:58:51 [name], [name] ([id number]) [id number] Page 2 of 10 -------------------------------------------------------------------------------- Lower Extremity Assessment Details Patient Name: Date of Service: [name] Draft NDRA Park. 05/12/2023 9:00 A M Medical Record Number: [id number] Patient Account Number: [id number] Date of Birth/Sex: Treating RN: 23-Mar-1966 (58 y.o. F) Primary Care Nusaiba Guallpa: Antony Haste Other Clinician: Referring Alashia Brownfield: Treating Varsha Knock/Extender: Avelina Laine in Treatment: 24 Edema Assessment Assessed: [name]: No] [name]: No] Edema: [Left: N] [Right: o] Calf Left: Right: Point of Measurement: 34.5 cm From Medial Instep 42.3 cm Ankle Left: Right: Point of Measurement: 12.5 cm From Medial Instep 25.4 cm Vascular Assessment Extremity colors, hair growth, and conditions: Extremity Color: [Right:Normal] Hair Growth on Extremity: [Right:No] Temperature of Extremity: [Right:Warm] Capillary Refill: [Right:< 3 seconds] Dependent Rubor: [Right:No No] Electronic Signature(s) Signed: 05/18/2023 4:04:36 PM By: Thayer Dallas Entered By: Thayer Dallas on 05/12/2023 09:26:09 -------------------------------------------------------------------------------- Multi Wound Chart Details Patient Name: Date of Service: [name] Draft NDRA Park. 05/12/2023 9:00 A M Medical Record Number: [id number] Patient Account Number: [id number] Date of Birth/Sex: Treating RN: 05-Jan-1966 (58 y.o. F) Primary Care Marquita Lias: Antony Haste Other Clinician: Referring Deretha Ertle: Treating Anabia Weatherwax/Extender: Avelina Laine in Treatment: 24 Vital Signs Height(in): 64 Pulse(bpm): 94 Weight(lbs): 261 Blood Pressure(mmHg): 115/70 Body Mass Index(BMI): 44.8 Temperature(F): 98.4 Respiratory Rate(breaths/min): 18 [4:Photos:] [N/A:N/A [id number] Page 3 of 10] Right, Lateral Ankle Right, Plantar Hand - 1st Digit N/A Wound Location: Trauma Thermal Burn N/A Wounding Event: Diabetic Wound/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 Burn N/A Primary Etiology: Extremity Anemia, Sleep Apnea, Type II Anemia, Sleep Apnea, Type II N/A Comorbid History: Diabetes, Gout, Osteoarthritis, Diabetes, Gout, Osteoarthritis, Osteomyelitis, Neuropathy Osteomyelitis, Neuropathy 07/27/2022 12/28/2022 N/A Date Acquired: 24 10 N/A Weeks of Treatment: Open Open N/A Wound Status:  No No N/A Wound Recurrence: Yes No N/A Pending A mputation on Presentation: 0.1x0.1x0.1 0.6x0.1x0.3 N/A Measurements L x W x D (cm) 0.008 0.047 N/A A (cm) : rea 0.001 0.014 N/A Volume (cm) : 99.70% 97.70% N/A % Reduction in A rea: 99.80% 93.00% N/A % Reduction in Volume: Grade 1 Full Thickness Without Exposed N/A Classification: Support Structures Medium Medium N/A Exudate A mount: Serosanguineous Serosanguineous N/A Exudate Type: red, brown red, brown N/A Exudate Color: Distinct, outline attached Thickened N/A Wound Margin: Small (1-33%) Large (67-100%) N/A Granulation Amount: Pink Pink N/A Granulation Quality: Small (1-33%) Small (1-33%) N/A Necrotic Amount: Eschar Adherent Slough N/A Necrotic Tissue: Fat Layer (Subcutaneous Tissue): Yes Fat Layer (Subcutaneous Tissue): Yes N/A Exposed Structures: Fascia: No Fascia: No Tendon: No Tendon: No Muscle: No Muscle: No Joint: No Joint: No Bone: No Bone: No Large (67-100%) Small (1-33%) N/A Epithelialization: Scarring: Yes Callus: Yes N/A Periwound Skin Texture: Excoriation: No Excoriation: No Induration: No Induration:  No Callus: No Crepitus: No Crepitus: No Rash: No Rash: No Scarring: No Maceration: No Dry/Scaly: Yes N/A Periwound Skin Moisture: Dry/Scaly: No Maceration: No Atrophie Blanche: No Atrophie Blanche: No N/A Periwound Skin Color: Cyanosis: No Cyanosis: No Ecchymosis: No Ecchymosis: No Erythema: No Erythema: No Hemosiderin Staining: No Hemosiderin Staining: No Mottled: No Mottled: No Pallor: No Pallor: No Rubor: No Rubor: No No Abnormality No Abnormality N/A Temperature: N/A Dressings and/or debridement of N/A Procedures Performed: burns; small Treatment Notes Wound #4 (Ankle) Wound Laterality: Right, Lateral Cleanser Vashe 5.8 (oz) Discharge Instruction: Cleanse the wound with Vashe prior to applying a clean dressing using gauze sponges, not tissue or cotton balls. Peri-Wound Care Topical Mupirocin Ointment Discharge Instruction: Apply Mupirocin (Bactroban) as instructed Primary Dressing Maxorb Extra Ag+ Alginate Dressing, 2x2 (in/in) Discharge Instruction: Apply to wound bed as instructed Secondary Dressing Woven Gauze Sponges 2x2 in Discharge Instruction: Apply over primary dressing as directed. Zetuvit Plus Silicone Border Dressing 4x4 (in/in) Discharge Instruction: Apply silicone border over primary dressing as directed. [name], [name] ([id number]) [id number] Page 4 of 10 Tegaderm Absorbent Dressing, Square, 7.87 x 8 (in/in) Secured With Compression Wrap Compression Stockings Add-Ons Wound #5 (Hand - 1st Digit) Wound Laterality: Plantar, Right Cleanser Vashe 5.8 (oz) Discharge Instruction: Cleanse the wound with Vashe prior to applying a clean dressing using gauze sponges, not tissue or cotton balls. Peri-Wound Care Topical Mupirocin Ointment Discharge Instruction: Apply Mupirocin (Bactroban) as instructed Primary Dressing Maxorb Extra Ag+ Alginate Dressing, 2x2 (in/in) Discharge Instruction: Apply to wound bed as  instructed Secondary Dressing Woven Gauze Sponges 2x2 in Discharge Instruction: Apply over primary dressing as directed. Secured With 28M Medipore H Soft Cloth Surgical T ape, 4 x 10 (in/yd) Discharge Instruction: Secure with tape as directed. Compression Wrap Compression Stockings Add-Ons Electronic Signature(s) Signed: 05/12/2023 4:27:59 PM By: Geralyn Corwin DO Entered By: Geralyn Corwin on 05/12/2023 10:45:54 -------------------------------------------------------------------------------- Multi-Disciplinary Care Plan Details Patient Name: Date of Service: [name] Draft NDRA Park. 05/12/2023 9:00 A M Medical Record Number: [id number] Patient Account Number: [id number] Date of Birth/Sex: Treating RN: 19-May-1965 (58 y.o. [name] Primary Care Braysen Cloward: Antony Haste Other Clinician: Referring Terricka Onofrio: Treating Crystian Frith/Extender: Avelina Laine in Treatment: 24 Active Inactive Necrotic Tissue Nursing Diagnoses: Knowledge deficit related to management of necrotic/devitalized tissue Goals: Necrotic/devitalized tissue will be minimized in the wound bed Date Initiated: 11/23/2022 Target Resolution Date: 06/25/2023 Goal Status: Active Patient/caregiver will verbalize understanding of reason and process for debridement of necrotic tissue [name], [name] ([id number]) [id number] Page  5 of 10 Date Initiated: 11/23/2022 Date Inactivated: 04/29/2023 Target Resolution Date: 04/29/2023 Goal Status: Met Interventions: Assess patient pain level pre-, during and post procedure and prior to discharge Provide education on necrotic tissue and debridement process Treatment Activities: Biologic debridement : 11/23/2022 Excisional debridement : 11/23/2022 Notes: Nutrition Nursing Diagnoses: Potential for alteratiion in Nutrition/Potential for imbalanced nutrition Goals: Patient/caregiver agrees to and verbalizes understanding of need  to obtain nutritional consultation Date Initiated: 11/23/2022 Date Inactivated: 12/22/2022 Target Resolution Date: 12/25/2022 Goal Status: Met Patient/caregiver will maintain therapeutic glucose control Date Initiated: 11/23/2022 Target Resolution Date: 06/25/2023 Goal Status: Active Interventions: Assess HgA1c results as ordered upon admission and as needed Provide education on elevated blood sugars and impact on wound healing Provide education on nutrition Treatment Activities: Obtain HgA1c : 11/23/2022 Patient referred to Primary Care Physician for further nutritional evaluation : 11/23/2022 Notes: Wound/Skin Impairment Nursing Diagnoses: Knowledge deficit related to ulceration/compromised skin integrity Goals: Ulcer/skin breakdown will heal within 14 weeks Date Initiated: 11/23/2022 Target Resolution Date: 06/25/2023 Goal Status: Active Interventions: Assess patient/caregiver ability to perform ulcer/skin care regimen upon admission and as needed Assess ulceration(s) every visit Provide education on ulcer and skin care Treatment Activities: Skin care regimen initiated : 11/23/2022 Topical wound management initiated : 11/23/2022 Notes: Electronic Signature(s) Signed: 05/12/2023 5:19:30 PM By: Karie Schwalbe RN Entered By: Karie Schwalbe on 05/12/2023 09:57:18 -------------------------------------------------------------------------------- Pain Assessment Details Patient Name: Date of Service: [name] Draft NDRA Park. 05/12/2023 9:00 A M Medical Record Number: [id number] Patient Account Number: [id number] Date of Birth/Sex: Treating RN: Mar 10, 1966 (58 y.o. F) Primary Care Jamani Eley: Antony Haste Other Clinician: Referring Grae Leathers: Treating Takiah Maiden/Extender: Darinda, Garton, [place] Park ([id number]) [id number] Page 6 of 10 Weeks in Treatment: 24 Active Problems Location of Pain Severity and Description of Pain Patient Has Paino  No Site Locations Pain Management and Medication Current Pain Management: Electronic Signature(s) Signed: 05/18/2023 4:04:36 PM By: Thayer Dallas Entered By: Thayer Dallas on 05/12/2023 09:14:45 -------------------------------------------------------------------------------- Patient/Caregiver Education Details Patient Name: Date of Service: [name] Draft NDRA Park. 1/15/2025andnbsp9:00 A M Medical Record Number: [id number] Patient Account Number: [id number] Date of Birth/Gender: Treating RN: [birth year] (58 y.o. [name] Primary Care Physician: Antony Haste Other Clinician: Referring Physician: Treating Physician/Extender: Avelina Laine in Treatment: 24 Education Assessment Education Provided To: Patient Education Topics Provided Wound/Skin Impairment: Methods: Demonstration, Explain/Verbal Responses: State content correctly Electronic Signature(s) Signed: 05/12/2023 5:19:30 PM By: Karie Schwalbe RN Entered By: Karie Schwalbe on 05/12/2023 09:57:40 [name], [name] ([id number]) [id number] Page 7 of 10 -------------------------------------------------------------------------------- Wound Assessment Details Patient Name: Date of Service: [name] Draft NDRA Park. 05/12/2023 9:00 A M Medical Record Number: [id number] Patient Account Number: [id number] Date of Birth/Sex: Treating RN: 03-Aug-1965 (58 y.o. F) Primary Care Cian Costanzo: Antony Haste Other Clinician: Referring Skarlet Lyons: Treating Aarvi Stotts/Extender: Avelina Laine in Treatment: 24 Wound Status Wound Number: 4 Primary Diabetic Wound/Ulcer of the Lower Extremity Etiology: Wound Location: Right, Lateral Ankle Wound Open Wounding Event: Trauma Status: Date Acquired: 07/27/2022 Comorbid Anemia, Sleep Apnea, Type II Diabetes, Gout, Osteoarthritis, Weeks Of Treatment: 24 History: Osteomyelitis, Neuropathy Clustered Wound: No Pending  Amputation On Presentation Photos Wound Measurements Length: (cm) 0 Width: (cm) 0 Depth: (cm) 0 Area: (cm) Volume: (cm) .1 % Reduction in Area: 99.7% .1 % Reduction in Volume: 99.8% .1 Epithelialization: Large (67-100%) 0.008 Tunneling: No 0.001 Undermining: No Wound Description Classification: Grade 1 Wound Margin: Distinct, outline attached Exudate Amount: Medium Exudate Type: Serosanguineous Exudate Color:  red, brown Foul Odor After Cleansing: No Slough/Fibrino No Wound Bed Granulation Amount: Small (1-33%) Exposed Structure Granulation Quality: Pink Fascia Exposed: No Necrotic Amount: Small (1-33%) Fat Layer (Subcutaneous Tissue) Exposed: Yes Necrotic Quality: Eschar Tendon Exposed: No Muscle Exposed: No Joint Exposed: No Bone Exposed: No Periwound Skin Texture Texture Color No Abnormalities Noted: No No Abnormalities Noted: Yes Callus: No Temperature / Pain Crepitus: No Temperature: No Abnormality Excoriation: No Induration: No Rash: No Scarring: Yes Moisture No Abnormalities Noted: Yes [name], [name] ([id number]) [id number] Page 8 of 10 Treatment Notes Wound #4 (Ankle) Wound Laterality: Right, Lateral Cleanser Vashe 5.8 (oz) Discharge Instruction: Cleanse the wound with Vashe prior to applying a clean dressing using gauze sponges, not tissue or cotton balls. Peri-Wound Care Topical Mupirocin Ointment Discharge Instruction: Apply Mupirocin (Bactroban) as instructed Primary Dressing Maxorb Extra Ag+ Alginate Dressing, 2x2 (in/in) Discharge Instruction: Apply to wound bed as instructed Secondary Dressing Woven Gauze Sponges 2x2 in Discharge Instruction: Apply over primary dressing as directed. Zetuvit Plus Silicone Border Dressing 4x4 (in/in) Discharge Instruction: Apply silicone border over primary dressing as directed. Tegaderm Absorbent Dressing, Square, 7.87 x 8 (in/in) Secured With Compression Wrap Compression  Stockings Add-Ons Electronic Signature(s) Signed: 05/12/2023 5:19:30 PM By: Karie Schwalbe RN Entered By: Karie Schwalbe on 05/12/2023 09:56:28 -------------------------------------------------------------------------------- Wound Assessment Details Patient Name: Date of Service: [name] [name] NDRA Park. 05/12/2023 9:00 A M Medical Record Number: [id number] Patient Account Number: [id number] Date of Birth/Sex: Treating RN: [birth year] (58 y.o. F) Primary Care Catalyna Reilly: Antony Haste Other Clinician: Referring Tomeko Scoville: Treating Mariaelena Cade/Extender: Avelina Laine in Treatment: 24 Wound Status Wound Number: 5 Primary 2nd degree Burn Etiology: Wound Location: Right, Plantar Hand - 1st Digit Wound Open Wounding Event: Thermal Burn Status: Date Acquired: 12/28/2022 Comorbid Anemia, Sleep Apnea, Type II Diabetes, Gout, Osteoarthritis, Weeks Of Treatment: 10 History: Osteomyelitis, Neuropathy Clustered Wound: No Photos [name], [name] ([id number]) [id number] Page 9 of 10 Wound Measurements Length: (cm) 0.6 Width: (cm) 0.1 Depth: (cm) 0.3 Area: (cm) 0.047 Volume: (cm) 0.014 % Reduction in Area: 97.7% % Reduction in Volume: 93% Epithelialization: Small (1-33%) Tunneling: No Undermining: No Wound Description Classification: Full Thickness Without Exposed Support Structures Wound Margin: Thickened Exudate Amount: Medium Exudate Type: Serosanguineous Exudate Color: red, brown Foul Odor After Cleansing: No Slough/Fibrino Yes Wound Bed Granulation Amount: Large (67-100%) Exposed Structure Granulation Quality: Pink Fascia Exposed: No Necrotic Amount: Small (1-33%) Fat Layer (Subcutaneous Tissue) Exposed: Yes Necrotic Quality: Adherent Slough Tendon Exposed: No Muscle Exposed: No Joint Exposed: No Bone Exposed: No Periwound Skin Texture Texture Color No Abnormalities Noted: No No Abnormalities Noted: No Callus:  Yes Atrophie Blanche: No Crepitus: No Cyanosis: No Excoriation: No Ecchymosis: No Induration: No Erythema: No Rash: No Hemosiderin Staining: No Scarring: No Mottled: No Pallor: No Moisture Rubor: No No Abnormalities Noted: No Dry / Scaly: Yes Temperature / Pain Maceration: No Temperature: No Abnormality Treatment Notes Wound #5 (Hand - 1st Digit) Wound Laterality: Plantar, Right Cleanser Vashe 5.8 (oz) Discharge Instruction: Cleanse the wound with Vashe prior to applying a clean dressing using gauze sponges, not tissue or cotton balls. Peri-Wound Care Topical Mupirocin Ointment Discharge Instruction: Apply Mupirocin (Bactroban) as instructed Primary Dressing Maxorb Extra Ag+ Alginate Dressing, 2x2 (in/in) Discharge Instruction: Apply to wound bed as instructed Secondary Dressing Woven Gauze Sponges 2x2 in Discharge Instruction: Apply over primary dressing as directed. Secured With 36M Medipore H Soft Cloth Surgical T ape, 4 x 10 (in/yd) Discharge Instruction: Secure with tape as directed. Compression  Wrap Compression Stockings Add-Ons Electronic Signature(s) Signed: 05/12/2023 5:19:30 PM By: Karie Schwalbe RN Entered By: Karie Schwalbe on 05/12/2023 09:55:47 [name], [name] ([id number]) [id number] Page 10 of 10 -------------------------------------------------------------------------------- Vitals Details Patient Name: Date of Service: [name] [name] NDRA Park. 05/12/2023 9:00 A M Medical Record Number: [id number] Patient Account Number: [id number] Date of Birth/Sex: Treating RN: [birth year] (58 y.o. F) Primary Care Ricki Clack: Antony Haste Other Clinician: Referring Gurtaj Ruz: Treating Alverna Fawley/Extender: Avelina Laine in Treatment: 24 Vital Signs Time Taken: 09:25 Temperature (F): 98.4 Height (in): 64 Pulse (bpm): 94 Weight (lbs): 261 Respiratory Rate (breaths/min): 18 Body Mass Index (BMI): 44.8 Blood  Pressure (mmHg): 115/70 Reference Range: 80 - 120 mg / dl Electronic Signature(s) Signed: 05/18/2023 4:04:36 PM By: Thayer Dallas Entered By: Thayer Dallas on 05/12/2023 09:25:45

## 2023-05-19 ENCOUNTER — Encounter (HOSPITAL_BASED_OUTPATIENT_CLINIC_OR_DEPARTMENT_OTHER): Payer: Medicaid Other | Admitting: Internal Medicine

## 2023-05-19 DIAGNOSIS — E1140 Type 2 diabetes mellitus with diabetic neuropathy, unspecified: Secondary | ICD-10-CM | POA: Diagnosis not present

## 2023-05-19 DIAGNOSIS — L97518 Non-pressure chronic ulcer of other part of right foot with other specified severity: Secondary | ICD-10-CM

## 2023-05-19 DIAGNOSIS — Z89512 Acquired absence of left leg below knee: Secondary | ICD-10-CM | POA: Diagnosis not present

## 2023-05-19 DIAGNOSIS — T23211D Burn of second degree of right thumb (nail), subsequent encounter: Secondary | ICD-10-CM

## 2023-05-19 DIAGNOSIS — M1990 Unspecified osteoarthritis, unspecified site: Secondary | ICD-10-CM | POA: Diagnosis not present

## 2023-05-19 DIAGNOSIS — E11621 Type 2 diabetes mellitus with foot ulcer: Secondary | ICD-10-CM | POA: Diagnosis not present

## 2023-05-19 DIAGNOSIS — D649 Anemia, unspecified: Secondary | ICD-10-CM | POA: Diagnosis not present

## 2023-05-19 DIAGNOSIS — G4730 Sleep apnea, unspecified: Secondary | ICD-10-CM | POA: Diagnosis not present

## 2023-05-23 LAB — COLOGUARD: COLOGUARD: NEGATIVE

## 2023-05-23 LAB — EXTERNAL GENERIC LAB PROCEDURE: COLOGUARD: NEGATIVE

## 2023-05-26 ENCOUNTER — Encounter (HOSPITAL_BASED_OUTPATIENT_CLINIC_OR_DEPARTMENT_OTHER): Payer: Medicaid Other | Admitting: Internal Medicine

## 2023-05-26 DIAGNOSIS — M1990 Unspecified osteoarthritis, unspecified site: Secondary | ICD-10-CM | POA: Diagnosis not present

## 2023-05-26 DIAGNOSIS — L97518 Non-pressure chronic ulcer of other part of right foot with other specified severity: Secondary | ICD-10-CM | POA: Diagnosis not present

## 2023-05-26 DIAGNOSIS — D649 Anemia, unspecified: Secondary | ICD-10-CM | POA: Diagnosis not present

## 2023-05-26 DIAGNOSIS — Z89512 Acquired absence of left leg below knee: Secondary | ICD-10-CM | POA: Diagnosis not present

## 2023-05-26 DIAGNOSIS — E11621 Type 2 diabetes mellitus with foot ulcer: Secondary | ICD-10-CM | POA: Diagnosis not present

## 2023-05-26 DIAGNOSIS — G4730 Sleep apnea, unspecified: Secondary | ICD-10-CM | POA: Diagnosis not present

## 2023-05-26 DIAGNOSIS — E1140 Type 2 diabetes mellitus with diabetic neuropathy, unspecified: Secondary | ICD-10-CM | POA: Diagnosis not present

## 2023-05-31 ENCOUNTER — Encounter (HOSPITAL_BASED_OUTPATIENT_CLINIC_OR_DEPARTMENT_OTHER): Payer: Medicaid Other | Attending: Internal Medicine | Admitting: Internal Medicine

## 2023-05-31 DIAGNOSIS — Z89022 Acquired absence of left finger(s): Secondary | ICD-10-CM | POA: Diagnosis not present

## 2023-05-31 DIAGNOSIS — Z8614 Personal history of Methicillin resistant Staphylococcus aureus infection: Secondary | ICD-10-CM | POA: Diagnosis not present

## 2023-05-31 DIAGNOSIS — Z981 Arthrodesis status: Secondary | ICD-10-CM | POA: Insufficient documentation

## 2023-05-31 DIAGNOSIS — Z89021 Acquired absence of right finger(s): Secondary | ICD-10-CM | POA: Insufficient documentation

## 2023-05-31 DIAGNOSIS — T23211A Burn of second degree of right thumb (nail), initial encounter: Secondary | ICD-10-CM | POA: Insufficient documentation

## 2023-05-31 DIAGNOSIS — T23211D Burn of second degree of right thumb (nail), subsequent encounter: Secondary | ICD-10-CM

## 2023-05-31 DIAGNOSIS — Z792 Long term (current) use of antibiotics: Secondary | ICD-10-CM | POA: Insufficient documentation

## 2023-05-31 DIAGNOSIS — X088XXA Exposure to other specified smoke, fire and flames, initial encounter: Secondary | ICD-10-CM | POA: Diagnosis not present

## 2023-05-31 DIAGNOSIS — L97518 Non-pressure chronic ulcer of other part of right foot with other specified severity: Secondary | ICD-10-CM | POA: Insufficient documentation

## 2023-05-31 DIAGNOSIS — E11621 Type 2 diabetes mellitus with foot ulcer: Secondary | ICD-10-CM | POA: Diagnosis not present

## 2023-05-31 DIAGNOSIS — Z89512 Acquired absence of left leg below knee: Secondary | ICD-10-CM | POA: Insufficient documentation

## 2023-06-02 ENCOUNTER — Ambulatory Visit (HOSPITAL_BASED_OUTPATIENT_CLINIC_OR_DEPARTMENT_OTHER): Payer: Medicaid Other | Admitting: Internal Medicine

## 2023-06-04 DIAGNOSIS — N1832 Chronic kidney disease, stage 3b: Secondary | ICD-10-CM | POA: Diagnosis not present

## 2023-06-08 DIAGNOSIS — Z89512 Acquired absence of left leg below knee: Secondary | ICD-10-CM | POA: Diagnosis not present

## 2023-06-08 DIAGNOSIS — I422 Other hypertrophic cardiomyopathy: Secondary | ICD-10-CM | POA: Diagnosis not present

## 2023-06-08 DIAGNOSIS — E785 Hyperlipidemia, unspecified: Secondary | ICD-10-CM | POA: Diagnosis not present

## 2023-06-08 DIAGNOSIS — I10 Essential (primary) hypertension: Secondary | ICD-10-CM | POA: Diagnosis not present

## 2023-06-09 ENCOUNTER — Encounter (HOSPITAL_BASED_OUTPATIENT_CLINIC_OR_DEPARTMENT_OTHER): Payer: Medicaid Other | Admitting: Internal Medicine

## 2023-06-09 DIAGNOSIS — Z981 Arthrodesis status: Secondary | ICD-10-CM | POA: Diagnosis not present

## 2023-06-09 DIAGNOSIS — Z89512 Acquired absence of left leg below knee: Secondary | ICD-10-CM | POA: Diagnosis not present

## 2023-06-09 DIAGNOSIS — T23211D Burn of second degree of right thumb (nail), subsequent encounter: Secondary | ICD-10-CM | POA: Diagnosis not present

## 2023-06-09 DIAGNOSIS — L97518 Non-pressure chronic ulcer of other part of right foot with other specified severity: Secondary | ICD-10-CM | POA: Diagnosis not present

## 2023-06-09 DIAGNOSIS — E11621 Type 2 diabetes mellitus with foot ulcer: Secondary | ICD-10-CM

## 2023-06-09 DIAGNOSIS — Z89021 Acquired absence of right finger(s): Secondary | ICD-10-CM | POA: Diagnosis not present

## 2023-06-09 DIAGNOSIS — Z792 Long term (current) use of antibiotics: Secondary | ICD-10-CM | POA: Diagnosis not present

## 2023-06-09 DIAGNOSIS — T23211A Burn of second degree of right thumb (nail), initial encounter: Secondary | ICD-10-CM | POA: Diagnosis not present

## 2023-06-09 DIAGNOSIS — G4733 Obstructive sleep apnea (adult) (pediatric): Secondary | ICD-10-CM | POA: Diagnosis not present

## 2023-06-09 DIAGNOSIS — Z8614 Personal history of Methicillin resistant Staphylococcus aureus infection: Secondary | ICD-10-CM | POA: Diagnosis not present

## 2023-06-09 DIAGNOSIS — Z89022 Acquired absence of left finger(s): Secondary | ICD-10-CM | POA: Diagnosis not present

## 2023-06-15 ENCOUNTER — Encounter (HOSPITAL_BASED_OUTPATIENT_CLINIC_OR_DEPARTMENT_OTHER): Payer: Medicaid Other | Admitting: Internal Medicine

## 2023-06-16 ENCOUNTER — Ambulatory Visit (HOSPITAL_BASED_OUTPATIENT_CLINIC_OR_DEPARTMENT_OTHER): Payer: Medicaid Other | Admitting: Internal Medicine

## 2023-06-16 ENCOUNTER — Encounter (HOSPITAL_BASED_OUTPATIENT_CLINIC_OR_DEPARTMENT_OTHER): Payer: Medicaid Other | Admitting: Internal Medicine

## 2023-06-16 DIAGNOSIS — E11621 Type 2 diabetes mellitus with foot ulcer: Secondary | ICD-10-CM | POA: Diagnosis not present

## 2023-06-16 DIAGNOSIS — Z794 Long term (current) use of insulin: Secondary | ICD-10-CM | POA: Diagnosis not present

## 2023-06-16 DIAGNOSIS — Z89021 Acquired absence of right finger(s): Secondary | ICD-10-CM | POA: Diagnosis not present

## 2023-06-16 DIAGNOSIS — Z981 Arthrodesis status: Secondary | ICD-10-CM | POA: Diagnosis not present

## 2023-06-16 DIAGNOSIS — I499 Cardiac arrhythmia, unspecified: Secondary | ICD-10-CM | POA: Diagnosis not present

## 2023-06-16 DIAGNOSIS — E042 Nontoxic multinodular goiter: Secondary | ICD-10-CM | POA: Diagnosis not present

## 2023-06-16 DIAGNOSIS — L97518 Non-pressure chronic ulcer of other part of right foot with other specified severity: Secondary | ICD-10-CM | POA: Diagnosis not present

## 2023-06-16 DIAGNOSIS — Z89512 Acquired absence of left leg below knee: Secondary | ICD-10-CM | POA: Diagnosis not present

## 2023-06-16 DIAGNOSIS — E1140 Type 2 diabetes mellitus with diabetic neuropathy, unspecified: Secondary | ICD-10-CM | POA: Diagnosis not present

## 2023-06-16 DIAGNOSIS — Z8614 Personal history of Methicillin resistant Staphylococcus aureus infection: Secondary | ICD-10-CM | POA: Diagnosis not present

## 2023-06-16 DIAGNOSIS — Z89022 Acquired absence of left finger(s): Secondary | ICD-10-CM | POA: Diagnosis not present

## 2023-06-16 DIAGNOSIS — Z792 Long term (current) use of antibiotics: Secondary | ICD-10-CM | POA: Diagnosis not present

## 2023-06-16 DIAGNOSIS — T23211A Burn of second degree of right thumb (nail), initial encounter: Secondary | ICD-10-CM | POA: Diagnosis not present

## 2023-06-23 ENCOUNTER — Encounter (HOSPITAL_BASED_OUTPATIENT_CLINIC_OR_DEPARTMENT_OTHER): Payer: Medicaid Other | Admitting: Internal Medicine

## 2023-06-23 DIAGNOSIS — Z89022 Acquired absence of left finger(s): Secondary | ICD-10-CM | POA: Diagnosis not present

## 2023-06-23 DIAGNOSIS — Z981 Arthrodesis status: Secondary | ICD-10-CM | POA: Diagnosis not present

## 2023-06-23 DIAGNOSIS — Z89021 Acquired absence of right finger(s): Secondary | ICD-10-CM | POA: Diagnosis not present

## 2023-06-23 DIAGNOSIS — Z89512 Acquired absence of left leg below knee: Secondary | ICD-10-CM | POA: Diagnosis not present

## 2023-06-23 DIAGNOSIS — L97518 Non-pressure chronic ulcer of other part of right foot with other specified severity: Secondary | ICD-10-CM | POA: Diagnosis not present

## 2023-06-23 DIAGNOSIS — E11621 Type 2 diabetes mellitus with foot ulcer: Secondary | ICD-10-CM

## 2023-06-23 DIAGNOSIS — Z792 Long term (current) use of antibiotics: Secondary | ICD-10-CM | POA: Diagnosis not present

## 2023-06-23 DIAGNOSIS — T23211A Burn of second degree of right thumb (nail), initial encounter: Secondary | ICD-10-CM | POA: Diagnosis not present

## 2023-06-23 DIAGNOSIS — Z8614 Personal history of Methicillin resistant Staphylococcus aureus infection: Secondary | ICD-10-CM | POA: Diagnosis not present

## 2023-06-24 DIAGNOSIS — E119 Type 2 diabetes mellitus without complications: Secondary | ICD-10-CM | POA: Diagnosis not present

## 2023-06-24 DIAGNOSIS — Z89512 Acquired absence of left leg below knee: Secondary | ICD-10-CM | POA: Diagnosis not present

## 2023-06-24 DIAGNOSIS — M6788 Other specified disorders of synovium and tendon, other site: Secondary | ICD-10-CM | POA: Diagnosis not present

## 2023-06-24 DIAGNOSIS — E118 Type 2 diabetes mellitus with unspecified complications: Secondary | ICD-10-CM | POA: Diagnosis not present

## 2023-06-24 DIAGNOSIS — T8484XA Pain due to internal orthopedic prosthetic devices, implants and grafts, initial encounter: Secondary | ICD-10-CM | POA: Diagnosis not present

## 2023-06-24 DIAGNOSIS — M14671 Charcot's joint, right ankle and foot: Secondary | ICD-10-CM | POA: Diagnosis not present

## 2023-06-24 DIAGNOSIS — Z9889 Other specified postprocedural states: Secondary | ICD-10-CM | POA: Diagnosis not present

## 2023-06-24 DIAGNOSIS — L97312 Non-pressure chronic ulcer of right ankle with fat layer exposed: Secondary | ICD-10-CM | POA: Diagnosis not present

## 2023-06-24 DIAGNOSIS — Z981 Arthrodesis status: Secondary | ICD-10-CM | POA: Diagnosis not present

## 2023-06-30 ENCOUNTER — Ambulatory Visit (HOSPITAL_BASED_OUTPATIENT_CLINIC_OR_DEPARTMENT_OTHER): Payer: Medicaid Other | Admitting: Internal Medicine

## 2023-07-04 DIAGNOSIS — G4733 Obstructive sleep apnea (adult) (pediatric): Secondary | ICD-10-CM | POA: Diagnosis not present

## 2023-07-06 ENCOUNTER — Encounter (HOSPITAL_BASED_OUTPATIENT_CLINIC_OR_DEPARTMENT_OTHER): Payer: Medicaid Other | Attending: Internal Medicine | Admitting: Internal Medicine

## 2023-07-06 DIAGNOSIS — E1140 Type 2 diabetes mellitus with diabetic neuropathy, unspecified: Secondary | ICD-10-CM | POA: Diagnosis not present

## 2023-07-06 DIAGNOSIS — T23211D Burn of second degree of right thumb (nail), subsequent encounter: Secondary | ICD-10-CM | POA: Diagnosis not present

## 2023-07-06 DIAGNOSIS — L97818 Non-pressure chronic ulcer of other part of right lower leg with other specified severity: Secondary | ICD-10-CM | POA: Insufficient documentation

## 2023-07-06 DIAGNOSIS — Z89512 Acquired absence of left leg below knee: Secondary | ICD-10-CM | POA: Insufficient documentation

## 2023-07-06 DIAGNOSIS — E11621 Type 2 diabetes mellitus with foot ulcer: Secondary | ICD-10-CM | POA: Insufficient documentation

## 2023-07-06 DIAGNOSIS — X088XXD Exposure to other specified smoke, fire and flames, subsequent encounter: Secondary | ICD-10-CM | POA: Insufficient documentation

## 2023-07-06 DIAGNOSIS — L97518 Non-pressure chronic ulcer of other part of right foot with other specified severity: Secondary | ICD-10-CM

## 2023-07-06 DIAGNOSIS — I493 Ventricular premature depolarization: Secondary | ICD-10-CM | POA: Diagnosis not present

## 2023-07-12 ENCOUNTER — Encounter (HOSPITAL_BASED_OUTPATIENT_CLINIC_OR_DEPARTMENT_OTHER): Admitting: Internal Medicine

## 2023-07-12 DIAGNOSIS — L97818 Non-pressure chronic ulcer of other part of right lower leg with other specified severity: Secondary | ICD-10-CM | POA: Diagnosis not present

## 2023-07-12 DIAGNOSIS — E11621 Type 2 diabetes mellitus with foot ulcer: Secondary | ICD-10-CM | POA: Diagnosis not present

## 2023-07-12 DIAGNOSIS — Z89512 Acquired absence of left leg below knee: Secondary | ICD-10-CM | POA: Diagnosis not present

## 2023-07-12 DIAGNOSIS — E1140 Type 2 diabetes mellitus with diabetic neuropathy, unspecified: Secondary | ICD-10-CM | POA: Diagnosis not present

## 2023-07-12 DIAGNOSIS — L97518 Non-pressure chronic ulcer of other part of right foot with other specified severity: Secondary | ICD-10-CM | POA: Diagnosis not present

## 2023-07-12 DIAGNOSIS — T23211D Burn of second degree of right thumb (nail), subsequent encounter: Secondary | ICD-10-CM | POA: Diagnosis not present

## 2023-07-14 DIAGNOSIS — G4733 Obstructive sleep apnea (adult) (pediatric): Secondary | ICD-10-CM | POA: Diagnosis not present

## 2023-07-21 ENCOUNTER — Encounter (HOSPITAL_BASED_OUTPATIENT_CLINIC_OR_DEPARTMENT_OTHER): Admitting: Internal Medicine

## 2023-07-21 DIAGNOSIS — L97518 Non-pressure chronic ulcer of other part of right foot with other specified severity: Secondary | ICD-10-CM | POA: Diagnosis not present

## 2023-07-21 DIAGNOSIS — T23211D Burn of second degree of right thumb (nail), subsequent encounter: Secondary | ICD-10-CM | POA: Diagnosis not present

## 2023-07-21 DIAGNOSIS — E11621 Type 2 diabetes mellitus with foot ulcer: Secondary | ICD-10-CM

## 2023-07-21 DIAGNOSIS — L97818 Non-pressure chronic ulcer of other part of right lower leg with other specified severity: Secondary | ICD-10-CM | POA: Diagnosis not present

## 2023-07-21 DIAGNOSIS — Z89512 Acquired absence of left leg below knee: Secondary | ICD-10-CM | POA: Diagnosis not present

## 2023-07-21 DIAGNOSIS — E1140 Type 2 diabetes mellitus with diabetic neuropathy, unspecified: Secondary | ICD-10-CM | POA: Diagnosis not present

## 2023-07-26 ENCOUNTER — Encounter (HOSPITAL_BASED_OUTPATIENT_CLINIC_OR_DEPARTMENT_OTHER): Admitting: Internal Medicine

## 2023-07-29 ENCOUNTER — Encounter (HOSPITAL_BASED_OUTPATIENT_CLINIC_OR_DEPARTMENT_OTHER): Attending: Internal Medicine | Admitting: Internal Medicine

## 2023-07-29 DIAGNOSIS — L97518 Non-pressure chronic ulcer of other part of right foot with other specified severity: Secondary | ICD-10-CM

## 2023-07-29 DIAGNOSIS — Z89022 Acquired absence of left finger(s): Secondary | ICD-10-CM | POA: Insufficient documentation

## 2023-07-29 DIAGNOSIS — E11621 Type 2 diabetes mellitus with foot ulcer: Secondary | ICD-10-CM | POA: Diagnosis not present

## 2023-07-29 DIAGNOSIS — Z89512 Acquired absence of left leg below knee: Secondary | ICD-10-CM | POA: Diagnosis not present

## 2023-07-29 DIAGNOSIS — Z89021 Acquired absence of right finger(s): Secondary | ICD-10-CM | POA: Insufficient documentation

## 2023-07-29 DIAGNOSIS — Z9889 Other specified postprocedural states: Secondary | ICD-10-CM | POA: Insufficient documentation

## 2023-08-02 DIAGNOSIS — N1832 Chronic kidney disease, stage 3b: Secondary | ICD-10-CM | POA: Diagnosis not present

## 2023-08-02 DIAGNOSIS — E118 Type 2 diabetes mellitus with unspecified complications: Secondary | ICD-10-CM | POA: Diagnosis not present

## 2023-08-02 DIAGNOSIS — E11610 Type 2 diabetes mellitus with diabetic neuropathic arthropathy: Secondary | ICD-10-CM | POA: Diagnosis not present

## 2023-08-02 DIAGNOSIS — E1140 Type 2 diabetes mellitus with diabetic neuropathy, unspecified: Secondary | ICD-10-CM | POA: Diagnosis not present

## 2023-08-02 DIAGNOSIS — G4733 Obstructive sleep apnea (adult) (pediatric): Secondary | ICD-10-CM | POA: Diagnosis not present

## 2023-08-02 DIAGNOSIS — Z794 Long term (current) use of insulin: Secondary | ICD-10-CM | POA: Diagnosis not present

## 2023-08-02 DIAGNOSIS — G894 Chronic pain syndrome: Secondary | ICD-10-CM | POA: Diagnosis not present

## 2023-08-05 ENCOUNTER — Encounter (HOSPITAL_BASED_OUTPATIENT_CLINIC_OR_DEPARTMENT_OTHER): Admitting: Internal Medicine

## 2023-08-05 DIAGNOSIS — Z9889 Other specified postprocedural states: Secondary | ICD-10-CM | POA: Diagnosis not present

## 2023-08-05 DIAGNOSIS — Z89022 Acquired absence of left finger(s): Secondary | ICD-10-CM | POA: Diagnosis not present

## 2023-08-05 DIAGNOSIS — Z89512 Acquired absence of left leg below knee: Secondary | ICD-10-CM | POA: Diagnosis not present

## 2023-08-05 DIAGNOSIS — L97518 Non-pressure chronic ulcer of other part of right foot with other specified severity: Secondary | ICD-10-CM | POA: Diagnosis not present

## 2023-08-05 DIAGNOSIS — E11621 Type 2 diabetes mellitus with foot ulcer: Secondary | ICD-10-CM | POA: Diagnosis not present

## 2023-08-05 DIAGNOSIS — Z89021 Acquired absence of right finger(s): Secondary | ICD-10-CM | POA: Diagnosis not present

## 2023-08-11 DIAGNOSIS — G4733 Obstructive sleep apnea (adult) (pediatric): Secondary | ICD-10-CM | POA: Diagnosis not present

## 2023-08-12 ENCOUNTER — Encounter (HOSPITAL_BASED_OUTPATIENT_CLINIC_OR_DEPARTMENT_OTHER): Admitting: Internal Medicine

## 2023-08-12 DIAGNOSIS — Z89512 Acquired absence of left leg below knee: Secondary | ICD-10-CM | POA: Diagnosis not present

## 2023-08-12 DIAGNOSIS — L97518 Non-pressure chronic ulcer of other part of right foot with other specified severity: Secondary | ICD-10-CM | POA: Diagnosis not present

## 2023-08-12 DIAGNOSIS — E11621 Type 2 diabetes mellitus with foot ulcer: Secondary | ICD-10-CM | POA: Diagnosis not present

## 2023-08-12 DIAGNOSIS — Z89021 Acquired absence of right finger(s): Secondary | ICD-10-CM | POA: Diagnosis not present

## 2023-08-12 DIAGNOSIS — Z9889 Other specified postprocedural states: Secondary | ICD-10-CM | POA: Diagnosis not present

## 2023-08-12 DIAGNOSIS — Z89022 Acquired absence of left finger(s): Secondary | ICD-10-CM | POA: Diagnosis not present

## 2023-08-19 ENCOUNTER — Encounter (HOSPITAL_BASED_OUTPATIENT_CLINIC_OR_DEPARTMENT_OTHER): Admitting: Internal Medicine

## 2023-08-19 DIAGNOSIS — Z9889 Other specified postprocedural states: Secondary | ICD-10-CM | POA: Diagnosis not present

## 2023-08-19 DIAGNOSIS — L97518 Non-pressure chronic ulcer of other part of right foot with other specified severity: Secondary | ICD-10-CM | POA: Diagnosis not present

## 2023-08-19 DIAGNOSIS — E11621 Type 2 diabetes mellitus with foot ulcer: Secondary | ICD-10-CM

## 2023-08-19 DIAGNOSIS — Z89022 Acquired absence of left finger(s): Secondary | ICD-10-CM | POA: Diagnosis not present

## 2023-08-19 DIAGNOSIS — Z89512 Acquired absence of left leg below knee: Secondary | ICD-10-CM | POA: Diagnosis not present

## 2023-08-19 DIAGNOSIS — Z89021 Acquired absence of right finger(s): Secondary | ICD-10-CM | POA: Diagnosis not present

## 2023-08-26 ENCOUNTER — Ambulatory Visit (HOSPITAL_BASED_OUTPATIENT_CLINIC_OR_DEPARTMENT_OTHER): Admitting: Internal Medicine

## 2023-09-01 ENCOUNTER — Encounter (HOSPITAL_BASED_OUTPATIENT_CLINIC_OR_DEPARTMENT_OTHER): Attending: Internal Medicine | Admitting: Internal Medicine

## 2023-09-01 DIAGNOSIS — L97518 Non-pressure chronic ulcer of other part of right foot with other specified severity: Secondary | ICD-10-CM | POA: Insufficient documentation

## 2023-09-01 DIAGNOSIS — E11621 Type 2 diabetes mellitus with foot ulcer: Secondary | ICD-10-CM | POA: Diagnosis not present

## 2023-09-01 DIAGNOSIS — Z89512 Acquired absence of left leg below knee: Secondary | ICD-10-CM | POA: Insufficient documentation

## 2023-09-07 ENCOUNTER — Ambulatory Visit (HOSPITAL_BASED_OUTPATIENT_CLINIC_OR_DEPARTMENT_OTHER): Admitting: Internal Medicine

## 2023-09-07 DIAGNOSIS — G4733 Obstructive sleep apnea (adult) (pediatric): Secondary | ICD-10-CM | POA: Diagnosis not present

## 2023-09-10 DIAGNOSIS — G4733 Obstructive sleep apnea (adult) (pediatric): Secondary | ICD-10-CM | POA: Diagnosis not present

## 2023-09-15 ENCOUNTER — Ambulatory Visit (HOSPITAL_BASED_OUTPATIENT_CLINIC_OR_DEPARTMENT_OTHER): Admitting: Internal Medicine

## 2023-09-21 DIAGNOSIS — G4733 Obstructive sleep apnea (adult) (pediatric): Secondary | ICD-10-CM | POA: Diagnosis not present

## 2023-09-22 ENCOUNTER — Ambulatory Visit (HOSPITAL_BASED_OUTPATIENT_CLINIC_OR_DEPARTMENT_OTHER): Admitting: Internal Medicine

## 2023-09-29 DIAGNOSIS — E1169 Type 2 diabetes mellitus with other specified complication: Secondary | ICD-10-CM | POA: Diagnosis not present

## 2023-09-29 DIAGNOSIS — E042 Nontoxic multinodular goiter: Secondary | ICD-10-CM | POA: Diagnosis not present

## 2023-09-29 DIAGNOSIS — E785 Hyperlipidemia, unspecified: Secondary | ICD-10-CM | POA: Diagnosis not present

## 2023-09-29 DIAGNOSIS — N183 Chronic kidney disease, stage 3 unspecified: Secondary | ICD-10-CM | POA: Diagnosis not present

## 2023-09-29 DIAGNOSIS — N1831 Chronic kidney disease, stage 3a: Secondary | ICD-10-CM | POA: Diagnosis not present

## 2023-09-29 DIAGNOSIS — Z794 Long term (current) use of insulin: Secondary | ICD-10-CM | POA: Diagnosis not present

## 2023-09-29 DIAGNOSIS — I129 Hypertensive chronic kidney disease with stage 1 through stage 4 chronic kidney disease, or unspecified chronic kidney disease: Secondary | ICD-10-CM | POA: Diagnosis not present

## 2023-09-29 DIAGNOSIS — E1122 Type 2 diabetes mellitus with diabetic chronic kidney disease: Secondary | ICD-10-CM | POA: Diagnosis not present

## 2023-09-29 DIAGNOSIS — E1140 Type 2 diabetes mellitus with diabetic neuropathy, unspecified: Secondary | ICD-10-CM | POA: Diagnosis not present

## 2023-09-30 ENCOUNTER — Ambulatory Visit (HOSPITAL_BASED_OUTPATIENT_CLINIC_OR_DEPARTMENT_OTHER): Admitting: Internal Medicine

## 2023-10-11 DIAGNOSIS — G4733 Obstructive sleep apnea (adult) (pediatric): Secondary | ICD-10-CM | POA: Diagnosis not present

## 2023-10-20 DIAGNOSIS — M25531 Pain in right wrist: Secondary | ICD-10-CM | POA: Diagnosis not present

## 2023-11-01 DIAGNOSIS — M14671 Charcot's joint, right ankle and foot: Secondary | ICD-10-CM | POA: Diagnosis not present

## 2023-11-01 DIAGNOSIS — E118 Type 2 diabetes mellitus with unspecified complications: Secondary | ICD-10-CM | POA: Diagnosis not present

## 2023-11-01 DIAGNOSIS — E785 Hyperlipidemia, unspecified: Secondary | ICD-10-CM | POA: Diagnosis not present

## 2023-11-01 DIAGNOSIS — Z89512 Acquired absence of left leg below knee: Secondary | ICD-10-CM | POA: Diagnosis not present

## 2023-11-01 DIAGNOSIS — G8929 Other chronic pain: Secondary | ICD-10-CM | POA: Diagnosis not present

## 2023-11-10 DIAGNOSIS — G4733 Obstructive sleep apnea (adult) (pediatric): Secondary | ICD-10-CM | POA: Diagnosis not present

## 2023-11-10 DIAGNOSIS — J029 Acute pharyngitis, unspecified: Secondary | ICD-10-CM | POA: Diagnosis not present

## 2023-11-10 DIAGNOSIS — Z6841 Body Mass Index (BMI) 40.0 and over, adult: Secondary | ICD-10-CM | POA: Diagnosis not present

## 2023-11-11 DIAGNOSIS — G4733 Obstructive sleep apnea (adult) (pediatric): Secondary | ICD-10-CM | POA: Diagnosis not present

## 2023-11-17 DIAGNOSIS — M79644 Pain in right finger(s): Secondary | ICD-10-CM | POA: Diagnosis not present

## 2023-11-19 DIAGNOSIS — S61202A Unspecified open wound of right middle finger without damage to nail, initial encounter: Secondary | ICD-10-CM | POA: Diagnosis not present

## 2023-11-24 DIAGNOSIS — Z89512 Acquired absence of left leg below knee: Secondary | ICD-10-CM | POA: Diagnosis not present

## 2023-11-25 ENCOUNTER — Encounter (HOSPITAL_BASED_OUTPATIENT_CLINIC_OR_DEPARTMENT_OTHER): Payer: Self-pay | Admitting: Orthopedic Surgery

## 2023-11-25 NOTE — Progress Notes (Signed)
 Reviewed cardiac hx/ testing with Dr. Tilford. Okay to proceed with surgery as scheduled without further clearances.

## 2023-11-29 ENCOUNTER — Encounter (HOSPITAL_BASED_OUTPATIENT_CLINIC_OR_DEPARTMENT_OTHER)
Admission: RE | Admit: 2023-11-29 | Discharge: 2023-11-29 | Disposition: A | Discharge status: Home / Self Care | Source: Ambulatory Visit | Attending: Orthopedic Surgery

## 2023-11-29 DIAGNOSIS — Z01818 Encounter for other preprocedural examination: Secondary | ICD-10-CM | POA: Insufficient documentation

## 2023-11-29 LAB — BASIC METABOLIC PANEL WITH GFR
Anion gap: 10 (ref 5–15)
BUN: 17 mg/dL (ref 6–20)
CO2: 25 mmol/L (ref 22–32)
Calcium: 10.1 mg/dL (ref 8.9–10.3)
Chloride: 106 mmol/L (ref 98–111)
Creatinine, Ser: 1.22 mg/dL — ABNORMAL HIGH (ref 0.44–1.00)
GFR, Estimated: 52 mL/min — ABNORMAL LOW (ref >60)
Glucose, Bld: 230 mg/dL — ABNORMAL HIGH (ref 70–99)
Potassium: 5 mmol/L (ref 3.5–5.1)
Sodium: 141 mmol/L (ref 135–145)

## 2023-12-01 ENCOUNTER — Other Ambulatory Visit: Payer: Self-pay

## 2023-12-01 ENCOUNTER — Ambulatory Visit (HOSPITAL_BASED_OUTPATIENT_CLINIC_OR_DEPARTMENT_OTHER)
Admission: RE | Admit: 2023-12-01 | Discharge: 2023-12-01 | Disposition: A | Discharge status: Home / Self Care | Attending: Orthopedic Surgery | Admitting: Orthopedic Surgery

## 2023-12-01 ENCOUNTER — Encounter (HOSPITAL_BASED_OUTPATIENT_CLINIC_OR_DEPARTMENT_OTHER)
Admission: RE | Disposition: A | Discharge status: Home / Self Care | Payer: Self-pay | Source: Home / Self Care | Attending: Orthopedic Surgery

## 2023-12-01 ENCOUNTER — Ambulatory Visit (HOSPITAL_BASED_OUTPATIENT_CLINIC_OR_DEPARTMENT_OTHER)

## 2023-12-01 ENCOUNTER — Ambulatory Visit (HOSPITAL_BASED_OUTPATIENT_CLINIC_OR_DEPARTMENT_OTHER): Payer: Self-pay | Admitting: Certified Registered"

## 2023-12-01 ENCOUNTER — Encounter (HOSPITAL_BASED_OUTPATIENT_CLINIC_OR_DEPARTMENT_OTHER): Payer: Self-pay | Admitting: Orthopedic Surgery

## 2023-12-01 DIAGNOSIS — S61202A Unspecified open wound of right middle finger without damage to nail, initial encounter: Secondary | ICD-10-CM | POA: Insufficient documentation

## 2023-12-01 DIAGNOSIS — X58XXXA Exposure to other specified factors, initial encounter: Secondary | ICD-10-CM | POA: Insufficient documentation

## 2023-12-01 DIAGNOSIS — E1159 Type 2 diabetes mellitus with other circulatory complications: Secondary | ICD-10-CM | POA: Insufficient documentation

## 2023-12-01 DIAGNOSIS — S61209A Unspecified open wound of unspecified finger without damage to nail, initial encounter: Secondary | ICD-10-CM | POA: Diagnosis present

## 2023-12-01 DIAGNOSIS — G4733 Obstructive sleep apnea (adult) (pediatric): Secondary | ICD-10-CM | POA: Diagnosis not present

## 2023-12-01 DIAGNOSIS — Z01818 Encounter for other preprocedural examination: Secondary | ICD-10-CM

## 2023-12-01 DIAGNOSIS — G4730 Sleep apnea, unspecified: Secondary | ICD-10-CM | POA: Diagnosis not present

## 2023-12-01 DIAGNOSIS — E785 Hyperlipidemia, unspecified: Secondary | ICD-10-CM | POA: Insufficient documentation

## 2023-12-01 DIAGNOSIS — E11610 Type 2 diabetes mellitus with diabetic neuropathic arthropathy: Secondary | ICD-10-CM | POA: Diagnosis not present

## 2023-12-01 DIAGNOSIS — Z7985 Long-term (current) use of injectable non-insulin antidiabetic drugs: Secondary | ICD-10-CM | POA: Insufficient documentation

## 2023-12-01 DIAGNOSIS — Z6841 Body Mass Index (BMI) 40.0 and over, adult: Secondary | ICD-10-CM | POA: Diagnosis not present

## 2023-12-01 DIAGNOSIS — Z794 Long term (current) use of insulin: Secondary | ICD-10-CM | POA: Insufficient documentation

## 2023-12-01 DIAGNOSIS — E119 Type 2 diabetes mellitus without complications: Secondary | ICD-10-CM | POA: Diagnosis not present

## 2023-12-01 DIAGNOSIS — E1165 Type 2 diabetes mellitus with hyperglycemia: Secondary | ICD-10-CM | POA: Diagnosis not present

## 2023-12-01 DIAGNOSIS — K219 Gastro-esophageal reflux disease without esophagitis: Secondary | ICD-10-CM | POA: Insufficient documentation

## 2023-12-01 HISTORY — PX: AMPUTATION FINGER: SHX6594

## 2023-12-01 HISTORY — DX: Cardiac murmur, unspecified: R01.1

## 2023-12-01 LAB — GLUCOSE, CAPILLARY
Glucose-Capillary: 174 mg/dL — ABNORMAL HIGH (ref 70–99)
Glucose-Capillary: 135 mg/dL — ABNORMAL HIGH (ref 70–99)

## 2023-12-01 SURGERY — AMPUTATION, FINGER
Anesthesia: Monitor Anesthesia Care | Site: Middle Finger | Laterality: Right

## 2023-12-01 MED ORDER — FENTANYL CITRATE (PF) 100 MCG/2ML IJ SOLN
INTRAMUSCULAR | Status: AC
  Filled 2023-12-01: qty 2

## 2023-12-01 MED ORDER — CEFAZOLIN SODIUM-DEXTROSE 2-4 GM/100ML-% IV SOLN
INTRAVENOUS | Status: AC
  Filled 2023-12-01: qty 100

## 2023-12-01 MED ORDER — ONDANSETRON HCL 4 MG/2ML IJ SOLN
INTRAMUSCULAR | Status: AC
  Filled 2023-12-01: qty 2

## 2023-12-01 MED ORDER — EPHEDRINE SULFATE (PRESSORS) 50 MG/ML IJ SOLN
INTRAMUSCULAR | Status: DC | PRN
  Administered 2023-12-01 (×2): 5 mg via INTRAVENOUS

## 2023-12-01 MED ORDER — OXYCODONE HCL 5 MG PO TABS: 5.0000 mg | ORAL_TABLET | Freq: Once | ORAL | Status: DC | PRN

## 2023-12-01 MED ORDER — BACITRACIN ZINC 500 UNIT/GM EX OINT
TOPICAL_OINTMENT | CUTANEOUS | Status: DC | PRN
  Administered 2023-12-01: 1 via TOPICAL

## 2023-12-01 MED ORDER — MIDAZOLAM HCL 2 MG/2ML IJ SOLN
INTRAMUSCULAR | Status: AC
  Filled 2023-12-01: qty 2

## 2023-12-01 MED ORDER — PROPOFOL 500 MG/50ML IV EMUL
INTRAVENOUS | Status: DC | PRN
Start: 2023-12-01 — End: 2023-12-01
  Administered 2023-12-01: 100 ug/kg/min via INTRAVENOUS

## 2023-12-01 MED ORDER — OXYCODONE HCL 5 MG/5ML PO SOLN: 5.0000 mg | Freq: Once | ORAL | Status: DC | PRN

## 2023-12-01 MED ORDER — FENTANYL CITRATE (PF) 100 MCG/2ML IJ SOLN: 25.0000 ug | INTRAMUSCULAR | Status: DC | PRN

## 2023-12-01 MED ORDER — OXYCODONE HCL 5 MG PO TABS
5.0000 mg | ORAL_TABLET | Freq: Four times a day (QID) | ORAL | 0 refills | Status: AC | PRN
Start: 2023-12-01 — End: 2023-12-08

## 2023-12-01 MED ORDER — SULFAMETHOXAZOLE-TRIMETHOPRIM 800-160 MG PO TABS: 1.0000 | ORAL_TABLET | Freq: Two times a day (BID) | ORAL | 0 refills | Status: AC

## 2023-12-01 MED ORDER — LIDOCAINE HCL (PF) 1 % IJ SOLN
INTRAMUSCULAR | Status: AC
  Filled 2023-12-01: qty 60

## 2023-12-01 MED ORDER — MIDAZOLAM HCL 2 MG/2ML IJ SOLN
INTRAMUSCULAR | Status: DC | PRN
  Administered 2023-12-01: 2 mg via INTRAVENOUS

## 2023-12-01 MED ORDER — FENTANYL CITRATE (PF) 100 MCG/2ML IJ SOLN
INTRAMUSCULAR | Status: DC | PRN
  Administered 2023-12-01: 50 ug via INTRAVENOUS

## 2023-12-01 MED ORDER — LACTATED RINGERS IV SOLN: INTRAVENOUS | Status: DC

## 2023-12-01 MED ORDER — BACITRACIN ZINC 500 UNIT/GM EX OINT
TOPICAL_OINTMENT | CUTANEOUS | Status: AC
  Filled 2023-12-01: qty 28.35

## 2023-12-01 MED ORDER — ONDANSETRON HCL 4 MG/2ML IJ SOLN: 4.0000 mg | Freq: Once | INTRAMUSCULAR | Status: DC | PRN

## 2023-12-01 MED ORDER — BUPIVACAINE HCL (PF) 0.25 % IJ SOLN
INTRAMUSCULAR | Status: DC | PRN
  Administered 2023-12-01: 10 mL

## 2023-12-01 MED ORDER — 0.9 % SODIUM CHLORIDE (POUR BTL) OPTIME
TOPICAL | Status: DC | PRN
Start: 2023-12-01 — End: 2023-12-01
  Administered 2023-12-01: 1000 mL

## 2023-12-01 MED ORDER — DEXMEDETOMIDINE HCL IN NACL 80 MCG/20ML IV SOLN
INTRAVENOUS | Status: DC | PRN
  Administered 2023-12-01: 4 ug via INTRAVENOUS
  Administered 2023-12-01: 8 ug via INTRAVENOUS

## 2023-12-01 MED ORDER — ONDANSETRON HCL 4 MG/2ML IJ SOLN
INTRAMUSCULAR | Status: DC | PRN
  Administered 2023-12-01: 4 mg via INTRAVENOUS

## 2023-12-01 MED ORDER — ACETAMINOPHEN 10 MG/ML IV SOLN: 1000.0000 mg | Freq: Once | INTRAVENOUS | Status: DC | PRN

## 2023-12-01 MED ORDER — CEFAZOLIN SODIUM-DEXTROSE 2-4 GM/100ML-% IV SOLN
2.0000 g | INTRAVENOUS | Status: AC
  Administered 2023-12-01: 2 g via INTRAVENOUS

## 2023-12-01 MED ORDER — EPHEDRINE 5 MG/ML INJ
INTRAVENOUS | Status: AC
  Filled 2023-12-01: qty 5

## 2023-12-01 SURGICAL SUPPLY — 38 items
BLADE AVERAGE 25X9 (BLADE) IMPLANT
BLADE OSC/SAG .038X5.5 CUT EDG (BLADE) IMPLANT
BLADE SURG 15 STRL LF DISP TIS (BLADE) ×1 IMPLANT
BNDG COHESIVE 1X5 TAN STRL LF (GAUZE/BANDAGES/DRESSINGS) IMPLANT
BNDG COMPR ESMARK 4X3 LF (GAUZE/BANDAGES/DRESSINGS) IMPLANT
BNDG ELASTIC 2INX 5YD STR LF (GAUZE/BANDAGES/DRESSINGS) IMPLANT
BNDG ELASTIC 3INX 5YD STR LF (GAUZE/BANDAGES/DRESSINGS) IMPLANT
BNDG GAUZE DERMACEA FLUFF 4 (GAUZE/BANDAGES/DRESSINGS) IMPLANT
CHLORAPREP W/TINT 26 (MISCELLANEOUS) IMPLANT
CORD BIPOLAR FORCEPS 12FT (ELECTRODE) ×1 IMPLANT
COVER BACK TABLE 60X90IN (DRAPES) ×1 IMPLANT
CUFF TOURN SGL QUICK 18X4 (TOURNIQUET CUFF) IMPLANT
CUFF TRNQT CYL 24X4X16.5-23 (TOURNIQUET CUFF) IMPLANT
DRAIN PENROSE 12X.25 LTX STRL (MISCELLANEOUS) IMPLANT
DRAPE EXTREMITY T 121X128X90 (DISPOSABLE) ×1 IMPLANT
DRAPE OEC MINIVIEW 54X84 (DRAPES) IMPLANT
DRAPE SURG 17X23 STRL (DRAPES) ×1 IMPLANT
GAUZE SPONGE 4X4 12PLY STRL (GAUZE/BANDAGES/DRESSINGS) ×1 IMPLANT
GAUZE XEROFORM 1X8 LF (GAUZE/BANDAGES/DRESSINGS) ×1 IMPLANT
GLOVE BIO SURGEON STRL SZ7 (GLOVE) ×1 IMPLANT
GLOVE BIOGEL PI IND STRL 7.0 (GLOVE) ×1 IMPLANT
GOWN STRL REUS W/ TWL LRG LVL3 (GOWN DISPOSABLE) ×2 IMPLANT
NDL HYPO 25X1 1.5 SAFETY (NEEDLE) IMPLANT
NEEDLE HYPO 25X1 1.5 SAFETY (NEEDLE) IMPLANT
NS IRRIG 1000ML POUR BTL (IV SOLUTION) ×1 IMPLANT
PACK BASIN DAY SURGERY FS (CUSTOM PROCEDURE TRAY) ×1 IMPLANT
PAD CAST 3X4 CTTN HI CHSV (CAST SUPPLIES) ×1 IMPLANT
SHEET MEDIUM DRAPE 40X70 STRL (DRAPES) ×1 IMPLANT
SLEEVE SCD COMPRESS KNEE MED (STOCKING) IMPLANT
SUT ETHILON 4 0 PS 2 18 (SUTURE) ×1 IMPLANT
SUT MNCRL AB 3-0 PS2 18 (SUTURE) IMPLANT
SUT VIC AB 4-0 PS2 18 (SUTURE) IMPLANT
SUT VICRYL RAPIDE 4/0 PS 2 (SUTURE) ×1 IMPLANT
SYR BULB EAR ULCER 3OZ GRN STR (SYRINGE) ×1 IMPLANT
SYR CONTROL 10ML LL (SYRINGE) IMPLANT
TOWEL GREEN STERILE FF (TOWEL DISPOSABLE) ×2 IMPLANT
TRAY DSU PREP LF (CUSTOM PROCEDURE TRAY) IMPLANT
UNDERPAD 30X36 HEAVY ABSORB (UNDERPADS AND DIAPERS) ×1 IMPLANT

## 2023-12-01 NOTE — Anesthesia Postprocedure Evaluation (Signed)
 Anesthesia Post Note  Patient: Anahla R Rasheed  Procedure(s) Performed: AMPUTATION, FINGER (Right: Middle Finger)     Patient location during evaluation: PACU Anesthesia Type: MAC Level of consciousness: awake and alert Pain management: pain level controlled Vital Signs Assessment: post-procedure vital signs reviewed and stable Respiratory status: spontaneous breathing, nonlabored ventilation, respiratory function stable and patient connected to nasal cannula oxygen Cardiovascular status: stable and blood pressure returned to baseline Postop Assessment: no apparent nausea or vomiting Anesthetic complications: no   No notable events documented.  Last Vitals:  Vitals:   12/01/23 1215 12/01/23 1230  BP: (!) 102/59 116/64  Pulse: 60 73  Resp: (!) 9 16  Temp:  (!) 36.4 C  SpO2: 99% 98%    Last Pain:  Vitals:   12/01/23 1230  TempSrc:   PainSc: 0-No pain                 Lynwood MARLA Cornea

## 2023-12-01 NOTE — Interval H&P Note (Signed)
 History and Physical Interval Note:  12/01/2023 11:01 AM  [name]  has presented today for surgery, with the diagnosis of Right middle finger wound.  The various methods of treatment have been discussed with the patient and family. After consideration of risks, benefits and other options for treatment, the patient has consented to  Procedure(s) with comments: AMPUTATION, FINGER (Right) - Right middle finger amputation as a surgical intervention.  The patient's history has been reviewed, patient examined, no change in status, stable for surgery.  I have reviewed the patient's chart and labs.  Questions were answered to the patient's satisfaction.     Chayna Surratt

## 2023-12-01 NOTE — Discharge Instructions (Addendum)
Waylan Rocher, M.D. Hand Surgery  POST-OPERATIVE DISCHARGE INSTRUCTIONS   PRESCRIPTIONS: You may have been given a prescription to be taken as directed for post-operative pain control.  You may also take over the counter ibuprofen/aleve and tylenol for pain. Take this as directed on the packaging. Do not exceed 3000 mg tylenol/acetaminophen in 24 hours.  Ibuprofen 600-800 mg (3-4) tablets by mouth every 6 hours as needed for pain.  OR Aleve 2 tablets by mouth every 12 hours (twice daily) as needed for pain.  AND/OR Tylenol 1000 mg (2 tablets) every 8 hours as needed for pain.  Please use your pain medication carefully, as refills are limited and you may not be provided with one.  As stated above, please use over the counter pain medicine - it will also be helpful with decreasing your swelling.    ANESTHESIA: After your surgery, post-surgical discomfort or pain is likely. This discomfort can last several days to a few weeks. At certain times of the day your discomfort may be more intense.   Did you receive a nerve block?  A nerve block can provide pain relief for one hour to two days after your surgery. As long as the nerve block is working, you will experience little or no sensation in the area the surgeon operated on.  As the nerve block wears off, you will begin to experience pain or discomfort. It is very important that you begin taking your prescribed pain medication before the nerve block fully wears off. Treating your pain at the first sign of the block wearing off will ensure your pain is better controlled and more tolerable when full-sensation returns. Do not wait until the pain is intolerable, as the medicine will be less effective. It is better to treat pain in advance than to try and catch up.   General Anesthesia:  If you did not receive a nerve block during your surgery, you will need to start taking your pain medication shortly after your surgery and should continue  to do so as prescribed by your surgeon.     ICE AND ELEVATION: You may use ice for the first 48-72 hours, but it is not critical.   Motion of your fingers is very important to decrease the swelling.  Elevation, as much as possible for the next 48 hours, is critical for decreasing swelling as well as for pain relief. Elevation means when you are seated or lying down, you hand should be at or above your heart. When walking, the hand needs to be at or above the level of your elbow.  If the bandage gets too tight, it may need to be loosened. Please contact our office and we will instruct you in how to do this.    SURGICAL BANDAGES:  Keep your dressing and/or splint clean and dry at all times.  Do not remove until you are seen again in the office.  If careful, you may place a plastic bag over your bandage and tape the end to shower, but be careful, do not get your bandages wet.     HAND THERAPY:  You may not need any. If you do, we will begin this at your follow up visit in the clinic.    ACTIVITY AND WORK: You are encouraged to move any fingers which are not in the bandage.  Light use of the fingers is allowed to assist the other hand with daily hygiene and eating, but strong gripping or lifting is often uncomfortable and  should be avoided.  You might miss a variable period of time from work and hopefully this issue has been discussed prior to surgery. You may not do any heavy work with your affected hand for about 2 weeks.    [facility] Second Floor, [address] [place], [place] [address] [phone]    Post Anesthesia Home Care Instructions  Activity: Get plenty of rest for the remainder of the day. A responsible individual must stay with you for 24 hours following the procedure.  For the next 24 hours, DO NOT: -Drive a car -Advertising copywriter -Drink alcoholic beverages -Take any medication unless instructed by your physician -Make any legal decisions or sign  important papers.  Meals: Start with liquid foods such as gelatin or soup. Progress to regular foods as tolerated. Avoid greasy, spicy, heavy foods. If nausea and/or vomiting occur, drink only clear liquids until the nausea and/or vomiting subsides. Call your physician if vomiting continues.  Special Instructions/Symptoms: Your throat may feel dry or sore from the anesthesia or the breathing tube placed in your throat during surgery. If this causes discomfort, gargle with warm salt water. The discomfort should disappear within 24 hours.  If you had a scopolamine patch placed behind your ear for the management of post- operative nausea and/or vomiting:  1. The medication in the patch is effective for 72 hours, after which it should be removed.  Wrap patch in a tissue and discard in the trash. Wash hands thoroughly with soap and water. 2. You may remove the patch earlier than 72 hours if you experience unpleasant side effects which may include dry mouth, dizziness or visual disturbances. 3. Avoid touching the patch. Wash your hands with soap and water after contact with the patch.

## 2023-12-01 NOTE — H&P (Signed)
 HAND SURGERY   HPI: Patient is a 58 y.o. female who presents with a wound at the volar aspect of the right middle finger tip.  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  She is status post amputation of this right middle finger through the DIP joint.  She notes that the pain in this finger has been continuous.  She is constantly hitting or striking this finger on objects around her home and around town.  The wound has not healed despite aggressive wound care.  She presents today for amputation of the right middle finger through the PIP joint..  Patient denies any changes to their medical history or new systemic symptoms today.    Past Medical History:  Diagnosis Date   Acute bacterial bronchitis 05/16/2022   in [place]   Anemia    hx   Arthritis    Charcot's joint of right foot    Hx - DM 2   Depression 02/21/2018   patient denies this dx - pt states that she uses Cymbalta  for pain mgmt along with Voltaren    Diabetes mellitus without complication (HCC)    type 2   DOE (dyspnea on exertion) 07/12/2017   FUO (fever of unknown origin) 07/12/2017   Gastroenteritis 04/2016   GERD (gastroesophageal reflux disease)    Headache(784.0)    hormonal headache - no problems since menopause   Heart murmur    HLD (hyperlipidemia)    crestor    Low back pain 07/12/2017   Neuromuscular disorder (HCC)    neuropathy - right foot and hands   Obese    Pleuritic chest pain 07/12/2017   Pneumonia    hx 20+ years ago   RSV infection 03/2022   resolved   Sleep apnea    +cpap   Ulcer of foot (HCC)    toe ulcer greater than a year ago   Past Surgical History:  Procedure Laterality Date   AMPUTATION Right 06/11/2017   Procedure: RIGHT LONG FINGER AMPUTATION DIGIT;  Surgeon: Sebastian Lenis, MD;  Location: [facility] OR;  Service: Orthopedics;  Laterality: Right;   AMPUTATION Right 02/22/2018   Procedure: AMPUTATION RIGHT INDEX FINGER;   Surgeon: Lorretta Dess, MD;  Location: [facility] ORS;  Service: Plastics;  Laterality: Right;   AMPUTATION Left 07/02/2022   Procedure: AMPUTATION OF LEFT MIDDLE FINGER;  Surgeon: Romona Harari, MD;  Location: [facility] OR;  Service: Orthopedics;  Laterality: Left;   AMPUTATION Right 11/05/2022   Procedure: Right ring finger amputation;  Surgeon: Romona Harari, MD;  Location: [facility] OR;  Service: Orthopedics;  Laterality: Right;  local 60   AMPUTATION Left 01/14/2023   Procedure: Left ring finger amputation;  Surgeon: Romona Harari, MD;  Location: [facility] OR;  Service: Orthopedics;  Laterality: Left;  local 60   AMPUTATION Left 01/25/2023   Procedure: AMPUTATION OF LEFT RING FINGER;  Surgeon: Romona Harari, MD;  Location: [facility] OR;  Service: Orthopedics;  Laterality: Left;  local 30 room 3   ANKLE FUSION Left 08/03/2014   Procedure: Left Tibiocalcaneal Fusion;  Surgeon: Jerona Harden GAILS, MD;  Location: [facility] OR;  Service: Orthopedics;  Laterality: Left;   APPLICATION OF WOUND VAC Left 08/03/2014   Procedure: APPLICATION OF WOUND VAC;  Surgeon: Jerona Harden GAILS, MD;  Location: [facility] OR;  Service: Orthopedics;  Laterality: Left;   BELOW KNEE LEG AMPUTATION Left 10/08/2017   in [place]   I & D EXTREMITY Left 08/01/2020   Procedure: IRRIGATION AND  DEBRIDEMENT EXTREMITY,PARTIAL AMPUTATION LEFT INDEX FINGER;  Surgeon: Carolee Lynwood JINNY DOUGLAS, MD;  Location: [facility];  Service: Orthopedics;  Laterality: Left;   I & D EXTREMITY Left 08/06/2020   Procedure: IRRIGATION AND DEBRIDEMENT EXTREMITY;  Surgeon: Carolee Lynwood JINNY DOUGLAS, MD;  Location: [facility];  Service: Orthopedics;  Laterality: Left;   I & D KNEE WITH POLY EXCHANGE Right 01/31/2013   Procedure: IRRIGATION AND DEBRIDEMENT KNEE WITH POLY EXCHANGE possible Antibiotic Spacer;  Surgeon: Maude LELON Right, MD;  Location: [facility] OR;  Service: Orthopedics;  Laterality: Right;   INCISION AND DRAINAGE Right 01/31/2013   ANTIBIOTIC SPACER  RIGHT KNEE  DR [facility]    INCISION AND  DRAINAGE ABSCESS Left 08/03/2020   Procedure: INCISION AND DEBRIDEMENT LEFT INDEX FINGER;  Surgeon: Carolee Lynwood JINNY DOUGLAS, MD;  Location: [facility];  Service: Orthopedics;  Laterality: Left;   KNEE ARTHROSCOPY Left    MRI  04/07/2019   in [place]   ORIF ANKLE FRACTURE Left 03/16/2014   Procedure: Foot Excision Charcot Collapse,  Internal Fixation;  Surgeon: Jerona Harden GAILS, MD;  Location: [facility] OR;  Service: Orthopedics;  Laterality: Left;   REPLACEMENT TOTAL KNEE Left    TOTAL KNEE ARTHROPLASTY  06/23/2011   Procedure: RIGHT TOTAL KNEE ARTHROPLASTY;  Surgeon: Maude LELON Right, MD;  Location: [facility] OR;  Service: Orthopedics;  Laterality: Right;  RIGHT TOTAL KNEE REPLACEMENT   Social History   Socioeconomic History   Marital status: Married    Spouse name: Not on file   Number of children: Not on file   Years of education: Not on file   Highest education level: Not on file  Occupational History   Not on file  Tobacco Use   Smoking status: Never   Smokeless tobacco: Never  Vaping Use   Vaping status: Never Used  Substance and Sexual Activity   Alcohol use: Yes    Comment: occ a mixed drink/socially   Drug use: No   Sexual activity: Yes    Birth control/protection: Post-menopausal  Other Topics Concern   Not on file  Social History Narrative   Not on file   Social Drivers of Health   Financial Resource Strain: Low Risk  (05/04/2023)   [facility]   Overall Financial Resource Strain (CARDIA)    Difficulty of Paying Living Expenses: Not hard at all  Food Insecurity: No Food Insecurity (05/04/2023)   [facility]   Hunger Vital Sign    Within the past 12 months, you worried that your food would run out before you got the money to buy more.: Never true    Within the past 12 months, the food you bought just didn't last and you didn't have money to get more.: Never true  Transportation Needs: No Transportation Needs (05/04/2023)   Received from [facility] - Transportation    Lack of Transportation (Medical): No    Lack of Transportation (Non-Medical): No  Physical Activity: Insufficiently Active (08/02/2022)   Received from [facility]   Exercise Vital Sign    On average, how many days per week do you engage in moderate to strenuous exercise (like a brisk walk)?: 3 days    On average, how many minutes do you engage in exercise at this level?: 20 min  Stress: No Stress Concern Present (08/02/2022)   Received from [facility] - Occupational Stress Questionnaire    Feeling of Stress : Not  at all  Social Connections: Moderately Integrated (08/02/2022)   Received from [facility]   Social Network    How would you rate your social network (family, work, friends)?: Adequate participation with social networks   Family History  Problem Relation Age of Onset   Anesthesia problems Father    Stroke Father    Hypotension Neg Hx    Malignant hyperthermia Neg Hx    Pseudochol deficiency Neg Hx    - negative except otherwise stated in the family history section No Known Allergies Prior to Admission medications   Medication Sig Start Date End Date Taking? Authorizing Provider  dapagliflozin propanediol (FARXIGA) 10 MG TABS tablet Take 10 mg by mouth in the morning.   Yes [provider]  gabapentin  (NEURONTIN ) 600 MG tablet Take 600 mg by mouth See admin instructions. Take 1 tablet (600 mg) by mouth scheduled twice daily (morning & night), may take an additional dose at noon if needed for pain. 04/23/19  Yes [provider]  GLUCOSAMINE CHONDROITIN MSM PO Take 2 tablets by mouth in the morning.   Yes [provider]  Insulin  Glargine (LANTUS  Ray) Inject into the skin. 20 units bed   Yes [provider]  insulin  lispro (HUMALOG  KWIKPEN) 100 UNIT/ML KwikPen Inject 20-25 Units into the skin with breakfast, with lunch, and with evening meal.   Yes [provider]  metFORMIN (GLUCOPHAGE) 1000 MG tablet Take 1,000 mg by mouth 2 (two) times daily.   Yes [provider]  Multiple Vitamin (MULTIVITAMIN WITH MINERALS) TABS Take 1 tablet by mouth in the morning.   Yes [provider]  oxybutynin  (DITROPAN -XL) 10 MG 24 hr tablet Take 10 mg by mouth in the morning.   Yes [provider]  pantoprazole  (PROTONIX ) 40 MG tablet Take 40 mg by mouth daily before breakfast. 05/13/22  Yes [provider]  rosuvastatin  (CRESTOR ) 20 MG tablet Take 20 mg by mouth at bedtime.   Yes [provider]  Semaglutide, 1 MG/DOSE, (OZEMPIC, 1 MG/DOSE,) 2 MG/1.5ML SOPN Inject into the skin.   Yes [provider]  Blood Glucose Monitoring Suppl DEVI 1 each by Does not apply route 3 (three) times daily. May dispense any manufacturer covered by patient's insurance. 07/06/22   Dahal, Chapman, MD  collagenase (SANTYL) 250 UNIT/GM ointment Apply 1 Application topically at bedtime.    [provider]  Glucose Blood (BLOOD GLUCOSE TEST STRIPS) STRP 1 each by Does not apply route 3 (three) times daily. Use as directed to check blood sugar. May dispense any manufacturer covered by patient's insurance and fits patient's device. 07/06/22   Arlice Chapman, MD  Insulin  Pen Needle (PEN NEEDLES) 31G X 5 MM MISC 1 each by Does not apply route 3 (three) times daily. May dispense any manufacturer covered by patient's insurance. 07/06/22   Arlice Chapman, MD  Lancet Device MISC 1 each by Does not apply route 3 (three) times daily. May dispense any manufacturer covered by patient's insurance. 07/06/22   Arlice Chapman, MD  Lancets MISC 1 each by Does not apply route 3 (three) times daily. Use as directed to check blood sugar. May dispense any manufacturer covered by patient's insurance and fits patient's device. 07/06/22   Arlice Chapman, MD   DG MINI C-ARM IMAGE ONLY Result Date: 12/01/2023 There is no interpretation for this exam.  This order is for images  obtained during a surgical procedure.  Please See Surgeries Tab for more information regarding the procedure.   -  Positive ROS: All other systems have been reviewed and were otherwise negative with the exception of those mentioned in the HPI and as above.  Physical Exam: General: No acute distress, resting comfortably Cardiovascular: BUE warm and well perfused, normal rate Respiratory: Normal WOB on RA Skin: Warm and dry Neurologic: Sensation intact distally Psychiatric: Patient is at baseline mood and affect  Right upper Extremity  She is status post amputation of all fingers on this hand at various levels.  She is status post amputation of this right middle finger through the DIP joint.  She has a nonhealing wound at the volar aspect of this finger.  There is some granulation tissue in the wound bed.  There is scant drainage.  There is minimal swelling and no erythema.  All of her fingers are pink and well-perfused with brisk capillary refill.   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  This has not healed despite aggressive wound care.  She requested amputation of this middle finger given the continuous pain, persistent injury to this finger, and nonhealing wound.  Plan: OR today for amputation of the right middle finger through the PIP joint.. We again reviewed the risks of surgery which include bleeding, infection, damage to neurovascular structures, persistent symptoms, delayed wound healing, persistent pain or swelling, need for additional surgery.  Informed consent was signed.  All questions were answered.   Bebe Galla, M.D. [facility] 10:58 AM

## 2023-12-01 NOTE — Anesthesia Preprocedure Evaluation (Signed)
 Anesthesia Evaluation  Patient identified by MRN, date of birth, ID band Patient awake    Reviewed: Allergy & Precautions, NPO status , Patient's Chart, lab work & pertinent test results, reviewed documented beta blocker date and time   History of Anesthesia Complications Negative for: history of anesthetic complications  Airway Mallampati: II  TM Distance: >3 FB     Dental no notable dental hx.    Pulmonary sleep apnea , pneumonia, neg recent URI   breath sounds clear to auscultation       Cardiovascular (-) angina + DOE  (-) CAD and (-) Past MI + Valvular Problems/Murmurs  Rhythm:Regular Rate:Normal     Neuro/Psych  Headaches, neg Seizures PSYCHIATRIC DISORDERS  Depression     Neuromuscular disease    GI/Hepatic ,GERD  ,,  Endo/Other  diabetes, Type 2    Renal/GU CRFRenal disease     Musculoskeletal  (+) Arthritis ,    Abdominal   Peds  Hematology  (+) Blood dyscrasia, anemia   Anesthesia Other Findings   Reproductive/Obstetrics                              Anesthesia Physical Anesthesia Plan  ASA: 3  Anesthesia Plan: MAC   Post-op Pain Management:    Induction:   PONV Risk Score and Plan: 2 and Ondansetron  and Propofol  infusion  Airway Management Planned: Natural Airway and Simple Face Mask  Additional Equipment:   Intra-op Plan:   Post-operative Plan: Extubation in OR  Informed Consent: I have reviewed the patients History and Physical, chart, labs and discussed the procedure including the risks, benefits and alternatives for the proposed anesthesia with the patient or authorized representative who has indicated his/her understanding and acceptance.     Dental advisory given  Plan Discussed with: CRNA  Anesthesia Plan Comments:         Anesthesia Quick Evaluation

## 2023-12-01 NOTE — OR Nursing (Signed)
 Amputated finger discarder per MD instruction.

## 2023-12-01 NOTE — Op Note (Signed)
   Date of Surgery: 12/01/2023  INDICATIONS: Patient is a 58 y.o.-year-old female with a right middle finger wound that has not healed despite diligent wound care.  Patient his a history of diabetes with vascular disease requiring amputation of nearly all of her fingers.  She presents today for amputation of her right middle finger.  Risks, benefits, and alternatives to surgery were again discussed with the patient in the preoperative area. The patient wishes to proceed with surgery.  Informed consent was signed after our discussion.   PREOPERATIVE DIAGNOSIS:  Nonhealing wound at volar aspect of right middle finger  POSTOPERATIVE DIAGNOSIS: Same.  PROCEDURE:  Right middle finger amputation through PIP joint   SURGEON: Carlin Galla, M.D.  ASSIST: None  ANESTHESIA:  Local, MAC  IV FLUIDS AND URINE: See anesthesia.  ESTIMATED BLOOD LOSS: <5 mL.  IMPLANTS: * No implants in log *   DRAINS: None  COMPLICATIONS: None noted  DESCRIPTION OF PROCEDURE: The patient was met in the preoperative holding area where the surgical site was marked and the informed consent form was signed.  The patient was then brought back to the operating room and remained on the stretcher.  A hand table was placed adjacent to the operative extremity and locked into place.  A formal timeout was performed to confirm that this was the correct patient, surgical side, surgical site, and surgical procedure.  All were present and in agreement. Following formal timeout, a local block was performed using 10 mL of 0.25% plain marcaine .  The right upper extremity was then prepped and draped in the usual and sterile fashion.   Following formal timeout, 1/4 inch Penrose drain was then wrapped around the base of the index finger, pulled taut, and clamped with a hemostat.  A fishmouth type incision was designed at the middle finger around the level of the PIP joint.  Full-thickness skin flaps were elevated.  The extensor mechanism  was divided sharply.  The collateral ligaments were divided.  The flexor tendons were identified and also divided.  The finger was disarticulated through the PIP joint.  A rongeur was used to remove the cartilage from the head of the proximal phalanx to allow for a tension-free closure of the wound.  Blunt dissection was used to identify the radial and ulnar neurovascular bundles.  The digital nerves were identified and traction neurectomies performed.  The small digital arteries were coagulated with bipolar cautery.  The wound was then  thoroughly irrigated with copious sterile saline.  The wound was then closed using a combination of 4-0 nylon suture and 4-0 Vicryl Rapide suture.  The tourniquet was deflated.  The fingertip was pink and well-perfused.  The wound was then cleaned and dressed with Xeroform, bacitracin  ointment, 4 x 4's, and an Ace wrap.     The patient was reversed from sedation.  All counts were correct x 2 at the end of the procedure.  The patient was then taken to the PACU in stable condition.   POSTOPERATIVE PLAN: She will be discharged to home with appropriate pain medication and discharge instructions.  I will see her back in the office in 10 to 14 days for her first postop visit.  Carlin Galla, MD 11:46 AM

## 2023-12-01 NOTE — Transfer of Care (Signed)
 Immediate Anesthesia Transfer of Care Note  Patient: [name]  Procedure(s) Performed: AMPUTATION, FINGER (Right: Middle Finger)  Patient Location: PACU  Anesthesia Type:MAC  Level of Consciousness: awake, alert , and oriented  Airway & Oxygen Therapy: Patient Spontanous Breathing and Patient connected to face mask oxygen  Post-op Assessment: Report given to RN and Post -op Vital signs reviewed and stable  Post vital signs: Reviewed and stable  Last Vitals:  Vitals Value Taken Time  BP 96/55 (66)   Temp    Pulse 75 12/01/23 11:48  Resp 21   SpO2 96 % 12/01/23 11:48  Vitals shown include unfiled device data.  Last Pain:  Vitals:   12/01/23 0928  TempSrc: Temporal  PainSc: 0-No pain      Patients Stated Pain Goal: 4 (12/01/23 9071)  Complications: No notable events documented.

## 2023-12-02 ENCOUNTER — Encounter (HOSPITAL_BASED_OUTPATIENT_CLINIC_OR_DEPARTMENT_OTHER): Payer: Self-pay | Admitting: Orthopedic Surgery

## 2023-12-08 DIAGNOSIS — U071 COVID-19: Secondary | ICD-10-CM | POA: Diagnosis not present

## 2023-12-11 DIAGNOSIS — G4733 Obstructive sleep apnea (adult) (pediatric): Secondary | ICD-10-CM | POA: Diagnosis not present

## 2023-12-15 DIAGNOSIS — E1122 Type 2 diabetes mellitus with diabetic chronic kidney disease: Secondary | ICD-10-CM | POA: Diagnosis not present

## 2023-12-15 DIAGNOSIS — E1169 Type 2 diabetes mellitus with other specified complication: Secondary | ICD-10-CM | POA: Diagnosis not present

## 2023-12-15 DIAGNOSIS — Z794 Long term (current) use of insulin: Secondary | ICD-10-CM | POA: Diagnosis not present

## 2023-12-15 DIAGNOSIS — N1831 Chronic kidney disease, stage 3a: Secondary | ICD-10-CM | POA: Diagnosis not present

## 2023-12-15 DIAGNOSIS — N183 Chronic kidney disease, stage 3 unspecified: Secondary | ICD-10-CM | POA: Diagnosis not present

## 2023-12-15 DIAGNOSIS — G4733 Obstructive sleep apnea (adult) (pediatric): Secondary | ICD-10-CM | POA: Diagnosis not present

## 2023-12-15 DIAGNOSIS — E042 Nontoxic multinodular goiter: Secondary | ICD-10-CM | POA: Diagnosis not present

## 2023-12-15 DIAGNOSIS — E1140 Type 2 diabetes mellitus with diabetic neuropathy, unspecified: Secondary | ICD-10-CM | POA: Diagnosis not present

## 2023-12-15 DIAGNOSIS — I129 Hypertensive chronic kidney disease with stage 1 through stage 4 chronic kidney disease, or unspecified chronic kidney disease: Secondary | ICD-10-CM | POA: Diagnosis not present

## 2023-12-15 DIAGNOSIS — E785 Hyperlipidemia, unspecified: Secondary | ICD-10-CM | POA: Diagnosis not present

## 2023-12-15 DIAGNOSIS — I10 Essential (primary) hypertension: Secondary | ICD-10-CM | POA: Diagnosis not present

## 2023-12-23 DIAGNOSIS — T8131XA Disruption of external operation (surgical) wound, not elsewhere classified, initial encounter: Secondary | ICD-10-CM | POA: Diagnosis not present

## 2023-12-23 DIAGNOSIS — T8781 Dehiscence of amputation stump: Secondary | ICD-10-CM | POA: Diagnosis not present

## 2024-01-04 DIAGNOSIS — I517 Cardiomegaly: Secondary | ICD-10-CM | POA: Diagnosis not present

## 2024-01-04 DIAGNOSIS — I350 Nonrheumatic aortic (valve) stenosis: Secondary | ICD-10-CM | POA: Diagnosis not present

## 2024-01-04 DIAGNOSIS — E785 Hyperlipidemia, unspecified: Secondary | ICD-10-CM | POA: Diagnosis not present

## 2024-01-04 DIAGNOSIS — I10 Essential (primary) hypertension: Secondary | ICD-10-CM | POA: Diagnosis not present

## 2024-01-11 DIAGNOSIS — G4733 Obstructive sleep apnea (adult) (pediatric): Secondary | ICD-10-CM | POA: Diagnosis not present

## 2024-02-09 DIAGNOSIS — N183 Chronic kidney disease, stage 3 unspecified: Secondary | ICD-10-CM | POA: Diagnosis not present

## 2024-02-09 DIAGNOSIS — I129 Hypertensive chronic kidney disease with stage 1 through stage 4 chronic kidney disease, or unspecified chronic kidney disease: Secondary | ICD-10-CM | POA: Diagnosis not present

## 2024-02-09 DIAGNOSIS — Z89512 Acquired absence of left leg below knee: Secondary | ICD-10-CM | POA: Diagnosis not present

## 2024-02-09 DIAGNOSIS — G894 Chronic pain syndrome: Secondary | ICD-10-CM | POA: Diagnosis not present

## 2024-02-09 DIAGNOSIS — G4733 Obstructive sleep apnea (adult) (pediatric): Secondary | ICD-10-CM | POA: Diagnosis not present

## 2024-02-09 DIAGNOSIS — E118 Type 2 diabetes mellitus with unspecified complications: Secondary | ICD-10-CM | POA: Diagnosis not present

## 2024-02-09 DIAGNOSIS — M14671 Charcot's joint, right ankle and foot: Secondary | ICD-10-CM | POA: Diagnosis not present

## 2024-02-09 DIAGNOSIS — I10 Essential (primary) hypertension: Secondary | ICD-10-CM | POA: Diagnosis not present

## 2024-02-09 DIAGNOSIS — E1122 Type 2 diabetes mellitus with diabetic chronic kidney disease: Secondary | ICD-10-CM | POA: Diagnosis not present

## 2024-02-09 DIAGNOSIS — Z0000 Encounter for general adult medical examination without abnormal findings: Secondary | ICD-10-CM | POA: Diagnosis not present

## 2024-02-14 ENCOUNTER — Ambulatory Visit
Admission: RE | Admit: 2024-02-14 | Discharge: 2024-02-14 | Disposition: A | Discharge status: Home / Self Care | Source: Ambulatory Visit | Attending: Podiatry | Admitting: Podiatry

## 2024-02-14 ENCOUNTER — Other Ambulatory Visit: Payer: Self-pay | Admitting: Podiatry

## 2024-02-14 ENCOUNTER — Encounter: Payer: Self-pay | Admitting: Podiatry

## 2024-02-14 DIAGNOSIS — L97312 Non-pressure chronic ulcer of right ankle with fat layer exposed: Secondary | ICD-10-CM | POA: Diagnosis not present

## 2024-02-14 DIAGNOSIS — E11622 Type 2 diabetes mellitus with other skin ulcer: Secondary | ICD-10-CM

## 2024-02-14 DIAGNOSIS — G5781 Other specified mononeuropathies of right lower limb: Secondary | ICD-10-CM | POA: Diagnosis not present

## 2024-02-14 DIAGNOSIS — M792 Neuralgia and neuritis, unspecified: Secondary | ICD-10-CM | POA: Diagnosis not present

## 2024-02-14 DIAGNOSIS — M6788 Other specified disorders of synovium and tendon, other site: Secondary | ICD-10-CM | POA: Diagnosis not present

## 2024-02-14 DIAGNOSIS — T8484XA Pain due to internal orthopedic prosthetic devices, implants and grafts, initial encounter: Secondary | ICD-10-CM | POA: Diagnosis not present

## 2024-02-14 NOTE — Progress Notes (Signed)
  Podiatry Progress Note   [name] is a 58 y.o. female from [place] [id number] referred by Dr. Arch.  SUBJECTIVE:    Follow-up for ongoing management of lateral right ankle ulcer, pain and residual deformity.  Never had her limited hardware removal completed, sural nerve investigated, orperoneal tendons repaired.  This is a long and complicating story but now has been open for over a year.  Would like to now move forward.  Stable from a medical standpoint with optimized comorbidities and stable glycemic levels.  She is status post distant ankle and subtalar fusion revision with broken TTC nail.  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  There is most focal tenderness both subjectively and objectively along the lateral ankle peroneal tendon path and retromalleolar sural nerve region.  No sympathetic or vasomotor changes appreciated.  Arterial supply adequate.   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, with fat layer exposed (CMS/HHS-HCC)  (primary encounter diagnosis) Plan: CT ankle right without contrast  Sural neuritis, right  Painful orthopaedic hardware () Plan: CT ankle right without contrast  Peroneal tendinosis, right Plan: CT ankle right without contrast  Morbid obesity due to excess calories (CMS-HCC)   PLAN:   Although at risk of infection and had a recent additional middle finger partial loss from tendon rupture she is now amenable and adequate candidate for attempt at surgical revision with  possibly some more aggressive hardware removal pending CT but also serial investigation along with peroneal tendons.  Ulcer should be excised and closed through either direct means or with local flap.  The risk complications alternatives outcomes and course are reviewed and understood with all questions answered and no guarantees given.  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 The Patient was instructed to call with any questions or concerns.  -Patient educated regarding diagnosis, treatment plan, medications and possible side effects. -Patient educated regarding emergent signs and symptoms and if develops, should notify our office immediately or seek emergent medical attention and/or go the closest emergency room. -Patient voiced understanding and agreement with treatment plan. This note was generated in part with voice recognition software and I apologize for any typographical errors that were not detected and corrected.

## 2024-03-21 DIAGNOSIS — G988 Other disorders of nervous system: Secondary | ICD-10-CM | POA: Diagnosis not present

## 2024-03-21 DIAGNOSIS — E11622 Type 2 diabetes mellitus with other skin ulcer: Secondary | ICD-10-CM | POA: Diagnosis not present

## 2024-03-21 DIAGNOSIS — G5781 Other specified mononeuropathies of right lower limb: Secondary | ICD-10-CM | POA: Diagnosis not present

## 2024-03-21 DIAGNOSIS — G8918 Other acute postprocedural pain: Secondary | ICD-10-CM | POA: Diagnosis not present

## 2024-03-21 DIAGNOSIS — M67873 Other specified disorders of tendon, right ankle and foot: Secondary | ICD-10-CM | POA: Diagnosis not present

## 2024-03-21 DIAGNOSIS — L97312 Non-pressure chronic ulcer of right ankle with fat layer exposed: Secondary | ICD-10-CM | POA: Diagnosis not present

## 2024-03-21 DIAGNOSIS — T8484XA Pain due to internal orthopedic prosthetic devices, implants and grafts, initial encounter: Secondary | ICD-10-CM | POA: Diagnosis not present

## 2024-03-31 DIAGNOSIS — M6788 Other specified disorders of synovium and tendon, other site: Secondary | ICD-10-CM | POA: Diagnosis not present

## 2024-03-31 DIAGNOSIS — Z48817 Encounter for surgical aftercare following surgery on the skin and subcutaneous tissue: Secondary | ICD-10-CM | POA: Diagnosis not present

## 2024-03-31 DIAGNOSIS — Z981 Arthrodesis status: Secondary | ICD-10-CM | POA: Diagnosis not present

## 2024-03-31 DIAGNOSIS — L97911 Non-pressure chronic ulcer of unspecified part of right lower leg limited to breakdown of skin: Secondary | ICD-10-CM | POA: Diagnosis not present

## 2024-03-31 DIAGNOSIS — G5781 Other specified mononeuropathies of right lower limb: Secondary | ICD-10-CM | POA: Diagnosis not present

## 2024-04-07 DIAGNOSIS — Z89512 Acquired absence of left leg below knee: Secondary | ICD-10-CM | POA: Diagnosis not present

## 2024-04-11 DIAGNOSIS — G4733 Obstructive sleep apnea (adult) (pediatric): Secondary | ICD-10-CM | POA: Diagnosis not present

## 2024-04-19 DIAGNOSIS — Z981 Arthrodesis status: Secondary | ICD-10-CM | POA: Diagnosis not present

## 2024-04-19 DIAGNOSIS — E118 Type 2 diabetes mellitus with unspecified complications: Secondary | ICD-10-CM | POA: Diagnosis not present

## 2024-04-19 DIAGNOSIS — M6788 Other specified disorders of synovium and tendon, other site: Secondary | ICD-10-CM | POA: Diagnosis not present

## 2024-04-19 DIAGNOSIS — E11622 Type 2 diabetes mellitus with other skin ulcer: Secondary | ICD-10-CM | POA: Diagnosis not present

## 2024-04-19 DIAGNOSIS — G5781 Other specified mononeuropathies of right lower limb: Secondary | ICD-10-CM | POA: Diagnosis not present

## 2024-04-19 DIAGNOSIS — L97911 Non-pressure chronic ulcer of unspecified part of right lower leg limited to breakdown of skin: Secondary | ICD-10-CM | POA: Diagnosis not present

## 2024-04-19 DIAGNOSIS — L97312 Non-pressure chronic ulcer of right ankle with fat layer exposed: Secondary | ICD-10-CM | POA: Diagnosis not present
